# Patient Record
Sex: Male | Born: 1955 | Race: Black or African American | Hispanic: No | Marital: Married | Smoking: Former smoker
Health system: Southern US, Community
[De-identification: ages and names within clinical notes are randomized; demographics above are authoritative.]

## PROBLEM LIST (undated history)

## (undated) DIAGNOSIS — I255 Ischemic cardiomyopathy: Secondary | ICD-10-CM

## (undated) DIAGNOSIS — E669 Obesity, unspecified: Secondary | ICD-10-CM

## (undated) DIAGNOSIS — Z720 Tobacco use: Secondary | ICD-10-CM

## (undated) DIAGNOSIS — M109 Gout, unspecified: Secondary | ICD-10-CM

## (undated) DIAGNOSIS — I119 Hypertensive heart disease without heart failure: Secondary | ICD-10-CM

## (undated) DIAGNOSIS — I779 Disorder of arteries and arterioles, unspecified: Secondary | ICD-10-CM

## (undated) DIAGNOSIS — I739 Peripheral vascular disease, unspecified: Secondary | ICD-10-CM

## (undated) DIAGNOSIS — E785 Hyperlipidemia, unspecified: Secondary | ICD-10-CM

## (undated) DIAGNOSIS — N189 Chronic kidney disease, unspecified: Secondary | ICD-10-CM

## (undated) DIAGNOSIS — I5042 Chronic combined systolic (congestive) and diastolic (congestive) heart failure: Secondary | ICD-10-CM

## (undated) DIAGNOSIS — J449 Chronic obstructive pulmonary disease, unspecified: Secondary | ICD-10-CM

## (undated) DIAGNOSIS — I4819 Other persistent atrial fibrillation: Secondary | ICD-10-CM

## (undated) DIAGNOSIS — E119 Type 2 diabetes mellitus without complications: Secondary | ICD-10-CM

## (undated) DIAGNOSIS — I10 Essential (primary) hypertension: Secondary | ICD-10-CM

## (undated) DIAGNOSIS — I639 Cerebral infarction, unspecified: Secondary | ICD-10-CM

## (undated) DIAGNOSIS — I2510 Atherosclerotic heart disease of native coronary artery without angina pectoris: Secondary | ICD-10-CM

## (undated) HISTORY — DX: Gout, unspecified: M10.9

## (undated) HISTORY — DX: Chronic kidney disease, unspecified: N18.9

## (undated) HISTORY — DX: Tobacco use: Z72.0

## (undated) HISTORY — DX: Obesity, unspecified: E66.9

## (undated) HISTORY — DX: Essential (primary) hypertension: I10

## (undated) HISTORY — DX: Chronic obstructive pulmonary disease, unspecified: J44.9

## (undated) HISTORY — DX: Hyperlipidemia, unspecified: E78.5

---

## 2007-07-01 HISTORY — PX: CARDIAC CATHETERIZATION: SHX172

## 2007-07-01 HISTORY — PX: CORONARY ANGIOPLASTY: SHX604

## 2008-06-30 HISTORY — PX: CARDIAC CATHETERIZATION: SHX172

## 2008-09-12 ENCOUNTER — Inpatient Hospital Stay: Payer: Self-pay | Admitting: Internal Medicine

## 2010-10-16 ENCOUNTER — Inpatient Hospital Stay: Payer: Self-pay | Admitting: Surgery

## 2010-10-18 LAB — PATHOLOGY REPORT

## 2012-01-14 ENCOUNTER — Inpatient Hospital Stay: Payer: Self-pay | Admitting: Internal Medicine

## 2012-01-14 DIAGNOSIS — R079 Chest pain, unspecified: Secondary | ICD-10-CM

## 2012-01-14 DIAGNOSIS — I369 Nonrheumatic tricuspid valve disorder, unspecified: Secondary | ICD-10-CM

## 2012-01-14 LAB — COMPREHENSIVE METABOLIC PANEL
Albumin: 3.7 g/dL (ref 3.4–5.0)
Anion Gap: 10 (ref 7–16)
BUN: 12 mg/dL (ref 7–18)
Calcium, Total: 9.2 mg/dL (ref 8.5–10.1)
Co2: 29 mmol/L (ref 21–32)
EGFR (African American): >60
EGFR (Non-African Amer.): >60
Glucose: 89 mg/dL (ref 65–99)
Osmolality: 282 (ref 275–301)
Potassium: 3.7 mmol/L (ref 3.5–5.1)
Sodium: 142 mmol/L (ref 136–145)

## 2012-01-14 LAB — CBC
HCT: 41.8 % (ref 40.0–52.0)
HGB: 13.6 g/dL (ref 13.0–18.0)
Platelet: 155 10*3/uL (ref 150–440)
RBC: 4.74 10*6/uL (ref 4.40–5.90)
RDW: 14.8 % — ABNORMAL HIGH (ref 11.5–14.5)
WBC: 6.4 10*3/uL (ref 3.8–10.6)

## 2012-01-14 LAB — PROTIME-INR: Prothrombin Time: 13.3 secs (ref 11.5–14.7)

## 2012-01-14 LAB — CK TOTAL AND CKMB (NOT AT ARMC)
CK, Total: 179 U/L (ref 35–232)
CK, Total: 158 U/L (ref 35–232)
CK-MB: 1.9 ng/mL (ref 0.5–3.6)
CK-MB: 2.2 ng/mL (ref 0.5–3.6)

## 2012-01-14 LAB — APTT: Activated PTT: 40.7 secs — ABNORMAL HIGH (ref 23.6–35.9)

## 2012-01-14 LAB — DRUG SCREEN, URINE
Benzodiazepine, Ur Scrn: NEGATIVE (ref ?–200)
Cannabinoid 50 Ng, Ur ~~LOC~~: NEGATIVE (ref ?–50)
Cocaine Metabolite,Ur ~~LOC~~: NEGATIVE (ref ?–300)
MDMA (Ecstasy)Ur Screen: NEGATIVE (ref ?–500)
Methadone, Ur Screen: NEGATIVE (ref ?–300)
Opiate, Ur Screen: NEGATIVE (ref ?–300)
Phencyclidine (PCP) Ur S: NEGATIVE (ref ?–25)
Tricyclic, Ur Screen: NEGATIVE (ref ?–1000)

## 2012-01-14 LAB — TROPONIN I
Troponin-I: 0.04 ng/mL
Troponin-I: 0.03 ng/mL

## 2012-01-14 LAB — URIC ACID: Uric Acid: 7.4 mg/dL — ABNORMAL HIGH (ref 3.5–7.2)

## 2012-01-15 LAB — LIPID PANEL
HDL Cholesterol: 54 mg/dL (ref 40–60)
Ldl Cholesterol, Calc: 152 mg/dL — ABNORMAL HIGH (ref 0–100)
VLDL Cholesterol, Calc: 21 mg/dL (ref 5–40)

## 2012-01-16 LAB — COMPREHENSIVE METABOLIC PANEL
Albumin: 3.4 g/dL (ref 3.4–5.0)
Anion Gap: 10 (ref 7–16)
BUN: 25 mg/dL — ABNORMAL HIGH (ref 7–18)
Bilirubin,Total: 0.3 mg/dL (ref 0.2–1.0)
Calcium, Total: 9.5 mg/dL (ref 8.5–10.1)
Chloride: 104 mmol/L (ref 98–107)
Creatinine: 1.26 mg/dL (ref 0.60–1.30)
EGFR (African American): >60
Potassium: 4.5 mmol/L (ref 3.5–5.1)
SGPT (ALT): 32 U/L
Total Protein: 7.7 g/dL (ref 6.4–8.2)

## 2012-01-16 LAB — CBC WITH DIFFERENTIAL/PLATELET
Basophil #: 0 10*3/uL (ref 0.0–0.1)
Basophil %: 0.1 %
Eosinophil %: 0 %
HCT: 42.5 % (ref 40.0–52.0)
HGB: 13.9 g/dL (ref 13.0–18.0)
Lymphocyte #: 1.1 10*3/uL (ref 1.0–3.6)
Lymphocyte %: 5.8 %
MCH: 28.9 pg (ref 26.0–34.0)
MCV: 88 fL (ref 80–100)
Monocyte %: 2.1 %
Neutrophil #: 17.9 10*3/uL — ABNORMAL HIGH (ref 1.4–6.5)
Neutrophil %: 92 %
RBC: 4.81 10*6/uL (ref 4.40–5.90)

## 2012-12-08 ENCOUNTER — Inpatient Hospital Stay: Payer: Self-pay | Admitting: Internal Medicine

## 2012-12-08 DIAGNOSIS — I10 Essential (primary) hypertension: Secondary | ICD-10-CM

## 2012-12-08 DIAGNOSIS — R079 Chest pain, unspecified: Secondary | ICD-10-CM

## 2012-12-08 LAB — TROPONIN I: Troponin-I: 0.09 ng/mL — ABNORMAL HIGH

## 2012-12-08 LAB — URINALYSIS, COMPLETE
Bilirubin,UR: NEGATIVE
Blood: NEGATIVE
Glucose,UR: NEGATIVE mg/dL (ref 0–75)
Ketone: NEGATIVE
Leukocyte Esterase: NEGATIVE
Nitrite: NEGATIVE
Ph: 5 (ref 4.5–8.0)
Protein: 100
RBC,UR: 2 /HPF (ref 0–5)
Specific Gravity: 1.028 (ref 1.003–1.030)
Squamous Epithelial: 2
WBC UR: 3 /HPF (ref 0–5)

## 2012-12-08 LAB — COMPREHENSIVE METABOLIC PANEL
Albumin: 3.6 g/dL (ref 3.4–5.0)
Alkaline Phosphatase: 143 U/L — ABNORMAL HIGH (ref 50–136)
BUN: 15 mg/dL (ref 7–18)
Calcium, Total: 9.2 mg/dL (ref 8.5–10.1)
Chloride: 104 mmol/L (ref 98–107)
Co2: 25 mmol/L (ref 21–32)
EGFR (African American): >60
EGFR (Non-African Amer.): >60
SGPT (ALT): 60 U/L (ref 12–78)
Total Protein: 7.5 g/dL (ref 6.4–8.2)

## 2012-12-08 LAB — CBC
HGB: 13.7 g/dL (ref 13.0–18.0)
MCV: 86 fL (ref 80–100)
RBC: 4.84 10*6/uL (ref 4.40–5.90)
RDW: 16.6 % — ABNORMAL HIGH (ref 11.5–14.5)
WBC: 10.1 10*3/uL (ref 3.8–10.6)

## 2012-12-08 LAB — CK TOTAL AND CKMB (NOT AT ARMC): CK-MB: 4.2 ng/mL — ABNORMAL HIGH (ref 0.5–3.6)

## 2012-12-08 LAB — DRUG SCREEN, URINE
Amphetamines, Ur Screen: NEGATIVE (ref ?–1000)
Barbiturates, Ur Screen: NEGATIVE (ref ?–200)
Benzodiazepine, Ur Scrn: NEGATIVE (ref ?–200)
Opiate, Ur Screen: NEGATIVE (ref ?–300)

## 2012-12-09 ENCOUNTER — Telehealth: Payer: Self-pay

## 2012-12-09 LAB — LIPID PANEL
Cholesterol: 191 mg/dL (ref 0–200)
HDL Cholesterol: 54 mg/dL (ref 40–60)
Ldl Cholesterol, Calc: 121 mg/dL — ABNORMAL HIGH (ref 0–100)
Triglycerides: 78 mg/dL (ref 0–200)
VLDL Cholesterol, Calc: 16 mg/dL (ref 5–40)

## 2012-12-09 NOTE — Telephone Encounter (Signed)
Message copied by [facility], Quintavius Niebuhr E on Thu Dec 09, 2012  2:24 PM ------      Message from: Christell Faith      Created: Thu Dec 09, 2012  1:47 PM      Regarding: TCM/ph       July 1 10:30 Dr. Mariah Milling ------

## 2012-12-09 NOTE — Telephone Encounter (Signed)
tcm

## 2012-12-09 NOTE — Telephone Encounter (Signed)
TCM #1 6/13 

## 2012-12-10 NOTE — Telephone Encounter (Signed)
Patient contacted regarding discharge from [facility] on 12/09/12.  Patient understands to follow up with provider Dr. Mariah Milling on 12/28/12 at 1030 at [facility]. Patient understands discharge instructions? yes Patient understands medications and regiment? yes Patient understands to bring all medications to this visit? yes  Pt non compliant with meds Says he has not gotten BP meds filled per d/c instructions Says he will get these filled at [facility] when he goes next week I explained he needs to go ahead and start these ASAP and offered to send in RX to local pharm He says these meds cost $3-4 each but are free at [place] and he has "no money right now" He understands risk and is willing to accept risk He will go to ER should he develop CP, sob, etc

## 2012-12-11 ENCOUNTER — Emergency Department: Payer: Self-pay | Admitting: Emergency Medicine

## 2012-12-11 LAB — CBC
HCT: 38.4 % — ABNORMAL LOW
HGB: 12.5 g/dL — ABNORMAL LOW
MCH: 27.8 pg
MCHC: 32.5 g/dL
MCV: 85 fL
Platelet: 167 x10 3/mm 3
RBC: 4.49 x10 6/mm 3
RDW: 16.5 % — ABNORMAL HIGH
WBC: 7.8 x10 3/mm 3

## 2012-12-11 LAB — COMPREHENSIVE METABOLIC PANEL WITH GFR
Albumin: 3.3 g/dL — ABNORMAL LOW
Alkaline Phosphatase: 134 U/L
Anion Gap: 5 — ABNORMAL LOW
BUN: 18 mg/dL
Bilirubin,Total: 0.6 mg/dL
Calcium, Total: 8.8 mg/dL
Chloride: 108 mmol/L — ABNORMAL HIGH
Co2: 28 mmol/L
Creatinine: 1.26 mg/dL
EGFR (African American): >60
EGFR (Non-African Amer.): >60
Glucose: 112 mg/dL — ABNORMAL HIGH
Osmolality: 284
Potassium: 4.2 mmol/L
SGOT(AST): 62 U/L — ABNORMAL HIGH
SGPT (ALT): 77 U/L
Sodium: 141 mmol/L
Total Protein: 6.7 g/dL

## 2012-12-11 LAB — CK TOTAL AND CKMB (NOT AT ARMC)
CK, Total: 126 U/L
CK-MB: 3.7 ng/mL — ABNORMAL HIGH

## 2012-12-11 LAB — TROPONIN I: Troponin-I: 0.09 ng/mL — ABNORMAL HIGH

## 2012-12-11 LAB — PRO B NATRIURETIC PEPTIDE: B-Type Natriuretic Peptide: 2826 pg/mL — ABNORMAL HIGH

## 2012-12-13 ENCOUNTER — Telehealth: Payer: Self-pay

## 2012-12-13 NOTE — Telephone Encounter (Signed)
See below

## 2012-12-13 NOTE — Telephone Encounter (Signed)
Per family member pt not in He has our phone # [phone]

## 2012-12-13 NOTE — Telephone Encounter (Signed)
Message copied by Marcelle Overlie on Mon Dec 13, 2012  8:35 AM ------      Message from: Lorine Bears A      Created: Sat Dec 11, 2012  4:20 PM       He went to the ED on Saturday with increased dyspnea and elevated BNP. He was discharged with addition of Lasix 40 mg daily and K-dur 20 meq daily. He needs to have BMP in 5 days and Follow up within 1 week (maybe with Alinda Money).  ------

## 2012-12-13 NOTE — Telephone Encounter (Signed)
"  pt not home" Will try again later

## 2012-12-16 ENCOUNTER — Other Ambulatory Visit: Payer: Self-pay

## 2012-12-16 DIAGNOSIS — I5021 Acute systolic (congestive) heart failure: Secondary | ICD-10-CM

## 2012-12-16 NOTE — Telephone Encounter (Signed)
Pt's family member informed of need for labs tomm She says she will give pt message and if he cannot make it she will call me back

## 2012-12-17 ENCOUNTER — Other Ambulatory Visit: Payer: Medicare Other

## 2012-12-20 ENCOUNTER — Telehealth: Payer: Self-pay

## 2012-12-20 NOTE — Telephone Encounter (Signed)
Remind pt he is due for bmp

## 2012-12-20 NOTE — Telephone Encounter (Signed)
Reminded pt he is due for BMP He will come today or tomm for this He confirms he is taking lasix and KCL as prescribed I attempted to schedule appt with [place] this week, but there are no openings, PA not here this week Pt has appt with [place] 7/1 for hospital f/u He will keep this appt. He will come for labs today/tomm and we will go from there Understanding verb

## 2012-12-20 NOTE — Telephone Encounter (Signed)
FYI

## 2012-12-21 ENCOUNTER — Ambulatory Visit (INDEPENDENT_AMBULATORY_CARE_PROVIDER_SITE_OTHER): Payer: Medicare Other

## 2012-12-21 DIAGNOSIS — I509 Heart failure, unspecified: Secondary | ICD-10-CM

## 2012-12-21 DIAGNOSIS — I5021 Acute systolic (congestive) heart failure: Secondary | ICD-10-CM

## 2012-12-22 LAB — BASIC METABOLIC PANEL
BUN: 16 mg/dL (ref 6–24)
Chloride: 105 mmol/L (ref 97–108)
Creatinine, Ser: 1.14 mg/dL (ref 0.76–1.27)
GFR calc Af Amer: 83 mL/min/{1.73_m2} (ref >59)
GFR calc non Af Amer: 72 mL/min/{1.73_m2} (ref >59)
Glucose: 124 mg/dL — ABNORMAL HIGH (ref 65–99)

## 2012-12-27 NOTE — Addendum Note (Signed)
Addended by: Festus Aloe on: 12/27/2012 09:17 AM   Modules accepted: Orders

## 2012-12-28 ENCOUNTER — Encounter: Payer: Medicare Other | Admitting: Cardiovascular Disease

## 2013-01-11 ENCOUNTER — Encounter: Payer: Medicare Other | Admitting: Cardiovascular Disease

## 2014-10-17 NOTE — Consult Note (Signed)
General Aspect 59 year old male with history of coronary artery disease with stent placement at [facility] in August 2009, hypertension uncontrolled, hyperlipidemia, and a smoker, increasing SOB x 5 to 6 weeks, chest discomfort. Cardiology was consulted for chest pain.  he hass had previous epsiodes of chest pain in 2010 with cardiac cath at [facility] at that time. He continues to smoking 1 ppd. He reports increasing SOB over one month with wheezing. Chest feels tight at times. Inhalers are not wokrin anymore. He stopped his advair, only taking combivent. No diaphoresis, no nausea/vomiting, no PND, orthopena    Present Illness . SOCIAL HISTORY: He smokes about 1 ppd.  Occasional alcohol use. He denies any illicit drug use.   FAMILY HISTORY: Positive for coronary artery disease. His mom died of a heart attack before the age of 58.   Physical Exam:   GEN well developed, well nourished, no acute distress, obese    HEENT red conjunctivae    NECK supple  No masses    RESP normal resp effort  wheezing  decreased BS b/l    ABD denies tenderness  soft    LYMPH negative neck    EXTR negative edema    SKIN normal to palpation    NEURO motor/sensory function intact    PSYCH alert, A+O to time, place, person, good insight   Review of Systems:   Subjective/Chief Complaint SOB, wheezing, chest is tight sometimes    General: No Complaints    Skin: No Complaints    ENT: No Complaints    Eyes: No Complaints    Neck: No Complaints    Respiratory: Short of breath  Wheezing    Cardiovascular: Dyspnea    Gastrointestinal: No Complaints    Genitourinary: No Complaints    Vascular: No Complaints    Musculoskeletal: No Complaints    Neurologic: No Complaints    Hematologic: No Complaints    Endocrine: No Complaints    Psychiatric: No Complaints    Review of Systems: All other systems were reviewed and found to be negative    Medications/Allergies Reviewed Medications/Allergies  reviewed     asthma:    Back Pain, Chronic:    Back Pain, Chronic:    Cardiac Cath: NO STENTS PLACED, 13-Sep-2008   Hypercholesterolemia:    HTN:    Carotid Stent Placement:   Home Medications: Medication Instructions Status  hydrochlorothiazide 25 mg oral tablet 0.5 tab(s) orally once a day for blood pressure Active  amlodipine 10 mg oral tablet 1 tab(s) orally once a day for blood pressure Active  Combivent 18 mcg-103 mcg/inh inhalation aerosol with adapter 2 puff(s) inhaled every 4 hours, As Needed for breathing Active  isosorbide mononitrate 60 mg oral tablet, extended release 1 tab(s) orally once a day (don't take cialis, viagra, or levitra while you are on this medication) Active  metoprolol tartrate 100 mg oral tablet 1 tab(s) orally 2 times a day for blood pressure Active  atorvastatin 40 mg oral tablet 0.5 tab(s) orally once a day (at bedtime) for cholesterol (replaces rosuvastatin) Active   EKG:   Interpretation EKG shows NSR with T wave ABN in V5, V6, I and AVL (seen before)    No Known Allergies:   Vital Signs/Nurse's Notes: **Vital Signs.:   17-Jul-13 08:12   Temperature Temperature (F) 98.5   Celsius 36.9   Pulse Pulse 92   Respirations Respirations 14   Systolic BP Systolic BP 197   Diastolic BP (mmHg) Diastolic BP (mmHg) 94  Mean BP 128   Pulse Ox % Pulse Ox % 100   Pulse Ox Activity Level  At rest   Oxygen Delivery Room Air/ 21 %   Pulse Ox Heart Rate 92     Impression 59 y/o [place] American male with hx of COPD, continued smoking 1 ppd, CAD and severe hypertension. Presented with 6 weeks hx of SOB, wheezing and recurrent chest pain.  BNP is slightly elevated. Troponin is elevated at 0.08. EKG is NSR with ST T abnormalities but no change compared to EKG of 4/ 2012.   1- SOB,  Suspect secondary to COPD exacerbation: No clinical signs of fluid overload. echo pending to confirm RVSP Very wheezy on exam. --May need continued nebs, steroids  2-   chest pain Cardiac enz negative so far EKG unchanged Could be Angina could be secondary to severe HTN and SOB from COPD If enz stay negative, consider outpt stress test (unable to currently treadmill or lexiscan) Hold heparin if next enz are negative  3- Severe HTN uncontrolled on meds, titrating  4-  CAD s/p stents asa, Cautious with b-blockers given copd exacerbation (bronchospasm)  5- copd/ Tobacco abuse ONLY ON COMBIVENT "ADVAIR DID NOT WORK"  6- Non compliance   Electronic Signatures: Julien Nordmann (MD)  (Signed 17-Jul-13 09:04)  Authored: General Aspect/Present Illness, History and Physical Exam, Review of System, Past Medical History, Home Medications, EKG , Allergies, Vital Signs/Nurse's Notes, Impression/Plan   Last Updated: 17-Jul-13 09:04 by Julien Nordmann (MD)

## 2014-10-17 NOTE — Discharge Summary (Signed)
PATIENT NAME:  [name], [name] MR#:  [id number] DATE OF BIRTH:  Nov 01, 1955  DATE OF ADMISSION:  01/14/2012 DATE OF DISCHARGE:  01/16/2012  ADMITTING DIAGNOSIS: Chronic obstructive pulmonary disease exacerbation.   DISCHARGE DIAGNOSES:  1. Acute respiratory failure.  2. Chronic obstructive pulmonary disease exacerbation. 3. Acute bronchitis.  4. Chest pain, likely chest wall pain versus gastroesophageal reflux disease. Cardiac evaluation is pending.  5. Normal echocardiogram.  6. Elevated troponin, but no cardiac injury per cardiology.  7. Malignant hypertension.  8. Tobacco abuse.  9. Gout. 10. Hyperlipidemia.  11. Obesity.  12. Constipation.  13. Intermittent nausea and vomiting, resolved.   DISCHARGE CONDITION: Stable.   DISCHARGE MEDICATIONS:  1. Hydrochlorothiazide 12.5 mg p.o. daily.  2. Amlodipine 10 mg p.o. daily.  3. Combivent 2 puffs every four hours as needed.  4. Atorvastatin 40 mg, half tablet, which is 20 mg p.o. at bedtime.  5. Aspirin 81 mg p.o. daily.  6. Plavix 75 mg p.o. daily.  7. Nitroglycerin 0.4 mg sublingually every five minutes as needed.  8. Indocin 60 mg p.o. daily.  9. Nicotine patch 7 mg topically daily.  10. Nicotine oral inhaler one cartridge every one hour as needed.  11. Allopurinol 300 mg p.o. daily.  12. Metoprolol 150 mg p.o. twice daily. This is a new dose.  13. Lisinopril 40 mg p.o. daily.  14. Zithromax 250 mg daily for four more days.  15. Maalox 30 mL every four hours as needed.  16. Clonidine 0.1 mg p.o. twice daily.  17. Zofran 4 mg p.o. every four hours as needed.  18. Percocet 5/325 mg 1-2 tablets every six hours as needed.  19. Advair Diskus 250/50, 1 puff twice daily.  20. Prednisone 60 mg p.o. on 01/17/2012, then taper x 10 mg until stopped.  DIET: Two-gram salt, low fat, low cholesterol.   PHYSICAL ACTIVITY LIMITATIONS: As tolerated.   REFERRAL: Home Health.  FOLLOWUP: [facility] week  after discharge.    CONSULTANTS:  1. Dr. Mariah MillingGollan, cardiology.  2. Dr. Kirke CorinArida, cardiology. 3. Child psychotherapistocial worker.   RADIOLOGIC STUDIES:  1. Chest x-ray PA and lateral 01/13/2012 showed no acute disease of the chest.  2. Abdomen, flat and erect x-ray on 01/15/2012 showed a small amount of free extraluminal gas in the right mid and upper abdomen. Bowel gas pattern suggests constipation. No evidence of obstruction.  If patient's clinical examination is worse and for possible perforated viscus, CT scanning would be useful according to the radiologist.  3. CT scan of abdomen and pelvis without contrast 01/15/2012 revealed no CT evidence of obstructive or inflammatory abnormalities. There is no evidence of intraperitoneal free air. Incidentally noted fat-containing umbilical hernia.   HISTORY OF PRESENT ILLNESS: The patient is a 59 year old African American male with past medical history significant for history of smoking and chronic obstructive pulmonary disease who presented to the hospital with complaints of 1 to 2 months of shortness of breath, also wheezing, worsening over the past few days, associated with some chest pains in the midsternal area. The pain is described as sharp pain. Also shortness of breath upon walking. On arrival to the Emergency Room, the patient's blood pressure was 197/94, respiratory rate 19, pulse 86, temperature 98.8. Oxygen saturation 96% on oxygen therapy. Physical exam revealed bilateral wheezing. EKG showed sinus rhythm at 100 beats per minute, incomplete right bundle branch block, poor progression of R waves in anterior chest leads, and ST-T wave abnormalities with T-wave inversions in  lateral leads. However, no significant change since prior EKG done in April 2012.   LABORATORY DATA:  Labs on 01/14/2012 showed elevated beta-type natriuretic peptide of 829. Otherwise BMP was unremarkable. The patient's liver enzymes were normal. Cardiac enzymes, first set showed troponin elevation  to 0.08. However, second set and third set were normal. Urine drug screen was negative. CBC was within normal limits. Coagulation panel was unremarkable. The patient's EKG was already mentioned.  Chest x-ray was also unremarkable.   HOSPITAL COURSE: The patient was admitted to the hospital for further evaluation. He was admitted because of his shortness of breath, wheezing, and elevated troponin. He was admitted to the unit with telemetry. He was started on heparin IV because of his chest pains and elevated troponin with concerns of possible acute coronary syndrome. However, when patient's second and third sets of troponin came back normal, it was felt that the patient's elevation of troponin was nonspecific and not related to acute coronary syndrome. The patient was evaluated by Dr. Mariah Milling and Dr. Kirke Corin and had an echocardiogram. Echocardiogram was performed on 01/14/2012. It showed left ventricular systolic function normal. Ejection fraction equal or more than 55%. Transmitral spectral Doppler flow pattern was suggestive of impaired left ventricular relaxation with moderate concentric left ventricular hypertrophy. Right ventricular systolic function was normal. Left atrium was mildly dilated. Right ventricular systolic pressure was also found to be normal.   The patient was started on steroids and inhalers and his condition improved. He was wheezing less, however still complaining of some discomfort in his chest. It was felt that the patient would benefit from stress test initially as inpatient. Stress test was ordered for him on 01/16/2012. However, the patient had some chest pains at night while he was lying in bed and the stress test was canceled. I discussed the patient's condition with Dr. Mariah Milling, cardiologist, who felt that the patient should undergo a stress test as an outpatient after chronic obstructive pulmonary disease exacerbation has resolved. Dr. Mariah Milling felt that the patient could not do a  treadmill because of his joint pains. However, LexiScan could cause bronchospasm and worsen his chronic obstructive pulmonary disease. Dobutamine would cause significant tachycardia and exacerbate shortness of breath. So he felt that postponing the stress test would be prudent. This was discussed with the patient. The patient is to continue beta blocker, aspirin, nitroglycerin intermittently as needed as well as his blood pressure medications to control his blood pressure according to Dr. Mariah Milling.   In regards to acute respiratory failure, it was felt to be due to chronic obstructive pulmonary disease exacerbation and acute bronchitis. The patient is to continue antibiotic therapy to finish the course and he is to continue steroids as well as inhalation therapy. D-dimer was checked and was found to be normal at 0.24.   In regards to chest pains, it was unclear if the patient had chest wall pain due to chronic obstructive pulmonary disease exacerbation or gastroesophageal reflux disease.  However, the patient's is to undergo cardiac evaluation as an outpatient. He had an echocardiogram which was normal and that decreases his likelihood of having significant abnormalities in his coronaries. His elevated troponin was not felt to be cardiac injury or acute coronary syndrome.  In regards to malignant hypertension, the patient's blood pressure medications were advanced to current levels. The patient's blood pressure was much better controlled on the day of discharge.  On the day of discharge the patient's temperature is 98.2, pulse 67, respiration rate 18, blood pressure  134/67, saturation 96% to 99% on room air. The patient is to follow up with the [facility] and discuss advancement of blood pressure medications if needed.   For hyperlipidemia the patient is to continue atorvastatin. The patient's lipid panel was checked and was found to be still significantly abnormal. LDL was found to be elevated at 152,  total cholesterol 227, triglycerides 103, and HDL 54. The patient was advised to continue very strict, low fat, low cholesterol diet. It is recommended to advance the patient's Lipitor dose if needed.  The patient was complaining of some abdominal discomfort. His x-ray revealed constipation. The patient was advised to continue medications to improve his constipation. He was having some intermittent nausea and vomiting. However, those resolved upon discharge. The patient is to continue if needed Zofran.  In regards to smoking, the patient was counseled numerous times and actually daily and nicotine replacement therapy was prescribed for him upon discharge.   TIME SPENT: 40 minutes. ____________________________ Katharina Caper, MD rv:bjt D:  01/16/2012 18:21:02 ET         T: 01/19/2012 09:27:28 ET         JOB#: [id number]  cc: [facility] Darleene Cumpian MD ELECTRONICALLY SIGNED 01/21/2012 16:23

## 2014-10-17 NOTE — H&P (Signed)
PATIENT NAME:  [name], [name] MR#:  [id number] DATE OF BIRTH:  [birth year]  DATE OF ADMISSION:  01/14/2012  PRIMARY CARE PHYSICIAN: [facility]   CHIEF COMPLAINT: Shortness of breath and chest pain.  HISTORY OF PRESENT ILLNESS: Mr. Born is a 59 year old African-American male with a history of chronic obstructive pulmonary disease and history of coronary artery disease. He states that for the last 1-1/2 months he is having shortness of breath and wheezing, getting worse especially the last few days, associated with chest pain located on either side of the mid sternum. The pain is described as sharp pain. The intensity is moderate. The pain and shortness of breath get worse upon walking. The patient is noncompliant with his medications. I understood he is supposed to be on aspirin, but he has not been taking it, neither the nitroglycerin. I am not sure about his compliance with the other medications as he does not volunteer information unless he is pressured to do so. On the other hand, he continues to smoke despite his medical history.  Additionally, I would like to mention that in 2010 he was admitted here and he had a cardiac catheterization which showed two-vessel disease, and he is supposed to have further intervention upon follow up with Dr. Kirke Corin.  However, the patient tells me that he did not follow up.    REVIEW OF SYSTEMS: CONSTITUTIONAL: He denies any fever. No chills. No night sweats, but reports fatigue. EYES: No blurring of vision, no double vision.  ENT: No hearing impairment, no sore throat, no dysphagia. CARDIOVASCULAR: He reports chest pain and shortness of breath and mild ankle edema in the right more than the left. No syncope. RESPIRATORY: He reports shortness of breath, wheezing and chest pain. No cough. GASTROINTESTINAL: No abdominal pain, no vomiting or diarrhea.  GENITOURINARY: No dysuria, no frequency of urination.  MUSCULOSKELETAL: No joint pain or swelling.  No muscular  pain or swelling.  INTEGUMENTARY: No skin rash, no ulcers.  NEUROLOGICAL:  No focal weakness, no seizure activity, no headache.  PSYCHIATRIC: No anxiety, no depression.  ENDOCRINE:  No polyuria or polydipsia. No heat or cold intolerance.   PAST MEDICAL HISTORY:  1. Chronic obstructive pulmonary disease. 2. Ongoing tobacco abuse.  3. Coronary artery disease. He had three Bare Metal Stents placed at [facility] in August of 2009.  4. In March of 2010, he was admitted to this hospital with chest pain, and he had cardiac catheterization by Dr. Mady Gemma and was found to have significant two-vessel coronary artery disease, moderate proximal right coronary artery lesion. The stents in the RCA are patent without significant stenosis.  LAD disease is distal. No revascularization was advised.  It was determined at that time that likely his chest pain was supply-demand ischemia.  5. His other medical issues also include severe systemic hypertension and hyperlipidemia.    PAST SURGICAL HISTORY:  1. Coronary stents.  2. Appendectomy last year done in this hospital.   FAMILY HISTORY: His mother died at the age of 91 from a heart attack.  He does not have information about his father.  He said he does not know him.   SOCIAL HABITS: Chronic smoker, he cut down smoking to about 2 cigarettes a day.  He has been smoking since the age of 82. He still drinks alcohol, primarily liquor occasionally.  The last drink, he states, was three months ago.   SOCIAL HISTORY: The patient is unemployed.  He lives on Disability.  ADMISSION MEDICATIONS:  1. Metoprolol 100 mg b.i.d.   2. Isosorbide mononitrate 60 mg once a day. The patient is not taking it.  3. Hydrochlorothiazide 25 mg, taking 1/2 tab a day.  4. Combivent inhaler p.r.n.   5. Atorvastatin 40 mg once a day. He is taking 1/2 tablet a day.  6. Amlodipine 10 mg a day.  7. He is supposed to be on aspirin, and he is not taking it either.    ALLERGIES: No known drug allergies.   PHYSICAL EXAMINATION:  VITAL SIGNS: Blood pressure 197/94, respiratory rate 19, pulse 86, temperature 98.8. Oxygen saturation was 96%.   GENERAL APPEARANCE: A middle-aged male lying in bed in no acute distress.   HEENT: Head: No pallor, no icterus, no cyanosis.  Ears, nose and throat: Hearing was normal. Nasal mucosa, lips and tongue were normal. Eyes: Examination revealed normal eyelids and conjunctivae. Pupils about 4 to 5 mm, equal and reactive to light.   NECK: Supple. Trachea at midline. No thyromegaly. No cervical lymphadenopathy. No masses.   HEART: Normal S1, S2.  No S3 or S4. No murmur. No gallop. No carotid bruits.   RESPIRATORY: Normal breathing pattern without use of accessory muscles. No rales. No wheezing.   ABDOMEN: Soft without tenderness.  No hepatosplenomegaly. No masses. No hernias.   SKIN: Examination revealed no ulcers, no subcutaneous nodules. There is no peripheral edema at the time of my examination.   MUSCULOSKELETAL: No joint swelling, no clubbing.   NEUROLOGICAL:  Cranial nerves II through XII were intact. No focal motor deficit.   PSYCHIATRIC: The patient is alert and oriented x3. Mood and affect were normal.   LABORATORY, DIAGNOSTIC AND RADIOLOGICAL DATA:  Chest x-ray showed mild cardiomegaly.  No consolidation. No effusion. There is mild prominence of pulmonary vasculature.  EKG showed normal sinus rhythm with rate of 100 per minute, incomplete right bundle branch block. Poor progression of R waves in the anterior chest leads. ST-T wave abnormalities with T wave inversion in the lateral leads. There is no significant change in comparison of this EKG with the EKG done 10/16/2010.  Serum glucose was 89. B-type natriuretic peptide was elevated at 829. BUN 12, sodium 142, potassium 3.7.  Liver function tests were normal.  Total CPK 179 with normal CK-MB fraction.  However, his troponin is elevated at 0.08.  CBC showed  a white count of 6,000, hemoglobin 13, hematocrit 41, platelet count 155.   ASSESSMENT:  1. Shortness of breath. This is either secondary to chronic obstructive pulmonary disease exacerbation or a combination of this in addition to congestive heart failure, especially with mild elevation of his B-type natriuretic peptide.  2. Chest pain, to be further evaluated. There is also mild elevation of troponin.  3. Mild elevation of troponin. This is either secondary to demand ischemia secondary to increased pulmonary efforts due to chronic obstructive pulmonary disease exacerbation and also uncontrolled severe hypertension, or simply secondary to non ST-elevation acute myocardial infarction.  4. Severe systemic hypertension.  5. Hyperlipidemia. 6. Documented coronary artery disease as above with history of stent implants.  7. Tobacco abuse.  8. Noncompliance.   PLAN: We will admit the patient to the telemetry unit. Follow up on cardiac enzymes. We will place him on heparin protocol, blood pressure control and utmost to resume his beta blocker, metoprolol, along with the amlodipine. He is also supposed to be on an ACE inhibitor, Lisinopril 20 mg in the past, and I will resume that. Obtain echocardiogram to assess  his left ventricular function to determine whether he has underlying congestive heart failure or not and also to evaluate any wall motion abnormalities.  Cardiology consultation.  I will give the patient one dose of intravenous Lasix due to his volume load, continue bronchodilator therapy. A small dose of IV Solu-Medrol. Resume aspirin and beta blocker. I advised the patient to quit smoking, and I offered a nicotine patch. We will give him a small dose, 7 mg a day.   TIME SPENT:   Time spent on evaluating this patient and reviewing his medical records took more than one hour.   ____________________________ Carney Corners. Rudene Re, MD amd:cbb D: 01/14/2012 02:11:43 ET T: 01/14/2012 08:37:19  ET JOB#: [id number]  cc: Carney Corners. Rudene Re, MD, <Dictator> [facility] Carney Corners Aser Nylund MD ELECTRONICALLY SIGNED 01/16/2012 4:55

## 2014-10-20 NOTE — H&P (Signed)
PATIENT NAME:  NTHONY, [name] MR#:  [id number] DATE OF BIRTH:  05-02-1956  DATE OF ADMISSION:  12/08/2012  PRIMARY CARE PHYSICIAN: [facility], which [facility] 18[facility], at [facility].   CHIEF COMPLAINT: Weakness and dizziness.   HISTORY OF PRESENT ILLNESS: [name] is a 59 year old African-American gentleman with history of CAD status post stent x4 at [facility], history of COPD and hypertension, who comes to the Emergency Room with complaints of weakness and dizziness. He takes Suboxone through the pain clinic in [place] which is called [facility] (their contact number is [phone]) on a daily basis. He ran out of his blood pressure medication for the last 2 months and comes to the Emergency Room with blood pressure of 197/109. Repeat blood pressure was 207/129. The patient also complains of constant chest pain on a regular basis. He has had stress test done last July with Dr. Mariah Milling as outpatient. I do not have the results of it. The patient was also noted to have borderline elevated troponin of 0.09. He is being admitted for accelerated hypertension, chronic chest pain and elevated troponins.   PAST MEDICAL HISTORY:  1. COPD with ongoing tobacco abuse.  2. CAD. The patient states he has had bare metal stents placed at [facility] in 2008. Last heart catheterization was done by [name] in March 2010. He was found to have significant 2-vessel CAD, moderate proximal right coronary artery lesion. The stents in RCA were patent without stenosis. LAD disease is distal. No revascularization was advised. Medical management was recommended.  3. History of hypertension.  4. Hyperlipidemia.  5. Ongoing alcohol abuse.   PAST SURGICAL HISTORY:  1. Appendectomy.  2. Coronary stents placed at [facility] in 2008, 2009.   FAMILY HISTORY: Mother died of heart attack at age 62.   SOCIAL HISTORY: Continues to smoke on a daily basis. He says he is down to  2 to 3 cigarettes per day. He also drinks alcohol, primarily liquor and beer. His last drink was 2 beers yesterday. The patient says he drinks hard liquor once a week, and he drinks heavily. He denies any history of DTs.   SOCIAL HISTORY: He is unemployed. Lives on disability.   MEDICATIONS:  1. Advair 250/50 one puff b.i.d. 2. He ran out of his blood pressure medications. He was supposed to be on Imdur, metoprolol, hydrochlorothiazide, amlodipine, lisinopril, clonidine.   ALLERGIES: No known drug allergies.   REVIEW OF SYSTEMS:  CONSTITUTIONAL: No fever. Positive for fatigue, weakness.  EYES: No blurred or double vision or glaucoma.  ENT: No tinnitus, ear pain, hearing loss.  RESPIRATORY: No cough, wheeze, hemoptysis or dyspnea.  CARDIOVASCULAR: Positive for chronic chest pain. Positive for hypertension. No palpitations or arrhythmia.  GASTROINTESTINAL: No nausea, vomiting, diarrhea, abdominal pain. Positive for GERD.  GENITOURINARY: No dysuria, hematuria or frequency.  ENDOCRINE: No polyuria, nocturia or thyroid problems.  HEMATOLOGY: No anemia or easy bruising.  SKIN: No acne or rash.  MUSCULOSKELETAL: Positive for chronic back pain and joint pain.  NEUROLOGIC: No CVA or TIA. No dysarthria or dementia.  PSYCHIATRIC: No anxiety or depression.  All other systems reviewed and negative.   PHYSICAL EXAMINATION:  GENERAL: The patient is awake, alert, oriented x3, not in acute distress.  VITAL SIGNS: He is afebrile, pulse is 88, blood pressure is 207/129, repeat is 181/96, sats are 98% on room air.  HEENT: Atraumatic, normocephalic. PERRLA. EOM intact. Oral mucosa is moist.  NECK:  Supple. No JVD. No carotid bruit.  RESPIRATORY: Clear to auscultation bilaterally. No rales, rhonchi, respiratory distress or labored breathing.  CARDIOVASCULAR: Both the heart sounds are normal. Rate and rhythm regular. PMI not lateralized. Chest is nontender. Good pedal pulses, good femoral pulses. No lower  extremity edema.  ABDOMEN: Soft but obese. Nontender. No organomegaly. Positive bowel sounds.  NEUROLOGIC: Grossly intact cranial nerves II through XII. No motor or sensory deficit.  PSYCHIATRIC: The patient is awake, alert, oriented x3.  SKIN: Warm and dry.   DIAGNOSTIC DATA: EKG shows sinus rhythm with T waves in inferolateral leads and Q waves in anterior leads. Left atrial enlargement. Chest x-ray consistent with mild to moderate COPD and mild cardiomegaly. Radiology reading says bilateral diffuse interstitial thickening likely representing interstitial edema versus interstitial pneumonitis. Cardiac enzymes: Troponin is 0.09, CK total is 331, MB is 4.8. CBC within normal limits. SGOT is 57. The rest of the electrolytes within normal limits.   ASSESSMENT: The 59 year old [name] with history of coronary artery disease, hypertension and history of chronic obstructive pulmonary disease, comes in with increasing weakness and dizziness. He is being admitted with:   1. Accelerated/malignant hypertension. The patient has been noncompliant to his blood pressure medications and has not followed up with his primary care physician. He continues to drink alcohol and continues with his smoking. His blood pressure on arrival in the Emergency Room was 207/129. He received nitro paste and IV labetalol. Will admit the patient to 2A, 2 gram sodium diet. Will start him on p.o. Toprol-XL along with clonidine, lisinopril and nitro paste, as he was supposed to be on these medications at home. The patient has been noncompliant with it. Will cycle cardiac enzymes x3. The case was discussed with [name], who will see the patient in consultation. He has had stress test with [name] as outpatient. I will hold off on ordering any stress test at this time until evaluated by [name].  2. Chronic chest pain. The patient has mild elevation of troponin. Could be in the setting of accelerated hypertension as well. He has  had stress test last year with [name]. His last heart catheterization was in March 2010, which showed patent stents in RCA. Distal LAD lesion and proximal RCA lesions were not amenable for any revascularization; hence, optimizing medical treatment was recommended. The patient has history of coronary artery disease x3 bare[facility]. Will continue to cycle his cardiac enzymes, give him aspirin, nitroglycerin and beta blockers along with lisinopril. Await cardiology consultation.  3. Hyperlipidemia. Will need to place the patient on statins.  4. Coronary artery disease status post stent in the past.  5. Chronic obstructive pulmonary disease with ongoing tobacco abuse. The patient advised smoking cessation, about 3 minutes spent for smoking cessation. The patient's saturations are stable at this time. He will continue his inhalers.  6. Deep vein thrombosis prophylaxis: Heparin subcutaneous t.i.d.  7. Further workup according to the patient's clinical course. Hospital admission plan was discussed with the patient.   TIME SPENT: 50 minutes. The case was discussed with [name].  ____________________________ Wylie HailSona A. Allena KatzPatel, MD sap:OSi D: 12/08/2012 09:50:12 ET T: 12/08/2012 11:13:13 ET JOB#: [id number]  cc: Laquiesha Piacente A. Allena KatzPatel, MD, <Dictator> Willow OraSONA A Edy Mcbane MD ELECTRONICALLY SIGNED 12/11/2012 14:34

## 2014-10-20 NOTE — Discharge Summary (Signed)
PATIENT NAME:  [name], [name] MR#:  [id number] DATE OF BIRTH:  07/25/1955  DATE OF ADMISSION:  12/08/2012 DATE OF DISCHARGE:  12/09/2012  PRESENTING COMPLAINT: Weakness and dizziness.   DISCHARGE DIAGNOSES:  1. Accelerated hypertension.  2. Coronary artery disease, status post stent in the past.  3. Medical noncompliance.  4. Chronic obstructive pulmonary disease.  5. Ongoing tobacco and alcohol abuse.   CONDITION ON DISCHARGE: Fair. Vitals stable.   MEDICATIONS:  1. Advair 250/50 one puff b.i.d.  2. Suboxone 8 mg/2 mg sublingual b.i.d.  3. Lisinopril 20 mg daily.  4. Clonidine 0.1 mg b.i.d.  5. Aspirin 81 mg daily.  6. Metoprolol succinate 50 mg extended-release b.i.d.  7. Albuterol inhaler 1 puff 4 times a day.  8. Nitroglycerin 0.4 mg sublingual as needed.  9. Lipitor 10 mg at bedtime.   DIET: Low sodium, low fat, low cholesterol diet.   DISCHARGE INSTRUCTIONS AND FOLLOWUP:  [facility]. [facility].  2. Follow up with Dr. Mariah MillingGollan in 2 to 4 weeks.  3. Stop smoking and drinking alcohol.   CONSULTATIONS: Cardiology consultation with Dr. Mariah MillingGollan and Dr. Kirke CorinArida.   LABORATORY DATA: LDL is 121, HDL is 54, cholesterol is 045191. Troponin is 0.10, 0.09. UA negative for UTI. Urine drug screen is negative. Chest x-ray: Bilateral interstitial diffuse thickening representing interstitial edema. CBC within normal limits. Comprehensive metabolic panel within normal limits except SGOT of 57.   BRIEF SUMMARY OF HOSPITAL COURSE: James CluckSamuel Blodgett is a 59 year old African-American gentleman with history of CAD, hypertension and tobacco abuse, who comes in with:   1. Accelerated/malignant hypertension. The patient has been noncompliant with his blood pressure medications, does not follow up with his primary care physician. He continues to drink alcohol and smoke. His blood pressure in the Emergency Room was 207/129. He was admitted on the telemetry floor, given nitro paste,  started on lisinopril, clonidine and metoprolol. Blood pressure remains stabilized, and the patient is recommended to continue these medications as outpatient and follow up with [facility] next [facility].  2. Chronic chest pain. Has stress test as outpatient. No further workup per Dr. Kirke CorinArida. Get better blood pressure control and followup as outpatient was recommended. Cardiac enzymes remained negative other than borderline leak of troponin, which was suspected due to accelerated hypertension.  3. Hyperlipidemia. Continued on Lipitor.  4. Coronary artery disease status post stent in the past. Nitroglycerin p.r.n. was continued along with aspirin.  5. Chronic obstructive pulmonary disease with ongoing tobacco abuse. The patient was advised on smoking cessation. He was given 1 dose of prednisone, and he will continue his inhalers.  6. Deep vein thrombosis prophylaxis. Heparin was used. Hospital stay otherwise remained stable.   CODE STATUS: The patient remained a full code.   TIME SPENT: 40 minutes.   ____________________________ Wylie HailSona A. Allena KatzPatel, MD sap:OSi D: 12/10/2012 07:12:18 ET T: 12/10/2012 07:54:40 ET JOB#: [id number]  cc: Charnetta Wulff A. Allena KatzPatel, MD, <Dictator> [facility] Willow OraSONA A Piper Albro MD ELECTRONICALLY SIGNED 12/11/2012 14:36

## 2014-10-20 NOTE — Consult Note (Signed)
General Aspect 59 year old male with history of coronary artery disease with stent placement at [facility] in August 2009, hypertension uncontrolled, COPD, hyperlipidemia, continued smoker, presenting with increasing weakness, dizziness, SOB, chest discomfort. 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 He ran out of his blood pressure medication for the last 2 months.   In the Emergency Room blood pressure was 197/109. Repeat blood pressure was 207/129. The patient also complains of constant chest pain on a regular basis.  It was recommended he have an outpt stress test after his previous admission in 2013. there is no record of this. He has been following up in the [place] clinic.   previous epsiodes of chest pain in 2010 with cardiac cath at [facility] at that time.   He continues to smoking 1 ppd. He reports increasing SOB over the past few weeks. Chest feels tight at times. Medication noncompliant. No diaphoresis, no nausea/vomiting, no PND, orthopena   Present Illness . SOCIAL HISTORY: He smokes about 1 ppd.  Occasional alcohol use. He denies any illicit drug use.   FAMILY HISTORY: Positive for coronary artery disease. His mom died of a heart attack before the age of 69.   Physical Exam:  GEN well developed, well nourished, no acute distress, obese   HEENT red conjunctivae   NECK supple  No masses   RESP normal resp effort   CARD Regular rate and rhythm  Normal, S1, S2  No murmur    ABD denies tenderness  soft   LYMPH negative neck   EXTR negative edema   SKIN normal to palpation   NEURO motor/sensory function intact   PSYCH alert, A+O to time, place, person, good insight   Review of Systems:  Subjective/Chief Complaint SOB, chest tightness, chest is tight sometimes   General: No Complaints   Skin: No Complaints   ENT: No Complaints   Eyes: No Complaints    Neck: No Complaints   Respiratory: Short of breath  Wheezing   Cardiovascular: Dyspnea   Gastrointestinal: No Complaints   Genitourinary: No Complaints   Vascular: No Complaints   Musculoskeletal: No Complaints   Neurologic: No Complaints   Hematologic: No Complaints   Endocrine: No Complaints   Psychiatric: No Complaints   Review of Systems: All other systems were reviewed and found to be negative   Medications/Allergies Reviewed Medications/Allergies reviewed   Lab Results:  Hepatic:  11-Jun-14 06:42   Bilirubin, Total  < 0.1  Alkaline Phosphatase  143  SGPT (ALT) 60  SGOT (AST)  57  Total Protein, Serum 7.5  Albumin, Serum 3.6  Routine Chem:  11-Jun-14 06:42   Result Comment TROPONIN - RESULTS VERIFIED BY REPEAT TESTING.  - CALLED RESULT TO [name] AT 6659  - 12/08/12-DAC  - READ-BACK PROCESS PERFORMED.  Result(s) reported on 08 Dec 2012 at 07:53AM.  Glucose, Serum  132  BUN 15  Creatinine (comp) 1.00  Sodium, Serum 136  Potassium, Serum 3.9  Chloride, Serum 104  CO2, Serum 25  Calcium (Total), Serum 9.2  Osmolality (calc) 275  eGFR (African American) >60  eGFR (Non-African American) >60 (eGFR values <59m/min/1.73 m2 may be an indication of chronic kidney disease (CKD). Calculated eGFR is useful in patients with stable renal function. The eGFR calculation will not be reliable in acutely ill patients when serum creatinine is changing rapidly. It is not useful in  patients on dialysis. The eGFR calculation may not be applicable to patients at the low and high extremes of body sizes, pregnant women, and vegetarians.)  Anion Gap 7  Cardiac:  11-Jun-14 06:42   CK, Total  331  CPK-MB, Serum  4.8 (Result(s) reported on 08 Dec 2012 at 07:19AM.)  Troponin I  0.09 (0.00-0.05 0.05 ng/mL or less: NEGATIVE  Repeat testing in 3-6 hrs  if clinically indicated. >0.05 ng/mL: POTENTIAL  MYOCARDIAL INJURY. Repeat  testing in 3-6 hrs if  clinically  indicated. NOTE: An increase or decrease  of 30% or more on serial  testing suggests a  clinically important change)    15:16   CK, Total  251  CPK-MB, Serum  4.2 (Result(s) reported on 08 Dec 2012 at 04:00PM.)  Troponin I  0.10 (0.00-0.05 0.05 ng/mL or less: NEGATIVE  Repeat testing in 3-6 hrs  if clinically indicated. >0.05 ng/mL: POTENTIAL  MYOCARDIAL INJURY. Repeat  testing in 3-6 hrs if  clinically indicated. NOTE: An increase or decrease  of 30% or more on serial  testing suggests a  clinically important change)  Routine Hem:  11-Jun-14 06:42   WBC (CBC) 10.1  RBC (CBC) 4.84  Hemoglobin (CBC) 13.7  Hematocrit (CBC) 41.7  Platelet Count (CBC) 197 (Result(s) reported on 08 Dec 2012 at 06:58AM.)  MCV 86  MCH 28.3  MCHC 32.8  RDW  16.6   EKG:  Interpretation EKG shows NSR with T wave ABN in V5, V6, I and AVL (seen before)   Radiology Results: XRay:    11-Jun-14 08:00, Chest PA and Lateral  Chest PA and Lateral   REASON FOR EXAM:    sob  COMMENTS:       PROCEDURE: DXR - DXR CHEST PA (OR AP) AND LATERAL  - Dec 08 2012  8:00AM     RESULT: Comparison: None    Findings:     PA and lateral chest radiographs are provided. There is bilateral diffuse   interstitial thickening likely representing interstitial edema versus   interstitial pneumonitis secondary to an infectious or inflammatory   etiology. There is no focal parenchymal opacity, pleural effusion, or   pneumothorax. The heart and mediastinum are unremarkable.  The osseous   structures are unremarkable.  IMPRESSION:     There is bilateral diffuse interstitial thickening likely representing   interstitial edema versus interstitial pneumonitis secondary to an   infectious or inflammatory etiology.    Dictation Site: 1        Verified By: Jennette Banker, M.D., MD    No Known Allergies:   Vital Signs/Nurse's Notes: **Vital Signs.:   11-Jun-14 11:13  Vital Signs Type Admission  Temperature  Temperature (F) 98.2  Celsius 36.7  Temperature Source oral  Pulse Pulse 69  Respirations Respirations 21  Systolic BP Systolic BP 786  Diastolic BP (mmHg) Diastolic BP (mmHg) 767  Mean BP 132  Pulse Ox % Pulse Ox % 97  Pulse Ox Activity Level  At rest  Oxygen Delivery Room Air/ 21 %    Impression 59 y/o [place] American male with hx of COPD, continued smoking 1 ppd, CAD and severe hypertension. Presented with  of SOB, wheezing and recurrent chest pain.    1- malignant HTN Started on lisinopril, clonidine, metoprolol Would monitor for now. Unclear why he has been out of his medications for two months Suspect noncompliance issues  2-  chest pain Atypical in nature, he has chronic pain, asking for pain meds.  Cardiac  enz negative so far EKG unchanged Would monitor cardiac enz. Outpt stress as sx are very vague, he is concerned more about blood pressure, breathing, chronic pain   3-  CAD s/p stents asa, Cautious with b-blockers given copd exacerbation (bronchospasm) Ace, statin Smoking cessation  5- copd/ Tobacco abuse Will defer to PMD. Does not appear to have active COPD flare  6- Non compliance   Electronic Signatures: Ida Rogue (MD)  (Signed 11-Jun-14 18:33)  Authored: General Aspect/Present Illness, History and Physical Exam, Review of System, Home Medications, Labs, EKG , Radiology, Allergies, Vital Signs/Nurse's Notes, Impression/Plan   Last Updated: 11-Jun-14 18:33 by Ida Rogue (MD)

## 2016-07-23 ENCOUNTER — Encounter: Payer: Self-pay | Admitting: Emergency Medicine

## 2016-07-23 ENCOUNTER — Emergency Department: Payer: Medicare Other

## 2016-07-23 ENCOUNTER — Inpatient Hospital Stay
Admission: EM | Admit: 2016-07-23 | Discharge: 2016-07-31 | DRG: 287 | Disposition: A | Discharge status: Home / Self Care | Payer: Medicare Other | Attending: Internal Medicine | Admitting: Internal Medicine

## 2016-07-23 DIAGNOSIS — Z7984 Long term (current) use of oral hypoglycemic drugs: Secondary | ICD-10-CM

## 2016-07-23 DIAGNOSIS — Z955 Presence of coronary angioplasty implant and graft: Secondary | ICD-10-CM

## 2016-07-23 DIAGNOSIS — E119 Type 2 diabetes mellitus without complications: Secondary | ICD-10-CM | POA: Diagnosis present

## 2016-07-23 DIAGNOSIS — Z6837 Body mass index (BMI) 37.0-37.9, adult: Secondary | ICD-10-CM

## 2016-07-23 DIAGNOSIS — I2510 Atherosclerotic heart disease of native coronary artery without angina pectoris: Secondary | ICD-10-CM | POA: Diagnosis present

## 2016-07-23 DIAGNOSIS — I214 Non-ST elevation (NSTEMI) myocardial infarction: Secondary | ICD-10-CM

## 2016-07-23 DIAGNOSIS — Z87891 Personal history of nicotine dependence: Secondary | ICD-10-CM

## 2016-07-23 DIAGNOSIS — R0603 Acute respiratory distress: Secondary | ICD-10-CM | POA: Diagnosis present

## 2016-07-23 DIAGNOSIS — E785 Hyperlipidemia, unspecified: Secondary | ICD-10-CM | POA: Diagnosis present

## 2016-07-23 DIAGNOSIS — R079 Chest pain, unspecified: Secondary | ICD-10-CM | POA: Diagnosis not present

## 2016-07-23 DIAGNOSIS — Z720 Tobacco use: Secondary | ICD-10-CM | POA: Diagnosis present

## 2016-07-23 DIAGNOSIS — Z9114 Patient's other noncompliance with medication regimen: Secondary | ICD-10-CM

## 2016-07-23 DIAGNOSIS — E669 Obesity, unspecified: Secondary | ICD-10-CM | POA: Diagnosis present

## 2016-07-23 DIAGNOSIS — I272 Pulmonary hypertension, unspecified: Secondary | ICD-10-CM | POA: Diagnosis present

## 2016-07-23 DIAGNOSIS — I493 Ventricular premature depolarization: Secondary | ICD-10-CM | POA: Diagnosis not present

## 2016-07-23 DIAGNOSIS — I4729 Other ventricular tachycardia: Secondary | ICD-10-CM

## 2016-07-23 DIAGNOSIS — I472 Ventricular tachycardia: Secondary | ICD-10-CM | POA: Diagnosis not present

## 2016-07-23 DIAGNOSIS — I110 Hypertensive heart disease with heart failure: Secondary | ICD-10-CM | POA: Diagnosis not present

## 2016-07-23 DIAGNOSIS — J449 Chronic obstructive pulmonary disease, unspecified: Secondary | ICD-10-CM | POA: Diagnosis present

## 2016-07-23 DIAGNOSIS — Z7982 Long term (current) use of aspirin: Secondary | ICD-10-CM

## 2016-07-23 DIAGNOSIS — I255 Ischemic cardiomyopathy: Secondary | ICD-10-CM | POA: Diagnosis present

## 2016-07-23 DIAGNOSIS — I5043 Acute on chronic combined systolic (congestive) and diastolic (congestive) heart failure: Secondary | ICD-10-CM | POA: Diagnosis present

## 2016-07-23 DIAGNOSIS — Z7902 Long term (current) use of antithrombotics/antiplatelets: Secondary | ICD-10-CM

## 2016-07-23 DIAGNOSIS — M109 Gout, unspecified: Secondary | ICD-10-CM | POA: Diagnosis present

## 2016-07-23 DIAGNOSIS — I509 Heart failure, unspecified: Secondary | ICD-10-CM

## 2016-07-23 DIAGNOSIS — I119 Hypertensive heart disease without heart failure: Secondary | ICD-10-CM | POA: Diagnosis present

## 2016-07-23 DIAGNOSIS — Z79899 Other long term (current) drug therapy: Secondary | ICD-10-CM

## 2016-07-23 DIAGNOSIS — R718 Other abnormality of red blood cells: Secondary | ICD-10-CM | POA: Diagnosis present

## 2016-07-23 DIAGNOSIS — I10 Essential (primary) hypertension: Secondary | ICD-10-CM | POA: Diagnosis present

## 2016-07-23 HISTORY — DX: Hypertensive heart disease without heart failure: I11.9

## 2016-07-23 HISTORY — DX: Atherosclerotic heart disease of native coronary artery without angina pectoris: I25.10

## 2016-07-23 LAB — BASIC METABOLIC PANEL
Anion gap: 7 (ref 5–15)
BUN: 13 mg/dL (ref 6–20)
CHLORIDE: 104 mmol/L (ref 101–111)
CO2: 28 mmol/L (ref 22–32)
CREATININE: 1.2 mg/dL (ref 0.61–1.24)
Calcium: 9.1 mg/dL (ref 8.9–10.3)
GFR calc Af Amer: >60 mL/min (ref >60)
GFR calc non Af Amer: >60 mL/min (ref >60)
GLUCOSE: 124 mg/dL — AB (ref 65–99)
POTASSIUM: 4 mmol/L (ref 3.5–5.1)
Sodium: 139 mmol/L (ref 135–145)

## 2016-07-23 LAB — CBC
HEMATOCRIT: 37.1 % — AB (ref 40.0–52.0)
Hemoglobin: 11.4 g/dL — ABNORMAL LOW (ref 13.0–18.0)
MCH: 24.3 pg — AB (ref 26.0–34.0)
MCHC: 30.8 g/dL — ABNORMAL LOW (ref 32.0–36.0)
MCV: 79 fL — AB (ref 80.0–100.0)
PLATELETS: 152 10*3/uL (ref 150–440)
RBC: 4.7 MIL/uL (ref 4.40–5.90)
RDW: 19 % — AB (ref 11.5–14.5)
WBC: 5.6 10*3/uL (ref 3.8–10.6)

## 2016-07-23 LAB — TROPONIN I: Troponin I: 0.05 ng/mL (ref <0.03)

## 2016-07-23 LAB — BRAIN NATRIURETIC PEPTIDE: B Natriuretic Peptide: 1533 pg/mL — ABNORMAL HIGH (ref 0.0–100.0)

## 2016-07-23 MED ORDER — ASPIRIN 81 MG PO CHEW
324.0000 mg | CHEWABLE_TABLET | Freq: Once | ORAL | Status: AC
  Administered 2016-07-23: 324 mg via ORAL
  Filled 2016-07-23: qty 4

## 2016-07-23 MED ORDER — FUROSEMIDE 10 MG/ML IJ SOLN
60.0000 mg | Freq: Once | INTRAMUSCULAR | Status: AC
  Administered 2016-07-23: 60 mg via INTRAVENOUS
  Filled 2016-07-23: qty 8

## 2016-07-23 NOTE — ED Triage Notes (Signed)
Patient brought in by ems from home. Patient with complaint of chest pain and shortness of breath times 1 month. Patient with complaint of cough times 2 weeks.

## 2016-07-23 NOTE — ED Provider Notes (Addendum)
[facility] Emergency Department Provider Note  ____________________________________________  Time seen: Approximately 10:22 PM  I have reviewed the triage vital signs and the nursing notes.   HISTORY  Chief Complaint Chest Pain    HPI Chrsitopher Wik is a 61 y.o. male with a history of HTN, HL, CAD, COPD presenting with 1 month of orthopnea, shortness of breath, and chest pain. The patient has also had significant lower extremity swelling and a 15 pound weight gain.No cough or cold symptoms, fever or chills.   Past Medical History:  Diagnosis Date  . Acute bronchitis   . Acute respiratory failure (HCC)   . COPD (chronic obstructive pulmonary disease) (HCC)   . Coronary artery disease   . Gout   . Hyperlipidemia   . Hypertension   . Obesity   . Tobacco abuse     There are no active problems to display for this patient.   Past Surgical History:  Procedure Laterality Date  . CARDIAC CATHETERIZATION  2009   [facility];   . CARDIAC CATHETERIZATION  2010  . CORONARY ANGIOPLASTY  2009   s/p stent placement at [facility].    Current Outpatient Rx  . Order #: [id number] Class: Historical Med  . Order #: [id number] Class: Historical Med  . Order #: [id number] Class: Historical Med  . Order #: [id number] Class: Historical Med  . Order #: [id number] Class: Historical Med  . Order #: [id number] Class: Historical Med  . Order #: [id number] Class: Historical Med  . Order #: [id number] Class: Historical Med  . Order #: [id number] Class: Historical Med  . Order #: [id number] Class: Historical Med  . Order #: [id number] Class: Historical Med  . Order #: [id number] Class: Historical Med  . Order #: [id number] Class: Historical Med  . Order #: [id number] Class: Historical Med  . Order #: [id number] Class: Historical Med  . Order #: [id number] Class: Historical Med  . Order #: [id number] Class: Historical Med    Allergies Patient has no known allergies.  Family History  Problem Relation Age of Onset  . Heart  attack Mother 26    Social History Social History  Substance Use Topics  . Smoking status: Former Smoker    Packs/day: 1.00    Types: Cigarettes  . Smokeless tobacco: Never Used  . Alcohol use Not on file    Review of Systems Constitutional: No fever/chills.No lightheadedness or syncope. Positive weight gain of 15 pounds over the last month. Eyes: No visual changes. ENT: No sore throat. No congestion or rhinorrhea. Cardiovascular: Positive chest pain. Denies palpitations. Respiratory: Positive orthopnea and exertional shortness of breath.  No cough. Gastrointestinal: No abdominal pain.  No nausea, no vomiting.  No diarrhea.  No constipation. Genitourinary: Negative for dysuria. Musculoskeletal: Negative for back pain. Positive bilateral large semi-swelling. Skin: Negative for rash. Neurological: Negative for headaches. No focal numbness, tingling or weakness.   10-point ROS otherwise negative.  ____________________________________________   PHYSICAL EXAM:  VITAL SIGNS: ED Triage Vitals  Enc Vitals Group     BP 07/23/16 2106 (!) 142/75     Pulse Rate 07/23/16 2106 82     Resp 07/23/16 2106 20     Temp 07/23/16 2106 98 F (36.7 C)     Temp Source 07/23/16 2106 Oral     SpO2 07/23/16 2106 97 %     Weight 07/23/16 2100 245 lb (111.1 kg)     Height 07/23/16 2100 5\' 11"  (1.803 m)     Head Circumference --      Peak Flow --      Pain Score  07/23/16 2101 10     Pain Loc --      Pain Edu? --      Excl. in GC? --     Constitutional: Alert and oriented. Well appearing and in no acute distress. Answers questions appropriately. Eyes: Conjunctivae are normal.  EOMI. No scleral icterus. Head: Atraumatic. Nose: No congestion/rhinnorhea. Mouth/Throat: Mucous membranes are moist.  Neck: No stridor.  Supple.  Positive JVD. Cardiovascular: Normal rate, regular rhythm. No murmurs, rubs or gallops.  Respiratory: Normal respiratory effort.  No accessory muscle use or retractions.  Lungs CTAB.  No wheezes, rales or ronchi. Decreased breath sounds at the bases. Gastrointestinal: Obese. Soft, nontender and nondistended.  No guarding or rebound.  No peritoneal signs. Musculoskeletal: Positive pitting lower extremity edema to the mid thigh. No ttp in the calves or palpable cords.  Negative Homan's sign. Neurologic:  A&Ox3.  Speech is clear.  Face and smile are symmetric.  EOMI.  Moves all extremities well. Skin:  Skin is warm, dry and intact. No rash noted. Psychiatric: Mood and affect are normal. Speech and behavior are normal.  Normal judgement.  ____________________________________________   LABS (all labs ordered are listed, but only abnormal results are displayed)  Labs Reviewed  BASIC METABOLIC PANEL - Abnormal; Notable for the following:       Result Value   Glucose, Bld 124 (*)    All other components within normal limits  CBC - Abnormal; Notable for the following:    Hemoglobin 11.4 (*)    HCT 37.1 (*)    MCV 79.0 (*)    MCH 24.3 (*)    MCHC 30.8 (*)    RDW 19.0 (*)    All other components within normal limits  TROPONIN I - Abnormal; Notable for the following:    Troponin I 0.05 (*)    All other components within normal limits  BRAIN NATRIURETIC PEPTIDE   ____________________________________________  EKG  ED ECG REPORT I, Rockne Menghini, the attending physician, personally viewed and interpreted this ECG.   Date: 07/23/2016  EKG Time: 2112  Rate: 85  Rhythm: normal sinus rhythm  Axis: normal  Intervals: First degree block  ST&T Change: Positive PVC. No ST elevation.  ____________________________________________  RADIOLOGY  Dg Chest 2 View  Result Date: 07/23/2016 CLINICAL DATA:  Chest pain and shortness of breath for 1 month. History of bronchitis, COPD. EXAM: CHEST  2 VIEW COMPARISON:  Chest radiograph December 11, 2012 FINDINGS: Cardiac silhouette appears mildly enlarged. Mediastinal silhouette is nonsuspicious, calcified aortic  knob. Pulmonary vascular congestion mild interstitial prominence without pleural effusion or focal consolidation. Mild hyperinflation. RIGHT lung base scarring. No pneumothorax. Soft tissue planes included osseous structures are nonsuspicious. IMPRESSION: Cardiomegaly, interstitial prominence concerning for pulmonary edema. Electronically Signed   By: Awilda Metro M.D.   On: 07/23/2016 21:51    ____________________________________________   PROCEDURES  Procedure(s) performed: None  Procedures  Critical Care performed: No ____________________________________________   INITIAL IMPRESSION / ASSESSMENT AND PLAN / ED COURSE  Pertinent labs & imaging results that were available during my care of the patient were reviewed by me and considered in my medical decision making (see chart for details).  61 y.o. male with a significant cardiac history presenting with 1 month of progressively worsening left semi-swelling, 15 pound weight gain, exertional dyspnea, and orthopnea. Overall, the patient's clinical picture trismus consistent with CHF exacerbation with fluid overload. He does have pulmonary edema, and will require inpatient hospital physician for diuresis. He does have a  positive troponin will receive aspirin, but is not having chest pain at this time so heparinization is not indicated. His troponin will need to be trended. [facility], so a fax has been sent for admission. If they're unable to admit him, when he will be admitted to the hospital here.  ----------------------------------------- 11:57 PM on 07/23/2016 -----------------------------------------  [facility] and [facility]..  ____________________________________________  FINAL CLINICAL IMPRESSION(S) / ED DIAGNOSES  Final diagnoses:  Acute on chronic congestive heart failure, unspecified congestive heart failure type (HCC)  NSTEMI  (non-ST elevated myocardial infarction) (HCC)         NEW MEDICATIONS STARTED DURING THIS VISIT:  New Prescriptions   No medications on file      Rockne MenghiniAnne-Caroline Ornella Coderre, MD 07/23/16 [id number]    Rockne MenghiniAnne-Caroline Eliberto Sole, MD 07/23/16 2357

## 2016-07-23 NOTE — ED Notes (Signed)
Pt states central CP x 1 month, no radiation. States he has to sleep sitting up, states has gained 15 lb in last month, states SOB with exertion. Pt in no distress at this time, alert and oriented. States [facility] patient.

## 2016-07-23 NOTE — ED Triage Notes (Signed)
Pt to triage via w/c with no distress noted, brought in by EMS; st x month having lower CP and SHOB; unable to see PCP until March and [facility]

## 2016-07-24 ENCOUNTER — Encounter: Payer: Self-pay | Admitting: Internal Medicine

## 2016-07-24 ENCOUNTER — Inpatient Hospital Stay (HOSPITAL_COMMUNITY)
Admit: 2016-07-24 | Discharge: 2016-07-24 | Disposition: A | Discharge status: Home / Self Care | Payer: Medicare Other | Attending: Internal Medicine | Admitting: Internal Medicine

## 2016-07-24 DIAGNOSIS — Z955 Presence of coronary angioplasty implant and graft: Secondary | ICD-10-CM | POA: Diagnosis not present

## 2016-07-24 DIAGNOSIS — I110 Hypertensive heart disease with heart failure: Secondary | ICD-10-CM | POA: Diagnosis present

## 2016-07-24 DIAGNOSIS — I5021 Acute systolic (congestive) heart failure: Secondary | ICD-10-CM

## 2016-07-24 DIAGNOSIS — I272 Pulmonary hypertension, unspecified: Secondary | ICD-10-CM | POA: Diagnosis present

## 2016-07-24 DIAGNOSIS — E785 Hyperlipidemia, unspecified: Secondary | ICD-10-CM | POA: Diagnosis present

## 2016-07-24 DIAGNOSIS — I10 Essential (primary) hypertension: Secondary | ICD-10-CM

## 2016-07-24 DIAGNOSIS — I472 Ventricular tachycardia: Secondary | ICD-10-CM | POA: Diagnosis not present

## 2016-07-24 DIAGNOSIS — R718 Other abnormality of red blood cells: Secondary | ICD-10-CM | POA: Diagnosis present

## 2016-07-24 DIAGNOSIS — R079 Chest pain, unspecified: Secondary | ICD-10-CM | POA: Diagnosis present

## 2016-07-24 DIAGNOSIS — I5033 Acute on chronic diastolic (congestive) heart failure: Secondary | ICD-10-CM

## 2016-07-24 DIAGNOSIS — Z79899 Other long term (current) drug therapy: Secondary | ICD-10-CM | POA: Diagnosis not present

## 2016-07-24 DIAGNOSIS — J449 Chronic obstructive pulmonary disease, unspecified: Secondary | ICD-10-CM | POA: Diagnosis present

## 2016-07-24 DIAGNOSIS — I2510 Atherosclerotic heart disease of native coronary artery without angina pectoris: Secondary | ICD-10-CM | POA: Diagnosis present

## 2016-07-24 DIAGNOSIS — I5043 Acute on chronic combined systolic (congestive) and diastolic (congestive) heart failure: Secondary | ICD-10-CM | POA: Diagnosis present

## 2016-07-24 DIAGNOSIS — Z9114 Patient's other noncompliance with medication regimen: Secondary | ICD-10-CM | POA: Diagnosis not present

## 2016-07-24 DIAGNOSIS — Z7902 Long term (current) use of antithrombotics/antiplatelets: Secondary | ICD-10-CM | POA: Diagnosis not present

## 2016-07-24 DIAGNOSIS — Z720 Tobacco use: Secondary | ICD-10-CM | POA: Diagnosis present

## 2016-07-24 DIAGNOSIS — I255 Ischemic cardiomyopathy: Secondary | ICD-10-CM | POA: Diagnosis present

## 2016-07-24 DIAGNOSIS — Z87891 Personal history of nicotine dependence: Secondary | ICD-10-CM | POA: Diagnosis not present

## 2016-07-24 DIAGNOSIS — E669 Obesity, unspecified: Secondary | ICD-10-CM | POA: Diagnosis present

## 2016-07-24 DIAGNOSIS — Z7984 Long term (current) use of oral hypoglycemic drugs: Secondary | ICD-10-CM | POA: Diagnosis not present

## 2016-07-24 DIAGNOSIS — M109 Gout, unspecified: Secondary | ICD-10-CM | POA: Diagnosis present

## 2016-07-24 DIAGNOSIS — I493 Ventricular premature depolarization: Secondary | ICD-10-CM | POA: Diagnosis not present

## 2016-07-24 DIAGNOSIS — I119 Hypertensive heart disease without heart failure: Secondary | ICD-10-CM | POA: Diagnosis present

## 2016-07-24 DIAGNOSIS — E119 Type 2 diabetes mellitus without complications: Secondary | ICD-10-CM | POA: Diagnosis present

## 2016-07-24 DIAGNOSIS — Z7982 Long term (current) use of aspirin: Secondary | ICD-10-CM | POA: Diagnosis not present

## 2016-07-24 DIAGNOSIS — R0603 Acute respiratory distress: Secondary | ICD-10-CM | POA: Diagnosis present

## 2016-07-24 DIAGNOSIS — Z6837 Body mass index (BMI) 37.0-37.9, adult: Secondary | ICD-10-CM | POA: Diagnosis not present

## 2016-07-24 LAB — CBC
HEMATOCRIT: 35.7 % — AB (ref 40.0–52.0)
Hemoglobin: 11.4 g/dL — ABNORMAL LOW (ref 13.0–18.0)
MCH: 25 pg — ABNORMAL LOW (ref 26.0–34.0)
MCHC: 32 g/dL (ref 32.0–36.0)
MCV: 78 fL — AB (ref 80.0–100.0)
PLATELETS: 150 10*3/uL (ref 150–440)
RBC: 4.58 MIL/uL (ref 4.40–5.90)
RDW: 18.7 % — ABNORMAL HIGH (ref 11.5–14.5)
WBC: 5.8 10*3/uL (ref 3.8–10.6)

## 2016-07-24 LAB — BASIC METABOLIC PANEL
Anion gap: 7 (ref 5–15)
BUN: 13 mg/dL (ref 6–20)
CHLORIDE: 104 mmol/L (ref 101–111)
CO2: 29 mmol/L (ref 22–32)
Calcium: 8.9 mg/dL (ref 8.9–10.3)
Creatinine, Ser: 1.15 mg/dL (ref 0.61–1.24)
GFR calc non Af Amer: >60 mL/min (ref >60)
Glucose, Bld: 137 mg/dL — ABNORMAL HIGH (ref 65–99)
POTASSIUM: 3.8 mmol/L (ref 3.5–5.1)
SODIUM: 140 mmol/L (ref 135–145)

## 2016-07-24 LAB — TROPONIN I
Troponin I: 0.06 ng/mL (ref <0.03)
Troponin I: 0.07 ng/mL (ref <0.03)
Troponin I: 0.06 ng/mL (ref <0.03)

## 2016-07-24 LAB — LIPID PANEL
Cholesterol: 93 mg/dL (ref 0–200)
HDL: 32 mg/dL — AB (ref >40)
LDL Cholesterol: 48 mg/dL (ref 0–99)
TRIGLYCERIDES: 67 mg/dL (ref <150)
Total CHOL/HDL Ratio: 2.9 RATIO
VLDL: 13 mg/dL (ref 0–40)

## 2016-07-24 LAB — MAGNESIUM: Magnesium: 2 mg/dL (ref 1.7–2.4)

## 2016-07-24 LAB — GLUCOSE, CAPILLARY
GLUCOSE-CAPILLARY: 109 mg/dL — AB (ref 65–99)
GLUCOSE-CAPILLARY: 129 mg/dL — AB (ref 65–99)
GLUCOSE-CAPILLARY: 164 mg/dL — AB (ref 65–99)
GLUCOSE-CAPILLARY: 150 mg/dL — AB (ref 65–99)
GLUCOSE-CAPILLARY: 156 mg/dL — AB (ref 65–99)

## 2016-07-24 LAB — TSH: TSH: 2.184 u[IU]/mL (ref 0.350–4.500)

## 2016-07-24 MED ORDER — LISINOPRIL 20 MG PO TABS
40.0000 mg | ORAL_TABLET | Freq: Every day | ORAL | Status: DC
  Administered 2016-07-24 – 2016-07-31 (×8): 40 mg via ORAL
  Filled 2016-07-24 (×8): qty 2

## 2016-07-24 MED ORDER — CLONIDINE HCL 0.1 MG PO TABS
0.1000 mg | ORAL_TABLET | Freq: Two times a day (BID) | ORAL | Status: DC
  Administered 2016-07-24 – 2016-07-25 (×4): 0.1 mg via ORAL
  Filled 2016-07-24 (×4): qty 1

## 2016-07-24 MED ORDER — ATORVASTATIN CALCIUM 20 MG PO TABS
20.0000 mg | ORAL_TABLET | Freq: Every day | ORAL | Status: DC
  Administered 2016-07-24 – 2016-07-25 (×2): 20 mg via ORAL
  Filled 2016-07-24 (×3): qty 1

## 2016-07-24 MED ORDER — HYDRALAZINE HCL 20 MG/ML IJ SOLN
10.0000 mg | INTRAMUSCULAR | Status: DC | PRN
  Administered 2016-07-24: 10 mg via INTRAVENOUS
  Filled 2016-07-24: qty 1

## 2016-07-24 MED ORDER — ENOXAPARIN SODIUM 40 MG/0.4ML ~~LOC~~ SOLN
40.0000 mg | SUBCUTANEOUS | Status: DC
  Administered 2016-07-24 – 2016-07-30 (×7): 40 mg via SUBCUTANEOUS
  Filled 2016-07-24 (×7): qty 0.4

## 2016-07-24 MED ORDER — IPRATROPIUM-ALBUTEROL 0.5-2.5 (3) MG/3ML IN SOLN: 3.0000 mL | RESPIRATORY_TRACT | Status: DC | PRN

## 2016-07-24 MED ORDER — ACETAMINOPHEN 325 MG PO TABS: 650.0000 mg | ORAL_TABLET | Freq: Four times a day (QID) | ORAL | Status: DC | PRN

## 2016-07-24 MED ORDER — TRAZODONE HCL 50 MG PO TABS
50.0000 mg | ORAL_TABLET | Freq: Every day | ORAL | Status: DC
  Administered 2016-07-24 – 2016-07-30 (×8): 50 mg via ORAL
  Filled 2016-07-24 (×8): qty 1

## 2016-07-24 MED ORDER — ONDANSETRON HCL 4 MG PO TABS: 4.0000 mg | ORAL_TABLET | Freq: Four times a day (QID) | ORAL | Status: DC | PRN

## 2016-07-24 MED ORDER — CLOPIDOGREL BISULFATE 75 MG PO TABS
75.0000 mg | ORAL_TABLET | Freq: Every day | ORAL | Status: DC
  Administered 2016-07-24 – 2016-07-31 (×8): 75 mg via ORAL
  Filled 2016-07-24 (×8): qty 1

## 2016-07-24 MED ORDER — MOMETASONE FURO-FORMOTEROL FUM 200-5 MCG/ACT IN AERO
2.0000 | INHALATION_SPRAY | Freq: Two times a day (BID) | RESPIRATORY_TRACT | Status: DC
  Administered 2016-07-24 – 2016-07-31 (×14): 2 via RESPIRATORY_TRACT
  Filled 2016-07-24: qty 8.8

## 2016-07-24 MED ORDER — POTASSIUM CHLORIDE CRYS ER 20 MEQ PO TBCR
30.0000 meq | EXTENDED_RELEASE_TABLET | Freq: Two times a day (BID) | ORAL | Status: DC
  Administered 2016-07-24 – 2016-07-31 (×15): 30 meq via ORAL
  Filled 2016-07-24 (×16): qty 1

## 2016-07-24 MED ORDER — OXYCODONE-ACETAMINOPHEN 5-325 MG PO TABS
1.0000 | ORAL_TABLET | Freq: Four times a day (QID) | ORAL | Status: DC | PRN
Start: 2016-07-24 — End: 2016-07-31
  Administered 2016-07-24 – 2016-07-31 (×11): 1 via ORAL
  Filled 2016-07-24 (×11): qty 1

## 2016-07-24 MED ORDER — LABETALOL HCL 5 MG/ML IV SOLN
10.0000 mg | INTRAVENOUS | Status: DC | PRN
  Administered 2016-07-24 (×2): 10 mg via INTRAVENOUS
  Filled 2016-07-24 (×2): qty 4

## 2016-07-24 MED ORDER — ACETAMINOPHEN 650 MG RE SUPP: 650.0000 mg | Freq: Four times a day (QID) | RECTAL | Status: DC | PRN

## 2016-07-24 MED ORDER — ONDANSETRON HCL 4 MG/2ML IJ SOLN: 4.0000 mg | Freq: Four times a day (QID) | INTRAMUSCULAR | Status: DC | PRN

## 2016-07-24 MED ORDER — INSULIN ASPART 100 UNIT/ML ~~LOC~~ SOLN
0.0000 [IU] | Freq: Three times a day (TID) | SUBCUTANEOUS | Status: DC
Start: 2016-07-24 — End: 2016-07-31
  Administered 2016-07-24 (×2): 1 [IU] via SUBCUTANEOUS
  Administered 2016-07-24 – 2016-07-25 (×2): 2 [IU] via SUBCUTANEOUS
  Administered 2016-07-25: 1 [IU] via SUBCUTANEOUS
  Administered 2016-07-26: 2 [IU] via SUBCUTANEOUS
  Administered 2016-07-26: 1 [IU] via SUBCUTANEOUS
  Administered 2016-07-26 – 2016-07-27 (×3): 2 [IU] via SUBCUTANEOUS
  Administered 2016-07-28: 3 [IU] via SUBCUTANEOUS
  Administered 2016-07-28: 2 [IU] via SUBCUTANEOUS
  Administered 2016-07-29: 1 [IU] via SUBCUTANEOUS
  Administered 2016-07-29 (×2): 2 [IU] via SUBCUTANEOUS
  Administered 2016-07-30: 1 [IU] via SUBCUTANEOUS
  Administered 2016-07-30 (×2): 2 [IU] via SUBCUTANEOUS
  Administered 2016-07-31: 1 [IU] via SUBCUTANEOUS
  Filled 2016-07-24: qty 1
  Filled 2016-07-24 (×2): qty 2
  Filled 2016-07-24 (×4): qty 1
  Filled 2016-07-24 (×3): qty 2
  Filled 2016-07-24: qty 3
  Filled 2016-07-24 (×2): qty 1
  Filled 2016-07-24 (×5): qty 2

## 2016-07-24 MED ORDER — SODIUM CHLORIDE 0.9% FLUSH
3.0000 mL | Freq: Two times a day (BID) | INTRAVENOUS | Status: DC
  Administered 2016-07-24 – 2016-07-31 (×15): 3 mL via INTRAVENOUS

## 2016-07-24 MED ORDER — FUROSEMIDE 10 MG/ML IJ SOLN
60.0000 mg | Freq: Once | INTRAMUSCULAR | Status: AC
  Administered 2016-07-24: 60 mg via INTRAVENOUS
  Filled 2016-07-24: qty 8

## 2016-07-24 MED ORDER — ASPIRIN 81 MG PO CHEW
81.0000 mg | CHEWABLE_TABLET | Freq: Every day | ORAL | Status: DC
  Administered 2016-07-24 – 2016-07-31 (×7): 81 mg via ORAL
  Filled 2016-07-24 (×7): qty 1

## 2016-07-24 MED ORDER — METOPROLOL SUCCINATE ER 50 MG PO TB24
50.0000 mg | ORAL_TABLET | Freq: Every day | ORAL | Status: DC
  Administered 2016-07-24: 50 mg via ORAL
  Filled 2016-07-24: qty 1

## 2016-07-24 MED ORDER — AMLODIPINE BESYLATE 10 MG PO TABS
10.0000 mg | ORAL_TABLET | Freq: Every day | ORAL | Status: DC
Start: 2016-07-24 — End: 2016-07-31
  Administered 2016-07-24 – 2016-07-31 (×8): 10 mg via ORAL
  Filled 2016-07-24 (×8): qty 1

## 2016-07-24 MED ORDER — GUAIFENESIN ER 600 MG PO TB12
600.0000 mg | ORAL_TABLET | Freq: Two times a day (BID) | ORAL | Status: DC
  Administered 2016-07-24 – 2016-07-31 (×16): 600 mg via ORAL
  Filled 2016-07-24 (×16): qty 1

## 2016-07-24 MED ORDER — DEXTROMETHORPHAN POLISTIREX ER 30 MG/5ML PO SUER
30.0000 mg | Freq: Two times a day (BID) | ORAL | Status: DC
  Administered 2016-07-24 – 2016-07-31 (×13): 30 mg via ORAL
  Filled 2016-07-24 (×15): qty 5

## 2016-07-24 MED ORDER — FUROSEMIDE 10 MG/ML IJ SOLN
60.0000 mg | Freq: Two times a day (BID) | INTRAMUSCULAR | Status: DC
  Administered 2016-07-24 – 2016-07-31 (×14): 60 mg via INTRAVENOUS
  Filled 2016-07-24 (×15): qty 6

## 2016-07-24 MED ORDER — INSULIN ASPART 100 UNIT/ML ~~LOC~~ SOLN
0.0000 [IU] | Freq: Every day | SUBCUTANEOUS | Status: DC
Start: 2016-07-24 — End: 2016-07-31

## 2016-07-24 MED ORDER — DM-GUAIFENESIN ER 30-600 MG PO TB12: 1.0000 | ORAL_TABLET | Freq: Two times a day (BID) | ORAL | Status: DC

## 2016-07-24 MED ORDER — IPRATROPIUM-ALBUTEROL 18-103 MCG/ACT IN AERO: 2.0000 | INHALATION_SPRAY | RESPIRATORY_TRACT | Status: DC | PRN

## 2016-07-24 NOTE — ED Notes (Addendum)
Pt's BP taken prior to transport, elevated again at 164/105, Dr. Anne HahnWillis informed, per Dr. Anne HahnWillis, send pt to floor with current BP.  No new orders given at this time

## 2016-07-24 NOTE — H&P (Signed)
[facility] [place] [facility]   PATIENT NAME: James CluckSamuel Harrington    MR#:  [id number]  DATE OF BIRTH:  July 02, 1955  DATE OF ADMISSION:  07/23/2016  PRIMARY CARE PHYSICIAN: [facility]   REQUESTING/REFERRING PHYSICIAN: Sharma CovertNorman, MD  CHIEF COMPLAINT:   Chief Complaint  Patient presents with  . Chest Pain    HISTORY OF PRESENT ILLNESS:  [name]  is a 61 y.o. male who presents with Aggressive shortness of breath, lower extremity swelling, increasing orthopnea for the past month. Patient has a known history of congestive heart failure. Here in the ED he was found to have an elevated BNP, and a chest x-ray consistent with pulmonary edema. Hospitalists were called for admission  PAST MEDICAL HISTORY:   Past Medical History:  Diagnosis Date  . Acute bronchitis   . Acute respiratory failure (HCC)   . COPD (chronic obstructive pulmonary disease) (HCC)   . Coronary artery disease   . Gout   . Hyperlipidemia   . Hypertension   . Obesity   . Tobacco abuse     PAST SURGICAL HISTORY:   Past Surgical History:  Procedure Laterality Date  . CARDIAC CATHETERIZATION  2009   [facility];   . CARDIAC CATHETERIZATION  2010  . CORONARY ANGIOPLASTY  2009   [facility]    SOCIAL HISTORY:   Social History  Substance Use Topics  . Smoking status: Former Smoker    Packs/day: 1.00    Types: Cigarettes  . Smokeless tobacco: Never Used  . Alcohol use No    FAMILY HISTORY:   Family History  Problem Relation Age of Onset  . Heart attack Mother 8855    DRUG ALLERGIES:  No Known Allergies  MEDICATIONS AT HOME:   Prior to Admission medications   Medication Sig Start Date End Date Taking? Authorizing Provider  albuterol-ipratropium (COMBIVENT) 18-103 MCG/ACT inhaler Inhale 2 puffs into the lungs every 4 (four) hours as needed for wheezing.   Yes Historical Provider, MD  allopurinol (ZYLOPRIM) 300 MG tablet Take 300 mg by mouth  daily.   Yes Historical Provider, MD  amLODipine (NORVASC) 10 MG tablet Take 10 mg by mouth daily.   Yes Historical Provider, MD  aspirin 81 MG tablet Take 81 mg by mouth daily.   Yes Historical Provider, MD  atorvastatin (LIPITOR) 40 MG tablet Take 20 mg by mouth daily.   Yes Historical Provider, MD  budesonide-formoterol (SYMBICORT) 160-4.5 MCG/ACT inhaler Inhale 2 puffs into the lungs 2 (two) times daily.   Yes Historical Provider, MD  docusate sodium (COLACE) 100 MG capsule Take 100 mg by mouth 2 (two) times daily.   Yes Historical Provider, MD  Fluticasone-Salmeterol (ADVAIR) 250-50 MCG/DOSE AEPB Inhale 1 puff into the lungs every 12 (twelve) hours.   Yes Historical Provider, MD  Indomethacin (INDOCIN PO) Take 60 mg by mouth daily.   Yes Historical Provider, MD  lisinopril (PRINIVIL,ZESTRIL) 40 MG tablet Take 40 mg by mouth daily.   Yes Historical Provider, MD  metFORMIN (GLUCOPHAGE) 500 MG tablet Take 500 mg by mouth daily with breakfast.   Yes Historical Provider, MD  METOPROLOL SUCCINATE ER PO Take 50 mg by mouth 2 (two) times daily. Take with or immediately following a meal.   Yes Historical Provider, MD  nitroGLYCERIN (NITROSTAT) 0.4 MG SL tablet Place 0.4 mg under the tongue every 5 (five) minutes as needed for chest pain.   Yes Historical Provider, MD  traZODone (DESYREL) 50  MG tablet Take 50 mg by mouth at bedtime.   Yes Historical Provider, MD  cloNIDine (CATAPRES) 0.1 MG tablet Take 0.1 mg by mouth 2 (two) times daily.    Historical Provider, MD  clopidogrel (PLAVIX) 75 MG tablet Take 75 mg by mouth daily.    Historical Provider, MD  hydrochlorothiazide (HYDRODIURIL) 12.5 MG tablet Take 12.5 mg by mouth daily.    Historical Provider, MD  nicotine (NICODERM CQ - DOSED IN MG/24 HR) 7 mg/24hr patch Place 1 patch onto the skin daily.    Historical Provider, MD  ondansetron (ZOFRAN) 4 MG tablet Take 4 mg by mouth every 4 (four) hours as needed for nausea.    Historical Provider, MD   oxyCODONE-acetaminophen (PERCOCET/ROXICET) 5-325 MG per tablet Take 1 tablet by mouth every 6 (six) hours as needed for pain.    Historical Provider, MD  PREDNISONE, PAK, PO Take 60 mg by mouth. Taper x 10 mg until stopped    Historical Provider, MD    REVIEW OF SYSTEMS:  Review of Systems  Constitutional: Negative for chills, fever, malaise/fatigue and weight loss.  HENT: Negative for ear pain, hearing loss and tinnitus.   Eyes: Negative for blurred vision, double vision, pain and redness.  Respiratory: Positive for shortness of breath. Negative for cough and hemoptysis.   Cardiovascular: Positive for orthopnea and leg swelling. Negative for chest pain and palpitations.  Gastrointestinal: Negative for abdominal pain, constipation, diarrhea, nausea and vomiting.  Genitourinary: Negative for dysuria, frequency and hematuria.  Musculoskeletal: Negative for back pain, joint pain and neck pain.  Skin:       No acne, rash, or lesions  Neurological: Negative for dizziness, tremors, focal weakness and weakness.  Endo/Heme/Allergies: Negative for polydipsia. Does not bruise/bleed easily.  Psychiatric/Behavioral: Negative for depression. The patient is not nervous/anxious and does not have insomnia.      VITAL SIGNS:   Vitals:   07/23/16 2230 07/23/16 2337 07/24/16 0000 07/24/16 0030  BP: (!) 158/122 (!) 177/118 (!) 156/116 (!) 168/112  Pulse: 84 91 86 85  Resp: 17 (!) 23 (!) 21 (!) 21  Temp:      TempSrc:      SpO2: 99% 99% 98% 96%  Weight:      Height:       Wt Readings from Last 3 Encounters:  07/23/16 111.1 kg (245 lb)    PHYSICAL EXAMINATION:  Physical Exam  Vitals reviewed. Constitutional: He is oriented to person, place, and time. He appears well-developed and well-nourished. No distress.  HENT:  Head: Normocephalic and atraumatic.  Mouth/Throat: Oropharynx is clear and moist.  Eyes: Conjunctivae and EOM are normal. Pupils are equal, round, and reactive to light. No  scleral icterus.  Neck: Normal range of motion. Neck supple. No JVD present. No thyromegaly present.  Cardiovascular: Normal rate, regular rhythm and intact distal pulses.  Exam reveals no gallop and no friction rub.   No murmur heard. Respiratory: Effort normal. No respiratory distress. He has no wheezes. He has rales.  GI: Soft. Bowel sounds are normal. He exhibits no distension. There is no tenderness.  Musculoskeletal: Normal range of motion. He exhibits edema.  No arthritis, no gout  Lymphadenopathy:    He has no cervical adenopathy.  Neurological: He is alert and oriented to person, place, and time. No cranial nerve deficit.  No dysarthria, no aphasia  Skin: Skin is warm and dry. No rash noted. No erythema.  Psychiatric: He has a normal mood and affect. His behavior is  normal. Judgment and thought content normal.    LABORATORY PANEL:   CBC  Recent Labs Lab 07/23/16 2109  WBC 5.6  HGB 11.4*  HCT 37.1*  PLT 152   ------------------------------------------------------------------------------------------------------------------  Chemistries   Recent Labs Lab 07/23/16 2109  NA 139  K 4.0  CL 104  CO2 28  GLUCOSE 124*  BUN 13  CREATININE 1.20  CALCIUM 9.1   ------------------------------------------------------------------------------------------------------------------  Cardiac Enzymes  Recent Labs Lab 07/23/16 2109  TROPONINI 0.05*   ------------------------------------------------------------------------------------------------------------------  RADIOLOGY:  Dg Chest 2 View  Result Date: 07/23/2016 CLINICAL DATA:  Chest pain and shortness of breath for 1 month. History of bronchitis, COPD. EXAM: CHEST  2 VIEW COMPARISON:  Chest radiograph December 11, 2012 FINDINGS: Cardiac silhouette appears mildly enlarged. Mediastinal silhouette is nonsuspicious, calcified aortic knob. Pulmonary vascular congestion mild interstitial prominence without pleural effusion or  focal consolidation. Mild hyperinflation. RIGHT lung base scarring. No pneumothorax. Soft tissue planes included osseous structures are nonsuspicious. IMPRESSION: Cardiomegaly, interstitial prominence concerning for pulmonary edema. Electronically Signed   By: Awilda Metro M.D.   On: 07/23/2016 21:51    EKG:   Orders placed or performed during the hospital encounter of 07/23/16  . ED EKG within 10 minutes  . ED EKG within 10 minutes    IMPRESSION AND PLAN:  Principal Problem:   Acute on chronic systolic CHF (congestive heart failure) (HCC) - IV Lasix given in the ED, we will repeat this dose tonight, get an echocardiogram and a cardiology consult in the morning. His cardiac enzymes were mildly elevated, we will trend them, though I suspect this is more likely due to troponin leak Active Problems:   COPD (chronic obstructive pulmonary disease) (HCC) - continue home inhalers   CAD (coronary artery disease) - continue home meds, trend troponins as above   HTN (hypertension) - continue home meds, additional when necessary antihypertensives for blood pressure goal less than 160/100   HLD (hyperlipidemia) - home dose anti-lipids  All the records are reviewed and case discussed with ED provider. Management plans discussed with the patient and/or family.  DVT PROPHYLAXIS: SubQ lovenox  GI PROPHYLAXIS: None  ADMISSION STATUS: Inpatient  CODE STATUS: Full Code Status History    This patient does not have a recorded code status. Please follow your organizational policy for patients in this situation.    Advance Directive Documentation   Flowsheet Row Most Recent Value  Type of Advance Directive  Healthcare Power of Attorney, Living will  Pre-existing out of facility DNR order (yellow form or pink MOST form)  No data  "MOST" Form in Place?  No data      TOTAL TIME TAKING CARE OF THIS PATIENT: 45 minutes.    Yosiah Jasmin FIELDING 07/24/2016, 12:55 AM  [facility]   Office  [phone]  CC: Primary care physician; [facility]

## 2016-07-24 NOTE — Progress Notes (Signed)
*  PRELIMINARY RESULTS* Echocardiogram 2D Echocardiogram has been performed.  Cristela BlueHege, Jericka Kadar 07/24/2016, 6:10 PM

## 2016-07-24 NOTE — Consult Note (Signed)
Cardiology Consultation Note  Patient ID: [name], MRN: [id number], DOB/AGE: 1955/07/27 61 y.o. Admit date: 07/23/2016   Date of Consult: 07/24/2016 Primary Physician: [facility] Primary Cardiologist: [facility] Requesting Physician: Dr. Anne Hahn, MD  Chief Complaint: SOB/weight gain Reason for Consult: Acute on chronic diastolic CHF  HPI: 61 y.o. male with h/o CAD s/p PCI/BMS to proximal and distal RCA as well as PCI/BMS to PDA on 01/28/2008 in the setting of unstable angina and abnormal stress echo with an EF > 55% at that time. Also with history of chronic diastolic CHF 2/2 poorly controlled blood pressure, COPD 2/2 tobacco abuse, hypertensive heart disease, DM, HLD, and obesity who presented to [facility] on 1/24 with a > 1 month history of worsening SOB and weight gain of > 20 pounds in 1 month.  He is followed by the [facility], last reported being seen there 4 months ago for routine follow up of his CAD. He does report having had a LHC in early 2017 for unstable angina with 2 stents being placed at that time. This was done at the [place] in [place], [place] and records are not on file in Epic or Care Everywhere. Plavix is listed no his medication list, though he reports not having taken Plavix in 3 years. He denies taking Brilinta or Effient. He does not know his baseline EF. Last documented of of > 55% in 2009 prior to his initial PCI.   Over the past 4-5 weeks the patient has noticed increased SOB, weight gain of > 20 pounds (patient reported dry weight of 240 pounds), increased LE swelling, abdominal swelling, and early satiety. At home, he is not on a diuretic. He does not eat a heart healthy diet and drinks large amounts of fluids. BP at home typically runs in the 180's systolic. Some chest tightness. No palpitations, nausea, vomiting, dizziness, presyncope, or syncope.    Upon the patient's arrival to [facility] they were found to be hypertensive with SBP > 170 mmHg, weight of 266  pounds. BNP 1533, WBC 5.6, HGB 11.4, PLT 152, troponin 0.05-->0.06, SCr 1.20, K+ 4.0. CXR showed cardiomegaly with pulmonary edema ECG as below. He was started on IV Lasix with a UOP since arriving of 3.4 L to date. He continues to feel volume overloaded. No chest pain. Echo is pending.   Past Medical History:  Diagnosis Date  . CAD in native artery    a. stress echo 12/2007 abnl, EF > 55%, b. LHC 01/28/08: mLAD 30, D1 40, dLCx 70, pRCA 30, mRCA 70, mRCA lesion 2 80, PDA 90, s/p PCI/BMS to prox and distal RCA, s/p PCI/BMS to PDA; c. patient reports PCI/stenting x 2 in early 2017 at the [facility] (no records on file)  . Chronic diastolic CHF (congestive heart failure) (HCC)    a. echo 2009: > 55%  . COPD (chronic obstructive pulmonary disease) (HCC)   . Gout   . Hyperlipidemia   . Hypertensive heart disease   . Obesity   . Tobacco abuse       Most Recent Cardiac Studies: LHC 01/28/2008:  mid LAD 30%, D1 40%, distal LCx 70%, proximal RCA 30%, mid RCA 70%, mid RCA lesion 2 80%, PDA 90%. he underwent successful PCI/BMS to the proximal and distal RCA as well as PCI/BMS to the PDA.    Surgical History:  Past Surgical History:  Procedure Laterality Date  . CARDIAC CATHETERIZATION  2009   [facility];   . CARDIAC CATHETERIZATION  2010  . CORONARY ANGIOPLASTY  2009   s/p stent placement at [facility].     Home Meds: Prior to Admission medications   Medication Sig Start Date End Date Taking? Authorizing Provider  albuterol-ipratropium (COMBIVENT) 18-103 MCG/ACT inhaler Inhale 2 puffs into the lungs every 4 (four) hours as needed for wheezing.   Yes Historical Provider, MD  allopurinol (ZYLOPRIM) 300 MG tablet Take 300 mg by mouth daily.   Yes Historical Provider, MD  amLODipine (NORVASC) 10 MG tablet Take 10 mg by mouth daily.   Yes Historical Provider, MD  aspirin 81 MG tablet Take 81 mg by mouth daily.   Yes Historical Provider, MD  atorvastatin (LIPITOR) 40 MG tablet Take 20 mg by mouth daily.   Yes  Historical Provider, MD  budesonide-formoterol (SYMBICORT) 160-4.5 MCG/ACT inhaler Inhale 2 puffs into the lungs 2 (two) times daily.   Yes Historical Provider, MD  docusate sodium (COLACE) 100 MG capsule Take 100 mg by mouth 2 (two) times daily.   Yes Historical Provider, MD  Fluticasone-Salmeterol (ADVAIR) 250-50 MCG/DOSE AEPB Inhale 1 puff into the lungs every 12 (twelve) hours.   Yes Historical Provider, MD  Indomethacin (INDOCIN PO) Take 60 mg by mouth daily.   Yes Historical Provider, MD  lisinopril (PRINIVIL,ZESTRIL) 40 MG tablet Take 40 mg by mouth daily.   Yes Historical Provider, MD  metFORMIN (GLUCOPHAGE) 500 MG tablet Take 500 mg by mouth daily with breakfast.   Yes Historical Provider, MD  METOPROLOL SUCCINATE ER PO Take 50 mg by mouth 2 (two) times daily. Take with or immediately following a meal.   Yes Historical Provider, MD  nitroGLYCERIN (NITROSTAT) 0.4 MG SL tablet Place 0.4 mg under the tongue every 5 (five) minutes as needed for chest pain.   Yes Historical Provider, MD  traZODone (DESYREL) 50 MG tablet Take 50 mg by mouth at bedtime.   Yes Historical Provider, MD  cloNIDine (CATAPRES) 0.1 MG tablet Take 0.1 mg by mouth 2 (two) times daily.    Historical Provider, MD  clopidogrel (PLAVIX) 75 MG tablet Take 75 mg by mouth daily.    Historical Provider, MD  hydrochlorothiazide (HYDRODIURIL) 12.5 MG tablet Take 12.5 mg by mouth daily.    Historical Provider, MD  nicotine (NICODERM CQ - DOSED IN MG/24 HR) 7 mg/24hr patch Place 1 patch onto the skin daily.    Historical Provider, MD  ondansetron (ZOFRAN) 4 MG tablet Take 4 mg by mouth every 4 (four) hours as needed for nausea.    Historical Provider, MD  oxyCODONE-acetaminophen (PERCOCET/ROXICET) 5-325 MG per tablet Take 1 tablet by mouth every 6 (six) hours as needed for pain.    Historical Provider, MD  PREDNISONE, PAK, PO Take 60 mg by mouth. Taper x 10 mg until stopped    Historical Provider, MD    Inpatient Medications:  .  amLODipine  10 mg Oral Daily  . aspirin  81 mg Oral Daily  . atorvastatin  20 mg Oral Daily  . cloNIDine  0.1 mg Oral BID  . clopidogrel  75 mg Oral Daily  . guaiFENesin  600 mg Oral BID   And  . dextromethorphan  30 mg Oral BID  . enoxaparin (LOVENOX) injection  40 mg Subcutaneous Q24H  . furosemide  60 mg Intravenous BID  . insulin aspart  0-5 Units Subcutaneous QHS  . insulin aspart  0-9 Units Subcutaneous TID WC  . lisinopril  40 mg Oral Daily  . metoprolol succinate  50 mg Oral Daily  .  mometasone-formoterol  2 puff Inhalation BID  . sodium chloride flush  3 mL Intravenous Q12H  . traZODone  50 mg Oral QHS     Allergies: No Known Allergies  Social History   Social History  . Marital status: Single    Spouse name: N/A  . Number of children: N/A  . Years of education: N/A   Occupational History  . Not on file.   Social History Main Topics  . Smoking status: Former Smoker    Packs/day: 1.00    Types: Cigarettes  . Smokeless tobacco: Never Used  . Alcohol use No  . Drug use: No  . Sexual activity: Not on file   Other Topics Concern  . Not on file   Social History Narrative  . No narrative on file     Family History  Problem Relation Age of Onset  . Heart attack Mother 42     Review of Systems: Review of Systems  Constitutional: Positive for malaise/fatigue. Negative for chills, diaphoresis, fever and weight loss.  HENT: Negative for congestion.   Eyes: Negative for discharge and redness.  Respiratory: Positive for cough, shortness of breath and wheezing. Negative for hemoptysis and sputum production.   Cardiovascular: Positive for orthopnea and leg swelling. Negative for chest pain, palpitations, claudication and PND.  Gastrointestinal: Negative for abdominal pain, blood in stool, heartburn, melena, nausea and vomiting.  Genitourinary: Negative for hematuria.  Musculoskeletal: Negative for falls and myalgias.  Skin: Negative for rash.  Neurological:  Positive for weakness. Negative for dizziness, tingling, tremors, sensory change, speech change, focal weakness and loss of consciousness.  Endo/Heme/Allergies: Does not bruise/bleed easily.  Psychiatric/Behavioral: Negative for substance abuse. The patient is not nervous/anxious.   All other systems reviewed and are negative.   Labs:  Recent Labs  07/23/16 2109 07/24/16 0251  TROPONINI 0.05* 0.06*   Lab Results  Component Value Date   WBC 5.8 07/24/2016   HGB 11.4 (L) 07/24/2016   HCT 35.7 (L) 07/24/2016   MCV 78.0 (L) 07/24/2016   PLT 150 07/24/2016     Recent Labs Lab 07/23/16 2109  NA 139  K 4.0  CL 104  CO2 28  BUN 13  CREATININE 1.20  CALCIUM 9.1  GLUCOSE 124*   Lab Results  Component Value Date   CHOL 191 12/09/2012   HDL 54 12/09/2012   LDLCALC 121 (H) 12/09/2012   TRIG 78 12/09/2012   No results found for: DDIMER  Radiology/Studies:  Dg Chest 2 View  Result Date: 07/23/2016 IMPRESSION: Cardiomegaly, interstitial prominence concerning for pulmonary edema. Electronically Signed   By: Awilda Metro M.D.   On: 07/23/2016 21:51    EKG: Interpreted by me showed: NSR, 85 bpm, 1st degree AV block, rare PVC, poor R wave progression, lateral TWI, nonspecific st/t changes (old) Telemetry: Interpreted by me showed: sinus rhythm  Weights: [facility] Weights   07/23/16 2100 07/24/16 0233  Weight: 245 lb (111.1 kg) 266 lb 3.2 oz (120.7 kg)     Physical Exam: Blood pressure (!) 166/98, pulse 77, temperature 97.3 F (36.3 C), temperature source Oral, resp. rate 18, height 5\' 11"  (1.803 m), weight 266 lb 3.2 oz (120.7 kg), SpO2 100 %. Body mass index is 37.13 kg/m. General: Well developed, well nourished, in no acute distress. Head: Normocephalic, atraumatic, sclera non-icteric, no xanthomas, nares are without discharge.  Neck: Negative for carotid bruits. JVD elevated ~ 12 cm. Lungs: Clear bilaterally to auscultation without wheezes, rales, or rhonchi.  Breathing is unlabored.  Heart: RRR with S1 S2. No murmurs, rubs, or gallops appreciated. Abdomen: Soft, non-tender, distended with normoactive bowel sounds. No hepatomegaly. No rebound/guarding. No obvious abdominal masses. Msk:  Strength and tone appear normal for age. Extremities: No clubbing or cyanosis. 2+ pitting edema to the bilateral thighs. Scrotal edema. Distal pedal pulses are 2+ and equal bilaterally. Neuro: Alert and oriented X 3. No facial asymmetry. No focal deficit. Moves all extremities spontaneously. Psych:  Responds to questions appropriately with a normal affect.    Assessment and Plan:  Principal Problem:   Acute respiratory distress Active Problems:   Acute on chronic diastolic CHF (congestive heart failure) (HCC)   Hypertensive heart disease   Tobacco abuse   Accelerated hypertension   COPD (chronic obstructive pulmonary disease) (HCC)   CAD (coronary artery disease)   HLD (hyperlipidemia)    1. Acute respiratory distress with hypoxia: -Likely in the setting of acute on chronic diastolic CHF with possible systolic CHF (most recent known EF from 2009 of > 55%  -Weaned off oxygen  2. Acute on chronic diastolic CHF: -Has diuresed well as above -Not on a diuretic at home, will likely need diuretic for home use -Echo pending to evaluate EF -Continue IV Lasix with KCl repletion  -Continue Toprol and lisinopril  -CHF education -If EF is found to be reduced may need ischemic evaluation prior to discharge -Optimize HF medications is indicated per echo -Check magnesium and tsh  3. Elevated troponin/CAD as above: -Troponin elevation minimal and flat trending likely in the setting of supply demand ischemia 2/2 volume overload and accelerated hypertension with hypertensive heart disease -Echo pending as above -May need either inpatient or outpatient ischemic evaluation as above -Continue ASA and Plavix (patient reports he has not taken Plavix, Brilinta, or Effient  in 3+ years) though he does self report PCI/stenting in early 2017 (no records) -Toprol, Lipitor -Check lipid and A1C for further risk stratification   4. Hypertensive heart disease/accelerated hypertension: -Improving -Continue to titrate medications as needed  5. COPD: -Does not appear to be an active exacerbation -Per IM  6. DM: -Per IM  7. Obesity: -Would benefit from pulmonary rehab   Signed, Carola FrostRyan Dunn, PA-C [facility] Pager: [phone] 07/24/2016, 9:17 AM

## 2016-07-24 NOTE — ED Notes (Signed)
Floor states they cannot take report until BP improves, pt to be given labetalol by this nurse.

## 2016-07-25 DIAGNOSIS — I5043 Acute on chronic combined systolic (congestive) and diastolic (congestive) heart failure: Secondary | ICD-10-CM

## 2016-07-25 LAB — GLUCOSE, CAPILLARY
GLUCOSE-CAPILLARY: 116 mg/dL — AB (ref 65–99)
Glucose-Capillary: 164 mg/dL — ABNORMAL HIGH (ref 65–99)
Glucose-Capillary: 127 mg/dL — ABNORMAL HIGH (ref 65–99)
Glucose-Capillary: 164 mg/dL — ABNORMAL HIGH (ref 65–99)

## 2016-07-25 LAB — HEMOGLOBIN A1C
Hgb A1c MFr Bld: 8.5 % — ABNORMAL HIGH (ref 4.8–5.6)
Mean Plasma Glucose: 197 mg/dL

## 2016-07-25 LAB — ECHOCARDIOGRAM COMPLETE
Height: 71 in
Weight: 4259.2 oz

## 2016-07-25 LAB — BASIC METABOLIC PANEL
Anion gap: 7 (ref 5–15)
BUN: 14 mg/dL (ref 6–20)
CO2: 29 mmol/L (ref 22–32)
Calcium: 8.6 mg/dL — ABNORMAL LOW (ref 8.9–10.3)
Chloride: 102 mmol/L (ref 101–111)
Creatinine, Ser: 1.07 mg/dL (ref 0.61–1.24)
GFR calc Af Amer: >60 mL/min (ref >60)
GLUCOSE: 173 mg/dL — AB (ref 65–99)
Potassium: 3.4 mmol/L — ABNORMAL LOW (ref 3.5–5.1)
Sodium: 138 mmol/L (ref 135–145)

## 2016-07-25 MED ORDER — CARVEDILOL 12.5 MG PO TABS
12.5000 mg | ORAL_TABLET | Freq: Two times a day (BID) | ORAL | Status: DC
Start: 2016-07-25 — End: 2016-07-30
  Administered 2016-07-25 – 2016-07-29 (×10): 12.5 mg via ORAL
  Filled 2016-07-25 (×10): qty 1

## 2016-07-25 MED ORDER — MAGNESIUM OXIDE 400 (241.3 MG) MG PO TABS
400.0000 mg | ORAL_TABLET | Freq: Every day | ORAL | Status: DC
  Administered 2016-07-25 – 2016-07-31 (×7): 400 mg via ORAL
  Filled 2016-07-25 (×7): qty 1

## 2016-07-25 MED ORDER — SPIRONOLACTONE 25 MG PO TABS
25.0000 mg | ORAL_TABLET | Freq: Every day | ORAL | Status: DC
  Administered 2016-07-25 – 2016-07-31 (×7): 25 mg via ORAL
  Filled 2016-07-25 (×7): qty 1

## 2016-07-25 MED ORDER — OXYCODONE-ACETAMINOPHEN 5-325 MG PO TABS
1.0000 | ORAL_TABLET | Freq: Once | ORAL | Status: AC
  Administered 2016-07-25: 1 via ORAL
  Filled 2016-07-25: qty 1

## 2016-07-25 MED ORDER — NITROGLYCERIN 0.4 MG SL SUBL
0.4000 mg | SUBLINGUAL_TABLET | SUBLINGUAL | Status: DC | PRN
  Administered 2016-07-25 (×3): 0.4 mg via SUBLINGUAL
  Filled 2016-07-25 (×3): qty 1

## 2016-07-25 NOTE — Progress Notes (Signed)
[employer] - [place] [facility]   PATIENT NAME: [name]    MR#:  [id number]  DATE OF BIRTH:  1956-06-25  SUBJECTIVE:  CHIEF COMPLAINT:   Chief Complaint  Patient presents with  . Chest Pain   Still has lower extremity edema. -5.6 L this admission. Lost 13 pounds. Continues to have pleuritic chest pain.  REVIEW OF SYSTEMS:    Review of Systems  Constitutional: Positive for malaise/fatigue. Negative for chills and fever.  HENT: Negative for sore throat.   Eyes: Negative for blurred vision, double vision and pain.  Respiratory: Positive for cough and shortness of breath. Negative for hemoptysis and wheezing.   Cardiovascular: Positive for chest pain and leg swelling. Negative for palpitations and orthopnea.  Gastrointestinal: Negative for abdominal pain, constipation, diarrhea, heartburn, nausea and vomiting.  Genitourinary: Negative for dysuria and hematuria.  Musculoskeletal: Negative for back pain and joint pain.  Skin: Negative for rash.  Neurological: Positive for weakness. Negative for sensory change, speech change, focal weakness and headaches.  Endo/Heme/Allergies: Does not bruise/bleed easily.  Psychiatric/Behavioral: Negative for depression. The patient is nervous/anxious.     DRUG ALLERGIES:  No Known Allergies  VITALS:  Blood pressure 136/80, pulse 77, temperature 98.1 F (36.7 C), temperature source Oral, resp. rate 18, height 5\' 11"  (1.803 m), weight 114.8 kg (253 lb), SpO2 97 %.  PHYSICAL EXAMINATION:   Physical Exam  GENERAL:  61 y.o.-year-old patient lying in the bed with no acute distress.  EYES: Pupils equal, round, reactive to light and accommodation. No scleral icterus. Extraocular muscles intact.  HEENT: Head atraumatic, normocephalic. Oropharynx and nasopharynx clear.  NECK:  Supple, no jugular venous distention. No thyroid enlargement, no tenderness.  LUNGS: Normal breath sounds bilaterally, no wheezing, rales, rhonchi.  No use of accessory muscles of respiration.  CARDIOVASCULAR: S1, S2 normal. No murmurs, rubs, or gallops.  ABDOMEN: Soft, nontender, nondistended. Bowel sounds present. No organomegaly or mass.  EXTREMITIES: Bilateral lower extremity edema  NEUROLOGIC: Cranial nerves II through XII are intact. No focal Motor or sensory deficits b/l.   PSYCHIATRIC: The patient is alert and oriented x 3.  SKIN: No obvious rash, lesion, or ulcer.   LABORATORY PANEL:   CBC  Recent Labs Lab 07/24/16 0816  WBC 5.8  HGB 11.4*  HCT 35.7*  PLT 150   ------------------------------------------------------------------------------------------------------------------ Chemistries   Recent Labs Lab 07/24/16 0816 07/25/16 0939  NA 140 138  K 3.8 3.4*  CL 104 102  CO2 29 29  GLUCOSE 137* 173*  BUN 13 14  CREATININE 1.15 1.07  CALCIUM 8.9 8.6*  MG 2.0  --    ------------------------------------------------------------------------------------------------------------------  Cardiac Enzymes  Recent Labs Lab 07/24/16 1505  TROPONINI 0.06*   ------------------------------------------------------------------------------------------------------------------  RADIOLOGY:  Dg Chest 2 View  Result Date: 07/23/2016 CLINICAL DATA:  Chest pain and shortness of breath for 1 month. History of bronchitis, COPD. EXAM: CHEST  2 VIEW COMPARISON:  Chest radiograph December 11, 2012 FINDINGS: Cardiac silhouette appears mildly enlarged. Mediastinal silhouette is nonsuspicious, calcified aortic knob. Pulmonary vascular congestion mild interstitial prominence without pleural effusion or focal consolidation. Mild hyperinflation. RIGHT lung base scarring. No pneumothorax. Soft tissue planes included osseous structures are nonsuspicious. IMPRESSION: Cardiomegaly, interstitial prominence concerning for pulmonary edema. Electronically Signed   By: Awilda Metroourtnay  Bloomer M.D.   On: 07/23/2016 21:51   ASSESSMENT AND PLAN:   * Acute on  chronic systolic CHF with ejection fraction 25-30% - IV Lasix, Beta blockers - Input and Output - Counseled to  limit fluids and Salt - Monitor Bun/Cr and Potassium - Echo - ejection fraction 25-30% -Cardiology following  * Chest pain, pleuritic Pain medications as needed. Nitroglycerin when necessary Will need ischemic workup inpatient versus outpatient as per cardiology recommendations  * Accelerated hypertension. Improving with blood pressure medications being restarted.  * Diabetes mellitus Sliding scale insulin   All the records are reviewed and case discussed with Care Management/Social Workerr. Management plans discussed with the patient, family and they are in agreement.  CODE STATUS: FULL CODE  DVT Prophylaxis: SCDs  TOTAL TIME TAKING CARE OF THIS PATIENT: 35 minutes.   POSSIBLE D/C IN 1-2 DAYS, DEPENDING ON CLINICAL CONDITION.  Milagros Loll R M.D on 07/25/2016 at 12:30 PM  Between 7am to 6pm - Pager - [phone]  After 6pm go to [email or web address] - password [email or web address] [facility]  [facility] [place] [facility]  Office  [phone]  CC: Primary care physician; [facility]  Note: This dictation was prepared with Dragon dictation along with smaller phrase technology. Any transcriptional errors that result from this process are unintentional.

## 2016-07-25 NOTE — Progress Notes (Signed)
Progress Note  Patient Name: [name] Date of Encounter: 07/25/2016  Primary Cardiologist: [facility].  Subjective   Ongoing dyspnea and cough.  Pleuritic c/p with cough.  Still with significant edema.  Inpatient Medications    Scheduled Meds: . amLODipine  10 mg Oral Daily  . aspirin  81 mg Oral Daily  . atorvastatin  20 mg Oral Daily  . cloNIDine  0.1 mg Oral BID  . clopidogrel  75 mg Oral Daily  . guaiFENesin  600 mg Oral BID   And  . dextromethorphan  30 mg Oral BID  . enoxaparin (LOVENOX) injection  40 mg Subcutaneous Q24H  . furosemide  60 mg Intravenous BID  . insulin aspart  0-5 Units Subcutaneous QHS  . insulin aspart  0-9 Units Subcutaneous TID WC  . lisinopril  40 mg Oral Daily  . metoprolol succinate  50 mg Oral Daily  . mometasone-formoterol  2 puff Inhalation BID  . potassium chloride  30 mEq Oral BID  . sodium chloride flush  3 mL Intravenous Q12H  . traZODone  50 mg Oral QHS   PRN Meds: acetaminophen **OR** acetaminophen, hydrALAZINE, ipratropium-albuterol, labetalol, nitroGLYCERIN, ondansetron **OR** ondansetron (ZOFRAN) IV, oxyCODONE-acetaminophen   Vital Signs    Vitals:   07/24/16 1239 07/24/16 1955 07/25/16 0435 07/25/16 0807  BP: (!) 142/97 135/65 (!) 145/91 (!) 154/91  Pulse: 84 80 79 80  Resp: 20 18 16 20   Temp: 98.4 F (36.9 C) 97.8 F (36.6 C)  97.9 F (36.6 C)  TempSrc: Oral Oral  Oral  SpO2: 93% 100% 95% 96%  Weight:   253 lb (114.8 kg)   Height:        Intake/Output Summary (Last 24 hours) at 07/25/16 0851 Last data filed at 07/25/16 0500  Gross per 24 hour  Intake              480 ml  Output             2475 ml  Net            -1995 ml   Filed Weights   07/23/16 2100 07/24/16 0233 07/25/16 0435  Weight: 245 lb (111.1 kg) 266 lb 3.2 oz (120.7 kg) 253 lb (114.8 kg)    Physical Exam   GEN: Well nourished, well developed, in no acute distress.  HEENT: Grossly normal.  Neck: Supple, no carotid bruits, or masses.   JVP to jaw. Cardiac: Irreg - freq ectopy, no murmurs, rubs, or gallops. No clubbing, cyanosis, 3+ bilat LE edema to mid thighs.  Radials/DP/PT 2+ and equal bilaterally.  Respiratory:  Respirations regular and unlabored, diminished breath sounds bilat. GI: obese, protuberant, firm, nontender, BS + x 4. MS: no deformity or atrophy. Skin: warm and dry, no rash. Neuro:  AAOx3.  Strength and sensation are intact. Psych: Normal affect.  Labs    Chemistry Recent Labs Lab 07/23/16 2109 07/24/16 0816  NA 139 140  K 4.0 3.8  CL 104 104  CO2 28 29  GLUCOSE 124* 137*  BUN 13 13  CREATININE 1.20 1.15  CALCIUM 9.1 8.9  GFRNONAA >60 >60  GFRAA >60 >60  ANIONGAP 7 7     Hematology Recent Labs Lab 07/23/16 2109 07/24/16 0816  WBC 5.6 5.8  RBC 4.70 4.58  HGB 11.4* 11.4*  HCT 37.1* 35.7*  MCV 79.0* 78.0*  MCH 24.3* 25.0*  MCHC 30.8* 32.0  RDW 19.0* 18.7*  PLT 152 150    Cardiac Enzymes Recent  Labs Lab 07/23/16 2109 07/24/16 0251 07/24/16 0816 07/24/16 1505  TROPONINI 0.05* 0.06* 0.07* 0.06*     BNP Recent Labs Lab 07/23/16 2109  BNP 1,533.0*     Radiology    Dg Chest 2 View  Result Date: 07/23/2016 CLINICAL DATA:  Chest pain and shortness of breath for 1 month. History of bronchitis, COPD. EXAM: CHEST  2 VIEW COMPARISON:  Chest radiograph December 11, 2012 FINDINGS: Cardiac silhouette appears mildly enlarged. Mediastinal silhouette is nonsuspicious, calcified aortic knob. Pulmonary vascular congestion mild interstitial prominence without pleural effusion or focal consolidation. Mild hyperinflation. RIGHT lung base scarring. No pneumothorax. Soft tissue planes included osseous structures are nonsuspicious. IMPRESSION: Cardiomegaly, interstitial prominence concerning for pulmonary edema. Electronically Signed   By: Awilda Metro M.D.   On: 07/23/2016 21:51    Telemetry    RSR, pvc's, up to 3 beats nsvt - Personally Reviewed  Cardiac Studies   Echo  pending  Patient Profile     61 y.o. male with h/o CAD s/p PCI/BMS to proximal and distal RCA as well as PCI/BMS to PDA on 01/28/2008 in the setting of unstable angina and abnormal stress echo with an EF > 55% at that time. Also with history of chronic diastolic CHF 2/2 poorly controlled blood pressure, COPD 2/2 tobacco abuse, hypertensive heart disease, DM, HLD, and obesity who presented to [facility] on 1/24 with a > 1 month history of worsening SOB and weight gain of > 20 pounds in 1 month.  Assessment & Plan    1.  Acute on chronic presumably diastolic chf:  Pt with prior h/o nl LV fxn presented with massive volume overload, dyspnea, cough, and pleuritic c/p. Echo performed yesterday - read pending. Diuresing well.  Minus 2.6L overnight and 5.39L since admission.  Wt is listed as being down 13 lbs since admission.  He still has significant volume overload.  Cont aggressive IV diuresis.  Labs pending this am.  Provide that renal fxn stable, would plan to add spironolactone and begin weaning off of clonidine.  Can also switch from toprol to coreg for better bp control.  2.  Hypertensive heart dzs w/chf:  BP's trending mostly 140's to 150's.  As above, switch to coreg. Plan to add spiro and wean clonidine.  Cont IV diuresis.  Cont amlodipine.  3.  CAD/elevated troponin:  He reports c/p however, this is pleuritic and and worse with cough. Trop is mildly elevated w/ flat trend.  Suspect demand ischemia in setting of CHF.  Echo pending.  If EF down, will need ischemic eval/cath.  Cont asa, statin, plavix,  blocker, acei.  4.  DM II:  A1c 8.5.  Insulin per IM.    5.  HL:  LDL 48.  Cont statin.  Signed, Nicolasa Ducking, NP  07/25/2016, 8:51 AM

## 2016-07-25 NOTE — Progress Notes (Signed)
Pt had 14 beat run of VTAC, upon assessment pt. Was asleep, easily awakened by voice. VSS, pt. Denies pain or SOB no acute distress noted. Dr. Anne HahnWillis notified, no new orders at this time, continue to monitor pt.

## 2016-07-25 NOTE — Care Management (Signed)
Found that [facility] is [facility] by [facility]. Transfer forms have been faxed the there is a confirmation fax sheet in the shadow chart.  There are no acute beds available at the [facility]. Faxed progress notes from today.

## 2016-07-25 NOTE — Plan of Care (Signed)
Problem: Pain Managment: Goal: General experience of comfort will improve Outcome: Progressing Difficulty controlling chest pain.  Pain maybe related to severe coughing or cardiac in nature.  Nitglycerin SL was ineffective.  He refused a third dose.  Percocet ii was beneficial

## 2016-07-26 DIAGNOSIS — I255 Ischemic cardiomyopathy: Secondary | ICD-10-CM

## 2016-07-26 DIAGNOSIS — I110 Hypertensive heart disease with heart failure: Principal | ICD-10-CM

## 2016-07-26 DIAGNOSIS — E784 Other hyperlipidemia: Secondary | ICD-10-CM

## 2016-07-26 DIAGNOSIS — I472 Ventricular tachycardia: Secondary | ICD-10-CM

## 2016-07-26 DIAGNOSIS — I4729 Other ventricular tachycardia: Secondary | ICD-10-CM

## 2016-07-26 LAB — BASIC METABOLIC PANEL
Anion gap: 8 (ref 5–15)
BUN: 16 mg/dL (ref 6–20)
CHLORIDE: 102 mmol/L (ref 101–111)
CO2: 26 mmol/L (ref 22–32)
Calcium: 8.8 mg/dL — ABNORMAL LOW (ref 8.9–10.3)
Creatinine, Ser: 1.2 mg/dL (ref 0.61–1.24)
GFR calc non Af Amer: >60 mL/min (ref >60)
GLUCOSE: 145 mg/dL — AB (ref 65–99)
Potassium: 3.8 mmol/L (ref 3.5–5.1)
Sodium: 136 mmol/L (ref 135–145)

## 2016-07-26 LAB — GLUCOSE, CAPILLARY
GLUCOSE-CAPILLARY: 188 mg/dL — AB (ref 65–99)
GLUCOSE-CAPILLARY: 146 mg/dL — AB (ref 65–99)
Glucose-Capillary: 129 mg/dL — ABNORMAL HIGH (ref 65–99)
Glucose-Capillary: 161 mg/dL — ABNORMAL HIGH (ref 65–99)

## 2016-07-26 LAB — FERRITIN: Ferritin: 65 ng/mL (ref 24–336)

## 2016-07-26 LAB — MAGNESIUM: Magnesium: 1.9 mg/dL (ref 1.7–2.4)

## 2016-07-26 MED ORDER — ATORVASTATIN CALCIUM 20 MG PO TABS
80.0000 mg | ORAL_TABLET | Freq: Every day | ORAL | Status: DC
  Administered 2016-07-27 – 2016-07-31 (×5): 80 mg via ORAL
  Filled 2016-07-26 (×5): qty 4

## 2016-07-26 MED ORDER — ISOSORB DINITRATE-HYDRALAZINE 20-37.5 MG PO TABS
1.0000 | ORAL_TABLET | Freq: Three times a day (TID) | ORAL | Status: DC
  Administered 2016-07-26 – 2016-07-31 (×14): 1 via ORAL
  Filled 2016-07-26 (×17): qty 1

## 2016-07-26 NOTE — Progress Notes (Signed)
Patient Name: James CluckSamuel Harrington      SUBJECTIVE: 61 year old gentleman admitted with progressive shortness of breath in the context of hypertension known coronary artery disease with prior PCI obesity diabetes and poor compliance. He has had significant interval worsening of LV systolic function 2009--55%>>>>> 25-30%. Catheterization is anticipated for Monday. No chest pain or shortness of breath   Voices understanding of the plan and situation   Past Medical History:  Diagnosis Date  . CAD in native artery    a. stress echo 12/2007 abnl, EF > 55%, b. LHC 01/28/08: mLAD 30, D1 40, dLCx 70, pRCA 30, mRCA 70, mRCA lesion 2 80, PDA 90, s/p PCI/BMS to prox and distal RCA, s/p PCI/BMS to PDA; c. patient [facility]no [facility] on file)  . Chronic diastolic CHF (congestive heart failure) (HCC)    a. echo 2009: > 55%  . COPD (chronic obstructive pulmonary disease) (HCC)   . Gout   . Hyperlipidemia   . Hypertensive heart disease   . Obesity   . Tobacco abuse     Scheduled Meds:  Scheduled Meds: . amLODipine  10 mg Oral Daily  . aspirin  81 mg Oral Daily  . atorvastatin  20 mg Oral Daily  . carvedilol  12.5 mg Oral BID WC  . clopidogrel  75 mg Oral Daily  . guaiFENesin  600 mg Oral BID   And  . dextromethorphan  30 mg Oral BID  . enoxaparin (LOVENOX) injection  40 mg Subcutaneous Q24H  . furosemide  60 mg Intravenous BID  . insulin aspart  0-5 Units Subcutaneous QHS  . insulin aspart  0-9 Units Subcutaneous TID WC  . lisinopril  40 mg Oral Daily  . magnesium oxide  400 mg Oral Daily  . mometasone-formoterol  2 puff Inhalation BID  . potassium chloride  30 mEq Oral BID  . sodium chloride flush  3 mL Intravenous Q12H  . spironolactone  25 mg Oral Daily  . traZODone  50 mg Oral QHS   Continuous Infusions: acetaminophen **OR** acetaminophen, hydrALAZINE, ipratropium-albuterol, labetalol, nitroGLYCERIN, ondansetron **OR** ondansetron  (ZOFRAN) IV, oxyCODONE-acetaminophen    PHYSICAL EXAM Vitals:   07/25/16 1958 07/25/16 2331 07/26/16 0347 07/26/16 0800  BP: 128/76 (!) 150/86 140/81 (!) 149/103  Pulse: 68 78 81 76  Resp: 16 20 18 16   Temp: 98 F (36.7 C)  98.2 F (36.8 C) 97.8 F (36.6 C)  TempSrc: Oral  Oral Oral  SpO2: 99% 99% 99% 98%  Weight:   253 lb (114.8 kg)   Height:      Well developed and nourished in no acute distress HENT normal Neck supple with JVP-flat Clear Regular rate and rhythm, no murmurs or gallops Abd-soft with active BS No Clubbing cyanosis edema Skin-warm and dry A & Oriented  Grossly normal sensory and motor function  TELEMETRY: Reviewed personnally pt in  Sinus with VTNS ECG personally reviewed sinus with lowvoltage and freq PVCs  Intake/Output Summary (Last 24 hours) at 07/26/16 1003 Last data filed at 07/26/16 0924  Gross per 24 hour  Intake              840 ml  Output             1400 ml  Net             -560 ml    LABS: Basic Metabolic Panel:  Recent Labs Lab 07/23/16 2109 07/24/16  1610 07/25/16 0939 07/26/16 0547  NA 139 140 138 136  K 4.0 3.8 3.4* 3.8  CL 104 104 102 102  CO2 28 29 29 26   GLUCOSE 124* 137* 173* 145*  BUN 13 13 14 16   CREATININE 1.20 1.15 1.07 1.20  CALCIUM 9.1 8.9 8.6* 8.8*  MG  --  2.0  --  1.9   Cardiac Enzymes:  Recent Labs  07/24/16 0251 07/24/16 0816 07/24/16 1505  TROPONINI 0.06* 0.07* 0.06*   CBC:  Recent Labs Lab 07/23/16 2109 07/24/16 0816  WBC 5.6 5.8  HGB 11.4* 11.4*  HCT 37.1* 35.7*  MCV 79.0* 78.0*  PLT 152 150   PROTIME: No results for input(s): LABPROT, INR in the last 72 hours. Liver Function Tests: No results for input(s): AST, ALT, ALKPHOS, BILITOT, PROT, ALBUMIN in the last 72 hours. No results for input(s): LIPASE, AMYLASE in the last 72 hours. BNP: BNP (last 3 results)  Recent Labs  07/23/16 2109  BNP 1,533.0*    ProBNP (last 3 results) No results for input(s): PROBNP in the last 8760  hours.  D-Dimer: No results for input(s): DDIMER in the last 72 hours. Hemoglobin A1C:  Recent Labs  07/24/16 0816  HGBA1C 8.5*   Fasting Lipid Panel:  Recent Labs  07/24/16 0816  CHOL 93  HDL 32*  LDLCALC 48  TRIG 67  CHOLHDL 2.9   Thyroid Function Tests:  Recent Labs  07/24/16 0816  TSH 2.184    ASSESSMENT AND PLAN:  Principal Problem:   Acute respiratory distress Active Problems:   COPD (chronic obstructive pulmonary disease) (HCC)   CAD (coronary artery disease)   HLD (hyperlipidemia)   Acute on chronic systolic CHF (congestive heart failure), NYHA class 3 (HCC)   Tobacco abuse   Accelerated hypertension   Hypertensive heart disease   Ischemic cardiomyopathy   Non-sustained ventricular tachycardia (HCC)  Catheterization is indicated to investigate the cause of his cardiomyopathy. The PVCs on his ECG are concerning given her density. We will need to exclude a PVC cardiomyopathy. Aggressive medical therapy is indicated in this young man. The event that his LV function does not improve ICD therapy for primary prevention is appropriate to consider  Will add Bidil given elevated blood pressure  Increase atorva to high intensity therapy  His microcytosis is new from 2009  Will order ferritin although interpretation    Signed, Sherryl Manges MD  07/26/2016'

## 2016-07-26 NOTE — Progress Notes (Signed)
Patient was noted to have 5 beats of VT. Patient is asymptomatic.  Dr. Anne HahnWillis notified without any new order. Will continue to monitor.

## 2016-07-26 NOTE — Progress Notes (Addendum)
[employer] - [place] at [facility]   PATIENT NAME: [name]    MR#:  [id number]  DATE OF BIRTH:  1956/04/22  SUBJECTIVE:  CHIEF COMPLAINT:   Chief Complaint  Patient presents with  . Chest Pain   -Feels about the same. Had few beats of nonsustained V. tach last night. -For cardiac catheterization Monday  REVIEW OF SYSTEMS:  Review of Systems  Constitutional: Positive for malaise/fatigue. Negative for chills and fever.  HENT: Negative for congestion, ear discharge, hearing loss and nosebleeds.   Eyes: Negative for blurred vision and double vision.  Respiratory: Positive for shortness of breath. Negative for cough and wheezing.   Cardiovascular: Positive for leg swelling. Negative for chest pain and palpitations.  Gastrointestinal: Negative for abdominal pain, constipation, diarrhea, nausea and vomiting.  Genitourinary: Positive for frequency. Negative for dysuria.  Neurological: Negative for dizziness, speech change, focal weakness, seizures and headaches.  Psychiatric/Behavioral: Negative for depression.    DRUG ALLERGIES:  No Known Allergies  VITALS:  Blood pressure (!) 147/86, pulse 84, temperature 98.2 F (36.8 C), temperature source Oral, resp. rate 14, height 5\' 11"  (1.803 m), weight 114.8 kg (253 lb), SpO2 97 %.  PHYSICAL EXAMINATION:  Physical Exam  GENERAL:  61 y.o.-year-old patient lying in the bed with no acute distress.  EYES: Pupils equal, round, reactive to light and accommodation. No scleral icterus. Extraocular muscles intact.  HEENT: Head atraumatic, normocephalic. Oropharynx and nasopharynx clear.  NECK:  Supple, no jugular venous distention. No thyroid enlargement, no tenderness.  LUNGS: Normal breath sounds bilaterally, no wheezing, rales,rhonchi or crepitation. No use of accessory muscles of respiration. Fine bibasilar crackles. CARDIOVASCULAR: S1, S2 normal. No murmurs, rubs, or gallops.  ABDOMEN: Soft, nontender,  nondistended. Bowel sounds present. No organomegaly or mass.  EXTREMITIES: No  cyanosis, or clubbing.  3+ tense lower extremity edema, nonpitting NEUROLOGIC: Cranial nerves II through XII are intact. Muscle strength 5/5 in all extremities. Sensation intact. Gait not checked.  PSYCHIATRIC: The patient is alert and oriented x 3.  SKIN: No obvious rash, lesion, or ulcer.    LABORATORY PANEL:   CBC  Recent Labs Lab 07/24/16 0816  WBC 5.8  HGB 11.4*  HCT 35.7*  PLT 150   ------------------------------------------------------------------------------------------------------------------  Chemistries   Recent Labs Lab 07/26/16 0547  NA 136  K 3.8  CL 102  CO2 26  GLUCOSE 145*  BUN 16  CREATININE 1.20  CALCIUM 8.8*  MG 1.9   ------------------------------------------------------------------------------------------------------------------  Cardiac Enzymes  Recent Labs Lab 07/24/16 1505  TROPONINI 0.06*   ------------------------------------------------------------------------------------------------------------------  RADIOLOGY:  No results found.  EKG:   Orders placed or performed during the hospital encounter of 07/23/16  . ED EKG within 10 minutes  . ED EKG within 10 minutes    ASSESSMENT AND PLAN:   61 year old male with past medical history significant for CAD status post stents, CHF, COPD, diabetes and hypertension presents to hospital secondary to anasarca  #1 acute on chronic systolic CHF exacerbation-echocardiogram showing significantly reduced EF, 25% -Appreciate cardiology consult. Continue IV Lasix. Diuresed almost 7 L since admission. -Right and left Heart cardiac catheterization on Monday -Continue cardiac medications including aspirin, statin, Coreg, Plavix, this in April and Aldactone has been added - PVCs could be related to his cardiomyopathy - no acute indication for ICD- outpt eval recommended  #2 CAD-status post prior stents. Continue  cardiac medications. -Cardiac catheterization on Monday  #3 hypertension-continue medications as mentioned above  #4 diabetes mellitus-on sliding scale insulin. Check  A1c  #5 DVT prophylaxis-on Lovenox     All the records are reviewed and case discussed with Care Management/Social Workerr. Management plans discussed with the patient, family and they are in agreement.  CODE STATUS: Full code  TOTAL TIME TAKING CARE OF THIS PATIENT: 38 minutes.   POSSIBLE D/C IN 2-3 DAYS, DEPENDING ON CLINICAL CONDITION.   Enid BaasKALISETTI,Rey Dansby M.D on 07/26/2016 at 1:21 PM  Between 7am to 6pm - Pager - [phone]  After 6pm go to [email or web address] - Social research officer, [email or web address] [email or web address]  [facility] [place] [facility]  Office  [phone]  CC: Primary care physician; [facility]

## 2016-07-27 LAB — BASIC METABOLIC PANEL
Anion gap: 7 (ref 5–15)
BUN: 16 mg/dL (ref 6–20)
CALCIUM: 8.7 mg/dL — AB (ref 8.9–10.3)
CHLORIDE: 103 mmol/L (ref 101–111)
CO2: 27 mmol/L (ref 22–32)
CREATININE: 0.99 mg/dL (ref 0.61–1.24)
GFR calc non Af Amer: >60 mL/min (ref >60)
Glucose, Bld: 120 mg/dL — ABNORMAL HIGH (ref 65–99)
Potassium: 3.8 mmol/L (ref 3.5–5.1)
SODIUM: 137 mmol/L (ref 135–145)

## 2016-07-27 LAB — GLUCOSE, CAPILLARY
GLUCOSE-CAPILLARY: 114 mg/dL — AB (ref 65–99)
GLUCOSE-CAPILLARY: 186 mg/dL — AB (ref 65–99)
Glucose-Capillary: 174 mg/dL — ABNORMAL HIGH (ref 65–99)
Glucose-Capillary: 186 mg/dL — ABNORMAL HIGH (ref 65–99)

## 2016-07-27 NOTE — Progress Notes (Signed)
Patient Name: [name]      SUBJECTIVE: 61 year old gentleman admitted with progressive shortness of breath in the context of hypertension known coronary artery disease with prior PCI obesity diabetes and poor compliance. He has had significant interval worsening of LV systolic function 2009--55%>>>>> 25-30%. Catheterization is anticipated for Monday. No chest pain or shortness of breath   Voices understanding of the plan and situation   Past Medical History:  Diagnosis Date  . CAD in native artery    a. stress echo 12/2007 abnl, EF > 55%, b. LHC 01/28/08: mLAD 30, D1 40, dLCx 70, pRCA 30, mRCA 70, mRCA lesion 2 80, PDA 90, s/p PCI/BMS to prox and distal RCA, s/p PCI/BMS to PDA; c. patient reports PCI/stenting x 2 in early 2017 at the [facility] (no records on file)  . Chronic diastolic CHF (congestive heart failure) (HCC)    a. echo 2009: > 55%  . COPD (chronic obstructive pulmonary disease) (HCC)   . Gout   . Hyperlipidemia   . Hypertensive heart disease   . Obesity   . Tobacco abuse     Scheduled Meds:  Scheduled Meds: . amLODipine  10 mg Oral Daily  . aspirin  81 mg Oral Daily  . atorvastatin  80 mg Oral Daily  . carvedilol  12.5 mg Oral BID WC  . clopidogrel  75 mg Oral Daily  . guaiFENesin  600 mg Oral BID   And  . dextromethorphan  30 mg Oral BID  . enoxaparin (LOVENOX) injection  40 mg Subcutaneous Q24H  . furosemide  60 mg Intravenous BID  . insulin aspart  0-5 Units Subcutaneous QHS  . insulin aspart  0-9 Units Subcutaneous TID WC  . isosorbide-hydrALAZINE  1 tablet Oral TID  . lisinopril  40 mg Oral Daily  . magnesium oxide  400 mg Oral Daily  . mometasone-formoterol  2 puff Inhalation BID  . potassium chloride  30 mEq Oral BID  . sodium chloride flush  3 mL Intravenous Q12H  . spironolactone  25 mg Oral Daily  . traZODone  50 mg Oral QHS   Continuous Infusions: acetaminophen **OR** acetaminophen, hydrALAZINE, ipratropium-albuterol, labetalol,  nitroGLYCERIN, ondansetron **OR** ondansetron (ZOFRAN) IV, oxyCODONE-acetaminophen    PHYSICAL EXAM Vitals:   07/26/16 1927 07/27/16 0349 07/27/16 0433 07/27/16 0802  BP: (!) 134/91 128/76  (!) 148/102  Pulse: 82 85  80  Resp: 19 16  18   Temp: 97.8 F (36.6 C) 98 F (36.7 C)  98.5 F (36.9 C)  TempSrc: Oral   Oral  SpO2: 100% 99%  97%  Weight:   245 lb 9.6 oz (111.4 kg)   Height:      Well developed and nourished in no acute distress HENT normal Neck supple with JVP-flat Clear Regular rate and rhythm, no murmurs or gallops Abd-soft with active BS No Clubbing cyanosis edema Skin-warm and dry A & Oriented  Grossly normal sensory and motor function  TELEMETRY: Reviewed personnally pt in  Sinus with VTNS ECG personally reviewed sinus with lowvoltage and freq PVCs  Intake/Output Summary (Last 24 hours) at 07/27/16 1221 Last data filed at 07/27/16 0900  Gross per 24 hour  Intake              480 ml  Output              700 ml  Net             -220 ml  LABS: Basic Metabolic Panel:  Recent Labs Lab 07/23/16 2109 07/24/16 0816 07/25/16 0939 07/26/16 0547 07/27/16 0613  NA 139 140 138 136 137  K 4.0 3.8 3.4* 3.8 3.8  CL 104 104 102 102 103  CO2 28 29 29 26 27   GLUCOSE 124* 137* 173* 145* 120*  BUN 13 13 14 16 16   CREATININE 1.20 1.15 1.07 1.20 0.99  CALCIUM 9.1 8.9 8.6* 8.8* 8.7*  MG  --  2.0  --  1.9  --    Cardiac Enzymes:  Recent Labs  07/24/16 1505  TROPONINI 0.06*   CBC:  Recent Labs Lab 07/23/16 2109 07/24/16 0816  WBC 5.6 5.8  HGB 11.4* 11.4*  HCT 37.1* 35.7*  MCV 79.0* 78.0*  PLT 152 150   PROTIME: No results for input(s): LABPROT, INR in the last 72 hours. Liver Function Tests: No results for input(s): AST, ALT, ALKPHOS, BILITOT, PROT, ALBUMIN in the last 72 hours. No results for input(s): LIPASE, AMYLASE in the last 72 hours. BNP: BNP (last 3 results)  Recent Labs  07/23/16 2109  BNP 1,533.0*    ProBNP (last 3 results) No  results for input(s): PROBNP in the last 8760 hours.  D-Dimer: No results for input(s): DDIMER in the last 72 hours. Hemoglobin A1C: No results for input(s): HGBA1C in the last 72 hours. Fasting Lipid Panel: No results for input(s): CHOL, HDL, LDLCALC, TRIG, CHOLHDL, LDLDIRECT in the last 72 hours. Thyroid Function Tests: No results for input(s): TSH, T4TOTAL, T3FREE, THYROIDAB in the last 72 hours.  Invalid input(s): FREET3  ASSESSMENT AND PLAN:  Principal Problem:  Ischemic cardiomyopathy   Active Problems:     CAD (coronary artery disease)   HLD (hyperlipidemia)   Acute on chronic systolic CHF (congestive heart failure), NYHA class 3 (HCC)   Tobacco abuse   PVCs   Hypertensive heart disease   Non-sustained ventricular tachycardia (HCC)  Catheterization is indicated to investigate the cause of his cardiomyopathy.    Aggressive medical therapy is indicated in this young man. In the event that his LV function does not improve ICD therapy for primary prevention is appropriate to consider  Have added  Bidil given elevated blood pressure  Somewhat better today  atorva Increased to high intensity therapy  PVC burden is about 8/min which translates to about 11K /day and about 10% of his beats Hence we need to undertake therapy to try to reduce his PVCs burden to answer the question as to whether it is contributing to cardiomyopathy.  First however I would exclude an ischemic trigger for ectopy  If nothing found at cath would begin mexilitene 200 bid  With outpt holter in 3-4 weeks with followup with me     Signed, Sherryl MangesSteven Marshayla Mitschke MD  07/27/2016'

## 2016-07-27 NOTE — Progress Notes (Signed)
9 beat of VT this AM, patient is asymptomatic and walking around the nurses station. Dr. Sheryle Hailiamond notified with no new order. Will continue to monitor.

## 2016-07-27 NOTE — Progress Notes (Signed)
[employer] - [place] [facility]   PATIENT NAME: [name]    MR#:  [id number]  DATE OF BIRTH:  [birth year]  SUBJECTIVE:  CHIEF COMPLAINT:   Chief Complaint  Patient presents with  . Chest Pain   -Feels about the same.continues to have asymptomatic beats of nonsustained V. tach last night. -For cardiac catheterization Monday  REVIEW OF SYSTEMS:  Review of Systems  Constitutional: Positive for malaise/fatigue. Negative for chills and fever.  HENT: Negative for congestion, ear discharge, hearing loss and nosebleeds.   Eyes: Negative for blurred vision and double vision.  Respiratory: Positive for shortness of breath. Negative for cough and wheezing.   Cardiovascular: Positive for leg swelling. Negative for chest pain and palpitations.  Gastrointestinal: Negative for abdominal pain, constipation, diarrhea, nausea and vomiting.  Genitourinary: Positive for frequency. Negative for dysuria.  Neurological: Negative for dizziness, speech change, focal weakness, seizures and headaches.  Psychiatric/Behavioral: Negative for depression.    DRUG ALLERGIES:  No Known Allergies  VITALS:  Blood pressure (!) 148/102, pulse 80, temperature 98.5 F (36.9 C), temperature source Oral, resp. rate 18, height 5\' 11"  (1.803 m), weight 111.4 kg (245 lb 9.6 oz), SpO2 97 %.  PHYSICAL EXAMINATION:  Physical Exam  GENERAL:  61 y.o.-year-old patient lying in the bed with no acute distress.  EYES: Pupils equal, round, reactive to light and accommodation. No scleral icterus. Extraocular muscles intact.  HEENT: Head atraumatic, normocephalic. Oropharynx and nasopharynx clear.  NECK:  Supple, no jugular venous distention. No thyroid enlargement, no tenderness.  LUNGS: Normal breath sounds bilaterally, no wheezing, rales,rhonchi or crepitation. No use of accessory muscles of respiration. Fine bibasilar crackles. CARDIOVASCULAR: S1, S2 normal. No murmurs, rubs, or gallops.    ABDOMEN: Soft, nontender, nondistended. Bowel sounds present. No organomegaly or mass.  EXTREMITIES: No  cyanosis, or clubbing.  3+ tense lower extremity edema, nonpitting NEUROLOGIC: Cranial nerves II through XII are intact. Muscle strength 5/5 in all extremities. Sensation intact. Gait not checked.  PSYCHIATRIC: The patient is alert and oriented x 3.  SKIN: No obvious rash, lesion, or ulcer.    LABORATORY PANEL:   CBC  Recent Labs Lab 07/24/16 0816  WBC 5.8  HGB 11.4*  HCT 35.7*  PLT 150   ------------------------------------------------------------------------------------------------------------------  Chemistries   Recent Labs Lab 07/26/16 0547 07/27/16 0613  NA 136 137  K 3.8 3.8  CL 102 103  CO2 26 27  GLUCOSE 145* 120*  BUN 16 16  CREATININE 1.20 0.99  CALCIUM 8.8* 8.7*  MG 1.9  --    ------------------------------------------------------------------------------------------------------------------  Cardiac Enzymes  Recent Labs Lab 07/24/16 1505  TROPONINI 0.06*   ------------------------------------------------------------------------------------------------------------------  RADIOLOGY:  No results found.  EKG:   Orders placed or performed during the hospital encounter of 07/23/16  . ED EKG within 10 minutes  . ED EKG within 10 minutes    ASSESSMENT AND PLAN:   61 year old male with past medical history significant for CAD status post stents, CHF, COPD, diabetes and hypertension presents to hospital secondary to anasarca  #1 acute on chronic systolic CHF exacerbation-echocardiogram showing significantly reduced EF, 25% -Appreciate cardiology consult. Continue IV Lasix. Diuresed almost 7 L since admission. -Right and left Heart cardiac catheterization on Monday -Continue cardiac medications including aspirin, statin, Coreg, Plavix, this in April and Aldactone has been added - PVCs could be related to his cardiomyopathy - might need ICD-  outpt eval recommended  #2 CAD-status post prior stents. Continue cardiac medications. -Cardiac catheterization on Monday  #  3 hypertension-continue medications as mentioned above  #4 diabetes mellitus-on sliding scale insulin. Check A1c  #5 DVT prophylaxis-on Lovenox     All the records are reviewed and case discussed with Care Management/Social Workerr. Management plans discussed with the patient, family and they are in agreement.  CODE STATUS: Full code  TOTAL TIME TAKING CARE OF THIS PATIENT: 32 minutes.   POSSIBLE D/C IN 1-2 DAYS, DEPENDING ON CLINICAL CONDITION.   Enid BaasKALISETTI,Fields Oros M.D on 07/27/2016 at 12:19 PM  Between 7am to 6pm - Pager - [phone]  After 6pm go to [email or web address] - Social research officer, governmentpassword EPAS [facility]  [facility] [place] [facility]  Office  [phone]  CC: Primary care physician; [facility]

## 2016-07-28 ENCOUNTER — Encounter
Admission: EM | Disposition: A | Discharge status: Home / Self Care | Payer: Self-pay | Source: Home / Self Care | Attending: Internal Medicine

## 2016-07-28 ENCOUNTER — Encounter: Payer: Self-pay | Admitting: Cardiovascular Disease

## 2016-07-28 DIAGNOSIS — I2510 Atherosclerotic heart disease of native coronary artery without angina pectoris: Secondary | ICD-10-CM

## 2016-07-28 DIAGNOSIS — I5023 Acute on chronic systolic (congestive) heart failure: Secondary | ICD-10-CM

## 2016-07-28 DIAGNOSIS — I493 Ventricular premature depolarization: Secondary | ICD-10-CM

## 2016-07-28 HISTORY — PX: CARDIAC CATHETERIZATION: SHX172

## 2016-07-28 LAB — BASIC METABOLIC PANEL
ANION GAP: 8 (ref 5–15)
BUN: 17 mg/dL (ref 6–20)
CALCIUM: 8.9 mg/dL (ref 8.9–10.3)
CO2: 25 mmol/L (ref 22–32)
Chloride: 105 mmol/L (ref 101–111)
Creatinine, Ser: 0.9 mg/dL (ref 0.61–1.24)
Glucose, Bld: 115 mg/dL — ABNORMAL HIGH (ref 65–99)
POTASSIUM: 4.1 mmol/L (ref 3.5–5.1)
SODIUM: 138 mmol/L (ref 135–145)

## 2016-07-28 LAB — GLUCOSE, CAPILLARY
GLUCOSE-CAPILLARY: 115 mg/dL — AB (ref 65–99)
GLUCOSE-CAPILLARY: 213 mg/dL — AB (ref 65–99)
GLUCOSE-CAPILLARY: 162 mg/dL — AB (ref 65–99)
GLUCOSE-CAPILLARY: 176 mg/dL — AB (ref 65–99)

## 2016-07-28 SURGERY — RIGHT AND LEFT HEART CATH
Anesthesia: Moderate Sedation

## 2016-07-28 MED ORDER — HEPARIN SODIUM (PORCINE) 1000 UNIT/ML IJ SOLN
INTRAMUSCULAR | Status: DC | PRN
  Administered 2016-07-28: 6000 [IU] via INTRAVENOUS

## 2016-07-28 MED ORDER — MIDAZOLAM HCL 2 MG/2ML IJ SOLN
INTRAMUSCULAR | Status: DC | PRN
  Administered 2016-07-28: 1 mg via INTRAVENOUS

## 2016-07-28 MED ORDER — SODIUM CHLORIDE 0.9% FLUSH
3.0000 mL | Freq: Two times a day (BID) | INTRAVENOUS | Status: DC
  Administered 2016-07-28 – 2016-07-31 (×7): 3 mL via INTRAVENOUS

## 2016-07-28 MED ORDER — FENTANYL CITRATE (PF) 100 MCG/2ML IJ SOLN
INTRAMUSCULAR | Status: AC
  Filled 2016-07-28: qty 2

## 2016-07-28 MED ORDER — FENTANYL CITRATE (PF) 100 MCG/2ML IJ SOLN
INTRAMUSCULAR | Status: DC | PRN
  Administered 2016-07-28: 25 ug via INTRAVENOUS

## 2016-07-28 MED ORDER — HEPARIN SODIUM (PORCINE) 1000 UNIT/ML IJ SOLN
INTRAMUSCULAR | Status: AC
  Filled 2016-07-28: qty 1

## 2016-07-28 MED ORDER — SODIUM CHLORIDE 0.9% FLUSH: 3.0000 mL | INTRAVENOUS | Status: DC | PRN

## 2016-07-28 MED ORDER — SODIUM CHLORIDE 0.9 % IV SOLN: 250.0000 mL | INTRAVENOUS | Status: DC | PRN

## 2016-07-28 MED ORDER — IOPAMIDOL (ISOVUE-300) INJECTION 61%
INTRAVENOUS | Status: DC | PRN
  Administered 2016-07-28: 80 mL via INTRA_ARTERIAL

## 2016-07-28 MED ORDER — VERAPAMIL HCL 2.5 MG/ML IV SOLN
INTRAVENOUS | Status: AC
  Filled 2016-07-28: qty 2

## 2016-07-28 MED ORDER — HEPARIN (PORCINE) IN NACL 2-0.9 UNIT/ML-% IJ SOLN
INTRAMUSCULAR | Status: AC
  Filled 2016-07-28: qty 1000

## 2016-07-28 MED ORDER — MIDAZOLAM HCL 2 MG/2ML IJ SOLN
INTRAMUSCULAR | Status: AC
  Filled 2016-07-28: qty 2

## 2016-07-28 SURGICAL SUPPLY — 12 items
CATH BALLN WEDGE 5F 110CM (CATHETERS) ×3 IMPLANT
CATH INFINITI 5 FR JL3.5 (CATHETERS) ×3 IMPLANT
CATH OPTITORQUE JACKY 4.0 5F (CATHETERS) ×3 IMPLANT
DEVICE RAD COMP TR BAND LRG (VASCULAR PRODUCTS) ×3 IMPLANT
GLIDESHEATH SLEND SS 6F .021 (SHEATH) ×6 IMPLANT
GUIDEWIRE EMER 3M J .025X150CM (WIRE) ×3 IMPLANT
KIT MANI 3VAL PERCEP (MISCELLANEOUS) ×3 IMPLANT
KIT RIGHT HEART (MISCELLANEOUS) ×3 IMPLANT
PACK CARDIAC CATH (CUSTOM PROCEDURE TRAY) ×3 IMPLANT
SHEATH PINNACLE 5F 10CM (SHEATH) IMPLANT
WIRE ROSEN-J .035X260CM (WIRE) ×3 IMPLANT
WIRE RUNTHROUGH .014X180CM (WIRE) ×3 IMPLANT

## 2016-07-28 NOTE — Plan of Care (Signed)
Problem: Cardiac: Goal: Ability to achieve and maintain adequate cardiopulmonary perfusion will improve Outcome: Progressing Cardiac cath completed.  No severe block found.  Patient aware of finding.  Need to determine source of his chest pain.

## 2016-07-28 NOTE — Progress Notes (Signed)
Returned from cath. R wrist site is clean, surrounds skin is soft.  Pulse is strong.  Not post cath hydration ordered. Confirmed this with Dr. Kirke CorinArida

## 2016-07-28 NOTE — Progress Notes (Signed)
States his pain, post medication is a 7 of 10.  I confirmed with him that his pain was no better.  "I'm not in pain any more, I'm just wozzy."

## 2016-07-28 NOTE — H&P (View-Only) (Signed)
Patient Name: [name]      SUBJECTIVE: 61 year old gentleman admitted with progressive shortness of breath in the context of hypertension known coronary artery disease with prior PCI obesity diabetes and poor compliance. He has had significant interval worsening of LV systolic function 2009--55%>>>>> 25-30%. Catheterization is anticipated for Monday. No chest pain or shortness of breath   Voices understanding of the plan and situation   Past Medical History:  Diagnosis Date  . CAD in native artery    a. stress echo 12/2007 abnl, EF > 55%, b. LHC 01/28/08: mLAD 30, D1 40, dLCx 70, pRCA 30, mRCA 70, mRCA lesion 2 80, PDA 90, s/p PCI/BMS to prox and distal RCA, s/p PCI/BMS to PDA; c. patient reports PCI/stenting x 2 in early 2017 at the [facility] (no records on file)  . Chronic diastolic CHF (congestive heart failure) (HCC)    a. echo 2009: > 55%  . COPD (chronic obstructive pulmonary disease) (HCC)   . Gout   . Hyperlipidemia   . Hypertensive heart disease   . Obesity   . Tobacco abuse     Scheduled Meds:  Scheduled Meds: . amLODipine  10 mg Oral Daily  . aspirin  81 mg Oral Daily  . atorvastatin  80 mg Oral Daily  . carvedilol  12.5 mg Oral BID WC  . clopidogrel  75 mg Oral Daily  . guaiFENesin  600 mg Oral BID   And  . dextromethorphan  30 mg Oral BID  . enoxaparin (LOVENOX) injection  40 mg Subcutaneous Q24H  . furosemide  60 mg Intravenous BID  . insulin aspart  0-5 Units Subcutaneous QHS  . insulin aspart  0-9 Units Subcutaneous TID WC  . isosorbide-hydrALAZINE  1 tablet Oral TID  . lisinopril  40 mg Oral Daily  . magnesium oxide  400 mg Oral Daily  . mometasone-formoterol  2 puff Inhalation BID  . potassium chloride  30 mEq Oral BID  . sodium chloride flush  3 mL Intravenous Q12H  . spironolactone  25 mg Oral Daily  . traZODone  50 mg Oral QHS   Continuous Infusions: acetaminophen **OR** acetaminophen, hydrALAZINE, ipratropium-albuterol, labetalol,  nitroGLYCERIN, ondansetron **OR** ondansetron (ZOFRAN) IV, oxyCODONE-acetaminophen    PHYSICAL EXAM Vitals:   07/26/16 1927 07/27/16 0349 07/27/16 0433 07/27/16 0802  BP: (!) 134/91 128/76  (!) 148/102  Pulse: 82 85  80  Resp: 19 16  18   Temp: 97.8 F (36.6 C) 98 F (36.7 C)  98.5 F (36.9 C)  TempSrc: Oral   Oral  SpO2: 100% 99%  97%  Weight:   245 lb 9.6 oz (111.4 kg)   Height:      Well developed and nourished in no acute distress HENT normal Neck supple with JVP-flat Clear Regular rate and rhythm, no murmurs or gallops Abd-soft with active BS No Clubbing cyanosis edema Skin-warm and dry A & Oriented  Grossly normal sensory and motor function  TELEMETRY: Reviewed personnally pt in  Sinus with VTNS ECG personally reviewed sinus with lowvoltage and freq PVCs  Intake/Output Summary (Last 24 hours) at 07/27/16 1221 Last data filed at 07/27/16 0900  Gross per 24 hour  Intake              480 ml  Output              700 ml  Net             -220 ml  LABS: Basic Metabolic Panel:  Recent Labs Lab 07/23/16 2109 07/24/16 0816 07/25/16 0939 07/26/16 0547 07/27/16 0613  NA 139 140 138 136 137  K 4.0 3.8 3.4* 3.8 3.8  CL 104 104 102 102 103  CO2 28 29 29 26 27   GLUCOSE 124* 137* 173* 145* 120*  BUN 13 13 14 16 16   CREATININE 1.20 1.15 1.07 1.20 0.99  CALCIUM 9.1 8.9 8.6* 8.8* 8.7*  MG  --  2.0  --  1.9  --    Cardiac Enzymes:  Recent Labs  07/24/16 1505  TROPONINI 0.06*   CBC:  Recent Labs Lab 07/23/16 2109 07/24/16 0816  WBC 5.6 5.8  HGB 11.4* 11.4*  HCT 37.1* 35.7*  MCV 79.0* 78.0*  PLT 152 150   PROTIME: No results for input(s): LABPROT, INR in the last 72 hours. Liver Function Tests: No results for input(s): AST, ALT, ALKPHOS, BILITOT, PROT, ALBUMIN in the last 72 hours. No results for input(s): LIPASE, AMYLASE in the last 72 hours. BNP: BNP (last 3 results)  Recent Labs  07/23/16 2109  BNP 1,533.0*    ProBNP (last 3 results) No  results for input(s): PROBNP in the last 8760 hours.  D-Dimer: No results for input(s): DDIMER in the last 72 hours. Hemoglobin A1C: No results for input(s): HGBA1C in the last 72 hours. Fasting Lipid Panel: No results for input(s): CHOL, HDL, LDLCALC, TRIG, CHOLHDL, LDLDIRECT in the last 72 hours. Thyroid Function Tests: No results for input(s): TSH, T4TOTAL, T3FREE, THYROIDAB in the last 72 hours.  Invalid input(s): FREET3  ASSESSMENT AND PLAN:  Principal Problem:  Ischemic cardiomyopathy   Active Problems:     CAD (coronary artery disease)   HLD (hyperlipidemia)   Acute on chronic systolic CHF (congestive heart failure), NYHA class 3 (HCC)   Tobacco abuse   PVCs   Hypertensive heart disease   Non-sustained ventricular tachycardia (HCC)  Catheterization is indicated to investigate the cause of his cardiomyopathy.    Aggressive medical therapy is indicated in this young man. In the event that his LV function does not improve ICD therapy for primary prevention is appropriate to consider  Have added  Bidil given elevated blood pressure  Somewhat better today  atorva Increased to high intensity therapy  PVC burden is about 8/min which translates to about 11K /day and about 10% of his beats Hence we need to undertake therapy to try to reduce his PVCs burden to answer the question as to whether it is contributing to cardiomyopathy.  First however I would exclude an ischemic trigger for ectopy  If nothing found at cath would begin mexilitene 200 bid  With outpt holter in 3-4 weeks with followup with me     Signed, Sherryl MangesSteven Brinley Rosete MD  07/27/2016'

## 2016-07-28 NOTE — Progress Notes (Signed)
[facility] to [facility] tele[facility].  Keep right wrist elevated on pillow above the heart for today.  Watch right wrist for evidence of bleeding or hematoma.. If bleeding or hematoma noted, hold pressure over the site for at least 15 minutes and notify the physician.  No blending or flexing of the wrist--no lifting for the remainder of the day or for 2 weeks after your procedure. Notify the physician for evidence of infection or if you get a temperature.

## 2016-07-28 NOTE — Interval H&P Note (Signed)
Cath Lab Visit (complete for each Cath Lab visit)  Clinical Evaluation Leading to the Procedure:   ACS: Yes.    Non-ACS:    n/a    History and Physical Interval Note:  07/28/2016 8:23 AM  [name]  has presented today for surgery, with the diagnosis of acute on chronic systolic heart failure  The various methods of treatment have been discussed with the patient and family. After consideration of risks, benefits and other options for treatment, the patient has consented to  Procedure(s): Right and Left Heart Cath and possible PCI (N/A) as a surgical intervention .  The patient's history has been reviewed, patient examined, no change in status, stable for surgery.  I have reviewed the patient's chart and labs.  Questions were answered to the patient's satisfaction.     Lorine BearsMuhammad Danijela Vessey

## 2016-07-28 NOTE — Care Management (Signed)
[facility]

## 2016-07-28 NOTE — Progress Notes (Signed)
Progress Note  Patient Name: [name] Date of Encounter: 07/28/2016  Primary Cardiologist: [facility].  Subjective   He had cardiac catheterization today which showed patent stents with no obstructive disease. Right heart catheterization showed severely elevated filling pressures and severe pulmonary hypertension in spite of multiple days of diuresis. The patient overall feels better.  Inpatient Medications    Scheduled Meds: . amLODipine  10 mg Oral Daily  . aspirin  81 mg Oral Daily  . atorvastatin  80 mg Oral Daily  . carvedilol  12.5 mg Oral BID WC  . clopidogrel  75 mg Oral Daily  . guaiFENesin  600 mg Oral BID   And  . dextromethorphan  30 mg Oral BID  . enoxaparin (LOVENOX) injection  40 mg Subcutaneous Q24H  . furosemide  60 mg Intravenous BID  . insulin aspart  0-5 Units Subcutaneous QHS  . insulin aspart  0-9 Units Subcutaneous TID WC  . isosorbide-hydrALAZINE  1 tablet Oral TID  . lisinopril  40 mg Oral Daily  . magnesium oxide  400 mg Oral Daily  . mometasone-formoterol  2 puff Inhalation BID  . potassium chloride  30 mEq Oral BID  . sodium chloride flush  3 mL Intravenous Q12H  . sodium chloride flush  3 mL Intravenous Q12H  . spironolactone  25 mg Oral Daily  . traZODone  50 mg Oral QHS   PRN Meds: sodium chloride, acetaminophen **OR** acetaminophen, hydrALAZINE, ipratropium-albuterol, labetalol, nitroGLYCERIN, ondansetron **OR** ondansetron (ZOFRAN) IV, oxyCODONE-acetaminophen, sodium chloride flush   Vital Signs    Vitals:   07/28/16 1045 07/28/16 1100 07/28/16 1115 07/28/16 1146  BP: (!) 142/89 (!) 151/92 (!) 159/80 138/84  Pulse:  86 75 72  Resp:  20 (!) 0 (!) 22  Temp:    97.4 F (36.3 C)  TempSrc:    Oral  SpO2:  100% 100% 95%  Weight:      Height:        Intake/Output Summary (Last 24 hours) at 07/28/16 1647 Last data filed at 07/28/16 1300  Gross per 24 hour  Intake              240 ml  Output             1400 ml  Net             -1160 ml   Filed Weights   07/26/16 0347 07/27/16 0433 07/28/16 0418  Weight: 253 lb (114.8 kg) 245 lb 9.6 oz (111.4 kg) 242 lb 12.8 oz (110.1 kg)    Physical Exam   GEN: Well nourished, well developed, in no acute distress.  HEENT: Grossly normal.  Neck: Supple, no carotid bruits, or masses. mild JVD Cardiac: Irreg - freq ectopy, no murmurs, rubs, or gallops. No clubbing, cyanosis, 3+ bilat LE edema to mid thighs.  Radials/DP/PT 2+ and equal bilaterally.  Respiratory:  Respirations regular and unlabored, diminished breath sounds bilat. GI: obese, protuberant, firm, nontender, BS + x 4. MS: no deformity or atrophy. Skin: warm and dry, no rash. Neuro:  AAOx3.  Strength and sensation are intact. Psych: Normal affect.  Labs    Chemistry  Recent Labs Lab 07/26/16 0547 07/27/16 0613 07/28/16 0448  NA 136 137 138  K 3.8 3.8 4.1  CL 102 103 105  CO2 26 27 25   GLUCOSE 145* 120* 115*  BUN 16 16 17   CREATININE 1.20 0.99 0.90  CALCIUM 8.8* 8.7* 8.9  GFRNONAA >60 >60 >60  GFRAA >  60 >60 >60  ANIONGAP 8 7 8      Hematology  Recent Labs Lab 07/23/16 2109 07/24/16 0816  WBC 5.6 5.8  RBC 4.70 4.58  HGB 11.4* 11.4*  HCT 37.1* 35.7*  MCV 79.0* 78.0*  MCH 24.3* 25.0*  MCHC 30.8* 32.0  RDW 19.0* 18.7*  PLT 152 150    Cardiac Enzymes  Recent Labs Lab 07/23/16 2109 07/24/16 0251 07/24/16 0816 07/24/16 1505  TROPONINI 0.05* 0.06* 0.07* 0.06*     BNP  Recent Labs Lab 07/23/16 2109  BNP 1,533.0*     Radiology    No results found.  Telemetry    RSR, pvc's, up to 3 beats nsvt - Personally Reviewed  Cardiac Studies   Echo pending  Patient Profile     61 y.o. male with h/o CAD s/p PCI/BMS to proximal and distal RCA as well as PCI/BMS to PDA on 01/28/2008 in the setting of unstable angina and abnormal stress echo with an EF > 55% at that time. Also with history of chronic diastolic CHF 2/2 poorly controlled blood pressure, COPD 2/2 tobacco abuse,  hypertensive heart disease, DM, HLD, and obesity who [facility] with a > 1 month history of worsening SOB and weight gain of > 20 pounds in 1 month.  Assessment & Plan    1.  Acute on chronic Systolic heart failure: Ejection fraction was 25%. Cardiac catheterization today showed patent stents with no obstructive coronary artery disease. He is down 20 pounds since presentation with diuresis. In spite of that, his LVEDP was 34 mmHg and mean pulmonary pressure was 57 mmHg. I recommend continuing IV diuresis for at least 2 days. The patient was switched to carvedilol and was started on spironolactone. He was also started on BiDil over the weekend. Continue up titration as tolerated.  2.  Hypertensive heart dzs w/chf:  Blood pressure improved with adjustment of medications  3.  CAD/elevated troponin:   Cardiac cath showed patent stents with no obstructive disease. Continue dual antiplatelet therapy.  4. Frequent PVCs: Could be responsible for worsening LV systolic function. Dr. Graciela HusbandsKlein recommended mexiletine. However, I am concerned about compliance and follow-up. It might be best to start this medication in the outpatient setting upon follow-up.  5.  HL:  LDL 48.  Cont statin.  Signed, Lorine BearsMuhammad Arida, MD  07/28/2016, 4:47 PM

## 2016-07-28 NOTE — Progress Notes (Signed)
[employer] - [place] at [facility]   PATIENT NAME: James CluckSamuel Harrington    MR#:  [id number]  DATE OF BIRTH:  12-12-1955  SUBJECTIVE:  CHIEF COMPLAINT:   Chief Complaint  Patient presents with  . Chest Pain   -no complaints - had cardiac catheterization today- on IV lasix  REVIEW OF SYSTEMS:  Review of Systems  Constitutional: Positive for malaise/fatigue. Negative for chills and fever.  HENT: Negative for congestion, ear discharge, hearing loss and nosebleeds.   Eyes: Negative for blurred vision and double vision.  Respiratory: Positive for shortness of breath. Negative for cough and wheezing.   Cardiovascular: Positive for leg swelling. Negative for chest pain and palpitations.  Gastrointestinal: Negative for abdominal pain, constipation, diarrhea, nausea and vomiting.  Genitourinary: Positive for frequency. Negative for dysuria.  Neurological: Negative for dizziness, speech change, focal weakness, seizures and headaches.  Psychiatric/Behavioral: Negative for depression.    DRUG ALLERGIES:  No Known Allergies  VITALS:  Blood pressure 138/84, pulse 72, temperature 97.4 F (36.3 C), temperature source Oral, resp. rate (!) 22, height 5\' 11"  (1.803 m), weight 110.1 kg (242 lb 12.8 oz), SpO2 95 %.  PHYSICAL EXAMINATION:  Physical Exam  GENERAL:  61 y.o.-year-old patient lying in the bed with no acute distress.  EYES: Pupils equal, round, reactive to light and accommodation. No scleral icterus. Extraocular muscles intact.  HEENT: Head atraumatic, normocephalic. Oropharynx and nasopharynx clear.  NECK:  Supple, no jugular venous distention. No thyroid enlargement, no tenderness.  LUNGS: Normal breath sounds bilaterally, no wheezing, rales,rhonchi or crepitation. No use of accessory muscles of respiration. Fine bibasilar crackles. CARDIOVASCULAR: S1, S2 normal. No murmurs, rubs, or gallops.  ABDOMEN: Soft, nontender, nondistended. Bowel sounds present. No  organomegaly or mass.  EXTREMITIES: No  cyanosis, or clubbing.  2+ tense lower extremity edema, NEUROLOGIC: Cranial nerves II through XII are intact. Muscle strength 5/5 in all extremities. Sensation intact. Gait not checked.  PSYCHIATRIC: The patient is alert and oriented x 3.  SKIN: No obvious rash, lesion, or ulcer.    LABORATORY PANEL:   CBC  Recent Labs Lab 07/24/16 0816  WBC 5.8  HGB 11.4*  HCT 35.7*  PLT 150   ------------------------------------------------------------------------------------------------------------------  Chemistries   Recent Labs Lab 07/26/16 0547  07/28/16 0448  NA 136  < > 138  K 3.8  < > 4.1  CL 102  < > 105  CO2 26  < > 25  GLUCOSE 145*  < > 115*  BUN 16  < > 17  CREATININE 1.20  < > 0.90  CALCIUM 8.8*  < > 8.9  MG 1.9  --   --   < > = values in this interval not displayed. ------------------------------------------------------------------------------------------------------------------  Cardiac Enzymes  Recent Labs Lab 07/24/16 1505  TROPONINI 0.06*   ------------------------------------------------------------------------------------------------------------------  RADIOLOGY:  No results found.  EKG:   Orders placed or performed during the hospital encounter of 07/23/16  . ED EKG within 10 minutes  . ED EKG within 10 minutes    ASSESSMENT AND PLAN:   61 year old male with past medical history significant for CAD status post stents, CHF, COPD, diabetes and hypertension presents to hospital secondary to anasarca  #1 acute on chronic systolic CHF exacerbation-echocardiogram showing significantly reduced EF, 25% -Appreciate cardiology consult. Continue IV Lasix. Diuresed almost 9 L since admission. -Right and left Heart cardiac catheterization showing non obstructive CAD, patent stents but severely elevated right sided pressures - continue diuresis for may be another 1-2 days  per cardiology -Continue cardiac medications  including aspirin, statin, Coreg, Plavix , lisinopril and Aldactone has been added - PVCs could be related to his cardiomyopathy - also Bidil added - might need ICD- outpt eval recommended  #2 CAD-status post prior stents. Continue cardiac medications. -Cardiac catheterization showing prior patent RCA and LCX stents, non obstructive CAD  #3 hypertension-continue medications as mentioned above  #4 diabetes mellitus-on sliding scale insulin. A1c 8.5 Add metformin for now  #5 DVT prophylaxis-on Lovenox     All the records are reviewed and case discussed with Care Management/Social Workerr. Management plans discussed with the patient, family and they are in agreement.  CODE STATUS: Full code  TOTAL TIME TAKING CARE OF THIS PATIENT: 32 minutes.   POSSIBLE D/C IN 2 DAYS, DEPENDING ON CLINICAL CONDITION.   Enid Baas M.D on 07/28/2016 at 1:31 PM  Between 7am to 6pm - Pager - [phone]  After 6pm go to [email or web address] - Social research officer, government  [facility]  Office  [phone]  CC: Primary care physician; [facility]

## 2016-07-29 DIAGNOSIS — Z720 Tobacco use: Secondary | ICD-10-CM

## 2016-07-29 DIAGNOSIS — R0603 Acute respiratory distress: Secondary | ICD-10-CM

## 2016-07-29 DIAGNOSIS — I25118 Atherosclerotic heart disease of native coronary artery with other forms of angina pectoris: Secondary | ICD-10-CM

## 2016-07-29 LAB — GLUCOSE, CAPILLARY
GLUCOSE-CAPILLARY: 123 mg/dL — AB (ref 65–99)
GLUCOSE-CAPILLARY: 183 mg/dL — AB (ref 65–99)
Glucose-Capillary: 161 mg/dL — ABNORMAL HIGH (ref 65–99)
Glucose-Capillary: 173 mg/dL — ABNORMAL HIGH (ref 65–99)

## 2016-07-29 LAB — BASIC METABOLIC PANEL
Anion gap: 6 (ref 5–15)
BUN: 17 mg/dL (ref 6–20)
CHLORIDE: 105 mmol/L (ref 101–111)
CO2: 26 mmol/L (ref 22–32)
Calcium: 9.2 mg/dL (ref 8.9–10.3)
Creatinine, Ser: 0.94 mg/dL (ref 0.61–1.24)
GFR calc Af Amer: >60 mL/min (ref >60)
GFR calc non Af Amer: >60 mL/min (ref >60)
GLUCOSE: 109 mg/dL — AB (ref 65–99)
POTASSIUM: 4.1 mmol/L (ref 3.5–5.1)
Sodium: 137 mmol/L (ref 135–145)

## 2016-07-29 MED ORDER — METFORMIN HCL 500 MG PO TABS
500.0000 mg | ORAL_TABLET | Freq: Two times a day (BID) | ORAL | Status: DC
  Administered 2016-07-29 – 2016-07-31 (×4): 500 mg via ORAL
  Filled 2016-07-29 (×4): qty 1

## 2016-07-29 NOTE — Progress Notes (Signed)
Cardiac cath site is blood free, surrounding tissue is soft and normal in color.  Dressing replaced with a bandaid.

## 2016-07-29 NOTE — Progress Notes (Signed)
Patient Name: [name] Date of Encounter: 07/29/2016  Primary Cardiologist: [facility] Problem List     Principal Problem:   Acute respiratory distress Active Problems:   Acute on chronic systolic CHF (congestive heart failure), NYHA class 3 (HCC)   Hypertensive heart disease   Tobacco abuse   Accelerated hypertension   COPD (chronic obstructive pulmonary disease) (HCC)   CAD (coronary artery disease)   HLD (hyperlipidemia)   Ischemic cardiomyopathy   Non-sustained ventricular tachycardia (HCC)     Subjective   Breathing much better. Down 28 pounds this admission. Status post R/LHC on 1/20 as below showing persistently elevated right heart pressures. Labs continue to improve with diuresis. LE swelling much improved. Appetite much improved.   Inpatient Medications    Scheduled Meds: . amLODipine  10 mg Oral Daily  . aspirin  81 mg Oral Daily  . atorvastatin  80 mg Oral Daily  . carvedilol  12.5 mg Oral BID WC  . clopidogrel  75 mg Oral Daily  . guaiFENesin  600 mg Oral BID   And  . dextromethorphan  30 mg Oral BID  . enoxaparin (LOVENOX) injection  40 mg Subcutaneous Q24H  . furosemide  60 mg Intravenous BID  . insulin aspart  0-5 Units Subcutaneous QHS  . insulin aspart  0-9 Units Subcutaneous TID WC  . isosorbide-hydrALAZINE  1 tablet Oral TID  . lisinopril  40 mg Oral Daily  . magnesium oxide  400 mg Oral Daily  . mometasone-formoterol  2 puff Inhalation BID  . potassium chloride  30 mEq Oral BID  . sodium chloride flush  3 mL Intravenous Q12H  . sodium chloride flush  3 mL Intravenous Q12H  . spironolactone  25 mg Oral Daily  . traZODone  50 mg Oral QHS   Continuous Infusions:  PRN Meds: sodium chloride, acetaminophen **OR** acetaminophen, hydrALAZINE, ipratropium-albuterol, labetalol, nitroGLYCERIN, ondansetron **OR** ondansetron (ZOFRAN) IV, oxyCODONE-acetaminophen, sodium chloride flush   Vital Signs    Vitals:   07/28/16 1115  07/28/16 1146 07/28/16 2013 07/29/16 0438  BP: (!) 159/80 138/84 139/79 135/84  Pulse: 75 72 72 78  Resp: (!) 0 (!) 22 18 18   Temp:  97.4 F (36.3 C)  97.9 F (36.6 C)  TempSrc:  Oral  Oral  SpO2: 100% 95% 95% 99%  Weight:    238 lb 4.8 oz (108.1 kg)  Height:        Intake/Output Summary (Last 24 hours) at 07/29/16 0831 Last data filed at 07/29/16 0438  Gross per 24 hour  Intake              480 ml  Output              700 ml  Net             -220 ml   Filed Weights   07/27/16 0433 07/28/16 0418 07/29/16 0438  Weight: 245 lb 9.6 oz (111.4 kg) 242 lb 12.8 oz (110.1 kg) 238 lb 4.8 oz (108.1 kg)    Physical Exam    GEN: Well nourished, well developed, in no acute distress.  HEENT: Grossly normal.  Neck: Supple, no JVD, carotid bruits, or masses. Cardiac: RRR, no murmurs, rubs, or gallops. No clubbing or cyanosis. Trace pre-tibial edema bilaterally.  Radials/DP/PT 2+ and equal bilaterally.  Respiratory:  Respirations regular and unlabored, clear to auscultation bilaterally. GI: Soft, nontender, nondistended, BS + x 4. MS: no deformity or atrophy. Skin: warm and  dry, no rash. Neuro:  Strength and sensation are intact. Psych: AAOx3.  Normal affect.  Labs    CBC No results for input(s): WBC, NEUTROABS, HGB, HCT, MCV, PLT in the last 72 hours. Basic Metabolic Panel  Recent Labs  07/28/16 0448 07/29/16 0625  NA 138 137  K 4.1 4.1  CL 105 105  CO2 25 26  GLUCOSE 115* 109*  BUN 17 17  CREATININE 0.90 0.94  CALCIUM 8.9 9.2   Liver Function Tests No results for input(s): AST, ALT, ALKPHOS, BILITOT, PROT, ALBUMIN in the last 72 hours. No results for input(s): LIPASE, AMYLASE in the last 72 hours. Cardiac Enzymes No results for input(s): CKTOTAL, CKMB, CKMBINDEX, TROPONINI in the last 72 hours. BNP Invalid input(s): POCBNP D-Dimer No results for input(s): DDIMER in the last 72 hours. Hemoglobin A1C No results for input(s): HGBA1C in the last 72 hours. Fasting  Lipid Panel No results for input(s): CHOL, HDL, LDLCALC, TRIG, CHOLHDL, LDLDIRECT in the last 72 hours. Thyroid Function Tests No results for input(s): TSH, T4TOTAL, T3FREE, THYROIDAB in the last 72 hours.  Invalid input(s): FREET3  Telemetry    NSR, 70's bpm, occasional PVC - Personally Reviewed  ECG    n/a - Personally Reviewed  Radiology    No results found.  Cardiac Studies   [facility]     Prox RCA lesion, 0 %stenosed.  Mid RCA lesion, 10 %stenosed.  Mid Cx lesion, 0 %stenosed.  Mid Cx to Dist Cx lesion, 0 %stenosed.  Prox Cx lesion, 40 %stenosed.  Prox LAD lesion, 40 %stenosed.  Mid LAD lesion, 40 %stenosed.  Dist LAD lesion, 30 %stenosed.  LV end diastolic pressure is severely elevated.   1. Patent stents in the right coronary artery and left circumflex with no significant restenosis. Moderate LAD disease.  2. Right heart catheterization showed severely elevated filling pressures and severe pulmonary hypertension. RV pressure was 82/18, PDA 81/43 with a mean of 57. RA pressure was 11. Left ventricular end-diastolic pressure was 34 mmHg. Pulmonary capillary wedge pressure could not be obtained due to inability to advance the Swan-Ganz catheter. Cardiac output is 4.44 with a cardiac index of 1.94.  Recommendations: Continue medical therapy for coronary artery disease and systolic heart failure. The patient continues to be severely volume overloaded and will need another few days of IV diuresis.    Echo 07/24/2016: Study Conclusions  - Left ventricle: The cavity size was mildly dilated. There was   moderate concentric hypertrophy. Systolic function was severely   reduced. The estimated ejection fraction was in the range of 25%   to 30%. Diffuse hypokinesis. Features are consistent with a   pseudonormal left ventricular filling pattern, with concomitant   abnormal relaxation and increased filling pressure (grade 2   diastolic  dysfunction). - Mitral valve: There was mild regurgitation. - Left atrium: The atrium was moderately dilated. - Right atrium: The atrium was moderately dilated. - Pulmonary arteries: Systolic pressure could not be accurately   estimated.  Patient Profile     61 y.o. male with history of CAD s/p PCI/BMS to proximal and distal RCA as well as PCI/BMS to PDA on 01/28/2008 in the setting of unstable angina and abnormal stress echo with an EF > 55% at that time. Also with history of chronic diastolic CHF 2/2 poorly controlled blood pressure, COPD 2/2 tobacco abuse, hypertensive heart disease, DM, HLD, and obesity who [facility] with a > 1 month history of worsening SOB and  weight gain of > 20 pounds in 1 month.  Assessment & Plan    1. 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 Hypertensive heart disease: -BP improved -Continue current medications  3. CAD/elevated troponin: -LHC as above -Continue DAPT -May benefit from cardiac rehab  4. Frequent PVCs: -Possibly responsible for worsening EF -EP has recommended mexiletine; however given his compliance issues this may be best suited for outpatient discussion  5. HLD: -Statin   Signed, Eula Listen, PA-C [facility] Pager: [phone] 07/29/2016, 8:31 AM   Attending Note Patient seen and examined, agree with detailed note above,  Patient presentation and plan discussed on rounds.   1 L negative yesterday Not calculated for last 24 hours but does not appear to be significantly net negative He reports  improvement in abdominal distention, leg swelling Dramatic improvement in shortness of breath, still not at baseline Still on Lasix 60 mg IV twice a day  On clinical exam, JVD 15 cm to mandible Lungs clear to auscultation bilaterally, heart sounds regular with no murmurs appreciated, abdomen mildly distended, nontender, trace to 1+ pitting edema from mid shins to the ankles  Lab work reviewed, stable BMP, normal renal function and potassium  --- Acute on chronic combined CHF Would continue with current diuretic regiment Will likely need additional day or 2 high blood pressure this morning, may need to advance his medications We'll monitor blood pressure throughout the day. May improve with additional diuresis He is on BiDil, ACE inhibitor, blocker, Aldactone  --- PVCs Telemetry reviewed Having some PVCs, difficult to determine PVC burden Could consider outpatient Holter if this continues to be an issue,  greater than 20,000 PVCs could treat with antiarrhythmics  ---CAD  management as above Recent cardiac catheterization   Greater than 50% was spent in counseling and coordination of care with patient Total encounter time 25 minutes or more   Signed: Dossie Arbour  M.D., Ph.D. [facility]

## 2016-07-29 NOTE — Care Management (Signed)
Physicians anticipate discharge within next 24 - 48 hours.  Discussed with patient.  he says he has scales and he weighs himself every day.  Independent in all adls, denies issues accessing medical care, obtaining medications or with transportation.  [facility]

## 2016-07-29 NOTE — Progress Notes (Signed)
I have evaluated all of James HoitSydney Harrington' assessments and agree with the findings.

## 2016-07-29 NOTE — Progress Notes (Signed)
Initial HF Clinic appointment scheduled for August 05, 2016 at 9:00am. Thank you.

## 2016-07-29 NOTE — Discharge Instructions (Signed)
Heart Failure Clinic appointment on August 05, 2016 at 9:00am with James Kindredina Teliah Buffalo, FNP. Please call [phone] to reschedule.

## 2016-07-29 NOTE — Progress Notes (Signed)
[employer] - [place] at [facility]   PATIENT NAME: [name]    MR#:  [id number]  DATE OF BIRTH:  07-16-1955  SUBJECTIVE:  CHIEF COMPLAINT:   Chief Complaint  Patient presents with  . Chest Pain   - feeling better, diuresing well - will need another day of IV lasix  REVIEW OF SYSTEMS:  Review of Systems  Constitutional: Negative for chills and fever.  HENT: Negative for congestion, ear discharge, hearing loss and nosebleeds.   Eyes: Negative for blurred vision and double vision.  Respiratory: Positive for shortness of breath. Negative for cough and wheezing.   Cardiovascular: Positive for leg swelling. Negative for chest pain and palpitations.  Gastrointestinal: Negative for abdominal pain, constipation, diarrhea, nausea and vomiting.  Genitourinary: Positive for frequency. Negative for dysuria.  Neurological: Negative for dizziness, speech change, focal weakness, seizures and headaches.  Psychiatric/Behavioral: Negative for depression.    DRUG ALLERGIES:  No Known Allergies  VITALS:  Blood pressure 123/71, pulse 76, temperature 97.5 F (36.4 C), temperature source Oral, resp. rate 20, height 5\' 11"  (1.803 m), weight 108.1 kg (238 lb 4.8 oz), SpO2 100 %.  PHYSICAL EXAMINATION:  Physical Exam  GENERAL:  61 y.o.-year-old patient lying in the bed with no acute distress.  EYES: Pupils equal, round, reactive to light and accommodation. No scleral icterus. Extraocular muscles intact.  HEENT: Head atraumatic, normocephalic. Oropharynx and nasopharynx clear.  NECK:  Supple, no jugular venous distention. No thyroid enlargement, no tenderness.  LUNGS: Normal breath sounds bilaterally, no wheezing, rales,rhonchi or crepitation. No use of accessory muscles of respiration. Decreased bibasilar breath sounds CARDIOVASCULAR: S1, S2 normal. No murmurs, rubs, or gallops.  ABDOMEN: Soft, nontender, nondistended. Bowel sounds present. No organomegaly or mass.    EXTREMITIES: No  cyanosis, or clubbing.  2+ tense lower extremity edema, NEUROLOGIC: Cranial nerves II through XII are intact. Muscle strength 5/5 in all extremities. Sensation intact. Gait not checked.  PSYCHIATRIC: The patient is alert and oriented x 3.  SKIN: No obvious rash, lesion, or ulcer.    LABORATORY PANEL:   CBC  Recent Labs Lab 07/24/16 0816  WBC 5.8  HGB 11.4*  HCT 35.7*  PLT 150   ------------------------------------------------------------------------------------------------------------------  Chemistries   Recent Labs Lab 07/26/16 0547  07/29/16 0625  NA 136  < > 137  K 3.8  < > 4.1  CL 102  < > 105  CO2 26  < > 26  GLUCOSE 145*  < > 109*  BUN 16  < > 17  CREATININE 1.20  < > 0.94  CALCIUM 8.8*  < > 9.2  MG 1.9  --   --   < > = values in this interval not displayed. ------------------------------------------------------------------------------------------------------------------  Cardiac Enzymes  Recent Labs Lab 07/24/16 1505  TROPONINI 0.06*   ------------------------------------------------------------------------------------------------------------------  RADIOLOGY:  No results found.  EKG:   Orders placed or performed during the hospital encounter of 07/23/16  . ED EKG within 10 minutes  . ED EKG within 10 minutes    ASSESSMENT AND PLAN:   61 year old male with past medical history significant for CAD status post stents, CHF, COPD, diabetes and hypertension presents to hospital secondary to anasarca  #1 acute on chronic systolic CHF exacerbation-echocardiogram showing significantly reduced EF, 25% -Appreciate cardiology consult. Continue IV Lasix. Diuresed almost 9 L since admission. -Right and left Heart cardiac catheterization showing non obstructive CAD, patent stents but severely elevated right sided pressures - continue diuresis for may be another 1-2  days per cardiology -Continue cardiac medications including aspirin,  statin, Coreg, Plavix , lisinopril and Aldactone has been added - PVCs could be related to his cardiomyopathy - also Bidil added - might need ICD- outpt eval recommended  #2 CAD-status post prior stents. Continue cardiac medications. -Cardiac catheterization showing prior patent RCA and LCX stents, non obstructive CAD  #3 hypertension-continue medications as mentioned above  #4 diabetes mellitus-on sliding scale insulin. A1c 8.5 Added metformin for now  #5 DVT prophylaxis-on Lovenox   Possible discharge on Thursday   All the records are reviewed and case discussed with Care Management/Social Workerr. Management plans discussed with the patient, family and they are in agreement.  CODE STATUS: Full code  TOTAL TIME TAKING CARE OF THIS PATIENT: 32 minutes.   POSSIBLE D/C IN 2 DAYS, DEPENDING ON CLINICAL CONDITION.   Elynore Dolinski M.D on 07/29/2016 at 2:11 PM  Between 7am to 6pm - Pager - [phone]  After 6pm go to [email or web address] - Social research officer, governmentpassword EPAS [facility]  [facility] [place] [facility]  Office  [phone]  CC: Primary care physician; [facility]

## 2016-07-29 NOTE — Care Management Important Message (Signed)
Important Message  Patient Details  Name: James CluckSamuel Harrington MRN: [id number] Date of Birth: Oct 24, 1955   Medicare Important Message Given:  Yes  Initial signed IM printed from Epic and given to patient.    Eber HongGreene, Zia Kanner R, RN 07/29/2016, 10:22 AM

## 2016-07-30 LAB — GLUCOSE, CAPILLARY
GLUCOSE-CAPILLARY: 135 mg/dL — AB (ref 65–99)
Glucose-Capillary: 166 mg/dL — ABNORMAL HIGH (ref 65–99)
Glucose-Capillary: 183 mg/dL — ABNORMAL HIGH (ref 65–99)

## 2016-07-30 LAB — BASIC METABOLIC PANEL
Anion gap: 9 (ref 5–15)
BUN: 20 mg/dL (ref 6–20)
CALCIUM: 9.3 mg/dL (ref 8.9–10.3)
CHLORIDE: 105 mmol/L (ref 101–111)
CO2: 24 mmol/L (ref 22–32)
CREATININE: 1.03 mg/dL (ref 0.61–1.24)
GFR calc non Af Amer: >60 mL/min (ref >60)
Glucose, Bld: 111 mg/dL — ABNORMAL HIGH (ref 65–99)
Potassium: 4.2 mmol/L (ref 3.5–5.1)
SODIUM: 138 mmol/L (ref 135–145)

## 2016-07-30 LAB — MAGNESIUM: MAGNESIUM: 2.3 mg/dL (ref 1.7–2.4)

## 2016-07-30 MED ORDER — CARVEDILOL 25 MG PO TABS
25.0000 mg | ORAL_TABLET | Freq: Two times a day (BID) | ORAL | Status: DC
  Administered 2016-07-30 – 2016-07-31 (×3): 25 mg via ORAL
  Filled 2016-07-30 (×3): qty 1

## 2016-07-30 NOTE — Progress Notes (Addendum)
Patient Name: [name] Date of Encounter: 07/30/2016  Primary Cardiologist: [facility] Problem List     Principal Problem:   Acute respiratory distress Active Problems:   Acute on chronic systolic CHF (congestive heart failure), NYHA class 3 (HCC)   Hypertensive heart disease   Tobacco abuse   Accelerated hypertension   COPD (chronic obstructive pulmonary disease) (HCC)   CAD (coronary artery disease)   HLD (hyperlipidemia)   Ischemic cardiomyopathy   Non-sustained ventricular tachycardia (HCC)     Subjective   Breathing continues to improve. Labs pending this morning. Documented UOP of 500 mL past 24 hours. Patient reports good UOP. Weight remains 28 pounds down from admission. Ambulated without SOB on 1/30.   Inpatient Medications    Scheduled Meds: . amLODipine  10 mg Oral Daily  . aspirin  81 mg Oral Daily  . atorvastatin  80 mg Oral Daily  . carvedilol  25 mg Oral BID WC  . clopidogrel  75 mg Oral Daily  . guaiFENesin  600 mg Oral BID   And  . dextromethorphan  30 mg Oral BID  . enoxaparin (LOVENOX) injection  40 mg Subcutaneous Q24H  . furosemide  60 mg Intravenous BID  . insulin aspart  0-5 Units Subcutaneous QHS  . insulin aspart  0-9 Units Subcutaneous TID WC  . isosorbide-hydrALAZINE  1 tablet Oral TID  . lisinopril  40 mg Oral Daily  . magnesium oxide  400 mg Oral Daily  . metFORMIN  500 mg Oral BID WC  . mometasone-formoterol  2 puff Inhalation BID  . potassium chloride  30 mEq Oral BID  . sodium chloride flush  3 mL Intravenous Q12H  . sodium chloride flush  3 mL Intravenous Q12H  . spironolactone  25 mg Oral Daily  . traZODone  50 mg Oral QHS   Continuous Infusions:  PRN Meds: sodium chloride, acetaminophen **OR** acetaminophen, hydrALAZINE, ipratropium-albuterol, labetalol, nitroGLYCERIN, ondansetron **OR** ondansetron (ZOFRAN) IV, oxyCODONE-acetaminophen, sodium chloride flush   Vital Signs    Vitals:   07/29/16 1202  07/29/16 2111 07/30/16 0455 07/30/16 0500  BP: 123/71 137/78 (!) 153/94   Pulse: 76 80 79   Resp:  18 18   Temp:  98 F (36.7 C) 97.8 F (36.6 C)   TempSrc:  Oral Oral   SpO2: 100% 95% 96%   Weight:    238 lb (108 kg)  Height:        Intake/Output Summary (Last 24 hours) at 07/30/16 0736 Last data filed at 07/30/16 0027  Gross per 24 hour  Intake              720 ml  Output              500 ml  Net              220 ml   Filed Weights   07/28/16 0418 07/29/16 0438 07/30/16 0500  Weight: 242 lb 12.8 oz (110.1 kg) 238 lb 4.8 oz (108.1 kg) 238 lb (108 kg)    Physical Exam    GEN: Well nourished, well developed, in no acute distress.  HEENT: Grossly normal.  Neck: Supple, JVD elevated ~ 8 cm, no carotid bruits, or masses. Cardiac: RRR, no murmurs, rubs, or gallops. No clubbing or cyanosis. Trace pre-tibial edema.  Radials/DP/PT 2+ and equal bilaterally.  Respiratory:  Respirations regular and unlabored, clear to auscultation bilaterally. GI: Soft, nontender, nondistended, BS + x 4. MS: no deformity  or atrophy. Skin: warm and dry, no rash. Neuro:  Strength and sensation are intact. Psych: AAOx3.  Normal affect.  Labs    CBC No results for input(s): WBC, NEUTROABS, HGB, HCT, MCV, PLT in the last 72 hours. Basic Metabolic Panel  Recent Labs  07/28/16 0448 07/29/16 0625  NA 138 137  K 4.1 4.1  CL 105 105  CO2 25 26  GLUCOSE 115* 109*  BUN 17 17  CREATININE 0.90 0.94  CALCIUM 8.9 9.2   Liver Function Tests No results for input(s): AST, ALT, ALKPHOS, BILITOT, PROT, ALBUMIN in the last 72 hours. No results for input(s): LIPASE, AMYLASE in the last 72 hours. Cardiac Enzymes No results for input(s): CKTOTAL, CKMB, CKMBINDEX, TROPONINI in the last 72 hours. BNP Invalid input(s): POCBNP D-Dimer No results for input(s): DDIMER in the last 72 hours. Hemoglobin A1C No results for input(s): HGBA1C in the last 72 hours. Fasting Lipid Panel No results for input(s):  CHOL, HDL, LDLCALC, TRIG, CHOLHDL, LDLDIRECT in the last 72 hours. Thyroid Function Tests No results for input(s): TSH, T4TOTAL, T3FREE, THYROIDAB in the last 72 hours.  Invalid input(s): FREET3  Telemetry    NSR, 70's to 80's frequent PVCs occasionally in a pattern of ventricular bigeminy - Personally Reviewed  ECG    n/a - Personally Reviewed  Radiology    No results found.  Cardiac Studies   [facility] 07/28/2016: Conclusion     Prox RCA lesion, 0 %stenosed.  Mid RCA lesion, 10 %stenosed.  Mid Cx lesion, 0 %stenosed.  Mid Cx to Dist Cx lesion, 0 %stenosed.  Prox Cx lesion, 40 %stenosed.  Prox LAD lesion, 40 %stenosed.  Mid LAD lesion, 40 %stenosed.  Dist LAD lesion, 30 %stenosed.  LV end diastolic pressure is severely elevated.  1. Patent stents in the right coronary artery and left circumflex with no significant restenosis. Moderate LAD disease.  2. Right heart catheterization showed severely elevated filling pressures and severe pulmonary hypertension. RV pressure was 82/18, PDA 81/43 with a mean of 57. RA pressure was 11. Left ventricular end-diastolic pressure was 34 mmHg. Pulmonary capillary wedge pressure could not be obtained due to inability to advance the Swan-Ganz catheter. Cardiac output is 4.44 with a cardiac index of 1.94.  Recommendations: Continue medical therapy for coronary artery disease and systolic heart failure. The patient continues to be severely volume overloaded and will need another few days of IV diuresis.    Echo 07/24/2016: Study Conclusions  - Left ventricle: The cavity size was mildly dilated. There was moderate concentric hypertrophy. Systolic function was severely reduced. The estimated ejection fraction was in the range of 25% to 30%. Diffuse hypokinesis. Features are consistent with a pseudonormal left ventricular filling pattern, with concomitant abnormal relaxation and increased filling pressure (grade  2 diastolic dysfunction). - Mitral valve: There was mild regurgitation. - Left atrium: The atrium was moderately dilated. - Right atrium: The atrium was moderately dilated. - Pulmonary arteries: Systolic pressure could not be accurately estimated.   Patient Profile     61 y.o. male with history of CAD s/p PCI/BMS to proximal and distal RCA as well as PCI/BMS to PDA on 01/28/2008 in the setting of unstable angina and abnormal stress echo with an EF > 55% at that time. Also with history of chronic diastolic CHF 2/2 poorly controlled blood pressure, COPD 2/2 tobacco abuse, hypertensive heart disease, DM, HLD, and obesity who presented to [facility] on 1/24 with a > 1 month history of worsening SOB and weight  gain of > 20 pounds in 1 month.  Assessment & Plan    1. 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 Hypertensive heart disease: -BP improved, though still slightly elevated this morning -Increase Coreg to 25 mg bid -Continue current medications  3. CAD/elevated troponin: -LHC as above -Continue DAPT -May benefit from cardiac rehab  4. Frequent PVCs: -Possibly responsible for worsening EF -EP has recommended mexiletine; however given his compliance issues this may be best suited for outpatient discussion -Increase Coreg as above -Consider 24 hour Holter monitor as an outpatient to quantify   5. HLD: -Statin   Signed, Eula ListenRyan Dunn, PA-C [facility] Pager: [phone] 07/30/2016, 7:36 AM   .Attending  Note Patient seen and examined, agree with detailed note above,  Patient presentation and plan discussed on rounds.   Patient reports continued significant lower extremity edema though improvement of abdominal swelling, shortness of breath symptoms  1 L negative in the past 24 hours   Eating well Telemetry reviewed showing frequent PVCs  On physical exam still with JVD difficult to assess as he is sitting in a chair, lungs clear to auscultation bilaterally, heart sounds regular with no murmurs appreciated, 1-2+ pitting edema of his lower extremities to below the knees  Lab work reviewed showing stable renal function slight climbing BUN 16 up to 20 over the past several days Stable potassium  --acute on chronic systolic CHF Dramatic improvement over his hospital course though still with significant pitting lower extremity edema Slight changes in renal function though would continue aggressive diuresis Education provided concerning diet, fluid intake, salt intake, monitoring weight, titrating diuretics We'll reevaluate tomorrow for possible discharge  Greater than 50% was spent in counseling and coordination of care with patient Total encounter time 25 minutes or more   Signed: Dossie Arbourim Carrianne Hyun  M.D., Ph.D. [facility]

## 2016-07-30 NOTE — Progress Notes (Signed)
Patient continues to have numerous multi form PVCs.  Carvedilol dose increased this morning.

## 2016-07-30 NOTE — Plan of Care (Signed)
Problem: Pain Managment: Goal: General experience of comfort will improve Outcome: Progressing Has not requested pain medication for the last 36 hours.

## 2016-07-30 NOTE — Progress Notes (Signed)
CBG at noon was 166. It doesn't appear in the results.

## 2016-07-30 NOTE — Progress Notes (Signed)
Infrequent PVCs at this time.

## 2016-07-30 NOTE — Progress Notes (Signed)
[employer] [facility] [place] [facility]   PATIENT NAME: [name]    MR#:  [id number]  DATE OF BIRTH:  [birth year]  SUBJECTIVE:  CHIEF COMPLAINT:   Chief Complaint  Patient presents with  . Chest Pain   - feeling better, diuresing well, breathing improved. - ambulating in hallways well.  REVIEW OF SYSTEMS:  Review of Systems  Constitutional: Negative for chills and fever.  HENT: Negative for congestion, ear discharge, hearing loss and nosebleeds.   Eyes: Negative for blurred vision and double vision.  Respiratory: Negative for cough, shortness of breath and wheezing.   Cardiovascular: Positive for leg swelling. Negative for chest pain and palpitations.  Gastrointestinal: Negative for abdominal pain, constipation, diarrhea, nausea and vomiting.  Genitourinary: Positive for frequency. Negative for dysuria.  Neurological: Negative for dizziness, speech change, focal weakness, seizures and headaches.  Psychiatric/Behavioral: Negative for depression.    DRUG ALLERGIES:  No Known Allergies  VITALS:  Blood pressure (!) 139/93, pulse 86, temperature 97.9 F (36.6 C), temperature source Oral, resp. rate 18, height 5\' 11"  (1.803 m), weight 108 kg (238 lb), SpO2 94 %.  PHYSICAL EXAMINATION:  Physical Exam  GENERAL:  61 y.o.-year-old patient lying in the bed with no acute distress.  EYES: Pupils equal, round, reactive to light and accommodation. No scleral icterus. Extraocular muscles intact.  HEENT: Head atraumatic, normocephalic. Oropharynx and nasopharynx clear.  NECK:  Supple, no jugular venous distention. No thyroid enlargement, no tenderness.  LUNGS: Normal breath sounds bilaterally, no wheezing, rales,rhonchi or crepitation. No use of accessory muscles of respiration. Decreased bibasilar breath sounds CARDIOVASCULAR: S1, S2 normal. No murmurs, rubs, or gallops.  ABDOMEN: Soft, nontender, nondistended. Bowel sounds present. No organomegaly or mass.    EXTREMITIES: No  cyanosis, or clubbing.  2+ lower extremity edema- much better NEUROLOGIC: Cranial nerves II through XII are intact. Muscle strength 5/5 in all extremities. Sensation intact. Gait not checked.  PSYCHIATRIC: The patient is alert and oriented x 3.  SKIN: No obvious rash, lesion, or ulcer.    LABORATORY PANEL:   CBC  Recent Labs Lab 07/24/16 0816  WBC 5.8  HGB 11.4*  HCT 35.7*  PLT 150   ------------------------------------------------------------------------------------------------------------------  Chemistries   Recent Labs Lab 07/30/16 0735  NA 138  K 4.2  CL 105  CO2 24  GLUCOSE 111*  BUN 20  CREATININE 1.03  CALCIUM 9.3  MG 2.3   ------------------------------------------------------------------------------------------------------------------  Cardiac Enzymes  Recent Labs Lab 07/24/16 1505  TROPONINI 0.06*   ------------------------------------------------------------------------------------------------------------------  RADIOLOGY:  No results found.  EKG:   Orders placed or performed during the hospital encounter of 07/23/16  . ED EKG within 10 minutes  . ED EKG within 10 minutes    ASSESSMENT AND PLAN:   61 year old male with past medical history significant for CAD status post stents, CHF, COPD, diabetes and hypertension presents to hospital secondary to anasarca  #1 acute on chronic systolic CHF exacerbation-echocardiogram showing significantly reduced EF, 25% -Appreciate cardiology consult. Continue IV Lasix. Diuresed almost 9 L since admission. -Right and left Heart cardiac catheterization showing non obstructive CAD, patent stents but severely elevated right sided pressures - continue IV diuresis for 1 more day per cardiology and change to oral lasix -Continue cardiac medications including aspirin, statin, Coreg, Plavix , lisinopril and Aldactone has been added - PVCs could be related to his cardiomyopathy - also Bidil  added - might need ICD- outpt eval recommended  #2 CAD-status post prior stents. Continue cardiac medications. -Cardiac  catheterization showing prior patent RCA and LCX stents, non obstructive CAD  #3 hypertension-continue medications as mentioned above  #4 diabetes mellitus-on sliding scale insulin. A1c 8.5 Added metformin for now  #5 DVT prophylaxis-on Lovenox   Possible discharge tomorrow   All the records are reviewed and case discussed with Care Management/Social Workerr. Management plans discussed with the patient, family and they are in agreement.  CODE STATUS: Full code  TOTAL TIME TAKING CARE OF THIS PATIENT: 34 minutes.   POSSIBLE D/C TOMORROW, DEPENDING ON CLINICAL CONDITION.   Enid BaasKALISETTI,Klint Lezcano M.D on 07/30/2016 at 3:29 PM  Between 7am to 6pm - Pager - [phone]  After 6pm go to [email or web address] - Social research officer, governmentpassword EPAS [facility]  [facility]  Office  [phone]  CC: Primary care physician; [facility]

## 2016-07-30 NOTE — Plan of Care (Signed)
Problem: Cardiac: Goal: Ability to achieve and maintain adequate cardiopulmonary perfusion will improve Outcome: Progressing Edema of lower extremities has resolved.  BP controlled with medication.  Numerous PVC, treating with medication.  Patient has no symptoms

## 2016-07-31 ENCOUNTER — Telehealth: Payer: Self-pay | Admitting: *Deleted

## 2016-07-31 LAB — BASIC METABOLIC PANEL WITH GFR
Anion gap: 6 (ref 5–15)
BUN: 26 mg/dL — ABNORMAL HIGH (ref 6–20)
CO2: 24 mmol/L (ref 22–32)
Calcium: 9.2 mg/dL (ref 8.9–10.3)
Chloride: 106 mmol/L (ref 101–111)
Creatinine, Ser: 1.18 mg/dL (ref 0.61–1.24)
GFR calc Af Amer: >60 mL/min
GFR calc non Af Amer: >60 mL/min
Glucose, Bld: 162 mg/dL — ABNORMAL HIGH (ref 65–99)
Potassium: 4.3 mmol/L (ref 3.5–5.1)
Sodium: 136 mmol/L (ref 135–145)

## 2016-07-31 LAB — GLUCOSE, CAPILLARY
Glucose-Capillary: 166 mg/dL — ABNORMAL HIGH (ref 65–99)
Glucose-Capillary: 130 mg/dL — ABNORMAL HIGH (ref 65–99)

## 2016-07-31 LAB — MAGNESIUM: Magnesium: 2.3 mg/dL (ref 1.7–2.4)

## 2016-07-31 MED ORDER — SPIRONOLACTONE 25 MG PO TABS: 25.0000 mg | ORAL_TABLET | Freq: Every day | ORAL | 0 refills | Status: DC

## 2016-07-31 MED ORDER — ISOSORB DINITRATE-HYDRALAZINE 20-37.5 MG PO TABS: 1.0000 | ORAL_TABLET | Freq: Three times a day (TID) | ORAL | 0 refills | Status: DC

## 2016-07-31 MED ORDER — FUROSEMIDE 40 MG PO TABS: 40.0000 mg | ORAL_TABLET | Freq: Two times a day (BID) | ORAL | 1 refills | Status: DC

## 2016-07-31 MED ORDER — CARVEDILOL 25 MG PO TABS: 25.0000 mg | ORAL_TABLET | Freq: Two times a day (BID) | ORAL | 0 refills | Status: DC

## 2016-07-31 MED ORDER — FUROSEMIDE 40 MG PO TABS
40.0000 mg | ORAL_TABLET | Freq: Two times a day (BID) | ORAL | Status: DC
Start: 2016-07-31 — End: 2016-07-31

## 2016-07-31 NOTE — Care Management (Signed)
[facility]

## 2016-07-31 NOTE — Progress Notes (Signed)
Discharge instructions explained to pt/ verbalized an understanding/ iv and tele removed/ RX given to pt/ home meds returned to pt/ ambulated off unit with RN

## 2016-07-31 NOTE — Telephone Encounter (Signed)
-----   Message from Coralee RudSabrina F Gilley sent at 07/31/2016 10:57 AM EST ----- Regarding: tcm/ph 2/8 2:00  Eula Listenyan Dunn, [place]

## 2016-07-31 NOTE — Progress Notes (Addendum)
Progress Note  Patient Name: Jonell CluckSamuel Thornberry Date of Encounter: 07/31/2016   [facility]  Subjective   Reports that he feels well, minimal leg edema, dramatic improvement in his breathing Negative close to 10 L for his hospital course Feels he is ready to go home   Vital Signs    Vitals:   07/30/16 1953 07/31/16 0434 07/31/16 0441 07/31/16 0745  BP: (!) 102/58 132/70  (!) 131/95  Pulse: 76 (!) 137 71 71  Resp: 16 18  20   Temp: 97.8 F (36.6 C) 98.6 F (37 C)    TempSrc: Oral Oral    SpO2: 98% 100%  100%  Weight:  234 lb 6.4 oz (106.3 kg)    Height:        Intake/Output Summary (Last 24 hours) at 07/31/16 2113 Last data filed at 07/31/16 1019  Gross per 24 hour  Intake              240 ml  Output                0 ml  Net              240 ml   Filed Weights   07/29/16 0438 07/30/16 0500 07/31/16 0434  Weight: 238 lb 4.8 oz (108.1 kg) 238 lb (108 kg) 234 lb 6.4 oz (106.3 kg)    Telemetry    Maintaining normal sinus rhythm   Physical Exam   GEN: No acute distress.  Neck: No JVD  Cardiac: RRR, no murmurs, rubs, or gallops. , Trace pitting lower extremity edema to the mid shins Radials/DP/PT 2+ and equal bilaterally.  Respiratory:  Clear to auscultation bilaterally. GI: Soft, nontender, non-distended  MS: no deformity;  Neuro:  Alert and oriented x 3  Labs    Chemistry Recent Labs Lab 07/29/16 0625 07/30/16 0735 07/31/16 0523  NA 137 138 136  K 4.1 4.2 4.3  CL 105 105 106  CO2 26 24 24   GLUCOSE 109* 111* 162*  BUN 17 20 26*  CREATININE 0.94 1.03 1.18  CALCIUM 9.2 9.3 9.2  GFRNONAA >60 >60 >60  GFRAA >60 >60 >60  ANIONGAP 6 9 6      HematologyNo results for input(s): WBC, RBC, HGB, HCT, MCV, MCH, MCHC, RDW, PLT in the last 168 hours.  Cardiac EnzymesNo results for input(s): TROPONINI in the last 168 hours. No results for input(s): TROPIPOC in the last 168 hours.   BNPNo results for input(s): BNP, PROBNP in  the last 168 hours.   DDimer No results for input(s): DDIMER in the last 168 hours.   Radiology    No results found.    Patient Profile     61 y.o. male   Assessment & Plan      Case discussed with Dr. Allena KatzPatel Patient has poor diet, CHF fluid/salt noncompliance   1. Acute on chronic combined CHF: Would recommend we obtain a dry weight for our records Would discharge on Lasix 40 mgrams twice a day Has not required potassium I spent significant time with him yesterday and today doing CHF education, discussed titration of diuretics based on his weight, techniques to limit salt and fluid intake Follow up in clinic with Dr. Kirke CorinArida  2. Hypertensive heart disease: Would discharge on current regimen, blood pressure stable  3. CAD/elevated troponin: -Continue DAPT -May benefit from cardiac rehab  4. Frequent PVCs: We'll need to closely monitor as an outpatient, consider 48-hour Holter monitor for monitoring  of PVC burden  5. HLD: -Statin   Total encounter time more than 35 minutes  Greater than 50% was spent in counseling and coordination of care with the patient   Signed, Dossie Arbour, MD, Ph.D [facility]   Signed, Julien Nordmann, MD  07/31/2016, 9:13 PM

## 2016-07-31 NOTE — Telephone Encounter (Signed)
[facility]  Patient understands to follow up with provider Eula Listenyan Dunn PA on 08/07/16 at 2:00PM [facility]. Patient understands discharge instructions? Yes Patient understands medications and regiment? Yes Patient understands to bring all medications to this visit? Yes  Patient verbalized understanding and had no further questions at this time. Let him know to call back if needed.

## 2016-07-31 NOTE — Discharge Summary (Signed)
[facility] - [place] at [facility]   PATIENT NAME: [name]    MR#:  [id number]  DATE OF BIRTH:  02/28/1956  DATE OF ADMISSION:  07/23/2016 ADMITTING PHYSICIAN: Oralia Manis, MD  DATE OF DISCHARGE: 07/31/2016  PRIMARY CARE PHYSICIAN: [facility]    ADMISSION DIAGNOSIS:  NSTEMI (non-ST elevated myocardial infarction) (HCC) [I21.4] Acute on chronic congestive heart failure, unspecified congestive heart failure type (HCC) [I50.9]  DISCHARGE DIAGNOSIS:   Acute on chronic CHF, systolic Severe cardiomyopathy EF 25% HTN SECONDARY DIAGNOSIS:   Past Medical History:  Diagnosis Date  . CAD in native artery    a. stress echo 12/2007 abnl, EF > 55%, b. LHC 01/28/08: mLAD 30, D1 40, dLCx 70, pRCA 30, mRCA 70, mRCA lesion 2 80, PDA 90, s/p PCI/BMS to prox and distal RCA, s/p PCI/BMS to PDA; c. patient reports PCI/stenting x 2 in early 2017 at the [facility] (no records on file)  . Chronic diastolic CHF (congestive heart failure) (HCC)    a. echo 2009: > 55%  . COPD (chronic obstructive pulmonary disease) (HCC)   . Gout   . Hyperlipidemia   . Hypertensive heart disease   . Obesity   . Tobacco abuse     HOSPITAL COURSE:   61 year old male with past medical history significant for CAD status post stents, CHF, COPD, diabetes and hypertension presents to hospital secondary to anasarca  #1 acute on chronic systolic CHF exacerbation-echocardiogram showing significantly reduced EF, 25% -Appreciate cardiology consult. Pt was on  IV Lasix. Diuresed almost 9 L since admission. Weight down 30 lbs -Right and left Heart cardiac catheterization showing non obstructive CAD, patent stents but severely elevated right sided pressures - change to oral lasix 40 mg bid -Continue cardiac medications including aspirin, statin, Coreg, Plavix , lisinopril and Aldactone has been added - PVCs could be related to his cardiomyopathy - also Bidil added - might need ICD- outpt  eval recommended  #2 CAD-status post prior stents. Continue cardiac medications. -Cardiac catheterization showing prior patent RCA and LCX stents, non obstructive CAD  #3 hypertension-continue medications as mentioned above  #4 diabetes mellitus-on sliding scale insulin. A1c 8.5 Added metformin for now  #5 DVT prophylaxis-on Lovenox  Overall stable for d/c sats >92% on RA D/c home D/w dr Mariah Milling and agrees with plan  CONSULTS OBTAINED:  Treatment Team:  Antonieta Iba, MD  DRUG ALLERGIES:  No Known Allergies  DISCHARGE MEDICATIONS:   Current Discharge Medication List    START taking these medications   Details  carvedilol (COREG) 25 MG tablet Take 1 tablet (25 mg total) by mouth 2 (two) times daily with a meal. Qty: 60 tablet, Refills: 0    furosemide (LASIX) 40 MG tablet Take 1 tablet (40 mg total) by mouth 2 (two) times daily. Qty: 60 tablet, Refills: 1    isosorbide-hydrALAZINE (BIDIL) 20-37.5 MG tablet Take 1 tablet by mouth 3 (three) times daily. Qty: 30 tablet, Refills: 0    spironolactone (ALDACTONE) 25 MG tablet Take 1 tablet (25 mg total) by mouth daily. Qty: 30 tablet, Refills: 0      CONTINUE these medications which have NOT CHANGED   Details  albuterol-ipratropium (COMBIVENT) 18-103 MCG/ACT inhaler Inhale 2 puffs into the lungs every 4 (four) hours as needed for wheezing.    allopurinol (ZYLOPRIM) 300 MG tablet Take 300 mg by mouth daily.    amLODipine (NORVASC) 10 MG tablet Take 10 mg by mouth daily.    aspirin  81 MG tablet Take 81 mg by mouth daily.    atorvastatin (LIPITOR) 40 MG tablet Take 20 mg by mouth daily.    budesonide-formoterol (SYMBICORT) 160-4.5 MCG/ACT inhaler Inhale 2 puffs into the lungs 2 (two) times daily.    docusate sodium (COLACE) 100 MG capsule Take 100 mg by mouth 2 (two) times daily.    Fluticasone-Salmeterol (ADVAIR) 250-50 MCG/DOSE AEPB Inhale 1 puff into the lungs every 12 (twelve) hours.    Indomethacin  (INDOCIN PO) Take 60 mg by mouth daily.    lisinopril (PRINIVIL,ZESTRIL) 40 MG tablet Take 40 mg by mouth daily.    metFORMIN (GLUCOPHAGE) 500 MG tablet Take 500 mg by mouth daily with breakfast.    nitroGLYCERIN (NITROSTAT) 0.4 MG SL tablet Place 0.4 mg under the tongue every 5 (five) minutes as needed for chest pain.    traZODone (DESYREL) 50 MG tablet Take 50 mg by mouth at bedtime.    clopidogrel (PLAVIX) 75 MG tablet Take 75 mg by mouth daily.    nicotine (NICODERM CQ - DOSED IN MG/24 HR) 7 mg/24hr patch Place 1 patch onto the skin daily.    ondansetron (ZOFRAN) 4 MG tablet Take 4 mg by mouth every 4 (four) hours as needed for nausea.    oxyCODONE-acetaminophen (PERCOCET/ROXICET) 5-325 MG per tablet Take 1 tablet by mouth every 6 (six) hours as needed for pain.    PREDNISONE, PAK, PO Take 60 mg by mouth. Taper x 10 mg until stopped      STOP taking these medications     METOPROLOL SUCCINATE ER PO      cloNIDine (CATAPRES) 0.1 MG tablet      hydrochlorothiazide (HYDRODIURIL) 12.5 MG tablet         If you experience worsening of your admission symptoms, develop shortness of breath, life threatening emergency, suicidal or homicidal thoughts you must seek medical attention immediately by calling 911 or calling your MD immediately  if symptoms less severe.  You Must read complete instructions/literature along with all the possible adverse reactions/side effects for all the Medicines you take and that have been prescribed to you. Take any new Medicines after you have completely understood and accept all the possible adverse reactions/side effects.   Please note  You were cared for by a hospitalist during your hospital stay. If you have any questions about your discharge medications or the care you received while you were in the hospital after you are discharged, you can call the unit and asked to speak with the hospitalist on call if the hospitalist that took care of you is not  available. Once you are discharged, your primary care physician will handle any further medical issues. Please note that NO REFILLS for any discharge medications will be authorized once you are discharged, as it is imperative that you return to your primary care physician (or establish a relationship with a primary care physician if you do not have one) for your aftercare needs so that they can reassess your need for medications and monitor your lab values. Today   SUBJECTIVE    Doing well VITAL SIGNS:  Blood pressure (!) 131/95, pulse 71, temperature 98.6 F (37 C), temperature source Oral, resp. rate 20, height 5\' 11"  (1.803 m), weight 106.3 kg (234 lb 6.4 oz), SpO2 100 %.  I/O:   Intake/Output Summary (Last 24 hours) at 07/31/16 1024 Last data filed at 07/31/16 1019  Gross per 24 hour  Intake  360 ml  Output             1750 ml  Net            -1390 ml    PHYSICAL EXAMINATION:  GENERAL:  61 y.o.-year-old patient lying in the bed with no acute distress.  EYES: Pupils equal, round, reactive to light and accommodation. No scleral icterus. Extraocular muscles intact.  HEENT: Head atraumatic, normocephalic. Oropharynx and nasopharynx clear.  NECK:  Supple, no jugular venous distention. No thyroid enlargement, no tenderness.  LUNGS: Normal breath sounds bilaterally, no wheezing, rales,rhonchi or crepitation. No use of accessory muscles of respiration.  CARDIOVASCULAR: S1, S2 normal. No murmurs, rubs, or gallops.  ABDOMEN: Soft, non-tender, non-distended. Bowel sounds present. No organomegaly or mass.  EXTREMITIES: No pedal edema, cyanosis, or clubbing.  NEUROLOGIC: Cranial nerves II through XII are intact. Muscle strength 5/5 in all extremities. Sensation intact. Gait not checked.  PSYCHIATRIC: The patient is alert and oriented x 3.  SKIN: No obvious rash, lesion, or ulcer.   DATA REVIEW:   CBC  No results for input(s): WBC, HGB, HCT, PLT in the last 168  hours.  Chemistries   Recent Labs Lab 07/31/16 0523  NA 136  K 4.3  CL 106  CO2 24  GLUCOSE 162*  BUN 26*  CREATININE 1.18  CALCIUM 9.2  MG 2.3    Microbiology Results   No results found for this or any previous visit (from the past 240 hour(s)).  RADIOLOGY:  No results found.   Management plans discussed with the patient, family and they are in agreement.  CODE STATUS:     Code Status Orders        Start     Ordered   07/24/16 0235  Full code  Continuous     07/24/16 0235    Code Status History    Date Active Date Inactive Code Status Order ID Comments User Context   This patient has a current code status but no historical code status.    Advance Directive Documentation   Flowsheet Row Most Recent Value  Type of Advance Directive  Healthcare Power of Attorney, Living will  Pre-existing out of facility DNR order (yellow form or pink MOST form)  No data  "MOST" Form in Place?  No data      TOTAL TIME TAKING CARE OF THIS PATIENT: 40 minutes.    Saoirse Legere M.D on 07/31/2016 at 10:24 AM  Between 7am to 6pm - Pager - [phone] After 6pm go to [email or web address] - Social research officer, [email or web address] [email or web address]  [facility]  Office  [phone]  CC: Primary care physician; [facility]

## 2016-08-05 ENCOUNTER — Ambulatory Visit: Payer: Medicare Other | Admitting: Family

## 2016-08-05 ENCOUNTER — Telehealth: Payer: Self-pay | Admitting: Family

## 2016-08-05 NOTE — Telephone Encounter (Signed)
Patient missed his initial appointment at the [facility] on 08/05/16. Will attempt to reschedule.

## 2016-08-07 ENCOUNTER — Encounter: Payer: Medicare Other | Admitting: Physician Assistant

## 2017-01-05 ENCOUNTER — Emergency Department: Payer: Medicare Other

## 2017-01-05 ENCOUNTER — Inpatient Hospital Stay
Admission: EM | Admit: 2017-01-05 | Discharge: 2017-01-07 | DRG: 293 | Disposition: A | Discharge status: Home Health | Payer: Medicare Other | Attending: Internal Medicine | Admitting: Internal Medicine

## 2017-01-05 ENCOUNTER — Encounter: Payer: Self-pay | Admitting: Emergency Medicine

## 2017-01-05 DIAGNOSIS — I214 Non-ST elevation (NSTEMI) myocardial infarction: Secondary | ICD-10-CM | POA: Diagnosis not present

## 2017-01-05 DIAGNOSIS — Z8249 Family history of ischemic heart disease and other diseases of the circulatory system: Secondary | ICD-10-CM

## 2017-01-05 DIAGNOSIS — I2510 Atherosclerotic heart disease of native coronary artery without angina pectoris: Secondary | ICD-10-CM | POA: Diagnosis present

## 2017-01-05 DIAGNOSIS — I5043 Acute on chronic combined systolic (congestive) and diastolic (congestive) heart failure: Secondary | ICD-10-CM | POA: Diagnosis present

## 2017-01-05 DIAGNOSIS — I509 Heart failure, unspecified: Secondary | ICD-10-CM

## 2017-01-05 DIAGNOSIS — R079 Chest pain, unspecified: Secondary | ICD-10-CM

## 2017-01-05 DIAGNOSIS — F17210 Nicotine dependence, cigarettes, uncomplicated: Secondary | ICD-10-CM | POA: Diagnosis present

## 2017-01-05 DIAGNOSIS — Z9119 Patient's noncompliance with other medical treatment and regimen: Secondary | ICD-10-CM

## 2017-01-05 DIAGNOSIS — I5033 Acute on chronic diastolic (congestive) heart failure: Secondary | ICD-10-CM | POA: Diagnosis present

## 2017-01-05 DIAGNOSIS — E119 Type 2 diabetes mellitus without complications: Secondary | ICD-10-CM | POA: Diagnosis present

## 2017-01-05 DIAGNOSIS — J449 Chronic obstructive pulmonary disease, unspecified: Secondary | ICD-10-CM | POA: Diagnosis present

## 2017-01-05 DIAGNOSIS — Z7984 Long term (current) use of oral hypoglycemic drugs: Secondary | ICD-10-CM

## 2017-01-05 DIAGNOSIS — I255 Ischemic cardiomyopathy: Secondary | ICD-10-CM | POA: Diagnosis present

## 2017-01-05 DIAGNOSIS — Z7982 Long term (current) use of aspirin: Secondary | ICD-10-CM

## 2017-01-05 DIAGNOSIS — I110 Hypertensive heart disease with heart failure: Secondary | ICD-10-CM | POA: Diagnosis not present

## 2017-01-05 DIAGNOSIS — R55 Syncope and collapse: Secondary | ICD-10-CM

## 2017-01-05 DIAGNOSIS — R0603 Acute respiratory distress: Secondary | ICD-10-CM | POA: Diagnosis present

## 2017-01-05 DIAGNOSIS — I160 Hypertensive urgency: Secondary | ICD-10-CM

## 2017-01-05 DIAGNOSIS — Z955 Presence of coronary angioplasty implant and graft: Secondary | ICD-10-CM

## 2017-01-05 DIAGNOSIS — Z79899 Other long term (current) drug therapy: Secondary | ICD-10-CM

## 2017-01-05 DIAGNOSIS — E669 Obesity, unspecified: Secondary | ICD-10-CM | POA: Diagnosis present

## 2017-01-05 DIAGNOSIS — I700 Atherosclerosis of aorta: Secondary | ICD-10-CM | POA: Diagnosis present

## 2017-01-05 DIAGNOSIS — E785 Hyperlipidemia, unspecified: Secondary | ICD-10-CM | POA: Diagnosis present

## 2017-01-05 DIAGNOSIS — Z7951 Long term (current) use of inhaled steroids: Secondary | ICD-10-CM

## 2017-01-05 DIAGNOSIS — I272 Pulmonary hypertension, unspecified: Secondary | ICD-10-CM | POA: Diagnosis present

## 2017-01-05 DIAGNOSIS — Z9114 Patient's other noncompliance with medication regimen: Secondary | ICD-10-CM

## 2017-01-05 DIAGNOSIS — I25119 Atherosclerotic heart disease of native coronary artery with unspecified angina pectoris: Secondary | ICD-10-CM | POA: Diagnosis present

## 2017-01-05 DIAGNOSIS — E8770 Fluid overload, unspecified: Secondary | ICD-10-CM | POA: Diagnosis present

## 2017-01-05 DIAGNOSIS — Z6830 Body mass index (BMI) 30.0-30.9, adult: Secondary | ICD-10-CM

## 2017-01-05 HISTORY — DX: Chronic combined systolic (congestive) and diastolic (congestive) heart failure: I50.42

## 2017-01-05 LAB — BASIC METABOLIC PANEL
ANION GAP: 9 (ref 5–15)
BUN: 19 mg/dL (ref 6–20)
CALCIUM: 9.3 mg/dL (ref 8.9–10.3)
CO2: 24 mmol/L (ref 22–32)
CREATININE: 0.97 mg/dL (ref 0.61–1.24)
Chloride: 107 mmol/L (ref 101–111)
GFR calc Af Amer: >60 mL/min (ref >60)
GLUCOSE: 127 mg/dL — AB (ref 65–99)
Potassium: 3.5 mmol/L (ref 3.5–5.1)
Sodium: 140 mmol/L (ref 135–145)

## 2017-01-05 LAB — CBC
HEMATOCRIT: 40.6 % (ref 40.0–52.0)
HEMOGLOBIN: 12.7 g/dL — AB (ref 13.0–18.0)
MCH: 25.5 pg — AB (ref 26.0–34.0)
MCHC: 31.4 g/dL — ABNORMAL LOW (ref 32.0–36.0)
MCV: 81.2 fL (ref 80.0–100.0)
Platelets: 179 10*3/uL (ref 150–440)
RBC: 5 MIL/uL (ref 4.40–5.90)
RDW: 20.2 % — ABNORMAL HIGH (ref 11.5–14.5)
WBC: 5.2 10*3/uL (ref 3.8–10.6)

## 2017-01-05 LAB — GLUCOSE, CAPILLARY: Glucose-Capillary: 177 mg/dL — ABNORMAL HIGH (ref 65–99)

## 2017-01-05 LAB — TROPONIN I: Troponin I: 0.05 ng/mL (ref <0.03)

## 2017-01-05 LAB — BRAIN NATRIURETIC PEPTIDE: B Natriuretic Peptide: 1742 pg/mL — ABNORMAL HIGH (ref 0.0–100.0)

## 2017-01-05 MED ORDER — TRAZODONE HCL 50 MG PO TABS
50.0000 mg | ORAL_TABLET | Freq: Every day | ORAL | Status: DC
  Administered 2017-01-05 – 2017-01-06 (×2): 50 mg via ORAL
  Filled 2017-01-05 (×2): qty 1

## 2017-01-05 MED ORDER — CARVEDILOL 25 MG PO TABS
25.0000 mg | ORAL_TABLET | Freq: Two times a day (BID) | ORAL | Status: DC
  Administered 2017-01-05 – 2017-01-07 (×4): 25 mg via ORAL
  Filled 2017-01-05 (×4): qty 1

## 2017-01-05 MED ORDER — LABETALOL HCL 5 MG/ML IV SOLN
10.0000 mg | Freq: Once | INTRAVENOUS | Status: AC
  Administered 2017-01-05: 10 mg via INTRAVENOUS
  Filled 2017-01-05: qty 4

## 2017-01-05 MED ORDER — ISOSORBIDE MONONITRATE ER 60 MG PO TB24
60.0000 mg | ORAL_TABLET | Freq: Every day | ORAL | Status: DC
  Administered 2017-01-06 – 2017-01-07 (×2): 60 mg via ORAL
  Filled 2017-01-05 (×2): qty 1

## 2017-01-05 MED ORDER — MOMETASONE FURO-FORMOTEROL FUM 200-5 MCG/ACT IN AERO
2.0000 | INHALATION_SPRAY | Freq: Two times a day (BID) | RESPIRATORY_TRACT | Status: DC
  Administered 2017-01-06 – 2017-01-07 (×3): 2 via RESPIRATORY_TRACT
  Filled 2017-01-05: qty 8.8

## 2017-01-05 MED ORDER — HEPARIN SODIUM (PORCINE) 5000 UNIT/ML IJ SOLN
5000.0000 [IU] | Freq: Three times a day (TID) | INTRAMUSCULAR | Status: DC
  Administered 2017-01-05 – 2017-01-06 (×4): 5000 [IU] via SUBCUTANEOUS
  Filled 2017-01-05 (×4): qty 1

## 2017-01-05 MED ORDER — INSULIN ASPART 100 UNIT/ML ~~LOC~~ SOLN
0.0000 [IU] | Freq: Three times a day (TID) | SUBCUTANEOUS | Status: DC
  Administered 2017-01-06: 3 [IU] via SUBCUTANEOUS
  Administered 2017-01-06 (×2): 1 [IU] via SUBCUTANEOUS
  Filled 2017-01-05 (×3): qty 1

## 2017-01-05 MED ORDER — FUROSEMIDE 40 MG PO TABS: 40.0000 mg | ORAL_TABLET | Freq: Two times a day (BID) | ORAL | Status: DC

## 2017-01-05 MED ORDER — ATORVASTATIN CALCIUM 20 MG PO TABS
20.0000 mg | ORAL_TABLET | Freq: Every day | ORAL | Status: DC
  Administered 2017-01-06 – 2017-01-07 (×2): 20 mg via ORAL
  Filled 2017-01-05 (×2): qty 1

## 2017-01-05 MED ORDER — MORPHINE SULFATE (PF) 2 MG/ML IV SOLN
1.0000 mg | INTRAVENOUS | Status: DC | PRN
  Administered 2017-01-05: 1 mg via INTRAVENOUS
  Filled 2017-01-05: qty 1

## 2017-01-05 MED ORDER — ASPIRIN 81 MG PO CHEW
324.0000 mg | CHEWABLE_TABLET | Freq: Once | ORAL | Status: AC
  Administered 2017-01-05: 324 mg via ORAL
  Filled 2017-01-05: qty 4

## 2017-01-05 MED ORDER — NICOTINE 7 MG/24HR TD PT24
7.0000 mg | MEDICATED_PATCH | TRANSDERMAL | Status: DC
  Filled 2017-01-05 (×3): qty 1

## 2017-01-05 MED ORDER — DOCUSATE SODIUM 100 MG PO CAPS
100.0000 mg | ORAL_CAPSULE | Freq: Two times a day (BID) | ORAL | Status: DC
  Administered 2017-01-05 – 2017-01-07 (×4): 100 mg via ORAL
  Filled 2017-01-05 (×4): qty 1

## 2017-01-05 MED ORDER — CARVEDILOL 25 MG PO TABS: 25.0000 mg | ORAL_TABLET | Freq: Two times a day (BID) | ORAL | Status: DC

## 2017-01-05 MED ORDER — SPIRONOLACTONE 25 MG PO TABS
25.0000 mg | ORAL_TABLET | Freq: Every day | ORAL | Status: DC
  Administered 2017-01-06 – 2017-01-07 (×2): 25 mg via ORAL
  Filled 2017-01-05 (×2): qty 1

## 2017-01-05 MED ORDER — AMLODIPINE BESYLATE 5 MG PO TABS
ORAL_TABLET | ORAL | Status: AC
  Administered 2017-01-05: 10 mg via ORAL
  Filled 2017-01-05: qty 2

## 2017-01-05 MED ORDER — LISINOPRIL 10 MG PO TABS
40.0000 mg | ORAL_TABLET | Freq: Once | ORAL | Status: AC
  Administered 2017-01-05: 40 mg via ORAL
  Filled 2017-01-05: qty 4

## 2017-01-05 MED ORDER — NITROGLYCERIN 0.4 MG SL SUBL
0.4000 mg | SUBLINGUAL_TABLET | SUBLINGUAL | Status: DC | PRN
  Administered 2017-01-07 (×3): 0.4 mg via SUBLINGUAL
  Filled 2017-01-05: qty 1

## 2017-01-05 MED ORDER — ASPIRIN 81 MG PO CHEW
81.0000 mg | CHEWABLE_TABLET | Freq: Every day | ORAL | Status: DC
  Administered 2017-01-06 – 2017-01-07 (×2): 81 mg via ORAL
  Filled 2017-01-05 (×2): qty 1

## 2017-01-05 MED ORDER — AMLODIPINE BESYLATE 5 MG PO TABS
10.0000 mg | ORAL_TABLET | Freq: Once | ORAL | Status: AC
  Administered 2017-01-05: 10 mg via ORAL
  Filled 2017-01-05: qty 2

## 2017-01-05 MED ORDER — FUROSEMIDE 10 MG/ML IJ SOLN
20.0000 mg | Freq: Three times a day (TID) | INTRAMUSCULAR | Status: DC
  Administered 2017-01-05 – 2017-01-06 (×2): 20 mg via INTRAVENOUS
  Filled 2017-01-05 (×2): qty 2

## 2017-01-05 MED ORDER — METFORMIN HCL 500 MG PO TABS
1000.0000 mg | ORAL_TABLET | Freq: Every day | ORAL | Status: DC
  Administered 2017-01-06 – 2017-01-07 (×2): 1000 mg via ORAL
  Filled 2017-01-05 (×2): qty 2

## 2017-01-05 MED ORDER — DOCUSATE SODIUM 100 MG PO CAPS: 100.0000 mg | ORAL_CAPSULE | Freq: Two times a day (BID) | ORAL | Status: DC | PRN

## 2017-01-05 NOTE — ED Notes (Signed)
Pt given meal tray.

## 2017-01-05 NOTE — ED Notes (Signed)
ED Provider at bedside. MD Sharma CovertNorman

## 2017-01-05 NOTE — ED Provider Notes (Addendum)
[facility] Emergency Department Provider Note  ____________________________________________  Time seen: Approximately 4:13 PM  I have reviewed the triage vital signs and the nursing notes.   HISTORY  Chief Complaint Chest Pain    HPI [name] is a 61 y.o. male with a history of CAD status post stents, CHF, COPD, HTN, presenting for syncope. The patient reports that he saw his primary care physician this morning at the [facility] after 3 months of generalized fatigue and weakness with uncontrolled hypertension at home.He was directed to a cardiologist, outpatient, and was discharged from that appointment with a follow-up in several weeks. When he arrived home, the patient sat down and the edge of the bed to lie down when he became lightheaded and had a cold sweat and shortness of breath, with a syncopal event. He does not remember what happened but denies any associated chest pain, palpitations, or injury from his syncope. The syncopal event was brief, he did not have any tonic-clonic activity, or any urinary incontinence. The patient also describes that yesterday evening when he was laying down to sleep, he had a sharp pain just under the right clavicle that lasted for several minutes and resolved spontaneously. He does get regular angina, which she treats with nitroglycerin, and has had no change in the frequency or character of his angina. The chest pain last night also resolved with nitroglycerin.  At this time, the patient is a symptomatic.   Past Medical History:  Diagnosis Date  . CAD in native artery    a. stress echo 12/2007 abnl, EF > 55%, b. LHC 01/28/08: mLAD 30, D1 40, dLCx 70, pRCA 30, mRCA 70, mRCA lesion 2 80, PDA 90, s/p PCI/BMS to prox and distal RCA, s/p PCI/BMS to PDA; c. patient reports PCI/stenting x 2 in early 2017 at the [facility] (no records on file)  . Chronic diastolic CHF (congestive heart failure) (HCC)    a. echo 2009: > 55%  . COPD (chronic  obstructive pulmonary disease) (HCC)   . Gout   . Hyperlipidemia   . Hypertensive heart disease   . Obesity   . Tobacco abuse     Patient Active Problem List   Diagnosis Date Noted  . Ischemic cardiomyopathy 07/26/2016  . Non-sustained ventricular tachycardia (HCC) 07/26/2016  . COPD (chronic obstructive pulmonary disease) (HCC) 07/24/2016  . CAD (coronary artery disease) 07/24/2016  . HLD (hyperlipidemia) 07/24/2016  . Acute on chronic systolic CHF (congestive heart failure), NYHA class 3 (HCC) 07/24/2016  . Acute respiratory distress 07/24/2016  . Tobacco abuse 07/24/2016  . Accelerated hypertension 07/24/2016  . Hypertensive heart disease 07/24/2016    Past Surgical History:  Procedure Laterality Date  . CARDIAC CATHETERIZATION  2009   [facility];   . CARDIAC CATHETERIZATION  2010  . CARDIAC CATHETERIZATION N/A 07/28/2016   Procedure: Right and Left Heart Cath and possible PCI;  Surgeon: Iran Ouch, MD;  Location: [facility] INVASIVE CV LAB;  Service: Cardiovascular;  Laterality: N/A;  . CORONARY ANGIOPLASTY  2009   s/p stent placement at [facility].    Current Outpatient Rx  . Order #: [id number] Class: Historical Med  . Order #: [id number] Class: Historical Med  . Order #: [id number] Class: Historical Med  . Order #: [id number] Class: Historical Med  . Order #: [id number] Class: Historical Med  . Order #: [id number] Class: Historical Med  . Order #: [id number] Class: Normal  . Order #: [id number] Class: Historical Med  . Order #: [id number] Class: Historical Med  . Order #: [id number] Class: Historical Med  .  Order #: [id number] Class: Normal  . Order #: [id number] Class: Historical Med  . Order #: [id number] Class: Normal  . Order #: [id number] Class: Historical Med  . Order #: [id number] Class: Historical Med  . Order #: [id number] Class: Historical Med  . Order #: [id number] Class: Historical Med  . Order #: [id number] Class: Historical Med  . Order #: [id number] Class: Historical Med  . Order #: [id number] Class:  Historical Med  . Order #: [id number] Class: Normal  . Order #: [id number] Class: Historical Med    Allergies Patient has no known allergies.  Family History  Problem Relation Age of Onset  . Heart attack Mother 8    Social History Social History  Substance Use Topics  . Smoking status: Former Smoker    Packs/day: 1.00    Types: Cigarettes  . Smokeless tobacco: Never Used  . Alcohol use No    Review of Systems Constitutional: No fever/chills.Positive lightheadedness or syncope. Positive generalized malaise for 3 months. Positive uncontrolled hypertension for several weeks. Eyes: No visual changes. No blurred or double vision. ENT: No sore throat. No congestion or rhinorrhea. Cardiovascular: Positive chest pain. Denies palpitations. Respiratory: Positive shortness of breath.  No cough. Gastrointestinal: No abdominal pain.  No nausea, no vomiting.  No diarrhea.  No constipation. Genitourinary: Negative for dysuria. Musculoskeletal: Negative for back pain. Skin: Negative for rash. Neurological: Negative for headaches. No focal numbness, tingling or weakness.     ____________________________________________   PHYSICAL EXAM:  VITAL SIGNS: ED Triage Vitals  Enc Vitals Group     BP 01/05/17 1430 (!) 160/97     Pulse Rate 01/05/17 1429 78     Resp 01/05/17 1429 18     Temp 01/05/17 1429 97.9 F (36.6 C)     Temp Source 01/05/17 1429 Oral     SpO2 01/05/17 1429 98 %     Weight 01/05/17 1429 224 lb (101.6 kg)     Height 01/05/17 1429 5\' 11"  (1.803 m)     Head Circumference --      Peak Flow --      Pain Score 01/05/17 1429 9     Pain Loc --      Pain Edu? --      Excl. in GC? --     Constitutional: Alert and oriented. Chronically ill appearing and in no acute distress. Answers questions appropriately. Eyes: Conjunctivae are normal.  EOMI. No scleral icterus. Head: Atraumatic. Nose: No congestion/rhinnorhea. Mouth/Throat: Mucous membranes are moist.  Neck: No  stridor.  Supple.  No JVD. No meningismus. Cardiovascular: Normal rate, regular rhythm. No murmurs, rubs or gallops.  Respiratory: Normal respiratory effort.  No accessory muscle use or retractions. Lungs CTAB.  No wheezes, rales or ronchi. Gastrointestinal: Soft, nontender and nondistended.  No guarding or rebound.  No peritoneal signs. Musculoskeletal: Bilateral symmetric pitting LE edema to the mid tibial shaft. No ttp in the calves or palpable cords.  Negative Homan's sign. Neurologic:  A&Ox3.  Speech is clear.  Face and smile are symmetric.  EOMI.  Moves all extremities well. Skin:  Skin is warm, dry and intact. No rash noted. Psychiatric: Mood and affect are normal. Speech and behavior are normal.  Normal judgement.  ____________________________________________   LABS (all labs ordered are listed, but only abnormal results are displayed)  Labs Reviewed  BASIC METABOLIC PANEL - Abnormal; Notable for the following:       Result Value   Glucose, Bld 127 (*)    All other components within normal limits  CBC - Abnormal; Notable for  the following:    Hemoglobin 12.7 (*)    MCH 25.5 (*)    MCHC 31.4 (*)    RDW 20.2 (*)    All other components within normal limits  TROPONIN I - Abnormal; Notable for the following:    Troponin I 0.05 (*)    All other components within normal limits  BRAIN NATRIURETIC PEPTIDE - Abnormal; Notable for the following:    B Natriuretic Peptide 1,742.0 (*)    All other components within normal limits   ____________________________________________  EKG  ED ECG REPORT I, Rockne Menghini, the attending physician, personally viewed and interpreted this ECG.   Date: 01/05/2017  EKG Time: 1435  Rate: 76  Rhythm: normal sinus rhythm  Axis: leftward  Intervals prolonged QTc  ST&T Change: No STEMI  ____________________________________________  RADIOLOGY  Dg Chest 2 View  Result Date: 01/05/2017 CLINICAL DATA:  Chest pain. EXAM: CHEST  2 VIEW  COMPARISON:  Radiographs of July 23, 2016. FINDINGS: Stable cardiomegaly with central pulmonary vascular congestion. Atherosclerosis of thoracic aorta is noted. No pneumothorax or pleural effusion is noted. No consolidative process is noted. Bony thorax is unremarkable. IMPRESSION: Aortic atherosclerosis. Stable cardiomegaly with central pulmonary vascular congestion. Electronically Signed   By: Lupita Raider, M.D.   On: 01/05/2017 15:09    ____________________________________________   PROCEDURES  Procedure(s) performed: None  Procedures  Critical Care performed: Yes, see critical care note(s) ____________________________________________   INITIAL IMPRESSION / ASSESSMENT AND PLAN / ED COURSE  Pertinent labs & imaging results that were available during my care of the patient were reviewed by me and considered in my medical decision making (see chart for details).  61 y.o. male with a history of CAD, CHF, hypertension, presenting with syncope. Overall, the patient is very hypertensive with a blood pressure of 182/153 on my examination. In addition, the patient has a positive troponin and I am concerned that with the episode of chest pain that he had last night in the syncope today that he has had an NSTEMI with hypertensive urgency. The patient will be treated with aspirin, and immediately be given his multiple blood pressure medications which he has not taken today yet. I will monitor his blood pressure and use intravenous medications if he does not have a response within 30 minutes. At this time, the patient is chest pain-free, so heparinization is not warranted but if he does develop any further symptoms, I will consider this additional therapy. [facility]  ----------------------------------------- 6:14 PM on 01/05/2017 -----------------------------------------  Patient continues to be hypertensive despite his home medications. This time, we'll initiate  IV labetalol and reevaluate the patient. I'm still waiting to hear back from the [place]; the papers were faxed sometime ago.  ----------------------------------------- 6:34 PM on 01/05/2017 -----------------------------------------  [facility], and they will get back to [place] on whether the patient will be able to be transferred to patient continues to be hypertensive but did have some improvement with Labetalol, no chest pain.  CRITICAL CARE Performed by: Rockne Menghini   Total critical care time: 45 minutes  Critical care time was exclusive of separately billable procedures and treating other patients.  Critical care was necessary to treat or prevent imminent or life-threatening deterioration.  Critical care was time spent personally by me on the following activities: development of treatment plan with patient and/or surrogate as well as nursing, discussions with consultants, evaluation of patient's response to treatment, examination of patient, obtaining history from  patient or surrogate, ordering and performing treatments and interventions, ordering and review of laboratory studies, ordering and review of radiographic studies, pulse oximetry and re-evaluation of patient's condition.   ____________________________________________  FINAL CLINICAL IMPRESSION(S) / ED DIAGNOSES  Final diagnoses:  Hypertensive urgency  NSTEMI (non-ST elevated myocardial infarction) (HCC)  Syncope, unspecified syncope type  Acute on chronic congestive heart failure, unspecified heart failure type [facility](HCC)         NEW MEDICATIONS STARTED DURING THIS VISIT:  New Prescriptions   No medications on file      Rockne MenghiniNorman, Anne-Caroline, MD 01/05/17 1623    Rockne MenghiniNorman, Anne-Caroline, MD 01/05/17 1833    Rockne MenghiniNorman, Anne-Caroline, MD 01/05/17 [phone]

## 2017-01-05 NOTE — H&P (Signed)
[employer] [facility] [place] [facility]   PATIENT NAME: [name]    MR#:  [id number]  DATE OF BIRTH:  1956-05-26  DATE OF ADMISSION:  01/05/2017  PRIMARY CARE PHYSICIAN: [facility]   REQUESTING/REFERRING PHYSICIAN: Sharma CovertNorman  CHIEF COMPLAINT:   Chief Complaint  Patient presents with  . Chest Pain    HISTORY OF PRESENT ILLNESS: James CluckSamuel Sistare  is a 61 y.o. male with a known history of Coronary artery disease, chronic diastolic CHF, COPD, gout, hyperlipidemia, hypertensive heart disease, smoking, obesity- [facility]. For last few weeks he has on and off episodes of central chest pain with activities and it resolves with using nitroglycerin tablets. He went for his regular checkup to his primary care clinic today, while listening to these symptoms they referred him to cardiology clinic and he went to the cardiology clinic today. From cardiology clinic and they told him to come after 2 weeks and continue the same medications. After coming home he again had chest pain and he could not arrange for transportation to [facility] so came here. His BNP is slightly elevated and troponin is slightly high and symptoms are very typical of coronary artery disease. So ER physician suggested [facility] but they are on arrival lesion and advised to admit to hospital for further workup.  PAST MEDICAL HISTORY:   Past Medical History:  Diagnosis Date  . CAD in native artery    a. stress echo 12/2007 abnl, EF > 55%, b. LHC 01/28/08: mLAD 30, D1 40, dLCx 70, pRCA 30, mRCA 70, mRCA lesion 2 80, PDA 90, s/p PCI/BMS to prox and distal RCA, s/p PCI/BMS to PDA; c. patient [facility] (no records on file)  . Chronic diastolic CHF (congestive heart failure) (HCC)    a. echo 2009: > 55%  . COPD (chronic obstructive pulmonary disease) (HCC)   . Gout   . Hyperlipidemia   . Hypertensive heart disease   . Obesity    . Tobacco abuse     PAST SURGICAL HISTORY: Past Surgical History:  Procedure Laterality Date  . CARDIAC CATHETERIZATION  2009   [facility];   . CARDIAC CATHETERIZATION  2010  . CARDIAC CATHETERIZATION N/A 07/28/2016   Procedure: Right and Left Heart Cath and possible PCI;  Surgeon: Iran OuchMuhammad A Arida, MD;  Location: [facility] INVASIVE CV LAB;  Service: Cardiovascular;  Laterality: N/A;  . CORONARY ANGIOPLASTY  2009   [facility].    SOCIAL HISTORY:  Social History  Substance Use Topics  . Smoking status: Former Smoker    Packs/day: 1.00    Types: Cigarettes  . Smokeless tobacco: Never Used  . Alcohol use No    FAMILY HISTORY:  Family History  Problem Relation Age of Onset  . Heart attack Mother 3555    DRUG ALLERGIES: No Known Allergies  REVIEW OF SYSTEMS:   CONSTITUTIONAL: No fever, fatigue or weakness.  EYES: No blurred or double vision.  EARS, NOSE, AND THROAT: No tinnitus or ear pain.  RESPIRATORY: No cough, shortness of breath, wheezing or hemoptysis.  CARDIOVASCULAR: Positive for chest pain, no orthopnea, edema.  GASTROINTESTINAL: No nausea, vomiting, diarrhea or abdominal pain.  GENITOURINARY: No dysuria, hematuria.  ENDOCRINE: No polyuria, nocturia,  HEMATOLOGY: No anemia, easy bruising or bleeding SKIN: No rash or lesion. MUSCULOSKELETAL: No joint pain or arthritis.   NEUROLOGIC: No tingling, numbness, weakness.  PSYCHIATRY: No anxiety or depression.   MEDICATIONS AT HOME:  Prior to Admission medications   Medication Sig Start Date End Date Taking? Authorizing Provider  albuterol-ipratropium (COMBIVENT) 18-103 MCG/ACT inhaler Inhale 2 puffs into the lungs every 4 (four) hours as needed for wheezing.   Yes [provider]  aspirin 81 MG tablet Take 81 mg by mouth daily.   Yes [provider]  atorvastatin (LIPITOR) 40 MG tablet Take 20 mg by mouth daily.   Yes [provider]  budesonide-formoterol (SYMBICORT) 160-4.5 MCG/ACT  inhaler Inhale 2 puffs into the lungs 2 (two) times daily.   Yes [provider]  carvedilol (COREG) 25 MG tablet Take 1 tablet (25 mg total) by mouth 2 (two) times daily with a meal. 07/31/16  Yes Enedina Finner, MD  furosemide (LASIX) 40 MG tablet Take 1 tablet (40 mg total) by mouth 2 (two) times daily. 07/31/16  Yes Enedina Finner, MD  isosorbide mononitrate (IMDUR) 60 MG 24 hr tablet Take 60 mg by mouth daily.   Yes [provider]  metFORMIN (GLUCOPHAGE) 1000 MG tablet Take 1,000 mg by mouth daily with breakfast.    Yes [provider]  nicotine (NICODERM CQ - DOSED IN MG/24 HR) 7 mg/24hr patch Place 1 patch onto the skin daily.   Yes [provider]  spironolactone (ALDACTONE) 25 MG tablet Take 1 tablet (25 mg total) by mouth daily. 08/01/16  Yes Enedina Finner, MD  traZODone (DESYREL) 50 MG tablet Take 50 mg by mouth at bedtime.   Yes [provider]  docusate sodium (COLACE) 100 MG capsule Take 100 mg by mouth 2 (two) times daily.    [provider]  isosorbide-hydrALAZINE (BIDIL) 20-37.5 MG tablet Take 1 tablet by mouth 3 (three) times daily. Patient not taking: Reported on 01/05/2017 07/31/16   Enedina Finner, MD  nitroGLYCERIN (NITROSTAT) 0.4 MG SL tablet Place 0.4 mg under the tongue every 5 (five) minutes as needed for chest pain.    [provider]  ondansetron (ZOFRAN) 4 MG tablet Take 4 mg by mouth every 4 (four) hours as needed for nausea.    [provider]      PHYSICAL EXAMINATION:   VITAL SIGNS: Blood pressure (!) 153/101, pulse 77, temperature 97.9 F (36.6 C), temperature source Oral, resp. rate 16, height 5\' 11"  (1.803 m), weight 101.6 kg (224 lb), SpO2 95 %.  GENERAL:  60 y.o.-year-old patient lying in the bed with no acute distress.  EYES: Pupils equal, round, reactive to light and accommodation. No scleral icterus. Extraocular muscles intact.  HEENT: Head atraumatic, normocephalic. Oropharynx and nasopharynx clear.   NECK:  Supple, no jugular venous distention. No thyroid enlargement, no tenderness.  LUNGS: Normal breath sounds bilaterally, no wheezing, rales,rhonchi or crepitation. No use of accessory muscles of respiration.  CARDIOVASCULAR: S1, S2 normal. No murmurs, rubs, or gallops.  ABDOMEN: Soft, nontender, nondistended. Bowel sounds present. No organomegaly or mass.  EXTREMITIES: No pedal edema, cyanosis, or clubbing.  NEUROLOGIC: Cranial nerves II through XII are intact. Muscle strength 5/5 in all extremities. Sensation intact. Gait not checked.  PSYCHIATRIC: The patient is alert and oriented x 3.  SKIN: No obvious rash, lesion, or ulcer.   LABORATORY PANEL:   CBC  Recent Labs Lab 01/05/17 1434  WBC 5.2  HGB 12.7*  HCT 40.6  PLT 179  MCV 81.2  MCH 25.5*  MCHC 31.4*  RDW 20.2*   ------------------------------------------------------------------------------------------------------------------  Chemistries   Recent Labs Lab 01/05/17 1434  NA 140  K 3.5  CL 107  CO2 24  GLUCOSE 127*  BUN 19  CREATININE 0.97  CALCIUM 9.3   ------------------------------------------------------------------------------------------------------------------ estimated creatinine clearance is 98.3 mL/min (by C-G formula based on SCr of 0.97 mg/dL). ------------------------------------------------------------------------------------------------------------------ No results for input(s): TSH, T4TOTAL, T3FREE, THYROIDAB in the last 72 hours.  Invalid input(s): FREET3   Coagulation profile No results for input(s): INR, PROTIME in the last 168 hours. ------------------------------------------------------------------------------------------------------------------- No results for input(s): DDIMER in the last 72 hours. -------------------------------------------------------------------------------------------------------------------  Cardiac Enzymes  Recent Labs Lab 01/05/17 1434  TROPONINI  0.05*   ------------------------------------------------------------------------------------------------------------------ Invalid input(s): POCBNP  ---------------------------------------------------------------------------------------------------------------  Urinalysis    Component Value Date/Time   COLORURINE Yellow 12/08/2012 1005   APPEARANCEUR Hazy 12/08/2012 1005   LABSPEC 1.028 12/08/2012 1005   PHURINE 5.0 12/08/2012 1005   GLUCOSEU Negative 12/08/2012 1005   HGBUR Negative 12/08/2012 1005   BILIRUBINUR Negative 12/08/2012 1005   KETONESUR Negative 12/08/2012 1005   PROTEINUR 100 mg/dL 16/03/9603 5409   NITRITE Negative 12/08/2012 1005   LEUKOCYTESUR Negative 12/08/2012 1005     RADIOLOGY: Dg Chest 2 View  Result Date: 01/05/2017 CLINICAL DATA:  Chest pain. EXAM: CHEST  2 VIEW COMPARISON:  Radiographs of July 23, 2016. FINDINGS: Stable cardiomegaly with central pulmonary vascular congestion. Atherosclerosis of thoracic aorta is noted. No pneumothorax or pleural effusion is noted. No consolidative process is noted. Bony thorax is unremarkable. IMPRESSION: Aortic atherosclerosis. Stable cardiomegaly with central pulmonary vascular congestion. Electronically Signed   By: Lupita Raider, M.D.   On: 01/05/2017 15:09    EKG: Orders placed or performed during the hospital encounter of 01/05/17  . ED EKG within 10 minutes  . ED EKG within 10 minutes    IMPRESSION AND PLAN:  * Chest pain likely anginal, history of coronary artery disease.   We will monitor on telemetry follow serial troponin.   Patient had cardiac catheterization done in January 2018 showing less than 50% blockages but severely elevated left atrial pressure.   I will call cardiology consult and let them decide on further workup.   As he has history of coronary artery disease, continue all current cardiac medications.  * Hypertension   Continue home medications.  * Diabetes   Continue home  medication and keep on sliding scale coverage.  * Acute diastolic CHF   Give IV Lasix and monitor on telemetry.   Intake and output measurement and fluid restrictions.  * COPD    no acute exacerbation, continue his inhalers.  All the records are reviewed and case discussed with ED provider. Management plans discussed with the patient, family and they are in agreement.  CODE STATUS: full code.  Code Status History    Date Active Date Inactive Code Status Order ID Comments User Context   07/24/2016  2:35 AM 07/31/2016  2:01 PM Full Code [id number]  Oralia Manis, MD Inpatient       TOTAL TIME TAKING CARE OF THIS PATIENT: 50 minutes.    Altamese Dilling M.D on 01/05/2017   Between 7am to 6pm - Pager - [phone]  After 6pm go to [email or web address] - password [email or web address]  [facility] [place] [facility]  Office  [phone]  CC: Primary care physician; [facility]   Note: This dictation was prepared with Dragon dictation along with smaller phrase technology. Any transcriptional errors that result from this process are unintentional.

## 2017-01-05 NOTE — ED Notes (Signed)
Per EMS patient presents to the ED with cough and congestion with chest soreness on palpation and with cough.  [facility] and patient told EMS he is tired of feeling poorly and wants a "second opinion."

## 2017-01-05 NOTE — ED Triage Notes (Signed)
Pt c/o central chest pain that has been intermittent today.  Took NTG X 2 at home and went away. Pt also c/o bad cough.  Reports has had pain like this in chest for 2 months though.  [facility]  Feels SHOB and has had cough. NAD. VSS. Respirations unlabored.

## 2017-01-05 NOTE — ED Notes (Signed)
Pt given cup of ice. 

## 2017-01-06 ENCOUNTER — Encounter: Payer: Self-pay | Admitting: Student

## 2017-01-06 DIAGNOSIS — Z7951 Long term (current) use of inhaled steroids: Secondary | ICD-10-CM | POA: Diagnosis not present

## 2017-01-06 DIAGNOSIS — E785 Hyperlipidemia, unspecified: Secondary | ICD-10-CM | POA: Diagnosis present

## 2017-01-06 DIAGNOSIS — Z79899 Other long term (current) drug therapy: Secondary | ICD-10-CM | POA: Diagnosis not present

## 2017-01-06 DIAGNOSIS — Z9119 Patient's noncompliance with other medical treatment and regimen: Secondary | ICD-10-CM | POA: Diagnosis not present

## 2017-01-06 DIAGNOSIS — J449 Chronic obstructive pulmonary disease, unspecified: Secondary | ICD-10-CM | POA: Diagnosis present

## 2017-01-06 DIAGNOSIS — Z955 Presence of coronary angioplasty implant and graft: Secondary | ICD-10-CM | POA: Diagnosis not present

## 2017-01-06 DIAGNOSIS — I160 Hypertensive urgency: Secondary | ICD-10-CM | POA: Diagnosis present

## 2017-01-06 DIAGNOSIS — I255 Ischemic cardiomyopathy: Secondary | ICD-10-CM | POA: Diagnosis present

## 2017-01-06 DIAGNOSIS — E669 Obesity, unspecified: Secondary | ICD-10-CM | POA: Diagnosis present

## 2017-01-06 DIAGNOSIS — Z9114 Patient's other noncompliance with medication regimen: Secondary | ICD-10-CM | POA: Diagnosis not present

## 2017-01-06 DIAGNOSIS — R0603 Acute respiratory distress: Secondary | ICD-10-CM | POA: Diagnosis present

## 2017-01-06 DIAGNOSIS — Z6830 Body mass index (BMI) 30.0-30.9, adult: Secondary | ICD-10-CM | POA: Diagnosis not present

## 2017-01-06 DIAGNOSIS — E8770 Fluid overload, unspecified: Secondary | ICD-10-CM | POA: Diagnosis present

## 2017-01-06 DIAGNOSIS — Z7982 Long term (current) use of aspirin: Secondary | ICD-10-CM | POA: Diagnosis not present

## 2017-01-06 DIAGNOSIS — I272 Pulmonary hypertension, unspecified: Secondary | ICD-10-CM | POA: Diagnosis present

## 2017-01-06 DIAGNOSIS — R55 Syncope and collapse: Secondary | ICD-10-CM | POA: Diagnosis present

## 2017-01-06 DIAGNOSIS — F17210 Nicotine dependence, cigarettes, uncomplicated: Secondary | ICD-10-CM | POA: Diagnosis present

## 2017-01-06 DIAGNOSIS — I214 Non-ST elevation (NSTEMI) myocardial infarction: Secondary | ICD-10-CM | POA: Diagnosis present

## 2017-01-06 DIAGNOSIS — Z7984 Long term (current) use of oral hypoglycemic drugs: Secondary | ICD-10-CM | POA: Diagnosis not present

## 2017-01-06 DIAGNOSIS — I5043 Acute on chronic combined systolic (congestive) and diastolic (congestive) heart failure: Secondary | ICD-10-CM | POA: Diagnosis present

## 2017-01-06 DIAGNOSIS — I700 Atherosclerosis of aorta: Secondary | ICD-10-CM | POA: Diagnosis present

## 2017-01-06 DIAGNOSIS — I110 Hypertensive heart disease with heart failure: Secondary | ICD-10-CM | POA: Diagnosis present

## 2017-01-06 DIAGNOSIS — E119 Type 2 diabetes mellitus without complications: Secondary | ICD-10-CM | POA: Diagnosis present

## 2017-01-06 DIAGNOSIS — I25119 Atherosclerotic heart disease of native coronary artery with unspecified angina pectoris: Secondary | ICD-10-CM | POA: Diagnosis present

## 2017-01-06 DIAGNOSIS — I208 Other forms of angina pectoris: Secondary | ICD-10-CM

## 2017-01-06 DIAGNOSIS — I2510 Atherosclerotic heart disease of native coronary artery without angina pectoris: Secondary | ICD-10-CM

## 2017-01-06 DIAGNOSIS — Z8249 Family history of ischemic heart disease and other diseases of the circulatory system: Secondary | ICD-10-CM | POA: Diagnosis not present

## 2017-01-06 DIAGNOSIS — I5033 Acute on chronic diastolic (congestive) heart failure: Secondary | ICD-10-CM | POA: Diagnosis present

## 2017-01-06 LAB — LIPID PANEL
CHOLESTEROL: 119 mg/dL (ref 0–200)
HDL: 36 mg/dL — AB (ref >40)
LDL CALC: 64 mg/dL (ref 0–99)
TRIGLYCERIDES: 93 mg/dL (ref <150)
Total CHOL/HDL Ratio: 3.3 RATIO
VLDL: 19 mg/dL (ref 0–40)

## 2017-01-06 LAB — GLUCOSE, CAPILLARY
Glucose-Capillary: 204 mg/dL — ABNORMAL HIGH (ref 65–99)
Glucose-Capillary: 135 mg/dL — ABNORMAL HIGH (ref 65–99)
Glucose-Capillary: 132 mg/dL — ABNORMAL HIGH (ref 65–99)
Glucose-Capillary: 118 mg/dL — ABNORMAL HIGH (ref 65–99)

## 2017-01-06 LAB — CBC
HEMATOCRIT: 39.1 % — AB (ref 40.0–52.0)
HEMOGLOBIN: 12.5 g/dL — AB (ref 13.0–18.0)
MCH: 26.1 pg (ref 26.0–34.0)
MCHC: 32 g/dL (ref 32.0–36.0)
MCV: 81.8 fL (ref 80.0–100.0)
Platelets: 167 10*3/uL (ref 150–440)
RBC: 4.78 MIL/uL (ref 4.40–5.90)
RDW: 20.3 % — AB (ref 11.5–14.5)
WBC: 5.6 10*3/uL (ref 3.8–10.6)

## 2017-01-06 LAB — TROPONIN I
TROPONIN I: 0.05 ng/mL — AB (ref <0.03)
Troponin I: 0.06 ng/mL (ref <0.03)

## 2017-01-06 LAB — BASIC METABOLIC PANEL
ANION GAP: 6 (ref 5–15)
BUN: 17 mg/dL (ref 6–20)
CHLORIDE: 108 mmol/L (ref 101–111)
CO2: 28 mmol/L (ref 22–32)
Calcium: 8.9 mg/dL (ref 8.9–10.3)
Creatinine, Ser: 0.87 mg/dL (ref 0.61–1.24)
GFR calc Af Amer: >60 mL/min (ref >60)
GFR calc non Af Amer: >60 mL/min (ref >60)
GLUCOSE: 115 mg/dL — AB (ref 65–99)
POTASSIUM: 3.3 mmol/L — AB (ref 3.5–5.1)
Sodium: 142 mmol/L (ref 135–145)

## 2017-01-06 MED ORDER — PREMIER PROTEIN SHAKE
11.0000 [oz_av] | Freq: Two times a day (BID) | ORAL | Status: DC
  Administered 2017-01-06 – 2017-01-07 (×2): 11 [oz_av] via ORAL

## 2017-01-06 MED ORDER — ALBUTEROL SULFATE (2.5 MG/3ML) 0.083% IN NEBU
2.5000 mg | INHALATION_SOLUTION | RESPIRATORY_TRACT | Status: DC | PRN
  Administered 2017-01-06: 2.5 mg via RESPIRATORY_TRACT
  Filled 2017-01-06: qty 3

## 2017-01-06 MED ORDER — POTASSIUM CHLORIDE CRYS ER 20 MEQ PO TBCR
40.0000 meq | EXTENDED_RELEASE_TABLET | Freq: Once | ORAL | Status: AC
  Administered 2017-01-06: 40 meq via ORAL
  Filled 2017-01-06: qty 2

## 2017-01-06 MED ORDER — ACETAMINOPHEN 325 MG PO TABS: 650.0000 mg | ORAL_TABLET | Freq: Four times a day (QID) | ORAL | Status: DC | PRN

## 2017-01-06 MED ORDER — FUROSEMIDE 10 MG/ML IJ SOLN
40.0000 mg | Freq: Two times a day (BID) | INTRAMUSCULAR | Status: DC
  Administered 2017-01-06 – 2017-01-07 (×3): 40 mg via INTRAVENOUS
  Filled 2017-01-06 (×2): qty 4

## 2017-01-06 NOTE — Progress Notes (Signed)
Nutrition Education Note  RD provided "Low Sodium Nutrition Therapy" handout from the Academy of Nutrition and [employer]etics. Reviewed patient's dietary recall. Provided examples on ways to decrease sodium intake in diet. Discouraged intake of processed foods and use of salt shaker. Encouraged fresh fruits and vegetables as well as whole grain sources of carbohydrates to maximize fiber intake.   RD discussed why it is important for patient to adhere to diet recommendations, and emphasized the role of fluids, foods to avoid, and importance of weighing self daily.   RD also provided "Nutrition with Type II Diabetes" handout from the Academy of Nutrition and Dietetics. Discussed different food groups and their effects on blood sugar, emphasizing carbohydrate-containing foods. Provided list of carbohydrates and recommended serving sizes of common foods.  Discussed importance of controlled and consistent carbohydrate intake throughout the day. Provided examples of ways to balance meals/snacks and encouraged intake of high-fiber, whole grain complex carbohydrates.   Teach back method used.  Expect fair compliance.  Body mass index is 34.18 kg/m. Pt meets criteria for obese based on current BMI.  Current diet order is HH/CHO modified, patient is consuming approximately 100% of meals at this time.   RD following this pt  James Holidayasey [name] MS, RD, LDN Pager #[phone] After Hours Pager: [phone]

## 2017-01-06 NOTE — Progress Notes (Addendum)
Initial Nutrition Assessment  DOCUMENTATION CODES:   Not applicable  INTERVENTION:   Premier Protein BID, each supplement provides 160kcal and 30g protein.   Double protein portions with all meals  NUTRITION DIAGNOSIS:   Increased nutrient needs related to catabolic illness (heart disease ), COPD as evidenced by increased estimated needs from protein.  GOAL:   Patient will meet greater than or equal to 90% of their needs  MONITOR:   PO intake, Supplement acceptance, Labs, Weight trends, I & O's  REASON FOR ASSESSMENT:   Malnutrition Screening Tool    ASSESSMENT:   61 y.o. male with a known history of Coronary artery disease, chronic diastolic CHF, COPD, gout, hyperlipidemia, hypertensive heart disease, smoking, obesity- follows at [facility] clinic admitted for syncope    Met with pt in room today. Pt reports poor appetite and oral intake for the past two months. Pt reports that his appetite is back today; he is currently eating 100% of meals. Per chart, pt has lost 14lbs(6%) over the past 6 months; pt reports that majority of this wt loss has been over the past two months. This is not significant wt loss. RD will order Premier Protein to help pt meet estimated protein needs. Pt also requesting double protein with meals; RD will order. Pt with hypokalemia today; monitor and supplement as needed per MD discretion. Pt educated regarding heart healthy diet today; paperwork provided to support education.   Medications reviewed and include: aspirin, colace, lasix, heparin, insulin, metformin, nicotine, spironolactone  Labs reviewed: K 3.3(L) cbgs- 127, 115 x 24hrs AIC 8.5(H)- 1/25  Nutrition-Focused physical exam completed. Findings are no fat depletion, no muscle depletion, and mioderate edema in BLE.   Diet Order:  Diet heart healthy/carb modified Room service appropriate? Yes; Fluid consistency: Thin  Skin:  Reviewed, no issues  Last BM:  7/8  Height:   Ht Readings from  Last 1 Encounters:  01/05/17 5' 11"  (1.803 m)    Weight:   Wt Readings from Last 1 Encounters:  01/06/17 220 lb 11.2 oz (100.1 kg)    Ideal Body Weight:  78.1 kg  BMI:  Body mass index is 30.78 kg/m.  Estimated Nutritional Needs:   Kcal:  2200-2500kcal/day   Protein:  100-120g/day  Fluid:  >2L/day   EDUCATION NEEDS:   Education needs addressed  Koleen Distance MS, RD, LDN Pager #[phone] After Hours Pager: [phone]

## 2017-01-06 NOTE — Care Management Obs Status (Signed)
MEDICARE OBSERVATION STATUS NOTIFICATION   Patient Details  Name: [name] MRN: [id number] Date of Birth: 1956-06-02   Medicare Observation Status Notification Given:  Yes    Eber HongGreene, Kc Summerson R, RN 01/06/2017, 8:09 AM

## 2017-01-06 NOTE — Consult Note (Signed)
Cardiology Consult    Patient ID: [name] MRN: [id number], DOB/AGE: [id number]   Admit date: 01/05/2017 Date of Consult: 01/06/2017  Primary Physician: [facility] Reason for Consult: Chest Pain Primary Cardiologist: [facility] Requesting Provider: Dr. Elisabeth Pigeon  History of Present Illness    [name] is a 61 y.o. male with past medical history of CAD (s/p BMS to dRCA and BMS to PDA in 2009, reported stenting in 2017 at the [place] - no records on file), chronic combined systolic and diastolic CHF (EF 40-98% by echo in 06/2016), hypertensive heart disease, COPD, HTN, and HLD who is being seen today for the evaluation of chest pain at the request of Dr. Elisabeth Pigeon.   He was admitted here in 06/2016 for acute on chronic diastolic CHF exacerbation. An echo at that time showed a reduced EF of 25-30%, previously normal, therefore a cardiac catheterization was recommended for further evaluation of his cardiomyopathy. This showed patent stents along the RCA and LCx with moderate 40% stenosis along the proximal and mid-LAD. Filling pressure were significantly elevated and he was noted to have severe pulmonary HTN, therefore he underwent further IV diuresis and was placed on Lasix 40mg  BID at the time of discharge. He has not followed-up with [facility] since as he no-showed for his hospital follow-up appointment and CHF Clinic appointment.  He presented back to [facility] on 01/05/2017 for evaluation of chest pain and a syncopal event. He reports having episodes of dyspnea at rest along with exertion for the past 2 months. Has experienced orthopnea and edema. Also notes chest pain which usually lasts throughout the day and is not worsened by exertion. He has been weighing himself but reports weights have trended down, although this is in the setting of him having a decreased appetite and consuming less food. Reports only taking his Lasix once a day and missing doses regularly.    Yesterday, he went to the [facility] and upon returning home, he developed acute dizziness and had a syncopal event while sitting on the edge of the bed. Lost consciousness for a few seconds. No seizure-like activity or post-ictal symptoms reported. Reports his wife "caught him" and he did not sustain any injuries. No associated palpitations or chest pain at that time.   Initial labs show WBC of 5.2, Hgb 12.7, platelets 179. K+ 3.5, creatinine 0.97. BNP 1742. Initial troponin 0.05 with repeat value of 0.06 (similar to prior values in 06/2016). CXR shows aortic atherosclerosis with stable cardiomegaly and central pulmonary vascular congestion. EKG shows NSR, HR 76, PAC's, and lateral TWI (similar to prior tracings).    Past Medical History   Past Medical History:  Diagnosis Date  . CAD in native artery    a. stress echo 12/2007 abnl, EF > 55%, b. LHC 01/28/08: mLAD 30, D1 40, dLCx 70, pRCA 30, mRCA 70, mRCA lesion 2 80, PDA 90, s/p PCI/BMS to prox and distal RCA, s/p PCI/BMS to PDA; c. patient reports PCI/stenting x 2 in early 2017 at the [place] (no records on file) d. 06/2016: cath showing patent stents along RCA and LCx with moderate 40% stenosis along the LAD.   [name] Chronic combined systolic (congestive) and diastolic (congestive) heart failure (HCC)    a. echo 2009: > 55% b. 06/2016: EF 25-30% by echo in 06/2016, cath showing patent stents with moderate disease along LAD  . COPD (chronic obstructive pulmonary disease) (HCC)   . Gout   . Hyperlipidemia   .  Hypertensive heart disease   . Obesity   . Tobacco abuse     Past Surgical History:  Procedure Laterality Date  . CARDIAC CATHETERIZATION  2009   [facility];   . CARDIAC CATHETERIZATION  2010  . CARDIAC CATHETERIZATION N/A 07/28/2016   Procedure: Right and Left Heart Cath and possible PCI;  Surgeon: Iran Ouch, MD;  Location: [facility] INVASIVE CV LAB;  Service: Cardiovascular;  Laterality: N/A;  . CORONARY ANGIOPLASTY  2009   s/p stent  placement at [facility].     Allergies  No Known Allergies  Inpatient Medications    . aspirin  81 mg Oral Daily  . atorvastatin  20 mg Oral Daily  . carvedilol  25 mg Oral BID WC  . docusate sodium  100 mg Oral BID  . furosemide  40 mg Intravenous BID  . heparin  5,000 Units Subcutaneous Q8H  . insulin aspart  0-9 Units Subcutaneous TID WC  . isosorbide mononitrate  60 mg Oral Daily  . metFORMIN  1,000 mg Oral Q breakfast  . mometasone-formoterol  2 puff Inhalation BID  . nicotine  7 mg Transdermal Q24H  . spironolactone  25 mg Oral Daily  . traZODone  50 mg Oral QHS    Family History    Family History  Problem Relation Age of Onset  . Heart attack Mother 30    Social History    Social History   Social History  . Marital status: Married    Spouse name: N/A  . Number of children: N/A  . Years of education: N/A   Occupational History  . Not on file.   Social History Main Topics  . Smoking status: Former Smoker    Packs/day: 1.00    Types: Cigarettes  . Smokeless tobacco: Never Used  . Alcohol use No  . Drug use: No  . Sexual activity: Not on file   Other Topics Concern  . Not on file   Social History Narrative  . No narrative on file     Review of Systems    General:  No chills, fever, night sweats or weight changes.  Cardiovascular:  No palpitations, paroxysmal nocturnal dyspnea. Positive for chest pain, edema, orthopnea, and dyspnea on exertion.  Dermatological: No rash, lesions/masses Respiratory: No cough, Positive for dyspnea Urologic: No hematuria, dysuria Abdominal:   No nausea, vomiting, diarrhea, bright red blood per rectum, melena, or hematemesis Neurologic:  No visual changes, wkns, changes in mental status. Positive for syncope.  All other systems reviewed and are otherwise negative except as noted above.  Physical Exam    Blood pressure (!) 156/98, pulse 78, temperature 97.7 F (36.5 C), temperature source Oral, resp. rate 18, height 5'  11" (1.803 m), weight 220 lb 11.2 oz (100.1 kg), SpO2 100 %.  General: Pleasant African American male appearing in NAD Psych: Normal affect. Neuro: Alert and oriented X 3. Moves all extremities spontaneously. HEENT: Normal  Neck: Supple without bruits. JVD at 9cm. Lungs:  Resp regular and unlabored, mild rales at bases bilaterally. Heart: RRR no s3, s4, or murmurs. Tender to palpation along left pectoral region.  Abdomen: Soft, non-tender, BS + x 4. Mildly distended.  Extremities: No clubbing or cyanosis. 1+ pitting edema up to mid-shins bilaterally. DP/PT/Radials 2+ and equal bilaterally.  Labs    Troponin (Point of Care Test) No results for input(s): TROPIPOC in the last 72 hours.  Recent Labs  01/05/17 1434 01/05/17 2308 01/06/17 0447  TROPONINI 0.05* 0.05* 0.06*  Lab Results  Component Value Date   WBC 5.6 01/06/2017   HGB 12.5 (L) 01/06/2017   HCT 39.1 (L) 01/06/2017   MCV 81.8 01/06/2017   PLT 167 01/06/2017     Recent Labs Lab 01/06/17 0447  NA 142  K 3.3*  CL 108  CO2 28  BUN 17  CREATININE 0.87  CALCIUM 8.9  GLUCOSE 115*   Lab Results  Component Value Date   CHOL 119 01/05/2017   HDL 36 (L) 01/05/2017   LDLCALC 64 01/05/2017   TRIG 93 01/05/2017   No results found for: [facility]   Radiology Studies    Dg Chest 2 View  Result Date: 01/05/2017 CLINICAL DATA:  Chest pain. EXAM: CHEST  2 VIEW COMPARISON:  Radiographs of July 23, 2016. FINDINGS: Stable cardiomegaly with central pulmonary vascular congestion. Atherosclerosis of thoracic aorta is noted. No pneumothorax or pleural effusion is noted. No consolidative process is noted. Bony thorax is unremarkable. IMPRESSION: Aortic atherosclerosis. Stable cardiomegaly with central pulmonary vascular congestion. Electronically Signed   By: Lupita RaiderJames  Green Jr, M.D.   On: 01/05/2017 15:09    EKG & Cardiac Imaging    EKG:  NSR, HR 76, PAC's, and lateral TWI (similar to prior tracings) - Personally  Reviewed  Echocardiogram: 07/24/2016 Study Conclusions  - Left ventricle: The cavity size was mildly dilated. There was   moderate concentric hypertrophy. Systolic function was severely   reduced. The estimated ejection fraction was in the range of 25%   to 30%. Diffuse hypokinesis. Features are consistent with a   pseudonormal left ventricular filling pattern, with concomitant   abnormal relaxation and increased filling pressure (grade 2   diastolic dysfunction). - Mitral valve: There was mild regurgitation. - Left atrium: The atrium was moderately dilated. - Right atrium: The atrium was moderately dilated. - Pulmonary arteries: Systolic pressure could not be accurately   estimated.  Cardiac Catheterization: 07/28/2016  Prox RCA lesion, 0 %stenosed.  Mid RCA lesion, 10 %stenosed.  Mid Cx lesion, 0 %stenosed.  Mid Cx to Dist Cx lesion, 0 %stenosed.  Prox Cx lesion, 40 %stenosed.  Prox LAD lesion, 40 %stenosed.  Mid LAD lesion, 40 %stenosed.  Dist LAD lesion, 30 %stenosed.  LV end diastolic pressure is severely elevated.   1. Patent stents in the right coronary artery and left circumflex with no significant restenosis. Moderate LAD disease.  2. Right heart catheterization showed severely elevated filling pressures and severe pulmonary hypertension. RV pressure was 82/18, PDA 81/43 with a mean of 57. RA pressure was 11. Left ventricular end-diastolic pressure was 34 mmHg. Pulmonary capillary wedge pressure could not be obtained due to inability to advance the Swan-Ganz catheter. Cardiac output is 4.44 with a cardiac index of 1.94.  Recommendations: Continue medical therapy for coronary artery disease and systolic heart failure. The patient continues to be severely volume overloaded and will need another few days of IV diuresis.  Assessment & Plan    1. Acute on Chronic Combined Systolic and Diastolic CHF/Ischemic Cardiomyopathy - EF previously > 55%, decreased to  25-30% by echo in 06/2016. Cath at that time showed patent stents with moderate LAD disease. Was noted to have severely elevated filling pressures and severe pulmonary hypertension.  - he reports having episodes of dyspnea at rest along with exertion for the past 2 months. Has experienced orthopnea and edema. Says weights have trended down but he has been consuming less food. Only takes Lasix once daily instead of BID. Skips doses at times.  -  BNP 1742. CXR shows aortic atherosclerosis with stable cardiomegaly and central pulmonary vascular congestion. He does have rales and pitting edema on examination.  - will increase IV Lasix from 20mg  Q8H to 40mg  BID. Repeat BMET in AM. Continue Coreg and Spironolactone. Consider addition of Losartan in the setting of his reduced EF and switch to [facility] as an outpatient.   2. Atypical Chest Pain - reports having chest pain for the past several months which can last for days at a time. Symptoms not always worsened with exertion.  - his pain is reproducible on examination.  - Initial troponin 0.05 with repeat value of 0.06 (similar to prior values in 06/2016). EKG shows NSR, HR 76, PAC's, and lateral TWI (similar to prior tracings).  - would not pursue further ischemic evaluation at this time. Consider increasing Imdur dosing if BP allows.   3. CAD  - s/p BMS to dRCA and BMS to PDA in 2009, reported stenting in 2017 at the [place] - no records on file for this. Cath in 06/2016 showed patent stents along the RCA and LCx with moderate 40% stenosis along the proximal and mid-LAD.  - continue ASA, BB, and statin therapy.   4. Syncopal Event - unknown etiology. Reports having acute dizziness and a syncopal event while sitting on the edge of the bed yesterday afternoon. Lost consciousness for a few seconds. No seizure-like activity or post-ictal symptoms reported.  - continue to monitor on telemetry while admitted. Check orthostatics later today.  - consider carotid  doppler studies this admission. If no abnormal events noted on telemetry this admission, would plan for event monitor at the time of discharge.   5. HTN - BP elevated to 156/98 this AM.  - continue Coreg 25mg  BID, Imdur 60mg  daily, and Spironolactone 25mg  daily. Consider addition of Losartan.   6. COPD - per admitting team.    Signed, Ellsworth Lennox, PA-C 01/06/2017, 8:47 AM Pager: [phone]

## 2017-01-06 NOTE — Care Management (Signed)
Placed in observation for chest pain.  [facility]  [facility] request was sent and [facility] diversion.   [facility]. Left message with Gaylyn RongLaurie Veasey.  Patient is concern abut his bill from this stay and previous stay 2 months ago.  Notified billing of concern.  Independent in all adls, denies issues accessing medical care, obtaining medications or with transportation.  Current with  PCP.  No discharge needs identified at present by care manager or members of care team

## 2017-01-06 NOTE — Progress Notes (Addendum)
[facility] [place] [facility]   PATIENT NAME: James CluckSamuel Harrington    MR#:  [id number]  DATE OF BIRTH:  [birth year]  SUBJECTIVE:  Came in with chest pain on and off with sob    REVIEW OF SYSTEMS:   Review of Systems  Constitutional: Negative for chills, fever and weight loss.  HENT: Negative for ear discharge, ear pain and nosebleeds.   Eyes: Negative for blurred vision, pain and discharge.  Respiratory: Positive for shortness of breath. Negative for sputum production, wheezing and stridor.   Cardiovascular: Positive for chest pain. Negative for palpitations, orthopnea and PND.  Gastrointestinal: Negative for abdominal pain, diarrhea, nausea and vomiting.  Genitourinary: Negative for frequency and urgency.  Musculoskeletal: Negative for back pain and joint pain.  Neurological: Negative for sensory change, speech change, focal weakness and weakness.  Psychiatric/Behavioral: Negative for depression and hallucinations. The patient is not nervous/anxious.    Tolerating Diet:yes Tolerating PT: not needed  DRUG ALLERGIES:  No Known Allergies  VITALS:  Blood pressure (!) 156/98, pulse 78, temperature 97.7 F (36.5 C), temperature source Oral, resp. rate 18, height 5\' 11"  (1.803 m), weight 100.1 kg (220 lb 11.2 oz), SpO2 100 %.  PHYSICAL EXAMINATION:   Physical Exam  GENERAL:  61 y.o.-year-old patient lying in the bed with no acute distress.  EYES: Pupils equal, round, reactive to light and accommodation. No scleral icterus. Extraocular muscles intact.  HEENT: Head atraumatic, normocephalic. Oropharynx and nasopharynx clear.  NECK:  Supple, no jugular venous distention. No thyroid enlargement, no tenderness.  LUNGS: Normal breath sounds bilaterally, no wheezing, rales, rhonchi. No use of accessory muscles of respiration.  CARDIOVASCULAR: S1, S2 normal. No murmurs, rubs, or gallops.  ABDOMEN: Soft, nontender, nondistended. Bowel sounds present. No  organomegaly or mass.  EXTREMITIES: No cyanosis, clubbing or edema b/l.    NEUROLOGIC: Cranial nerves II through XII are intact. No focal Motor or sensory deficits b/l.   PSYCHIATRIC:  patient is alert and oriented x 3.  SKIN: No obvious rash, lesion, or ulcer.   LABORATORY PANEL:  CBC  Recent Labs Lab 01/06/17 0447  WBC 5.6  HGB 12.5*  HCT 39.1*  PLT 167    Chemistries   Recent Labs Lab 01/06/17 0447  NA 142  K 3.3*  CL 108  CO2 28  GLUCOSE 115*  BUN 17  CREATININE 0.87  CALCIUM 8.9   Cardiac Enzymes  Recent Labs Lab 01/06/17 0447  TROPONINI 0.06*   RADIOLOGY:  Dg Chest 2 View  Result Date: 01/05/2017 CLINICAL DATA:  Chest pain. EXAM: CHEST  2 VIEW COMPARISON:  Radiographs of July 23, 2016. FINDINGS: Stable cardiomegaly with central pulmonary vascular congestion. Atherosclerosis of thoracic aorta is noted. No pneumothorax or pleural effusion is noted. No consolidative process is noted. Bony thorax is unremarkable. IMPRESSION: Aortic atherosclerosis. Stable cardiomegaly with central pulmonary vascular congestion. Electronically Signed   By: Lupita RaiderJames  Green Harrington, M.D.   On: 01/05/2017 15:09   ASSESSMENT AND PLAN:  James CluckSamuel Musquiz  is a 61 y.o. male with a known history of Coronary artery disease, chronic diastolic CHF, COPD, gout, hyperlipidemia, hypertensive heart disease, smoking, obesity- [facility]. For last few weeks he has on and off episodes of central chest pain with activities and it resolves with using nitroglycerin tablets  * Acute on chronic combined systolic and diastolic CHF   Give IV Lasix 40 mg twice a day---change to torsemide on discharge UOP ~2.75 liters. sats 99% on RA -.  Patient has had EF of 35-30% by echo in 2018. He reports having increasing shortness of breath. -now added losartan  * Atypical Chest pain likely anginal, history of coronary artery disease. - Patient had cardiac catheterization done in January 2018 showing less than  50% blockages but severely elevated left atrial pressure. -Seen by cardiology recommends continue current cardiac meds. No further ischemic evaluation at this time -Patient was started on IV heparin drip last night due to chest pain. Troponin was 0.11. No EKG changes. Dr. Mariah Milling recommends to stop Heparin drip.  * Hypertension   Continue home medications.  * Diabetes   Continue home medication and keep on sliding scale coverage.  * COPD    no acute exacerbation, continue his inhalers. -Patient states he has stopped smoking however wife tells patient still continues to smoke on a daily basis..  Case discussed with Care Management/Social Worker. Management plans discussed with the patient, family and they are in agreement.  CODE STATUS: Full  DVT Prophylaxis: Lovenox  TOTAL TIME TAKING CARE OF THIS PATIENT: 30  minutes.  >50% time spent on counselling and coordination of care  POSSIBLE D/C IN 1-2S, DEPENDING ON CLINICAL CONDITION.  Note: This dictation was prepared with Dragon dictation along with smaller phrase technology. Any transcriptional errors that result from this process are unintentional.  Almira Phetteplace M.D on 01/06/2017 at 3:24 PM  Between 7am to 6pm - Pager - [phone]  After 6pm go to [email or web address] - Social research officer, government  [facility]  Office  [phone]  CC: Primary care physician; [facility]

## 2017-01-06 NOTE — Progress Notes (Signed)
MD WAS MADE AWARE OF PT HAVING 25 BEAT OF V -TACH , NO NEW ORDER CONTINUE TO MONITOR

## 2017-01-06 NOTE — Progress Notes (Signed)
MD made aware of AM potassium of 3.3 and troponin of 0.06. Patient asymptomatic. No new orders at this time. Will continue to monitor.   Mayra NeerNesbitt, Nikelle Malatesta M

## 2017-01-07 ENCOUNTER — Other Ambulatory Visit: Payer: Self-pay

## 2017-01-07 LAB — CBC
HEMATOCRIT: 40 % (ref 40.0–52.0)
HEMOGLOBIN: 12.5 g/dL — AB (ref 13.0–18.0)
MCH: 25 pg — AB (ref 26.0–34.0)
MCHC: 31.2 g/dL — AB (ref 32.0–36.0)
MCV: 80.2 fL (ref 80.0–100.0)
Platelets: 160 10*3/uL (ref 150–440)
RBC: 4.99 MIL/uL (ref 4.40–5.90)
RDW: 20.6 % — ABNORMAL HIGH (ref 11.5–14.5)
WBC: 5.4 10*3/uL (ref 3.8–10.6)

## 2017-01-07 LAB — BASIC METABOLIC PANEL
ANION GAP: 8 (ref 5–15)
BUN: 23 mg/dL — AB (ref 6–20)
CHLORIDE: 102 mmol/L (ref 101–111)
CO2: 29 mmol/L (ref 22–32)
Calcium: 9.4 mg/dL (ref 8.9–10.3)
Creatinine, Ser: 0.99 mg/dL (ref 0.61–1.24)
GFR calc Af Amer: >60 mL/min (ref >60)
GFR calc non Af Amer: >60 mL/min (ref >60)
GLUCOSE: 134 mg/dL — AB (ref 65–99)
POTASSIUM: 3.8 mmol/L (ref 3.5–5.1)
Sodium: 139 mmol/L (ref 135–145)

## 2017-01-07 LAB — GLUCOSE, CAPILLARY: Glucose-Capillary: 112 mg/dL — ABNORMAL HIGH (ref 65–99)

## 2017-01-07 LAB — PROTIME-INR
INR: 1.19
Prothrombin Time: 15.2 seconds (ref 11.4–15.2)

## 2017-01-07 LAB — TROPONIN I
TROPONIN I: 0.12 ng/mL — AB (ref <0.03)
TROPONIN I: 0.11 ng/mL — AB (ref <0.03)

## 2017-01-07 LAB — HEPARIN LEVEL (UNFRACTIONATED): Heparin Unfractionated: 0.51 IU/mL (ref 0.30–0.70)

## 2017-01-07 LAB — HEMOGLOBIN A1C
HEMOGLOBIN A1C: 7.3 % — AB (ref 4.8–5.6)
Mean Plasma Glucose: 163 mg/dL

## 2017-01-07 LAB — MAGNESIUM: MAGNESIUM: 1.9 mg/dL (ref 1.7–2.4)

## 2017-01-07 LAB — HIV ANTIBODY (ROUTINE TESTING W REFLEX): HIV SCREEN 4TH GENERATION: NONREACTIVE

## 2017-01-07 LAB — APTT: aPTT: 35 seconds (ref 24–36)

## 2017-01-07 MED ORDER — LOSARTAN POTASSIUM 25 MG PO TABS: 25.0000 mg | ORAL_TABLET | Freq: Every day | ORAL | 1 refills | Status: DC

## 2017-01-07 MED ORDER — TORSEMIDE 20 MG PO TABS: 40.0000 mg | ORAL_TABLET | Freq: Every day | ORAL | 0 refills | Status: DC

## 2017-01-07 MED ORDER — NITROGLYCERIN 2 % TD OINT
0.5000 [in_us] | TOPICAL_OINTMENT | Freq: Four times a day (QID) | TRANSDERMAL | Status: DC
  Administered 2017-01-07: 0.5 [in_us] via TOPICAL
  Filled 2017-01-07: qty 1

## 2017-01-07 MED ORDER — POTASSIUM CHLORIDE CRYS ER 20 MEQ PO TBCR
40.0000 meq | EXTENDED_RELEASE_TABLET | Freq: Once | ORAL | Status: AC
  Administered 2017-01-07: 40 meq via ORAL
  Filled 2017-01-07: qty 2

## 2017-01-07 MED ORDER — HEPARIN (PORCINE) IN NACL 100-0.45 UNIT/ML-% IJ SOLN
1300.0000 [IU]/h | INTRAMUSCULAR | Status: DC
  Administered 2017-01-07: 1300 [IU]/h via INTRAVENOUS
  Filled 2017-01-07: qty 250

## 2017-01-07 MED ORDER — LOSARTAN POTASSIUM 25 MG PO TABS
25.0000 mg | ORAL_TABLET | Freq: Every day | ORAL | Status: DC
  Administered 2017-01-07: 25 mg via ORAL
  Filled 2017-01-07: qty 1

## 2017-01-07 MED ORDER — MORPHINE SULFATE (PF) 2 MG/ML IV SOLN
2.0000 mg | INTRAVENOUS | Status: DC | PRN
  Administered 2017-01-07: 2 mg via INTRAVENOUS
  Filled 2017-01-07: qty 1

## 2017-01-07 MED ORDER — MORPHINE SULFATE (PF) 4 MG/ML IV SOLN
4.0000 mg | Freq: Once | INTRAVENOUS | Status: DC
  Filled 2017-01-07: qty 1

## 2017-01-07 MED ORDER — HEPARIN BOLUS VIA INFUSION
2000.0000 [IU] | Freq: Once | INTRAVENOUS | Status: AC
  Administered 2017-01-07: 2000 [IU] via INTRAVENOUS
  Filled 2017-01-07: qty 2000

## 2017-01-07 NOTE — Progress Notes (Signed)
Patient ID: [name], male   DOB: [birth year], 61 y.o.   MRN: [id number]  Called by nursing, patient experiencing persistent chest pain and now with elevated troponin from 0.06-->0.12 EKG unchanged.  Already on aspirin, statin, beta blocker.  Will add additional dose of morphine and trial of nitropaste.  Monitor closely.

## 2017-01-07 NOTE — Care Management Important Message (Signed)
Important Message  Patient Details  Name: Jonell CluckSamuel Blanchet MRN: [id number] Date of Birth: Nov 09, 1955   Medicare Important Message Given:  Yes  CM click wrong choice on initial document.  Patient provided with Signed IM notice. Explained this notice and his rights in detail as he verbalized some concerns regarding discharge due to some symptoms he had over night.  Declines multiple time to appeal his discharge     Eber HongGreene, Cleopatra Sardo R, RN 01/07/2017, 11:39 AM

## 2017-01-07 NOTE — Care Management Important Message (Addendum)
Important Message  Patient Details  Name: Jonell CluckSamuel Steffler MRN: [id number] Date of Birth: September 09, 1955   Medicare Important Message Given:  N/A - LOS <3 / Initial given by admissions    Eber HongGreene, Victorious Cosio R, RN 01/07/2017, 9:34 AM

## 2017-01-07 NOTE — Care Management (Signed)
Patient initially declining home health nurse, but then agrees.   Asked unit secretary to actually make the appointment for patient to follow up with Dr Verta EllenKroner in one week.

## 2017-01-07 NOTE — Progress Notes (Signed)
Patient is discharge home in a stable condition, summary and f/u care given ,verbalized understanding , left with wife  

## 2017-01-07 NOTE — Discharge Summary (Signed)
[facility] - [place] [facility]   PATIENT NAME: [name]    MR#:  [id number]  DATE OF BIRTH:  01-16-1956  DATE OF ADMISSION:  01/05/2017 ADMITTING PHYSICIAN: Altamese DillingVaibhavkumar Vachhani, MD  DATE OF DISCHARGE: 01/07/17  PRIMARY CARE PHYSICIAN: Charolett BumpersKroner, George K, PA-C    ADMISSION DIAGNOSIS:  NSTEMI (non-ST elevated myocardial infarction) (HCC) [I21.4] Hypertensive urgency [I16.0] Syncope, unspecified syncope type [R55] Acute on chronic congestive heart failure, unspecified heart failure type (HCC) [I50.9]  DISCHARGE DIAGNOSIS:  Acute on chornic systolic/diastolic CHF Chronic angina with h/o CAD (last cath in jan 2018--medical mnx) Ongoing toabcco abuse COPD SECONDARY DIAGNOSIS:   Past Medical History:  Diagnosis Date  . CAD in native artery    a. stress echo 12/2007 abnl, EF > 55%, b. LHC 01/28/08: mLAD 30, D1 40, dLCx 70, pRCA 30, mRCA 70, mRCA lesion 2 80, PDA 90, s/p PCI/BMS to prox and distal RCA, s/p PCI/BMS to PDA; c. patient reports [facility] x 2 [facility] early [facility] (no records on file) d. 06/2016: cath showing patent stents along RCA and LCx with moderate 40% stenosis along the LAD.   [name]. Chronic combined systolic (congestive) and diastolic (congestive) heart failure (HCC)    a. echo 2009: > 55% b. 06/2016: EF 25-30% by echo in 06/2016, cath showing patent stents with moderate disease along LAD  . COPD (chronic obstructive pulmonary disease) (HCC)   . Gout   . Hyperlipidemia   . Hypertensive heart disease   . Obesity   . Tobacco abuse     HOSPITAL COURSE:  SamuelNelsonis a 61 y.o.malewith a known history of Coronary artery disease, chronic diastolic CHF, COPD, gout, hyperlipidemia, hypertensive heart disease, smoking, obesity- [facility]. For last few weeks he has on and off episodes of central chest pain with activities and it resolves with using nitroglycerin tablets  * Acute on chronic combined systolic and  diastolic CHF Give IV Lasix 40 mg twice a day---change to torsemide on discharge 40 mg daily UOP ~2.75 liters. sats 99% on RA -. Patient has had EF of 35-30% by echo in 2018. He reports having increasing shortness of breath. -now added losartan  * Atypical Chest pain likely anginal, history of coronary artery disease. -Patient had cardiac catheterization done in January 2018 showing less than 50% blockages but severely elevated left atrial pressure. -Seen by cardiology recommends continue current cardiac meds. No further ischemic evaluation at this time -Patient was started on IV heparin drip last night due to chest pain. Troponin was 0.11. No EKG changes. Dr. Mariah MillingGollan recommends to stop Heparin drip.  * Hypertension Continue home medications.  * Diabetes Continue home medication and keep on sliding scale coverage.  * COPD  no acute exacerbation, continue his inhalers. -Patient states he has stopped smoking however wife tells patient still continues to smoke on a daily basis..  [facility]  Spoke with wife and updated CONSULTS OBTAINED:  Treatment Team:  Antonieta IbaGollan, Timothy J, MD  DRUG ALLERGIES:  No Known Allergies  DISCHARGE MEDICATIONS:   Current Discharge Medication List    START taking these medications   Details  losartan (COZAAR) 25 MG tablet Take 1 tablet (25 mg total) by mouth daily. Qty: 30 tablet, Refills: 1    torsemide (DEMADEX) 20 MG tablet Take 2 tablets (40 mg total) by mouth daily. Qty: 60 tablet, Refills: 0      CONTINUE these medications which have NOT CHANGED  Details  albuterol-ipratropium (COMBIVENT) 18-103 MCG/ACT inhaler Inhale 2 puffs into the lungs every 4 (four) hours as needed for wheezing.    aspirin 81 MG tablet Take 81 mg by mouth daily.    atorvastatin (LIPITOR) 40 MG tablet Take 20 mg by mouth daily.    budesonide-formoterol (SYMBICORT) 160-4.5 MCG/ACT inhaler Inhale 2 puffs into the lungs 2 (two)  times daily.    carvedilol (COREG) 25 MG tablet Take 1 tablet (25 mg total) by mouth 2 (two) times daily with a meal. Qty: 60 tablet, Refills: 0    isosorbide mononitrate (IMDUR) 60 MG 24 hr tablet Take 60 mg by mouth daily.    metFORMIN (GLUCOPHAGE) 1000 MG tablet Take 1,000 mg by mouth daily with breakfast.     nicotine (NICODERM CQ - DOSED IN MG/24 HR) 7 mg/24hr patch Place 1 patch onto the skin daily.    spironolactone (ALDACTONE) 25 MG tablet Take 1 tablet (25 mg total) by mouth daily. Qty: 30 tablet, Refills: 0    traZODone (DESYREL) 50 MG tablet Take 50 mg by mouth at bedtime.    docusate sodium (COLACE) 100 MG capsule Take 100 mg by mouth 2 (two) times daily.    isosorbide-hydrALAZINE (BIDIL) 20-37.5 MG tablet Take 1 tablet by mouth 3 (three) times daily. Qty: 30 tablet, Refills: 0    nitroGLYCERIN (NITROSTAT) 0.4 MG SL tablet Place 0.4 mg under the tongue every 5 (five) minutes as needed for chest pain.    ondansetron (ZOFRAN) 4 MG tablet Take 4 mg by mouth every 4 (four) hours as needed for nausea.      STOP taking these medications     furosemide (LASIX) 40 MG tablet         If you experience worsening of your admission symptoms, develop shortness of breath, life threatening emergency, suicidal or homicidal thoughts you must seek medical attention immediately by calling 911 or calling your MD immediately  if symptoms less severe.  You Must read complete instructions/literature along with all the possible adverse reactions/side effects for all the Medicines you take and that have been prescribed to you. Take any new Medicines after you have completely understood and accept all the possible adverse reactions/side effects.   Please note  You were cared for by a hospitalist during your hospital stay. If you have any questions about your discharge medications or the care you received while you were in the hospital after you are discharged, you can call the unit and asked  to speak with the hospitalist on call if the hospitalist that took care of you is not available. Once you are discharged, your primary care physician will handle any further medical issues. Please note that NO REFILLS for any discharge medications will be authorized once you are discharged, as it is imperative that you return to your primary care physician (or establish a relationship with a primary care physician if you do not have one) for your aftercare needs so that they can reassess your need for medications and monitor your lab values. Today   SUBJECTIVE   Had some cp last nite. Non this am  VITAL SIGNS:  Blood pressure (!) 153/83, pulse 66, temperature 97.8 F (36.6 C), temperature source Oral, resp. rate 18, height 5\' 11"  (1.803 m), weight 99 kg (218 lb 3.2 oz), SpO2 98 %.  I/O:   Intake/Output Summary (Last 24 hours) at 01/07/17 1045 Last data filed at 01/07/17 0946  Gross per 24 hour  Intake  1164.07 ml  Output             2100 ml  Net          -935.93 ml    PHYSICAL EXAMINATION:  GENERAL:  61 y.o.-year-old patient lying in the bed with no acute distress.  EYES: Pupils equal, round, reactive to light and accommodation. No scleral icterus. Extraocular muscles intact.  HEENT: Head atraumatic, normocephalic. Oropharynx and nasopharynx clear.  NECK:  Supple, no jugular venous distention. No thyroid enlargement, no tenderness.  LUNGS: Normal breath sounds bilaterally, no wheezing, rales,rhonchi or crepitation. No use of accessory muscles of respiration.  CARDIOVASCULAR: S1, S2 normal. No murmurs, rubs, or gallops.  ABDOMEN: Soft, non-tender, non-distended. Bowel sounds present. No organomegaly or mass.  EXTREMITIES: No pedal edema, cyanosis, or clubbing.  NEUROLOGIC: Cranial nerves II through XII are intact. Muscle strength 5/5 in all extremities. Sensation intact. Gait not checked.  PSYCHIATRIC: The patient is alert and oriented x 3.  SKIN: No obvious rash, lesion, or  ulcer.   DATA REVIEW:   CBC   Recent Labs Lab 01/07/17 0856  WBC 5.4  HGB 12.5*  HCT 40.0  PLT 160    Chemistries   Recent Labs Lab 01/07/17 0243 01/07/17 0856  NA  --  139  K  --  3.8  CL  --  102  CO2  --  29  GLUCOSE  --  134*  BUN  --  23*  CREATININE  --  0.99  CALCIUM  --  9.4  MG 1.9  --     Microbiology Results   No results found for this or any previous visit (from the past 240 hour(s)).  RADIOLOGY:  Dg Chest 2 View  Result Date: 01/05/2017 CLINICAL DATA:  Chest pain. EXAM: CHEST  2 VIEW COMPARISON:  Radiographs of July 23, 2016. FINDINGS: Stable cardiomegaly with central pulmonary vascular congestion. Atherosclerosis of thoracic aorta is noted. No pneumothorax or pleural effusion is noted. No consolidative process is noted. Bony thorax is unremarkable. IMPRESSION: Aortic atherosclerosis. Stable cardiomegaly with central pulmonary vascular congestion. Electronically Signed   By: Lupita Raider, M.D.   On: 01/05/2017 15:09     Management plans discussed with the patient, family and they are in agreement.  CODE STATUS:     Code Status Orders        Start     Ordered   01/05/17 2247  Full code  Continuous     01/05/17 2246    Code Status History    Date Active Date Inactive Code Status Order ID Comments User Context   07/24/2016  2:35 AM 07/31/2016  2:01 PM Full Code [id number]  Oralia Manis, MD Inpatient    Advance Directive Documentation     Most Recent Value  Type of Advance Directive  Healthcare Power of Attorney  Pre-existing out of facility DNR order (yellow form or pink MOST form)  -  "MOST" Form in Place?  -      TOTAL TIME TAKING CARE OF THIS PATIENT: *40* minutes.    Zed Wanninger M.D on 01/07/2017 at 10:45 AM  Between 7am to 6pm - Pager - [phone] After 6pm go to [email or web address] - Social research officer, government  [facility]  Office  [phone]  CC: Primary care physician; Charolett Bumpers, PA-C

## 2017-01-07 NOTE — Progress Notes (Addendum)
CCMD reports 5 beat run v tach. Pt continues to report 10/10 chest pain mid sternal with no relief. MD Hugelmeyer made aware. Nitropaste 0.5 inch applied, 4 mg morphine IV ordered. Will continue to monitor.

## 2017-01-07 NOTE — Progress Notes (Signed)
Pt reports midsternal chest pain, non radiating, that he describes as stabbing 10/10. 3 x SL nitro given, 2mg  iv morphine given with no relief. MD Pyreddy made aware. 12 lead EKG and troponin ordered. Will continue to monitor.

## 2017-01-07 NOTE — Progress Notes (Signed)
Progress Note  Patient Name: James CluckSamuel Lobue Date of Encounter: 01/07/2017  Primary Cardiologist: [facility]  Subjective   Reports episodes of nausea and chest pain overnight. No palpitations.   Inpatient Medications    Scheduled Meds: . aspirin  81 mg Oral Daily  . atorvastatin  20 mg Oral Daily  . carvedilol  25 mg Oral BID WC  . docusate sodium  100 mg Oral BID  . furosemide  40 mg Intravenous BID  . insulin aspart  0-9 Units Subcutaneous TID WC  . isosorbide mononitrate  60 mg Oral Daily  . metFORMIN  1,000 mg Oral Q breakfast  . mometasone-formoterol  2 puff Inhalation BID  .  morphine injection  4 mg Intravenous Once  . nicotine  7 mg Transdermal Q24H  . nitroGLYCERIN  0.5 inch Topical Q6H  . protein supplement shake  11 oz Oral BID BM  . spironolactone  25 mg Oral Daily  . traZODone  50 mg Oral QHS   Continuous Infusions: . heparin 1,300 Units/hr (01/07/17 0318)   PRN Meds: acetaminophen, albuterol, docusate sodium, morphine injection, nitroGLYCERIN   Vital Signs    Vitals:   01/07/17 0059 01/07/17 0107 01/07/17 0209 01/07/17 0520  BP: (!) 148/94 (!) 152/99 (!) 143/95 (!) 164/80  Pulse: 90 83 70 (!) 52  Resp:      Temp:    97.6 F (36.4 C)  TempSrc:    Oral  SpO2:    99%  Weight:    218 lb 3.2 oz (99 kg)  Height:        Intake/Output Summary (Last 24 hours) at 01/07/17 0754 Last data filed at 01/06/17 1939  Gross per 24 hour  Intake              960 ml  Output             2100 ml  Net            -1140 ml   Filed Weights   01/05/17 1429 01/06/17 0548 01/07/17 0520  Weight: 224 lb (101.6 kg) 220 lb 11.2 oz (100.1 kg) 218 lb 3.2 oz (99 kg)    Telemetry    NSR, HR in 50's - 70's. Episodes of NSVT (up to 25 beats at one point, recurrent episodes up to 5 beats) - Personally Reviewed  ECG    NSR, HR 78 with PAC's. Lateral TWI noted (similar to prior tracings) - Personally Reviewed  Physical Exam   General: Well developed, well nourished,  African American male appearing in no acute distress. Head: Normocephalic, atraumatic.  Neck: Supple without bruits, JVD at 8cm. Lungs:  Resp regular and unlabored, CTA without wheezing or rales. Heart: RRR, S1, S2, no S3, S4, or murmur; no rub. Abdomen: Soft, non-tender, non-distended with normoactive bowel sounds. No hepatomegaly. No rebound/guarding. No obvious abdominal masses. Extremities: No clubbing or cyanosis, trace lower extremity edema. Distal pedal pulses are 2+ bilaterally. Neuro: Alert and oriented X 3. Moves all extremities spontaneously. Psych: Normal affect.  Labs    Chemistry Recent Labs Lab 01/05/17 1434 01/06/17 0447  NA 140 142  K 3.5 3.3*  CL 107 108  CO2 24 28  GLUCOSE 127* 115*  BUN 19 17  CREATININE 0.97 0.87  CALCIUM 9.3 8.9  GFRNONAA >60 >60  GFRAA >60 >60  ANIONGAP 9 6     Hematology Recent Labs Lab 01/05/17 1434 01/06/17 0447  WBC 5.2 5.6  RBC 5.00 4.78  HGB 12.7* 12.5*  HCT 40.6 39.1*  MCV 81.2 81.8  MCH 25.5* 26.1  MCHC 31.4* 32.0  RDW 20.2* 20.3*  PLT 179 167    Cardiac Enzymes Recent Labs Lab 01/05/17 1434 01/05/17 2308 01/06/17 0447 01/07/17 0123  TROPONINI 0.05* 0.05* 0.06* 0.12*   No results for input(s): TROPIPOC in the last 168 hours.   BNP Recent Labs Lab 01/05/17 1434  BNP 1,742.0*     DDimer No results for input(s): DDIMER in the last 168 hours.   Radiology    Dg Chest 2 View  Result Date: 01/05/2017 CLINICAL DATA:  Chest pain. EXAM: CHEST  2 VIEW COMPARISON:  Radiographs of July 23, 2016. FINDINGS: Stable cardiomegaly with central pulmonary vascular congestion. Atherosclerosis of thoracic aorta is noted. No pneumothorax or pleural effusion is noted. No consolidative process is noted. Bony thorax is unremarkable. IMPRESSION: Aortic atherosclerosis. Stable cardiomegaly with central pulmonary vascular congestion. Electronically Signed   By: Lupita Raider, M.D.   On: 01/05/2017 15:09    Cardiac Studies     Echocardiogram: 06/2016 Study Conclusions  - Left ventricle: The cavity size was mildly dilated. There was   moderate concentric hypertrophy. Systolic function was severely   reduced. The estimated ejection fraction was in the range of 25%   to 30%. Diffuse hypokinesis. Features are consistent with a   pseudonormal left ventricular filling pattern, with concomitant   abnormal relaxation and increased filling pressure (grade 2   diastolic dysfunction). - Mitral valve: There was mild regurgitation. - Left atrium: The atrium was moderately dilated. - Right atrium: The atrium was moderately dilated. - Pulmonary arteries: Systolic pressure could not be accurately   estimated.  Cardiac Catheterization: 07/24/2016  Prox RCA lesion, 0 %stenosed.  Mid RCA lesion, 10 %stenosed.  Mid Cx lesion, 0 %stenosed.  Mid Cx to Dist Cx lesion, 0 %stenosed.  Prox Cx lesion, 40 %stenosed.  Prox LAD lesion, 40 %stenosed.  Mid LAD lesion, 40 %stenosed.  Dist LAD lesion, 30 %stenosed.  LV end diastolic pressure is severely elevated.   1. Patent stents in the right coronary artery and left circumflex with no significant restenosis. Moderate LAD disease.  2. Right heart catheterization showed severely elevated filling pressures and severe pulmonary hypertension. RV pressure was 82/18, PDA 81/43 with a mean of 57. RA pressure was 11. Left ventricular end-diastolic pressure was 34 mmHg. Pulmonary capillary wedge pressure could not be obtained due to inability to advance the Swan-Ganz catheter. Cardiac output is 4.44 with a cardiac index of 1.94.  Recommendations: Continue medical therapy for coronary artery disease and systolic heart failure. The patient continues to be severely volume overloaded and will need another few days of IV diuresis.  Patient Profile     61 y.o. male with PMH of CAD (s/p BMS to dRCA and BMS to PDA in 2009, reported stenting in 2017 at the [place] - no records on file, cath in  06/2016 showing patent stents), chronic combined systolic and diastolic CHF (EF 16-10% by echo in 06/2016), hypertensive heart disease, COPD, HTN, and HLD who presented to [facility] on 01/05/2017 for chest pain and dyspnea. Found to have elevated BNP of 1742.  Assessment & Plan    1. Acute on Chronic Combined Systolic and Diastolic CHF/Ischemic Cardiomyopathy - EF previously > 55%, decreased to 25-30% by echo in 06/2016. Cath at that time showed patent stents with moderate LAD disease. Was noted to have severely elevated filling pressures and severe pulmonary hypertension.  - he reports having episodes of dyspnea  at rest along with exertion for the past 2 months. Has experienced orthopnea and edema. Says weights have trended down but he has been consuming less food. Only takes Lasix once daily instead of BID. Skips doses at times.  - BNP 1742. CXR shows aortic atherosclerosis with stable cardiomegaly and central pulmonary vascular congestion. He does have rales and pitting edema on examination.  - currently on IV Lasix 40mg  BID. Output of -2.3L thus far. At time of discharge, can consider switching to Torsemide 40mg  daily.  - continue Coreg and Spironolactone. Will add Losartan to his regimen in the setting of reduced EF and elevated BP.   2. Atypical Chest Pain - reports having chest pain for the past several months which can last for days at a time. Symptoms not always worsened with exertion.  - his pain is reproducible on examination.  - Initial troponin 0.05 with repeat values of 0.06, 0.12, and 0.11. EKG shows NSR, HR 76, PAC's, and lateral TWI (similar to prior tracings).  - would not pursue further ischemic evaluation at this time. Consider increasing Imdur dosing if BP allows.   3. CAD  - s/p BMS to dRCA and BMS to PDA in 2009, reported stenting in 2017 at the [place] - no records on file for this. Cath in 06/2016 showed patent stents along the RCA and LCx with moderate 40% stenosis along the  proximal and mid-LAD.  - continue ASA, BB, and statin therapy.   4. Syncopal Event/ Episodes of NSVT - unknown etiology. Reports having acute dizziness and a syncopal event while sitting on the edge of the bed the day of admission. Lost consciousness for a few seconds. No seizure-like activity or post-ictal symptoms reported.  - noted to have episodes of what appears most consistent of NSVT on telemetry (up to 25 beats) but asymptomatic with this. Mg WNL. Will check K+ as this was low at 3.3 on 7/10 and he received multiple doses of IV Lasix.   5. HTN - BP elevated to 153/83 this AM.  - continue Coreg 25mg  BID, Imdur 60mg  daily, and Spironolactone 25mg  daily. Will add Losartan 25mg  daily to his regimen.   6. COPD - per admitting team.    Signed, Ellsworth Lennox , PA-C 7:54 AM 01/07/2017 Pager: [phone]   Attending Note Patient seen and examined, agree with detailed note above,  Patient presentation and plan discussed on rounds.   Significant urine output the past 24 hours Reports having some nausea, atypical chest pain Shortness of breath improving  On physical exam lungs are clear to auscultation, JVD down 10+, Heart sounds regular with no rales appreciated, abdomen soft nontender, no significant lower extremity edema He is on room air  Lab work reviewed showing normal BMP, CBC Troponin 0.11  --- Respiratory distress Possible component of Acute on chronic systolic CHF Poor medication compliance, poor diet, high fluid intake Long discussion with him concerning the need to take his diuretics daily Does not want follow-up with cardiology in [place], reports he has cardiologist through the [place] system No way to confirm this Would continue losartan, carvedilol, Aldactone, consider changing Lasix to torsemide 40 daily  ----CAD Cholesterol at goal. No symptoms concerning for ischemia,  No change in EKG, cardiac enzymes negative Previous cardiac catheterization  showing nonobstructive disease  ----Syncope Etiology unclear, no arrhythmia on telemetry Negative orthostatics  ----Atypical chest pain No further workup at this time See above  Case discussed with hospitalist Patient was seen twice on rounds Wife called  and reported that he needed more testing It was explained that he had recent cardiac catheterization and echocardiogram and did not need further testing, We recommended to her that he be compliant with his medications, salt and fluid intake, follow up with cardiology at the [facility] Greater than 50% was spent in counseling and coordination of care with patient Total encounter time 35 minutes or more   Signed: Dossie Arbour  M.D., Ph.D. [facility]

## 2017-01-07 NOTE — Care Management (Addendum)
Patient for discharge home today.  He is agreeable to home health nurse and no agency preference. Referral to [facility] since this agency follows patients that are [facility]. Agency will see patient 7/12. Relayed patient's compliance concerns and provided name of [facility] MD- Hessie KnowsFred Kroner.  Asked unit secretary to contact Dr Windell HummingbirdGollan's office to make Heart Failure Clinic referral in the absence of the clinic staff this week. Patient did not comply with appointment in January 2018 .  Discussed compliant concerns with patient. Confirmed all contact information

## 2017-01-07 NOTE — Discharge Instructions (Signed)
[facility] to [facility] and f/u as out pt in 1 week

## 2017-01-07 NOTE — Progress Notes (Signed)
ANTICOAGULATION CONSULT NOTE - Initial Consult  Pharmacy Consult for heparin drip Indication: chest pain/ACS  No Known Allergies  Patient Measurements: Height: 5\' 11"  (180.3 cm) Weight: 218 lb 3.2 oz (99 kg) IBW/kg (Calculated) : 75.3 Heparin Dosing Weight: 96 kg  Vital Signs: Temp: 97.8 F (36.6 C) (07/11 0810) Temp Source: Oral (07/11 0810) BP: 153/83 (07/11 0810) Pulse Rate: 66 (07/11 0810)  Labs:  Recent Labs  01/05/17 1434  01/06/17 0447 01/07/17 0123 01/07/17 0243 01/07/17 0719 01/07/17 0856  HGB 12.7*  --  12.5*  --   --   --  12.5*  HCT 40.6  --  39.1*  --   --   --  40.0  PLT 179  --  167  --   --   --  160  APTT  --   --   --   --  35  --   --   LABPROT  --   --   --   --  15.2  --   --   INR  --   --   --   --  1.19  --   --   HEPARINUNFRC  --   --   --   --   --   --  0.51  CREATININE 0.97  --  0.87  --   --   --  0.99  TROPONINI 0.05*  < > 0.06* 0.12*  --  0.11*  --   < > = values in this interval not displayed.  Estimated Creatinine Clearance: 95.2 mL/min (by C-G formula based on SCr of 0.99 mg/dL).   Medical History: Past Medical History:  Diagnosis Date  . CAD in native artery    a. stress echo 12/2007 abnl, EF > 55%, b. LHC 01/28/08: mLAD 30, D1 40, dLCx 70, pRCA 30, mRCA 70, mRCA lesion 2 80, PDA 90, s/p PCI/BMS to prox and distal RCA, s/p PCI/BMS to PDA; c. patient [place] [facility] [place] [facility] ([place] [facility]) d. 06/2016: cath showing patent stents along RCA and LCx with moderate 40% stenosis along the LAD.   [name]. Chronic combined systolic (congestive) and diastolic (congestive) heart failure (HCC)    a. echo 2009: > 55% b. 06/2016: EF 25-30% by echo in 06/2016, cath showing patent stents with moderate disease along LAD  . COPD (chronic obstructive pulmonary disease) (HCC)   . Gout   . Hyperlipidemia   . Hypertensive heart disease   . Obesity   . Tobacco abuse     Medications:  No anticoagulation in PTA  meds.  Assessment:  Goal of Therapy:  Heparin level 0.3-0.7 Monitor platelets by anticoagulation protocol: Yes   Plan:  Half bolus d/t patient receiving SQ heparin at 22:00. Initial rate of 1300 units/hr. First heparin level and CBC 6 hours after start of infusion.  7/11 0856 HL therapeutic x 1. Continue current rate. Will recheck HL in 6 hours.  Carola FrostNathan A Shekinah Pitones, Pharm.D., BCPS Clinical Pharmacist 01/07/2017,10:23 AM

## 2017-01-07 NOTE — Progress Notes (Signed)
ANTICOAGULATION CONSULT NOTE - Initial Consult  Pharmacy Consult for heparin drip Indication: chest pain/ACS  No Known Allergies  Patient Measurements: Height: 5\' 11"  (180.3 cm) Weight: 220 lb 11.2 oz (100.1 kg) IBW/kg (Calculated) : 75.3 Heparin Dosing Weight: 96 kg  Vital Signs: Temp: 97.5 F (36.4 C) (07/11 0046) Temp Source: Oral (07/11 0046) BP: 143/95 (07/11 0209) Pulse Rate: 70 (07/11 0209)  Labs:  Recent Labs  01/05/17 1434 01/05/17 2308 01/06/17 0447 01/07/17 0123 01/07/17 0243  HGB 12.7*  --  12.5*  --   --   HCT 40.6  --  39.1*  --   --   PLT 179  --  167  --   --   APTT  --   --   --   --  35  LABPROT  --   --   --   --  15.2  INR  --   --   --   --  1.19  CREATININE 0.97  --  0.87  --   --   TROPONINI 0.05* 0.05* 0.06* 0.12*  --     Estimated Creatinine Clearance: 108.8 mL/min (by C-G formula based on SCr of 0.87 mg/dL).   Medical History: Past Medical History:  Diagnosis Date  . CAD in native artery    a. stress echo 12/2007 abnl, EF > 55%, b. LHC 01/28/08: mLAD 30, D1 40, dLCx 70, pRCA 30, mRCA 70, mRCA lesion 2 80, PDA 90, s/p PCI/BMS to prox and distal RCA, s/p PCI/BMS to PDA; c. patient reports PCI/stenting x 2 in early 2017 at the [facility] (no records on file) d. 06/2016: cath showing patent stents along RCA and LCx with moderate 40% stenosis along the LAD.   [name]. Chronic combined systolic (congestive) and diastolic (congestive) heart failure (HCC)    a. echo 2009: > 55% b. 06/2016: EF 25-30% by echo in 06/2016, cath showing patent stents with moderate disease along LAD  . COPD (chronic obstructive pulmonary disease) (HCC)   . Gout   . Hyperlipidemia   . Hypertensive heart disease   . Obesity   . Tobacco abuse     Medications:  No anticoagulation in PTA meds.  Assessment:  Goal of Therapy:  Heparin level 0.3-0.7 Monitor platelets by anticoagulation protocol: Yes   Plan:  Half bolus d/t patient receiving SQ heparin at 22:00. Initial rate of  1300 units/hr. First heparin level and CBC 6 hours after start of infusion.  [name] S 01/07/2017,3:27 AM

## 2017-01-19 ENCOUNTER — Ambulatory Visit: Payer: Medicare Other | Admitting: Family

## 2017-01-28 DIAGNOSIS — I4819 Other persistent atrial fibrillation: Secondary | ICD-10-CM

## 2017-01-28 HISTORY — DX: Other persistent atrial fibrillation: I48.19

## 2017-02-18 ENCOUNTER — Inpatient Hospital Stay
Admission: EM | Admit: 2017-02-18 | Discharge: 2017-02-19 | DRG: 308 | Disposition: A | Discharge status: Home Health | Payer: Medicare Other | Attending: Internal Medicine | Admitting: Internal Medicine

## 2017-02-18 ENCOUNTER — Encounter: Payer: Self-pay | Admitting: Intensive Care

## 2017-02-18 ENCOUNTER — Emergency Department: Payer: Medicare Other

## 2017-02-18 DIAGNOSIS — IMO0002 Reserved for concepts with insufficient information to code with codable children: Secondary | ICD-10-CM

## 2017-02-18 DIAGNOSIS — I248 Other forms of acute ischemic heart disease: Secondary | ICD-10-CM | POA: Diagnosis present

## 2017-02-18 DIAGNOSIS — I4892 Unspecified atrial flutter: Secondary | ICD-10-CM | POA: Diagnosis present

## 2017-02-18 DIAGNOSIS — R000 Tachycardia, unspecified: Secondary | ICD-10-CM | POA: Diagnosis present

## 2017-02-18 DIAGNOSIS — Z7951 Long term (current) use of inhaled steroids: Secondary | ICD-10-CM

## 2017-02-18 DIAGNOSIS — Z7984 Long term (current) use of oral hypoglycemic drugs: Secondary | ICD-10-CM | POA: Diagnosis not present

## 2017-02-18 DIAGNOSIS — I255 Ischemic cardiomyopathy: Secondary | ICD-10-CM | POA: Diagnosis present

## 2017-02-18 DIAGNOSIS — I110 Hypertensive heart disease with heart failure: Secondary | ICD-10-CM | POA: Diagnosis present

## 2017-02-18 DIAGNOSIS — I4891 Unspecified atrial fibrillation: Secondary | ICD-10-CM | POA: Diagnosis present

## 2017-02-18 DIAGNOSIS — E119 Type 2 diabetes mellitus without complications: Secondary | ICD-10-CM | POA: Diagnosis present

## 2017-02-18 DIAGNOSIS — I5043 Acute on chronic combined systolic (congestive) and diastolic (congestive) heart failure: Secondary | ICD-10-CM | POA: Diagnosis present

## 2017-02-18 DIAGNOSIS — Z87891 Personal history of nicotine dependence: Secondary | ICD-10-CM

## 2017-02-18 DIAGNOSIS — Z8249 Family history of ischemic heart disease and other diseases of the circulatory system: Secondary | ICD-10-CM

## 2017-02-18 DIAGNOSIS — Z7982 Long term (current) use of aspirin: Secondary | ICD-10-CM | POA: Diagnosis not present

## 2017-02-18 DIAGNOSIS — I2510 Atherosclerotic heart disease of native coronary artery without angina pectoris: Secondary | ICD-10-CM | POA: Diagnosis present

## 2017-02-18 DIAGNOSIS — Z955 Presence of coronary angioplasty implant and graft: Secondary | ICD-10-CM | POA: Diagnosis not present

## 2017-02-18 DIAGNOSIS — E876 Hypokalemia: Secondary | ICD-10-CM | POA: Diagnosis present

## 2017-02-18 DIAGNOSIS — Z79899 Other long term (current) drug therapy: Secondary | ICD-10-CM | POA: Diagnosis not present

## 2017-02-18 DIAGNOSIS — I5033 Acute on chronic diastolic (congestive) heart failure: Secondary | ICD-10-CM

## 2017-02-18 DIAGNOSIS — J449 Chronic obstructive pulmonary disease, unspecified: Secondary | ICD-10-CM | POA: Diagnosis present

## 2017-02-18 DIAGNOSIS — E785 Hyperlipidemia, unspecified: Secondary | ICD-10-CM | POA: Diagnosis present

## 2017-02-18 LAB — TROPONIN I
Troponin I: 0.06 ng/mL (ref <0.03)
Troponin I: 0.05 ng/mL (ref <0.03)

## 2017-02-18 LAB — CBC
HEMATOCRIT: 40.1 % (ref 40.0–52.0)
HEMOGLOBIN: 13.2 g/dL (ref 13.0–18.0)
MCH: 26.3 pg (ref 26.0–34.0)
MCHC: 32.9 g/dL (ref 32.0–36.0)
MCV: 79.9 fL — ABNORMAL LOW (ref 80.0–100.0)
Platelets: 136 10*3/uL — ABNORMAL LOW (ref 150–440)
RBC: 5.01 MIL/uL (ref 4.40–5.90)
RDW: 20 % — ABNORMAL HIGH (ref 11.5–14.5)
WBC: 6.1 10*3/uL (ref 3.8–10.6)

## 2017-02-18 LAB — BASIC METABOLIC PANEL
ANION GAP: 9 (ref 5–15)
BUN: 19 mg/dL (ref 6–20)
CO2: 29 mmol/L (ref 22–32)
Calcium: 9.6 mg/dL (ref 8.9–10.3)
Chloride: 102 mmol/L (ref 101–111)
Creatinine, Ser: 1.2 mg/dL (ref 0.61–1.24)
GFR calc Af Amer: >60 mL/min (ref >60)
GFR calc non Af Amer: >60 mL/min (ref >60)
GLUCOSE: 115 mg/dL — AB (ref 65–99)
POTASSIUM: 3.8 mmol/L (ref 3.5–5.1)
Sodium: 140 mmol/L (ref 135–145)

## 2017-02-18 LAB — BRAIN NATRIURETIC PEPTIDE: B NATRIURETIC PEPTIDE 5: 329 pg/mL — AB (ref 0.0–100.0)

## 2017-02-18 LAB — PROTIME-INR
INR: 1.14
PROTHROMBIN TIME: 14.7 s (ref 11.4–15.2)

## 2017-02-18 LAB — GLUCOSE, CAPILLARY: Glucose-Capillary: 105 mg/dL — ABNORMAL HIGH (ref 65–99)

## 2017-02-18 LAB — TSH: TSH: 1.707 u[IU]/mL (ref 0.350–4.500)

## 2017-02-18 LAB — APTT: aPTT: 31 seconds (ref 24–36)

## 2017-02-18 MED ORDER — MOMETASONE FURO-FORMOTEROL FUM 200-5 MCG/ACT IN AERO
2.0000 | INHALATION_SPRAY | Freq: Two times a day (BID) | RESPIRATORY_TRACT | Status: DC
  Administered 2017-02-18 – 2017-02-19 (×2): 2 via RESPIRATORY_TRACT
  Filled 2017-02-18: qty 8.8

## 2017-02-18 MED ORDER — HEPARIN BOLUS VIA INFUSION
4500.0000 [IU] | Freq: Once | INTRAVENOUS | Status: AC
  Administered 2017-02-18: 4500 [IU] via INTRAVENOUS
  Filled 2017-02-18: qty 4500

## 2017-02-18 MED ORDER — ONDANSETRON HCL 4 MG/2ML IJ SOLN: 4.0000 mg | Freq: Four times a day (QID) | INTRAMUSCULAR | Status: DC | PRN

## 2017-02-18 MED ORDER — DILTIAZEM LOAD VIA INFUSION
15.0000 mg | Freq: Once | INTRAVENOUS | Status: AC
  Administered 2017-02-18: 15 mg via INTRAVENOUS
  Filled 2017-02-18: qty 15

## 2017-02-18 MED ORDER — NITROGLYCERIN 0.4 MG SL SUBL
0.4000 mg | SUBLINGUAL_TABLET | SUBLINGUAL | Status: DC | PRN
Start: 2017-02-18 — End: 2017-02-19
  Filled 2017-02-18: qty 1

## 2017-02-18 MED ORDER — ADENOSINE 6 MG/2ML IV SOLN
6.0000 mg | Freq: Once | INTRAVENOUS | Status: AC
  Administered 2017-02-18: 6 mg via INTRAVENOUS

## 2017-02-18 MED ORDER — HEPARIN (PORCINE) IN NACL 100-0.45 UNIT/ML-% IJ SOLN
1400.0000 [IU]/h | INTRAMUSCULAR | Status: DC
  Administered 2017-02-18: 1400 [IU]/h via INTRAVENOUS
  Filled 2017-02-18 (×2): qty 250

## 2017-02-18 MED ORDER — INSULIN ASPART 100 UNIT/ML ~~LOC~~ SOLN
0.0000 [IU] | Freq: Three times a day (TID) | SUBCUTANEOUS | Status: DC
  Administered 2017-02-19: 2 [IU] via SUBCUTANEOUS
  Filled 2017-02-18: qty 1

## 2017-02-18 MED ORDER — DILTIAZEM HCL 25 MG/5ML IV SOLN
10.0000 mg | Freq: Once | INTRAVENOUS | Status: AC
  Administered 2017-02-18: 10 mg via INTRAVENOUS
  Filled 2017-02-18 (×2): qty 5

## 2017-02-18 MED ORDER — ASPIRIN EC 81 MG PO TBEC: 81.0000 mg | DELAYED_RELEASE_TABLET | Freq: Every day | ORAL | Status: DC

## 2017-02-18 MED ORDER — ACETAMINOPHEN 325 MG PO TABS: 650.0000 mg | ORAL_TABLET | Freq: Four times a day (QID) | ORAL | Status: DC | PRN

## 2017-02-18 MED ORDER — ATORVASTATIN CALCIUM 20 MG PO TABS
20.0000 mg | ORAL_TABLET | Freq: Every day | ORAL | Status: DC
  Administered 2017-02-18 – 2017-02-19 (×2): 20 mg via ORAL
  Filled 2017-02-18 (×2): qty 1

## 2017-02-18 MED ORDER — CARVEDILOL 25 MG PO TABS
25.0000 mg | ORAL_TABLET | Freq: Two times a day (BID) | ORAL | Status: DC
  Administered 2017-02-19: 25 mg via ORAL
  Filled 2017-02-18: qty 1

## 2017-02-18 MED ORDER — DILTIAZEM HCL 100 MG IV SOLR
5.0000 mg/h | INTRAVENOUS | Status: DC
  Administered 2017-02-18: 5 mg/h via INTRAVENOUS
  Administered 2017-02-18 – 2017-02-19 (×3): 15 mg/h via INTRAVENOUS
  Filled 2017-02-18 (×4): qty 100

## 2017-02-18 MED ORDER — ONDANSETRON HCL 4 MG PO TABS: 4.0000 mg | ORAL_TABLET | Freq: Four times a day (QID) | ORAL | Status: DC | PRN

## 2017-02-18 MED ORDER — INSULIN ASPART 100 UNIT/ML ~~LOC~~ SOLN: 0.0000 [IU] | Freq: Every day | SUBCUTANEOUS | Status: DC

## 2017-02-18 MED ORDER — ACETAMINOPHEN 650 MG RE SUPP: 650.0000 mg | Freq: Four times a day (QID) | RECTAL | Status: DC | PRN

## 2017-02-18 MED ORDER — LOSARTAN POTASSIUM 25 MG PO TABS
25.0000 mg | ORAL_TABLET | Freq: Every day | ORAL | Status: DC
  Administered 2017-02-19: 25 mg via ORAL
  Filled 2017-02-18: qty 1

## 2017-02-18 MED ORDER — TRAZODONE HCL 50 MG PO TABS
50.0000 mg | ORAL_TABLET | Freq: Every day | ORAL | Status: DC
  Administered 2017-02-18: 50 mg via ORAL
  Filled 2017-02-18: qty 1

## 2017-02-18 MED ORDER — SPIRONOLACTONE 25 MG PO TABS
25.0000 mg | ORAL_TABLET | Freq: Every day | ORAL | Status: DC
  Filled 2017-02-18: qty 1

## 2017-02-18 MED ORDER — ADENOSINE 6 MG/2ML IV SOLN
INTRAVENOUS | Status: AC
  Administered 2017-02-18: 6 mg via INTRAVENOUS
  Filled 2017-02-18: qty 2

## 2017-02-18 MED ORDER — DOCUSATE SODIUM 100 MG PO CAPS
100.0000 mg | ORAL_CAPSULE | Freq: Two times a day (BID) | ORAL | Status: DC
  Administered 2017-02-18: 100 mg via ORAL
  Filled 2017-02-18: qty 1

## 2017-02-18 MED ORDER — ADENOSINE 12 MG/4ML IV SOLN
INTRAVENOUS | Status: AC
  Filled 2017-02-18: qty 4

## 2017-02-18 MED ORDER — TORSEMIDE 20 MG PO TABS
40.0000 mg | ORAL_TABLET | Freq: Every day | ORAL | Status: DC
Start: 2017-02-18 — End: 2017-02-19
  Administered 2017-02-18 – 2017-02-19 (×2): 40 mg via ORAL
  Filled 2017-02-18 (×2): qty 2

## 2017-02-18 NOTE — ED Triage Notes (Signed)
Patient reports his home health nurse got a HR of 146 and suspected fluid overload so she sent him to ER. Patient c/o chronic chest pressure and shortness of breath. HX A-fib. Not on any blood thinners are this time. Unlabored breathing in triage. No distress answering questions in triage

## 2017-02-18 NOTE — ED Provider Notes (Addendum)
[facility] Emergency Department Provider Note   ____________________________________________   First MD Initiated Contact with Patient 02/18/17 1830     (approximate)  I have reviewed the triage vital signs and the nursing notes.   HISTORY  Chief Complaint Shortness of Breath    HPI [name] is a 61 y.o. male reports several days of rapid heartbeat and shortness of breath. Was seen at and they told him he had a make an appointment to get on anticoagulation and the knee was start working on him. It was constant several days. Has home health nurse told him to come here. While sitti but is still tachycardic anywhere from 07/20/1947. Patient was given adenosine slowed his heart rate down enough where with see the flutter waves easily. He is in a flutter. When he exerts himself he says he has some shortness of breath. He has not had any chest pain   Past Medical History:  Diagnosis Date  . CAD in native artery    a. stress echo 12/2007 abnl, EF > 55%, b. LHC 01/28/08: mLAD 30, D1 40, dLCx 70, pRCA 30, mRCA 70, mRCA lesion 2 80, PDA 90, s/p PCI/BMS to prox and distal RCA, s/p PCI/BMS to PDA; c. patient reports PCI/stenting x 2 in early 2017 at the [place] (no records on file) d. 06/2016: cath showing patent stents along RCA and LCx with moderate 40% stenosis along the LAD.   [name] Chronic combined systolic (congestive) and diastolic (congestive) heart failure (HCC)    a. echo 2009: > 55% b. 06/2016: EF 25-30% by echo in 06/2016, cath showing patent stents with moderate disease along LAD  . COPD (chronic obstructive pulmonary disease) (HCC)   . Gout   . Hyperlipidemia   . Hypertensive heart disease   . Obesity   . Tobacco abuse     Patient Active Problem List   Diagnosis Date Noted  . Diastolic CHF, acute on chronic (HCC) 01/06/2017  . CAD in native artery   . Chest pain 01/05/2017  . Ischemic cardiomyopathy 07/26/2016  . Non-sustained ventricular tachycardia  (HCC) 07/26/2016  . COPD (chronic obstructive pulmonary disease) (HCC) 07/24/2016  . CAD (coronary artery disease) 07/24/2016  . HLD (hyperlipidemia) 07/24/2016  . Acute on chronic combined systolic and diastolic CHF (congestive heart failure) (HCC) 07/24/2016  . Acute respiratory distress 07/24/2016  . Tobacco abuse 07/24/2016  . Accelerated hypertension 07/24/2016  . Hypertensive heart disease 07/24/2016    Past Surgical History:  Procedure Laterality Date  . CARDIAC CATHETERIZATION  2009   [facility];   . CARDIAC CATHETERIZATION  2010  . CARDIAC CATHETERIZATION N/A 07/28/2016   Procedure: Right and Left Heart Cath and possible PCI;  Surgeon: Iran Ouch, MD;  Location: [facility] INVASIVE CV LAB;  Service: Cardiovascular;  Laterality: N/A;  . CORONARY ANGIOPLASTY  2009   s/p stent placement at [facility].    Prior to Admission medications   Medication Sig Start Date End Date Taking? Authorizing Provider  albuterol-ipratropium (COMBIVENT) 18-103 MCG/ACT inhaler Inhale 2 puffs into the lungs every 4 (four) hours as needed for wheezing.    [provider]  aspirin 81 MG tablet Take 81 mg by mouth daily.    [provider]  atorvastatin (LIPITOR) 40 MG tablet Take 20 mg by mouth daily.    [provider]  budesonide-formoterol (SYMBICORT) 160-4.5 MCG/ACT inhaler Inhale 2 puffs into the lungs 2 (two) times daily.    [provider]  carvedilol (COREG) 25 MG  tablet Take 1 tablet (25 mg total) by mouth 2 (two) times daily with a meal. 07/31/16   Enedina Finner, MD  docusate sodium (COLACE) 100 MG capsule Take 100 mg by mouth 2 (two) times daily.    [provider]  isosorbide mononitrate (IMDUR) 60 MG 24 hr tablet Take 60 mg by mouth daily.    [provider]  isosorbide-hydrALAZINE (BIDIL) 20-37.5 MG tablet Take 1 tablet by mouth 3 (three) times daily. Patient not taking: Reported on 01/05/2017 07/31/16   Enedina Finner, MD  losartan (COZAAR) 25 MG tablet  Take 1 tablet (25 mg total) by mouth daily. 01/08/17   Enedina Finner, MD  metFORMIN (GLUCOPHAGE) 1000 MG tablet Take 1,000 mg by mouth daily with breakfast.     [provider]  nicotine (NICODERM CQ - DOSED IN MG/24 HR) 7 mg/24hr patch Place 1 patch onto the skin daily.    [provider]  nitroGLYCERIN (NITROSTAT) 0.4 MG SL tablet Place 0.4 mg under the tongue every 5 (five) minutes as needed for chest pain.    [provider]  ondansetron (ZOFRAN) 4 MG tablet Take 4 mg by mouth every 4 (four) hours as needed for nausea.    [provider]  spironolactone (ALDACTONE) 25 MG tablet Take 1 tablet (25 mg total) by mouth daily. 08/01/16   Enedina Finner, MD  torsemide (DEMADEX) 20 MG tablet Take 2 tablets (40 mg total) by mouth daily. 01/07/17   Enedina Finner, MD  traZODone (DESYREL) 50 MG tablet Take 50 mg by mouth at bedtime.    [provider]    Allergies Patient has no known allergies.  Family History  Problem Relation Age of Onset  . Heart attack Mother 54    Social History Social History  Substance Use Topics  . Smoking status: Former Smoker    Packs/day: 1.00    Types: Cigarettes  . Smokeless tobacco: Never Used  . Alcohol use No    Review of Systems  Constitutional: No fever/chills Eyes: No visual changes. ENT: No sore throat. Cardiovascular: Denies chest pain. Respiratory: himshortness of breath. Gastrointestinal: No abdominal pain.  No nausea, no vomiting.  No diarrhea.  No constipation. Genitourinary: Negative for dysuria. Musculoskeletal: Negative for back pain. Skin: Negative for rash. Neurological: Negative for headaches, focal weakness  ____________________________________________   PHYSICAL EXAM:  VITAL SIGNS: ED Triage Vitals  Enc Vitals Group     BP --      Pulse --      Resp --      Temp 02/18/17 1816 98.5 F (36.9 C)     Temp Source 02/18/17 1816 Oral     SpO2 --      Weight 02/18/17 1816 211 lb (95.7 kg)      Height 02/18/17 1816 6' (1.829 m)     Head Circumference --      Peak Flow --      Pain Score 02/18/17 1815 7     Pain Loc --      Pain Edu? --      Excl. in GC? --     Constitutional: Alert and oriented. Well appearing and in no acute distress. Eyes: Conjunctivae are normal.  Head: Atraumatic. Nose: No congestion/rhinnorhea. Mouth/Throat: Mucous membranes are moist.  Oropharynx non-erythematous. Neck: No stridor.  Cardiovascular: appendectomy.l rate, regular rhythm. Grossly normal heart sounds.  Good peripheral circulation. Respiratory: Normal respiratory effort.  No retractions. Lungs CTAB. Gastrointestinal: Soft and nontender. No distention. No abdominal bruits. No  CVA tenderness. Musculoskeletal: No lower extremity tenderness nor edema.  No joint effusions. Neurologic:  Normal speech and language. No gross focal neurologic deficits are appreciated. No gait instability. Skin:  Skin is warm, dry and intact. No rash noted. Psychiatric: Mood and affect are normal. Speech and behavior are normal.  ____________________________________________   LABS (all labs ordered are listed, but only abnormal results are displayed)  Labs Reviewed  BASIC METABOLIC PANEL - Abnormal; Notable for the following:       Result Value   Glucose, Bld 115 (*)    All other components within normal limits  CBC - Abnormal; Notable for the following:    MCV 79.9 (*)    RDW 20.0 (*)    Platelets 136 (*)    All other components within normal limits  TROPONIN I - Abnormal; Notable for the following:    Troponin I 0.06 (*)    All other components within normal limits  BRAIN NATRIURETIC PEPTIDE  TSH   ____________________________________________  EKG  EKG read and interpreted by me shows probable atrial flutter at a rate of 146 left axis rate related changes no STEM ____________________________________________  RADIOLOGY  IMPRESSION: Cardiomegaly without acute  disease.  Atherosclerosis.   Electronically Signed   By: Drusilla Kanner M.D.   On: 02/18/2017 18:54 ____________________________________________   PROCEDURES  Procedure(s) performed:   Procedures  Critical Care performed:  ____________________________________________   INITIAL IMPRESSION / ASSESSMENT AND PLAN / ED COURSE  Pertinent labs & imaging results that were available during my care of the patient were reviewed by me and considered in my medical decision making (see chart for details).  Patient started on diltiazem drip was diltiazem bolus did not touch him.    atient reports he does not want to go to the [facility] asked him twice.  ____________________________________________   FINAL CLINICAL IMPRESSION(S) / ED DIAGNOSES  Final diagnoses:  Flutter-fibrillation  Atrial fibrillation with RVR (HCC)      NEW MEDICATIONS STARTED DURING THIS VISIT:  New Prescriptions   No medications on file     Note:  This document was prepared using Dragon voice recognition software and may include unintentional dictation errors.    Arnaldo Natal, MD 02/18/17 Mila Merry    Arnaldo Natal, MD 02/18/17 Corky Crafts

## 2017-02-18 NOTE — ED Notes (Signed)
First nurse note  Family states he was sent in d/t rapid heart rate  Sob and possible fluid overload

## 2017-02-18 NOTE — Progress Notes (Signed)
ANTICOAGULATION CONSULT NOTE - Initial Consult  Pharmacy Consult for heparin drip Indication: atrial fibrillation  No Known Allergies  Patient Measurements: Height: 6' (182.9 cm) Weight: 211 lb (95.7 kg) IBW/kg (Calculated) : 77.6 Heparin Dosing Weight: 95 kg  Vital Signs: Temp: 98.5 F (36.9 C) (08/22 1816) Temp Source: Oral (08/22 1816) BP: 156/83 (08/22 1900) Pulse Rate: 75 (08/22 1900)  Labs:  Recent Labs  02/18/17 1816  HGB 13.2  HCT 40.1  PLT 136*  CREATININE 1.20  TROPONINI 0.06*    Estimated Creatinine Clearance: 78.5 mL/min (by C-G formula based on SCr of 1.2 mg/dL).   Medical History: Past Medical History:  Diagnosis Date  . CAD in native artery    a. stress echo 12/2007 abnl, EF > 55%, b. LHC 01/28/08: mLAD 30, D1 40, dLCx 70, pRCA 30, mRCA 70, mRCA lesion 2 80, PDA 90, s/p PCI/BMS to prox and distal RCA, s/p PCI/BMS to PDA; c. patient reports PCI/stenting x 2 in early 2017 at the [place] (no records on file) d. 06/2016: cath showing patent stents along RCA and LCx with moderate 40% stenosis along the LAD.   [name] Chronic combined systolic (congestive) and diastolic (congestive) heart failure (HCC)    a. echo 2009: > 55% b. 06/2016: EF 25-30% by echo in 06/2016, cath showing patent stents with moderate disease along LAD  . COPD (chronic obstructive pulmonary disease) (HCC)   . Gout   . Hyperlipidemia   . Hypertensive heart disease   . Obesity   . Tobacco abuse     Assessment: Pharmacy consulted to dose heparin in this 61 year old male for atrial fibrillation. No anticoagulants PTA. Baseline labs ordered.  Goal of Therapy:  Heparin level 0.3-0.7 units/ml Monitor platelets by anticoagulation protocol: Yes   Plan:  Give 4500 units bolus x 1 Start heparin infusion at 1400 units/hr Check anti-Xa level in 6 hours and daily while on heparin Continue to monitor H&H and platelets  Cindi Carbon, PharmD Clinical Pharmacist 02/18/2017,8:45 PM

## 2017-02-18 NOTE — H&P (Signed)
[facility] - [place] [facility]   PATIENT NAME: [name]    MR#:  [id number]  DATE OF BIRTH:  21-Feb-1956  DATE OF ADMISSION:  02/18/2017  PRIMARY CARE PHYSICIAN: [facility]   REQUESTING/REFERRING PHYSICIAN: Dr. Dorothea Glassman.    CHIEF COMPLAINT:   Chief Complaint  Patient presents with  . Shortness of Breath    HISTORY OF PRESENT ILLNESS:  [name]  is a 62 y.o. male with a known history of ischemic cardiomyopathy with ejection fraction of 25-30%, previous history of coronary artery disease, COPD, gout, hypertension, hyperlipidemia,who presents to the hospital due to shortness of breath and palpitations. Patient says he's been having these symptoms now ongoing for the past 2 weeks. He was seen at the [place] in [place] this past Monday by his cardiologist and sent to the ER due to some hypotension and chest pain. As per the patient he was treated in the ER and then sent home but continued to feel palpitations. He returns. In the emergency room and is noted to be in atrial flutter with rapid ventricular response with heart rates in the 150s. Patient received some adenosine and also IV Cardizem bolus and then placed on a Cardizem drip. Hospitalist services were contacted further treatment and evaluation. Patient does complain of some intermittent chest pain which is associated with some shortness of breath but no nausea vomiting diaphoresis or syncope or any other associated symptoms.  PAST MEDICAL HISTORY:   Past Medical History:  Diagnosis Date  . CAD in native artery    a. stress echo 12/2007 abnl, EF > 55%, b. LHC 01/28/08: mLAD 30, D1 40, dLCx 70, pRCA 30, mRCA 70, mRCA lesion 2 80, PDA 90, s/p PCI/BMS to prox and distal RCA, s/p PCI/BMS to PDA; c. patient reports PCI/stenting x 2 in early 2017 at the [place] (no records on file) d. 06/2016: cath showing patent stents along RCA and LCx with moderate 40% stenosis along the LAD.   [name] Chronic combined systolic (congestive)  and diastolic (congestive) heart failure (HCC)    a. echo 2009: > 55% b. 06/2016: EF 25-30% by echo in 06/2016, cath showing patent stents with moderate disease along LAD  . COPD (chronic obstructive pulmonary disease) (HCC)   . Gout   . Hyperlipidemia   . Hypertensive heart disease   . Obesity   . Tobacco abuse     PAST SURGICAL HISTORY:   Past Surgical History:  Procedure Laterality Date  . CARDIAC CATHETERIZATION  2009   [facility];   . CARDIAC CATHETERIZATION  2010  . CARDIAC CATHETERIZATION N/A 07/28/2016   Procedure: Right and Left Heart Cath and possible PCI;  Surgeon: Iran Ouch, MD;  Location: [facility] INVASIVE CV LAB;  Service: Cardiovascular;  Laterality: N/A;  . CORONARY ANGIOPLASTY  2009   s/p stent placement at [facility].    SOCIAL HISTORY:   Social History  Substance Use Topics  . Smoking status: Former Smoker    Packs/day: 1.00    Years: 35.00    Types: Cigarettes  . Smokeless tobacco: Never Used  . Alcohol use No     Comment: Quit 8 yrs.  Used to drink heavily    FAMILY HISTORY:   Family History  Problem Relation Age of Onset  . Heart attack Mother 63    DRUG ALLERGIES:  No Known Allergies  REVIEW OF SYSTEMS:   Review of Systems  Constitutional: Negative for fever and weight loss.  HENT: Negative for congestion, nosebleeds  and tinnitus.   Eyes: Negative for blurred vision, double vision and redness.  Respiratory: Positive for shortness of breath. Negative for cough and hemoptysis.   Cardiovascular: Positive for palpitations. Negative for chest pain, orthopnea, leg swelling and PND.  Gastrointestinal: Negative for abdominal pain, diarrhea, melena, nausea and vomiting.  Genitourinary: Negative for dysuria, hematuria and urgency.  Musculoskeletal: Negative for falls and joint pain.  Neurological: Negative for dizziness, tingling, sensory change, focal weakness, seizures, weakness and headaches.  Endo/Heme/Allergies: Negative for polydipsia. Does not  bruise/bleed easily.  Psychiatric/Behavioral: Negative for depression and memory loss. The patient is not nervous/anxious.     MEDICATIONS AT HOME:   Prior to Admission medications   Medication Sig Start Date End Date Taking? Authorizing Provider  albuterol-ipratropium (COMBIVENT) 18-103 MCG/ACT inhaler Inhale 2 puffs into the lungs every 4 (four) hours as needed for wheezing.    [provider]  aspirin 81 MG tablet Take 81 mg by mouth daily.    [provider]  atorvastatin (LIPITOR) 40 MG tablet Take 20 mg by mouth daily.    [provider]  budesonide-formoterol (SYMBICORT) 160-4.5 MCG/ACT inhaler Inhale 2 puffs into the lungs 2 (two) times daily.    [provider]  carvedilol (COREG) 25 MG tablet Take 1 tablet (25 mg total) by mouth 2 (two) times daily with a meal. 07/31/16   Enedina Finner, MD  docusate sodium (COLACE) 100 MG capsule Take 100 mg by mouth 2 (two) times daily.    [provider]  isosorbide mononitrate (IMDUR) 60 MG 24 hr tablet Take 60 mg by mouth daily.    [provider]  isosorbide-hydrALAZINE (BIDIL) 20-37.5 MG tablet Take 1 tablet by mouth 3 (three) times daily. Patient not taking: Reported on 01/05/2017 07/31/16   Enedina Finner, MD  losartan (COZAAR) 25 MG tablet Take 1 tablet (25 mg total) by mouth daily. 01/08/17   Enedina Finner, MD  metFORMIN (GLUCOPHAGE) 1000 MG tablet Take 1,000 mg by mouth daily with breakfast.     [provider]  nicotine (NICODERM CQ - DOSED IN MG/24 HR) 7 mg/24hr patch Place 1 patch onto the skin daily.    [provider]  nitroGLYCERIN (NITROSTAT) 0.4 MG SL tablet Place 0.4 mg under the tongue every 5 (five) minutes as needed for chest pain.    [provider]  ondansetron (ZOFRAN) 4 MG tablet Take 4 mg by mouth every 4 (four) hours as needed for nausea.    [provider]  spironolactone (ALDACTONE) 25 MG tablet Take 1 tablet (25 mg total) by mouth daily. 08/01/16    Enedina Finner, MD  torsemide (DEMADEX) 20 MG tablet Take 2 tablets (40 mg total) by mouth daily. 01/07/17   Enedina Finner, MD  traZODone (DESYREL) 50 MG tablet Take 50 mg by mouth at bedtime.    [provider]      VITAL SIGNS:  Blood pressure (!) 134/92, temperature 98.5 F (36.9 C), temperature source Oral, resp. rate 16, height 6' (1.829 m), weight 95.7 kg (211 lb).  PHYSICAL EXAMINATION:  Physical Exam  GENERAL:  61 y.o.-year-old patient lying in the bed in no acute distress.  EYES: Pupils equal, round, reactive to light and accommodation. No scleral icterus. Extraocular muscles intact.  HEENT: Head atraumatic, normocephalic. Oropharynx and nasopharynx clear. No oropharyngeal erythema, moist oral mucosa  NECK:  Supple, no jugular venous distention. No thyroid enlargement, no tenderness.  LUNGS: Normal breath sounds bilaterally, no wheezing, rales, rhonchi. No use of accessory  muscles of respiration.  CARDIOVASCULAR: S1, S2 Irregular, Tachycardic. No murmurs, rubs, gallops, clicks.  ABDOMEN: Soft, nontender, nondistended. Bowel sounds present. No organomegaly or mass.  EXTREMITIES: No pedal edema, cyanosis, or clubbing. + 2 pedal & radial pulses b/l.   NEUROLOGIC: Cranial nerves II through XII are intact. No focal Motor or sensory deficits appreciated b/l PSYCHIATRIC: The patient is alert and oriented x 3.   SKIN: No obvious rash, lesion, or ulcer.   LABORATORY PANEL:   CBC  Recent Labs Lab 02/18/17 1816  WBC 6.1  HGB 13.2  HCT 40.1  PLT 136*   ------------------------------------------------------------------------------------------------------------------  Chemistries   Recent Labs Lab 02/18/17 1816  NA 140  K 3.8  CL 102  CO2 29  GLUCOSE 115*  BUN 19  CREATININE 1.20  CALCIUM 9.6   ------------------------------------------------------------------------------------------------------------------  Cardiac Enzymes  Recent Labs Lab 02/18/17 1816   TROPONINI 0.06*   ------------------------------------------------------------------------------------------------------------------  RADIOLOGY:  Dg Chest Portable 1 View  Result Date: 02/18/2017 CLINICAL DATA:  Chest pressure and shortness of breath today. EXAM: PORTABLE CHEST 1 VIEW COMPARISON:  PA and lateral chest 07/23/2016 and 01/05/2017. FINDINGS: There is cardiomegaly without edema. Lungs clear. No pneumothorax or pleural fluid. Aortic atherosclerosis noted. No acute bony abnormality. IMPRESSION: Cardiomegaly without acute disease. Atherosclerosis. Electronically Signed   By: Drusilla Kanner M.D.   On: 02/18/2017 18:54     IMPRESSION AND PLAN:   61 year old male with past medical history of coronary artery disease status post stents, ischemic cardiomyopathy ejection fraction of 25-30%, chronic systolic CHF, diabetes,COPD, presents to the hospital due to shortness of breath and palpitations.  1. Atrial flutter with rapid ventricular response-patient presented to the hospital with heart rates in the 150s. EKG is consistent with atrial flutter. - Patient given adenosine which did not break the patient's tachycardia, he was given IV Cardizem bolus and now has been started on a Cardizem drip. I will continue oral carvedilol. - Consult cardiology, keep on telemetry. Discussed the case with Dr. Ladona Ridgel over the phone. - pt. CHADSvasc score is 2 and will start on Heparin gtt for now.  Pt. Will need long term anticoagulation.   2.  Hx of Chronic systolic CHF - clinically pt. Is not in CHF.  - continue Aldactone, torsemide, carvedilol, losartan.  3. Diabetes type 2 without complication-hold metformin. Continue sliding scale insulin.  4. COPD-no acute exacerbation-continue Dulera  5. Hyperlipidemia-continue atorvastatin.  6. Elevated troponin-secondary to supply demand ischemia from her is atrial flutter with RVR, we'll cycle cardiac markers, observe on telemetry. Repeat a two-dimensional  echocardiogram. Get a cardiology consult.    All the records are reviewed and case discussed with ED provider. Management plans discussed with the patient, family and they are in agreement.  CODE STATUS: Full code  TOTAL TIME TAKING CARE OF THIS PATIENT: 45 minutes.    Houston Siren M.D on 02/18/2017 at 8:19 PM  Between 7am to 6pm - Pager - [phone]  After 6pm go to [email or web address] - password [email or web address]  [facility]  Office  [phone]  CC: Primary care physician; System, Pcp Not In

## 2017-02-19 ENCOUNTER — Telehealth: Payer: Self-pay | Admitting: Cardiovascular Disease

## 2017-02-19 ENCOUNTER — Inpatient Hospital Stay (HOSPITAL_COMMUNITY)
Admit: 2017-02-19 | Discharge: 2017-02-19 | Disposition: A | Discharge status: Home / Self Care | Payer: Medicare Other | Attending: Specialist | Admitting: Specialist

## 2017-02-19 DIAGNOSIS — I340 Nonrheumatic mitral (valve) insufficiency: Secondary | ICD-10-CM

## 2017-02-19 DIAGNOSIS — I25118 Atherosclerotic heart disease of native coronary artery with other forms of angina pectoris: Secondary | ICD-10-CM

## 2017-02-19 DIAGNOSIS — R0602 Shortness of breath: Secondary | ICD-10-CM

## 2017-02-19 DIAGNOSIS — I483 Typical atrial flutter: Secondary | ICD-10-CM

## 2017-02-19 DIAGNOSIS — I420 Dilated cardiomyopathy: Secondary | ICD-10-CM

## 2017-02-19 LAB — BASIC METABOLIC PANEL
Anion gap: 9 (ref 5–15)
BUN: 20 mg/dL (ref 6–20)
CO2: 26 mmol/L (ref 22–32)
CREATININE: 1.1 mg/dL (ref 0.61–1.24)
Calcium: 8.5 mg/dL — ABNORMAL LOW (ref 8.9–10.3)
Chloride: 104 mmol/L (ref 101–111)
GFR calc non Af Amer: >60 mL/min (ref >60)
Glucose, Bld: 240 mg/dL — ABNORMAL HIGH (ref 65–99)
POTASSIUM: 3.3 mmol/L — AB (ref 3.5–5.1)
SODIUM: 139 mmol/L (ref 135–145)

## 2017-02-19 LAB — CBC
HCT: 38.4 % — ABNORMAL LOW (ref 40.0–52.0)
HEMOGLOBIN: 12.3 g/dL — AB (ref 13.0–18.0)
MCH: 26.2 pg (ref 26.0–34.0)
MCHC: 32 g/dL (ref 32.0–36.0)
MCV: 81.7 fL (ref 80.0–100.0)
PLATELETS: 103 10*3/uL — AB (ref 150–440)
RBC: 4.7 MIL/uL (ref 4.40–5.90)
RDW: 19.8 % — ABNORMAL HIGH (ref 11.5–14.5)
WBC: 6 10*3/uL (ref 3.8–10.6)

## 2017-02-19 LAB — GLUCOSE, CAPILLARY: Glucose-Capillary: 159 mg/dL — ABNORMAL HIGH (ref 65–99)

## 2017-02-19 LAB — HEPARIN LEVEL (UNFRACTIONATED): Heparin Unfractionated: 0.67 IU/mL (ref 0.30–0.70)

## 2017-02-19 LAB — ECHOCARDIOGRAM COMPLETE
HEIGHTINCHES: 72 in
WEIGHTICAEL: 3376 [oz_av]

## 2017-02-19 LAB — MAGNESIUM: MAGNESIUM: 1.4 mg/dL — AB (ref 1.7–2.4)

## 2017-02-19 LAB — TROPONIN I
TROPONIN I: 0.06 ng/mL — AB (ref <0.03)
TROPONIN I: 0.05 ng/mL — AB (ref <0.03)

## 2017-02-19 MED ORDER — POTASSIUM CHLORIDE CRYS ER 20 MEQ PO TBCR: 40.0000 meq | EXTENDED_RELEASE_TABLET | Freq: Two times a day (BID) | ORAL | Status: DC

## 2017-02-19 MED ORDER — MORPHINE SULFATE (PF) 2 MG/ML IV SOLN: 2.0000 mg | INTRAVENOUS | Status: DC | PRN

## 2017-02-19 MED ORDER — MORPHINE SULFATE (PF) 2 MG/ML IV SOLN
1.0000 mg | INTRAVENOUS | Status: DC | PRN
  Administered 2017-02-19: 2 mg via INTRAVENOUS
  Filled 2017-02-19: qty 1

## 2017-02-19 MED ORDER — RIVAROXABAN 20 MG PO TABS: 20.0000 mg | ORAL_TABLET | Freq: Every day | ORAL | 0 refills | Status: DC

## 2017-02-19 MED ORDER — RIVAROXABAN 20 MG PO TABS: 20.0000 mg | ORAL_TABLET | Freq: Every day | ORAL | Status: DC

## 2017-02-19 MED ORDER — APIXABAN 5 MG PO TABS: 5.0000 mg | ORAL_TABLET | Freq: Two times a day (BID) | ORAL | Status: DC

## 2017-02-19 NOTE — Progress Notes (Signed)
Nutrition Education Note  RD consulted for nutrition education regarding a heart healthy diet  RD provided "Low Sodium Nutrition Therapy" handout from the Academy of [employer] and [employer]. Reviewed patient's dietary recall. Provided examples on ways to decrease sodium intake in diet. Discouraged intake of processed foods and use of salt shaker. Encouraged fresh fruits and vegetables as well as whole grain sources of carbohydrates to maximize fiber intake.   RD discussed why it is important for patient to adhere to diet recommendations, and emphasized the role of fluids, foods to avoid, and importance of weighing self daily. Teach back method used.  Expect fair compliance.  Body mass index is 28.62 kg/m. Pt meets criteria for overweight based on current BMI.  Current diet order is HH, patient is consuming approximately 100% of meals at this time. Labs and medications reviewed. No further nutrition interventions warranted at this time. RD contact information provided. If additional nutrition issues arise, please re-consult RD.   Betsey Holiday MS, RD, LDN Pager #- [phone] After Hours Pager: [phone]

## 2017-02-19 NOTE — Consult Note (Signed)
Cardiology Consultation Note  Patient ID: [name], MRN: [id number], DOB/AGE: [birth year] 61 y.o. Admit date: 02/18/2017   Date of Consult: 02/19/2017 Primary Physician: System, Pcp Not In Primary Cardiologist: [facility] Requesting Physician: Dr. Cherlynn Kaiser, MD  Chief Complaint: SOB Reason for Consult: New onset atrial flutter with RVR  HPI: [name] is a 61 y.o. male who is being seen today for the evaluation of new onset atrial flutter with RVR at the request of Dr. Cherlynn Kaiser, MD. Patient has a h/o CAD (s/p BMS to [facility] and BMS to PDA in 2009, reported stenting in 2017 at the [place] - no records on file), chronic combined systolic and diastolic CHF (EF 40-98% by echo in 06/2016), hypertensive heart disease, COPD, HTN, and HLD who is being seen today for the evaluation of new onset atrial flutter with RVR.   He was admitted in 06/2016 for acute on chronic diastolic CHF exacerbation. An echo at that time showed a reduced EF of 25-30%, previously normal, therefore a cardiac catheterization was recommended for further evaluation of his cardiomyopathy. This showed patent stents along the RCA and LCx with moderate 40% stenosis along the proximal and mid-LAD. Filling pressure were significantly elevated and he was noted to have severe pulmonary HTN, therefore he underwent further IV diuresis and was placed on Lasix 40mg  BID at the time of discharge. He has not followed-up with [facility] since as he no-showed for his hospital follow-up appointment and CHF Clinic appointment. He was most recently admitted in 12/2016 for volume overload with acute on chronic combined CHF. He was diuresed with improvement in symptoms.   He was recently seen by his [place] cardiologist on 8/20 with complaints of increased SOB and chest pain. He was sent to the ED. Records are not on file for review, though the patient indicates he was told he had an irregular heart beat at that time and was rate controlled with planned  outpatient follow up. Patient could not feel any palpitations, though did note increased SOB. His SOB persisted into 8/22 and HHRN noted he was tachycardic on 8/22 prompting him to come to the ED for evaluation.   Upon his arrival to the [facility] ED he was noted to be in new onset atrial flutter with RVR with heart rates ~ 150 bpm. He did not feel any palpitations, though did note some increased SOB. He was given adenosine without conversion. He was started on IV Cardizem and heparin with improvement in heart rates to the 70s bpm, remains in atrial flutter. Labs showed a mildly elevated, flate trending troponin with a peak of 0.06, K+ 3.3 (not repleted), WBC 6.0, HGB 12.3, PLT 103. VBP stable. Currently notes some SOB, no chest pain or palpitations.     Past Medical History:  Diagnosis Date  . CAD in native artery    a. stress echo 12/2007 abnl, EF > 55%, b. LHC 01/28/08: mLAD 30, D1 40, dLCx 70, pRCA 30, mRCA 70, mRCA lesion 2 80, PDA 90, s/p PCI/BMS to prox and distal RCA, s/p PCI/BMS to PDA; c. patient reports PCI/stenting x 2 in early 2017 at the [place] (no records on file) d. 06/2016: cath showing patent stents along RCA and LCx with moderate 40% stenosis along the LAD.   [name] Chronic combined systolic (congestive) and diastolic (congestive) heart failure (HCC)    a. echo 2009: > 55% b. 06/2016: EF 25-30% by echo in 06/2016, cath showing patent stents with moderate disease along LAD  . COPD (  chronic obstructive pulmonary disease) (HCC)   . Gout   . Hyperlipidemia   . Hypertensive heart disease   . Obesity   . Tobacco abuse       Most Recent Cardiac Studies: As above   Surgical History:  Past Surgical History:  Procedure Laterality Date  . CARDIAC CATHETERIZATION  2009   [facility];   . CARDIAC CATHETERIZATION  2010  . CARDIAC CATHETERIZATION N/A 07/28/2016   Procedure: Right and Left Heart Cath and possible PCI;  Surgeon: Iran Ouch, MD;  Location: [facility] INVASIVE CV LAB;  Service: Cardiovascular;   Laterality: N/A;  . CORONARY ANGIOPLASTY  2009   s/p stent placement at [facility].     Home Meds: Prior to Admission medications   Medication Sig Start Date End Date Taking? Authorizing Provider  albuterol-ipratropium (COMBIVENT) 18-103 MCG/ACT inhaler Inhale 2 puffs into the lungs every 4 (four) hours as needed for wheezing.   Yes [provider]  aspirin 81 MG tablet Take 81 mg by mouth daily.   Yes [provider]  atorvastatin (LIPITOR) 40 MG tablet Take 20 mg by mouth daily.   Yes [provider]  budesonide-formoterol (SYMBICORT) 160-4.5 MCG/ACT inhaler Inhale 2 puffs into the lungs 2 (two) times daily.   Yes [provider]  carvedilol (COREG) 25 MG tablet Take 1 tablet (25 mg total) by mouth 2 (two) times daily with a meal. 07/31/16  Yes Enedina Finner, MD  docusate sodium (COLACE) 100 MG capsule Take 100 mg by mouth 2 (two) times daily.   Yes [provider]  losartan (COZAAR) 25 MG tablet Take 1 tablet (25 mg total) by mouth daily. 01/08/17  Yes Enedina Finner, MD  metFORMIN (GLUCOPHAGE) 1000 MG tablet Take 1,000 mg by mouth daily with breakfast.    Yes [provider]  nitroGLYCERIN (NITROSTAT) 0.4 MG SL tablet Place 0.4 mg under the tongue every 5 (five) minutes as needed for chest pain.   Yes [provider]  ondansetron (ZOFRAN) 4 MG tablet Take 4 mg by mouth every 4 (four) hours as needed for nausea.   Yes [provider]  spironolactone (ALDACTONE) 25 MG tablet Take 1 tablet (25 mg total) by mouth daily. 08/01/16  Yes Enedina Finner, MD  torsemide (DEMADEX) 20 MG tablet Take 2 tablets (40 mg total) by mouth daily. 01/07/17  Yes Enedina Finner, MD  traZODone (DESYREL) 50 MG tablet Take 50 mg by mouth at bedtime.   Yes [provider]  isosorbide mononitrate (IMDUR) 60 MG 24 hr tablet Take 60 mg by mouth daily.    [provider]  isosorbide-hydrALAZINE (BIDIL) 20-37.5 MG tablet Take 1 tablet by mouth 3 (three)  times daily. Patient not taking: Reported on 01/05/2017 07/31/16   Enedina Finner, MD  nicotine (NICODERM CQ - DOSED IN MG/24 HR) 7 mg/24hr patch Place 1 patch onto the skin daily.    [provider]    Inpatient Medications:  . aspirin EC  81 mg Oral Daily  . atorvastatin  20 mg Oral Daily  . carvedilol  25 mg Oral BID WC  . docusate sodium  100 mg Oral BID  . insulin aspart  0-5 Units Subcutaneous QHS  . insulin aspart  0-9 Units Subcutaneous TID WC  . losartan  25 mg Oral Daily  . mometasone-formoterol  2 puff Inhalation BID  . spironolactone  25 mg Oral Daily  . torsemide  40 mg Oral Daily  . traZODone  50 mg Oral QHS   .  diltiazem (CARDIZEM) infusion 15 mg/hr (02/19/17 0649)  . heparin 1,400 Units/hr (02/18/17 2151)    Allergies: No Known Allergies  Social History   Social History  . Marital status: Married    Spouse name: N/A  . Number of children: N/A  . Years of education: N/A   Occupational History  . Not on file.   Social History Main Topics  . Smoking status: Former Smoker    Packs/day: 1.00    Years: 35.00    Types: Cigarettes  . Smokeless tobacco: Never Used  . Alcohol use No     Comment: Quit 8 yrs.  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 Drug use: No  . Sexual activity: Not on file   Other Topics Concern  . Not on file   Social History Narrative  . No narrative on file     Family History  Problem Relation Age of Onset  . Heart attack Mother 35     Review of Systems: Review of Systems  Constitutional: Positive for malaise/fatigue. Negative for chills, diaphoresis, fever and weight loss.  HENT: Negative for congestion.   Eyes: Negative for discharge and redness.  Respiratory: Positive for shortness of breath. Negative for cough, hemoptysis, sputum production and wheezing.   Cardiovascular: Positive for orthopnea. Negative for chest pain, palpitations, claudication, leg swelling and PND.  Gastrointestinal: Negative for abdominal pain, blood in stool,  constipation, diarrhea, heartburn, melena, nausea and vomiting.  Genitourinary: Negative for hematuria.  Musculoskeletal: Negative for falls and myalgias.  Skin: Negative for rash.  Neurological: Positive for weakness. Negative for dizziness, tingling, tremors, sensory change, speech change, focal weakness and loss of consciousness.  Endo/Heme/Allergies: Does not bruise/bleed easily.  Psychiatric/Behavioral: Negative for substance abuse. The patient is not nervous/anxious.   All other systems reviewed and are negative.   Labs:  Recent Labs  02/18/17 1816 02/18/17 2215 02/19/17 0210 02/19/17 0633  TROPONINI 0.06* 0.05* 0.06* 0.05*   Lab Results  Component Value Date   WBC 6.0 02/19/2017   HGB 12.3 (L) 02/19/2017   HCT 38.4 (L) 02/19/2017   MCV 81.7 02/19/2017   PLT 103 (L) 02/19/2017    Recent Labs Lab 02/19/17 0356  NA 139  K 3.3*  CL 104  CO2 26  BUN 20  CREATININE 1.10  CALCIUM 8.5*  GLUCOSE 240*   Lab Results  Component Value Date   CHOL 119 01/05/2017   HDL 36 (L) 01/05/2017   LDLCALC 64 01/05/2017   TRIG 93 01/05/2017   No results found for: DDIMER  Radiology/Studies:  Dg Chest Portable 1 View  Result Date: 02/18/2017 IMPRESSION: Cardiomegaly without acute disease. Atherosclerosis. Electronically Signed   By: Drusilla Kanner M.D.   On: 02/18/2017 18:54    EKG: Interpreted by me showed: Atrial flutter with RVR with variable AV block, left axis deviation, prior inferior infarct, inferolateral TWI, nonspecific anterior st elevation  Telemetry: Interpreted by me showed: Atrial flutter with variable AV block with RVR initially, currently in atrial flutter with variable AV block, rate controlled  Weights: [employer]   02/18/17 1816  Weight: 211 lb (95.7 kg)     Physical Exam: Blood pressure 104/64, pulse 71, temperature 97.8 F (36.6 C), temperature source Oral, resp. rate 17, height 6' (1.829 m), weight 211 lb (95.7 kg), SpO2 99 %. Body mass  index is 28.62 kg/m. General: Well developed, well nourished, in no acute distress. Head: Normocephalic, atraumatic, sclera non-icteric, no xanthomas, nares are without discharge.  Neck: Negative for carotid bruits.  JVD not elevated. Lungs: Diminished breath sounds bilaterally with faint bibasilar crackles. Breathing is unlabored. Heart: Irregular with S1 S2. No murmurs, rubs, or gallops appreciated. Abdomen: Soft, non-tender, non-distended with normoactive bowel sounds. No hepatomegaly. No rebound/guarding. No obvious abdominal masses. Msk:  Strength and tone appear normal for age. Extremities: No clubbing or cyanosis. No edema. Distal pedal pulses are 2+ and equal bilaterally. Neuro: Alert and oriented X 3. No facial asymmetry. No focal deficit. Moves all extremities spontaneously. Psych:  Responds to questions appropriately with a normal affect.    Assessment and Plan:  Active Problems:   Atrial flutter (HCC)    1. New onset atrial flutter with RVR: -Remains in atrial flutter, rate controlled with variable AV block -Will stop IV Cardizem given his cardiomyopathy  -Continue Coreg for rate control -Ambulate to assess for adequate rate control, if heart rate becomes tachycardic, he may need digoxin in addition to beta blocker above -Would like to avoid CCB given his CM as above -Transition heparin gtt to DOAC given CHADS2VASc of at least 4 (CHF, HTN, DM, vascular disease) -If his heart rate is poorly controlled with ambulation, he may require TEE/DCCV prior to discharge -He is unlikely to tolerate tachycardic rates very well given his cardiomyopathy -Replete potassium to > 4.0 -TSH normal -Check magnesium with recommendation to replete to > 2.0  2. Mild acute on chronic systolic CHF: -Mildly elevated BNP (though improved from prior) -Likely in the setting of #1 -Torsemide -Continue Coreg, losartan and spironolactone as BP allows  -Could consider changing losartan to Entresto  given his CM, though will defer to [facility] -TTE pending per IM -Strict Is and Os, daily weights  3. Elevated troponin: -Minimally elevated and flat trending with a peak of 0.06 -Likely supply demand ischemic in the setting of #1 and #2 -TTE pending as above -No current plans for inpatient ischemic evaluation given no chest pain   4. CAD: -As above -Will stop ASA and place patient on DOAC as above -Continue Liptior and Coreg  5. Hypokalemia: -Replete to goal 4.0 as above  6. DM2: -Per IM   Signed, Eula Listen, PA-C [facility] Pager: [phone] 02/19/2017, 7:50 AM

## 2017-02-19 NOTE — Care Management (Addendum)
Patient known to the [facility]  At present, there is no documentation that supports patient was asked about transfer.  Notified [facility] and will address whether patient wishes to be considered for transfer / refusal for transfer.  He is also followed by [facility] nursing and therapy.  Agency notified of admission.  Patient be to be started on Xarelto which he says he has never taken before.  Provided patient with 30 day trial coupon.

## 2017-02-19 NOTE — Consult Note (Signed)
Rounded on patient.  Patient admitted with atrial flutter;  mild acute on chronic CHF with mildly elevated BNP; CM;  elevated troponin; hx HTN, DM, CHF, and vascular disease.  Nonsmoker.  Echo done today and results are pending.  Previous echo in January 2018 the EF was 25 - 30%.  [facility].  Reviewed the definition of HF and what the EF measurement means with patient. Discussed the importance of patient weighing himself daily and assessing his symptoms.  Reviewed the heart failure zones and reiterated the importance of calling the physician when weight gain and symptoms fall in the Yellow Zone and calling 911 when weight gain and symptoms are in the Red Zone. Patient stated he has instructions from his cardiologist at the [facility] of what to do if his weight increases.  Reviewed medications used to treat heart failure with patient.  Low sodium, low fat, low cholesterol diet reviewed.  Patient immediately speaks up and states, "I do not add salt to my food.  I use no salt."  I then asked him if he eats processed foods or chips.  His reply was, "I ate a bag of chips last night."  I again reviewed low sodium foods with patient. Dietitian consult entered for further diet education.  Patient informed me he has in-home aides that come in three times a week, an Charity fundraiser, and PT.  PT will only be coming for three more weeks.  Cardiac Rehab discussed with patient. Overview and benefits of the program discussed.  Patient stated he is interested in participating.  Patient informed me he has a cardiologist at the [place].  I instructed the patient to request a referral for Cardiac Rehab at [facility]. Brochure given.  Informed the patient we would be able to schedule him for Cardiac Rehab orientation as soon as the patient has completed in-home PT.  Patient has been seen in [facility] in the past.  Follow-up appointment scheduled for 02/23/2017 at 8:20 a.m.  Informed patient the MD is wanting patient to schedule an  appointment with his doctor at the [place] in one to two weeks post discharge.  Encouraged patient to keep HF Clinic appointment as we like for our HF patients to be seen within 7 days of discharge to ensure no changes are needed in the treatment plan.    Army Melia, RN, BSN, [facility] Cardiovascular and Pulmonary Nurse Navigator 11:00 - 12:10

## 2017-02-19 NOTE — Progress Notes (Addendum)
Patient c/o of right sided pressure like chest pain, rating pain 6/10. BP 96/63. MD Tommye Standard, MD to place orders. Will continue to monitor.   Mayra Neer M

## 2017-02-19 NOTE — Progress Notes (Signed)
Patient ambulated around nursing station and HR remained in the 70's.  No complaints of shortness of breath or chest pain.  Planning to discharge today.

## 2017-02-19 NOTE — Progress Notes (Signed)
*  PRELIMINARY RESULTS* Echocardiogram 2D Echocardiogram has been performed.  Cristela Blue 02/19/2017, 9:55 AM

## 2017-02-19 NOTE — Discharge Summary (Signed)
[facility] - [place] at [facility]   PATIENT NAME: [name]    MR#:  [id number]  DATE OF BIRTH:  05-07-1956  DATE OF ADMISSION:  02/18/2017 ADMITTING PHYSICIAN: Houston Siren, MD  DATE OF DISCHARGE: 02/18/2017  [facility]    ADMISSION DIAGNOSIS:  Flutter-fibrillation [I49.8] Atrial fibrillation with RVR (HCC) [I48.91]  DISCHARGE DIAGNOSIS:  Active Problems:   Atrial flutter (HCC)   SECONDARY DIAGNOSIS:   Past Medical History:  Diagnosis Date  . CAD in native artery    a. stress echo 12/2007 abnl, EF > 55%, b. LHC 01/28/08: mLAD 30, D1 40, dLCx 70, pRCA 30, mRCA 70, mRCA lesion 2 80, PDA 90, s/p PCI/BMS to prox and distal RCA, s/p PCI/BMS to PDA; c. patient reports PCI/stenting x 2 in early 2017 at the [place] (no records on file) d. 06/2016: cath showing patent stents along RCA and LCx with moderate 40% stenosis along the LAD.   [name] Chronic combined systolic (congestive) and diastolic (congestive) heart failure (HCC)    a. echo 2009: > 55% b. 06/2016: EF 25-30% by echo in 06/2016, cath showing patent stents with moderate disease along LAD  . COPD (chronic obstructive pulmonary disease) (HCC)   . Gout   . Hyperlipidemia   . Hypertensive heart disease   . Obesity   . Tobacco abuse     HOSPITAL COURSE:  61 year old male with history of CAD, chronic combined systolic and diastolic heart failure can fraction 25-30%, COPD who presents with new onset atrial flutter with RVR.  1. New onset atrial flutter with RVR:  Patient remains in atrial flutter with variable AV block Due to low EF patient is not a candidate for cardizem Continue Coreg for heart rate control Continue anticoagulation for CHADS2VASc of at least 4 (CHF, HTN, DM, vascular disease) Risks, alternatives, benefits were discussed with patient He would also benefit from anticoagulation if in the future he needs cardioversion.   2. Acute on chronic combined systolic and  diastolic heart failure with improvement in symptoms Continue Coreg, losartan and Aldactone Could consider changing losartan to [facility] given his CM, though will defer to [facility]   3. Elevated troponin this is due to demand ischemia and not ACS. Patient is ruled out for ACS  4. CAD: Patient will continue on DOAC in place of ASA Continue Coreg and statin   5. Diabetes: Continue ADA diet with current outpatient regimen    DISCHARGE CONDITIONS AND DIET:  Stable Cardiac and diabetic diet  CONSULTS OBTAINED:  Treatment Team:  Antonieta Iba, MD  DRUG ALLERGIES:  No Known Allergies  DISCHARGE MEDICATIONS:   Current Discharge Medication List    START taking these medications   Details  rivaroxaban (XARELTO) 20 MG TABS tablet Take 1 tablet (20 mg total) by mouth daily with supper. Qty: 30 tablet, Refills: 0      CONTINUE these medications which have NOT CHANGED   Details  albuterol-ipratropium (COMBIVENT) 18-103 MCG/ACT inhaler Inhale 2 puffs into the lungs every 4 (four) hours as needed for wheezing.    atorvastatin (LIPITOR) 40 MG tablet Take 20 mg by mouth daily.    budesonide-formoterol (SYMBICORT) 160-4.5 MCG/ACT inhaler Inhale 2 puffs into the lungs 2 (two) times daily.    carvedilol (COREG) 25 MG tablet Take 1 tablet (25 mg total) by mouth 2 (two) times daily with a meal. Qty: 60 tablet, Refills: 0    docusate sodium (COLACE) 100 MG capsule Take  100 mg by mouth 2 (two) times daily.    losartan (COZAAR) 25 MG tablet Take 1 tablet (25 mg total) by mouth daily. Qty: 30 tablet, Refills: 1    metFORMIN (GLUCOPHAGE) 1000 MG tablet Take 1,000 mg by mouth daily with breakfast.     nitroGLYCERIN (NITROSTAT) 0.4 MG SL tablet Place 0.4 mg under the tongue every 5 (five) minutes as needed for chest pain.    spironolactone (ALDACTONE) 25 MG tablet Take 1 tablet (25 mg total) by mouth daily. Qty: 30 tablet, Refills: 0    torsemide (DEMADEX) 20 MG tablet Take 2  tablets (40 mg total) by mouth daily. Qty: 60 tablet, Refills: 0    traZODone (DESYREL) 50 MG tablet Take 50 mg by mouth at bedtime.    nicotine (NICODERM CQ - DOSED IN MG/24 HR) 7 mg/24hr patch Place 1 patch onto the skin daily.      STOP taking these medications     aspirin 81 MG tablet      ondansetron (ZOFRAN) 4 MG tablet      isosorbide mononitrate (IMDUR) 60 MG 24 hr tablet      isosorbide-hydrALAZINE (BIDIL) 20-37.5 MG tablet           Today   CHIEF COMPLAINT:   No acute events overnight. Patient remains in atrial flutter however heart rate is better controlled   VITAL SIGNS:  Blood pressure (!) 99/55, pulse 62, temperature 97.8 F (36.6 C), temperature source Oral, resp. rate 17, height 6' (1.829 m), weight 95.7 kg (211 lb), SpO2 99 %.   REVIEW OF SYSTEMS:  Review of Systems  Constitutional: Negative.  Negative for chills, fever and malaise/fatigue.  HENT: Negative.  Negative for ear discharge, ear pain, hearing loss, nosebleeds and sore throat.   Eyes: Negative.  Negative for blurred vision and pain.  Respiratory: Negative.  Negative for cough, hemoptysis, shortness of breath and wheezing.   Cardiovascular: Negative.  Negative for chest pain, palpitations and leg swelling.  Gastrointestinal: Negative.  Negative for abdominal pain, blood in stool, diarrhea, nausea and vomiting.  Genitourinary: Negative.  Negative for dysuria.  Musculoskeletal: Negative.  Negative for back pain.  Skin: Negative.   Neurological: Negative for dizziness, tremors, speech change, focal weakness, seizures and headaches.  Endo/Heme/Allergies: Negative.  Does not bruise/bleed easily.  Psychiatric/Behavioral: Negative.  Negative for depression, hallucinations and suicidal ideas.     PHYSICAL EXAMINATION:  GENERAL:  61 y.o.-year-old patient lying in the bed with no acute distress.  NECK:  Supple, no jugular venous distention. No thyroid enlargement, no tenderness.  LUNGS: Normal  breath sounds bilaterally, no wheezing, rales,rhonchi  No use of accessory muscles of respiration.  CARDIOVASCULAR: Irregular, regular . No murmurs, rubs, or gallops.  ABDOMEN: Soft, non-tender, non-distended. Bowel sounds present. No organomegaly or mass.  EXTREMITIES: No pedal edema, cyanosis, or clubbing.  PSYCHIATRIC: The patient is alert and oriented x 3.  SKIN: No obvious rash, lesion, or ulcer.   DATA REVIEW:   CBC  Recent Labs Lab 02/19/17 0356  WBC 6.0  HGB 12.3*  HCT 38.4*  PLT 103*    Chemistries   Recent Labs Lab 02/19/17 0356  NA 139  K 3.3*  CL 104  CO2 26  GLUCOSE 240*  BUN 20  CREATININE 1.10  CALCIUM 8.5*    Cardiac Enzymes  Recent Labs Lab 02/18/17 2215 02/19/17 0210 02/19/17 0633  TROPONINI 0.05* 0.06* 0.05*    Microbiology Results  @MICRORSLT48 @  RADIOLOGY:  Dg Chest Portable 1 View  Result Date: 02/18/2017 CLINICAL DATA:  Chest pressure and shortness of breath today. EXAM: PORTABLE CHEST 1 VIEW COMPARISON:  PA and lateral chest 07/23/2016 and 01/05/2017. FINDINGS: There is cardiomegaly without edema. Lungs clear. No pneumothorax or pleural fluid. Aortic atherosclerosis noted. No acute bony abnormality. IMPRESSION: Cardiomegaly without acute disease. Atherosclerosis. Electronically Signed   By: Drusilla Kanner M.D.   On: 02/18/2017 18:54      Current Discharge Medication List    START taking these medications   Details  rivaroxaban (XARELTO) 20 MG TABS tablet Take 1 tablet (20 mg total) by mouth daily with supper. Qty: 30 tablet, Refills: 0      CONTINUE these medications which have NOT CHANGED   Details  albuterol-ipratropium (COMBIVENT) 18-103 MCG/ACT inhaler Inhale 2 puffs into the lungs every 4 (four) hours as needed for wheezing.    atorvastatin (LIPITOR) 40 MG tablet Take 20 mg by mouth daily.    budesonide-formoterol (SYMBICORT) 160-4.5 MCG/ACT inhaler Inhale 2 puffs into the lungs 2 (two) times daily.    carvedilol  (COREG) 25 MG tablet Take 1 tablet (25 mg total) by mouth 2 (two) times daily with a meal. Qty: 60 tablet, Refills: 0    docusate sodium (COLACE) 100 MG capsule Take 100 mg by mouth 2 (two) times daily.    losartan (COZAAR) 25 MG tablet Take 1 tablet (25 mg total) by mouth daily. Qty: 30 tablet, Refills: 1    metFORMIN (GLUCOPHAGE) 1000 MG tablet Take 1,000 mg by mouth daily with breakfast.     nitroGLYCERIN (NITROSTAT) 0.4 MG SL tablet Place 0.4 mg under the tongue every 5 (five) minutes as needed for chest pain.    spironolactone (ALDACTONE) 25 MG tablet Take 1 tablet (25 mg total) by mouth daily. Qty: 30 tablet, Refills: 0    torsemide (DEMADEX) 20 MG tablet Take 2 tablets (40 mg total) by mouth daily. Qty: 60 tablet, Refills: 0    traZODone (DESYREL) 50 MG tablet Take 50 mg by mouth at bedtime.    nicotine (NICODERM CQ - DOSED IN MG/24 HR) 7 mg/24hr patch Place 1 patch onto the skin daily.      STOP taking these medications     aspirin 81 MG tablet      ondansetron (ZOFRAN) 4 MG tablet      isosorbide mononitrate (IMDUR) 60 MG 24 hr tablet      isosorbide-hydrALAZINE (BIDIL) 20-37.5 MG tablet            Management plans discussed with the patient and he is in agreement. Stable for discharge   [facility]  CODE STATUS:     Code Status Orders        Start     Ordered   02/18/17 2113  Full code  Continuous     02/18/17 2112    Code Status History    Date Active Date Inactive Code Status Order ID Comments User Context   01/05/2017 10:46 PM 01/07/2017  4:14 PM Full Code [id number]  Altamese Dilling, MD Inpatient   07/24/2016  2:35 AM 07/31/2016  2:01 PM Full Code [id number]  Oralia Manis, MD Inpatient    Advance Directive Documentation     Most Recent Value  Type of Advance Directive  Healthcare Power of Attorney, Living will  Pre-existing out of facility DNR order (yellow form or pink MOST form)  -  "MOST" Form in Place?  -       TOTAL TIME TAKING CARE OF  THIS PATIENT: 37 minutes.    Note: This dictation was prepared with Dragon dictation along with smaller phrase technology. Any transcriptional errors that result from this process are unintentional.  Ladeidra Borys M.D on 02/19/2017 at 9:38 AM  Between 7am to 6pm - Pager - [phone] After 6pm go to [email or web address] - Social research officer, government  [facility] [place] [facility]  Office  [phone]  CC: Primary care physician; [facility]

## 2017-02-19 NOTE — Telephone Encounter (Signed)
[facility] ph [facility] for Afib rvr  Patient needs 2 weeks fu  Scheduled with [facility]iment 9/12 at 830 added to wait list    Per note from today  From Dr. Mariah Milling patient is est at [facility] and is to fu outpatient there.   Appt Cancelled .  Julisa aware.

## 2017-02-19 NOTE — Progress Notes (Signed)
ANTICOAGULATION CONSULT NOTE - Initial Consult  Pharmacy Consult for heparin drip Indication: atrial fibrillation  No Known Allergies  Patient Measurements: Height: 6' (182.9 cm) Weight: 211 lb (95.7 kg) IBW/kg (Calculated) : 77.6 Heparin Dosing Weight: 95 kg  Vital Signs: Temp: 97.8 F (36.6 C) (08/23 0333) Temp Source: Oral (08/23 0333) BP: 98/57 (08/23 0335) Pulse Rate: 84 (08/23 0335)  Labs:  Recent Labs  02/18/17 1816 02/18/17 2215 02/19/17 0210 02/19/17 0356  HGB 13.2  --   --  12.3*  HCT 40.1  --   --  38.4*  PLT 136*  --   --  103*  APTT 31  --   --   --   LABPROT 14.7  --   --   --   INR 1.14  --   --   --   HEPARINUNFRC  --   --   --  0.67  CREATININE 1.20  --   --  1.10  TROPONINI 0.06* 0.05* 0.06*  --     Estimated Creatinine Clearance: 85.7 mL/min (by C-G formula based on SCr of 1.1 mg/dL).   Medical History: Past Medical History:  Diagnosis Date  . CAD in native artery    a. stress echo 12/2007 abnl, EF > 55%, b. LHC 01/28/08: mLAD 30, D1 40, dLCx 70, pRCA 30, mRCA 70, mRCA lesion 2 80, PDA 90, s/p PCI/BMS to prox and distal RCA, s/p PCI/BMS to PDA; c. patient reports PCI/stenting x 2 in early 2017 at the [place] (no records on file) d. 06/2016: cath showing patent stents along RCA and LCx with moderate 40% stenosis along the LAD.   [name] Chronic combined systolic (congestive) and diastolic (congestive) heart failure (HCC)    a. echo 2009: > 55% b. 06/2016: EF 25-30% by echo in 06/2016, cath showing patent stents with moderate disease along LAD  . COPD (chronic obstructive pulmonary disease) (HCC)   . Gout   . Hyperlipidemia   . Hypertensive heart disease   . Obesity   . Tobacco abuse     Assessment: Pharmacy consulted to dose heparin in this 61 year old male for atrial fibrillation. No anticoagulants PTA. Baseline labs ordered.  Goal of Therapy:  Heparin level 0.3-0.7 units/ml Monitor platelets by anticoagulation protocol: Yes   Plan:  Give 4500  units bolus x 1 Start heparin infusion at 1400 units/hr Check anti-Xa level in 6 hours and daily while on heparin Continue to monitor H&H and platelets   8/23 0400 heparin level 0.67. Continue current regimen. Recheck in 6 hours to confirm.  Erich Montane, PharmD Clinical Pharmacist 02/19/2017,4:56 AM

## 2017-02-19 NOTE — Progress Notes (Addendum)
[facility] - [place] at [facility]   PATIENT NAME: [name]    MR#:  [id number]  DATE OF BIRTH:  02/05/1956  SUBJECTIVE:   Patient here with atrial flutter rapid heart rate. Appears to be more comfortable this morning. Although does state that when he gets up he feels winded.  REVIEW OF SYSTEMS:    Review of Systems  Constitutional: Negative for fever, chills weight loss HENT: Negative for ear pain, nosebleeds, congestion, facial swelling, rhinorrhea, neck pain, neck stiffness and ear discharge.   Respiratory: Negative for cough, shortness of breath, wheezing  Cardiovascular: Negative for chest pain, palpitations and leg swelling.  Gastrointestinal: Negative for heartburn, abdominal pain, vomiting, diarrhea or consitpation Genitourinary: Negative for dysuria, urgency, frequency, hematuria Musculoskeletal: Negative for back pain or joint pain Neurological: Negative for dizziness, seizures, syncope, focal weakness,  numbness and headaches.  Hematological: Does not bruise/bleed easily.  Psychiatric/Behavioral: Negative for hallucinations, confusion, dysphoric mood    Tolerating Diet: yes      DRUG ALLERGIES:  No Known Allergies  VITALS:  Blood pressure (!) 99/55, pulse 62, temperature 97.8 F (36.6 C), temperature source Oral, resp. rate 17, height 6' (1.829 m), weight 95.7 kg (211 lb), SpO2 99 %.  PHYSICAL EXAMINATION:  Constitutional: Appears well-developed and well-nourished. No distress. HENT: Normocephalic. [name] Oropharynx is clear and moist.  Eyes: Conjunctivae and EOM are normal. PERRLA, no scleral icterus.  Neck: Normal ROM. Neck supple. No JVD. No tracheal deviation. CVS: Irregular regular, S1/S2 +, no murmurs, no gallops, no carotid bruit.  Pulmonary: Effort and breath sounds normal, no stridor, rhonchi, wheezes, rales.  Abdominal: Soft. BS +,  no distension, tenderness, rebound or guarding.  Musculoskeletal: Normal range of motion. No edema  and no tenderness.  Neuro: Alert. CN 2-12 grossly intact. No focal deficits. Skin: Skin is warm and dry. No rash noted. Psychiatric: Normal mood and affect.      LABORATORY PANEL:   CBC  Recent Labs Lab 02/19/17 0356  WBC 6.0  HGB 12.3*  HCT 38.4*  PLT 103*   ------------------------------------------------------------------------------------------------------------------  Chemistries   Recent Labs Lab 02/19/17 0356  NA 139  K 3.3*  CL 104  CO2 26  GLUCOSE 240*  BUN 20  CREATININE 1.10  CALCIUM 8.5*   ------------------------------------------------------------------------------------------------------------------  Cardiac Enzymes  Recent Labs Lab 02/18/17 2215 02/19/17 0210 02/19/17 0633  TROPONINI 0.05* 0.06* 0.05*   ------------------------------------------------------------------------------------------------------------------  RADIOLOGY:  Dg Chest Portable 1 View  Result Date: 02/18/2017 CLINICAL DATA:  Chest pressure and shortness of breath today. EXAM: PORTABLE CHEST 1 VIEW COMPARISON:  PA and lateral chest 07/23/2016 and 01/05/2017. FINDINGS: There is cardiomegaly without edema. Lungs clear. No pneumothorax or pleural fluid. Aortic atherosclerosis noted. No acute bony abnormality. IMPRESSION: Cardiomegaly without acute disease. Atherosclerosis. Electronically Signed   By: Drusilla Kanner M.D.   On: 02/18/2017 18:54     ASSESSMENT AND PLAN:   61 year old male with history of CAD, chronic combined systolic and diastolic heart failure can fraction 25-30%, COPD who presents with new onset atrial flutter with RVR.  1. New onset atrial flutter with RVR:  Patient remains in atrial flutter with variable AV block Due to low EF patient is not a candidate for cardizem Continue Coreg for heart rate control Continue anticoagulation for CHADS2VASc of at least 4 (CHF, HTN, DM, vascular disease) Risks, alternatives, benefits were discussed with patient HE  would benefit from Anticoagulation for at least one month. He may need cardioversion in the future.   2.  Acute on chronic combined systolic and diastolic heart failure with improvement in symptoms Continue Coreg, losartan and Aldactone Could consider changing losartan to [facility] given his CM, though will defer to [facility]   3. Elevated troponin this is due to demand ischemia and not ACS. Patient is ruled out for ACS  4. CAD: Patient will continue on DOAC in place of ASA Continue Coreg and statin   5. Diabetes: Continue ADA diet with current outpatient regimen  Management plans discussed with the patient and he is in agreement.  CODE STATUS: full  TOTAL TIME TAKING CARE OF THIS PATIENT: 30 minutes.     POSSIBLE D/C today?, DEPENDING ON CLINICAL CONDITION.   Palma Buster M.D on 02/19/2017 at 9:33 AM  Between 7am to 6pm - Pager - [phone] After 6pm go to [email or web address] - password [email or web address]  [facility] [place] [facility]  Office  [phone]  CC: Primary care physician; System, Pcp Not In  Note: This dictation was prepared with Dragon dictation along with smaller phrase technology. Any transcriptional errors that result from this process are unintentional.

## 2017-02-19 NOTE — Discharge Instructions (Signed)
Heart Failure Clinic appointment on February 23 2017 at 8:20am with Clarisa Kindred, FNP. Please call [phone] to reschedule.

## 2017-02-23 ENCOUNTER — Telehealth: Payer: Self-pay | Admitting: Family

## 2017-02-23 ENCOUNTER — Ambulatory Visit: Payer: Medicare Other | Admitting: Family

## 2017-02-23 NOTE — Telephone Encounter (Signed)
Patient missed his initial appointment at the Heart Failure Clinic on 02/23/17. Will attempt to reschedule.

## 2017-03-11 ENCOUNTER — Ambulatory Visit: Payer: Medicare Other | Admitting: Nurse Practitioner

## 2017-03-26 ENCOUNTER — Emergency Department: Payer: Medicare Other

## 2017-03-26 ENCOUNTER — Inpatient Hospital Stay
Admission: EM | Admit: 2017-03-26 | Discharge: 2017-03-27 | DRG: 309 | Disposition: A | Discharge status: Home / Self Care | Payer: Medicare Other | Attending: Internal Medicine | Admitting: Internal Medicine

## 2017-03-26 ENCOUNTER — Encounter: Payer: Self-pay | Admitting: Emergency Medicine

## 2017-03-26 DIAGNOSIS — Z7951 Long term (current) use of inhaled steroids: Secondary | ICD-10-CM

## 2017-03-26 DIAGNOSIS — Z7901 Long term (current) use of anticoagulants: Secondary | ICD-10-CM

## 2017-03-26 DIAGNOSIS — I248 Other forms of acute ischemic heart disease: Secondary | ICD-10-CM | POA: Diagnosis present

## 2017-03-26 DIAGNOSIS — Z886 Allergy status to analgesic agent status: Secondary | ICD-10-CM

## 2017-03-26 DIAGNOSIS — I5042 Chronic combined systolic (congestive) and diastolic (congestive) heart failure: Secondary | ICD-10-CM | POA: Diagnosis present

## 2017-03-26 DIAGNOSIS — I429 Cardiomyopathy, unspecified: Secondary | ICD-10-CM | POA: Diagnosis present

## 2017-03-26 DIAGNOSIS — I4892 Unspecified atrial flutter: Secondary | ICD-10-CM | POA: Diagnosis present

## 2017-03-26 DIAGNOSIS — Z955 Presence of coronary angioplasty implant and graft: Secondary | ICD-10-CM

## 2017-03-26 DIAGNOSIS — Z79899 Other long term (current) drug therapy: Secondary | ICD-10-CM | POA: Diagnosis not present

## 2017-03-26 DIAGNOSIS — I4581 Long QT syndrome: Secondary | ICD-10-CM | POA: Diagnosis present

## 2017-03-26 DIAGNOSIS — E785 Hyperlipidemia, unspecified: Secondary | ICD-10-CM | POA: Diagnosis present

## 2017-03-26 DIAGNOSIS — Z87891 Personal history of nicotine dependence: Secondary | ICD-10-CM

## 2017-03-26 DIAGNOSIS — Z7984 Long term (current) use of oral hypoglycemic drugs: Secondary | ICD-10-CM

## 2017-03-26 DIAGNOSIS — J441 Chronic obstructive pulmonary disease with (acute) exacerbation: Secondary | ICD-10-CM | POA: Diagnosis present

## 2017-03-26 DIAGNOSIS — E119 Type 2 diabetes mellitus without complications: Secondary | ICD-10-CM | POA: Diagnosis present

## 2017-03-26 DIAGNOSIS — I110 Hypertensive heart disease with heart failure: Secondary | ICD-10-CM | POA: Diagnosis present

## 2017-03-26 DIAGNOSIS — I4891 Unspecified atrial fibrillation: Secondary | ICD-10-CM | POA: Diagnosis present

## 2017-03-26 DIAGNOSIS — I2510 Atherosclerotic heart disease of native coronary artery without angina pectoris: Secondary | ICD-10-CM | POA: Diagnosis present

## 2017-03-26 LAB — GLUCOSE, CAPILLARY
Glucose-Capillary: 240 mg/dL — ABNORMAL HIGH (ref 65–99)
Glucose-Capillary: 379 mg/dL — ABNORMAL HIGH (ref 65–99)
Glucose-Capillary: 410 mg/dL — ABNORMAL HIGH (ref 65–99)
Glucose-Capillary: 362 mg/dL — ABNORMAL HIGH (ref 65–99)
Glucose-Capillary: 343 mg/dL — ABNORMAL HIGH (ref 65–99)
Glucose-Capillary: 306 mg/dL — ABNORMAL HIGH (ref 65–99)

## 2017-03-26 LAB — CBC
HCT: 41.7 % (ref 40.0–52.0)
HEMOGLOBIN: 13.6 g/dL (ref 13.0–18.0)
MCH: 27.3 pg (ref 26.0–34.0)
MCHC: 32.7 g/dL (ref 32.0–36.0)
MCV: 83.4 fL (ref 80.0–100.0)
Platelets: 206 10*3/uL (ref 150–440)
RBC: 5 MIL/uL (ref 4.40–5.90)
RDW: 21.5 % — ABNORMAL HIGH (ref 11.5–14.5)
WBC: 9.3 10*3/uL (ref 3.8–10.6)

## 2017-03-26 LAB — BASIC METABOLIC PANEL
Anion gap: 8 (ref 5–15)
BUN: 18 mg/dL (ref 6–20)
CALCIUM: 9.9 mg/dL (ref 8.9–10.3)
CHLORIDE: 103 mmol/L (ref 101–111)
CO2: 29 mmol/L (ref 22–32)
Creatinine, Ser: 1.01 mg/dL (ref 0.61–1.24)
GFR calc Af Amer: >60 mL/min (ref >60)
Glucose, Bld: 122 mg/dL — ABNORMAL HIGH (ref 65–99)
POTASSIUM: 3.7 mmol/L (ref 3.5–5.1)
Sodium: 140 mmol/L (ref 135–145)

## 2017-03-26 LAB — TROPONIN I
TROPONIN I: 0.07 ng/mL — AB (ref <0.03)
TROPONIN I: 0.06 ng/mL — AB (ref <0.03)

## 2017-03-26 LAB — TSH: TSH: 1.129 u[IU]/mL (ref 0.350–4.500)

## 2017-03-26 LAB — BRAIN NATRIURETIC PEPTIDE: B NATRIURETIC PEPTIDE 5: 202 pg/mL — AB (ref 0.0–100.0)

## 2017-03-26 MED ORDER — TRAZODONE HCL 50 MG PO TABS
50.0000 mg | ORAL_TABLET | Freq: Every day | ORAL | Status: DC
  Administered 2017-03-26: 50 mg via ORAL
  Filled 2017-03-26: qty 1

## 2017-03-26 MED ORDER — DOCUSATE SODIUM 100 MG PO CAPS
100.0000 mg | ORAL_CAPSULE | Freq: Two times a day (BID) | ORAL | Status: DC
  Administered 2017-03-26 – 2017-03-27 (×2): 100 mg via ORAL
  Filled 2017-03-26 (×2): qty 1

## 2017-03-26 MED ORDER — SODIUM CHLORIDE 0.9% FLUSH: 3.0000 mL | INTRAVENOUS | Status: DC | PRN

## 2017-03-26 MED ORDER — DILTIAZEM HCL 100 MG IV SOLR: 20.0000 mg | Freq: Once | INTRAVENOUS | Status: DC

## 2017-03-26 MED ORDER — GUAIFENESIN-DM 100-10 MG/5ML PO SYRP: 5.0000 mL | ORAL_SOLUTION | ORAL | Status: DC | PRN

## 2017-03-26 MED ORDER — INFLUENZA VAC SPLIT QUAD 0.5 ML IM SUSY: 0.5000 mL | PREFILLED_SYRINGE | INTRAMUSCULAR | Status: DC

## 2017-03-26 MED ORDER — DOXYCYCLINE HYCLATE 100 MG PO TABS
100.0000 mg | ORAL_TABLET | Freq: Once | ORAL | Status: AC
  Administered 2017-03-26: 100 mg via ORAL
  Filled 2017-03-26: qty 1

## 2017-03-26 MED ORDER — DEXTROSE 5 % IV SOLN
5.0000 mg/h | INTRAVENOUS | Status: DC
  Administered 2017-03-26: 7.5 mg/h via INTRAVENOUS
  Administered 2017-03-26: 5 mg/h via INTRAVENOUS
  Administered 2017-03-26: 10 mg/h via INTRAVENOUS
  Administered 2017-03-26: 7.5 mg/h via INTRAVENOUS
  Administered 2017-03-27: 5 mg/h via INTRAVENOUS
  Filled 2017-03-26 (×2): qty 100

## 2017-03-26 MED ORDER — IPRATROPIUM-ALBUTEROL 0.5-2.5 (3) MG/3ML IN SOLN
RESPIRATORY_TRACT | Status: AC
  Filled 2017-03-26: qty 6

## 2017-03-26 MED ORDER — CARVEDILOL 25 MG PO TABS
25.0000 mg | ORAL_TABLET | Freq: Two times a day (BID) | ORAL | Status: DC
Start: 2017-03-26 — End: 2017-03-27
  Administered 2017-03-26 (×2): 25 mg via ORAL
  Filled 2017-03-26 (×3): qty 1

## 2017-03-26 MED ORDER — ATORVASTATIN CALCIUM 20 MG PO TABS
40.0000 mg | ORAL_TABLET | Freq: Every day | ORAL | Status: DC
  Administered 2017-03-26: 40 mg via ORAL
  Filled 2017-03-26: qty 2

## 2017-03-26 MED ORDER — INSULIN ASPART 100 UNIT/ML ~~LOC~~ SOLN
0.0000 [IU] | Freq: Every day | SUBCUTANEOUS | Status: DC
  Administered 2017-03-26: 4 [IU] via SUBCUTANEOUS
  Filled 2017-03-26: qty 1

## 2017-03-26 MED ORDER — AZITHROMYCIN 250 MG PO TABS: 500.0000 mg | ORAL_TABLET | Freq: Every day | ORAL | Status: DC

## 2017-03-26 MED ORDER — PREDNISONE 10 MG PO TABS: 5.0000 mg | ORAL_TABLET | Freq: Every day | ORAL | Status: DC

## 2017-03-26 MED ORDER — PREDNISONE 10 MG PO TABS: 10.0000 mg | ORAL_TABLET | Freq: Every day | ORAL | Status: DC

## 2017-03-26 MED ORDER — MOMETASONE FURO-FORMOTEROL FUM 200-5 MCG/ACT IN AERO
2.0000 | INHALATION_SPRAY | Freq: Two times a day (BID) | RESPIRATORY_TRACT | Status: DC
  Administered 2017-03-26 – 2017-03-27 (×3): 2 via RESPIRATORY_TRACT
  Filled 2017-03-26: qty 8.8

## 2017-03-26 MED ORDER — SODIUM CHLORIDE 0.9 % IV SOLN
250.0000 mL | INTRAVENOUS | Status: DC
Start: 2017-03-26 — End: 2017-03-27
  Administered 2017-03-27: 10:00:00 via INTRAVENOUS

## 2017-03-26 MED ORDER — INSULIN ASPART 100 UNIT/ML ~~LOC~~ SOLN
0.0000 [IU] | Freq: Three times a day (TID) | SUBCUTANEOUS | Status: DC
  Administered 2017-03-27: 7 [IU] via SUBCUTANEOUS
  Administered 2017-03-27: 4 [IU] via SUBCUTANEOUS
  Filled 2017-03-26: qty 1

## 2017-03-26 MED ORDER — RIVAROXABAN 20 MG PO TABS
20.0000 mg | ORAL_TABLET | Freq: Every day | ORAL | Status: DC
  Administered 2017-03-26: 20 mg via ORAL
  Filled 2017-03-26: qty 1

## 2017-03-26 MED ORDER — INSULIN ASPART 100 UNIT/ML ~~LOC~~ SOLN: 0.0000 [IU] | Freq: Three times a day (TID) | SUBCUTANEOUS | Status: DC

## 2017-03-26 MED ORDER — IPRATROPIUM-ALBUTEROL 0.5-2.5 (3) MG/3ML IN SOLN
3.0000 mL | Freq: Once | RESPIRATORY_TRACT | Status: AC
  Administered 2017-03-26: 3 mL via RESPIRATORY_TRACT
  Filled 2017-03-26: qty 3

## 2017-03-26 MED ORDER — METOPROLOL TARTRATE 5 MG/5ML IV SOLN
5.0000 mg | Freq: Once | INTRAVENOUS | Status: AC
  Administered 2017-03-26: 5 mg via INTRAVENOUS
  Filled 2017-03-26: qty 5

## 2017-03-26 MED ORDER — PREDNISONE 50 MG PO TABS
50.0000 mg | ORAL_TABLET | Freq: Every day | ORAL | Status: AC
Start: 2017-03-26 — End: 2017-03-26
  Administered 2017-03-26: 50 mg via ORAL
  Filled 2017-03-26: qty 1

## 2017-03-26 MED ORDER — ONDANSETRON HCL 4 MG PO TABS: 4.0000 mg | ORAL_TABLET | Freq: Four times a day (QID) | ORAL | Status: DC | PRN

## 2017-03-26 MED ORDER — METHYLPREDNISOLONE SODIUM SUCC 125 MG IJ SOLR
125.0000 mg | Freq: Once | INTRAMUSCULAR | Status: AC
  Administered 2017-03-26: 125 mg via INTRAVENOUS
  Filled 2017-03-26: qty 2

## 2017-03-26 MED ORDER — LOSARTAN POTASSIUM 25 MG PO TABS
25.0000 mg | ORAL_TABLET | Freq: Every day | ORAL | Status: DC
  Administered 2017-03-26 – 2017-03-27 (×2): 25 mg via ORAL
  Filled 2017-03-26 (×2): qty 1

## 2017-03-26 MED ORDER — AZITHROMYCIN 250 MG PO TABS: 250.0000 mg | ORAL_TABLET | Freq: Every day | ORAL | Status: DC

## 2017-03-26 MED ORDER — SODIUM CHLORIDE 0.9% FLUSH
3.0000 mL | Freq: Two times a day (BID) | INTRAVENOUS | Status: DC
  Administered 2017-03-26: 3 mL via INTRAVENOUS

## 2017-03-26 MED ORDER — IPRATROPIUM-ALBUTEROL 0.5-2.5 (3) MG/3ML IN SOLN
3.0000 mL | Freq: Once | RESPIRATORY_TRACT | Status: AC
  Administered 2017-03-26: 3 mL via RESPIRATORY_TRACT

## 2017-03-26 MED ORDER — ACETAMINOPHEN 650 MG RE SUPP: 650.0000 mg | Freq: Four times a day (QID) | RECTAL | Status: DC | PRN

## 2017-03-26 MED ORDER — DOXYCYCLINE HYCLATE 100 MG PO TABS
100.0000 mg | ORAL_TABLET | Freq: Two times a day (BID) | ORAL | Status: DC
  Administered 2017-03-26 – 2017-03-27 (×3): 100 mg via ORAL
  Filled 2017-03-26 (×3): qty 1

## 2017-03-26 MED ORDER — TORSEMIDE 20 MG PO TABS
40.0000 mg | ORAL_TABLET | Freq: Every day | ORAL | Status: DC
  Administered 2017-03-26 – 2017-03-27 (×2): 40 mg via ORAL
  Filled 2017-03-26 (×2): qty 2

## 2017-03-26 MED ORDER — NITROGLYCERIN 0.4 MG SL SUBL: 0.4000 mg | SUBLINGUAL_TABLET | SUBLINGUAL | Status: DC | PRN

## 2017-03-26 MED ORDER — PREDNISONE 10 MG PO TABS: 30.0000 mg | ORAL_TABLET | Freq: Every day | ORAL | Status: DC

## 2017-03-26 MED ORDER — DILTIAZEM HCL 25 MG/5ML IV SOLN
20.0000 mg | Freq: Once | INTRAVENOUS | Status: AC
  Administered 2017-03-26: 20 mg via INTRAVENOUS
  Filled 2017-03-26: qty 5

## 2017-03-26 MED ORDER — INSULIN ASPART 100 UNIT/ML ~~LOC~~ SOLN
0.0000 [IU] | Freq: Three times a day (TID) | SUBCUTANEOUS | Status: DC
  Administered 2017-03-26: 20 [IU] via SUBCUTANEOUS
  Filled 2017-03-26: qty 1

## 2017-03-26 MED ORDER — IPRATROPIUM-ALBUTEROL 0.5-2.5 (3) MG/3ML IN SOLN
3.0000 mL | Freq: Four times a day (QID) | RESPIRATORY_TRACT | Status: DC
  Administered 2017-03-26 – 2017-03-27 (×4): 3 mL via RESPIRATORY_TRACT
  Filled 2017-03-26 (×5): qty 3

## 2017-03-26 MED ORDER — IPRATROPIUM-ALBUTEROL 0.5-2.5 (3) MG/3ML IN SOLN
3.0000 mL | Freq: Once | RESPIRATORY_TRACT | Status: AC
Start: 2017-03-26 — End: 2017-03-26
  Administered 2017-03-26: 3 mL via RESPIRATORY_TRACT

## 2017-03-26 MED ORDER — SPIRONOLACTONE 25 MG PO TABS
25.0000 mg | ORAL_TABLET | Freq: Every day | ORAL | Status: DC
Start: 2017-03-26 — End: 2017-03-27
  Administered 2017-03-26 – 2017-03-27 (×2): 25 mg via ORAL
  Filled 2017-03-26 (×2): qty 1

## 2017-03-26 MED ORDER — PREDNISONE 20 MG PO TABS
40.0000 mg | ORAL_TABLET | Freq: Every day | ORAL | Status: AC
  Administered 2017-03-27: 40 mg via ORAL
  Filled 2017-03-26: qty 2

## 2017-03-26 MED ORDER — INSULIN ASPART 100 UNIT/ML ~~LOC~~ SOLN
0.0000 [IU] | Freq: Every day | SUBCUTANEOUS | Status: DC
Start: 2017-03-26 — End: 2017-03-26

## 2017-03-26 MED ORDER — OFF THE BEAT BOOK: Freq: Once | Status: DC

## 2017-03-26 MED ORDER — CARVEDILOL 25 MG PO TABS
25.0000 mg | ORAL_TABLET | Freq: Once | ORAL | Status: AC
  Administered 2017-03-26: 25 mg via ORAL
  Filled 2017-03-26: qty 1

## 2017-03-26 MED ORDER — INSULIN ASPART 100 UNIT/ML ~~LOC~~ SOLN
4.0000 [IU] | Freq: Three times a day (TID) | SUBCUTANEOUS | Status: DC
  Administered 2017-03-26 – 2017-03-27 (×3): 4 [IU] via SUBCUTANEOUS
  Filled 2017-03-26 (×3): qty 1

## 2017-03-26 MED ORDER — INSULIN ASPART 100 UNIT/ML ~~LOC~~ SOLN
0.0000 [IU] | Freq: Three times a day (TID) | SUBCUTANEOUS | Status: DC
  Administered 2017-03-26: 9 [IU] via SUBCUTANEOUS
  Administered 2017-03-26: 3 [IU] via SUBCUTANEOUS
  Filled 2017-03-26 (×2): qty 1

## 2017-03-26 MED ORDER — ONDANSETRON HCL 4 MG/2ML IJ SOLN: 4.0000 mg | Freq: Four times a day (QID) | INTRAMUSCULAR | Status: DC | PRN

## 2017-03-26 MED ORDER — ACETAMINOPHEN 325 MG PO TABS: 650.0000 mg | ORAL_TABLET | Freq: Four times a day (QID) | ORAL | Status: DC | PRN

## 2017-03-26 MED ORDER — PREDNISONE 20 MG PO TABS: 20.0000 mg | ORAL_TABLET | Freq: Every day | ORAL | Status: DC

## 2017-03-26 MED ORDER — IPRATROPIUM-ALBUTEROL 0.5-2.5 (3) MG/3ML IN SOLN
3.0000 mL | Freq: Once | RESPIRATORY_TRACT | Status: AC
Start: 2017-03-26 — End: 2017-03-26
  Administered 2017-03-26: 3 mL via RESPIRATORY_TRACT
  Filled 2017-03-26: qty 3

## 2017-03-26 NOTE — Progress Notes (Addendum)
He patient is a sleepy, he has no complaints. Vital signs are stable. On Cardizem drip and xarelto. Heart rate is controlled this am but RVR later. Physical examinations is unremarkable except irregular heart rate. Continue current treatment, follow-up cardiologist for further recommendation.  I discussed with cardiology PA, Ms. Rosalita Levan.

## 2017-03-26 NOTE — Progress Notes (Signed)
Patient admitted w/ aflutter w/ QTc of 627 msec. Azithromycin was ordered, but considering risk of further QTc prolongation spoke to MD to switch to doxycycline.  MD notified and agrees with plan.  Will switch to doxycycline 100 mg PO bid.  Thomasene Ripple, PharmD, BCPS Clinical Pharmacist 03/26/2017

## 2017-03-26 NOTE — H&P (Addendum)
[name] is an 61 y.o. male.   Chief Complaint: shortness of breath HPI: the patient with past medical history of COPD, CAD and hypertension presents to the emergency department complaining of shortness of breath. The patient states that he has had a cough with white sputum production for the last 2 weeks. He's been unable to catch his breath tonight. He admits to chest pain with coughing but continued to have some chest pain particularly as his heart rate increased with breathing treatments. His respiratory status improved but his heart rate remained high. The patient has episodes of atrial fibrillation and was found to be in atrial fib/flutter with rapid ventricular rate which prompted the emergency department staff to call the hospitalist service for admission.  Past Medical History:  Diagnosis Date  . CAD in native artery    a. stress echo 12/2007 abnl, EF > 55%, b. LHC 01/28/08: mLAD 30, D1 40, dLCx 92, pRCA 30, mRCA 70, mRCA lesion 2 80, PDA 90, s/p PCI/BMS to prox and distal RCA, s/p PCI/BMS to PDA; c. patient reports PCI/stenting x 2 in early 2017 at the [place] (no records on file) d. 06/2016: cath showing patent stents along RCA and LCx with moderate 40% stenosis along the LAD.   [name] Chronic combined systolic (congestive) and diastolic (congestive) heart failure ([place])    a. echo 2009: > 55% b. 06/2016: EF 25-30% by echo in 06/2016, cath showing patent stents with moderate disease along LAD  . COPD (chronic obstructive pulmonary disease) ([place])   . Gout   . Hyperlipidemia   . Hypertensive heart disease   . Obesity   . Tobacco abuse     Past Surgical History:  Procedure Laterality Date  . CARDIAC CATHETERIZATION  2009   [facility];   . CARDIAC CATHETERIZATION  2010  . CARDIAC CATHETERIZATION N/A 07/28/2016   Procedure: Right and Left Heart Cath and possible PCI;  Surgeon: Wellington Hampshire, MD;  Location: [place] [facility];  Service: Cardiovascular;  Laterality: N/A;  . CORONARY ANGIOPLASTY   2009   s/p stent placement at [facility].    Family History  Problem Relation Age of Onset  . Heart attack Mother 38   Social History:  reports that he has quit smoking. His smoking use included Cigarettes. He has a 35.00 pack-year smoking history. He has never used smokeless tobacco. He reports that he does not drink alcohol or use drugs.  Allergies:  Allergies  Allergen Reactions  . Aspirin Other (See Comments)    Told not to take because of blood thinner    Medications Prior to Admission  Medication Sig Dispense Refill  . albuterol-ipratropium (COMBIVENT) 18-103 MCG/ACT inhaler Inhale 2 puffs into the lungs every 4 (four) hours as needed for wheezing.    [name] atorvastatin (LIPITOR) 40 MG tablet Take 40 mg by mouth daily.     . budesonide-formoterol (SYMBICORT) 160-4.5 MCG/ACT inhaler Inhale 2 puffs into the lungs 2 (two) times daily.    . carvedilol (COREG) 25 MG tablet Take 1 tablet (25 mg total) by mouth 2 (two) times daily with a meal. 60 tablet 0  . docusate sodium (COLACE) 100 MG capsule Take 100 mg by mouth 2 (two) times daily.    [name] losartan (COZAAR) 25 MG tablet Take 1 tablet (25 mg total) by mouth daily. 30 tablet 1  . metFORMIN (GLUCOPHAGE) 1000 MG tablet Take 1,000 mg by mouth 2 (two) times daily with a meal.     . nitroGLYCERIN (NITROSTAT) 0.4  MG SL tablet Place 0.4 mg under the tongue every 5 (five) minutes as needed for chest pain.    . rivaroxaban (XARELTO) 20 MG TABS tablet Take 1 tablet (20 mg total) by mouth daily with supper. 30 tablet 0  . spironolactone (ALDACTONE) 25 MG tablet Take 1 tablet (25 mg total) by mouth daily. 30 tablet 0  . torsemide (DEMADEX) 20 MG tablet Take 2 tablets (40 mg total) by mouth daily. 60 tablet 0  . traZODone (DESYREL) 50 MG tablet Take 50 mg by mouth at bedtime.      Results for orders placed or performed during the hospital encounter of 03/26/17 (from the past 48 hour(s))  Basic metabolic panel     Status: Abnormal   Collection Time:  03/26/17  1:46 AM  Result Value Ref Range   Sodium 140 135 - 145 mmol/L   Potassium 3.7 3.5 - 5.1 mmol/L   Chloride 103 101 - 111 mmol/L   CO2 29 22 - 32 mmol/L   Glucose, Bld 122 (H) 65 - 99 mg/dL   BUN 18 6 - 20 mg/dL   Creatinine, Ser 1.01 0.61 - 1.24 mg/dL   Calcium 9.9 8.9 - 10.3 mg/dL   GFR calc non Af Amer >60 >60 mL/min   GFR calc Af Amer >60 >60 mL/min    Comment: (NOTE) The eGFR has been calculated using the CKD EPI equation. This calculation has not been validated in all clinical situations. eGFR's persistently <60 mL/min signify possible Chronic Kidney Disease.    Anion gap 8 5 - 15  CBC     Status: Abnormal   Collection Time: 03/26/17  1:46 AM  Result Value Ref Range   WBC 9.3 3.8 - 10.6 K/uL   RBC 5.00 4.40 - 5.90 MIL/uL   Hemoglobin 13.6 13.0 - 18.0 g/dL   HCT 41.7 40.0 - 52.0 %   MCV 83.4 80.0 - 100.0 fL   MCH 27.3 26.0 - 34.0 pg   MCHC 32.7 32.0 - 36.0 g/dL   RDW 21.5 (H) 11.5 - 14.5 %   Platelets 206 150 - 440 K/uL  Troponin I     Status: Abnormal   Collection Time: 03/26/17  1:46 AM  Result Value Ref Range   Troponin I 0.07 (HH) <0.03 ng/mL    Comment: CRITICAL RESULT CALLED TO, READ BACK BY AND VERIFIED WITH KAILEY WALKER @ [id number] ON 03/26/2017 BY [facility]F   Brain natriuretic peptide     Status: Abnormal   Collection Time: 03/26/17  1:46 AM  Result Value Ref Range   B Natriuretic Peptide 202.0 (H) 0.0 - 100.0 pg/mL  Troponin I     Status: Abnormal   Collection Time: 03/26/17  4:22 AM  Result Value Ref Range   Troponin I 0.06 (HH) <0.03 ng/mL    Comment: CRITICAL RESULT CALLED TO, READ BACK BY AND VERIFIED WITH: KAILEY WALKER @ [id number] ON 03/26/2017 BY CAF    Dg Chest Portable 1 View  Result Date: 03/26/2017 CLINICAL DATA:  61 y/o  M; chest pain. EXAM: PORTABLE CHEST 1 VIEW COMPARISON:  02/18/2017 chest radiograph FINDINGS: Stable moderate cardiomegaly. Aortic atherosclerosis with calcification. Clear lungs. No pleural effusion or pneumothorax. No acute  osseous abnormality is evident. IMPRESSION: Stable moderate cardiomegaly.  Aortic atherosclerosis.  Clear lungs. Electronically Signed   By: Kristine Garbe M.D.   On: 03/26/2017 02:03    Review of Systems  Constitutional: Negative for chills and fever.  HENT: Negative for sore throat and tinnitus.  Eyes: Negative for blurred vision and redness.  Respiratory: Positive for cough, sputum production, shortness of breath and wheezing.   Cardiovascular: Negative for chest pain, palpitations, orthopnea and PND.  Gastrointestinal: Negative for abdominal pain, diarrhea, nausea and vomiting.  Genitourinary: Negative for dysuria, frequency and urgency.  Musculoskeletal: Negative for joint pain and myalgias.  Skin: Negative for rash.       No lesions  Neurological: Negative for speech change, focal weakness and weakness.  Endo/Heme/Allergies: Does not bruise/bleed easily.       No temperature intolerance  Psychiatric/Behavioral: Negative for depression and suicidal ideas.    Blood pressure (!) 142/70, pulse 72, temperature 97.7 F (36.5 C), temperature source Oral, resp. rate (!) 24, height _0  (1.803 m), weight 92.5 kg (204 lb), SpO2 98 %. Physical Exam  Constitutional: He is oriented to person, place, and time. He appears well-developed and well-nourished. No distress.  HENT:  Head: Normocephalic and atraumatic.  Mouth/Throat: Oropharynx is clear and moist.  Eyes: Pupils are equal, round, and reactive to light. Conjunctivae and EOM are normal. No scleral icterus.  Neck: Normal range of motion. Neck supple. No JVD present. No tracheal deviation present. No thyromegaly present.  Cardiovascular: An irregularly irregular rhythm present. Tachycardia present.  Exam reveals no gallop and no friction rub.   No murmur heard. GI: Soft. Bowel sounds are normal. He exhibits no distension. There is no tenderness.  Genitourinary:  Genitourinary Comments: Deferred  Musculoskeletal: Normal  range of motion. He exhibits no edema.  Lymphadenopathy:    He has no cervical adenopathy.  Neurological: He is alert and oriented to person, place, and time. No cranial nerve deficit.  Skin: Skin is warm and dry. No rash noted. No erythema.  Psychiatric: He has a normal mood and affect. His behavior is normal. Judgment and thought content normal.     Assessment/Plan This is a 61 year old male admitted for atrial fibrillation/flutter with rapid ventricular rate 1. A. Fib/flutter: With rapid ventricular rate and associated chest pain.pain is atypical however the patient may have some demand ischemia from persistent tachycardia. Notably his heart rate will rapidly decrease at times. Range is 70-140 bpm. The patient has not consistently responded to metoprolol and Coreg given in the emergency department. Thus I place him on a diltiazem drip. Continue to monitor telemetry. Follow troponin. 2. COPD: Exacerbation; wheezing and air movement improved. Schedule DuoNeb treatments and taper steroids. Due to multiple QT prolonging agents we will use doxycycline for anti-inflammatory effect instead of azithromycin. Continue inhaled corticosteroid 3. Hypertension: intermittent control. Continue losartan and carvedilol  4. Elevated troponin: Likely secondary to demand. No EKG ischemic changes. Continue to follow cardiac biomarkers. Monitor telemetry. 5. CHF: systolic; chronic (appears to have resolved based on most recent echo which shows improved EF of >55%). Continue Lasix and spironolactone per home regimen particularly if cardiac output declines with persistent tachycardia 6. Diabetes mellitus type 2: Hold metformin; sliding scale insulin while hospitalized  7. DVT prophylaxis: Xarelto 8. GI prophylaxis: None The patient is a full code. Time spent on admission orders and patient care approximately 45 minutes  Harrie Foreman, MD 03/26/2017, 7:13 AM

## 2017-03-26 NOTE — ED Provider Notes (Signed)
[facility] Emergency Department Provider Note  ____________________________________________  Time seen: Approximately 2:36 AM  I have reviewed the triage vital signs and the nursing notes.   HISTORY  Chief Complaint Chest Pain   HPI [name] is a 61 y.o. male with a history of COPD, CAD, aflutter, CHF with an EF of 20-25%, former tobacco use, hypertension, hyperlipidemia who presents for evaluation of chest pain and shortness of breath. Patient reports that he was getting ready to go to bed an hour prior to arrival to the emergency room when he developed tightness in the center of his chest and shortness of breath.patient reports a cough productive of yellow phlegm, congestion, nausea, and vomiting for the last few days. He no longer smokes. He describes the chest pain as a tightness, located in the center of his chest, moderate, nonradiating. He denies diaphoresis or lightheadedness. He received nitroglycerin per EMS with no changes in his pain. Patient currently endorsing moderate chest tightness. He denies weight gain or swelling of his lower extremities. Endorses compliance with his medications.  Past Medical History:  Diagnosis Date  . CAD in native artery    a. stress echo 12/2007 abnl, EF > 55%, b. LHC 01/28/08: mLAD 30, D1 40, dLCx 70, pRCA 30, mRCA 70, mRCA lesion 2 80, PDA 90, s/p PCI/BMS to prox and distal RCA, s/p PCI/BMS to PDA; c. patient reports PCI/stenting x 2 in early 2017 at the [place] (no records on file) d. 06/2016: cath showing patent stents along RCA and LCx with moderate 40% stenosis along the LAD.   [name] Chronic combined systolic (congestive) and diastolic (congestive) heart failure ([facility])    a. echo 2009: > 55% b. 06/2016: EF 25-30% by echo in 06/2016, cath showing patent stents with moderate disease along LAD  . COPD (chronic obstructive pulmonary disease) (H[facility])   . Gout   . Hyperlipidemia   . Hypertensive heart disease   . Obesity   .  Tobacco abuse     Patient Active Problem List   Diagnosis Date Noted  . Atrial flutter (HCC) 02/18/2017  . Diastolic CHF, acute on chronic (HCC) 01/06/2017  . CAD in native artery   . Chest pain 01/05/2017  . Ischemic cardiomyopathy 07/26/2016  . Non-sustained ventricular tachycardia (HCC) 07/26/2016  . COPD (chronic obstructive pulmonary disease) (HCC) 07/24/2016  . CAD (coronary artery disease) 07/24/2016  . HLD (hyperlipidemia) 07/24/2016  . Acute on chronic combined systolic and diastolic CHF (congestive heart failure) (HCC) 07/24/2016  . Acute respiratory distress 07/24/2016  . Tobacco abuse 07/24/2016  . Accelerated hypertension 07/24/2016  . Hypertensive heart disease 07/24/2016    Past Surgical History:  Procedure Laterality Date  . CARDIAC CATHETERIZATION  2009   [facility];   . CARDIAC CATHETERIZATION  2010  . CARDIAC CATHETERIZATION N/A 07/28/2016   Procedure: Right and Left Heart Cath and possible PCI;  Surgeon: Iran Ouch, MD;  Location: [facility] INVASIVE [facility];  Service: Cardiovascular;  Laterality: N/A;  . CORONARY ANGIOPLASTY  2009   s/p stent placement at [facility].    Prior to Admission medications   Medication Sig Start Date End Date Taking? Authorizing Provider  albuterol-ipratropium (COMBIVENT) 18-103 MCG/ACT inhaler Inhale 2 puffs into the lungs every 4 (four) hours as needed for wheezing.    [provider]  atorvastatin (LIPITOR) 40 MG tablet Take 20 mg by mouth daily.    [provider]  budesonide-formoterol (SYMBICORT) 160-4.5 MCG/ACT inhaler Inhale 2 puffs into the lungs 2 (  two) times daily.    [provider]  carvedilol (COREG) 25 MG tablet Take 1 tablet (25 mg total) by mouth 2 (two) times daily with a meal. 07/31/16   Enedina Finner, MD  docusate sodium (COLACE) 100 MG capsule Take 100 mg by mouth 2 (two) times daily.    [provider]  losartan (COZAAR) 25 MG tablet Take 1 tablet (25 mg total) by mouth daily. 01/08/17    Enedina Finner, MD  metFORMIN (GLUCOPHAGE) 1000 MG tablet Take 1,000 mg by mouth daily with breakfast.     [provider]  nicotine (NICODERM CQ - DOSED IN MG/24 HR) 7 mg/24hr patch Place 1 patch onto the skin daily.    [provider]  nitroGLYCERIN (NITROSTAT) 0.4 MG SL tablet Place 0.4 mg under the tongue every 5 (five) minutes as needed for chest pain.    [provider]  rivaroxaban (XARELTO) 20 MG TABS tablet Take 1 tablet (20 mg total) by mouth daily with supper. 02/19/17   Adrian Saran, MD  spironolactone (ALDACTONE) 25 MG tablet Take 1 tablet (25 mg total) by mouth daily. 08/01/16   Enedina Finner, MD  torsemide (DEMADEX) 20 MG tablet Take 2 tablets (40 mg total) by mouth daily. 01/07/17   Enedina Finner, MD  traZODone (DESYREL) 50 MG tablet Take 50 mg by mouth at bedtime.    [provider]    Allergies Aspirin  Family History  Problem Relation Age of Onset  . Heart attack Mother 21    Social History Social History  Substance Use Topics  . Smoking status: Former Smoker    Packs/day: 1.00    Years: 35.00    Types: Cigarettes  . Smokeless tobacco: Never Used  . Alcohol use No     Comment: Quit 8 yrs.  Used to drink heavily    Review of Systems  Constitutional: Negative for fever. Eyes: Negative for visual changes. ENT: Negative for sore throat. Neck: No neck pain  Cardiovascular: + chest pain. Respiratory: + shortness of breath. Gastrointestinal: Negative for abdominal pain, vomiting or diarrhea. Genitourinary: Negative for dysuria. Musculoskeletal: Negative for back pain. Skin: Negative for rash. Neurological: Negative for headaches, weakness or numbness. Psych: No SI or HI  ____________________________________________   PHYSICAL EXAM:  VITAL SIGNS: ED Triage Vitals  Enc Vitals Group     BP 03/26/17 0145 127/72     Pulse Rate 03/26/17 0145 82     Resp 03/26/17 0145 (!) 21     Temp 03/26/17 0145 98.5 F (36.9 C)     Temp Source  03/26/17 0145 Oral     SpO2 03/26/17 0145 99 %     Weight 03/26/17 0142 204 lb (92.5 kg)     Height 03/26/17 0142  (1.803 m)     Head Circumference --      Peak Flow --      Pain Score 03/26/17 0142 8     Pain Loc --      Pain Edu? --      Excl. in GC? --     Constitutional: Alert and oriented. Well appearing and in no apparent distress. HEENT:      Head: Normocephalic and atraumatic.         Eyes: Conjunctivae are normal. Sclera is non-icteric.       Mouth/Throat: Mucous membranes are moist.       Neck: Supple with no signs of meningismus. Cardiovascular: Regular rate and rhythm. No murmurs, gallops, or rubs.  2+ symmetrical distal pulses are present in all extremities. No JVD. Respiratory: Normal respiratory effort. Severely diminished air movement bilaterally with no wheezes, crackles, or rhonchi.  Gastrointestinal: Soft, non tender, and non distended with positive bowel sounds. No rebound or guarding. Genitourinary: No CVA tenderness. Musculoskeletal: Nontender with normal range of motion in all extremities. No edema, cyanosis, or erythema of extremities. Neurologic: Normal speech and language. Face is symmetric. Moving all extremities. No gross focal neurologic deficits are appreciated. Skin: Skin is warm, dry and intact. No rash noted. Psychiatric: Mood and affect are normal. Speech and behavior are normal.  ____________________________________________   LABS (all labs ordered are listed, but only abnormal results are displayed)  Labs Reviewed  BASIC METABOLIC PANEL - Abnormal; Notable for the following:       Result Value   Glucose, Bld 122 (*)    All other components within normal limits  CBC - Abnormal; Notable for the following:    RDW 21.5 (*)    All other components within normal limits  TROPONIN I - Abnormal; Notable for the following:    Troponin I 0.07 (*)    All other components within normal limits  BRAIN NATRIURETIC PEPTIDE - Abnormal; Notable for the  following:    B Natriuretic Peptide 202.0 (*)    All other components within normal limits  TROPONIN I   ____________________________________________  EKG  ED ECG REPORT I, Nita Sickle, the attending physician, personally viewed and interpreted this ECG.  Atrial flutter with varied AV block, prolonged QTC, normal axis, T-wave inversions in lateral leads, no ST elevations or depressions.unchanged from prior from August 2018 ____________________________________________  RADIOLOGY  CXR:  Stable moderate cardiomegaly. Aortic atherosclerosis. Clear lungs ____________________________________________   PROCEDURES  Procedure(s) performed: None Procedures Critical Care performed: yes  CRITICAL CARE Performed by: Nita Sickle  ?  Total critical care time: 40 min  Critical care time was exclusive of separately billable procedures and treating other patients.  Critical care was necessary to treat or prevent imminent or life-threatening deterioration.  Critical care was time spent personally by me on the following activities: development of treatment plan with patient and/or surrogate as well as nursing, discussions with consultants, evaluation of patient's response to treatment, examination of patient, obtaining history from patient or surrogate, ordering and performing treatments and interventions, ordering and review of laboratory studies, ordering and review of radiographic studies, pulse oximetry and re-evaluation of patient's condition.  ____________________________________________   INITIAL IMPRESSION / ASSESSMENT AND PLAN / ED COURSE  61 y.o. male with a history of COPD, CAD, CHF with an EF of 20-25%, aflutter on Xarelto, former tobacco use, hypertension, hyperlipidemia who presents for evaluation of chest tightness and shortness of breath in the setting of one week of cold symptoms. patient is well-appearing, in no distress, vital signs show slightly tachypnea  was satting well on room air, patient has severely diminished air movement bilaterally with no crackles or wheezes. Patient looks euvolemic on exam. EKG showing A. flutter with no evidence of ischemia. CXR with no evidence of PNA or pulmonary edema. Labs pending. Will treat for COPD exacerbation with duoneb, steroids, and abx. Will cycle cardiac enzymes. No ASA given due to allergy.  ----------------------------------------- 2:15 AM on 03/26/2017 ----------------------------------------- OBSERVATION CARE: This patient is being placed under observation care for the following reasons: Chest pain with repeat testing to rule out ischemia  _________________________ 3:49 AM on 03/26/2017 -----------------------------------------  slightly improvement on lung aeration however still tight with significant wheezing. We'll give  2 more DuoNeb abscess and reassess. Patient's a flutter is now with increase rate probably due to the albuterol. We'll give a dose of IV metoprolol.   ----------------------------------------- 4:31 AM on 03/26/2017 -----------------------------------------  END OF OBSERVATION STATUS: After an appropriate period of observation, this patient is being admitted due to the following reason(s):  patient continues to have significant wheezing after 4 breathing treatments. Flutter with RVR unchanged after IV metoprolol. We'll give the morning dose of Coreg and give patient a dose of diltiazem. Patient will need to be admitted to the hospital for COPD exacerbation and flutter with RVR.    Pertinent labs & imaging results that were available during my care of the patient were reviewed by me and considered in my medical decision making (see chart for details).    ____________________________________________   FINAL CLINICAL IMPRESSION(S) / ED DIAGNOSES  Final diagnoses:  Atrial flutter with rapid ventricular response (HCC)  COPD with acute exacerbation (HCC)      NEW  MEDICATIONS STARTED DURING THIS VISIT:  New Prescriptions   No medications on file     Note:  This document was prepared using Dragon voice recognition software and may include unintentional dictation errors.    Don Perking, [place], MD 03/26/17 [phone]

## 2017-03-26 NOTE — Plan of Care (Signed)
Problem: Safety: Goal: Ability to remain free from injury will improve Outcome: Progressing Bed in lowest position, call bell within reach, bed alarm, yellow socks, educated pt on the importance to call nursing staff due to pt being on cardizem drip.pt verbalized understanding

## 2017-03-26 NOTE — Progress Notes (Signed)
Inpatient Diabetes Program Recommendations  AACE/ADA: New Consensus Statement on Inpatient Glycemic Control (2015)  Target Ranges:  Prepandial:   less than 140 mg/dL      Peak postprandial:   less than 180 mg/dL (1-2 hours)      Critically ill patients:  140 - 180 mg/dL   Lab Results  Component Value Date   GLUCAP 379 (H) 03/26/2017   HGBA1C 7.3 (H) 01/05/2017    Review of Glycemic Control Results for TREVONTAE, LINDAHL (MRN [id number]) as of 03/26/2017 12:03  Ref. Range 03/26/2017 07:17 03/26/2017 11:04 03/26/2017 11:05  Glucose-Capillary Latest Ref Range: 65 - 99 mg/dL 409 (H) 811 (H) 914 (H)     Diabetes history: Type 2 Outpatient Diabetes medications: Metformin  bid Current orders for Inpatient glycemic control: Novolog 0-9 units tid, Novolog 0-5 units qhs  Inpatient Diabetes Program Recommendations:  Consider adding mealtime insulin while patient is on steroids, consider Novolog 4 units tid with meals  and increase Novolog correction to resistant 0-20 units tid.   Text paged Dr. Anastasio Champion, RN, BA, MHA, CDE Diabetes Coordinator Inpatient Diabetes Program  [phone] (Team Pager) [phone] [facility] Office) 03/26/2017 12:07 PM

## 2017-03-26 NOTE — Progress Notes (Signed)
Chaplain responded to an OR for Advanced Directive. [place] met pt, but pt was too sleepy to have an interaction with [place] at the time for this visit. Pt equested [facility] to return later. [place] will follow up with pt to educate on AD.    03/26/17 1000  Clinical Encounter Type  Visited With Patient;Health care provider  Visit Type Initial  Referral From Nurse  Consult/Referral To Chaplain  Spiritual Encounters  Spiritual Needs Literature

## 2017-03-26 NOTE — ED Triage Notes (Signed)
Pt arrived via ems from home with complaints of a throbbing chest pain that began roughly an hour ago. Pt was given 1 spray of NTG in route with no decrease in pain. Pt alert and oriented x 4. Pt reports a productive cough for "a few weeks."

## 2017-03-26 NOTE — Consult Note (Signed)
Cardiology Consult    Patient ID: [name]; [id number]; [birth year]   Admit date: 03/26/2017 Date of Consult: 03/26/2017  Primary Care Provider: System, Pcp Not In Primary Cardiologist: [facility]  Patient Profile    [name] is a 61 y.o. male with past medical history of of CAD (s/p BMS to dRCA and BMS to PDA in 2009, reported stenting in 2017 at the [place] - no records on file, cath in 06/2016 showing patent stents along RCA and LCx), chronic combined systolic and diastolic CHF (EF 40-98% by echo in 01/2017), PAF (on Xarelto), hypertensive heart disease, COPD, HTN, and HLD who is being seen today for the evaluation of atrial fibrillation with RVR at the request of Dr. Imogene Burn.   History of Present Illness    [name] was recently admitted to [facility] in 01/2017 for worsening dyspnea and found to be in atrial fibrillation with RVR. He was started on Xarelto  daily for anticoagulation along with being continued on Coreg  BID for rate-control.   He presented to [facility] on 03/26/2017 for evaluation of chest pain and a productive cough. He reports being in his usual state of health until yesterday when he developed palpitations and a discomfort in his chest. Reports associated dyspnea with a productive cough and yellow sputum. Weights have been stable between 205 - 208 lbs on his home scales and he denies any recent orthopnea, PND, or lower extremity edema. Has noticed occasional palpitations over the past month which occur when he over exerts himself. Reports good compliance with his Xarelto and denies any evidence of active bleeding.    Upon arrival to the ED, he was found to be in atrial flutter with RVR, HR variable in the 80's to low-100's. Labs showed a WBC of 9.3, Hgb 13.6, platelets 206, Na+ 140, K+ 3.7, creatinine 1.01. BNP 202. Initial troponin 0.07 with repeat value of 0.06. CXR showed no acute cardiopulmonary disease. He was therefore admitted for further treatment of atrial  flutter along with his COPD exacerbation. Has been started on Doxycycline along with nebulizer treatments and a steroid taper. Placed on IV Cardizem (at 7.5 mg/hr) to assist with HR control, with rates currently in the 70's to 120's.   Past Medical History   Past Medical History:  Diagnosis Date  . CAD in native artery    a. stress echo 12/2007 abnl, EF > 55%, b. LHC 01/28/08: mLAD 30, D1 40, dLCx 70, pRCA 30, mRCA 70, mRCA lesion 2 80, PDA 90, s/p PCI/BMS to prox and distal RCA, s/p PCI/BMS to PDA; c. patient reports PCI/stenting x 2 in early 2017 at the [place] (no records on file) d. 06/2016: cath showing patent stents along RCA and LCx with moderate 40% stenosis along the LAD.   [name] Chronic combined systolic (congestive) and diastolic (congestive) heart failure (HCC)    a. echo 2009: > 55% b. 06/2016: EF 25-30% by echo in 06/2016, cath showing patent stents with moderate disease along LAD  . COPD (chronic obstructive pulmonary disease) (HCC)   . Gout   . Hyperlipidemia   . Hypertensive heart disease   . Obesity   . Tobacco abuse      Allergies:   Allergies  Allergen Reactions  . Aspirin Other (See Comments)    Told not to take because of blood thinner    Home Medications:   Home Medications:  Prior to Admission medications   Medication Sig Start Date End Date Taking? Authorizing Provider  albuterol-ipratropium (COMBIVENT) 18-103 MCG/ACT inhaler Inhale 2 puffs into the lungs every 4 (four) hours as needed for wheezing.   Yes [provider]  atorvastatin (LIPITOR) 40 MG tablet Take 40 mg by mouth daily.    Yes [provider]  budesonide-formoterol (SYMBICORT) 160-4.5 MCG/ACT inhaler Inhale 2 puffs into the lungs 2 (two) times daily.   Yes [provider]  carvedilol (COREG) 25 MG tablet Take 1 tablet (25 mg total) by mouth 2 (two) times daily with a meal. 07/31/16  Yes Enedina Finner, MD  docusate sodium (COLACE) 100 MG capsule Take 100 mg by mouth 2 (two) times  daily.   Yes [provider]  losartan (COZAAR) 25 MG tablet Take 1 tablet (25 mg total) by mouth daily. 01/08/17  Yes Enedina Finner, MD  metFORMIN (GLUCOPHAGE) 1000 MG tablet Take 1,000 mg by mouth 2 (two) times daily with a meal.    Yes [provider]  nitroGLYCERIN (NITROSTAT) 0.4 MG SL tablet Place 0.4 mg under the tongue every 5 (five) minutes as needed for chest pain.   Yes [provider]  rivaroxaban (XARELTO) 20 MG TABS tablet Take 1 tablet (20 mg total) by mouth daily with supper. 02/19/17  Yes Adrian Saran, MD  spironolactone (ALDACTONE) 25 MG tablet Take 1 tablet (25 mg total) by mouth daily. 08/01/16  Yes Enedina Finner, MD  torsemide (DEMADEX) 20 MG tablet Take 2 tablets (40 mg total) by mouth daily. 01/07/17  Yes Enedina Finner, MD  traZODone (DESYREL) 50 MG tablet Take 50 mg by mouth at bedtime.   Yes [provider]    Inpatient Medications    Scheduled Meds: . atorvastatin  40 mg Oral Daily  . carvedilol  25 mg Oral BID WC  . docusate sodium  100 mg Oral BID  . doxycycline  100 mg Oral Q12H  . [START ON 03/27/2017] Influenza vac split quadrivalent PF  0.5 mL Intramuscular Tomorrow-1000  . insulin aspart  0-5 Units Subcutaneous QHS  . insulin aspart  0-9 Units Subcutaneous TID WC  . ipratropium-albuterol  3 mL Nebulization Q6H  . ipratropium-albuterol      . losartan  25 mg Oral Daily  . mometasone-formoterol  2 puff Inhalation BID  . [START ON 03/27/2017] predniSONE  40 mg Oral Q breakfast   Followed by  . [START ON 03/28/2017] predniSONE  30 mg Oral Q breakfast   Followed by  . [START ON 03/29/2017] predniSONE  20 mg Oral Q breakfast   Followed by  . [START ON 03/30/2017] predniSONE  10 mg Oral Q breakfast   Followed by  . [START ON 03/31/2017] predniSONE  5 mg Oral Q breakfast  . rivaroxaban  20 mg Oral Q supper  . spironolactone  25 mg Oral Daily  . torsemide  40 mg Oral Daily  . traZODone  50 mg Oral QHS   Continuous Infusions: . diltiazem  (CARDIZEM) infusion 5 mg/hr (03/26/17 0657)   PRN Meds: acetaminophen **OR** acetaminophen, nitroGLYCERIN, ondansetron **OR** ondansetron (ZOFRAN) IV  Family History    Family History  Problem Relation Age of Onset  . Heart attack Mother 42    Social History    Social History   Social History  . Marital status: Married    Spouse name: N/A  . Number of children: N/A  . Years of education: N/A   Occupational History  . Not on file.   Social History Main Topics  . Smoking status: Former Smoker    Packs/day:  1.00    Years: 35.00    Types: Cigarettes  . Smokeless tobacco: Never Used  . Alcohol use No     Comment: Quit 8 yrs.  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 Drug use: No  . Sexual activity: Not on file   Other Topics Concern  . Not on file   Social History Narrative  . No narrative on file     Review of Systems    General:  No chills, fever, night sweats or weight changes.  Cardiovascular:  No chest pain, edema, orthopnea, paroxysmal nocturnal dyspnea. Positive for dyspnea on exertion and palpitations.  Dermatological: No rash, lesions/masses Respiratory: No cough, Positive for dyspnea. Urologic: No hematuria, dysuria Abdominal:   No nausea, vomiting, diarrhea, bright red blood per rectum, melena, or hematemesis Neurologic:  No visual changes, wkns, changes in mental status. All other systems reviewed and are otherwise negative except as noted above.  Physical Exam/Data    Blood pressure (!) 150/69, pulse 84, temperature 97.7 F (36.5 C), temperature source Oral, resp. rate (!) 24, height  (1.803 m), weight 204 lb (92.5 kg), SpO2 100 %.  General: Pleasant, African American male appearing in NAD Psych: Normal affect. Neuro: Alert and oriented X 3. Moves all extremities spontaneously. HEENT: Normal  Neck: Supple without bruits or JVD. Lungs:  Resp regular and unlabored, rhonchi appreciated bilaterally. Heart: Irregularly irregular no s3, s4, or  murmurs. Abdomen: Soft, non-tender, non-distended, BS + x 4.  Extremities: No clubbing, cyanosis or edema. DP/PT/Radials 2+ and equal bilaterally.   EKG:  The EKG was personally reviewed and demonstrates:  Atrial flutter, HR 75, with prolonged QT.  Telemetry:  Telemetry was personally reviewed and demonstrates: Atrial flutter, HR variable in 70's to 120's.    Labs/Studies     Relevant CV Studies:  Echocardiogram: 02/19/2017 Study Conclusions  - Left ventricle: The cavity size was normal. There was moderate   concentric hypertrophy. Systolic function was severely reduced.   The estimated ejection fraction was in the range of 25% to 30%.   Diffuse hypokinesis. Regional wall motion abnormalities cannot be   excluded. The study is not technically sufficient to allow   evaluation of LV diastolic function. - Mitral valve: There was mild to moderate regurgitation. - Left atrium: The atrium was mildly to moderately dilated. - Right ventricle: Systolic function was moderately reduced. - Pulmonary arteries: PA peak pressure: 36 mm Hg (S).  Impressions:  - Rhythm is atrial flutter.  Laboratory Data:  Chemistry Recent Labs Lab 03/26/17 0146  NA 140  K 3.7  CL 103  CO2 29  GLUCOSE 122*  BUN 18  CREATININE 1.01  CALCIUM 9.9  GFRNONAA >60  GFRAA >60  ANIONGAP 8    No results for input(s): PROT, ALBUMIN, AST, ALT, ALKPHOS, BILITOT in the last 168 hours. Hematology Recent Labs Lab 03/26/17 0146  WBC 9.3  RBC 5.00  HGB 13.6  HCT 41.7  MCV 83.4  MCH 27.3  MCHC 32.7  RDW 21.5*  PLT 206   Cardiac Enzymes Recent Labs Lab 03/26/17 0146 03/26/17 0422  TROPONINI 0.07* 0.06*   No results for input(s): TROPIPOC in the last 168 hours.  BNP Recent Labs Lab 03/26/17 0146  BNP 202.0*    DDimer No results for input(s): DDIMER in the last 168 hours.  Radiology/Studies:  Dg Chest Portable 1 View  Result Date: 03/26/2017 CLINICAL DATA:  61 y/o  M; chest pain. EXAM:  PORTABLE CHEST 1 VIEW COMPARISON:  02/18/2017 chest  radiograph FINDINGS: Stable moderate cardiomegaly. Aortic atherosclerosis with calcification. Clear lungs. No pleural effusion or pneumothorax. No acute osseous abnormality is evident. IMPRESSION: Stable moderate cardiomegaly.  Aortic atherosclerosis.  Clear lungs. Electronically Signed   By: Mitzi Hansen M.D.   On: 03/26/2017 02:03    Assessment & Plan    1. Persistent Atrial Flutter with RVR - initially diagnosed with atrial flutter in 01/2017 and started on Xarelto at that time. He reports intermittent palpitations for the past month but says he developed acute worsening of his symptoms last night.  -  Na+ 140, K+ 3.7, creatinine 1.01. BNP 202. Initial troponin 0.07 with repeat value of 0.06. CXR showed no acute cardiopulmonary disease.  - he has been started on IV Cardizem (at 7.5 mg/hr) to assist with HR control, with rates currently in the 70's to 120's. Remains on PTA Coreg  BID. Would want to avoid Cardizem long-term with his known cardiomyopathy. Will need to consider initiation of Digoxin to assist with rate-control or further titration of Coreg to 37.5mg  BID but unsure if this will provide adequate rate-control. Consider DCCV if rates remain difficult to control as he reports not missing any doses of Xarelto but the LA was moderately dilated by his last echo therefore long-term success of his DCCV is lower.   2. CAD - s/p BMS to dRCA and BMS to PDA in 2009, reported stenting in 2017 at the [place] - no records on file. Most recent cath in 06/2016 showed patent stents along RCA and LCx. - he denies any recurrent exertional symptoms. Has noted chest discomfort in the setting of his elevated HR.  - cyclic troponin values have been flat at 0.07 and 0.06 with EKG showing no acute ischemic changes. Prolonged QT is noted but it difficult to calculate with his flutter waves.  - continue BB and statin therapy. No ASA secondary to need for  Xarelto.   3. Chronic combined systolic and diastolic CHF - EF 25-30% by echo in 01/2017.  - he does not appear volume overloaded by physical examination. Weight has been stable at 205 -208 lbs on his home scales, at 204 lbs today.  - continue PTA Torsemide  daily, Coreg, Losartan, and Spironolactone.   4. HTN - BP is well-controlled at 127/67 - 153/82 within the past 24 hours.  - continue Coreg  BID, Losartan  daily, and Spironolactone  daily.   5. HLD - Lipid Panel in 12/2016 showed total cholesterol of 119, triglycerides 93, and LDL 64. At goal with LDL < 70. - continue Atorvastatin  daily.   6. COPD Exacerbation  - per admitting team.    Signed, Ellsworth Lennox, PA-C 03/26/2017, 11:30 AM Pager: [phone]

## 2017-03-27 ENCOUNTER — Inpatient Hospital Stay: Payer: Medicare Other | Admitting: Anesthesiology

## 2017-03-27 ENCOUNTER — Encounter: Payer: Self-pay | Admitting: Anesthesiology

## 2017-03-27 ENCOUNTER — Encounter
Admission: EM | Disposition: A | Discharge status: Home / Self Care | Payer: Self-pay | Source: Home / Self Care | Attending: Internal Medicine

## 2017-03-27 DIAGNOSIS — I2510 Atherosclerotic heart disease of native coronary artery without angina pectoris: Secondary | ICD-10-CM

## 2017-03-27 DIAGNOSIS — I5043 Acute on chronic combined systolic (congestive) and diastolic (congestive) heart failure: Secondary | ICD-10-CM

## 2017-03-27 HISTORY — PX: CARDIOVERSION: EP1203

## 2017-03-27 LAB — BASIC METABOLIC PANEL
ANION GAP: 10 (ref 5–15)
BUN: 27 mg/dL — ABNORMAL HIGH (ref 6–20)
CHLORIDE: 103 mmol/L (ref 101–111)
CO2: 24 mmol/L (ref 22–32)
CREATININE: 0.9 mg/dL (ref 0.61–1.24)
Calcium: 9.2 mg/dL (ref 8.9–10.3)
GFR calc non Af Amer: >60 mL/min (ref >60)
Glucose, Bld: 246 mg/dL — ABNORMAL HIGH (ref 65–99)
Potassium: 3.8 mmol/L (ref 3.5–5.1)
SODIUM: 137 mmol/L (ref 135–145)

## 2017-03-27 LAB — GLUCOSE, CAPILLARY
Glucose-Capillary: 210 mg/dL — ABNORMAL HIGH (ref 65–99)
Glucose-Capillary: 192 mg/dL — ABNORMAL HIGH (ref 65–99)

## 2017-03-27 LAB — MAGNESIUM: MAGNESIUM: 1.5 mg/dL — AB (ref 1.7–2.4)

## 2017-03-27 SURGERY — CARDIOVERSION (CATH LAB)
Anesthesia: General

## 2017-03-27 MED ORDER — PROPOFOL 10 MG/ML IV BOLUS
INTRAVENOUS | Status: AC
  Filled 2017-03-27: qty 20

## 2017-03-27 MED ORDER — GUAIFENESIN-DM 100-10 MG/5ML PO SYRP: 5.0000 mL | ORAL_SOLUTION | ORAL | 0 refills | Status: DC | PRN

## 2017-03-27 MED ORDER — PROPOFOL 10 MG/ML IV BOLUS
INTRAVENOUS | Status: DC | PRN
  Administered 2017-03-27: 40 mg via INTRAVENOUS

## 2017-03-27 MED ORDER — PREDNISONE 10 MG PO TABS: ORAL_TABLET | ORAL | 0 refills | Status: DC

## 2017-03-27 NOTE — Transfer of Care (Signed)
Immediate Anesthesia Transfer of Care Note  Patient: Champion Corales  Procedure(s) Performed: Procedure(s): CARDIOVERSION (N/A)  Patient Location: PACU  Anesthesia Type:General  Level of Consciousness: awake and sedated  Airway & Oxygen Therapy: Patient Spontanous Breathing and Patient connected to nasal cannula oxygen  Post-op Assessment: Report given to RN and Post -op Vital signs reviewed and stable  Post vital signs: Reviewed and stable  Last Vitals:  Vitals:   03/27/17 0801 03/27/17 0814  BP: 118/63   Pulse: (!) 49 (!) 160  Resp: 16 16  Temp: (!) 36.3 C 36.4 C  SpO2: 99% 96%    Last Pain:  Vitals:   03/27/17 0814  TempSrc: Oral  PainSc:          Complications: No apparent anesthesia complications

## 2017-03-27 NOTE — Discharge Instructions (Signed)
Heart Failure Clinic appointment on April 02 2017 at 12:20pm with Clarisa Kindred, FNP. Please call [phone] to reschedule.  Heart healthy diet.

## 2017-03-27 NOTE — Discharge Summary (Signed)
[facility]   PATIENT NAME: [name]    MR#:  [id number]  DATE OF BIRTH:  [birth year]  DATE OF ADMISSION:  03/26/2017   ADMITTING PHYSICIAN: Arnaldo Natal, MD  DATE OF DISCHARGE: 03/27/2017  2:24 PM  PRIMARY CARE PHYSICIAN: System, Pcp Not In   ADMISSION DIAGNOSIS:  Atrial flutter with rapid ventricular response (HCC) [I48.92] COPD with acute exacerbation (HCC) [J44.1] DISCHARGE DIAGNOSIS:  Active Problems:   Atrial fibrillation with RVR (HCC)  SECONDARY DIAGNOSIS:   Past Medical History:  Diagnosis Date  . CAD in native artery    a. stress echo 12/2007 abnl, EF > 55%, b. LHC 01/28/08: mLAD 30, D1 40, dLCx 70, pRCA 30, mRCA 70, mRCA lesion 2 80, PDA 90, s/p PCI/BMS to prox and distal RCA, s/p PCI/BMS to PDA; c. patient reports PCI/stenting x 2 in early 2017 at the [place] (no records on file) d. 06/2016: cath showing patent stents along RCA and LCx with moderate 40% stenosis along the LAD.   [name] Chronic combined systolic (congestive) and diastolic (congestive) heart failure (HCC)    a. echo 2009: > 55% b. 06/2016: EF 25-30% by echo in 06/2016, cath showing patent stents with moderate disease along LAD  . COPD (chronic obstructive pulmonary disease) (HCC)   . Gout   . Hyperlipidemia   . Hypertensive heart disease   . Obesity   . Tobacco abuse    HOSPITAL COURSE:   This is a 61 year old male admitted for atrial fibrillation/flutter with rapid ventricular rate 1. Persistent Atrial Flutter with RVR  He was on a diltiazem drip.  Status post cardioversion today. Heart rate is controlled and stable. Continue coreg and xarelto.  2. COPD: Exacerbation;  Improved with steroids and nebulizer.  3. Hypertension: Continue losartan and carvedilol  4. Elevated troponin: Likely secondary to demand. No EKG ischemic changes 5. CHF: systolic; chronic (appears to have resolved based on most recent echo which shows improved EF of >55%). Continue  Lasix and spironolactone per home regimen. 6. Diabetes mellitus type 2: resume metformin; sliding scale insulin while hospitalized  I discussed with Dr. Kirke Corin, who suggested that the patient can be discharged home today. DISCHARGE CONDITIONS:  Stable, discharged to home today. CONSULTS OBTAINED:  Treatment Team:  Antonieta Iba, MD DRUG ALLERGIES:   Allergies  Allergen Reactions  . Aspirin Other (See Comments)    Told not to take because of blood thinner   DISCHARGE MEDICATIONS:   Allergies as of 03/27/2017      Reactions   Aspirin Other (See Comments)   Told not to take because of blood thinner      Medication List    TAKE these medications   atorvastatin 40 MG tablet Commonly known as:  LIPITOR Take 40 mg by mouth daily.   budesonide-formoterol 160-4.5 MCG/ACT inhaler Commonly known as:  SYMBICORT Inhale 2 puffs into the lungs 2 (two) times daily.   carvedilol 25 MG tablet Commonly known as:  COREG Take 1 tablet (25 mg total) by mouth 2 (two) times daily with a meal.   COMBIVENT 18-103 MCG/ACT inhaler Generic drug:  albuterol-ipratropium Inhale 2 puffs into the lungs every 4 (four) hours as needed for wheezing.   docusate sodium 100 MG capsule Commonly known as:  COLACE Take 100 mg by mouth 2 (two) times daily.   guaiFENesin-dextromethorphan 100-10 MG/5ML syrup Commonly known as:  ROBITUSSIN DM Take 5 mLs by mouth every 4 (four) hours as needed  for cough.   losartan 25 MG tablet Commonly known as:  COZAAR Take 1 tablet (25 mg total) by mouth daily.   metFORMIN 1000 MG tablet Commonly known as:  GLUCOPHAGE Take 1,000 mg by mouth 2 (two) times daily with a meal.   nitroGLYCERIN 0.4 MG SL tablet Commonly known as:  NITROSTAT Place 0.4 mg under the tongue every 5 (five) minutes as needed for chest pain.   predniSONE 10 MG tablet Commonly known as:  DELTASONE 30 mg po daily for 1 day, 20 mg po daily for 1 day, 10 mg po daily for 1 day.   rivaroxaban 20  MG Tabs tablet Commonly known as:  XARELTO Take 1 tablet (20 mg total) by mouth daily with supper.   spironolactone 25 MG tablet Commonly known as:  ALDACTONE Take 1 tablet (25 mg total) by mouth daily.   torsemide 20 MG tablet Commonly known as:  DEMADEX Take 2 tablets (40 mg total) by mouth daily.   traZODone 50 MG tablet Commonly known as:  DESYREL Take 50 mg by mouth at bedtime.            Discharge Care Instructions        Start     Ordered   03/27/17 0000  guaiFENesin-dextromethorphan (ROBITUSSIN DM) 100-10 MG/5ML syrup  Every 4 hours PRN     03/27/17 1340   03/27/17 0000  predniSONE (DELTASONE) 10 MG tablet     03/27/17 1340   03/27/17 0000  Increase activity slowly     03/27/17 1343   03/27/17 0000  Diet - low sodium heart healthy     03/27/17 1343       DISCHARGE INSTRUCTIONS:  See AVS.  If you experience worsening of your admission symptoms, develop shortness of breath, life threatening emergency, suicidal or homicidal thoughts you must seek medical attention immediately by calling 911 or calling your MD immediately  if symptoms less severe.  You Must read complete instructions/literature along with all the possible adverse reactions/side effects for all the Medicines you take and that have been prescribed to you. Take any new Medicines after you have completely understood and accpet all the possible adverse reactions/side effects.   Please note  You were cared for by a hospitalist during your hospital stay. If you have any questions about your discharge medications or the care you received while you were in the hospital after you are discharged, you can call the unit and asked to speak with the hospitalist on call if the hospitalist that took care of you is not available. Once you are discharged, your primary care physician will handle any further medical issues. Please note that NO REFILLS for any discharge medications will be authorized once you are  discharged, as it is imperative that you return to your primary care physician (or establish a relationship with a primary care physician if you do not have one) for your aftercare needs so that they can reassess your need for medications and monitor your lab values.    On the day of Discharge:  VITAL SIGNS:  Blood pressure (!) 158/95, pulse 80, temperature (!) 97.5 F (36.4 C), temperature source Oral, resp. rate 16, height  (1.803 m), weight 208 lb (94.3 kg), SpO2 98 %. PHYSICAL EXAMINATION:  GENERAL:  61 y.o.-year-old patient lying in the bed with no acute distress.  EYES: Pupils equal, round, reactive to light and accommodation. No scleral icterus. Extraocular muscles intact.  HEENT: Head atraumatic, normocephalic. Oropharynx and nasopharynx  clear.  NECK:  Supple, no jugular venous distention. No thyroid enlargement, no tenderness.  LUNGS: Normal breath sounds bilaterally, no wheezing, rales,rhonchi or crepitation. No use of accessory muscles of respiration.  CARDIOVASCULAR: S1, S2 normal. No murmurs, rubs, or gallops.  ABDOMEN: Soft, non-tender, non-distended. Bowel sounds present. No organomegaly or mass.  EXTREMITIES: No pedal edema, cyanosis, or clubbing.  NEUROLOGIC: Cranial nerves II through XII are intact. Muscle strength 5/5 in all extremities. Sensation intact. Gait not checked.  PSYCHIATRIC: The patient is alert and oriented x 3.  SKIN: No obvious rash, lesion, or ulcer.  DATA REVIEW:   CBC  Recent Labs Lab 03/26/17 0146  WBC 9.3  HGB 13.6  HCT 41.7  PLT 206    Chemistries   Recent Labs Lab 03/27/17 0338  NA 137  K 3.8  CL 103  CO2 24  GLUCOSE 246*  BUN 27*  CREATININE 0.90  CALCIUM 9.2  MG 1.5*     Microbiology Results  No results found for this or any previous visit.  RADIOLOGY:  No results found.   Management plans discussed with the patient, family and they are in agreement.  CODE STATUS: Full Code   TOTAL TIME TAKING CARE OF THIS  PATIENT: 36 minutes.    Shaune Pollack M.D on 03/27/2017 at 4:22 PM  Between 7am to 6pm - Pager - [phone]  After 6pm go to [email or web address] - Social research officer, government  [facility] [place] [facility]  Office  [phone]  CC: Primary care physician; System, Pcp Not In   Note: This dictation was prepared with Dragon dictation along with smaller phrase technology. Any transcriptional errors that result from this process are unintentional.

## 2017-03-27 NOTE — Anesthesia Preprocedure Evaluation (Signed)
Anesthesia Evaluation  Patient identified by MRN, date of birth, ID band Patient awake    Reviewed: Allergy & Precautions, NPO status , Patient's Chart, lab work & pertinent test results, reviewed documented beta blocker date and time   Airway Mallampati: II  TM Distance: >3 FB     Dental  (+) Teeth Intact   Pulmonary COPD,  COPD inhaler, former smoker,    Pulmonary exam normal        Cardiovascular hypertension, Pt. on medications and Pt. on home beta blockers + CAD and +CHF  Normal cardiovascular exam     Neuro/Psych    GI/Hepatic negative GI ROS, Neg liver ROS,   Endo/Other  diabetes, Well Controlled, Type 2, Oral Hypoglycemic Agents  Renal/GU negative Renal ROS     Musculoskeletal negative musculoskeletal ROS (+)   Abdominal Normal abdominal exam  (+)   Peds  Hematology negative hematology ROS (+)   Anesthesia Other Findings   Reproductive/Obstetrics                             Anesthesia Physical Anesthesia Plan  ASA: III  Anesthesia Plan: General   Post-op Pain Management:    Induction: Intravenous  PONV Risk Score and Plan:   Airway Management Planned: Nasal Cannula  Additional Equipment:   Intra-op Plan:   Post-operative Plan:   Informed Consent: I have reviewed the patients History and Physical, chart, labs and discussed the procedure including the risks, benefits and alternatives for the proposed anesthesia with the patient or authorized representative who has indicated his/her understanding and acceptance.   Dental advisory given  Plan Discussed with: CRNA and Surgeon  Anesthesia Plan Comments:         Anesthesia Quick Evaluation

## 2017-03-27 NOTE — CV Procedure (Signed)
Cardioversion note: A standard informed consent was obtained. Timeout was performed. The pads were placed in the anterior posterior fashion. The patient was given propofol by the anesthesia team.  Successful cardioversion was performed with a 100 J. The patient converted to sinus rhythm. Pre-and post EKGs were reviewed. The patient tolerated the procedure with no immediate complications.  Recommendations: Continue same medications. Stable for discharge from a cardiac standpoint.

## 2017-03-27 NOTE — Anesthesia Post-op Follow-up Note (Signed)
Anesthesia QCDR form completed.        

## 2017-03-27 NOTE — Plan of Care (Signed)
Problem: Cardiac: Goal: Ability to achieve and maintain adequate cardiopulmonary perfusion will improve Outcome: Not Progressing Atrial flutter with rates 60-70s this shift.  Cardizem drip discontinued with SVR @ 37 (per CCMD).  VSS

## 2017-03-27 NOTE — Progress Notes (Signed)
Rounded on patient.  Patient s/p cardioversion for atrial flutter today.  Patient is now in Sinus Rhythm.  Patient sitting up on side of bed eating lunch when this nurse entered the room.  Reviewed HF self-management with patient.  Patient informed this nurse that he is weighing himself and assessing his symptoms every morning.  He also verbally confirmed he is following a low sodium diet and taking his medications as prescribed.  Upon asking patient about missing his [facility] appointment the end of August, patient stated, "Make the appointment and I will go."  Patient did not offer reason as to why he did not keep this appointment.  Patient informed this nurse that he has an appointment at the [facility] on April 03, 2017. I mentioned Cardiac Rehab.  The patient stated he would like to participate in Cardiac Rehab.  I explained to patient if Cardiac Rehab is ordered by the [place], then it would be covered by [place].  Cardiac Rehab brochure provided to patient along with our phone number and fax number for patient to show his [facility].  I explained to patient once we get the order faxed to our department we will contact him and schedule an appointment for his orientation.  Patient in agreement with plan.    Emailed [facility] requesting an appointment for patient.    Army Melia, RN, BSN, [facility] Cardiovascular and Pulmonary Nurse Navigator

## 2017-03-27 NOTE — Care Management (Signed)
Patient cardioverted this day and for discharge.  No discharge needs identified by members of the care team.  Notified [facility] of discharge

## 2017-03-27 NOTE — Anesthesia Postprocedure Evaluation (Signed)
Anesthesia Post Note  Patient: [name]  Procedure(s) Performed: Procedure(s) (LRB): CARDIOVERSION (N/A)  Patient location during evaluation: PACU Anesthesia Type: General Level of consciousness: awake and alert and oriented Pain management: pain level controlled Vital Signs Assessment: post-procedure vital signs reviewed and stable Respiratory status: spontaneous breathing Cardiovascular status: blood pressure returned to baseline Anesthetic complications: no     Last Vitals:  Vitals:   03/27/17 1015 03/27/17 1040  BP: (!) 140/92 (!) 158/95  Pulse: 73 80  Resp: 13 16  Temp:  (!) 36.4 C  SpO2: 100% 98%    Last Pain:  Vitals:   03/27/17 1040  TempSrc: Oral  PainSc:                  Wrigley Plasencia

## 2017-03-27 NOTE — Progress Notes (Signed)
Progress Note  Patient Name: Schawn Byas Date of Encounter: 03/27/2017  Primary Cardiologist: [facility]  Subjective   Noted palpitations yesterday, now resolved. Undergoing breathing treatment. For DCCV later this morning.   Inpatient Medications    Scheduled Meds: . atorvastatin  40 mg Oral Daily  . carvedilol  25 mg Oral BID WC  . docusate sodium  100 mg Oral BID  . doxycycline  100 mg Oral Q12H  . Influenza vac split quadrivalent PF  0.5 mL Intramuscular Tomorrow-1000  . insulin aspart  0-20 Units Subcutaneous TID WC  . insulin aspart  0-5 Units Subcutaneous QHS  . insulin aspart  4 Units Subcutaneous TID WC  . ipratropium-albuterol  3 mL Nebulization Q6H  . losartan  25 mg Oral Daily  . mometasone-formoterol  2 puff Inhalation BID  . off the beat book   Does not apply Once  . predniSONE  40 mg Oral Q breakfast   Followed by  . [START ON 03/28/2017] predniSONE  30 mg Oral Q breakfast   Followed by  . [START ON 03/29/2017] predniSONE  20 mg Oral Q breakfast   Followed by  . [START ON 03/30/2017] predniSONE  10 mg Oral Q breakfast   Followed by  . [START ON 03/31/2017] predniSONE  5 mg Oral Q breakfast  . rivaroxaban  20 mg Oral Q supper  . sodium chloride flush  3 mL Intravenous Q12H  . spironolactone  25 mg Oral Daily  . torsemide  40 mg Oral Daily  . traZODone  50 mg Oral QHS   Continuous Infusions: . sodium chloride    . diltiazem (CARDIZEM) infusion Stopped (03/27/17 0226)   PRN Meds: acetaminophen **OR** acetaminophen, guaiFENesin-dextromethorphan, nitroGLYCERIN, ondansetron **OR** ondansetron (ZOFRAN) IV, sodium chloride flush   Vital Signs    Vitals:   03/26/17 2137 03/27/17 0231 03/27/17 0338 03/27/17 0538  BP: (!) 152/78 117/70 124/69   Pulse: 73 (!) 47 76   Resp:  18 16   Temp:   97.6 F (36.4 C)   TempSrc:   Oral   SpO2: 97% 100% 100%   Weight:    208 lb 8 oz (94.6 kg)  Height:        Intake/Output Summary (Last 24 hours) at 03/27/17  0743 Last data filed at 03/27/17 0500  Gross per 24 hour  Intake           637.15 ml  Output             2750 ml  Net         -2112.85 ml   Filed Weights   03/26/17 0142 03/27/17 0538  Weight: 204 lb (92.5 kg) 208 lb 8 oz (94.6 kg)    Telemetry    Atrial flutter, HR variable in the 60's to 120's.  - Personally Reviewed  ECG    No new tracings.   Physical Exam   General: Well developed, well nourished African American male appearing in no acute distress. Head: Normocephalic, atraumatic.  Neck: Supple without bruits, JVD not elevated. Lungs:  Resp regular and unlabored, CTA without wheezing or rales. Heart: Irregularly irregularly, S1, S2, no S3, S4, or murmur; no rub. Abdomen: Soft, non-tender, non-distended with normoactive bowel sounds. No hepatomegaly. No rebound/guarding. No obvious abdominal masses. Extremities: No clubbing, cyanosis, or lower extremity edema. Distal pedal pulses are 2+ bilaterally. Neuro: Alert and oriented X 3. Moves all extremities spontaneously. Psych: Normal affect.  Labs    Chemistry Recent Labs Lab  03/26/17 0146 03/27/17 0338  NA 140 137  K 3.7 3.8  CL 103 103  CO2 29 24  GLUCOSE 122* 246*  BUN 18 27*  CREATININE 1.01 0.90  CALCIUM 9.9 9.2  GFRNONAA >60 >60  GFRAA >60 >60  ANIONGAP 8 10     Hematology Recent Labs Lab 03/26/17 0146  WBC 9.3  RBC 5.00  HGB 13.6  HCT 41.7  MCV 83.4  MCH 27.3  MCHC 32.7  RDW 21.5*  PLT 206    Cardiac Enzymes Recent Labs Lab 03/26/17 0146 03/26/17 0422  TROPONINI 0.07* 0.06*   No results for input(s): TROPIPOC in the last 168 hours.   BNP Recent Labs Lab 03/26/17 0146  BNP 202.0*     DDimer No results for input(s): DDIMER in the last 168 hours.   Radiology    Dg Chest Portable 1 View  Result Date: 03/26/2017 CLINICAL DATA:  61 y/o  M; chest pain. EXAM: PORTABLE CHEST 1 VIEW COMPARISON:  02/18/2017 chest radiograph FINDINGS: Stable moderate cardiomegaly. Aortic  atherosclerosis with calcification. Clear lungs. No pleural effusion or pneumothorax. No acute osseous abnormality is evident. IMPRESSION: Stable moderate cardiomegaly.  Aortic atherosclerosis.  Clear lungs. Electronically Signed   By: Mitzi Hansen M.D.   On: 03/26/2017 02:03    Cardiac Studies   Echocardiogram: 01/2017 Study Conclusions  - Left ventricle: The cavity size was normal. There was moderate   concentric hypertrophy. Systolic function was severely reduced.   The estimated ejection fraction was in the range of 25% to 30%.   Diffuse hypokinesis. Regional wall motion abnormalities cannot be   excluded. The study is not technically sufficient to allow   evaluation of LV diastolic function. - Mitral valve: There was mild to moderate regurgitation. - Left atrium: The atrium was mildly to moderately dilated. - Right ventricle: Systolic function was moderately reduced. - Pulmonary arteries: PA peak pressure: 36 mm Hg (S).  Impressions:  - Rhythm is atrial flutter.  Patient Profile     61 y.o. male w/ PMH of CAD (s/p BMS to dRCA and BMS to PDA in 2009, reported stenting in 2017 at the [place] - no records on file, cath in 06/2016 showing patent stents along RCA and LCx), chronic combined systolic and diastolic CHF (EF 57-84% by echo in 01/2017), PAF (on Xarelto), hypertensive heart disease, COPD, HTN, and HLD admitted to [facility] on 03/26/2017 for atrial flutter with RVR.   Assessment & Plan    1. Persistent Atrial Flutter with RVR - initially diagnosed with atrial flutter in 01/2017 and started on Xarelto at that time. He reports intermittent palpitations for the past month but says he developed acute worsening of his symptoms last night.  -  Na+ 140, K+ 3.7, creatinine 1.01. BNP 202. Initial troponin 0.07 with repeat value of 0.06. CXR showed no acute cardiopulmonary disease.  - he was initially placed on IV Cardizem as HR was in the 120's on admission. Has been variable  overnight with 4:1 atrial flutter conduction and HR into the 50's at times. IV Cardizem has been discontinued and he remains on Coreg  BID. He reports good compliance with Xarelto and denies missing any doses over the past 4 weeks. Scheduled for DCCV at 0845. Has been NPO since midnight.    2. CAD - s/p BMS to dRCA and BMS to PDA in 2009, reported stenting in 2017 at the [place] - no records on file. Most recent cath in 06/2016 showed patent stents along RCA and  LCx. - he denies any recurrent exertional symptoms. Has noted chest discomfort in the setting of his elevated HR.  - cyclic troponin values have been flat at 0.07 and 0.06 with EKG showing no acute ischemic changes. Prolonged QT is noted but it difficult to calculate with his flutter waves.  - continue BB and statin therapy. No ASA secondary to need for Xarelto.   3. Chronic combined systolic and diastolic CHF - EF 25-30% by echo in 01/2017.  - he does not appear volume overloaded by physical examination. Weight has been stable at 205 -208 lbs on his home scales, at 208 lbs today. - continue PTA Torsemide  daily, Coreg, Losartan, and Spironolactone.   4. HTN - BP has been well-controlled at 117/56 - 153/80 within the past 24 hours.  - continue Coreg  BID, Losartan  daily, and Spironolactone  daily.   5. HLD - Lipid Panel in 12/2016 showed total cholesterol of 119, triglycerides 93, and LDL 64. At goal with LDL < 70. - continue Atorvastatin  daily.   6. COPD Exacerbation  - on Doxycyline, steroid taper, and nebulizer treatment.  - per admitting team.   Signed, Ellsworth Lennox , PA-C 7:43 AM 03/27/2017 Pager: [phone]

## 2017-03-27 NOTE — Anesthesia Procedure Notes (Signed)
Date/Time: 03/27/2017 9:48 AM Performed by: Junious Silk Pre-anesthesia Checklist: Patient identified, Emergency Drugs available, Suction available, Patient being monitored and Timeout performed Oxygen Delivery Method: Nasal cannula

## 2017-03-28 ENCOUNTER — Encounter: Payer: Self-pay | Admitting: Cardiovascular Disease

## 2017-03-30 ENCOUNTER — Telehealth: Payer: Self-pay | Admitting: Cardiovascular Disease

## 2017-03-30 NOTE — Telephone Encounter (Signed)
Patient is hospitalized for atrial flutter. Possible discharge home later today or tomorrow.  Schedule follow-up appointment with APP in 2 weeks.   Called pt for TCM. He is scheduled 10/18 appt w/Dr. Mariah Milling. Pt reports that he is followed at the [facility] and has an appt there on Oct 5 and would like to cancel appt in our office.

## 2017-04-02 ENCOUNTER — Ambulatory Visit: Payer: Medicare Other | Admitting: Family

## 2017-04-02 ENCOUNTER — Telehealth: Payer: Self-pay | Admitting: Family

## 2017-04-02 NOTE — Telephone Encounter (Signed)
Patient did not show for his Heart Failure Clinic appointment on 04/02/17. Will attempt to reschedule.   Of note, this is the 3rd appointment that he has missed.

## 2017-04-16 ENCOUNTER — Ambulatory Visit: Payer: Medicare Other | Admitting: Cardiovascular Disease

## 2017-06-18 ENCOUNTER — Emergency Department
Admission: EM | Admit: 2017-06-18 | Discharge: 2017-06-18 | Disposition: A | Discharge status: Home / Self Care | Payer: Medicare Other | Attending: Emergency Medicine | Admitting: Emergency Medicine

## 2017-06-18 ENCOUNTER — Other Ambulatory Visit: Payer: Self-pay

## 2017-06-18 ENCOUNTER — Emergency Department: Payer: Medicare Other

## 2017-06-18 ENCOUNTER — Encounter: Payer: Self-pay | Admitting: Emergency Medicine

## 2017-06-18 DIAGNOSIS — I110 Hypertensive heart disease with heart failure: Secondary | ICD-10-CM | POA: Insufficient documentation

## 2017-06-18 DIAGNOSIS — I5042 Chronic combined systolic (congestive) and diastolic (congestive) heart failure: Secondary | ICD-10-CM | POA: Insufficient documentation

## 2017-06-18 DIAGNOSIS — Z87891 Personal history of nicotine dependence: Secondary | ICD-10-CM | POA: Insufficient documentation

## 2017-06-18 DIAGNOSIS — M25462 Effusion, left knee: Secondary | ICD-10-CM | POA: Insufficient documentation

## 2017-06-18 DIAGNOSIS — I2510 Atherosclerotic heart disease of native coronary artery without angina pectoris: Secondary | ICD-10-CM | POA: Insufficient documentation

## 2017-06-18 DIAGNOSIS — Z79899 Other long term (current) drug therapy: Secondary | ICD-10-CM | POA: Insufficient documentation

## 2017-06-18 DIAGNOSIS — J449 Chronic obstructive pulmonary disease, unspecified: Secondary | ICD-10-CM | POA: Insufficient documentation

## 2017-06-18 DIAGNOSIS — Z95818 Presence of other cardiac implants and grafts: Secondary | ICD-10-CM | POA: Insufficient documentation

## 2017-06-18 LAB — CBC WITH DIFFERENTIAL/PLATELET
Basophils Absolute: 0.1 10*3/uL (ref 0–0.1)
Basophils Relative: 1 %
Eosinophils Absolute: 0.4 10*3/uL (ref 0–0.7)
Eosinophils Relative: 5 %
HEMATOCRIT: 39.3 % — AB (ref 40.0–52.0)
Hemoglobin: 12.9 g/dL — ABNORMAL LOW (ref 13.0–18.0)
LYMPHS PCT: 15 %
Lymphs Abs: 1.3 10*3/uL (ref 1.0–3.6)
MCH: 30.3 pg (ref 26.0–34.0)
MCHC: 32.9 g/dL (ref 32.0–36.0)
MCV: 92.1 fL (ref 80.0–100.0)
MONO ABS: 0.7 10*3/uL (ref 0.2–1.0)
MONOS PCT: 9 %
NEUTROS ABS: 6.1 10*3/uL (ref 1.4–6.5)
Neutrophils Relative %: 70 %
Platelets: 207 10*3/uL (ref 150–440)
RBC: 4.27 MIL/uL — ABNORMAL LOW (ref 4.40–5.90)
RDW: 17.2 % — AB (ref 11.5–14.5)
WBC: 8.7 10*3/uL (ref 3.8–10.6)

## 2017-06-18 LAB — SYNOVIAL CELL COUNT + DIFF, W/ CRYSTALS
Crystals, Fluid: NONE SEEN
Eosinophils-Synovial: 0 %
LYMPHOCYTES-SYNOVIAL FLD: 1 %
MONOCYTE-MACROPHAGE-SYNOVIAL FLUID: 9 %
Neutrophil, Synovial: 90 %
Other Cells-SYN: 0
WBC, Synovial: 12523 /mm3 — ABNORMAL HIGH (ref 0–200)

## 2017-06-18 LAB — BASIC METABOLIC PANEL
ANION GAP: 10 (ref 5–15)
BUN: 12 mg/dL (ref 6–20)
CALCIUM: 9.3 mg/dL (ref 8.9–10.3)
CO2: 25 mmol/L (ref 22–32)
Chloride: 102 mmol/L (ref 101–111)
Creatinine, Ser: 0.88 mg/dL (ref 0.61–1.24)
GFR calc Af Amer: >60 mL/min (ref >60)
GFR calc non Af Amer: >60 mL/min (ref >60)
GLUCOSE: 155 mg/dL — AB (ref 65–99)
POTASSIUM: 3.7 mmol/L (ref 3.5–5.1)
Sodium: 137 mmol/L (ref 135–145)

## 2017-06-18 MED ORDER — PREDNISONE 50 MG PO TABS: ORAL_TABLET | ORAL | 0 refills | Status: DC

## 2017-06-18 MED ORDER — LIDOCAINE HCL (PF) 1 % IJ SOLN
INTRAMUSCULAR | Status: AC
  Filled 2017-06-18: qty 5

## 2017-06-18 MED ORDER — KETOROLAC TROMETHAMINE 60 MG/2ML IM SOLN
15.0000 mg | Freq: Once | INTRAMUSCULAR | Status: AC
  Administered 2017-06-18: 15 mg via INTRAMUSCULAR

## 2017-06-18 MED ORDER — LIDOCAINE HCL (PF) 1 % IJ SOLN
5.0000 mL | Freq: Once | INTRAMUSCULAR | Status: AC
  Administered 2017-06-18: 5 mL via INTRADERMAL

## 2017-06-18 MED ORDER — KETOROLAC TROMETHAMINE 30 MG/ML IJ SOLN
INTRAMUSCULAR | Status: AC
  Filled 2017-06-18: qty 1

## 2017-06-18 MED ORDER — LIDOCAINE HCL (PF) 1 % IJ SOLN
5.0000 mL | Freq: Once | INTRAMUSCULAR | Status: AC
  Administered 2017-06-18: 5 mL via INTRADERMAL
  Filled 2017-06-18 (×2): qty 5

## 2017-06-18 MED ORDER — OXYCODONE-ACETAMINOPHEN 5-325 MG PO TABS: 1.0000 | ORAL_TABLET | Freq: Three times a day (TID) | ORAL | 0 refills | Status: DC | PRN

## 2017-06-18 NOTE — ED Notes (Addendum)
Pt provided diet ginger ale and [place] sandwich tray, per request. Watching television and eating. No further requests at this time.

## 2017-06-18 NOTE — ED Provider Notes (Signed)
Synovial fluid analysis reveals likely sterile inflammatory fluid.  We will place an Ace wrap and provide crutches.  I will give pain medicine and prednisone.  He is stable for orthopedics follow-up.   Emily FilbertWilliams, Veanna Dower E, MD 06/18/17 2126

## 2017-06-18 NOTE — ED Triage Notes (Signed)
Pt c/o left knee pain and swelling X 1 week.  Knee notably swollen compared to right.  Hot to touch on exam compared to right knee.  No known fevers.  Has not been able to bear weight well r/t pain.

## 2017-06-18 NOTE — ED Notes (Signed)
Pt ambulatory with cane to wheel chair upon discharge. Verbalized understanding of discharge instructions, follow-up care and prescriptions. VSS. A&O x4. Skin warm and dry.

## 2017-06-18 NOTE — ED Provider Notes (Signed)
[facility] Emergency Department Provider Note  ____________________________________________  Time seen: Approximately 7:43 PM  I have reviewed the triage vital signs and the nursing notes.   HISTORY  Chief Complaint Knee Pain    HPI [name] is a 61 y.o. male who complains of left knee pain and swelling for the past week.Constant, worse with movement, no alleviating factors. Denies fevers chills or sweats. No trauma. Moderate intensity and aching.     Past Medical History:  Diagnosis Date  . CAD in native artery    a. stress echo 12/2007 abnl, EF > 55%, b. LHC 01/28/08: mLAD 30, D1 40, dLCx 70, pRCA 30, mRCA 70, mRCA lesion 2 80, PDA 90, s/p PCI/BMS to prox and distal RCA, s/p PCI/BMS to PDA; c. patient [place] 2 [place] ([place]) d. 06/2016: cath showing patent stents along RCA and LCx with moderate 40% stenosis along the LAD.   [name]. Chronic combined systolic (congestive) and diastolic (congestive) heart failure (HCC)    a. echo 2009: > 55% b. 06/2016: EF 25-30% by echo in 06/2016, cath showing patent stents with moderate disease along LAD  . COPD (chronic obstructive pulmonary disease) (HCC)   . Gout   . Hyperlipidemia   . Hypertensive heart disease   . Obesity   . Tobacco abuse      Patient Active Problem List   Diagnosis Date Noted  . Atrial fibrillation with RVR (HCC) 03/26/2017  . Atrial flutter with rapid ventricular response (HCC) 02/18/2017  . Diastolic CHF, acute on chronic (HCC) 01/06/2017  . CAD in native artery   . Chest pain 01/05/2017  . Ischemic cardiomyopathy 07/26/2016  . Non-sustained ventricular tachycardia (HCC) 07/26/2016  . COPD (chronic obstructive pulmonary disease) (HCC) 07/24/2016  . CAD (coronary artery disease) 07/24/2016  . HLD (hyperlipidemia) 07/24/2016  . Acute on chronic combined systolic and diastolic CHF (congestive heart failure) (HCC) 07/24/2016  . Acute  respiratory distress 07/24/2016  . Tobacco abuse 07/24/2016  . Accelerated hypertension 07/24/2016  . Hypertensive heart disease 07/24/2016     Past Surgical History:  Procedure Laterality Date  . CARDIAC CATHETERIZATION  2009   [facility];   . CARDIAC CATHETERIZATION  2010  . CARDIAC CATHETERIZATION N/A 07/28/2016   Procedure: Right and Left Heart Cath and possible PCI;  Surgeon: Iran OuchMuhammad A Arida, MD;  Location: [facility] INVASIVE CV LAB;  Service: Cardiovascular;  Laterality: N/A;  . CARDIOVERSION N/A 03/27/2017   Procedure: CARDIOVERSION;  Surgeon: Iran OuchArida, Muhammad A, MD;  Location: [facility] ORS;  Service: Cardiovascular;  Laterality: N/A;  . CORONARY ANGIOPLASTY  2009   [facility].     Prior to Admission medications   Medication Sig Start Date End Date Taking? Authorizing Provider  albuterol-ipratropium (COMBIVENT) 18-103 MCG/ACT inhaler Inhale 2 puffs into the lungs every 4 (four) hours as needed for wheezing.    [provider]  atorvastatin (LIPITOR) 40 MG tablet Take 40 mg by mouth daily.     [provider]  budesonide-formoterol (SYMBICORT) 160-4.5 MCG/ACT inhaler Inhale 2 puffs into the lungs 2 (two) times daily.    [provider]  carvedilol (COREG) 25 MG tablet Take 1 tablet (25 mg total) by mouth 2 (two) times daily with a meal. 07/31/16   Enedina FinnerPatel, Sona, MD  docusate sodium (COLACE) 100 MG capsule Take 100 mg by mouth 2 (two) times daily.    [provider]  guaiFENesin-dextromethorphan (ROBITUSSIN DM) 100-10 MG/5ML  syrup Take 5 mLs by mouth every 4 (four) hours as needed for cough. 03/27/17   Shaune Pollack, MD  losartan (COZAAR) 25 MG tablet Take 1 tablet (25 mg total) by mouth daily. 01/08/17   Enedina Finner, MD  metFORMIN (GLUCOPHAGE) 1000 MG tablet Take 1,000 mg by mouth 2 (two) times daily with a meal.     [provider]  nitroGLYCERIN (NITROSTAT) 0.4 MG SL tablet Place 0.4 mg under the tongue every 5 (five) minutes as needed for  chest pain.    [provider]  predniSONE (DELTASONE) 10 MG tablet 30 mg po daily for 1 day, 20 mg po daily for 1 day, 10 mg po daily for 1 day. 03/27/17   Shaune Pollack, MD  rivaroxaban (XARELTO) 20 MG TABS tablet Take 1 tablet (20 mg total) by mouth daily with supper. 02/19/17   Adrian Saran, MD  spironolactone (ALDACTONE) 25 MG tablet Take 1 tablet (25 mg total) by mouth daily. 08/01/16   Enedina Finner, MD  torsemide (DEMADEX) 20 MG tablet Take 2 tablets (40 mg total) by mouth daily. 01/07/17   Enedina Finner, MD  traZODone (DESYREL) 50 MG tablet Take 50 mg by mouth at bedtime.    [provider]     Allergies Aspirin   Family History  Problem Relation Age of Onset  . Heart attack Mother 23    Social History Social History   Tobacco Use  . Smoking status: Former Smoker    Packs/day: 1.00    Years: 35.00    Pack years: 35.00    Types: Cigarettes  . Smokeless tobacco: Never Used  Substance Use Topics  . Alcohol use: No    Comment: Quit 8 yrs.  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 Drug use: No    Review of Systems  Constitutional:   No fever or chills.  ENT:   No sore throat. No rhinorrhea. Cardiovascular:   No chest pain or syncope. Respiratory:   No dyspnea or cough.  Musculoskeletal:  Left knee pain as above All other systems reviewed and are negative except as documented above in ROS and HPI.  ____________________________________________   PHYSICAL EXAM:  VITAL SIGNS: ED Triage Vitals  Enc Vitals Group     BP 06/18/17 1443 (!) 179/79     Pulse Rate 06/18/17 1441 94     Resp 06/18/17 1441 18     Temp 06/18/17 1441 98.7 F (37.1 C)     Temp Source 06/18/17 1441 Oral     SpO2 06/18/17 1441 98 %     Weight 06/18/17 1441 220 lb (99.8 kg)     Height 06/18/17 1441 5\' 11"  (1.803 m)     Head Circumference --      Peak Flow --      Pain Score 06/18/17 1441 10     Pain Loc --      Pain Edu? --      Excl. in GC? --     Vital signs reviewed, nursing assessments  reviewed.   Constitutional:   Alert and oriented. Well appearing and in no distress. Eyes:   No scleral icterus.  EOMI. No nystagmus. No conjunctival pallor. PERRL. ENT   Head:   Normocephalic and atraumatic.   Nose:   No congestion/rhinnorhea.    Mouth/Throat:   MMM, no pharyngeal erythema. No peritonsillar mass.    Neck:   No meningismus. Full ROM. Hematological/Lymphatic/Immunilogical:   No cervical lymphadenopathy. Cardiovascular:   RRR. Symmetric bilateral radial and  DP pulses.  No murmurs.  Respiratory:   Normal respiratory effort without tachypnea/retractions. Breath sounds are clear and equal bilaterally. No wheezes/rales/rhonchi. Gastrointestinal:   Soft and nontender. Non distended. There is no CVA tenderness.  No rebound, rigidity, or guarding. Genitourinary:   deferred Musculoskeletal:   Left knee very painful with active or passive range of motion. Left knee grossly swollen with a palpable effusion. Tender to the touch, warm to the touch. No erythema or induration or evidence of overlying cellulitis around the joint. Neurologic:   Normal speech and language.  Motor grossly intact. No gross focal neurologic deficits are appreciated.  Skin:    Skin is warm, dry and intact. No rash noted.  No petechiae, purpura, or bullae.  ____________________________________________    LABS (pertinent positives/negatives) (all labs ordered are listed, but only abnormal results are displayed) Labs Reviewed  BASIC METABOLIC PANEL - Abnormal; Notable for the following components:      Result Value   Glucose, Bld 155 (*)    All other components within normal limits  CBC WITH DIFFERENTIAL/PLATELET - Abnormal; Notable for the following components:   RBC 4.27 (*)    Hemoglobin 12.9 (*)    HCT 39.3 (*)    RDW 17.2 (*)    All other components within normal limits  BODY FLUID CULTURE  GRAM STAIN  GLUCOSE, BODY FLUID OTHER  PROTEIN, BODY FLUID (OTHER)  SYNOVIAL CELL  COUNT + DIFF, W/ CRYSTALS   ____________________________________________   EKG    ____________________________________________    RADIOLOGY  Dg Knee Complete 4 Views Left  Result Date: 06/18/2017 CLINICAL DATA:  Acute left knee pain and swelling for 1 week. No known injury. Initial encounter. EXAM: LEFT KNEE - COMPLETE 4+ VIEW COMPARISON:  None. FINDINGS: No acute fracture, subluxation or dislocation. A moderate knee effusion is present. Mild joint space narrowing in the medial and patellofemoral compartments noted. No suspicious focal bony lesions identified. IMPRESSION: Moderate knee effusion without acute bony abnormality. Mild degenerative changes in the medial and patellofemoral compartments. Electronically Signed   By: Harmon Pier M.D.   On: 06/18/2017 15:20    ____________________________________________   PROCEDURES .Joint Aspiration/Arthrocentesis Date/Time: 06/18/2017 7:46 PM Performed by: Sharman Cheek, MD Authorized by: Sharman Cheek, MD   Consent:    Consent obtained:  Written   Consent given by:  Patient   Risks discussed:  Bleeding, infection, pain and incomplete drainage   Alternatives discussed:  No treatment Location:    Location:  Knee   Knee:  L knee Anesthesia (see MAR for exact dosages):    Anesthesia method:  Local infiltration   Local anesthetic:  Lidocaine 1% w/o epi Procedure details:    Preparation: Patient was prepped and draped in usual sterile fashion     Needle gauge:  18 G   Ultrasound guidance: no     Approach:  Medial   Aspirate characteristics:  Blood-tinged and serous   Steroid injected: no     Specimen collected: yes   Post-procedure details:    Dressing:  Sterile dressing   Patient tolerance of procedure:  Tolerated well, no immediate complications    ____________________________________________   DIFFERENTIAL DIAGNOSIS  Inflammatory arthritis, septic arthritis, traumatic arthritis, gout, pseudogout  CLINICAL  IMPRESSION / ASSESSMENT AND PLAN / ED COURSE  Pertinent labs & imaging results that were available during my care of the patient were reviewed by me and considered in my medical decision making (see chart for details).   Patient is well-appearing no acute  distress, not systemically ill, but presents with a hot swollen painful left knee. This necessitates evaluation for possible septic arthritis. With normal white blood cell count and afebrile, would not empirically start antibiotics, pending lab results. Arthrocentesis was successfully performed, 20 mL of synovial fluid sent to the lab for evaluation. We'll follow up on results of further assess. No evidence of fracture on x-ray and no history of trauma. [facility], he'll need to follow up with orthopedics if he doesn't have septic arthritis and doesn't need immediate intervention.      ____________________________________________   FINAL CLINICAL IMPRESSION(S) / ED DIAGNOSES    Final diagnoses:  Effusion of left knee      This SmartLink is deprecated. Use AVSMEDLIST instead to display the medication list for a patient.   Portions of this note were generated with dragon dictation software. Dictation errors may occur despite best attempts at proofreading.    Sharman CheekStafford, Sundai Probert, MD 06/18/17 [phone]

## 2017-06-18 NOTE — ED Notes (Signed)
This Rn walked down synovial fluid from L knee to lab with labels in hand.

## 2017-06-20 LAB — PROTEIN, BODY FLUID (OTHER): Total Protein, Body Fluid Other: 4.1 g/dL

## 2017-06-20 LAB — GLUCOSE, BODY FLUID OTHER: Glucose, Body Fluid Other: 98 mg/dL

## 2017-06-22 LAB — BODY FLUID CULTURE: CULTURE: NO GROWTH

## 2017-08-07 ENCOUNTER — Emergency Department
Admission: EM | Admit: 2017-08-07 | Discharge: 2017-08-07 | Disposition: A | Discharge status: Home / Self Care | Payer: Medicare Other | Attending: Emergency Medicine | Admitting: Emergency Medicine

## 2017-08-07 ENCOUNTER — Encounter: Payer: Self-pay | Admitting: Emergency Medicine

## 2017-08-07 ENCOUNTER — Emergency Department: Payer: Medicare Other

## 2017-08-07 DIAGNOSIS — R0602 Shortness of breath: Secondary | ICD-10-CM

## 2017-08-07 DIAGNOSIS — I509 Heart failure, unspecified: Secondary | ICD-10-CM

## 2017-08-07 DIAGNOSIS — Z7901 Long term (current) use of anticoagulants: Secondary | ICD-10-CM | POA: Diagnosis not present

## 2017-08-07 DIAGNOSIS — Z87891 Personal history of nicotine dependence: Secondary | ICD-10-CM | POA: Diagnosis not present

## 2017-08-07 DIAGNOSIS — I2510 Atherosclerotic heart disease of native coronary artery without angina pectoris: Secondary | ICD-10-CM | POA: Insufficient documentation

## 2017-08-07 DIAGNOSIS — Z79899 Other long term (current) drug therapy: Secondary | ICD-10-CM | POA: Insufficient documentation

## 2017-08-07 DIAGNOSIS — Z955 Presence of coronary angioplasty implant and graft: Secondary | ICD-10-CM | POA: Diagnosis not present

## 2017-08-07 DIAGNOSIS — Z7984 Long term (current) use of oral hypoglycemic drugs: Secondary | ICD-10-CM | POA: Insufficient documentation

## 2017-08-07 DIAGNOSIS — J449 Chronic obstructive pulmonary disease, unspecified: Secondary | ICD-10-CM | POA: Diagnosis not present

## 2017-08-07 LAB — BASIC METABOLIC PANEL
Anion gap: 8 (ref 5–15)
BUN: 13 mg/dL (ref 6–20)
CHLORIDE: 108 mmol/L (ref 101–111)
CO2: 22 mmol/L (ref 22–32)
Calcium: 9.1 mg/dL (ref 8.9–10.3)
Creatinine, Ser: 0.94 mg/dL (ref 0.61–1.24)
GFR calc Af Amer: >60 mL/min (ref >60)
GFR calc non Af Amer: >60 mL/min (ref >60)
Glucose, Bld: 200 mg/dL — ABNORMAL HIGH (ref 65–99)
POTASSIUM: 3.5 mmol/L (ref 3.5–5.1)
SODIUM: 138 mmol/L (ref 135–145)

## 2017-08-07 LAB — CBC
HEMATOCRIT: 35.9 % — AB (ref 40.0–52.0)
HEMOGLOBIN: 11.8 g/dL — AB (ref 13.0–18.0)
MCH: 29 pg (ref 26.0–34.0)
MCHC: 32.9 g/dL (ref 32.0–36.0)
MCV: 88.1 fL (ref 80.0–100.0)
Platelets: 132 10*3/uL — ABNORMAL LOW (ref 150–440)
RBC: 4.08 MIL/uL — ABNORMAL LOW (ref 4.40–5.90)
RDW: 16.3 % — AB (ref 11.5–14.5)
WBC: 6.4 10*3/uL (ref 3.8–10.6)

## 2017-08-07 LAB — BRAIN NATRIURETIC PEPTIDE: B Natriuretic Peptide: 906 pg/mL — ABNORMAL HIGH (ref 0.0–100.0)

## 2017-08-07 LAB — TROPONIN I
Troponin I: 0.05 ng/mL (ref <0.03)
Troponin I: 0.04 ng/mL (ref <0.03)

## 2017-08-07 MED ORDER — FUROSEMIDE 10 MG/ML IJ SOLN
40.0000 mg | Freq: Once | INTRAMUSCULAR | Status: AC
  Administered 2017-08-07: 40 mg via INTRAVENOUS
  Filled 2017-08-07: qty 4

## 2017-08-07 NOTE — ED Notes (Signed)
Date and time results received: 08/07/17 0903   Test: troponin Critical Value: 0.05  Name of Provider Notified: Darnelle CatalanMalinda

## 2017-08-07 NOTE — ED Provider Notes (Signed)
[facility] Emergency Department Provider Note   ____________________________________________   First MD Initiated Contact with Patient 08/07/17 [id number]     (approximate)  I have reviewed the triage vital signs and the nursing notes.   HISTORY  Chief Complaint Chest Pain and Shortness of Breath    HPI [name] is a 62 y.o. male Who reports shortness of breath when laying down. He told the nurse chest pain but he did not tell that to the EMS people myself. She says he is short of breath when he lays down he has a cough productive of some yellow phlegm. He reports he is not taking his Lasix regularly. He also says he's not using his CPAP because he can't breathe withdrawn. He is tried nasal pillows in the mask at work for him. He denies a fever.   Past Medical History:  Diagnosis Date  . CAD in native artery    a. stress echo 12/2007 abnl, EF > 55%, b. LHC 01/28/08: mLAD 30, D1 40, dLCx 70, pRCA 30, mRCA 70, mRCA lesion 2 80, PDA 90, s/p PCI/BMS to prox and distal RCA, s/p PCI/BMS to PDA; c. patient reports PCI/stenting x 2 in early 2017 at the [facility] (no records on file) d. 06/2016: cath showing patent stents along RCA and LCx with moderate 40% stenosis along the LAD.   [name]. Chronic combined systolic (congestive) and diastolic (congestive) heart failure (HCC)    a. echo 2009: > 55% b. 06/2016: EF 25-30% by echo in 06/2016, cath showing patent stents with moderate disease along LAD  . COPD (chronic obstructive pulmonary disease) (HCC)   . Gout   . Hyperlipidemia   . Hypertensive heart disease   . Obesity   . Tobacco abuse     Patient Active Problem List   Diagnosis Date Noted  . Atrial fibrillation with RVR (HCC) 03/26/2017  . Atrial flutter with rapid ventricular response (HCC) 02/18/2017  . Diastolic CHF, acute on chronic (HCC) 01/06/2017  . CAD in native artery   . Chest pain 01/05/2017  . Ischemic cardiomyopathy 07/26/2016  . Non-sustained ventricular  tachycardia (HCC) 07/26/2016  . COPD (chronic obstructive pulmonary disease) (HCC) 07/24/2016  . CAD (coronary artery disease) 07/24/2016  . HLD (hyperlipidemia) 07/24/2016  . Acute on chronic combined systolic and diastolic CHF (congestive heart failure) (HCC) 07/24/2016  . Acute respiratory distress 07/24/2016  . Tobacco abuse 07/24/2016  . Accelerated hypertension 07/24/2016  . Hypertensive heart disease 07/24/2016    Past Surgical History:  Procedure Laterality Date  . CARDIAC CATHETERIZATION  2009   [facility];   . CARDIAC CATHETERIZATION  2010  . CARDIAC CATHETERIZATION N/A 07/28/2016   Procedure: Right and Left Heart Cath and possible PCI;  Surgeon: Iran OuchMuhammad A Arida, MD;  Location: [facility] INVASIVE CV LAB;  Service: Cardiovascular;  Laterality: N/A;  . CARDIOVERSION N/A 03/27/2017   Procedure: CARDIOVERSION;  Surgeon: Iran OuchArida, Muhammad A, MD;  Location: [facility] ORS;  Service: Cardiovascular;  Laterality: N/A;  . CORONARY ANGIOPLASTY  2009   [facility]    Prior to Admission medications   Medication Sig Start Date End Date Taking? Authorizing Provider  albuterol-ipratropium (COMBIVENT) 18-103 MCG/ACT inhaler Inhale 2 puffs into the lungs every 4 (four) hours as needed for wheezing.   Yes [provider]  atorvastatin (LIPITOR) 40 MG tablet Take 40 mg by mouth daily.    Yes [provider]  budesonide-formoterol (SYMBICORT) 160-4.5 MCG/ACT inhaler Inhale 2 puffs into the lungs 2 (  two) times daily.   Yes [provider]  carvedilol (COREG) 25 MG tablet Take 1 tablet (25 mg total) by mouth 2 (two) times daily with a meal. 07/31/16  Yes Enedina Finner, MD  docusate sodium (COLACE) 100 MG capsule Take 100 mg by mouth 2 (two) times daily.   Yes [provider]  losartan (COZAAR) 25 MG tablet Take 1 tablet (25 mg total) by mouth daily. 01/08/17  Yes Enedina Finner, MD  metFORMIN (GLUCOPHAGE) 1000 MG tablet Take 1,000 mg by mouth 2 (two) times daily with a  meal.    Yes [provider]  rivaroxaban (XARELTO) 20 MG TABS tablet Take 1 tablet (20 mg total) by mouth daily with supper. 02/19/17  Yes Adrian Saran, MD  spironolactone (ALDACTONE) 25 MG tablet Take 1 tablet (25 mg total) by mouth daily. 08/01/16  Yes Enedina Finner, MD  torsemide (DEMADEX) 20 MG tablet Take 2 tablets (40 mg total) by mouth daily. 01/07/17  Yes Enedina Finner, MD  traZODone (DESYREL) 50 MG tablet Take 50 mg by mouth at bedtime.   Yes [provider]  guaiFENesin-dextromethorphan (ROBITUSSIN DM) 100-10 MG/5ML syrup Take 5 mLs by mouth every 4 (four) hours as needed for cough. Patient not taking: Reported on 08/07/2017 03/27/17   Shaune Pollack, MD  nitroGLYCERIN (NITROSTAT) 0.4 MG SL tablet Place 0.4 mg under the tongue every 5 (five) minutes as needed for chest pain.    [provider]  oxyCODONE-acetaminophen (PERCOCET) 5-325 MG tablet Take 1-2 tablets by mouth every 8 (eight) hours as needed. Patient not taking: Reported on 08/07/2017 06/18/17   Emily Filbert, MD  predniSONE (DELTASONE) 10 MG tablet 30 mg po daily for 1 day, 20 mg po daily for 1 day, 10 mg po daily for 1 day. Patient not taking: Reported on 08/07/2017 03/27/17   Shaune Pollack, MD  predniSONE (DELTASONE) 50 MG tablet Take 1 tablet by mouth daily Patient not taking: Reported on 08/07/2017 06/18/17   Emily Filbert, MD    Allergies Aspirin  Family History  Problem Relation Age of Onset  . Heart attack Mother 54    Social History Social History   Tobacco Use  . Smoking status: Former Smoker    Packs/day: 1.00    Years: 35.00    Pack years: 35.00    Types: Cigarettes  . Smokeless tobacco: Never Used  Substance Use Topics  . Alcohol use: No    Comment: Quit 8 yrs.  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 Drug use: No    Review of Systems  Constitutional: No fever/chills Eyes: No visual changes. ENT: No sore throat. Cardiovascular: Denies chest pain. Respiratory: see history of present  illness Gastrointestinal: No abdominal pain.  No nausea, no vomiting.  No diarrhea.  No constipation. Genitourinary: Negative for dysuria. Musculoskeletal: Negative for back pain. Skin: Negative for rash. Neurological: Negative for headaches, focal weakness   ____________________________________________   PHYSICAL EXAM:  VITAL SIGNS: ED Triage Vitals  Enc Vitals Group     BP 08/07/17 0826 (!) 169/72     Pulse Rate 08/07/17 0826 79     Resp 08/07/17 0826 16     Temp 08/07/17 0826 97.6 F (36.4 C)     Temp Source 08/07/17 0826 Oral     SpO2 08/07/17 0826 97 %     Weight 08/07/17 0827 238 lb (108 kg)     Height 08/07/17 0827 5\' 11"  (1.803 m)     Head Circumference --  Peak Flow --      Pain Score 08/07/17 0827 10     Pain Loc --      Pain Edu? --      Excl. in GC? --     Constitutional: Alert and oriented. Well appearing and in no acute distress. Eyes: Conjunctivae are normal. Head: Atraumatic. Nose: No congestion/rhinnorhea. Mouth/Throat: Mucous membranes are moist.  Oropharynx non-erythematous. Neck: No stridor.  Cardiovascular: Normal rate, regular rhythm. Grossly normal heart sounds.  Good peripheral circulation. Respiratory: Normal respiratory effort.  No retractions. Lungssome wheezes on the left. Gastrointestinal: Soft and nontender. No distention. No abdominal bruits. No CVA tenderness. Musculoskeletal: No lower extremity tenderness nor edema.  No joint effusions. Neurologic:  Normal speech and language. No gross focal neurologic deficits are appreciated. No gait instability. Skin:  Skin is warm, dry and intact. No rash noted. Psychiatric: Mood and affect are normal. Speech and behavior are normal.  ____________________________________________   LABS (all labs ordered are listed, but only abnormal results are displayed)  Labs Reviewed  BASIC METABOLIC PANEL - Abnormal; Notable for the following components:      Result Value   Glucose, Bld 200 (*)    All  other components within normal limits  CBC - Abnormal; Notable for the following components:   RBC 4.08 (*)    Hemoglobin 11.8 (*)    HCT 35.9 (*)    RDW 16.3 (*)    Platelets 132 (*)    All other components within normal limits  TROPONIN I - Abnormal; Notable for the following components:   Troponin I 0.05 (*)    All other components within normal limits  BRAIN NATRIURETIC PEPTIDE - Abnormal; Notable for the following components:   B Natriuretic Peptide 906.0 (*)    All other components within normal limits  TROPONIN I - Abnormal; Notable for the following components:   Troponin I 0.04 (*)    All other components within normal limits  DIFFERENTIAL   ____________________________________________  EKG  EKG read and interpreted by me shows normal sinus rhythm rate of 82 slight left axis there is flipped T waves in V4 5 and 6 which are old. ____________________________________________  RADIOLOGY  ED MD interpretation:  chest x-ray looks like a large heart and some interstitial markings consistent with congestive failure  Official radiology report(s): Dg Chest Portable 1 View  Result Date: 08/07/2017 CLINICAL DATA:  Chest pain and shortness of breath when lying down. Productive cough. EXAM: PORTABLE CHEST 1 VIEW COMPARISON:  03/26/2017. FINDINGS: Stable enlarged cardiac silhouette and prominent pulmonary vasculature. Mild increase in diffuse peribronchial thickening and accentuation of the interstitial markings. No pleural fluid or airspace consolidation. Unremarkable bones. IMPRESSION: 1. Mild bronchitic changes superimposed on mild chronic interstitial lung disease. 2. Stable cardiomegaly and mild pulmonary vascular congestion. Electronically Signed   By: Beckie Salts M.D.   On: 08/07/2017 09:03    ____________________________________________   PROCEDURES  Procedure(s) performed:   Procedures  Critical Care performed:    ____________________________________________   INITIAL IMPRESSION / ASSESSMENT AND PLAN / ED COURSE  patient's sats remained 98%. When he walks he does not desaturate. When he lays flat he says he feels short of breath but his saturations stayed the same and his heart rate does not change. He feels somewhat better after the Lasix. I will let him go home. I will have him follow-up with his regular doctor or see a cardiologist here. That is Dr. Tuttle Sink today have given him the cardiologist name  and number.        ____________________________________________   FINAL CLINICAL IMPRESSION(S) / ED DIAGNOSES  Final diagnoses:  Shortness of breath  Congestive heart failure, unspecified HF chronicity, unspecified heart failure type [facility])     ED Discharge Orders    None       Note:  This document was prepared using Dragon voice recognition software and may include unintentional dictation errors.    Arnaldo Natal, MD 08/07/17 1249

## 2017-08-07 NOTE — ED Triage Notes (Addendum)
Pt arrived with complaints of shortness of breath when laying down and chest pain. Pt states this has a been a persistent problem for 2 weeks. Upon arrival to the ED pt alert and oriented x 4 and breathing independently with a oxygen saturation of 97% on room air. Pt reports a productive cough.

## 2017-08-07 NOTE — ED Notes (Signed)
Pt has hx of sleep apnea and COPD. Pt O2 rate dropped to 10 pt was sleeping. Pt awakes just fine and is Alert and oriented when woken up. Will make primary RN aware.

## 2017-08-07 NOTE — Discharge Instructions (Signed)
Use your oxygen at night. Continue to use your other medications as directed. Follow-up with your doctor in a week or so or you can see the cardiologist here I will give you his name and phone number. return if worse at all.

## 2017-08-07 NOTE — ED Notes (Signed)
Pt assisted to ambulate in hallway and maintained a oxygen saturation above 96%.

## 2017-08-08 ENCOUNTER — Emergency Department
Admission: EM | Admit: 2017-08-08 | Discharge: 2017-08-09 | Disposition: A | Discharge status: Home / Self Care | Payer: Medicare Other | Attending: Emergency Medicine | Admitting: Emergency Medicine

## 2017-08-08 ENCOUNTER — Emergency Department: Payer: Medicare Other

## 2017-08-08 DIAGNOSIS — I5042 Chronic combined systolic (congestive) and diastolic (congestive) heart failure: Secondary | ICD-10-CM | POA: Insufficient documentation

## 2017-08-08 DIAGNOSIS — Z9861 Coronary angioplasty status: Secondary | ICD-10-CM | POA: Insufficient documentation

## 2017-08-08 DIAGNOSIS — Z79899 Other long term (current) drug therapy: Secondary | ICD-10-CM | POA: Insufficient documentation

## 2017-08-08 DIAGNOSIS — Z87891 Personal history of nicotine dependence: Secondary | ICD-10-CM | POA: Insufficient documentation

## 2017-08-08 DIAGNOSIS — J449 Chronic obstructive pulmonary disease, unspecified: Secondary | ICD-10-CM

## 2017-08-08 DIAGNOSIS — R55 Syncope and collapse: Secondary | ICD-10-CM

## 2017-08-08 DIAGNOSIS — I110 Hypertensive heart disease with heart failure: Secondary | ICD-10-CM | POA: Insufficient documentation

## 2017-08-08 DIAGNOSIS — I2510 Atherosclerotic heart disease of native coronary artery without angina pectoris: Secondary | ICD-10-CM | POA: Insufficient documentation

## 2017-08-08 LAB — CBC WITH DIFFERENTIAL/PLATELET
Basophils Absolute: 0 10*3/uL (ref 0–0.1)
Basophils Relative: 1 %
EOS ABS: 0.4 10*3/uL (ref 0–0.7)
EOS PCT: 6 %
HCT: 40.7 % (ref 40.0–52.0)
HEMOGLOBIN: 13.3 g/dL (ref 13.0–18.0)
LYMPHS ABS: 1.4 10*3/uL (ref 1.0–3.6)
Lymphocytes Relative: 21 %
MCH: 29.1 pg (ref 26.0–34.0)
MCHC: 32.6 g/dL (ref 32.0–36.0)
MCV: 89.2 fL (ref 80.0–100.0)
Monocytes Absolute: 0.4 10*3/uL (ref 0.2–1.0)
Monocytes Relative: 7 %
Neutro Abs: 4.4 10*3/uL (ref 1.4–6.5)
Neutrophils Relative %: 67 %
Platelets: 162 10*3/uL (ref 150–440)
RBC: 4.56 MIL/uL (ref 4.40–5.90)
RDW: 16.2 % — ABNORMAL HIGH (ref 11.5–14.5)
WBC: 6.5 10*3/uL (ref 3.8–10.6)

## 2017-08-08 LAB — BASIC METABOLIC PANEL
Anion gap: 14 (ref 5–15)
BUN: 14 mg/dL (ref 6–20)
CHLORIDE: 99 mmol/L — AB (ref 101–111)
CO2: 25 mmol/L (ref 22–32)
CREATININE: 1.27 mg/dL — AB (ref 0.61–1.24)
Calcium: 9.5 mg/dL (ref 8.9–10.3)
GFR calc Af Amer: >60 mL/min (ref >60)
GFR calc non Af Amer: 60 mL/min — ABNORMAL LOW (ref >60)
Glucose, Bld: 177 mg/dL — ABNORMAL HIGH (ref 65–99)
Potassium: 3.5 mmol/L (ref 3.5–5.1)
Sodium: 138 mmol/L (ref 135–145)

## 2017-08-08 LAB — TROPONIN I: TROPONIN I: 0.06 ng/mL — AB (ref <0.03)

## 2017-08-08 LAB — BRAIN NATRIURETIC PEPTIDE: B Natriuretic Peptide: 289 pg/mL — ABNORMAL HIGH (ref 0.0–100.0)

## 2017-08-08 NOTE — ED Notes (Signed)
Date and time results received: 08/08/17   Test: troponin  Critical Value:0.06  Name of Provider Notified: siadecki  Orders Received? Or Actions Taken?: md notified

## 2017-08-08 NOTE — ED Triage Notes (Signed)
Pt arrives via ems from home. Ems reports pt complaining of chest pain and dizziness, chest pain is midsternum non radiating. Ems reports pt took 2 0.4 nitro tabs before calling ems. Pt hx of COPD seen here this past week. Pt does not appear to be in distress at this time. Pt does state he feels SOB but does not appear dyspneic and is not using any accessory muscles. Pt 02 97 room air

## 2017-08-08 NOTE — ED Provider Notes (Signed)
[facility] Emergency Department Provider Note ____________________________________________   First MD Initiated Contact with Patient 08/08/17 2138     (approximate)  I have reviewed the triage vital signs and the nursing notes.   HISTORY  Chief Complaint Chest Pain and Dizziness    HPI [name] is a 62 y.o. male with past medical history as noted below including CHF, COPD, CAD and gout who presents primarily with dizziness, acute onset approximately an hour prior to arrival, described as lightheadedness and feeling like he was going to pass out, and occurring while he was preparing food for his family.  The patient also reports persistent chest pain over the last 2 weeks which is substernal and nonradiating.  He reports persistent shortness of breath over the same time.  As well.  Patient was seen in the ED yesterday for chest pain shortness of breath and treated for presumed CHF with Lasix.  He states that his symptoms improved somewhat and he was able to sleep last night without difficulty, but then started to feel worse again this afternoon.  The patient reports pain in his left knee which is also chronic but denies any new injury there, or any swelling or rash.  Past Medical History:  Diagnosis Date  . CAD in native artery    a. stress echo 12/2007 abnl, EF > 55%, b. LHC 01/28/08: mLAD 30, D1 40, dLCx 70, pRCA 30, mRCA 70, mRCA lesion 2 80, PDA 90, s/p PCI/BMS to prox and distal RCA, s/p PCI/BMS to PDA; c. patient reports PCI/stenting x 2 in early 2017 at the [place] (no records on file) d. 06/2016: cath showing patent stents along RCA and LCx with moderate 40% stenosis along the LAD.   [name] Chronic combined systolic (congestive) and diastolic (congestive) heart failure (HCC)    a. echo 2009: > 55% b. 06/2016: EF 25-30% by echo in 06/2016, cath showing patent stents with moderate disease along LAD  . COPD (chronic obstructive pulmonary disease) (HCC)   . Gout     . Hyperlipidemia   . Hypertensive heart disease   . Obesity   . Tobacco abuse     Patient Active Problem List   Diagnosis Date Noted  . Atrial fibrillation with RVR (HCC) 03/26/2017  . Atrial flutter with rapid ventricular response (HCC) 02/18/2017  . Diastolic CHF, acute on chronic (HCC) 01/06/2017  . CAD in native artery   . Chest pain 01/05/2017  . Ischemic cardiomyopathy 07/26/2016  . Non-sustained ventricular tachycardia (HCC) 07/26/2016  . COPD (chronic obstructive pulmonary disease) (HCC) 07/24/2016  . CAD (coronary artery disease) 07/24/2016  . HLD (hyperlipidemia) 07/24/2016  . Acute on chronic combined systolic and diastolic CHF (congestive heart failure) (HCC) 07/24/2016  . Acute respiratory distress 07/24/2016  . Tobacco abuse 07/24/2016  . Accelerated hypertension 07/24/2016  . Hypertensive heart disease 07/24/2016    Past Surgical History:  Procedure Laterality Date  . CARDIAC CATHETERIZATION  2009   [facility];   . CARDIAC CATHETERIZATION  2010  . CARDIAC CATHETERIZATION N/A 07/28/2016   Procedure: Right and Left Heart Cath and possible PCI;  Surgeon: Iran Ouch, MD;  Location: [facility] INVASIVE CV LAB;  Service: Cardiovascular;  Laterality: N/A;  . CARDIOVERSION N/A 03/27/2017   Procedure: CARDIOVERSION;  Surgeon: Iran Ouch, MD;  Location: [facility] ORS;  Service: Cardiovascular;  Laterality: N/A;  . CORONARY ANGIOPLASTY  2009   s/p stent placement at [facility].    Prior to Admission medications   Medication Sig  Start Date End Date Taking? Authorizing Provider  albuterol-ipratropium (COMBIVENT) 18-103 MCG/ACT inhaler Inhale 2 puffs into the lungs every 4 (four) hours as needed for wheezing.    [provider]  atorvastatin (LIPITOR) 40 MG tablet Take 40 mg by mouth daily.     [provider]  budesonide-formoterol (SYMBICORT) 160-4.5 MCG/ACT inhaler Inhale 2 puffs into the lungs 2 (two) times daily.    [provider]  carvedilol (COREG)  25 MG tablet Take 1 tablet (25 mg total) by mouth 2 (two) times daily with a meal. 07/31/16   Enedina FinnerPatel, Sona, MD  docusate sodium (COLACE) 100 MG capsule Take 100 mg by mouth 2 (two) times daily.    [provider]  guaiFENesin-dextromethorphan (ROBITUSSIN DM) 100-10 MG/5ML syrup Take 5 mLs by mouth every 4 (four) hours as needed for cough. Patient not taking: Reported on 08/07/2017 03/27/17   Shaune Pollackhen, Qing, MD  losartan (COZAAR) 25 MG tablet Take 1 tablet (25 mg total) by mouth daily. 01/08/17   Enedina FinnerPatel, Sona, MD  metFORMIN (GLUCOPHAGE) 1000 MG tablet Take 1,000 mg by mouth 2 (two) times daily with a meal.     [provider]  nitroGLYCERIN (NITROSTAT) 0.4 MG SL tablet Place 0.4 mg under the tongue every 5 (five) minutes as needed for chest pain.    [provider]  oxyCODONE-acetaminophen (PERCOCET) 5-325 MG tablet Take 1-2 tablets by mouth every 8 (eight) hours as needed. Patient not taking: Reported on 08/07/2017 06/18/17   Emily FilbertWilliams, Jonathan E, MD  predniSONE (DELTASONE) 10 MG tablet 30 mg po daily for 1 day, 20 mg po daily for 1 day, 10 mg po daily for 1 day. Patient not taking: Reported on 08/07/2017 03/27/17   Shaune Pollackhen, Qing, MD  predniSONE (DELTASONE) 50 MG tablet Take 1 tablet by mouth daily Patient not taking: Reported on 08/07/2017 06/18/17   Emily FilbertWilliams, Jonathan E, MD  rivaroxaban (XARELTO) 20 MG TABS tablet Take 1 tablet (20 mg total) by mouth daily with supper. 02/19/17   Adrian SaranMody, Sital, MD  spironolactone (ALDACTONE) 25 MG tablet Take 1 tablet (25 mg total) by mouth daily. 08/01/16   Enedina FinnerPatel, Sona, MD  torsemide (DEMADEX) 20 MG tablet Take 2 tablets (40 mg total) by mouth daily. 01/07/17   Enedina FinnerPatel, Sona, MD  traZODone (DESYREL) 50 MG tablet Take 50 mg by mouth at bedtime.    [provider]    Allergies Aspirin  Family History  Problem Relation Age of Onset  . Heart attack Mother 155    Social History Social History   Tobacco Use  . Smoking status: Former Smoker     Packs/day: 1.00    Years: 35.00    Pack years: 35.00    Types: Cigarettes  . Smokeless tobacco: Never Used  Substance Use Topics  . Alcohol use: No    Comment: Quit 8 yrs.  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 Drug use: No    Review of Systems  Constitutional: No fever.  Positive for chills. Eyes: No redness. ENT: No sore throat. Cardiovascular: Positive for chest pain. Respiratory: Positive for shortness of breath. Gastrointestinal: No nausea, no vomiting.  Genitourinary: Negative for flank pain.  Musculoskeletal: Negative for back pain. Skin: Negative for rash. Neurological: Negative for headache.   ____________________________________________   PHYSICAL EXAM:  VITAL SIGNS: ED Triage Vitals  Enc Vitals Group     BP 08/08/17 2114 (!) 142/105     Pulse Rate 08/08/17 2114 77     Resp 08/08/17 2114 19  Temp 08/08/17 2114 (!) 97.4 F (36.3 C)     Temp Source 08/08/17 2114 Oral     SpO2 08/08/17 2114 98 %     Weight 08/08/17 2115 226 lb (102.5 kg)     Height 08/08/17 2115 5\' 11"  (1.803 m)     Head Circumference --      Peak Flow --      Pain Score 08/08/17 2114 7     Pain Loc --      Pain Edu? --      Excl. in GC? --     Constitutional: Alert and oriented.  Relatively well appearing and in no acute distress. Eyes: Conjunctivae are normal.  EOMI. Head: Atraumatic. Nose: No congestion/rhinnorhea. Mouth/Throat: Mucous membranes are moist.   Neck: Normal range of motion.  Cardiovascular: Normal rate, regular rhythm. Grossly normal heart sounds.  Good peripheral circulation. Respiratory: Normal respiratory effort.  No retractions.  Decreased breath sounds bilaterally with no audible rales or wheeze Gastrointestinal: Soft and nontender. No distention.  Genitourinary: No CVA tenderness. Musculoskeletal: No lower extremity edema.  Extremities warm and well perfused.  Left knee with full range of motion actively and passively with mild pain, no focal bony tenderness, and  palpable effusion.  No erythema or induration. Neurologic:  Normal speech and language. No gross focal neurologic deficits are appreciated.  Skin:  Skin is warm and dry. No rash noted. Psychiatric: Mood and affect are normal. Speech and behavior are normal.  ____________________________________________   LABS (all labs ordered are listed, but only abnormal results are displayed)  Labs Reviewed  BASIC METABOLIC PANEL - Abnormal; Notable for the following components:      Result Value   Chloride 99 (*)    Glucose, Bld 177 (*)    Creatinine, Ser 1.27 (*)    GFR calc non Af Amer 60 (*)    All other components within normal limits  CBC WITH DIFFERENTIAL/PLATELET - Abnormal; Notable for the following components:   RDW 16.2 (*)    All other components within normal limits  TROPONIN I - Abnormal; Notable for the following components:   Troponin I 0.06 (*)    All other components within normal limits  BRAIN NATRIURETIC PEPTIDE - Abnormal; Notable for the following components:   B Natriuretic Peptide 289.0 (*)    All other components within normal limits  BLOOD GAS, VENOUS - Abnormal; Notable for the following components:   Bicarbonate 29.2 (*)    Acid-Base Excess 4.0 (*)    All other components within normal limits  TROPONIN I   ____________________________________________  EKG  ED ECG REPORT I, Dionne Bucy, the attending physician, personally viewed and interpreted this ECG.  Date: 08/08/2017 EKG Time: 2109 Rate: 78 Rhythm: normal sinus rhythm QRS Axis: normal Intervals: normal ST/T Wave abnormalities: LVH with repolarization abnormality and diffuse T wave inversions Narrative Interpretation: no evidence of acute ischemia; no significant change when compared to EKG performed yesterday  ____________________________________________  RADIOLOGY  CXR: Cardiomegaly with mild vascular  congestion  ____________________________________________   PROCEDURES  Procedure(s) performed: No  Procedures  Critical Care performed: No ____________________________________________   INITIAL IMPRESSION / ASSESSMENT AND PLAN / ED COURSE  Pertinent labs & imaging results that were available during my care of the patient were reviewed by me and considered in my medical decision making (see chart for details).  62 year old male with past medical history of CHF, COPD, and other PMH as noted above presents primarily with lightheadedness and an episode of  near syncope this evening, as well as with persistent chest pain and shortness of breath which have remained relatively consistent over the last 2 weeks per the patient.  He was seen in the ED yesterday for shortness of breath, thought to be most likely due to CHF and felt somewhat better after Lasix; he states he continued to feel a bit better today until later in the day and this evening.  I reviewed the past medical records in Epic and confirmed the details of the visit yesterday.  The patient was also seen in the ED in December for knee pain and swelling diagnosed as gout, and he was admitted last fall for COPD exacerbation and A. Fib.  On exam here, the patient appears relatively comfortable, vital signs are normal except for slight hypertension, and the patient is not significantly hypoxic with O2 sat in the mid to high 90s on room air during my exam.  He is in no respiratory distress.  Lung sounds are decreased bilaterally.  There is no significant peripheral edema.  Neuro exam is nonfocal.  No evidence on exam of new fluid collection or acute injury to the left knee.  Differential for the lightheadedness includes dehydration, hypovolemia, vasovagal episode, hypercapnia or other COPD related cause, or less likely primary cardiac cause.  Differential for the chest pain and shortness of breath includes ACS, acute on chronic CHF, or  infectious cause.  There is no clinical evidence for acute PE or DVT given the gradual subacute onset of symptoms, the lack of exam findings to suggest DVT, and the lack of hypoxia or tachycardia.  No evidence of vascular cause.  Plan: Chest x-ray, CHF and COPD workup, and reassess.    ----------------------------------------- 12:15 AM on 08/09/2017 -----------------------------------------  Patient's workup is reassuring.  BNP significantly decreased since yesterday, chest x-ray with no new findings, and the remainder the lab workup is unremarkable.  The only significant abnormalities patient's troponin which is minimally elevated from yesterday.  The patient was sleeping comfortably when I reassessed him, with an O2 sat at 100%.  I suspect that his current presentation is more likely related to COPD rather than acute CHF.  I also suspect that the near syncope may have been due to mild hypovolemia or fluid shift after getting IV Lasix yesterday.  I will give a dose of Solu-Medrol, and obtain a repeat troponin after 3 hours.  I signed the patient out to the oncoming physician Dr. Dolores Frame who will follow up on the troponin.  Anticipate likely discharge home if troponin is not increasing, and patient continues to have normal vital signs.  ____________________________________________   FINAL CLINICAL IMPRESSION(S) / ED DIAGNOSES  Final diagnoses:  Near syncope      NEW MEDICATIONS STARTED DURING THIS VISIT:  New Prescriptions   No medications on file     Note:  This document was prepared using Dragon voice recognition software and may include unintentional dictation errors.    Dionne Bucy, MD 08/09/17 [phone]

## 2017-08-09 LAB — TROPONIN I: TROPONIN I: 0.05 ng/mL — AB (ref <0.03)

## 2017-08-09 MED ORDER — PREDNISONE 20 MG PO TABS: ORAL_TABLET | ORAL | 0 refills | Status: DC

## 2017-08-09 MED ORDER — METHYLPREDNISOLONE SODIUM SUCC 125 MG IJ SOLR
125.0000 mg | Freq: Once | INTRAMUSCULAR | Status: AC
  Administered 2017-08-09: 125 mg via INTRAVENOUS
  Filled 2017-08-09: qty 2

## 2017-08-09 NOTE — Discharge Instructions (Signed)
1.  Take prednisone 60 mg daily for the next 4 days. °2.  Return to the ER for worsening symptoms, persistent vomiting, difficulty breathing or other concerns. °

## 2017-08-09 NOTE — ED Provider Notes (Signed)
-----------------------------------------   1:44 AM on 08/09/2017 -----------------------------------------  Awaken patient from sleep to update him of repeat and decreasing troponin.  Will discharge home with prednisone burst for COPD exacerbation.  Strict return precautions given.  Patient verbalizes understanding and agrees with plan of care.   Irean HongSung, Jade J, MD 08/09/17 [phone]

## 2017-08-09 NOTE — ED Notes (Signed)
Pt given phone to make calls to notify a friend/family member of transportation needs at d/c. Pt states no one is answering and unable to secure a ride home at this time. Charge RN, Lea, to be made aware of possible need for taxi voucher.

## 2017-08-10 LAB — BLOOD GAS, VENOUS
ACID-BASE EXCESS: 4 mmol/L — AB (ref 0.0–2.0)
Bicarbonate: 29.2 mmol/L — ABNORMAL HIGH (ref 20.0–28.0)
O2 Saturation: 74.8 %
PO2 VEN: 39 mmHg (ref 32.0–45.0)
Patient temperature: 37
pCO2, Ven: 45 mmHg (ref 44.0–60.0)
pH, Ven: 7.42 (ref 7.250–7.430)

## 2017-12-17 ENCOUNTER — Emergency Department: Payer: Non-veteran care

## 2017-12-17 ENCOUNTER — Encounter: Payer: Self-pay | Admitting: Emergency Medicine

## 2017-12-17 ENCOUNTER — Other Ambulatory Visit: Payer: Self-pay

## 2017-12-17 ENCOUNTER — Emergency Department
Admission: EM | Admit: 2017-12-17 | Discharge: 2017-12-17 | Disposition: A | Discharge status: Home / Self Care | Payer: Non-veteran care | Attending: Emergency Medicine | Admitting: Emergency Medicine

## 2017-12-17 DIAGNOSIS — J449 Chronic obstructive pulmonary disease, unspecified: Secondary | ICD-10-CM | POA: Insufficient documentation

## 2017-12-17 DIAGNOSIS — Z7984 Long term (current) use of oral hypoglycemic drugs: Secondary | ICD-10-CM | POA: Diagnosis not present

## 2017-12-17 DIAGNOSIS — I5032 Chronic diastolic (congestive) heart failure: Secondary | ICD-10-CM | POA: Insufficient documentation

## 2017-12-17 DIAGNOSIS — I110 Hypertensive heart disease with heart failure: Secondary | ICD-10-CM | POA: Diagnosis not present

## 2017-12-17 DIAGNOSIS — I2510 Atherosclerotic heart disease of native coronary artery without angina pectoris: Secondary | ICD-10-CM | POA: Diagnosis not present

## 2017-12-17 DIAGNOSIS — M5441 Lumbago with sciatica, right side: Secondary | ICD-10-CM

## 2017-12-17 DIAGNOSIS — Z87891 Personal history of nicotine dependence: Secondary | ICD-10-CM | POA: Insufficient documentation

## 2017-12-17 DIAGNOSIS — E119 Type 2 diabetes mellitus without complications: Secondary | ICD-10-CM | POA: Diagnosis not present

## 2017-12-17 DIAGNOSIS — Z79899 Other long term (current) drug therapy: Secondary | ICD-10-CM | POA: Diagnosis not present

## 2017-12-17 DIAGNOSIS — M545 Low back pain: Secondary | ICD-10-CM | POA: Diagnosis present

## 2017-12-17 HISTORY — DX: Type 2 diabetes mellitus without complications: E11.9

## 2017-12-17 LAB — URINALYSIS, COMPLETE (UACMP) WITH MICROSCOPIC
Bacteria, UA: NONE SEEN
Bilirubin Urine: NEGATIVE
Glucose, UA: >=500 mg/dL — AB
HGB URINE DIPSTICK: NEGATIVE
KETONES UR: NEGATIVE mg/dL
Leukocytes, UA: NEGATIVE
Nitrite: NEGATIVE
PROTEIN: 100 mg/dL — AB
Specific Gravity, Urine: 1.023 (ref 1.005–1.030)
pH: 5 (ref 5.0–8.0)

## 2017-12-17 MED ORDER — OXYCODONE HCL 5 MG PO TABS: 5.0000 mg | ORAL_TABLET | Freq: Four times a day (QID) | ORAL | 0 refills | Status: DC | PRN

## 2017-12-17 MED ORDER — ORPHENADRINE CITRATE 30 MG/ML IJ SOLN
60.0000 mg | Freq: Once | INTRAMUSCULAR | Status: AC
  Administered 2017-12-17: 60 mg via INTRAMUSCULAR
  Filled 2017-12-17: qty 2

## 2017-12-17 MED ORDER — OXYCODONE HCL 5 MG PO TABS
5.0000 mg | ORAL_TABLET | Freq: Four times a day (QID) | ORAL | Status: DC | PRN
  Administered 2017-12-17: 5 mg via ORAL
  Filled 2017-12-17: qty 1

## 2017-12-17 MED ORDER — METHOCARBAMOL 500 MG PO TABS: 500.0000 mg | ORAL_TABLET | Freq: Four times a day (QID) | ORAL | 0 refills | Status: DC

## 2017-12-17 NOTE — ED Notes (Signed)
Pt sent family member out of room to request this tech to lift head of bed at least a foot up more

## 2017-12-17 NOTE — Discharge Instructions (Signed)
Continue taking medication at home.  Robaxin 1 tablet every 6 hours as needed for muscle spasms.  Oxycodone 1 every 6 hours as needed for moderate pain.  You will need to follow-[facility] if any continued problems.  You may also use moist heat or ice to your back as needed for discomfort.

## 2017-12-17 NOTE — ED Provider Notes (Signed)
[facility] Emergency Department Provider Note   ____________________________________________   First MD Initiated Contact with Patient 12/17/17 1032     (approximate)  I have reviewed the triage vital signs and the nursing notes.   HISTORY  Chief Complaint Back Pain   HPI [name] is a 62 y.o. male brought in via EMS with complaint of back pain for the last 5 days.  Patient did not have an injury causing his back pain.  He states he woke up with right low back pain with some radiation into his right leg.  He denies any incontinence of bowel or bladder.  There is been no saddle anesthesias or abdominal pain.  Patient denies any urinary symptoms and no history of kidney stones.  Patient has been seen at the [facility] in the past for his back problems but in looking in his chart has not had any x-rays since 2011.    Past Medical History:  Diagnosis Date  . Atrial fibrillation (HCC)   . CAD in native artery    a. stress echo 12/2007 abnl, EF > 55%, b. LHC 01/28/08: mLAD 30, D1 40, dLCx 70, pRCA 30, mRCA 70, mRCA lesion 2 80, PDA 90, s/p PCI/BMS to prox and distal RCA, s/p PCI/BMS to PDA; c. patient reports PCI/stenting x 2 in early 2017 at the [place] (no records on file) d. 06/2016: cath showing patent stents along RCA and LCx with moderate 40% stenosis along the LAD.   [name] Chronic combined systolic (congestive) and diastolic (congestive) heart failure (HCC)    a. echo 2009: > 55% b. 06/2016: EF 25-30% by echo in 06/2016, cath showing patent stents with moderate disease along LAD  . COPD (chronic obstructive pulmonary disease) (HCC)   . Diabetes mellitus without complication (HCC)   . Gout   . Hyperlipidemia   . Hypertension   . Hypertensive heart disease   . Obesity   . Tobacco abuse     Patient Active Problem List   Diagnosis Date Noted  . Atrial fibrillation with RVR (HCC) 03/26/2017  . Atrial flutter with rapid ventricular response (HCC) 02/18/2017    . Diastolic CHF, acute on chronic (HCC) 01/06/2017  . CAD in native artery   . Chest pain 01/05/2017  . Ischemic cardiomyopathy 07/26/2016  . Non-sustained ventricular tachycardia (HCC) 07/26/2016  . COPD (chronic obstructive pulmonary disease) (HCC) 07/24/2016  . CAD (coronary artery disease) 07/24/2016  . HLD (hyperlipidemia) 07/24/2016  . Acute on chronic combined systolic and diastolic CHF (congestive heart failure) (HCC) 07/24/2016  . Acute respiratory distress 07/24/2016  . Tobacco abuse 07/24/2016  . Accelerated hypertension 07/24/2016  . Hypertensive heart disease 07/24/2016    Past Surgical History:  Procedure Laterality Date  . CARDIAC CATHETERIZATION  2009   [facility];   . CARDIAC CATHETERIZATION  2010  . CARDIAC CATHETERIZATION N/A 07/28/2016   Procedure: Right and Left Heart Cath and possible PCI;  Surgeon: Iran Ouch, MD;  Location: [facility] INVASIVE CV LAB;  Service: Cardiovascular;  Laterality: N/A;  . CARDIOVERSION N/A 03/27/2017   Procedure: CARDIOVERSION;  Surgeon: Iran Ouch, MD;  Location: [facility] ORS;  Service: Cardiovascular;  Laterality: N/A;  . CORONARY ANGIOPLASTY  2009   s/p stent placement at [facility].    Prior to Admission medications   Medication Sig Start Date End Date Taking? Authorizing Provider  albuterol-ipratropium (COMBIVENT) 18-103 MCG/ACT inhaler Inhale 2 puffs into the lungs every 4 (four) hours as needed for wheezing.  [provider]  atorvastatin (LIPITOR) 40 MG tablet Take 40 mg by mouth daily.     [provider]  budesonide-formoterol (SYMBICORT) 160-4.5 MCG/ACT inhaler Inhale 2 puffs into the lungs 2 (two) times daily.    [provider]  carvedilol (COREG) 25 MG tablet Take 1 tablet (25 mg total) by mouth 2 (two) times daily with a meal. 07/31/16   Enedina Finner, MD  docusate sodium (COLACE) 100 MG capsule Take 100 mg by mouth 2 (two) times daily.    [provider]  guaiFENesin-dextromethorphan  (ROBITUSSIN DM) 100-10 MG/5ML syrup Take 5 mLs by mouth every 4 (four) hours as needed for cough. Patient not taking: Reported on 08/07/2017 03/27/17   Shaune Pollack, MD  losartan (COZAAR) 25 MG tablet Take 1 tablet (25 mg total) by mouth daily. 01/08/17   Enedina Finner, MD  metFORMIN (GLUCOPHAGE) 1000 MG tablet Take 1,000 mg by mouth 2 (two) times daily with a meal.     [provider]  methocarbamol (ROBAXIN) 500 MG tablet Take 1 tablet (500 mg total) by mouth 4 (four) times daily. 12/17/17   Tommi Rumps, PA-C  nitroGLYCERIN (NITROSTAT) 0.4 MG SL tablet Place 0.4 mg under the tongue every 5 (five) minutes as needed for chest pain.    [provider]  oxyCODONE (OXY IR/ROXICODONE) 5 MG immediate release tablet Take 1 tablet (5 mg total) by mouth every 6 (six) hours as needed for severe pain. 12/17/17   Tommi Rumps, PA-C  oxyCODONE-acetaminophen (PERCOCET) 5-325 MG tablet Take 1-2 tablets by mouth every 8 (eight) hours as needed. Patient not taking: Reported on 08/07/2017 06/18/17   Emily Filbert, MD  predniSONE (DELTASONE) 20 MG tablet 3 tablets daily x 4 days 08/09/17   Irean Hong, MD  rivaroxaban (XARELTO) 20 MG TABS tablet Take 1 tablet (20 mg total) by mouth daily with supper. 02/19/17   Adrian Saran, MD  spironolactone (ALDACTONE) 25 MG tablet Take 1 tablet (25 mg total) by mouth daily. 08/01/16   Enedina Finner, MD  torsemide (DEMADEX) 20 MG tablet Take 2 tablets (40 mg total) by mouth daily. 01/07/17   Enedina Finner, MD  traZODone (DESYREL) 50 MG tablet Take 50 mg by mouth at bedtime.    [provider]    Allergies Aspirin; Ibuprofen; and Tylenol [acetaminophen]  Family History  Problem Relation Age of Onset  . Heart attack Mother 13    Social History Social History   Tobacco Use  . Smoking status: Former Smoker    Packs/day: 1.00    Years: 35.00    Pack years: 35.00    Types: Cigarettes  . Smokeless tobacco: Never Used  Substance Use Topics  .  Alcohol use: No    Comment: Quit 8 yrs.  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 Drug use: No    Review of Systems Constitutional: No fever/chills Cardiovascular: Denies chest pain. Respiratory: Denies shortness of breath. Gastrointestinal: No abdominal pain.  No nausea, no vomiting.  No diarrhea.  No constipation. Genitourinary: Negative for dysuria. Musculoskeletal: Positive for right low back pain.  Positive for right leg  radiculopathy. Skin: Negative for rash. Neurological: Negative for headaches, focal weakness or numbness.  ____________________________________________   PHYSICAL EXAM:  VITAL SIGNS: ED Triage Vitals  Enc Vitals Group     BP 12/17/17 1027 (!) 154/105     Pulse Rate 12/17/17 1027 96     Resp 12/17/17 1027 16     Temp 12/17/17 1027 (!)  97.5 F (36.4 C)     Temp Source 12/17/17 1027 Oral     SpO2 12/17/17 1027 99 %     Weight 12/17/17 1031 220 lb (99.8 kg)     Height 12/17/17 1031 5\' 11"  (1.803 m)     Head Circumference --      Peak Flow --      Pain Score 12/17/17 1028 10     Pain Loc --      Pain Edu? --      Excl. in GC? --     Constitutional: Alert and oriented. Well appearing and in no acute distress.  Supine on the stretcher. Eyes: Conjunctivae are normal.  Head: Atraumatic. Neck: No stridor.   Cardiovascular: Normal rate, regular rhythm. Grossly normal heart sounds.  Good peripheral circulation. Respiratory: Normal respiratory effort.  No retractions. Lungs CTAB. Gastrointestinal: Soft and nontender. No distention. No abdominal bruits. No CVA tenderness. Musculoskeletal: On examination of the back there is no gross deformity however there is tenderness on the right paravertebral muscles L5-S1 area and right SI joint and surrounding soft tissue.  Straight leg raises are limited to approximately 20 degrees bilaterally.  Range of motion is restricted secondary to pain. Neurologic:  Normal speech and language. No gross focal neurologic deficits are appreciated.   Reflexes 1+ bilaterally. Skin:  Skin is warm, dry and intact. No rash noted. Psychiatric: Mood and affect are normal. Speech and behavior are normal.  ____________________________________________   LABS (all labs ordered are listed, but only abnormal results are displayed)  Labs Reviewed  URINALYSIS, COMPLETE (UACMP) WITH MICROSCOPIC - Abnormal; Notable for the following components:      Result Value   Color, Urine YELLOW (*)    APPearance CLEAR (*)    Glucose, UA >=500 (*)    Protein, ur 100 (*)    All other components within normal limits    RADIOLOGY  ED MD interpretation:  LS-spine is positive for multiple levels of degenerative changes.  Official radiology report(s): Dg Lumbar Spine 2-3 Views  Result Date: 12/17/2017 CLINICAL DATA:  Back pain with radiation down right leg. No known injury. EXAM: LUMBAR SPINE - 2-3 VIEW COMPARISON:  CT 01/15/2012.  KUB and upright abdomen 01/15/2012. FINDINGS: Diffuse multilevel degenerative change. No acute bony abnormality. No evidence of fracture. Aortoiliac and renal vascular atherosclerotic vascular calcification. IMPRESSION: 1. Diffuse multilevel degenerative change. No acute bony abnormality. 2.  Aortoiliac and renal vascular atherosclerotic vascular disease. Electronically Signed   By: Maisie Fushomas  Register   On: 12/17/2017 12:13    ____________________________________________   PROCEDURES  Procedure(s) performed: None  Procedures  Critical Care performed: No  ____________________________________________   INITIAL IMPRESSION / ASSESSMENT AND PLAN / ED COURSE  As part of my medical decision making, I reviewed the following data within the electronic MEDICAL RECORD NUMBER Notes from prior ED visits and [place] Controlled Substance Database   ----------------------------------------- 1:50 PM on 12/17/2017 ----------------------------------------- Prior to discharge patient was up sitting on the stretcher eating chicken.  He states that  although he has been given medication he still continues to be in pain.  Patient also is fighting with family member who is come to pick him up.  Patient was given methocarbamol 1000 mg p.o. and oxycodone 5 mg immediate release.  Patient was given a prescription for the same and encouraged to use moist heat or ice to his back as needed for discomfort.  He is encouraged to [facility].  ____________________________________________   FINAL CLINICAL IMPRESSION(S) / ED DIAGNOSES  Final diagnoses:  Acute right-sided low back pain with right-sided sciatica     ED Discharge Orders        Ordered    methocarbamol (ROBAXIN) 500 MG tablet  4 times daily     12/17/17 1348    oxyCODONE (OXY IR/ROXICODONE) 5 MG immediate release tablet  Every 6 hours PRN     12/17/17 1348       Note:  This document was prepared using Dragon voice recognition software and may include unintentional dictation errors.    Tommi Rumps, PA-C 12/17/17 1617    Emily Filbert, MD 12/18/17 [phone]

## 2017-12-17 NOTE — ED Triage Notes (Signed)
Says he has back pain for about 5 days. Has had before, but it feels like a pinched nerve.

## 2018-01-19 ENCOUNTER — Encounter: Payer: Self-pay | Admitting: Emergency Medicine

## 2018-01-19 ENCOUNTER — Other Ambulatory Visit: Payer: Self-pay

## 2018-01-19 ENCOUNTER — Inpatient Hospital Stay
Admission: EM | Admit: 2018-01-19 | Discharge: 2018-01-21 | DRG: 291 | Disposition: A | Discharge status: Home / Self Care | Payer: No Typology Code available for payment source | Attending: Internal Medicine | Admitting: Internal Medicine

## 2018-01-19 ENCOUNTER — Emergency Department: Payer: No Typology Code available for payment source

## 2018-01-19 DIAGNOSIS — Z7984 Long term (current) use of oral hypoglycemic drugs: Secondary | ICD-10-CM

## 2018-01-19 DIAGNOSIS — J9601 Acute respiratory failure with hypoxia: Secondary | ICD-10-CM | POA: Diagnosis present

## 2018-01-19 DIAGNOSIS — J441 Chronic obstructive pulmonary disease with (acute) exacerbation: Secondary | ICD-10-CM | POA: Diagnosis present

## 2018-01-19 DIAGNOSIS — E876 Hypokalemia: Secondary | ICD-10-CM | POA: Diagnosis not present

## 2018-01-19 DIAGNOSIS — R310 Gross hematuria: Secondary | ICD-10-CM | POA: Diagnosis not present

## 2018-01-19 DIAGNOSIS — I2510 Atherosclerotic heart disease of native coronary artery without angina pectoris: Secondary | ICD-10-CM | POA: Diagnosis present

## 2018-01-19 DIAGNOSIS — Z955 Presence of coronary angioplasty implant and graft: Secondary | ICD-10-CM | POA: Diagnosis not present

## 2018-01-19 DIAGNOSIS — Z7951 Long term (current) use of inhaled steroids: Secondary | ICD-10-CM

## 2018-01-19 DIAGNOSIS — Z886 Allergy status to analgesic agent status: Secondary | ICD-10-CM

## 2018-01-19 DIAGNOSIS — I493 Ventricular premature depolarization: Secondary | ICD-10-CM | POA: Diagnosis present

## 2018-01-19 DIAGNOSIS — I483 Typical atrial flutter: Secondary | ICD-10-CM | POA: Diagnosis present

## 2018-01-19 DIAGNOSIS — N179 Acute kidney failure, unspecified: Secondary | ICD-10-CM | POA: Diagnosis present

## 2018-01-19 DIAGNOSIS — Z7901 Long term (current) use of anticoagulants: Secondary | ICD-10-CM

## 2018-01-19 DIAGNOSIS — R778 Other specified abnormalities of plasma proteins: Secondary | ICD-10-CM

## 2018-01-19 DIAGNOSIS — D696 Thrombocytopenia, unspecified: Secondary | ICD-10-CM | POA: Diagnosis present

## 2018-01-19 DIAGNOSIS — Z87891 Personal history of nicotine dependence: Secondary | ICD-10-CM

## 2018-01-19 DIAGNOSIS — R748 Abnormal levels of other serum enzymes: Secondary | ICD-10-CM | POA: Diagnosis present

## 2018-01-19 DIAGNOSIS — I5023 Acute on chronic systolic (congestive) heart failure: Secondary | ICD-10-CM | POA: Diagnosis not present

## 2018-01-19 DIAGNOSIS — E1122 Type 2 diabetes mellitus with diabetic chronic kidney disease: Secondary | ICD-10-CM | POA: Diagnosis present

## 2018-01-19 DIAGNOSIS — I480 Paroxysmal atrial fibrillation: Secondary | ICD-10-CM | POA: Diagnosis present

## 2018-01-19 DIAGNOSIS — I472 Ventricular tachycardia: Secondary | ICD-10-CM | POA: Diagnosis present

## 2018-01-19 DIAGNOSIS — E663 Overweight: Secondary | ICD-10-CM | POA: Diagnosis present

## 2018-01-19 DIAGNOSIS — N390 Urinary tract infection, site not specified: Secondary | ICD-10-CM

## 2018-01-19 DIAGNOSIS — I272 Pulmonary hypertension, unspecified: Secondary | ICD-10-CM | POA: Diagnosis present

## 2018-01-19 DIAGNOSIS — I4439 Other atrioventricular block: Secondary | ICD-10-CM | POA: Diagnosis present

## 2018-01-19 DIAGNOSIS — I130 Hypertensive heart and chronic kidney disease with heart failure and stage 1 through stage 4 chronic kidney disease, or unspecified chronic kidney disease: Principal | ICD-10-CM | POA: Diagnosis present

## 2018-01-19 DIAGNOSIS — I248 Other forms of acute ischemic heart disease: Secondary | ICD-10-CM | POA: Diagnosis not present

## 2018-01-19 DIAGNOSIS — Z8249 Family history of ischemic heart disease and other diseases of the circulatory system: Secondary | ICD-10-CM

## 2018-01-19 DIAGNOSIS — I4892 Unspecified atrial flutter: Secondary | ICD-10-CM | POA: Diagnosis not present

## 2018-01-19 DIAGNOSIS — N3001 Acute cystitis with hematuria: Secondary | ICD-10-CM | POA: Diagnosis present

## 2018-01-19 DIAGNOSIS — R079 Chest pain, unspecified: Secondary | ICD-10-CM

## 2018-01-19 DIAGNOSIS — I5020 Unspecified systolic (congestive) heart failure: Secondary | ICD-10-CM

## 2018-01-19 DIAGNOSIS — N182 Chronic kidney disease, stage 2 (mild): Secondary | ICD-10-CM | POA: Diagnosis present

## 2018-01-19 DIAGNOSIS — E785 Hyperlipidemia, unspecified: Secondary | ICD-10-CM | POA: Diagnosis present

## 2018-01-19 DIAGNOSIS — I340 Nonrheumatic mitral (valve) insufficiency: Secondary | ICD-10-CM | POA: Diagnosis present

## 2018-01-19 DIAGNOSIS — I5043 Acute on chronic combined systolic (congestive) and diastolic (congestive) heart failure: Secondary | ICD-10-CM | POA: Diagnosis present

## 2018-01-19 DIAGNOSIS — Z79899 Other long term (current) drug therapy: Secondary | ICD-10-CM

## 2018-01-19 DIAGNOSIS — Z6829 Body mass index (BMI) 29.0-29.9, adult: Secondary | ICD-10-CM

## 2018-01-19 DIAGNOSIS — R7989 Other specified abnormal findings of blood chemistry: Secondary | ICD-10-CM

## 2018-01-19 DIAGNOSIS — B9620 Unspecified Escherichia coli [E. coli] as the cause of diseases classified elsewhere: Secondary | ICD-10-CM | POA: Diagnosis present

## 2018-01-19 DIAGNOSIS — R319 Hematuria, unspecified: Secondary | ICD-10-CM

## 2018-01-19 LAB — BRAIN NATRIURETIC PEPTIDE: B NATRIURETIC PEPTIDE 5: 935 pg/mL — AB (ref 0.0–100.0)

## 2018-01-19 LAB — URINALYSIS, COMPLETE (UACMP) WITH MICROSCOPIC
BILIRUBIN URINE: NEGATIVE
Bacteria, UA: NONE SEEN
KETONES UR: NEGATIVE mg/dL
NITRITE: POSITIVE — AB
PH: 6 (ref 5.0–8.0)
Protein, ur: 100 mg/dL — AB
RBC / HPF: >50 RBC/hpf — ABNORMAL HIGH (ref 0–5)
Specific Gravity, Urine: 1.028 (ref 1.005–1.030)
WBC, UA: >50 WBC/hpf — ABNORMAL HIGH (ref 0–5)

## 2018-01-19 LAB — BASIC METABOLIC PANEL
ANION GAP: 6 (ref 5–15)
BUN: 14 mg/dL (ref 8–23)
CHLORIDE: 110 mmol/L (ref 98–111)
CO2: 27 mmol/L (ref 22–32)
Calcium: 9.5 mg/dL (ref 8.9–10.3)
Creatinine, Ser: 1.25 mg/dL — ABNORMAL HIGH (ref 0.61–1.24)
GFR calc Af Amer: >60 mL/min (ref >60)
GFR calc non Af Amer: >60 mL/min (ref >60)
GLUCOSE: 124 mg/dL — AB (ref 70–99)
POTASSIUM: 4 mmol/L (ref 3.5–5.1)
Sodium: 143 mmol/L (ref 135–145)

## 2018-01-19 LAB — CBC
HEMATOCRIT: 45.6 % (ref 40.0–52.0)
Hemoglobin: 15 g/dL (ref 13.0–18.0)
MCH: 29.7 pg (ref 26.0–34.0)
MCHC: 33 g/dL (ref 32.0–36.0)
MCV: 90.1 fL (ref 80.0–100.0)
Platelets: 124 10*3/uL — ABNORMAL LOW (ref 150–440)
RBC: 5.06 MIL/uL (ref 4.40–5.90)
RDW: 18.5 % — AB (ref 11.5–14.5)
WBC: 7.7 10*3/uL (ref 3.8–10.6)

## 2018-01-19 LAB — TROPONIN I
Troponin I: 0.06 ng/mL (ref <0.03)
Troponin I: 0.07 ng/mL (ref <0.03)

## 2018-01-19 MED ORDER — CARVEDILOL 25 MG PO TABS
25.0000 mg | ORAL_TABLET | Freq: Once | ORAL | Status: AC
  Administered 2018-01-19: 25 mg via ORAL
  Filled 2018-01-19: qty 1

## 2018-01-19 MED ORDER — SODIUM CHLORIDE 0.9 % IV SOLN
1.0000 g | INTRAVENOUS | Status: DC
  Administered 2018-01-21: 1 g via INTRAVENOUS
  Filled 2018-01-19: qty 1
  Filled 2018-01-19: qty 10

## 2018-01-19 MED ORDER — NITROGLYCERIN 2 % TD OINT
1.0000 [in_us] | TOPICAL_OINTMENT | Freq: Once | TRANSDERMAL | Status: AC
  Administered 2018-01-19: 1 [in_us] via TOPICAL
  Filled 2018-01-19: qty 1

## 2018-01-19 MED ORDER — ONDANSETRON HCL 4 MG PO TABS: 4.0000 mg | ORAL_TABLET | Freq: Four times a day (QID) | ORAL | Status: DC | PRN

## 2018-01-19 MED ORDER — DOCUSATE SODIUM 100 MG PO CAPS
100.0000 mg | ORAL_CAPSULE | Freq: Two times a day (BID) | ORAL | Status: DC
  Administered 2018-01-20 – 2018-01-21 (×4): 100 mg via ORAL
  Filled 2018-01-19 (×4): qty 1

## 2018-01-19 MED ORDER — TRAZODONE HCL 50 MG PO TABS
50.0000 mg | ORAL_TABLET | Freq: Every day | ORAL | Status: DC
  Administered 2018-01-20 (×2): 50 mg via ORAL
  Filled 2018-01-19 (×2): qty 1

## 2018-01-19 MED ORDER — SODIUM CHLORIDE 0.9 % IV SOLN
1.0000 g | Freq: Once | INTRAVENOUS | Status: AC
  Administered 2018-01-19: 1 g via INTRAVENOUS
  Filled 2018-01-19: qty 10

## 2018-01-19 MED ORDER — FUROSEMIDE 10 MG/ML IJ SOLN
40.0000 mg | Freq: Once | INTRAMUSCULAR | Status: AC
  Administered 2018-01-19: 40 mg via INTRAVENOUS
  Filled 2018-01-19: qty 4

## 2018-01-19 MED ORDER — INSULIN ASPART 100 UNIT/ML ~~LOC~~ SOLN: 0.0000 [IU] | Freq: Every day | SUBCUTANEOUS | Status: DC

## 2018-01-19 MED ORDER — ONDANSETRON HCL 4 MG/2ML IJ SOLN: 4.0000 mg | Freq: Four times a day (QID) | INTRAMUSCULAR | Status: DC | PRN

## 2018-01-19 MED ORDER — BISACODYL 5 MG PO TBEC: 5.0000 mg | DELAYED_RELEASE_TABLET | Freq: Every day | ORAL | Status: DC | PRN

## 2018-01-19 MED ORDER — COMBIVENT 18-103 MCG/ACT IN AERO: 2.0000 | INHALATION_SPRAY | RESPIRATORY_TRACT | Status: DC | PRN

## 2018-01-19 MED ORDER — HYDROCODONE-ACETAMINOPHEN 5-325 MG PO TABS: 1.0000 | ORAL_TABLET | ORAL | Status: DC | PRN

## 2018-01-19 MED ORDER — ASPIRIN 81 MG PO CHEW
324.0000 mg | CHEWABLE_TABLET | Freq: Once | ORAL | Status: AC
  Administered 2018-01-19: 324 mg via ORAL
  Filled 2018-01-19: qty 4

## 2018-01-19 NOTE — ED Triage Notes (Signed)
PT arrived with complaints of chest pain that started this morning at 330. Pt reports the pain as a dull sensation that is midsternum. Pt appears in NAD

## 2018-01-19 NOTE — ED Provider Notes (Addendum)
[facility] Emergency Department Provider Note   ____________________________________________   First MD Initiated Contact with Patient 01/19/18 1559     (approximate)  I have reviewed the triage vital signs and the nursing notes.   HISTORY  Chief Complaint Chest Pain    HPI [name] is a 62 y.o. male reports she was coming up with his wife from [place].  He got up early this morning could not catch his breath he has some chest tightness he was urinating blood.  He takes Eliquis.  Has a history of COPD CHF and A. fib.  Review of the patient's old records show he has a chronically elevated troponin.  His troponin is elevated now.  He still having some chest tightness.  He is having some shortness of breath to but his O2 sats are 97 and is laying flat in the emergency room.  Chest tightness is mild.   Past Medical History:  Diagnosis Date  . Atrial fibrillation (HCC)   . CAD in native artery    a. stress echo 12/2007 abnl, EF > 55%, b. LHC 01/28/08: mLAD 30, D1 40, dLCx 70, pRCA 30, mRCA 70, mRCA lesion 2 80, PDA 90, s/p PCI/BMS to prox and distal RCA, s/p PCI/BMS to PDA; c. patient reports PCI/stenting x 2 in early 2017 at the [place] (no records on file) d. 06/2016: cath showing patent stents along RCA and LCx with moderate 40% stenosis along the LAD.   [name] Chronic combined systolic (congestive) and diastolic (congestive) heart failure (HCC)    a. echo 2009: > 55% b. 06/2016: EF 25-30% by echo in 06/2016, cath showing patent stents with moderate disease along LAD  . COPD (chronic obstructive pulmonary disease) (HCC)   . Diabetes mellitus without complication (HCC)   . Gout   . Hyperlipidemia   . Hypertension   . Hypertensive heart disease   . Obesity   . Tobacco abuse     Patient Active Problem List   Diagnosis Date Noted  . Atrial fibrillation with RVR (HCC) 03/26/2017  . Atrial flutter with rapid ventricular response (HCC) 02/18/2017  . Diastolic  CHF, acute on chronic (HCC) 01/06/2017  . CAD in native artery   . Chest pain 01/05/2017  . Ischemic cardiomyopathy 07/26/2016  . Non-sustained ventricular tachycardia (HCC) 07/26/2016  . COPD (chronic obstructive pulmonary disease) (HCC) 07/24/2016  . CAD (coronary artery disease) 07/24/2016  . HLD (hyperlipidemia) 07/24/2016  . Acute on chronic combined systolic and diastolic CHF (congestive heart failure) (HCC) 07/24/2016  . Acute respiratory distress 07/24/2016  . Tobacco abuse 07/24/2016  . Accelerated hypertension 07/24/2016  . Hypertensive heart disease 07/24/2016    Past Surgical History:  Procedure Laterality Date  . CARDIAC CATHETERIZATION  2009   [facility];   . CARDIAC CATHETERIZATION  2010  . CARDIAC CATHETERIZATION N/A 07/28/2016   Procedure: Right and Left Heart Cath and possible PCI;  Surgeon: Iran Ouch, MD;  Location: [facility] INVASIVE CV LAB;  Service: Cardiovascular;  Laterality: N/A;  . CARDIOVERSION N/A 03/27/2017   Procedure: CARDIOVERSION;  Surgeon: Iran Ouch, MD;  Location: [facility] ORS;  Service: Cardiovascular;  Laterality: N/A;  . CORONARY ANGIOPLASTY  2009   s/p stent placement at [facility].    Prior to Admission medications   Medication Sig Start Date End Date Taking? Authorizing Provider  albuterol-ipratropium (COMBIVENT) 18-103 MCG/ACT inhaler Inhale 2 puffs into the lungs every 4 (four) hours as needed for wheezing.    [provider]  atorvastatin (LIPITOR) 40 MG tablet Take 40 mg by mouth daily.     [provider]  budesonide-formoterol (SYMBICORT) 160-4.5 MCG/ACT inhaler Inhale 2 puffs into the lungs 2 (two) times daily.    [provider]  carvedilol (COREG) 25 MG tablet Take 1 tablet (25 mg total) by mouth 2 (two) times daily with a meal. 07/31/16   Enedina Finner, MD  docusate sodium (COLACE) 100 MG capsule Take 100 mg by mouth 2 (two) times daily.    [provider]  guaiFENesin-dextromethorphan (ROBITUSSIN DM) 100-10  MG/5ML syrup Take 5 mLs by mouth every 4 (four) hours as needed for cough. Patient not taking: Reported on 08/07/2017 03/27/17   Shaune Pollack, MD  losartan (COZAAR) 25 MG tablet Take 1 tablet (25 mg total) by mouth daily. 01/08/17   Enedina Finner, MD  metFORMIN (GLUCOPHAGE) 1000 MG tablet Take 1,000 mg by mouth 2 (two) times daily with a meal.     [provider]  methocarbamol (ROBAXIN) 500 MG tablet Take 1 tablet (500 mg total) by mouth 4 (four) times daily. 12/17/17   Tommi Rumps, PA-C  nitroGLYCERIN (NITROSTAT) 0.4 MG SL tablet Place 0.4 mg under the tongue every 5 (five) minutes as needed for chest pain.    [provider]  oxyCODONE (OXY IR/ROXICODONE) 5 MG immediate release tablet Take 1 tablet (5 mg total) by mouth every 6 (six) hours as needed for severe pain. 12/17/17   Tommi Rumps, PA-C  oxyCODONE-acetaminophen (PERCOCET) 5-325 MG tablet Take 1-2 tablets by mouth every 8 (eight) hours as needed. Patient not taking: Reported on 08/07/2017 06/18/17   Emily Filbert, MD  predniSONE (DELTASONE) 20 MG tablet 3 tablets daily x 4 days 08/09/17   Irean Hong, MD  rivaroxaban (XARELTO) 20 MG TABS tablet Take 1 tablet (20 mg total) by mouth daily with supper. 02/19/17   Adrian Saran, MD  spironolactone (ALDACTONE) 25 MG tablet Take 1 tablet (25 mg total) by mouth daily. 08/01/16   Enedina Finner, MD  torsemide (DEMADEX) 20 MG tablet Take 2 tablets (40 mg total) by mouth daily. 01/07/17   Enedina Finner, MD  traZODone (DESYREL) 50 MG tablet Take 50 mg by mouth at bedtime.    [provider]    Allergies Aspirin; Ibuprofen; and Tylenol [acetaminophen]  Family History  Problem Relation Age of Onset  . Heart attack Mother 62    Social History Social History   Tobacco Use  . Smoking status: Former Smoker    Packs/day: 1.00    Years: 35.00    Pack years: 35.00    Types: Cigarettes  . Smokeless tobacco: Never Used  Substance Use Topics  . Alcohol use: No     Comment: Quit 8 yrs.  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 Drug use: No    Review of Systems  Constitutional: No fever/chills Eyes: No visual changes. ENT: No sore throat. Cardiovascular: See HPI Respiratory: See HPI Gastrointestinal: No abdominal pain.  No nausea, no vomiting.  No diarrhea.  No constipation. Genitourinary: Negative for dysuria.  He does complain of hematuria. Musculoskeletal: Negative for back pain. Skin: Negative for rash. Neurological: Negative for headaches, focal weakness   ____________________________________________   PHYSICAL EXAM:  VITAL SIGNS: ED Triage Vitals  Enc Vitals Group     BP 01/19/18 1438 (!) 184/100     Pulse Rate 01/19/18 1438 74     Resp 01/19/18 1438 20     Temp 01/19/18 1438 98.5 F (36.9  C)     Temp Source 01/19/18 1438 Oral     SpO2 01/19/18 1438 96 %     Weight 01/19/18 1438 220 lb (99.8 kg)     Height 01/19/18 1438 6' (1.829 m)     Head Circumference --      Peak Flow --      Pain Score 01/19/18 1453 7     Pain Loc --      Pain Edu? --      Excl. in GC? --     Constitutional: Alert and oriented. Well appearing and in no acute distress. Eyes: Conjunctivae are normal.  Head: Atraumatic. Nose: No congestion/rhinnorhea. Mouth/Throat: Mucous membranes are moist.  Oropharynx non-erythematous. Neck: No stridor. Cardiovascular: Normal rate, regular rhythm. Grossly normal heart sounds.  Good peripheral circulation. Respiratory: Normal respiratory effort.  No retractions. Lungs CTAB. Gastrointestinal: Soft and nontender. No distention. No abdominal bruits. No CVA tenderness. Musculoskeletal: No lower extremity tenderness nor edema.. Neurologic:  Normal speech and language. No gross focal neurologic deficits are appreciated.  Skin:  Skin is warm, dry and intact. No rash noted. Psychiatric: Mood and affect are normal. Speech and behavior are normal.  ____________________________________________   LABS (all labs ordered are listed, but  only abnormal results are displayed)  Labs Reviewed  BASIC METABOLIC PANEL - Abnormal; Notable for the following components:      Result Value   Glucose, Bld 124 (*)    Creatinine, Ser 1.25 (*)    All other components within normal limits  CBC - Abnormal; Notable for the following components:   RDW 18.5 (*)    Platelets 124 (*)    All other components within normal limits  TROPONIN I - Abnormal; Notable for the following components:   Troponin I 0.06 (*)    All other components within normal limits  TROPONIN I - Abnormal; Notable for the following components:   Troponin I 0.07 (*)    All other components within normal limits  BRAIN NATRIURETIC PEPTIDE - Abnormal; Notable for the following components:   B Natriuretic Peptide 935.0 (*)    All other components within normal limits  URINALYSIS, COMPLETE (UACMP) WITH MICROSCOPIC - Abnormal; Notable for the following components:   Color, Urine AMBER (*)    APPearance CLOUDY (*)    Glucose, UA >=500 (*)    Hgb urine dipstick MODERATE (*)    Protein, ur 100 (*)    Nitrite POSITIVE (*)    Leukocytes, UA TRACE (*)    RBC / HPF >50 (*)    WBC, UA >50 (*)    All other components within normal limits  URINE CULTURE   ____________________________________________  EKG  EKG read interpreted by me shows a flutter at a rate of 86 left axis less ST segment depression T wave inversion and then there was in February 2019 ____________________________________________  RADIOLOGY  ED MD interpretation: X-ray read by radiology reviewed by me does show cardiomegaly with some changes consistent with CHF.  Official radiology report(s): Dg Chest 2 View  Result Date: 01/19/2018 CLINICAL DATA:  Chest pain began today, dull sensation in the mid sternum EXAM: CHEST - 2 VIEW COMPARISON:  Chest x-ray of 08/08/2016 FINDINGS: There is some indistinctness of perihilar vasculature and there may be a small amount of fluid in the fissures on the lateral view  suggesting a mild degree of pulmonary vascular congestion. No frank edema is seen and no pleural effusion is noted. Mild cardiomegaly is stable. No acute bony  abnormality is seen. IMPRESSION: Probable mild pulmonary vascular congestion.  Cardiomegaly Electronically Signed   By: Dwyane Dee M.D.   On: 01/19/2018 15:37    ____________________________________________   PROCEDURES  Procedure(s) performed:   Procedures  Critical Care performed:   ____________________________________________   INITIAL IMPRESSION / ASSESSMENT AND PLAN / ED COURSE Patient with chest pain shortness of breath and hematuria.  He has chronically elevated troponins troponin is elevated to higher part of his normal range.  His BNP is elevated however and he is short of breath chest x-ray also says shows congestive heart failure.  In view of his chest pain together with the shortness of breath which is worse than usual and his elevated BNP and troponin and hematuria with UTI I think we will give him some IV antibiotics and observe him overnight.  We will also give him some IV Lasix.  Discussed patient with Dr. and cardiology.  He feels keeping on the Eliquis at present especially with his blood pressure being as high as it is will be sufficient do not heparinize him.  We will work on getting his blood pressure down.  We had recently finished giving him the Lasix and the Nitropaste his blood pressure is starting to trend down we will keep an eye on that if it does not go down for that we will either give him more Lasix or more nitro or both.  I will sign him out to Dr. Stan Head here shortly [facility]     ____________________________________________   FINAL CLINICAL IMPRESSION(S) / ED DIAGNOSES  Final diagnoses:  Chest pain, unspecified type  Elevated troponin  Systolic congestive heart failure, unspecified HF chronicity (HCC)  Urinary tract infection with hematuria, site unspecified     ED Discharge  Orders    None       Note:  This document was prepared using Dragon voice recognition software and may include unintentional dictation errors.    Arnaldo Natal, MD 01/19/18 1859    Arnaldo Natal, MD 01/19/18 2046 I need to add that [facility] and [facility] said they had an emergency and will get back to [place].   Arnaldo Natal, MD 01/19/18 2046 ----------------------------------------- 9:47 PM on 01/19/2018 -----------------------------------------  Dr. Marjory Lies H calls back from the [facility] and says they do not have any beds at this time we will therefore admit the patient here   Arnaldo Natal, MD 01/19/18 2147

## 2018-01-19 NOTE — ED Notes (Signed)
Floor unable to take pt due to staffing.

## 2018-01-19 NOTE — ED Notes (Signed)
This RN attempted IV access x1. Pt pulled away then said "you are not coming at me with that big of a needle." One attempt with a 20g unsuccessful. Pt getting angry. This RN told him I would find someone else to attempt.   Amy, charge RN asked to attempt.

## 2018-01-19 NOTE — ED Notes (Addendum)
Pt reports waking up early, couldn't catch his breath. Chest tightness reported. Pt has hx COPD, CHF. Lung sounds diminished bilaterally at this time. Hx afib. Takes eliquis.  Pt also reports starting to urinate blood this morning upon waking.

## 2018-01-19 NOTE — H&P (Addendum)
[facility] [place] [facility]   PATIENT NAME: [name]    MR#:  [id number]  DATE OF BIRTH:  [birth year]  DATE OF ADMISSION:  01/19/2018  PRIMARY CARE PHYSICIAN: Administration, Veterans   REQUESTING/REFERRING PHYSICIAN:   CHIEF COMPLAINT:   Chief Complaint  Patient presents with  . Chest Pain    HISTORY OF PRESENT ILLNESS: [name]  is a 62 y.o. male, veteran, with a known history of atrial fibrillation, on chronic anticoagulation with Eliquis, CAD, COPD, diabetes type 2, hypertension, pulmonary hypertension and other comorbidities. Patient presented to emergency room for acute onset of shortness of breath, associated with chest tightness, worsening with exertion, going on for the past 24 hours, gradually getting worse.  Patient also noted blood in urine, earlier today.  Patient denies any fever or chills, no pain with urination. His oxygen saturation was 97% on room air and rest, while in the emergency room. Blood test done emergency room revealed borderline elevated troponin level is 0.06, which, per record review, seems to be the baseline in this patient.  Creatinine level is elevated at 1.25.  BNP is elevated at 935. Chest x-ray, reviewed by myself, is suggestive pulmonary vascular congestion. EKG, reviewed by myself, shows atrial flutter at a rate of 86 bpm, left axis deviation, no acute ischemic changes. Patient is admitted for further evaluation and treatment.    PAST MEDICAL HISTORY:   Past Medical History:  Diagnosis Date  . Atrial fibrillation (HCC)   . CAD in native artery    a. stress echo 12/2007 abnl, EF > 55%, b. LHC 01/28/08: mLAD 30, D1 40, dLCx 70, pRCA 30, mRCA 70, mRCA lesion 2 80, PDA 90, s/p PCI/BMS to prox and distal RCA, s/p PCI/BMS to PDA; c. patient [facility] (no records on file) d. 06/2016: cath showing patent stents along RCA and LCx with moderate 40% stenosis along the LAD.   [name].  Chronic combined systolic (congestive) and diastolic (congestive) heart failure (HCC)    a. echo 2009: > 55% b. 06/2016: EF 25-30% by echo in 06/2016, cath showing patent stents with moderate disease along LAD  . COPD (chronic obstructive pulmonary disease) (HCC)   . Diabetes mellitus without complication (HCC)   . Gout   . Hyperlipidemia   . Hypertension   . Hypertensive heart disease   . Obesity   . Tobacco abuse     PAST SURGICAL HISTORY:  Past Surgical History:  Procedure Laterality Date  . CARDIAC CATHETERIZATION  2009   [facility];   . CARDIAC CATHETERIZATION  2010  . CARDIAC CATHETERIZATION N/A 07/28/2016   Procedure: Right and Left Heart Cath and possible PCI;  Surgeon: Iran OuchMuhammad A Arida, MD;  Location: [facility] INVASIVE CV LAB;  Service: Cardiovascular;  Laterality: N/A;  . CARDIOVERSION N/A 03/27/2017   Procedure: CARDIOVERSION;  Surgeon: Iran OuchArida, Muhammad A, MD;  Location: [facility] ORS;  Service: Cardiovascular;  Laterality: N/A;  . CORONARY ANGIOPLASTY  2009   [facility].    SOCIAL HISTORY:  Social History   Tobacco Use  . Smoking status: Former Smoker    Packs/day: 1.00    Years: 35.00    Pack years: 35.00    Types: Cigarettes  . Smokeless tobacco: Never Used  Substance Use Topics  . Alcohol use: No    Comment: Quit 8 yrs.  Used to drink heavily    FAMILY HISTORY:  Family History  Problem Relation  Age of Onset  . Heart attack Mother 32    DRUG ALLERGIES:  Allergies  Allergen Reactions  . Aspirin Other (See Comments)    Told not to take because of blood thinner  . Ibuprofen Nausea And Vomiting  . Tylenol [Acetaminophen] Nausea And Vomiting    REVIEW OF SYSTEMS:   CONSTITUTIONAL: No fever, but patient complains of fatigue and generalized weakness.  EYES: No blurred or double vision.  EARS, NOSE, AND THROAT: No tinnitus or ear pain.  RESPIRATORY: Positive for shortness of breath.  No cough, wheezing or hemoptysis.  CARDIOVASCULAR: Positive for  chest tightness.  No orthopnea, edema.  GASTROINTESTINAL: No nausea, vomiting, diarrhea or abdominal pain.  GENITOURINARY: Positive for hematuria.  ENDOCRINE: No polyuria, nocturia,  HEMATOLOGY: Positive for blood in urine SKIN: No rash or lesion. MUSCULOSKELETAL: No joint pain at this time.   NEUROLOGIC: No focal weakness.  PSYCHIATRY: No anxiety or depression.   MEDICATIONS AT HOME:  Prior to Admission medications   Medication Sig Start Date End Date Taking? Authorizing Provider  albuterol-ipratropium (COMBIVENT) 18-103 MCG/ACT inhaler Inhale 2 puffs into the lungs every 4 (four) hours as needed for wheezing.   Yes [provider]  atorvastatin (LIPITOR) 40 MG tablet Take 40 mg by mouth daily.    Yes [provider]  budesonide-formoterol (SYMBICORT) 160-4.5 MCG/ACT inhaler Inhale 2 puffs into the lungs 2 (two) times daily.   Yes [provider]  carvedilol (COREG) 25 MG tablet Take 1 tablet (25 mg total) by mouth 2 (two) times daily with a meal. 07/31/16  Yes Enedina Finner, MD  docusate sodium (COLACE) 100 MG capsule Take 100 mg by mouth 2 (two) times daily.   Yes [provider]  losartan (COZAAR) 25 MG tablet Take 1 tablet (25 mg total) by mouth daily. 01/08/17  Yes Enedina Finner, MD  metFORMIN (GLUCOPHAGE) 1000 MG tablet Take 1,000 mg by mouth 2 (two) times daily with a meal.    Yes [provider]  methocarbamol (ROBAXIN) 500 MG tablet Take 1 tablet (500 mg total) by mouth 4 (four) times daily. 12/17/17  Yes Tommi Rumps, PA-C  nitroGLYCERIN (NITROSTAT) 0.4 MG SL tablet Place 0.4 mg under the tongue every 5 (five) minutes as needed for chest pain.   Yes [provider]  oxyCODONE (OXY IR/ROXICODONE) 5 MG immediate release tablet Take 1 tablet (5 mg total) by mouth every 6 (six) hours as needed for severe pain. 12/17/17  Yes Tommi Rumps, PA-C  rivaroxaban (XARELTO) 20 MG TABS tablet Take 1 tablet (20 mg total) by mouth daily with  supper. 02/19/17  Yes Adrian Saran, MD  spironolactone (ALDACTONE) 25 MG tablet Take 1 tablet (25 mg total) by mouth daily. 08/01/16  Yes Enedina Finner, MD  torsemide (DEMADEX) 20 MG tablet Take 2 tablets (40 mg total) by mouth daily. 01/07/17  Yes Enedina Finner, MD  traZODone (DESYREL) 50 MG tablet Take 50 mg by mouth at bedtime.   Yes [provider]  guaiFENesin-dextromethorphan (ROBITUSSIN DM) 100-10 MG/5ML syrup Take 5 mLs by mouth every 4 (four) hours as needed for cough. Patient not taking: Reported on 08/07/2017 03/27/17   Shaune Pollack, MD  oxyCODONE-acetaminophen (PERCOCET) 5-325 MG tablet Take 1-2 tablets by mouth every 8 (eight) hours as needed. Patient not taking: Reported on 08/07/2017 06/18/17   Emily Filbert, MD  predniSONE (DELTASONE) 20 MG tablet 3 tablets daily x 4 days Patient not taking: Reported on 01/19/2018 08/09/17   Irean Hong,  MD      PHYSICAL EXAMINATION:   VITAL SIGNS: Blood pressure (!) 156/94, pulse 74, temperature 98.5 F (36.9 C), temperature source Oral, resp. rate 16, height 6' (1.829 m), weight 99.8 kg (220 lb), SpO2 98 %.  GENERAL:  62 y.o.-year-old patient lying in the bed with mild respite distress, at rest.  Is laying down at 60 degrees. EYES: Pupils equal, round, reactive to light and accommodation. No scleral icterus. Extraocular muscles intact.  HEENT: Head atraumatic, normocephalic. Oropharynx and nasopharynx clear.  NECK:  Supple, no jugular venous distention. No thyroid enlargement, no tenderness.  LUNGS: Reduced breath sounds bilaterally.  Bibasilar crackles are heard on auscultation.  No use of accessory muscles of respiration.  CARDIOVASCULAR: S1, S2 normal. No S3/S4.  ABDOMEN: Soft, nontender, nondistended. Bowel sounds present. No organomegaly or mass.  EXTREMITIES: No pedal edema, cyanosis, or clubbing.  NEUROLOGIC: Cranial nerves II through XII are intact. Muscle strength 5/5 in all extremities. Sensation intact. Gait is stable.   PSYCHIATRIC: The patient is alert and oriented x 3.  SKIN: No obvious rash, lesion, or ulcer.   LABORATORY PANEL:   CBC Recent Labs  Lab 01/19/18 1439  WBC 7.7  HGB 15.0  HCT 45.6  PLT 124*  MCV 90.1  MCH 29.7  MCHC 33.0  RDW 18.5*   ------------------------------------------------------------------------------------------------------------------  Chemistries  Recent Labs  Lab 01/19/18 1439  NA 143  K 4.0  CL 110  CO2 27  GLUCOSE 124*  BUN 14  CREATININE 1.25*  CALCIUM 9.5   ------------------------------------------------------------------------------------------------------------------ estimated creatinine clearance is 75.9 mL/min (A) (by C-G formula based on SCr of 1.25 mg/dL (H)). ------------------------------------------------------------------------------------------------------------------ No results for input(s): TSH, T4TOTAL, T3FREE, THYROIDAB in the last 72 hours.  Invalid input(s): FREET3   Coagulation profile No results for input(s): INR, PROTIME in the last 168 hours. ------------------------------------------------------------------------------------------------------------------- No results for input(s): DDIMER in the last 72 hours. -------------------------------------------------------------------------------------------------------------------  Cardiac Enzymes Recent Labs  Lab 01/19/18 1439 01/19/18 1729  TROPONINI 0.06* 0.07*   ------------------------------------------------------------------------------------------------------------------ Invalid input(s): POCBNP  ---------------------------------------------------------------------------------------------------------------  Urinalysis    Component Value Date/Time   COLORURINE AMBER (A) 01/19/2018 1728   APPEARANCEUR CLOUDY (A) 01/19/2018 1728   APPEARANCEUR Hazy 12/08/2012 1005   LABSPEC 1.028 01/19/2018 1728   LABSPEC 1.028 12/08/2012 1005   PHURINE 6.0 01/19/2018 1728    GLUCOSEU >=500 (A) 01/19/2018 1728   GLUCOSEU Negative 12/08/2012 1005   HGBUR MODERATE (A) 01/19/2018 1728   BILIRUBINUR NEGATIVE 01/19/2018 1728   BILIRUBINUR Negative 12/08/2012 1005   KETONESUR NEGATIVE 01/19/2018 1728   PROTEINUR 100 (A) 01/19/2018 1728   NITRITE POSITIVE (A) 01/19/2018 1728   LEUKOCYTESUR TRACE (A) 01/19/2018 1728   LEUKOCYTESUR Negative 12/08/2012 1005     RADIOLOGY: Dg Chest 2 View  Result Date: 01/19/2018 CLINICAL DATA:  Chest pain began today, dull sensation in the mid sternum EXAM: CHEST - 2 VIEW COMPARISON:  Chest x-ray of 08/08/2016 FINDINGS: There is some indistinctness of perihilar vasculature and there may be a small amount of fluid in the fissures on the lateral view suggesting a mild degree of pulmonary vascular congestion. No frank edema is seen and no pleural effusion is noted. Mild cardiomegaly is stable. No acute bony abnormality is seen. IMPRESSION: Probable mild pulmonary vascular congestion.  Cardiomegaly Electronically Signed   By: Dwyane Dee M.D.   On: 01/19/2018 15:37    EKG: Orders placed or performed during the hospital encounter of 01/19/18  . EKG 12-Lead  . EKG 12-Lead  . ED EKG within  10 minutes  . ED EKG within 10 minutes  . Repeat EKG  . Repeat EKG    IMPRESSION AND PLAN:  1.  Acute respiratory distress secondary to acute CHF exacerbation.  We will start treatment with Lasix IV.  Will check 2D echo.  Will consult cardiology for further evaluation and treatment. 2.  Acute on chronic combined systolic and diastolic CHF.  Most recent 2D echo report was from a year ago and it showed 25% to 30% ejection fraction.  We will repeat 2D echo for further evaluate the heart function. 3.  Chest pain, likely related to acute CHF exacerbation.  Troponin level, although slightly elevated, is at baseline.  Continue to monitor patient on telemetry.  Will have cardiology further evaluate the patient. 4.  Acute UTI, will start treatment with IV  ceftriaxone. 5.  Hematuria, could be related to acute UTI.  Hemoglobin level is stable at 15, will continue to monitor.  Will check kidneys and urinary bladder ultrasound for further evaluation.  Will consult urology for further evaluation and treatment. 6.  Paroxysmal atrial fibrillation, currently rate controlled.  Continue medical treatment. 7.  COPD, with mild acute exacerbation.  We will continue oxygen therapy and nebulizer treatments as needed. 8.  Diabetes type 2.  Will start low-carb diet and monitor blood sugars before meals and at bedtime.  Will use insulin treatment during the hospital stay. 9.  Acute renal failure, creatinine level is slightly elevated at 1.25.  Will monitor kidney function closely during the diuresis.  Avoid nephrotoxic medications.  All the records are reviewed and case discussed with ED provider. Management plans discussed with the patient, and he is in agreement.  CODE STATUS: Full Code Status History    Date Active Date Inactive Code Status Order ID Comments User Context   03/26/2017 0636 03/27/2017 1729 Full Code [id number]  Arnaldo Natal, MD Inpatient   02/18/2017 2112 02/19/2017 1707 Full Code [id number]  Houston Siren, MD Inpatient   01/05/2017 2246 01/07/2017 1614 Full Code [id number]  Altamese Dilling, MD Inpatient   07/24/2016 0235 07/31/2016 1401 Full Code [id number]  Oralia Manis, MD Inpatient       TOTAL TIME TAKING CARE OF THIS PATIENT: 50 minutes.    Cammy Copa M.D on 01/19/2018 at 10:32 PM  Between 7am to 6pm - Pager - [phone]  After 6pm go to [email or web address] - password [email or web address] [facility]  [place] [facility]  Office  [phone]  CC: Primary care physician; Administration, [employer]

## 2018-01-20 ENCOUNTER — Inpatient Hospital Stay: Payer: No Typology Code available for payment source

## 2018-01-20 ENCOUNTER — Telehealth: Payer: Self-pay | Admitting: Urology

## 2018-01-20 ENCOUNTER — Inpatient Hospital Stay (HOSPITAL_COMMUNITY)
Admit: 2018-01-20 | Discharge: 2018-01-20 | Disposition: A | Discharge status: Home / Self Care | Payer: No Typology Code available for payment source | Attending: Internal Medicine | Admitting: Internal Medicine

## 2018-01-20 DIAGNOSIS — I5023 Acute on chronic systolic (congestive) heart failure: Secondary | ICD-10-CM

## 2018-01-20 DIAGNOSIS — I248 Other forms of acute ischemic heart disease: Secondary | ICD-10-CM

## 2018-01-20 DIAGNOSIS — I340 Nonrheumatic mitral (valve) insufficiency: Secondary | ICD-10-CM

## 2018-01-20 DIAGNOSIS — R310 Gross hematuria: Secondary | ICD-10-CM

## 2018-01-20 DIAGNOSIS — I4892 Unspecified atrial flutter: Secondary | ICD-10-CM

## 2018-01-20 LAB — URINE DRUG SCREEN, QUALITATIVE (ARMC ONLY)
AMPHETAMINES, UR SCREEN: NOT DETECTED
Barbiturates, Ur Screen: NOT DETECTED
Benzodiazepine, Ur Scrn: NOT DETECTED
COCAINE METABOLITE, UR ~~LOC~~: NOT DETECTED
Cannabinoid 50 Ng, Ur ~~LOC~~: NOT DETECTED
MDMA (Ecstasy)Ur Screen: NOT DETECTED
Methadone Scn, Ur: NOT DETECTED
Opiate, Ur Screen: NOT DETECTED
PHENCYCLIDINE (PCP) UR S: NOT DETECTED
Tricyclic, Ur Screen: NOT DETECTED

## 2018-01-20 LAB — ECHOCARDIOGRAM COMPLETE
Height: 72 in
Weight: 3523.2 oz

## 2018-01-20 LAB — HEPATIC FUNCTION PANEL
ALK PHOS: 146 U/L — AB (ref 38–126)
ALT: 23 U/L (ref 0–44)
AST: 27 U/L (ref 15–41)
Albumin: 3.4 g/dL — ABNORMAL LOW (ref 3.5–5.0)
BILIRUBIN INDIRECT: 1.1 mg/dL — AB (ref 0.3–0.9)
Bilirubin, Direct: 0.5 mg/dL — ABNORMAL HIGH (ref 0.0–0.2)
TOTAL PROTEIN: 7.5 g/dL (ref 6.5–8.1)
Total Bilirubin: 1.6 mg/dL — ABNORMAL HIGH (ref 0.3–1.2)

## 2018-01-20 LAB — GLUCOSE, CAPILLARY
GLUCOSE-CAPILLARY: 121 mg/dL — AB (ref 70–99)
GLUCOSE-CAPILLARY: 151 mg/dL — AB (ref 70–99)
Glucose-Capillary: 100 mg/dL — ABNORMAL HIGH (ref 70–99)
Glucose-Capillary: 213 mg/dL — ABNORMAL HIGH (ref 70–99)
Glucose-Capillary: 158 mg/dL — ABNORMAL HIGH (ref 70–99)

## 2018-01-20 LAB — CBC
HCT: 42.6 % (ref 40.0–52.0)
Hemoglobin: 14 g/dL (ref 13.0–18.0)
MCH: 29.4 pg (ref 26.0–34.0)
MCHC: 32.8 g/dL (ref 32.0–36.0)
MCV: 89.7 fL (ref 80.0–100.0)
Platelets: 105 10*3/uL — ABNORMAL LOW (ref 150–440)
RBC: 4.75 MIL/uL (ref 4.40–5.90)
RDW: 18.4 % — AB (ref 11.5–14.5)
WBC: 7.8 10*3/uL (ref 3.8–10.6)

## 2018-01-20 LAB — BASIC METABOLIC PANEL
Anion gap: 9 (ref 5–15)
BUN: 13 mg/dL (ref 8–23)
CHLORIDE: 107 mmol/L (ref 98–111)
CO2: 24 mmol/L (ref 22–32)
Calcium: 8.7 mg/dL — ABNORMAL LOW (ref 8.9–10.3)
Creatinine, Ser: 1.21 mg/dL (ref 0.61–1.24)
GFR calc Af Amer: >60 mL/min (ref >60)
GFR calc non Af Amer: >60 mL/min (ref >60)
Glucose, Bld: 117 mg/dL — ABNORMAL HIGH (ref 70–99)
POTASSIUM: 3.1 mmol/L — AB (ref 3.5–5.1)
Sodium: 140 mmol/L (ref 135–145)

## 2018-01-20 LAB — TROPONIN I
TROPONIN I: 0.07 ng/mL — AB (ref <0.03)
Troponin I: 0.08 ng/mL (ref <0.03)

## 2018-01-20 LAB — LIPASE, BLOOD: Lipase: 19 U/L (ref 11–51)

## 2018-01-20 MED ORDER — LOSARTAN POTASSIUM 25 MG PO TABS: 25.0000 mg | ORAL_TABLET | Freq: Every day | ORAL | Status: DC

## 2018-01-20 MED ORDER — FUROSEMIDE 10 MG/ML IJ SOLN
40.0000 mg | Freq: Two times a day (BID) | INTRAMUSCULAR | Status: DC
  Administered 2018-01-20 – 2018-01-21 (×3): 40 mg via INTRAVENOUS
  Filled 2018-01-20 (×3): qty 4

## 2018-01-20 MED ORDER — OXYCODONE HCL 5 MG PO TABS: 5.0000 mg | ORAL_TABLET | Freq: Four times a day (QID) | ORAL | Status: DC | PRN

## 2018-01-20 MED ORDER — NITROGLYCERIN 0.4 MG SL SUBL: 0.4000 mg | SUBLINGUAL_TABLET | SUBLINGUAL | Status: DC | PRN

## 2018-01-20 MED ORDER — INSULIN ASPART 100 UNIT/ML ~~LOC~~ SOLN
0.0000 [IU] | Freq: Three times a day (TID) | SUBCUTANEOUS | Status: DC
  Administered 2018-01-20: 5 [IU] via SUBCUTANEOUS
  Administered 2018-01-20 – 2018-01-21 (×2): 3 [IU] via SUBCUTANEOUS
  Filled 2018-01-20 (×3): qty 1

## 2018-01-20 MED ORDER — LOSARTAN POTASSIUM 50 MG PO TABS
50.0000 mg | ORAL_TABLET | Freq: Every day | ORAL | Status: DC
  Administered 2018-01-20 – 2018-01-21 (×2): 50 mg via ORAL
  Filled 2018-01-20 (×2): qty 1

## 2018-01-20 MED ORDER — SPIRONOLACTONE 25 MG PO TABS
25.0000 mg | ORAL_TABLET | Freq: Every day | ORAL | Status: DC
  Administered 2018-01-20 – 2018-01-21 (×2): 25 mg via ORAL
  Filled 2018-01-20 (×2): qty 1

## 2018-01-20 MED ORDER — POTASSIUM CHLORIDE CRYS ER 20 MEQ PO TBCR
40.0000 meq | EXTENDED_RELEASE_TABLET | Freq: Every day | ORAL | Status: DC
  Administered 2018-01-20 – 2018-01-21 (×2): 40 meq via ORAL
  Filled 2018-01-20 (×2): qty 2

## 2018-01-20 MED ORDER — ATORVASTATIN CALCIUM 20 MG PO TABS
40.0000 mg | ORAL_TABLET | Freq: Every day | ORAL | Status: DC
  Administered 2018-01-20 – 2018-01-21 (×2): 40 mg via ORAL
  Filled 2018-01-20 (×2): qty 2

## 2018-01-20 MED ORDER — RIVAROXABAN 20 MG PO TABS
20.0000 mg | ORAL_TABLET | Freq: Every day | ORAL | Status: DC
  Administered 2018-01-20: 20 mg via ORAL
  Filled 2018-01-20: qty 1

## 2018-01-20 MED ORDER — CARVEDILOL 25 MG PO TABS
25.0000 mg | ORAL_TABLET | Freq: Two times a day (BID) | ORAL | Status: DC
  Administered 2018-01-20 – 2018-01-21 (×3): 25 mg via ORAL
  Filled 2018-01-20 (×3): qty 1

## 2018-01-20 MED ORDER — IPRATROPIUM-ALBUTEROL 0.5-2.5 (3) MG/3ML IN SOLN: 3.0000 mL | RESPIRATORY_TRACT | Status: DC | PRN

## 2018-01-20 MED ORDER — TORSEMIDE 20 MG PO TABS: 40.0000 mg | ORAL_TABLET | Freq: Every day | ORAL | Status: DC

## 2018-01-20 MED ORDER — FLUTICASONE FUROATE-VILANTEROL 200-25 MCG/INH IN AEPB
1.0000 | INHALATION_SPRAY | Freq: Every day | RESPIRATORY_TRACT | Status: DC
  Administered 2018-01-20: 1 via RESPIRATORY_TRACT
  Filled 2018-01-20: qty 28

## 2018-01-20 NOTE — Progress Notes (Signed)
*  PRELIMINARY RESULTS* Echocardiogram 2D Echocardiogram has been performed.  Joanette GulaJoan M Rashi Giuliani 01/20/2018, 1:51 PM

## 2018-01-20 NOTE — Care Management (Signed)
[facility] and there is confirmation of [facility].  [facility] is Dr Ermalinda MemosBradshaw.  He has a cardiologist but can not recall his name.  Says he last saw Dr Ermalinda MemosBradshaw one month ago.  He receives his medications through [facility]. On occasion, he may have to go to a local drug store in [place]  and has to pa for  a medication out of pocket.  In order to receive meds without cost they must.  He has not kept three of his scheduled appointments with the [facility]. Has scales and says he weighs daily and is compliant with med administration. Chronic Xarelto. Room air

## 2018-01-20 NOTE — Progress Notes (Signed)
Patient ID: [name], male   DOB: 04/03/1956, 62 y.o.   MRN: [id number]  [facility] [employer] PROGRESS NOTE  Kendrew Paci NGE:[id number] DOB: 09/01/1955 DOA: 01/19/2018 PCP: Administration, Veterans  HPI/Subjective: Patient still short of breath and coughing up clear phlegm.  Had some blood in the urine.  Positive for urine infection.  States he does not take his Lasix in the afternoon a couple times a week.  Objective: Vitals:   01/20/18 0826 01/20/18 1526  BP: (!) 167/115 129/72  Pulse: 84 76  Resp: 18 18  Temp: (!) 101.8 F (38.8 C) 98.6 F (37 C)  SpO2: 98% 98%    Filed Weights   01/19/18 1438 01/20/18 0509  Weight: 99.8 kg (220 lb) 99.9 kg (220 lb 3.2 oz)    ROS: Review of Systems  Constitutional: Negative for chills and fever.  Eyes: Negative for blurred vision.  Respiratory: Positive for cough and shortness of breath.   Cardiovascular: Negative for chest pain.  Gastrointestinal: Negative for abdominal pain, constipation, diarrhea, nausea and vomiting.  Genitourinary: Negative for dysuria.  Musculoskeletal: Negative for joint pain.  Neurological: Negative for dizziness and headaches.   Exam: Physical Exam  Constitutional: He is oriented to person, place, and time.  HENT:  Nose: No mucosal edema.  Mouth/Throat: No oropharyngeal exudate or posterior oropharyngeal edema.  Eyes: Pupils are equal, round, and reactive to light. Conjunctivae, EOM and lids are normal.  Neck: No JVD present. Carotid bruit is not present. No edema present. No thyroid mass and no thyromegaly present.  Cardiovascular: S1 normal and S2 normal. An irregularly irregular rhythm present. Exam reveals no gallop.  No murmur heard. Pulses:      Dorsalis pedis pulses are 2+ on the right side, and 2+ on the left side.  Respiratory: No respiratory distress. He has decreased breath sounds in the right lower field and the left lower field. He has no wheezes. He has no rhonchi. He has rales in the right  lower field and the left lower field.  GI: Soft. Bowel sounds are normal. There is no tenderness.  Musculoskeletal:       Right ankle: He exhibits no swelling.       Left ankle: He exhibits no swelling.  Lymphadenopathy:    He has no cervical adenopathy.  Neurological: He is alert and oriented to person, place, and time. No cranial nerve deficit.  Skin: Skin is warm. No rash noted. Nails show no clubbing.  Psychiatric: He has a normal mood and affect.      Data Reviewed: Basic Metabolic Panel: Recent Labs  Lab 01/19/18 1439 01/20/18 0541  NA 143 140  K 4.0 3.1*  CL 110 107  CO2 27 24  GLUCOSE 124* 117*  BUN 14 13  CREATININE 1.25* 1.21  CALCIUM 9.5 8.7*   Liver Function Tests: Recent Labs  Lab 01/20/18 0541  AST 27  ALT 23  ALKPHOS 146*  BILITOT 1.6*  PROT 7.5  ALBUMIN 3.4*   Recent Labs  Lab 01/20/18 0541  LIPASE 19   CBC: Recent Labs  Lab 01/19/18 1439 01/20/18 0541  WBC 7.7 7.8  HGB 15.0 14.0  HCT 45.6 42.6  MCV 90.1 89.7  PLT 124* 105*   Cardiac Enzymes: Recent Labs  Lab 01/19/18 1439 01/19/18 1729 01/20/18 1147  TROPONINI 0.06* 0.07* 0.08*   BNP (last 3 results) Recent Labs    08/07/17 0831 08/08/17 2139 01/19/18 1729  BNP 906.0* 289.0* 935.0*     CBG: Recent Labs  Lab 01/20/18 0041 01/20/18 0822 01/20/18 1143  GLUCAP 121* 100* 151*    Recent Results (from the past 240 hour(s))  Urine culture     Status: Abnormal (Preliminary result)   Collection Time: 01/19/18  5:28 PM  Result Value Ref Range Status   Specimen Description   Final    URINE, CLEAN CATCH Performed at [facility], [address] [place], [place] [address]    Special Requests   Final    NONE Performed at [facility], [address], [place], [place] [address]    Culture (A)  Final    >=100,000 COLONIES/mL ESCHERICHIA COLI SUSCEPTIBILITIES TO FOLLOW Performed at [facility], [address], [place], [place] [address]     Report Status PENDING  Incomplete     Studies: Dg Chest 2 View  Result Date: 01/19/2018 CLINICAL DATA:  Chest pain began today, dull sensation in the mid sternum EXAM: CHEST - 2 VIEW COMPARISON:  Chest x-ray of 08/08/2016 FINDINGS: There is some indistinctness of perihilar vasculature and there may be a small amount of fluid in the fissures on the lateral view suggesting a mild degree of pulmonary vascular congestion. No frank edema is seen and no pleural effusion is noted. Mild cardiomegaly is stable. No acute bony abnormality is seen. IMPRESSION: Probable mild pulmonary vascular congestion.  Cardiomegaly Electronically Signed   By: Dwyane Dee M.D.   On: 01/19/2018 15:37   US Renal  Result Date: 01/20/2018 CLINICAL DATA:  Hematuria for 2 days EXAM: RENAL / URINARY TRACT ULTRASOUND COMPLETE COMPARISON:  CT abdomen and pelvis January 15, 2012 FINDINGS: Right Kidney: Length: 12.4 cm. Echogenicity and renal echogenicity are within normal limits. No mass, perinephric fluid, or hydronephrosis visualized. No sonographically demonstrable calculus or ureterectasis. There is a prominent column of Bertin in the mid kidney on the right, an anatomic variant. Left Kidney: Length: 11.3 cm. Echogenicity and renal echogenicity are within normal limits. No mass, perinephric fluid, or hydronephrosis visualized. There is a prominent column of Bertin in the mid kidney on the left, an anatomic variant. No sonographically demonstrable calculus or ureterectasis. Bladder: Mild urinary bladder wall thickening noted. No mass seen. Postvoid residual within the urinary bladder measures 141 mL. IMPRESSION: 1. Postvoid residual in the urinary bladder measures 141 mL. Mild urinary bladder wall thickening raises concern for a degree of cystitis. 2. No renal lesions are evident. There is a column of Bertin in each mid kidney, an anatomic variant. Electronically Signed   By: Bretta Bang III M.D.   On: 01/20/2018 09:56    Scheduled  Meds: . atorvastatin  40 mg Oral Daily  . carvedilol  25 mg Oral BID WC  . docusate sodium  100 mg Oral BID  . fluticasone furoate-vilanterol  1 puff Inhalation Daily  . furosemide  40 mg Intravenous BID  . insulin aspart  0-15 Units Subcutaneous TID WC  . insulin aspart  0-5 Units Subcutaneous QHS  . losartan  50 mg Oral Daily  . potassium chloride  40 mEq Oral Daily  . rivaroxaban  20 mg Oral Q supper  . spironolactone  25 mg Oral Daily  . traZODone  50 mg Oral QHS   Continuous Infusions: . cefTRIAXone (ROCEPHIN)  IV      Assessment/Plan:  1. Acute respiratory distress has resolved.  Breathing on room air. 2. Acute on chronic systolic congestive heart failure.  Last ejection fraction 01/2017 showed an EF of 25 to 30%.  As per  cardiology note he is 12 pounds overweight.  Continue aggressive diuresis with IV Lasix.  Patient also on Coreg, losartan and Spironolactone 3. Atrial flutter on Coreg for rate control and Xarelto for anticoagulation 4. Acute cystitis with E. coli and hematuria.  On IV Rocephin.  Follow-up urine culture results and sensitivities. 5. History of COPD.  No signs of acute exacerbation continue nebulizers and inhalers. 6. Type 2 diabetes mellitus on sliding scale. 7. Hyperlipidemia unspecified on atorvastatin. 8. Chronic kidney disease stage II.  Continue to monitor with diuresis.  Code Status:     Code Status Orders  (From admission, onward)        Start     Ordered   01/19/18 2358  Full code  Continuous     01/19/18 2357    Code Status History    Date Active Date Inactive Code Status Order ID Comments User Context   03/26/2017 0636 03/27/2017 1729 Full Code [id number]  Arnaldo Nataliamond, Michael S, MD Inpatient   02/18/2017 2112 02/19/2017 1707 Full Code [id number]  Houston SirenSainani, Vivek J, MD Inpatient   01/05/2017 2246 01/07/2017 1614 Full Code [id number]  Altamese DillingVachhani, Vaibhavkumar, MD Inpatient   07/24/2016 0235 07/31/2016 1401 Full Code [id number]  Oralia ManisWillis, David, MD Inpatient     Advance Directive Documentation     Most Recent Value  Type of Advance Directive  Healthcare Power of Attorney, Living will  Pre-existing out of facility DNR order (yellow form or pink MOST form)  -  "MOST" Form in Place?  -      Disposition Plan: Cardiology states that the patient is 12 pounds overweight so I would like to get some of his weight down prior to disposition.  Patient also had a fever and would like to be afebrile prior to disposition.  Consultants:  Cardiology  Antibiotics:   Rocephin  Time spent: 28 minutes  Najir Roop [employer]  [employer]

## 2018-01-20 NOTE — Consult Note (Signed)
Cardiology Consultation:   Patient ID: [name]; [id number]; 11-Feb-1956   Admit date: 01/19/2018 Date of Consult: 01/20/2018  Primary Care Provider: [facility] Primary Cardiologist: [place]   Patient Profile:   [name] is a 63 y.o. 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 Duane Boston, MD.  History of Present Illness:   [name] most recently underwent cardiac cath in 06/2016 for acute on chronic combined CHF that showed patent stents in the RCA and LCx with no significant restenosis, proximal LAD 40% stenosis, mid LAD 40% stenosis, distal LAD 30% stenosis, mid RCA 10% stenosis.  Right heart catheterization showed severely elevated filling pressures and severe pulmonary hypertension.  RV pressure was 82/18, PDA 81/43 with a mean of 57.  RA pressure was 11.  LVEDP was 34 mmHg.  Pulmonary capillary wedge pressure could not be obtained due to inability to advance the Gordy Councilman catheter.  Cardiac output was 4.44 with a cardiac index of 1.94.  He was admitted to [facility] in 01/2017 with worsening dyspnea and was noted to be an A. fib with RVR.  He was started on Xarelto 20 mg daily for anticoagulation and rate controlled with carvedilol 25 mg twice daily.  He was readmitted to [facility] in 02/2017 with chest pain and productive cough.  He was noted to be in atrial flutter with RVR.  He was compliant with his Xarelto.  Troponin was mildly elevated with a peak of 0.07.  Echocardiogram on 02/19/2017 showed an EF of 25 to 30%, diffuse hypokinesis, unable to exclude wall motion abnormalities, not technically sufficient to allow for LV diastolic function, moderate mitral regurgitation, mildly to  moderately dilated left atrium, moderately reduced RVSF, PASP 36 mmHg. He underwent successful DCCV at that time and was continued on Coreg and Xarelto.  Discharge weight noted to be 208 pounds.  He has since followed up with his cardiologist through the [place].  These records are not available for review.  Patient was in his usual state of health until 7/22 while he was in [place], [place] with his wife while she had a medical appointment.  At approximately 2 AM on 7/22 the patient began to develop worsening shortness of breath with associated chest tightness.  He noted his weight had been slowly trending up though cannot provide specific details of weight trend.  He reported compliance with his medications though there has been some dietary indiscretion.  He has noted increased lower extremity swelling, abdominal distention, and orthopnea.  Later in the day on 7/22 he developed frank hematuria as well as ecchymosis along the right flank with no known trauma.  Because of his shortness of breath his wife suggested that they drive back to the [place] area for the patient to be evaluated at [facility].  Upon the patient's arrival to [facility] he was noted to have an initial blood pressure of 184/100 with BP trending into the 220s over 120s.  He was noted to be in atrial flutter with RVR with variable AV block with heart rates into the 120s beats per minute.  Oxygen saturations were 96 to 98% on room air.  Initial weight noted to  be 220 pounds.  Initially patient was afebrile.  Labs showed a BNP 935, troponin 0 0.06 trending to 0.07, WBC 7.7, hemoglobin 15.0, Pletal count 124 trending to 105, potassium 4.0 trending to 3.1, serum creatinine 1.25 trending to 1.21.  UA showed greater than 500 mg/dL of glucose, moderate hemoglobin, positive nitrates, trace leukocytes, greater than 50 RBCs per high-power field, greater than 50 WBC per high-power field.  Patient's blood pressure has been slowly improving  into the 024O systolic currently.  With IV diuresis he has a documented urine output of 1.5 L for the admission.  Currently, weight documented to be 220 pounds.  He indicates his shortness of breath is improved.  He remains an atrial flutter with variable AV block with ventricular rates well controlled into the 80s beats per minute.  Echocardiogram is pending.  Of note, late in the morning on 7/24 the patient developed a fever of 101.8.  He is currently without chest pain.  He is eating a breakfast high and sodium.   Past Medical History:  Diagnosis Date  . Atrial fibrillation ([place])   . CAD in native artery    a. stress echo 12/2007 abnl, EF > 55%, b. LHC 01/28/08: mLAD 30, D1 40, dLCx 38, pRCA 30, mRCA 70, mRCA lesion 2 80, PDA 90, s/p PCI/BMS to prox and distal RCA, s/p PCI/BMS to PDA; c. patient reports PCI/stenting x 2 in early 2017 at the [place] (no records on file) d. 06/2016: cath showing patent stents along RCA and LCx with moderate 40% stenosis along the LAD.   [name] Chronic combined systolic (congestive) and diastolic (congestive) heart failure ([place])    a. echo 2009: > 55% b. 06/2016: EF 25-30% by echo in 06/2016, cath showing patent stents with moderate disease along LAD  . COPD (chronic obstructive pulmonary disease) ([place])   . Diabetes mellitus without complication ([place])   . Gout   . Hyperlipidemia   . Hypertension   . Hypertensive heart disease   . Obesity   . Tobacco abuse     Past Surgical History:  Procedure Laterality Date  . CARDIAC CATHETERIZATION  2009   [facility];   . CARDIAC CATHETERIZATION  2010  . CARDIAC CATHETERIZATION N/A 07/28/2016   Procedure: Right and Left Heart Cath and possible PCI;  Surgeon: Wellington Hampshire, MD;  Location: [facility];  Service: Cardiovascular;  Laterality: N/A;  . CARDIOVERSION N/A 03/27/2017   Procedure: CARDIOVERSION;  Surgeon: Wellington Hampshire, MD;  Location: [facility] ORS;  Service: Cardiovascular;  Laterality: N/A;  . CORONARY ANGIOPLASTY   2009   s/p stent placement at [facility].     Home Meds: Prior to Admission medications   Medication Sig Start Date End Date Taking? Authorizing Provider  albuterol-ipratropium (COMBIVENT) 18-103 MCG/ACT inhaler Inhale 2 puffs into the lungs every 4 (four) hours as needed for wheezing.   Yes [provider]  atorvastatin (LIPITOR) 40 MG tablet Take 40 mg by mouth daily.    Yes [provider]  budesonide-formoterol (SYMBICORT) 160-4.5 MCG/ACT inhaler Inhale 2 puffs into the lungs 2 (two) times daily.   Yes [provider]  carvedilol (COREG) 25 MG tablet Take 1 tablet (25 mg total) by mouth 2 (two) times daily with a meal. 07/31/16  Yes Fritzi Mandes, MD  docusate sodium (COLACE) 100 MG capsule Take 100 mg by mouth 2 (two) times daily.   Yes [provider]  losartan (COZAAR) 25 MG tablet Take 1 tablet (25 mg total) by  mouth daily. 01/08/17  Yes Fritzi Mandes, MD  metFORMIN (GLUCOPHAGE) 1000 MG tablet Take 1,000 mg by mouth 2 (two) times daily with a meal.    Yes [provider]  methocarbamol (ROBAXIN) 500 MG tablet Take 1 tablet (500 mg total) by mouth 4 (four) times daily. 12/17/17  Yes Johnn Hai, PA-C  nitroGLYCERIN (NITROSTAT) 0.4 MG SL tablet Place 0.4 mg under the tongue every 5 (five) minutes as needed for chest pain.   Yes [provider]  oxyCODONE (OXY IR/ROXICODONE) 5 MG immediate release tablet Take 1 tablet (5 mg total) by mouth every 6 (six) hours as needed for severe pain. 12/17/17  Yes Johnn Hai, PA-C  rivaroxaban (XARELTO) 20 MG TABS tablet Take 1 tablet (20 mg total) by mouth daily with supper. 02/19/17  Yes Bettey Costa, MD  spironolactone (ALDACTONE) 25 MG tablet Take 1 tablet (25 mg total) by mouth daily. 08/01/16  Yes Fritzi Mandes, MD  torsemide (DEMADEX) 20 MG tablet Take 2 tablets (40 mg total) by mouth daily. 01/07/17  Yes Fritzi Mandes, MD  traZODone (DESYREL) 50 MG tablet Take 50 mg by mouth at bedtime.   Yes [provider]  guaiFENesin-dextromethorphan (ROBITUSSIN DM) 100-10 MG/5ML syrup Take 5 mLs by mouth every 4 (four) hours as needed for cough. Patient not taking: Reported on 08/07/2017 03/27/17   Demetrios Loll, MD  oxyCODONE-acetaminophen (PERCOCET) 5-325 MG tablet Take 1-2 tablets by mouth every 8 (eight) hours as needed. Patient not taking: Reported on 08/07/2017 06/18/17   Earleen Newport, MD  predniSONE (DELTASONE) 20 MG tablet 3 tablets daily x 4 days Patient not taking: Reported on 01/19/2018 08/09/17   Paulette Blanch, MD    Inpatient Medications: Scheduled Meds: . atorvastatin  40 mg Oral Daily  . carvedilol  25 mg Oral BID WC  . docusate sodium  100 mg Oral BID  . fluticasone furoate-vilanterol  1 puff Inhalation Daily  . furosemide  40 mg Intravenous BID  . insulin aspart  0-15 Units Subcutaneous TID WC  . insulin aspart  0-5 Units Subcutaneous QHS  . losartan  50 mg Oral Daily  . rivaroxaban  20 mg Oral Q supper  . spironolactone  25 mg Oral Daily  . traZODone  50 mg Oral QHS   Continuous Infusions: . cefTRIAXone (ROCEPHIN)  IV     PRN Meds: bisacodyl, HYDROcodone-acetaminophen, ipratropium-albuterol, nitroGLYCERIN, ondansetron **OR** ondansetron (ZOFRAN) IV, oxyCODONE  Allergies:   Allergies  Allergen Reactions  . Aspirin Other (See Comments)    Told not to take because of blood thinner  . Ibuprofen Nausea And Vomiting  . Tylenol [Acetaminophen] Nausea And Vomiting    Social History:   Social History   Socioeconomic History  . Marital status: Married    Spouse name: Not on file  . Number of children: Not on file  . Years of education: Not on file  . Highest education level: Not on file  Occupational History  . Not on file  Social Needs  . Financial resource strain: Not on file  . Food insecurity:    Worry: Not on file    Inability: Not on file  . Transportation needs:    Medical: Not on file    Non-medical: Not on file  Tobacco Use  . Smoking status:  Former Smoker    Packs/day: 1.00    Years: 35.00    Pack years: 35.00    Types: Cigarettes  . Smokeless tobacco: Never Used  Substance  and Sexual Activity  . Alcohol use: No    Comment: Quit 8 yrs.  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 Drug use: No  . Sexual activity: Not on file  Lifestyle  . Physical activity:    Days per week: Not on file    Minutes per session: Not on file  . Stress: Not on file  Relationships  . Social connections:    Talks on phone: Not on file    Gets together: Not on file    Attends religious service: Not on file    Active member of club or organization: Not on file    Attends meetings of clubs or organizations: Not on file    Relationship status: Not on file  . Intimate partner violence:    Fear of current or ex partner: Not on file    Emotionally abused: Not on file    Physically abused: Not on file    Forced sexual activity: Not on file  Other Topics Concern  . Not on file  Social History Narrative  . Not on file     Family History:   Family History  Problem Relation Age of Onset  . Heart attack Mother 38    ROS:  Review of Systems  Constitutional: Positive for malaise/fatigue. Negative for chills, diaphoresis, fever and weight loss.       Weight gain  HENT: Negative for congestion.   Eyes: Negative for discharge and redness.  Respiratory: Positive for cough and shortness of breath. Negative for hemoptysis, sputum production and wheezing.   Cardiovascular: Positive for orthopnea and leg swelling. Negative for chest pain, palpitations, claudication and PND.  Gastrointestinal: Negative for abdominal pain, blood in stool, heartburn, melena, nausea and vomiting.  Genitourinary: Positive for hematuria.  Musculoskeletal: Negative for falls and myalgias.  Skin: Negative for rash.  Neurological: Positive for weakness. Negative for dizziness, tingling, tremors, sensory change, speech change, focal weakness and loss of consciousness.  Endo/Heme/Allergies:  Bruises/bleeds easily.  Psychiatric/Behavioral: Negative for substance abuse. The patient is not nervous/anxious.   All other systems reviewed and are negative.     Physical Exam/Data:   Vitals:   01/20/18 0430 01/20/18 0509 01/20/18 0511 01/20/18 0826  BP: 138/67  (!) 159/80 (!) 167/115  Pulse: 85  82 84  Resp: (!) 21   18  Temp:   99.6 F (37.6 C) (!) 101.8 F (38.8 C)  TempSrc:   Oral Oral  SpO2: 96%  97% 98%  Weight:  220 lb 3.2 oz (99.9 kg)    Height:  6' (1.829 m)      Intake/Output Summary (Last 24 hours) at 01/20/2018 1115 Last data filed at 01/20/2018 1041 Gross per 24 hour  Intake 340 ml  Output 1850 ml  Net -1510 ml   Filed Weights   01/19/18 1438 01/20/18 0509  Weight: 220 lb (99.8 kg) 220 lb 3.2 oz (99.9 kg)   Body mass index is 29.86 kg/m.   Physical Exam: General: Well developed, well nourished, in no acute distress. Head: Normocephalic, atraumatic, sclera non-icteric, no xanthomas, nares without discharge.  Neck: Negative for carotid bruits. JVD elevated ~ 8 cm. Lungs: Mildly diminished breath sounds bilaterally. Breathing is unlabored. Heart: Irregular with S1 S2. II/VI systolic murmur along the apex, no rubs, or gallops appreciated. Abdomen: Soft, non-tender, mildly distended with normoactive bowel sounds. No hepatomegaly. No rebound/guarding. No obvious abdominal masses. Msk:  Strength and tone appear normal for age. Extremities: No clubbing or cyanosis. Trace bilateral pretibial edema.  Distal pedal pulses are 2+ and equal bilaterally. Neuro: Alert and oriented X 3. No facial asymmetry. No focal deficit. Moves all extremities spontaneously. Psych:  Responds to questions appropriately with a normal affect.   EKG:  The EKG was personally reviewed and demonstrates: atrial flutter, 86 bpm, left axis deviation, nonspecific inferior and anterior st/t changes, TWI along lateral leads Telemetry:  Telemetry was personally reviewed and demonstrates: atrial  flutter with variable AV block, heart rates improved from the 120s to 80s bpm, occasional PVCs, 4 beats of NSVT  Weights: [facility] Weights   01/19/18 1438 01/20/18 0509  Weight: 220 lb (99.8 kg) 220 lb 3.2 oz (99.9 kg)    Relevant CV Studies: Echo this admission pending   Echo 02/19/2017: Study Conclusions  - Left ventricle: The cavity size was normal. There was moderate   concentric hypertrophy. Systolic function was severely reduced.   The estimated ejection fraction was in the range of 25% to 30%.   Diffuse hypokinesis. Regional wall motion abnormalities cannot be   excluded. The study is not technically sufficient to allow   evaluation of LV diastolic function. - Mitral valve: There was mild to moderate regurgitation. - Left atrium: The atrium was mildly to moderately dilated. - Right ventricle: Systolic function was moderately reduced. - Pulmonary arteries: PA peak pressure: 36 mm Hg (S).  Impressions:  - Rhythm is atrial flutter.   LHC 07/28/2016: Conclusion     Prox RCA lesion, 0 %stenosed.  Mid RCA lesion, 10 %stenosed.  Mid Cx lesion, 0 %stenosed.  Mid Cx to Dist Cx lesion, 0 %stenosed.  Prox Cx lesion, 40 %stenosed.  Prox LAD lesion, 40 %stenosed.  Mid LAD lesion, 40 %stenosed.  Dist LAD lesion, 30 %stenosed.  LV end diastolic pressure is severely elevated.   1. Patent stents in the right coronary artery and left circumflex with no significant restenosis. Moderate LAD disease.  2. Right heart catheterization showed severely elevated filling pressures and severe pulmonary hypertension. RV pressure was 82/18, PDA 81/43 with a mean of 57. RA pressure was 11. Left ventricular end-diastolic pressure was 34 mmHg. Pulmonary capillary wedge pressure could not be obtained due to inability to advance the Swan-Ganz catheter. Cardiac output is 4.44 with a cardiac index of 1.94.  Recommendations: Continue medical therapy for coronary artery disease and systolic  heart failure. The patient continues to be severely volume overloaded and will need another few days of IV diuresis.     Laboratory Data:  Chemistry Recent Labs  Lab 01/19/18 1439 01/20/18 0541  NA 143 140  K 4.0 3.1*  CL 110 107  CO2 27 24  GLUCOSE 124* 117*  BUN 14 13  CREATININE 1.25* 1.21  CALCIUM 9.5 8.7*  GFRNONAA >60 >60  GFRAA >60 >60  ANIONGAP 6 9    Recent Labs  Lab 01/20/18 0541  PROT 7.5  ALBUMIN 3.4*  AST 27  ALT 23  ALKPHOS 146*  BILITOT 1.6*   Hematology Recent Labs  Lab 01/19/18 1439 01/20/18 0541  WBC 7.7 7.8  RBC 5.06 4.75  HGB 15.0 14.0  HCT 45.6 42.6  MCV 90.1 89.7  MCH 29.7 29.4  MCHC 33.0 32.8  RDW 18.5* 18.4*  PLT 124* 105*   Cardiac Enzymes Recent Labs  Lab 01/19/18 1439 01/19/18 1729  TROPONINI 0.06* 0.07*   No results for input(s): TROPIPOC in the last 168 hours.  BNP Recent Labs  Lab 01/19/18 1729  BNP 935.0*    DDimer No results for input(s): DDIMER  in the last 168 hours.  Radiology/Studies:  Dg Chest 2 View  Result Date: 01/19/2018 IMPRESSION: Probable mild pulmonary vascular congestion.  Cardiomegaly Electronically Signed   By: Ivar Drape M.D.   On: 01/19/2018 15:37   US Renal  Result Date: 01/20/2018 IMPRESSION: 1. Postvoid residual in the urinary bladder measures 141 mL. Mild urinary bladder wall thickening raises concern for a degree of cystitis. 2. No renal lesions are evident. There is a column of Bertin in each mid kidney, an anatomic variant. Electronically Signed   By: Lowella Grip III M.D.   On: 01/20/2018 09:56    Assessment and Plan:   1. Typical atrial flutter with RVR: -Remains in atrial flutter with variable AV block with well controlled ventricular response -Continue Coreg for rate control -Continue Xarelto (he denies missing any doses) given his CHADS2VASc of at least 4 (CHF, HTN, DM, vascular disease) as long as his blood work allows -Will likely need DCCV once his acute illness is  improved (now with a fever) and his adequately diuresed and BP is controlled -Consider EP evaluation as an outpatient for atrial flutter ablation   2. Acute on chronic combined systolic and diastolic CHF due to ICM/pulmonary hypertension: -He remains volume overloaded -Likely in the setting of his atrial flutter with RVR and poorly controlled hypertension -Cannot exclude dietary noncompliance as well as his breakfast this morning is high in sodium -Echo pending -Continue IV diuresis with Lasix 40 mg bid with KCl repletion -Dry weight ~ 208 pounds, currently up about 12 pounds -Continue Coreg, losartan, and spironolactone   3. Elevated troponin/CAD: -Mildly elevated and flat trending with a peak of 0.07 currently -Continue to cycle until peak or plateau  -Likely supply demand ischemia in the setting of known CAD as above with volume overload, atrial flutter with RVR, and poorly controlled hypertension -Echo pending as above -No indication for heparin infusion at this time -On Xarelto in place of ASA  4. Hematuria: -Urology has been consulted by IM -HGB stable -Remains on Xarelto   5. Flank bruise: -Patient does not recall traumatic event -Check urine drug screen -Consider GGT -Remains on Xarelto at this time -HGB stable -Lipase normal -AST/ALT norma; -Alk phos 146  6. HTN: -Blood pressure poorly controlled upon admission in the 220s/120s -Improving -Continue carvedilol, losartan, spironolactone and IV Lasix as above  7. HLD: -Goal LDL < 70 -Last LDL of 64 from 12/2017 -Check FLP  -Lipitor 40 mg daily  8. COPD: -Per IM  9. Thrombocytopenia: -May need to hold Xarelto if continues to trend downwards  10. Fever/likely UTI: -Per IM -May need cultures, defer to IM   For questions or updates, please contact [place] Please consult [email or web address] for contact info under Cardiology/STEMI.   Signed, Christell Faith, PA-C [facility] Pager: [phone] 01/20/2018, 11:15 AM

## 2018-01-20 NOTE — Plan of Care (Signed)

## 2018-01-20 NOTE — Progress Notes (Signed)
Nutrition Brief Note  Patient identified on the Malnutrition Screening Tool (MST) Report.  62 year old male who presented to the ED with chest pain and hematuria. PMH significant for COPD, CHF, CAD, type 2 diabetes mellitus, hyperlipidemia, hypertension, and atrial fibrillation on Eliquis.  Spoke with pt at bedside who endorses intentional weight loss but states "I always put it back." Pt reports his UBW as 215 lbs. Pt denies unintentional weight loss. Pt states that he has a "good appetite." Noted approximately 100% completed breakfast meal tray in room at time of visit.  Wt Readings from Last 15 Encounters:  01/20/18 220 lb 3.2 oz (99.9 kg)  12/17/17 220 lb (99.8 kg)  08/08/17 226 lb (102.5 kg)  08/07/17 238 lb (108 kg)  06/18/17 220 lb (99.8 kg)  03/27/17 208 lb (94.3 kg)  02/18/17 211 lb (95.7 kg)  01/07/17 218 lb 3.2 oz (99 kg)  07/31/16 234 lb 6.4 oz (106.3 kg)    Body mass index is 29.86 kg/m. Patient meets criteria for "Overweight" based on current BMI.   Current diet order is Heart Healthy/Carb Modified, patient is consuming approximately 95% of meals at this time. Labs and medications reviewed.   No nutrition interventions warranted at this time. If nutrition issues arise, please consult RD.   Earma ReadingKate Jablonski Dafna Romo, MS, RD, LDN Pager: [phone] Weekend/After Hours: [phone]

## 2018-01-20 NOTE — Telephone Encounter (Signed)
Mr. [name] was admitted with a urinary tract infection on Xarelto and had episode of gross hematuria yesterday.  His hematuria has since resolved.  Case was discussed with Dr. Hilton SinclairWeiting.  We will defer inpatient urologic consultation given his resolution of gross hematuria at this time.  I have recommended nonurgent outpatient follow-up to reassess his urine and assess whether or not he needs a more extensive hematuria evaluation.  Vanna ScotlandAshley Shlome Baldree, MD

## 2018-01-20 NOTE — ED Notes (Signed)
Patient is resting comfortably. 

## 2018-01-20 NOTE — ED Notes (Signed)
Pt given diet soda and sandwich tray per admitting Dr request

## 2018-01-21 ENCOUNTER — Telehealth: Payer: Self-pay | Admitting: Cardiovascular Disease

## 2018-01-21 LAB — BASIC METABOLIC PANEL
ANION GAP: 8 (ref 5–15)
BUN: 16 mg/dL (ref 8–23)
CALCIUM: 8.6 mg/dL — AB (ref 8.9–10.3)
CO2: 27 mmol/L (ref 22–32)
Chloride: 106 mmol/L (ref 98–111)
Creatinine, Ser: 1.18 mg/dL (ref 0.61–1.24)
GFR calc Af Amer: >60 mL/min (ref >60)
Glucose, Bld: 190 mg/dL — ABNORMAL HIGH (ref 70–99)
POTASSIUM: 3.1 mmol/L — AB (ref 3.5–5.1)
Sodium: 141 mmol/L (ref 135–145)

## 2018-01-21 LAB — URINE CULTURE: Culture: >=100000 — AB

## 2018-01-21 LAB — CBC
HEMATOCRIT: 42 % (ref 40.0–52.0)
HEMOGLOBIN: 13.8 g/dL (ref 13.0–18.0)
MCH: 29.6 pg (ref 26.0–34.0)
MCHC: 32.8 g/dL (ref 32.0–36.0)
MCV: 90 fL (ref 80.0–100.0)
Platelets: 104 10*3/uL — ABNORMAL LOW (ref 150–440)
RBC: 4.66 MIL/uL (ref 4.40–5.90)
RDW: 18.3 % — AB (ref 11.5–14.5)
WBC: 7.5 10*3/uL (ref 3.8–10.6)

## 2018-01-21 LAB — LIPID PANEL
CHOLESTEROL: 154 mg/dL (ref 0–200)
HDL: 40 mg/dL — ABNORMAL LOW (ref >40)
LDL CALC: 96 mg/dL (ref 0–99)
Total CHOL/HDL Ratio: 3.9 RATIO
Triglycerides: 92 mg/dL (ref <150)
VLDL: 18 mg/dL (ref 0–40)

## 2018-01-21 LAB — GLUCOSE, CAPILLARY: Glucose-Capillary: 159 mg/dL — ABNORMAL HIGH (ref 70–99)

## 2018-01-21 LAB — HIV ANTIBODY (ROUTINE TESTING W REFLEX): HIV Screen 4th Generation wRfx: NONREACTIVE

## 2018-01-21 LAB — MAGNESIUM: Magnesium: 1.9 mg/dL (ref 1.7–2.4)

## 2018-01-21 LAB — HEMOGLOBIN: HEMOGLOBIN: 13.7 g/dL (ref 13.0–18.0)

## 2018-01-21 LAB — TROPONIN I: TROPONIN I: 0.07 ng/mL — AB (ref <0.03)

## 2018-01-21 MED ORDER — LOSARTAN POTASSIUM 25 MG PO TABS: 50.0000 mg | ORAL_TABLET | Freq: Every day | ORAL | 0 refills | Status: DC

## 2018-01-21 MED ORDER — CEPHALEXIN 500 MG PO CAPS
500.0000 mg | ORAL_CAPSULE | Freq: Two times a day (BID) | ORAL | Status: DC
Start: 2018-01-21 — End: 2018-01-21

## 2018-01-21 MED ORDER — CEPHALEXIN 500 MG PO CAPS: 500.0000 mg | ORAL_CAPSULE | Freq: Two times a day (BID) | ORAL | Status: DC

## 2018-01-21 MED ORDER — POTASSIUM CHLORIDE CRYS ER 20 MEQ PO TBCR: 40.0000 meq | EXTENDED_RELEASE_TABLET | Freq: Once | ORAL | Status: DC

## 2018-01-21 MED ORDER — TORSEMIDE 20 MG PO TABS: 40.0000 mg | ORAL_TABLET | Freq: Two times a day (BID) | ORAL | 0 refills | Status: DC

## 2018-01-21 MED ORDER — POTASSIUM CHLORIDE CRYS ER 20 MEQ PO TBCR: 20.0000 meq | EXTENDED_RELEASE_TABLET | Freq: Every day | ORAL | 0 refills | Status: DC

## 2018-01-21 MED ORDER — CEPHALEXIN 500 MG PO CAPS: 500.0000 mg | ORAL_CAPSULE | Freq: Two times a day (BID) | ORAL | 0 refills | Status: DC

## 2018-01-21 NOTE — Telephone Encounter (Signed)
App made and mailed to patient ° °[name] °

## 2018-01-21 NOTE — Telephone Encounter (Signed)
TCM....  Patient is being discharged later today   They saw [name]   They are scheduled to see Dr Mariah MillingGollan on 01/26/18   They were seen for CHF  They need to be seen within Within a week   Pt was not added to the wait list   Please call

## 2018-01-21 NOTE — Discharge Instructions (Signed)
Heart healthy and ADA diet. °

## 2018-01-21 NOTE — Discharge Summary (Addendum)
[facility] - [place] [facility]   PATIENT NAME: [name]    MR#:  [id number]  DATE OF BIRTH:  [birth year]  DATE OF ADMISSION:  01/19/2018   ADMITTING PHYSICIAN: Cammy CopaAngela Maier, MD  DATE OF DISCHARGE: 01/21/2018 10:30 AM  PRIMARY CARE PHYSICIAN: Administration, Veterans   ADMISSION DIAGNOSIS:  Elevated troponin [R74.8] Urinary tract infection with hematuria, site unspecified [N39.0, R31.9] Chest pain, unspecified type [R07.9] Systolic congestive heart failure, unspecified HF chronicity (HCC) [I50.20] Acute respiratory failure with hypoxia (HCC) [J96.01] DISCHARGE DIAGNOSIS:  Active Problems:   Acute respiratory failure with hypoxia (HCC)  SECONDARY DIAGNOSIS:   Past Medical History:  Diagnosis Date  . Atrial fibrillation (HCC)   . CAD in native artery    a. stress echo 12/2007 abnl, EF > 55%, b. LHC 01/28/08: mLAD 30, D1 40, dLCx 70, pRCA 30, mRCA 70, mRCA lesion 2 80, PDA 90, s/p PCI/BMS to prox and distal RCA, s/p PCI/BMS to PDA; c. patient reports PCI/stenting x 2 in early 2017 at the [facility] (no records on file) d. 06/2016: cath showing patent stents along RCA and LCx with moderate 40% stenosis along the LAD.   [name]. Chronic combined systolic (congestive) and diastolic (congestive) heart failure (HCC)    a. echo 2009: > 55% b. 06/2016: EF 25-30% by echo in 06/2016, cath showing patent stents with moderate disease along LAD  . COPD (chronic obstructive pulmonary disease) (HCC)   . Diabetes mellitus without complication (HCC)   . Gout   . Hyperlipidemia   . Hypertension   . Hypertensive heart disease   . Obesity   . Tobacco abuse    HOSPITAL COURSE:  [name]  is a 62 y.o. male, veteran, with a known history of atrial fibrillation, on chronic anticoagulation with Eliquis, CAD, COPD, diabetes type 2, hypertension, pulmonary hypertension and other comorbidities. Patient presented to emergency room for acute onset of shortness of breath, associated with  chest tightness, worsening with exertion, going on for 24 hours, gradually getting worse.  1. Acute respiratory distress has resolved.  Breathing on room air. 2. Acute on chronic systolic congestive heart failure.  Last ejection fraction 01/2017 showed an EF of 25 to 30%.  As per cardiology note he is 12 pounds overweight.  She was treated with aggressive diuresis with IV Lasix.  Patient also on Coreg, losartan and Spironolactone.  Resume torsemide twice daily after discharge per cardiologist. 3. Atrial flutter on Coreg for rate control and Xarelto for anticoagulation 4. Acute cystitis with E. coli and hematuria.  On IV Rocephin.  Change to p.o. Keflex.   5. History of COPD.  No signs of acute exacerbation continue nebulizers and inhalers. 6. Type 2 diabetes mellitus on sliding scale. 7. Hyperlipidemia unspecified on atorvastatin. 8. Chronic kidney disease stage II.  Continue to monitor with diuresis. I discussed with cardiology PA Mr. Shea EvansDunn. DISCHARGE CONDITIONS:  Stable, discharged to home today. CONSULTS OBTAINED:  Treatment Team:  Yvonne KendallEnd, Christopher, MD DRUG ALLERGIES:   Allergies  Allergen Reactions  . Aspirin Other (See Comments)    Told not to take because of blood thinner  . Ibuprofen Nausea And Vomiting  . Tylenol [Acetaminophen] Nausea And Vomiting   DISCHARGE MEDICATIONS:   Allergies as of 01/21/2018      Reactions   Aspirin Other (See Comments)   Told not to take because of blood thinner   Ibuprofen Nausea And Vomiting   Tylenol [acetaminophen] Nausea And Vomiting      Medication  List    STOP taking these medications   guaiFENesin-dextromethorphan 100-10 MG/5ML syrup Commonly known as:  ROBITUSSIN DM   oxyCODONE-acetaminophen 5-325 MG tablet Commonly known as:  PERCOCET   predniSONE 20 MG tablet Commonly known as:  DELTASONE     TAKE these medications   atorvastatin 40 MG tablet Commonly known as:  LIPITOR Take 40 mg by mouth daily.   budesonide-formoterol  160-4.5 MCG/ACT inhaler Commonly known as:  SYMBICORT Inhale 2 puffs into the lungs 2 (two) times daily.   carvedilol 25 MG tablet Commonly known as:  COREG Take 1 tablet (25 mg total) by mouth 2 (two) times daily with a meal.   cephALEXin 500 MG capsule Commonly known as:  KEFLEX Take 1 capsule (500 mg total) by mouth every 12 (twelve) hours. Start taking on:  01/22/2018   COMBIVENT 18-103 MCG/ACT inhaler Generic drug:  albuterol-ipratropium Inhale 2 puffs into the lungs every 4 (four) hours as needed for wheezing.   docusate sodium 100 MG capsule Commonly known as:  COLACE Take 100 mg by mouth 2 (two) times daily.   losartan 25 MG tablet Commonly known as:  COZAAR Take 2 tablets (50 mg total) by mouth daily. What changed:  how much to take   metFORMIN 1000 MG tablet Commonly known as:  GLUCOPHAGE Take 1,000 mg by mouth 2 (two) times daily with a meal.   methocarbamol 500 MG tablet Commonly known as:  ROBAXIN Take 1 tablet (500 mg total) by mouth 4 (four) times daily.   nitroGLYCERIN 0.4 MG SL tablet Commonly known as:  NITROSTAT Place 0.4 mg under the tongue every 5 (five) minutes as needed for chest pain.   oxyCODONE 5 MG immediate release tablet Commonly known as:  Oxy IR/ROXICODONE Take 1 tablet (5 mg total) by mouth every 6 (six) hours as needed for severe pain.   potassium chloride SA 20 MEQ tablet Commonly known as:  K-DUR,KLOR-CON Take 1 tablet (20 mEq total) by mouth daily. Start taking on:  01/22/2018   rivaroxaban 20 MG Tabs tablet Commonly known as:  XARELTO Take 1 tablet (20 mg total) by mouth daily with supper.   spironolactone 25 MG tablet Commonly known as:  ALDACTONE Take 1 tablet (25 mg total) by mouth daily.   torsemide 20 MG tablet Commonly known as:  DEMADEX Take 2 tablets (40 mg total) by mouth 2 (two) times daily. What changed:  when to take this   traZODone 50 MG tablet Commonly known as:  DESYREL Take 50 mg by mouth at bedtime.          DISCHARGE INSTRUCTIONS:  See AVS.  If you experience worsening of your admission symptoms, develop shortness of breath, life threatening emergency, suicidal or homicidal thoughts you must seek medical attention immediately by calling 911 or calling your MD immediately  if symptoms less severe.  You Must read complete instructions/literature along with all the possible adverse reactions/side effects for all the Medicines you take and that have been prescribed to you. Take any new Medicines after you have completely understood and accpet all the possible adverse reactions/side effects.   Please note  You were cared for by a hospitalist during your hospital stay. If you have any questions about your discharge medications or the care you received while you were in the hospital after you are discharged, you can call the unit and asked to speak with the hospitalist on call if the hospitalist that took care of you is not available. Once you  are discharged, your primary care physician will handle any further medical issues. Please note that NO REFILLS for any discharge medications will be authorized once you are discharged, as it is imperative that you return to your primary care physician (or establish a relationship with a primary care physician if you do not have one) for your aftercare needs so that they can reassess your need for medications and monitor your lab values.    On the day of Discharge:  VITAL SIGNS:  Blood pressure (!) 154/84, pulse 70, temperature 98.4 F (36.9 C), temperature source Oral, resp. rate 16, height 6' (1.829 m), weight 220 lb 3.2 oz (99.9 kg), SpO2 100 %. PHYSICAL EXAMINATION:  GENERAL:  62 y.o.-year-old patient lying in the bed with no acute distress.  EYES: Pupils equal, round, reactive to light and accommodation. No scleral icterus. Extraocular muscles intact.  HEENT: Head atraumatic, normocephalic. Oropharynx and nasopharynx clear.  NECK:  Supple, no jugular  venous distention. No thyroid enlargement, no tenderness.  LUNGS: Normal breath sounds bilaterally, no wheezing, rales,rhonchi or crepitation. No use of accessory muscles of respiration.  CARDIOVASCULAR: S1, S2 normal. No murmurs, rubs, or gallops.  ABDOMEN: Soft, non-tender, non-distended. Bowel sounds present. No organomegaly or mass.  EXTREMITIES: No pedal edema, cyanosis, or clubbing.  NEUROLOGIC: Cranial nerves II through XII are intact. Muscle strength 4/5 in all extremities. Sensation intact. Gait not checked.  PSYCHIATRIC: The patient is alert and oriented x 3.  SKIN: No obvious rash, lesion, or ulcer.  DATA REVIEW:   CBC Recent Labs  Lab 01/21/18 0323  WBC 7.5  HGB 13.8  13.7  HCT 42.0  PLT 104*    Chemistries  Recent Labs  Lab 01/20/18 0541 01/21/18 0323  NA 140 141  K 3.1* 3.1*  CL 107 106  CO2 24 27  GLUCOSE 117* 190*  BUN 13 16  CREATININE 1.21 1.18  CALCIUM 8.7* 8.6*  MG  --  1.9  AST 27  --   ALT 23  --   ALKPHOS 146*  --   BILITOT 1.6*  --      Microbiology Results  Results for orders placed or performed during the hospital encounter of 01/19/18  Urine culture     Status: Abnormal   Collection Time: 01/19/18  5:28 PM  Result Value Ref Range Status   Specimen Description   Final    URINE, CLEAN CATCH Performed at [facility], [address] [place], [place] [address]    Special Requests   Final    NONE Performed at [facility], [address] [place], [place] [address]    Culture >=100,000 COLONIES/mL ESCHERICHIA COLI (A)  Final   Report Status 01/21/2018 FINAL  Final   Organism ID, Bacteria ESCHERICHIA COLI (A)  Final      Susceptibility   Escherichia coli - MIC*    AMPICILLIN <=2 SENSITIVE Sensitive     CEFAZOLIN <=4 SENSITIVE Sensitive     CEFTRIAXONE <=1 SENSITIVE Sensitive     CIPROFLOXACIN <=0.25 SENSITIVE Sensitive     GENTAMICIN <=1 SENSITIVE Sensitive     IMIPENEM <=0.25 SENSITIVE Sensitive      NITROFURANTOIN 32 SENSITIVE Sensitive     TRIMETH/SULFA <=20 SENSITIVE Sensitive     AMPICILLIN/SULBACTAM <=2 SENSITIVE Sensitive     PIP/TAZO <=4 SENSITIVE Sensitive     Extended ESBL NEGATIVE Sensitive     * >=100,000 COLONIES/mL ESCHERICHIA COLI    RADIOLOGY:  No results found.   Management plans discussed  with the patient and family and they are in agreement.  CODE STATUS: Prior   TOTAL TIME TAKING CARE OF THIS PATIENT: 35 minutes.    Shaune Pollack M.D on 01/21/2018 at 6:44 PM  Between 7am to 6pm - Pager - [phone]  After 6pm go to [email or web address] - Scientist, research (life sciences) [facility]  Office  [phone]  CC: Primary care physician; Administration, Veterans   Note: This dictation was prepared with Nurse, children's dictation along with smaller Lobbyist. Any transcriptional errors that result from this process are unintentional.

## 2018-01-21 NOTE — Progress Notes (Signed)
Progress Note  Patient Name: [name] Date of Encounter: 01/21/2018  Primary Cardiologist: [facility] in atrial flutter with variable AV block with well controlled ventricular rates in the 70s bpm. Documented UOP of 610 mL for the past 24 hours and net - 2 L for the admission. No weight this morning. Renal function continues to improve with diuresis 1.25-->1.21-->1.18. Potassium remains low at 3.1. HGB stable at 13.7. Echo on 7/24 showed a low, though slightly improved EF of 30-35% (prior 25-30% by echo in 01/2017), diffuse HK, mild MR, mildly dilated LA, mildly reduced RVSF, mildly dilated RA.   SOB is much improved. Has ambulated in the hallway without issues. Patient reports vigorous UOP (has been flushing). Patient indicates he needs to be discharged by 1030 this morning as his wife has to travel to [place], [place] for a surgery on 7/26 and he needs to be there with her.    Inpatient Medications    Scheduled Meds: . atorvastatin  40 mg Oral Daily  . carvedilol  25 mg Oral BID WC  . docusate sodium  100 mg Oral BID  . fluticasone furoate-vilanterol  1 puff Inhalation Daily  . furosemide  40 mg Intravenous BID  . insulin aspart  0-15 Units Subcutaneous TID WC  . insulin aspart  0-5 Units Subcutaneous QHS  . losartan  50 mg Oral Daily  . potassium chloride  40 mEq Oral Daily  . rivaroxaban  20 mg Oral Q supper  . spironolactone  25 mg Oral Daily  . traZODone  50 mg Oral QHS   Continuous Infusions: . cefTRIAXone (ROCEPHIN)  IV Stopped (01/21/18 0108)   PRN Meds: bisacodyl, HYDROcodone-acetaminophen, ipratropium-albuterol, nitroGLYCERIN, ondansetron **OR** ondansetron (ZOFRAN) IV, oxyCODONE   Vital Signs    Vitals:   01/20/18 1526 01/20/18 1732 01/20/18 2100 01/21/18 0427  BP: 129/72 135/83 132/64 (!) 142/80  Pulse: 76 81 65 72  Resp: _0 Temp: 98.6 F (37 C) 98.5 F (36.9 C) 99.3 F (37.4 C) 98.4 F (36.9 C)  TempSrc: Oral  Oral Oral Oral  SpO2: 98% 100% 100% 100%  Weight:      Height:        Intake/Output Summary (Last 24 hours) at 01/21/2018 0916 Last data filed at 01/21/2018 0600 Gross per 24 hour  Intake 340 ml  Output 900 ml  Net -560 ml   Filed Weights   01/19/18 1438 01/20/18 0509  Weight: 220 lb (99.8 kg) 220 lb 3.2 oz (99.9 kg)    Telemetry    Atrial flutter with variable AV block, occasional PVC, rare ventricular triplet, 70s bpm - Personally Reviewed  ECG    n/a - Personally Reviewed  Physical Exam   GEN: No acute distress.   Neck: No JVD. Cardiac: Irregular, no murmurs, rubs, or gallops.  Respiratory: Clear to auscultation bilaterally.  GI: Soft, nontender, non-distended.   MS: No edema; No deformity. Neuro:  Alert and oriented x 3; Nonfocal.  Psych: Normal affect.  Labs    Chemistry Recent Labs  Lab 01/19/18 1439 01/20/18 0541 01/21/18 0323  NA 143 140 141  K 4.0 3.1* 3.1*  CL 110 107 106  CO2 _1 GLUCOSE 124* 117* 190*  BUN _2 CREATININE 1.25* 1.21 1.18  CALCIUM 9.5 8.7* 8.6*  PROT  --  7.5  --   ALBUMIN  --  3.4*  --   AST  --  27  --  ALT  --  23  --   ALKPHOS  --  146*  --   BILITOT  --  1.6*  --   GFRNONAA >60 >60 >60  GFRAA >60 >60 >60  ANIONGAP _0 Hematology Recent Labs  Lab 01/19/18 1439 01/20/18 0541 01/21/18 0323  WBC 7.7 7.8  --   RBC 5.06 4.75  --   HGB 15.0 14.0 13.7  HCT 45.6 42.6  --   MCV 90.1 89.7  --   MCH 29.7 29.4  --   MCHC 33.0 32.8  --   RDW 18.5* 18.4*  --   PLT 124* 105*  --     Cardiac Enzymes Recent Labs  Lab 01/19/18 1729 01/20/18 1147 01/20/18 1916 01/20/18 2330  TROPONINI 0.07* 0.08* 0.07* 0.07*   No results for input(s): TROPIPOC in the last 168 hours.   BNP Recent Labs  Lab 01/19/18 1729  BNP 935.0*     DDimer No results for input(s): DDIMER in the last 168 hours.   Radiology    Dg Chest 2 View  Result Date: 01/19/2018 IMPRESSION: Probable mild pulmonary vascular  congestion.  Cardiomegaly Electronically Signed   By: Ivar Drape M.D.   On: 01/19/2018 15:37   US Renal  Result Date: 01/20/2018 IMPRESSION: 1. Postvoid residual in the urinary bladder measures 141 mL. Mild urinary bladder wall thickening raises concern for a degree of cystitis. 2. No renal lesions are evident. There is a column of Bertin in each mid kidney, an anatomic variant. Electronically Signed   By: Lowella Grip III M.D.   On: 01/20/2018 09:56    Cardiac Studies   Echo 01/21/2018: Study Conclusions  - Left ventricle: The cavity size was at the upper limits of   normal. Wall thickness was normal. Systolic function was   moderately to severely reduced. The estimated ejection fraction   was in the range of 30% to 35%. Diffuse hypokinesis. - Aortic valve: Mildly thickened, mildly calcified leaflets.   Transvalvular velocity was within the normal range. There was no   stenosis. - Mitral valve: Mildly thickened leaflets . There was mild   regurgitation. - Left atrium: The atrium was mildly dilated. - Right ventricle: The cavity size was normal. Wall thickness was   normal. Systolic function was mildly reduced. - Right atrium: The atrium was mildly dilated.   Echo 02/19/2017: Study Conclusions  - Left ventricle: The cavity size was normal. There was moderate concentric hypertrophy. Systolic function was severely reduced. The estimated ejection fraction was in the range of 25% to 30%. Diffuse hypokinesis. Regional wall motion abnormalities cannot be excluded. The study is not technically sufficient to allow evaluation of LV diastolic function. - Mitral valve: There was mild to moderate regurgitation. - Left atrium: The atrium was mildly to moderately dilated. - Right ventricle: Systolic function was moderately reduced. - Pulmonary arteries: PA peak pressure: 36 mm Hg (S).  Impressions:  - Rhythm is atrial flutter.   LHC 07/28/2016: Conclusion      Prox RCA lesion, 0 %stenosed.  Mid RCA lesion, 10 %stenosed.  Mid Cx lesion, 0 %stenosed.  Mid Cx to Dist Cx lesion, 0 %stenosed.  Prox Cx lesion, 40 %stenosed.  Prox LAD lesion, 40 %stenosed.  Mid LAD lesion, 40 %stenosed.  Dist LAD lesion, 30 %stenosed.  LV end diastolic pressure is severely elevated.  1. Patent stents in the right coronary artery and left circumflex with no significant restenosis. Moderate LAD disease.  2. Right heart catheterization showed severely elevated filling pressures and severe pulmonary hypertension. RV pressure was 82/18, PDA 81/43 with a mean of 57. RA pressure was 11. Left ventricular end-diastolic pressure was 34 mmHg. Pulmonary capillary wedge pressure could not be obtained due to inability to advance the Swan-Ganz catheter. Cardiac output is 4.44 with a cardiac index of 1.94.  Recommendations: Continue medical therapy for coronary artery disease and systolic heart failure. The patient continues to be severely volume overloaded and will need another few days of IV diuresis.    Patient Profile     62 y.o. male with history of CAD s/p BMS to the distal RCA and BMS to the PDA in 2009 with reported stenting in 2017, chronic combined CHF due to ICM, pulmonary hypertension, PAF/flutter on Xarelto, hypertensive heart disease, COPD, and HLD who is being seen today for the evaluation of volume overload and atrial flutter with RVR.  Assessment & Plan    1. Typical atrial flutter with RVR: -Remains in atrial flutter with variable AV block with well controlled ventricular response -Continue Coreg for rate control -Continue Xarelto (he denies missing any doses) given his CHADS2VASc of at least 4 (CHF, HTN, DM, vascular disease) as long as his blood work allows -Will likely need DCCV once his acute illness (UTI) is improved and his adequately diuresed and BP is controlled, this can be set up in out office as an outpatient -Consider EP evaluation as  an outpatient for atrial flutter ablation   2. Acute on chronic combined systolic and diastolic CHF due to ICM/pulmonary hypertension: -His volume status is significantly improved -Likely in the setting of his atrial flutter with RVR and poorly controlled hypertension -Cannot exclude dietary noncompliance  -Echo as above with slightly improved EF -Patient requests to be discharged by 1030 AM today as he needs to travel to [place], [place] with his wife for her planned surgery on 7/26 -Given his volume status is significantly improved, this is not completely unreasonable  -We will give him an IV Lasix 40 mg this morning for the road -Discharge on PO torsemide 40 mg bid with KCl repletion (duiscussed with IM MD) -I will send our office a message to have the patient be seen first part of the following week for an hospital follow up and labs -Dry weight ~ 208 pounds -Order for daily weights and strict Is and Os placed -Continue Coreg, losartan, and spironolactone   3. Elevated troponin/CAD: -Mildly elevated and flat trending with a peak of 0.07  -Likely supply demand ischemia in the setting of known CAD as above with volume overload, atrial flutter with RVR, and poorly controlled hypertension -Echo as above -No indication for heparin infusion at this time -On Xarelto in place of ASA -No plans for inpatient ischemic evaluation at this time  4. Hematuria: -Resolved, will follow up with Urology as an outpatient  -HGB stable -Remains on Xarelto   5. Flank bruise: -Patient does not recall traumatic event -UDS negative -Consider GGT -Remains on Xarelto at this time -HGB stable -Lipase normal -AST/ALT normal -Alk phos 146  6. HTN: -Blood pressure poorly controlled upon admission in the 220s/120s -Improving -Continue carvedilol, losartan, spironolactone and IV Lasix as above -Discharge on torsemide as above  7. HLD: -Goal LDL < 70 -Last LDL of 64 from 12/2016 -Check FLP   -Lipitor 40 mg daily  8. COPD: -Per IM  9. Thrombocytopenia: -Stable -Needs close outpatient follow up  10. Fever/likely UTI: -Afebrile currently -Per IM  11. Hypokalemia: -Losartan -Spironolactone as above -KCl  For questions or updates, please contact [place] Please consult [email or web address] for contact info under Cardiology/STEMI.    Signed, Christell Faith, PA-C [place] Pager: [phone] 01/21/2018, 9:16 AM

## 2018-01-21 NOTE — Progress Notes (Signed)
IV and tele removed from patient. Discharge instructions given to patient along with hard copy prescriptions. Verbalized understanding. Wife at bedside will be transporting patient home.

## 2018-01-22 NOTE — Telephone Encounter (Signed)
Call placed to the patient. There was no answer.

## 2018-01-23 NOTE — Progress Notes (Deleted)
Cardiology Office Note  Date:  01/23/2018   ID:  [name], DOB 1955/08/28, MRN [id number]  PCP:  [facility]   No chief complaint on file.   HPI:  Mr. [name] is a 62 year old man with history of  CAD   PCI to the distal RCA and PDA in 2009, again in 2017,  chronic systolic heart failure,  pulmonary hypertension,  atrial fibrillation/flutter,  hypertensive heart disease,  COPD,  hyperlipidemia follows at the [place].  ardiology History of poor medication compliance Presenting to the hospital 01/20/2018 with shortness of breath, Malignant hypertension   In the hospital with acute respiratory failure and hypoxia was given IV Lasix Nitropaste Transient hematuria  Echocardiogram showed persistently low LVEF of 30 to 35%. On anticoagulation for atrial flutter  Echocardiogram genera 2018 with ejection fraction 25-30%  Cardiac Catheterization: 07/24/2016  Prox RCA lesion, 0 %stenosed.  Mid RCA lesion, 10 %stenosed.  Mid Cx lesion, 0 %stenosed.  Mid Cx to Dist Cx lesion, 0 %stenosed.  Prox Cx lesion, 40 %stenosed.  Prox LAD lesion, 40 %stenosed.  Mid LAD lesion, 40 %stenosed.  Dist LAD lesion, 30 %stenosed.  LV end diastolic pressure is severely elevated.  1. Patent stents in the right coronary artery and left circumflex with no significant restenosis. Moderate LAD disease.  2. Right heart catheterization showed severely elevated filling pressures and severe pulmonary hypertension. RV pressure was 82/18, PDA 81/43 with a mean of 57. RA pressure was 11. Left ventricular end-diastolic pressure was 34 mmHg. Pulmonary capillary wedge pressure could not be obtained due to inability to advance the Swan-Ganz catheter. Cardiac output is 4.44 with a cardiac index of 1.94.      PMH:   has a past medical history of Atrial fibrillation (HCC), CAD in native artery, Chronic combined systolic (congestive) and diastolic (congestive) heart failure (HCC), COPD (chronic  obstructive pulmonary disease) (HCC), Diabetes mellitus without complication (HCC), Gout, Hyperlipidemia, Hypertension, Hypertensive heart disease, Obesity, and Tobacco abuse.  PSH:    Past Surgical History:  Procedure Laterality Date  . CARDIAC CATHETERIZATION  2009   [facility];   . CARDIAC CATHETERIZATION  2010  . CARDIAC CATHETERIZATION N/A 07/28/2016   Procedure: Right and Left Heart Cath and possible PCI;  Surgeon: Iran Ouch, MD;  Location: [facility] INVASIVE CV LAB;  Service: Cardiovascular;  Laterality: N/A;  . CARDIOVERSION N/A 03/27/2017   Procedure: CARDIOVERSION;  Surgeon: Iran Ouch, MD;  Location: [facility] ORS;  Service: Cardiovascular;  Laterality: N/A;  . CORONARY ANGIOPLASTY  2009   s/p stent placement at [facility].    Current Outpatient Medications  Medication Sig Dispense Refill  . albuterol-ipratropium (COMBIVENT) 18-103 MCG/ACT inhaler Inhale 2 puffs into the lungs every 4 (four) hours as needed for wheezing.    [name] atorvastatin (LIPITOR) 40 MG tablet Take 40 mg by mouth daily.     . budesonide-formoterol (SYMBICORT) 160-4.5 MCG/ACT inhaler Inhale 2 puffs into the lungs 2 (two) times daily.    . carvedilol (COREG) 25 MG tablet Take 1 tablet (25 mg total) by mouth 2 (two) times daily with a meal. 60 tablet 0  . cephALEXin (KEFLEX) 500 MG capsule Take 1 capsule (500 mg total) by mouth every 12 (twelve) hours. 8 capsule 0  . docusate sodium (COLACE) 100 MG capsule Take 100 mg by mouth 2 (two) times daily.    [name] losartan (COZAAR) 25 MG tablet Take 2 tablets (50 mg total) by mouth daily. 60 tablet 0  . metFORMIN (GLUCOPHAGE) 1000 MG tablet Take  1,000 mg by mouth 2 (two) times daily with a meal.     . methocarbamol (ROBAXIN) 500 MG tablet Take 1 tablet (500 mg total) by mouth 4 (four) times daily. 20 tablet 0  . nitroGLYCERIN (NITROSTAT) 0.4 MG SL tablet Place 0.4 mg under the tongue every 5 (five) minutes as needed for chest pain.    [name]. oxyCODONE (OXY IR/ROXICODONE) 5 MG immediate  release tablet Take 1 tablet (5 mg total) by mouth every 6 (six) hours as needed for severe pain. 12 tablet 0  . potassium chloride SA (K-DUR,KLOR-CON) 20 MEQ tablet Take 1 tablet (20 mEq total) by mouth daily. 10 tablet 0  . rivaroxaban (XARELTO) 20 MG TABS tablet Take 1 tablet (20 mg total) by mouth daily with supper. 30 tablet 0  . spironolactone (ALDACTONE) 25 MG tablet Take 1 tablet (25 mg total) by mouth daily. 30 tablet 0  . torsemide (DEMADEX) 20 MG tablet Take 2 tablets (40 mg total) by mouth 2 (two) times daily. 60 tablet 0  . traZODone (DESYREL) 50 MG tablet Take 50 mg by mouth at bedtime.     No current facility-administered medications for this visit.      Allergies:   Aspirin; Ibuprofen; and Tylenol [acetaminophen]   Social History:  The patient  reports that he has quit smoking. His smoking use included cigarettes. He has a 35.00 pack-year smoking history. He has never used smokeless tobacco. He reports that he does not drink alcohol or use drugs.   Family History:   family history includes Heart attack (age of onset: 3655) in his mother.    Review of Systems: ROS   PHYSICAL EXAM: VS:  There were no vitals taken for this visit. , BMI There is no height or weight on file to calculate BMI. GEN: Well nourished, well developed, in no acute distress HEENT: normal Neck: no JVD, carotid bruits, or masses Cardiac: RRR; no murmurs, rubs, or gallops,no edema  Respiratory:  clear to auscultation bilaterally, normal work of breathing GI: soft, nontender, nondistended, + BS MS: no deformity or atrophy Skin: warm and dry, no rash Neuro:  Strength and sensation are intact Psych: euthymic mood, full affect    Recent Labs: 03/26/2017: TSH 1.129 01/19/2018: B Natriuretic Peptide 935.0 01/20/2018: ALT 23 01/21/2018: BUN 16; Creatinine, Ser 1.18; Hemoglobin 13.7; Hemoglobin 13.8; Magnesium 1.9; Platelets 104; Potassium 3.1; Sodium 141    Lipid Panel Lab Results  Component Value  Date   CHOL 154 01/21/2018   HDL 40 (L) 01/21/2018   LDLCALC 96 01/21/2018   TRIG 92 01/21/2018      Wt Readings from Last 3 Encounters:  01/20/18 220 lb 3.2 oz (99.9 kg)  12/17/17 220 lb (99.8 kg)  08/08/17 226 lb (102.5 kg)       ASSESSMENT AND PLAN:  No diagnosis found.   Disposition:   F/U  6 months  No orders of the defined types were placed in this encounter.    Signed, Dossie Arbourim Tywone Bembenek, M.D., Ph.D. 01/23/2018  [facility], [place] [phone]

## 2018-01-25 ENCOUNTER — Telehealth: Payer: Self-pay

## 2018-01-25 NOTE — Telephone Encounter (Signed)
Flagged on EMMI report for having questions about discharge papers or other issues, as well as not having a follow up scheduled.  Called and spoke with patient.  He mentioned he doesn't have any questions or concerns at this time and is aware of his follow up appointments.  I thanked him for his time and informed him that he would receive one more automated call checking on him in the next few days.

## 2018-01-25 NOTE — Telephone Encounter (Signed)
No answer. Left message to call back.   

## 2018-01-26 ENCOUNTER — Ambulatory Visit: Payer: Non-veteran care | Admitting: Cardiovascular Disease

## 2018-01-26 NOTE — Telephone Encounter (Signed)
No answer. Left message to call back.  Third attempt unsuccessful. Closing encounter.

## 2018-01-27 ENCOUNTER — Encounter: Payer: Self-pay | Admitting: Cardiovascular Disease

## 2018-02-02 ENCOUNTER — Ambulatory Visit: Payer: Non-veteran care | Admitting: Family

## 2018-02-02 NOTE — Progress Notes (Deleted)
02/03/2018 9:57 PM   [name] 1956/05/19 [id number]  Referring provider: [facility] [address] [place], [place] [address]  No chief complaint on file.   HPI: Patient is a 62 year old African-American male who presents today as a referral from [facility] ED for gross hematuria.    He was recently hospitalized for respiratory sepsis and an UTI associated with gross hematuria.    He does not have a prior history of recurrent urinary tract infections, nephrolithiasis, trauma to the genitourinary tract, BPH or malignancies of the genitourinary tract. ***  She/He does/does not have a family medical history of nephrolithiasis, malignancies of the genitourinary tract or hematuria. ***  Today, she/he are having/not having symptoms of frequent urination, urgency, dysuria, nocturia, incontinence, hesitancy, intermittency, straining to urinate or a weak urinary stream.  Patient denies any gross hematuria, dysuria or suprapubic/flank pain.  Patient denies any fevers, chills, nausea or vomiting.  Her/His UA today demonstrates ***.  She/He have/have not had any recent imaging studies. ***  He/She are/are not a smoker. She/He is a former smoker, with a*** ppd history.  Quit *** years ago.  They are/are not exposed to secondhand smoke.  They have/have not worked with Personnel officer, trichloroethylene, etc.   ***  He/She has HTN. ***   He/She has a high BMI.     PMH: Past Medical History:  Diagnosis Date  . Atrial fibrillation (HCC)   . CAD in native artery    a. stress echo 12/2007 abnl, EF > 55%, b. LHC 01/28/08: mLAD 30, D1 40, dLCx 70, pRCA 30, mRCA 70, mRCA lesion 2 80, PDA 90, s/p PCI/BMS to prox and distal RCA, s/p PCI/BMS to PDA; c. patient reports PCI/stenting x 2 in early 2017 at the [place] (no records on file) d. 06/2016: cath showing patent stents along RCA and LCx with moderate 40% stenosis along the LAD.   [name] Chronic combined systolic (congestive) and diastolic  (congestive) heart failure (HCC)    a. echo 2009: > 55% b. 06/2016: EF 25-30% by echo in 06/2016, cath showing patent stents with moderate disease along LAD  . COPD (chronic obstructive pulmonary disease) (HCC)   . Diabetes mellitus without complication (HCC)   . Gout   . Hyperlipidemia   . Hypertension   . Hypertensive heart disease   . Obesity   . Tobacco abuse     Surgical History: Past Surgical History:  Procedure Laterality Date  . CARDIAC CATHETERIZATION  2009   [facility];   . CARDIAC CATHETERIZATION  2010  . CARDIAC CATHETERIZATION N/A 07/28/2016   Procedure: Right and Left Heart Cath and possible PCI;  Surgeon: Iran Ouch, MD;  Location: [facility] INVASIVE CV LAB;  Service: Cardiovascular;  Laterality: N/A;  . CARDIOVERSION N/A 03/27/2017   Procedure: CARDIOVERSION;  Surgeon: Iran Ouch, MD;  Location: [facility] ORS;  Service: Cardiovascular;  Laterality: N/A;  . CORONARY ANGIOPLASTY  2009   s/p stent placement at [facility].    Home Medications:  Allergies as of 02/03/2018      Reactions   Aspirin Other (See Comments)   Told not to take because of blood thinner   Ibuprofen Nausea And Vomiting   Tylenol [acetaminophen] Nausea And Vomiting      Medication List        Accurate as of 02/02/18  9:57 PM. Always use your most recent med list.          atorvastatin 40 MG tablet Commonly known as:  LIPITOR Take 40  mg by mouth daily.   budesonide-formoterol 160-4.5 MCG/ACT inhaler Commonly known as:  SYMBICORT Inhale 2 puffs into the lungs 2 (two) times daily.   carvedilol 25 MG tablet Commonly known as:  COREG Take 1 tablet (25 mg total) by mouth 2 (two) times daily with a meal.   cephALEXin 500 MG capsule Commonly known as:  KEFLEX Take 1 capsule (500 mg total) by mouth every 12 (twelve) hours.   COMBIVENT 18-103 MCG/ACT inhaler Generic drug:  albuterol-ipratropium Inhale 2 puffs into the lungs every 4 (four) hours as needed for wheezing.   docusate sodium 100 MG  capsule Commonly known as:  COLACE Take 100 mg by mouth 2 (two) times daily.   losartan 25 MG tablet Commonly known as:  COZAAR Take 2 tablets (50 mg total) by mouth daily.   metFORMIN 1000 MG tablet Commonly known as:  GLUCOPHAGE Take 1,000 mg by mouth 2 (two) times daily with a meal.   methocarbamol 500 MG tablet Commonly known as:  ROBAXIN Take 1 tablet (500 mg total) by mouth 4 (four) times daily.   nitroGLYCERIN 0.4 MG SL tablet Commonly known as:  NITROSTAT Place 0.4 mg under the tongue every 5 (five) minutes as needed for chest pain.   oxyCODONE 5 MG immediate release tablet Commonly known as:  Oxy IR/ROXICODONE Take 1 tablet (5 mg total) by mouth every 6 (six) hours as needed for severe pain.   potassium chloride SA 20 MEQ tablet Commonly known as:  K-DUR,KLOR-CON Take 1 tablet (20 mEq total) by mouth daily.   rivaroxaban 20 MG Tabs tablet Commonly known as:  XARELTO Take 1 tablet (20 mg total) by mouth daily with supper.   spironolactone 25 MG tablet Commonly known as:  ALDACTONE Take 1 tablet (25 mg total) by mouth daily.   torsemide 20 MG tablet Commonly known as:  DEMADEX Take 2 tablets (40 mg total) by mouth 2 (two) times daily.   traZODone 50 MG tablet Commonly known as:  DESYREL Take 50 mg by mouth at bedtime.       Allergies:  Allergies  Allergen Reactions  . Aspirin Other (See Comments)    Told not to take because of blood thinner  . Ibuprofen Nausea And Vomiting  . Tylenol [Acetaminophen] Nausea And Vomiting    Family History: Family History  Problem Relation Age of Onset  . Heart attack Mother 44    Social History:  reports that he has quit smoking. His smoking use included cigarettes. He has a 35.00 pack-year smoking history. He has never used smokeless tobacco. He reports that he does not drink alcohol or use drugs.  ROS:                                        Physical Exam: There were no vitals taken for  this visit.  Constitutional:  Well nourished. Alert and oriented, No acute distress. HEENT: [place] AT, moist mucus membranes.  Trachea midline, no masses. Cardiovascular: No clubbing, cyanosis, or edema. Respiratory: Normal respiratory effort, no increased work of breathing. GI: Abdomen is soft, non tender, non distended, no abdominal masses. Liver and spleen not palpable.  No hernias appreciated.  Stool sample for occult testing is not indicated.   GU: No CVA tenderness.  No bladder fullness or masses.  Patient with circumcised/uncircumcised phallus. ***Foreskin easily retracted***  Urethral meatus is patent.  No penile discharge. No  penile lesions or rashes. Scrotum without lesions, cysts, rashes and/or edema.  Testicles are located scrotally bilaterally. No masses are appreciated in the testicles. Left and right epididymis are normal. Normal external genitalia, normal pubic hair distribution, no lesions.  Normal urethral meatus, no lesions, no prolapse, no discharge.   No urethral masses, tenderness and/or tenderness. No bladder fullness, tenderness or masses. Normal vagina mucosa, good estrogen effect, no discharge, no lesions, good pelvic support, no cystocele or rectocele noted.  No cervical motion tenderness.  Uterus is freely mobile and non-fixed.  No adnexal/parametria masses or tenderness noted.  Anus and perineum are without rashes or lesions.   ***  Rectal: Patient with  normal sphincter tone. Anus and perineum without scarring or rashes. No rectal masses are appreciated. Prostate is approximately *** grams, *** nodules are appreciated. Seminal vesicles are normal. Skin: No rashes, bruises or suspicious lesions. Lymph: No cervical or inguinal adenopathy. Neurologic: Grossly intact, no focal deficits, moving all 4 extremities. Psychiatric: Normal mood and affect.  Laboratory Data: Lab Results  Component Value Date   WBC 7.5 01/21/2018   HGB 13.7 01/21/2018   HGB 13.8 01/21/2018   HCT 42.0  01/21/2018   MCV 90.0 01/21/2018   PLT 104 (L) 01/21/2018    Lab Results  Component Value Date   CREATININE 1.18 01/21/2018    No results found for: PSA  No results found for: TESTOSTERONE  Lab Results  Component Value Date   HGBA1C 7.3 (H) 01/05/2017    Lab Results  Component Value Date   TSH 1.129 03/26/2017       Component Value Date/Time   CHOL 154 01/21/2018 0323   CHOL 191 12/09/2012 0439   HDL 40 (L) 01/21/2018 0323   HDL 54 12/09/2012 0439   CHOLHDL 3.9 01/21/2018 0323   VLDL 18 01/21/2018 0323   VLDL 16 12/09/2012 0439   LDLCALC 96 01/21/2018 0323   LDLCALC 121 (H) 12/09/2012 0439    Lab Results  Component Value Date   AST 27 01/20/2018   Lab Results  Component Value Date   ALT 23 01/20/2018   No components found for: ALKALINEPHOPHATASE No components found for: BILIRUBINTOTAL  No results found for: ESTRADIOL   Urinalysis ***  I have reviewed the labs.  Pertinent Imaging: CLINICAL DATA:  Hematuria for 2 days  EXAM: RENAL / URINARY TRACT ULTRASOUND COMPLETE  COMPARISON:  CT abdomen and pelvis January 15, 2012  FINDINGS: Right Kidney:  Length: 12.4 cm. Echogenicity and renal echogenicity are within normal limits. No mass, perinephric fluid, or hydronephrosis visualized. No sonographically demonstrable calculus or ureterectasis. There is a prominent column of Bertin in the mid kidney on the right, an anatomic variant.  Left Kidney:  Length: 11.3 cm. Echogenicity and renal echogenicity are within normal limits. No mass, perinephric fluid, or hydronephrosis visualized. There is a prominent column of Bertin in the mid kidney on the left, an anatomic variant. No sonographically demonstrable calculus or ureterectasis.  Bladder:  Mild urinary bladder wall thickening noted. No mass seen. Postvoid residual within the urinary bladder measures 141 mL.  IMPRESSION: 1. Postvoid residual in the urinary bladder measures 141 mL.  Mild urinary bladder wall thickening raises concern for a degree of cystitis.  2. No renal lesions are evident. There is a column of Bertin in each mid kidney, an anatomic variant.   Electronically Signed   By: Bretta Bang III M.D.   On: 01/20/2018 09:56  I have independently reviewed the films  Assessment &  Plan:  ***  1. *** hematuria Explained to the patient that there are a number of causes that can be associated with blood in the urine, such as stones, *** BPH, UTI's, damage to the urinary tract and/or cancer. At this time, I felt that the patient warranted further urologic evaluation.   The AUA guidelines state that a CT urogram is the preferred imaging study to evaluate hematuria. I explained to the patient that a contrast material will be injected into a vein and that in rare instances, an allergic reaction can result and may even life threatening   The patient denies any allergies to contrast***, iodine and/or seafood*** and is not taking metformin.*** Following the imaging study,  I've recommended a cystoscopy. I described how this is performed, typically in an office setting with a flexible cystoscope. We described the risks, benefits, and possible side effects, the most common of which is a minor amount of blood in the urine and/or burning which usually resolves in 24 to 48 hours.  *** The patient had the opportunity to ask questions which were answered. Based upon this discussion, the patient is willing to proceed. Therefore, I've ordered: a CT Urogram and cystoscopy. ***  - The patient will return following all of the above for discussion of the results.   - UA ***  - Urine culture ***  - BUN + creatinine  ***    No follow-ups on file.  These notes generated with voice recognition software. I apologize for typographical errors.  Michiel CowboySHANNON Atreus Hasz, PA-C  [facility] [address]  [place], [place] [address] [phone]  [phone]

## 2018-02-03 ENCOUNTER — Ambulatory Visit: Payer: Self-pay | Admitting: Urology

## 2018-02-03 ENCOUNTER — Encounter: Payer: Self-pay | Admitting: Urology

## 2018-02-10 NOTE — Progress Notes (Deleted)
   Patient ID: Jonell CluckSamuel Gilder, male    DOB: 1956/05/31, 62 y.o.   MRN: [id number]  HPI  [name] is a 62 y/o male with a history of  Echo report from 01/20/18 reviewed showed an EF of 30-35% along with mild MR.   Admitted 01/19/18 due to acute on chronic HF. Initially needed IV lasix and then transitioned to oral diuretics. Cardiology consult obtained.   Review of Systems    Physical Exam  Assessment & Plan:  1: Chronic heart failure with reduced ejection fraction- - NYHA class  2:

## 2018-02-11 ENCOUNTER — Ambulatory Visit: Payer: Non-veteran care | Admitting: Family

## 2018-02-11 ENCOUNTER — Telehealth: Payer: Self-pay | Admitting: Family

## 2018-02-11 NOTE — Telephone Encounter (Signed)
Patient did not show for his Heart Failure Clinic appointment on 02/11/18. Will attempt to reschedule.

## 2018-03-03 ENCOUNTER — Telehealth: Payer: Self-pay | Admitting: Family

## 2018-03-03 ENCOUNTER — Ambulatory Visit: Payer: Non-veteran care | Admitting: Family

## 2018-03-03 NOTE — Telephone Encounter (Signed)
Patient did not show for his Heart Failure Clinic appointment on 03/03/18. Will attempt to reschedule.  

## 2018-03-03 NOTE — Progress Notes (Deleted)
   Patient ID: Torri Hirt, male    DOB: June 15, 1956, 62 y.o.   MRN: [id number]  HPI  Mr Milic is a 62 y/o male with a history of  Echo report from 01/20/18 reviewed showed an EF of 30-35% along with mild MR.   Admitted 01/19/18 due to acute on chronic HF. Initially needed IV lasix and then transitioned to oral diuretics. Cardiology consult obtained. Medications adjusted and he was discharged after 2 days. Was in the ED 12/17/17 for back pain where he was treated and released.   He presents today for his initial visit with a chief complaint of   Review of Systems    Physical Exam  Assessment & Plan:  1: Chronic heart failure with reduced ejection fraction- - NYHA class - BNP 01/19/18 was 935.0  2: HTN- - BMP 01/21/18 reviewed and showed sodium 141, potassium 3.1, creatinine 1.18 and GFR >60; repeat potassium 01/28/18 was 3.7  3: DM- - A1c 01/05/17 was 7.3%

## 2018-03-04 ENCOUNTER — Other Ambulatory Visit: Payer: Self-pay

## 2018-03-04 ENCOUNTER — Encounter: Payer: Self-pay | Admitting: Emergency Medicine

## 2018-03-04 ENCOUNTER — Emergency Department
Admission: EM | Admit: 2018-03-04 | Discharge: 2018-03-04 | Disposition: A | Discharge status: Home / Self Care | Payer: Non-veteran care | Attending: Student in an Organized Health Care Education/Training Program | Admitting: Student in an Organized Health Care Education/Training Program

## 2018-03-04 ENCOUNTER — Emergency Department: Payer: Non-veteran care

## 2018-03-04 DIAGNOSIS — Z7901 Long term (current) use of anticoagulants: Secondary | ICD-10-CM | POA: Diagnosis not present

## 2018-03-04 DIAGNOSIS — J449 Chronic obstructive pulmonary disease, unspecified: Secondary | ICD-10-CM | POA: Diagnosis not present

## 2018-03-04 DIAGNOSIS — Z87891 Personal history of nicotine dependence: Secondary | ICD-10-CM | POA: Diagnosis not present

## 2018-03-04 DIAGNOSIS — N3000 Acute cystitis without hematuria: Secondary | ICD-10-CM

## 2018-03-04 DIAGNOSIS — E119 Type 2 diabetes mellitus without complications: Secondary | ICD-10-CM | POA: Insufficient documentation

## 2018-03-04 DIAGNOSIS — I5042 Chronic combined systolic (congestive) and diastolic (congestive) heart failure: Secondary | ICD-10-CM | POA: Diagnosis not present

## 2018-03-04 DIAGNOSIS — Z7984 Long term (current) use of oral hypoglycemic drugs: Secondary | ICD-10-CM | POA: Insufficient documentation

## 2018-03-04 DIAGNOSIS — Z79899 Other long term (current) drug therapy: Secondary | ICD-10-CM | POA: Diagnosis not present

## 2018-03-04 DIAGNOSIS — I110 Hypertensive heart disease with heart failure: Secondary | ICD-10-CM | POA: Insufficient documentation

## 2018-03-04 DIAGNOSIS — I2510 Atherosclerotic heart disease of native coronary artery without angina pectoris: Secondary | ICD-10-CM | POA: Insufficient documentation

## 2018-03-04 DIAGNOSIS — I509 Heart failure, unspecified: Secondary | ICD-10-CM | POA: Diagnosis not present

## 2018-03-04 DIAGNOSIS — R079 Chest pain, unspecified: Secondary | ICD-10-CM

## 2018-03-04 DIAGNOSIS — I10 Essential (primary) hypertension: Secondary | ICD-10-CM | POA: Insufficient documentation

## 2018-03-04 LAB — BASIC METABOLIC PANEL
Anion gap: 12 (ref 5–15)
BUN: 17 mg/dL (ref 8–23)
CO2: 29 mmol/L (ref 22–32)
Calcium: 9.6 mg/dL (ref 8.9–10.3)
Chloride: 98 mmol/L (ref 98–111)
Creatinine, Ser: 1.21 mg/dL (ref 0.61–1.24)
GFR calc Af Amer: >60 mL/min (ref >60)
GFR calc non Af Amer: >60 mL/min (ref >60)
Glucose, Bld: 137 mg/dL — ABNORMAL HIGH (ref 70–99)
Potassium: 3.5 mmol/L (ref 3.5–5.1)
Sodium: 139 mmol/L (ref 135–145)

## 2018-03-04 LAB — URINALYSIS, COMPLETE (UACMP) WITH MICROSCOPIC
BACTERIA UA: NONE SEEN
Bilirubin Urine: NEGATIVE
Glucose, UA: >=500 mg/dL — AB
Ketones, ur: NEGATIVE mg/dL
Nitrite: NEGATIVE
PROTEIN: 30 mg/dL — AB
SQUAMOUS EPITHELIAL / LPF: NONE SEEN (ref 0–5)
Specific Gravity, Urine: 1.006 (ref 1.005–1.030)
pH: 5 (ref 5.0–8.0)

## 2018-03-04 LAB — CBC
HCT: 46.2 % (ref 40.0–52.0)
Hemoglobin: 15.5 g/dL (ref 13.0–18.0)
MCH: 31 pg (ref 26.0–34.0)
MCHC: 33.5 g/dL (ref 32.0–36.0)
MCV: 92.6 fL (ref 80.0–100.0)
Platelets: 212 10*3/uL (ref 150–440)
RBC: 4.99 MIL/uL (ref 4.40–5.90)
RDW: 21.4 % — ABNORMAL HIGH (ref 11.5–14.5)
WBC: 11.9 10*3/uL — ABNORMAL HIGH (ref 3.8–10.6)

## 2018-03-04 LAB — TROPONIN I
TROPONIN I: 0.06 ng/mL — AB (ref <0.03)
Troponin I: 0.07 ng/mL (ref <0.03)

## 2018-03-04 LAB — BRAIN NATRIURETIC PEPTIDE: B Natriuretic Peptide: 524 pg/mL — ABNORMAL HIGH (ref 0.0–100.0)

## 2018-03-04 MED ORDER — CEPHALEXIN 500 MG PO CAPS: 500.0000 mg | ORAL_CAPSULE | Freq: Three times a day (TID) | ORAL | 0 refills | Status: AC

## 2018-03-04 MED ORDER — CEPHALEXIN 500 MG PO CAPS
500.0000 mg | ORAL_CAPSULE | Freq: Once | ORAL | Status: AC
  Administered 2018-03-04: 500 mg via ORAL
  Filled 2018-03-04: qty 1

## 2018-03-04 MED ORDER — CARVEDILOL 25 MG PO TABS: 25.0000 mg | ORAL_TABLET | Freq: Two times a day (BID) | ORAL | Status: DC

## 2018-03-04 NOTE — ED Notes (Signed)
Pt given meal tray and water 

## 2018-03-04 NOTE — ED Triage Notes (Signed)
Pt in via [facility] from home with complaints of chest pain x one week w/ associated dizziness.  Vitals WDL, NAD noted at this time.

## 2018-03-04 NOTE — ED Triage Notes (Signed)
FIRST NURSE NOTE-pulled for EKG.  Arrived EMS for chest pain.

## 2018-03-04 NOTE — ED Provider Notes (Signed)
[facility] Emergency Department Provider Note    First MD Initiated Contact with Patient 03/04/18 1702     (approximate)  I have reviewed the triage vital signs and the nursing notes.   HISTORY  Chief Complaint Chest Pain    HPI Jaysion Ramseyer is a 62 y.o. male extensive past medical history presents the ER for 1 week of chest tightness and discomfort as well as burning with urination.  States he has had chronic chest pain for several years and has known history of congestive heart failure.  Denies any shortness of breath.  No active chest pain at this time.  His primary concern is dysuria.  Has noted intermittent hematuria.  No flank pain.  Denies any abdominal pain.  Has not been sexually active in over 3 months.  Denies any recent history of STI.    Past Medical History:  Diagnosis Date  . Atrial fibrillation (HCC)   . CAD in native artery    a. stress echo 12/2007 abnl, EF > 55%, b. LHC 01/28/08: mLAD 30, D1 40, dLCx 70, pRCA 30, mRCA 70, mRCA lesion 2 80, PDA 90, s/p PCI/BMS to prox and distal RCA, s/p PCI/BMS to PDA; c. patient reports PCI/stenting x 2 in early 2017 at the [place] (no records on file) d. 06/2016: cath showing patent stents along RCA and LCx with moderate 40% stenosis along the LAD.   [name] Chronic combined systolic (congestive) and diastolic (congestive) heart failure (HCC)    a. echo 2009: > 55% b. 06/2016: EF 25-30% by echo in 06/2016, cath showing patent stents with moderate disease along LAD  . COPD (chronic obstructive pulmonary disease) (HCC)   . Diabetes mellitus without complication (HCC)   . Gout   . Hyperlipidemia   . Hypertension   . Hypertensive heart disease   . Obesity   . Tobacco abuse    Family History  Problem Relation Age of Onset  . Heart attack Mother 31   Past Surgical History:  Procedure Laterality Date  . CARDIAC CATHETERIZATION  2009   [facility];   . CARDIAC CATHETERIZATION  2010  . CARDIAC CATHETERIZATION N/A  07/28/2016   Procedure: Right and Left Heart Cath and possible PCI;  Surgeon: Iran Ouch, MD;  Location: [facility] INVASIVE CV LAB;  Service: Cardiovascular;  Laterality: N/A;  . CARDIOVERSION N/A 03/27/2017   Procedure: CARDIOVERSION;  Surgeon: Iran Ouch, MD;  Location: [facility] ORS;  Service: Cardiovascular;  Laterality: N/A;  . CORONARY ANGIOPLASTY  2009   s/p stent placement at [facility].   Patient Active Problem List   Diagnosis Date Noted  . Acute respiratory failure with hypoxia (HCC) 01/19/2018  . Atrial fibrillation with RVR (HCC) 03/26/2017  . Atrial flutter with rapid ventricular response (HCC) 02/18/2017  . Diastolic CHF, acute on chronic (HCC) 01/06/2017  . CAD in native artery   . Chest pain 01/05/2017  . Ischemic cardiomyopathy 07/26/2016  . Non-sustained ventricular tachycardia (HCC) 07/26/2016  . COPD (chronic obstructive pulmonary disease) (HCC) 07/24/2016  . CAD (coronary artery disease) 07/24/2016  . HLD (hyperlipidemia) 07/24/2016  . Acute on chronic combined systolic and diastolic CHF (congestive heart failure) (HCC) 07/24/2016  . Acute respiratory distress 07/24/2016  . Tobacco abuse 07/24/2016  . Accelerated hypertension 07/24/2016  . Hypertensive heart disease 07/24/2016      Prior to Admission medications   Medication Sig Start Date End Date Taking? Authorizing Provider  albuterol-ipratropium (COMBIVENT) 18-103 MCG/ACT inhaler Inhale 2 puffs into the lungs  every 4 (four) hours as needed for wheezing.    [provider]  atorvastatin (LIPITOR) 40 MG tablet Take 40 mg by mouth daily.     [provider]  budesonide-formoterol (SYMBICORT) 160-4.5 MCG/ACT inhaler Inhale 2 puffs into the lungs 2 (two) times daily.    [provider]  carvedilol (COREG) 25 MG tablet Take 1 tablet (25 mg total) by mouth 2 (two) times daily with a meal. 07/31/16   Enedina Finner, MD  cephALEXin (KEFLEX) 500 MG capsule Take 1 capsule (500 mg total) by mouth  every 12 (twelve) hours. 01/22/18   Shaune Pollack, MD  docusate sodium (COLACE) 100 MG capsule Take 100 mg by mouth 2 (two) times daily.    [provider]  losartan (COZAAR) 25 MG tablet Take 2 tablets (50 mg total) by mouth daily. 01/21/18   Shaune Pollack, MD  metFORMIN (GLUCOPHAGE) 1000 MG tablet Take 1,000 mg by mouth 2 (two) times daily with a meal.     [provider]  methocarbamol (ROBAXIN) 500 MG tablet Take 1 tablet (500 mg total) by mouth 4 (four) times daily. 12/17/17   Tommi Rumps, PA-C  nitroGLYCERIN (NITROSTAT) 0.4 MG SL tablet Place 0.4 mg under the tongue every 5 (five) minutes as needed for chest pain.    [provider]  oxyCODONE (OXY IR/ROXICODONE) 5 MG immediate release tablet Take 1 tablet (5 mg total) by mouth every 6 (six) hours as needed for severe pain. 12/17/17   Tommi Rumps, PA-C  potassium chloride SA (K-DUR,KLOR-CON) 20 MEQ tablet Take 1 tablet (20 mEq total) by mouth daily. 01/22/18   Shaune Pollack, MD  rivaroxaban (XARELTO) 20 MG TABS tablet Take 1 tablet (20 mg total) by mouth daily with supper. 02/19/17   Adrian Saran, MD  spironolactone (ALDACTONE) 25 MG tablet Take 1 tablet (25 mg total) by mouth daily. 08/01/16   Enedina Finner, MD  torsemide (DEMADEX) 20 MG tablet Take 2 tablets (40 mg total) by mouth 2 (two) times daily. 01/21/18   Shaune Pollack, MD  traZODone (DESYREL) 50 MG tablet Take 50 mg by mouth at bedtime.    [provider]    Allergies Aspirin; Ibuprofen; and Tylenol [acetaminophen]    Social History Social History   Tobacco Use  . Smoking status: Former Smoker    Packs/day: 1.00    Years: 35.00    Pack years: 35.00    Types: Cigarettes  . Smokeless tobacco: Never Used  Substance Use Topics  . Alcohol use: Not Currently    Comment: Quit 8 yrs.  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 Drug use: No    Review of Systems Patient denies headaches, rhinorrhea, blurry vision, numbness, shortness of breath, chest pain, edema,  cough, abdominal pain, nausea, vomiting, diarrhea, dysuria, fevers, rashes or hallucinations unless otherwise stated above in HPI. ____________________________________________   PHYSICAL EXAM:  VITAL SIGNS: Vitals:   03/04/18 1557  BP: 121/87  Pulse: 96  Resp: 16  Temp: 98.2 F (36.8 C)  SpO2: 100%    Constitutional: Alert and oriented.  Eyes: Conjunctivae are normal.  Head: Atraumatic. Nose: No congestion/rhinnorhea. Mouth/Throat: Mucous membranes are moist.   Neck: No stridor. Painless ROM.  Cardiovascular: Normal rate, regular rhythm. Grossly normal heart sounds.  Good peripheral circulation. Respiratory: Normal respiratory effort.  No retractions. Lungs with coarse bibasilar breath sounds Gastrointestinal: Soft and nontender. No distention. No abdominal bruits. No CVA tenderness. Genitourinary:  Musculoskeletal: No lower extremity tenderness nor edema.  No  joint effusions. Neurologic:  Normal speech and language. No gross focal neurologic deficits are appreciated. No facial droop Skin:  Skin is warm, dry and intact. No rash noted. Psychiatric: Mood and affect are normal. Speech and behavior are normal.  ____________________________________________   LABS (all labs ordered are listed, but only abnormal results are displayed)  Results for orders placed or performed during the hospital encounter of 03/04/18 (from the past 24 hour(s))  Basic metabolic panel     Status: Abnormal   Collection Time: 03/04/18  3:59 PM  Result Value Ref Range   Sodium 139 135 - 145 mmol/L   Potassium 3.5 3.5 - 5.1 mmol/L   Chloride 98 98 - 111 mmol/L   CO2 29 22 - 32 mmol/L   Glucose, Bld 137 (H) 70 - 99 mg/dL   BUN 17 8 - 23 mg/dL   Creatinine, Ser 1.61 0.61 - 1.24 mg/dL   Calcium 9.6 8.9 - 09.6 mg/dL   GFR calc non Af Amer >60 >60 mL/min   GFR calc Af Amer >60 >60 mL/min   Anion gap 12 5 - 15  CBC     Status: Abnormal   Collection Time: 03/04/18  3:59 PM  Result Value Ref Range    WBC 11.9 (H) 3.8 - 10.6 K/uL   RBC 4.99 4.40 - 5.90 MIL/uL   Hemoglobin 15.5 13.0 - 18.0 g/dL   HCT 04.5 40.9 - 81.1 %   MCV 92.6 80.0 - 100.0 fL   MCH 31.0 26.0 - 34.0 pg   MCHC 33.5 32.0 - 36.0 g/dL   RDW 91.4 (H) 78.2 - 95.6 %   Platelets 212 150 - 440 K/uL  Troponin I     Status: Abnormal   Collection Time: 03/04/18  3:59 PM  Result Value Ref Range   Troponin I 0.07 (HH) <0.03 ng/mL   ____________________________________________  EKG My review and personal interpretation at Time: 15:20   Indication: chest pain  Rate: 100  Rhythm: aflutter Axis: left Other: nonspecific st and t wave abn that is unchanged from previous EKG 01/19/18 ____________________________________________  RADIOLOGY  I personally reviewed all radiographic images ordered to evaluate for the above acute complaints and reviewed radiology reports and findings.  These findings were personally discussed with the patient.  Please see medical record for radiology report.  ____________________________________________   PROCEDURES  Procedure(s) performed:  Procedures    Critical Care performed: no ____________________________________________   INITIAL IMPRESSION / ASSESSMENT AND PLAN / ED COURSE  Pertinent labs & imaging results that were available during my care of the patient were reviewed by me and considered in my medical decision making (see chart for details).   DDX: UTI, urethritis, cystitis, CHF, COPD, ACS  Braddock Servellon is a 62 y.o. who presents to the ED with symptoms as described above.  Initial EKG appears unchanged from previous.  Troponin stable at his baseline.  Given his risk factors will order serial enzymes to ensure that it is stable or at least downtrending.  Patient has missed previous episodes at her visits to heart failure clinic.  Based on his presentation we will check urine.  Chest x-ray shows chronic bony vascular congestion.  No hypoxia or evidence of acute CHF  exacerbation.  Clinical Course as of Mar 04 2017  Thu Mar 04, 2018  1812 Urinalysis does show evidence of recurrent UTI.  Culture from previous urinalysis showed pansensitive E. coli.  Will give Keflex.     [PR]  2013 Repeat troponin downtrending.  No hypoxia.  Discussed need to double up on his Lasix dose for the next 2 to 3 days and follow-up with heart failure clinic.  Will be discharged on Keflex.   [PR]    Clinical Course User Index [PR] Willy Eddy, MD     As part of my medical decision making, I reviewed the following data within the electronic MEDICAL RECORD NUMBER Nursing notes reviewed and incorporated, Labs reviewed, notes from prior ED visits.   ____________________________________________   FINAL CLINICAL IMPRESSION(S) / ED DIAGNOSES  Final diagnoses:  Acute cystitis without hematuria  Chest pain, unspecified type  Chronic congestive heart failure, unspecified heart failure type (HCC)      NEW MEDICATIONS STARTED DURING THIS VISIT:  New Prescriptions   No medications on file     Note:  This document was prepared using Dragon voice recognition software and may include unintentional dictation errors.    Willy Eddy, MD 03/04/18 2019

## 2018-03-04 NOTE — ED Triage Notes (Deleted)
Pt in via [facility] w/ complaints of sudden onset right side flank pain, denies N/V, reports recent antibiotic therapy for UTI.  Vitals WDL, pt A/Ox4, NAD noted at this time.  Correction to previous note; pt denies chest pain now and prior to arrival, denies reporting chest pain with EMS.  Pt points to right abdomen, right flank when asked about pain.

## 2018-03-04 NOTE — ED Triage Notes (Signed)
Dr Sharma Covert notified of troponin 0.07. Consulting civil engineer also notified.

## 2018-03-22 ENCOUNTER — Telehealth: Payer: Self-pay | Admitting: Family

## 2018-03-22 ENCOUNTER — Ambulatory Visit: Payer: Non-veteran care | Admitting: Family

## 2018-03-22 NOTE — Telephone Encounter (Signed)
Patient did not show for his [facility] Failure [facility] appointment on 03/22/18. Will attempt to reschedule.   This is the 6th appointment that he has missed.

## 2018-06-13 ENCOUNTER — Emergency Department: Payer: Medicare Other

## 2018-06-13 ENCOUNTER — Inpatient Hospital Stay
Admission: EM | Admit: 2018-06-13 | Discharge: 2018-06-15 | DRG: 065 | Disposition: A | Discharge status: Home Health | Payer: Medicare Other | Attending: Internal Medicine | Admitting: Internal Medicine

## 2018-06-13 ENCOUNTER — Other Ambulatory Visit: Payer: Self-pay

## 2018-06-13 DIAGNOSIS — I6381 Other cerebral infarction due to occlusion or stenosis of small artery: Secondary | ICD-10-CM | POA: Diagnosis not present

## 2018-06-13 DIAGNOSIS — E119 Type 2 diabetes mellitus without complications: Secondary | ICD-10-CM

## 2018-06-13 DIAGNOSIS — E1151 Type 2 diabetes mellitus with diabetic peripheral angiopathy without gangrene: Secondary | ICD-10-CM | POA: Diagnosis present

## 2018-06-13 DIAGNOSIS — Z886 Allergy status to analgesic agent status: Secondary | ICD-10-CM

## 2018-06-13 DIAGNOSIS — Z23 Encounter for immunization: Secondary | ICD-10-CM

## 2018-06-13 DIAGNOSIS — I5042 Chronic combined systolic (congestive) and diastolic (congestive) heart failure: Secondary | ICD-10-CM | POA: Diagnosis present

## 2018-06-13 DIAGNOSIS — Z8249 Family history of ischemic heart disease and other diseases of the circulatory system: Secondary | ICD-10-CM

## 2018-06-13 DIAGNOSIS — R778 Other specified abnormalities of plasma proteins: Secondary | ICD-10-CM | POA: Diagnosis present

## 2018-06-13 DIAGNOSIS — I10 Essential (primary) hypertension: Secondary | ICD-10-CM | POA: Diagnosis present

## 2018-06-13 DIAGNOSIS — Z79899 Other long term (current) drug therapy: Secondary | ICD-10-CM

## 2018-06-13 DIAGNOSIS — R29810 Facial weakness: Secondary | ICD-10-CM | POA: Diagnosis present

## 2018-06-13 DIAGNOSIS — Z7984 Long term (current) use of oral hypoglycemic drugs: Secondary | ICD-10-CM

## 2018-06-13 DIAGNOSIS — I2510 Atherosclerotic heart disease of native coronary artery without angina pectoris: Secondary | ICD-10-CM | POA: Diagnosis present

## 2018-06-13 DIAGNOSIS — Z6830 Body mass index (BMI) 30.0-30.9, adult: Secondary | ICD-10-CM

## 2018-06-13 DIAGNOSIS — I110 Hypertensive heart disease with heart failure: Secondary | ICD-10-CM | POA: Diagnosis present

## 2018-06-13 DIAGNOSIS — R4781 Slurred speech: Secondary | ICD-10-CM | POA: Diagnosis present

## 2018-06-13 DIAGNOSIS — I639 Cerebral infarction, unspecified: Secondary | ICD-10-CM | POA: Diagnosis present

## 2018-06-13 DIAGNOSIS — M109 Gout, unspecified: Secondary | ICD-10-CM | POA: Diagnosis present

## 2018-06-13 DIAGNOSIS — Z7901 Long term (current) use of anticoagulants: Secondary | ICD-10-CM

## 2018-06-13 DIAGNOSIS — I160 Hypertensive urgency: Secondary | ICD-10-CM | POA: Diagnosis present

## 2018-06-13 DIAGNOSIS — I6522 Occlusion and stenosis of left carotid artery: Secondary | ICD-10-CM | POA: Diagnosis present

## 2018-06-13 DIAGNOSIS — R29702 NIHSS score 2: Secondary | ICD-10-CM | POA: Diagnosis present

## 2018-06-13 DIAGNOSIS — Z720 Tobacco use: Secondary | ICD-10-CM | POA: Diagnosis present

## 2018-06-13 DIAGNOSIS — Z9981 Dependence on supplemental oxygen: Secondary | ICD-10-CM

## 2018-06-13 DIAGNOSIS — J449 Chronic obstructive pulmonary disease, unspecified: Secondary | ICD-10-CM | POA: Diagnosis present

## 2018-06-13 DIAGNOSIS — R471 Dysarthria and anarthria: Secondary | ICD-10-CM | POA: Diagnosis present

## 2018-06-13 DIAGNOSIS — I482 Chronic atrial fibrillation, unspecified: Secondary | ICD-10-CM

## 2018-06-13 DIAGNOSIS — J9610 Chronic respiratory failure, unspecified whether with hypoxia or hypercapnia: Secondary | ICD-10-CM | POA: Diagnosis present

## 2018-06-13 DIAGNOSIS — Z955 Presence of coronary angioplasty implant and graft: Secondary | ICD-10-CM

## 2018-06-13 DIAGNOSIS — E785 Hyperlipidemia, unspecified: Secondary | ICD-10-CM | POA: Diagnosis present

## 2018-06-13 DIAGNOSIS — G459 Transient cerebral ischemic attack, unspecified: Secondary | ICD-10-CM | POA: Diagnosis present

## 2018-06-13 DIAGNOSIS — E669 Obesity, unspecified: Secondary | ICD-10-CM | POA: Diagnosis present

## 2018-06-13 DIAGNOSIS — G451 Carotid artery syndrome (hemispheric): Secondary | ICD-10-CM | POA: Diagnosis present

## 2018-06-13 DIAGNOSIS — I255 Ischemic cardiomyopathy: Secondary | ICD-10-CM | POA: Diagnosis present

## 2018-06-13 DIAGNOSIS — R7989 Other specified abnormal findings of blood chemistry: Secondary | ICD-10-CM | POA: Diagnosis present

## 2018-06-13 DIAGNOSIS — Z87891 Personal history of nicotine dependence: Secondary | ICD-10-CM

## 2018-06-13 DIAGNOSIS — I248 Other forms of acute ischemic heart disease: Secondary | ICD-10-CM | POA: Diagnosis present

## 2018-06-13 DIAGNOSIS — R4701 Aphasia: Secondary | ICD-10-CM | POA: Diagnosis present

## 2018-06-13 DIAGNOSIS — Z7951 Long term (current) use of inhaled steroids: Secondary | ICD-10-CM

## 2018-06-13 DIAGNOSIS — I6529 Occlusion and stenosis of unspecified carotid artery: Secondary | ICD-10-CM | POA: Diagnosis present

## 2018-06-13 LAB — TROPONIN I
Troponin I: 0.15 ng/mL (ref <0.03)
Troponin I: 0.16 ng/mL (ref <0.03)

## 2018-06-13 LAB — COMPREHENSIVE METABOLIC PANEL
ALK PHOS: 199 U/L — AB (ref 38–126)
ALT: 43 U/L (ref 0–44)
AST: 43 U/L — AB (ref 15–41)
Albumin: 3.8 g/dL (ref 3.5–5.0)
Anion gap: 13 (ref 5–15)
BILIRUBIN TOTAL: 1.4 mg/dL — AB (ref 0.3–1.2)
BUN: 20 mg/dL (ref 8–23)
CALCIUM: 9.2 mg/dL (ref 8.9–10.3)
CO2: 25 mmol/L (ref 22–32)
CREATININE: 1.08 mg/dL (ref 0.61–1.24)
Chloride: 102 mmol/L (ref 98–111)
GFR calc Af Amer: >60 mL/min (ref >60)
GFR calc non Af Amer: >60 mL/min (ref >60)
Glucose, Bld: 135 mg/dL — ABNORMAL HIGH (ref 70–99)
Potassium: 3.9 mmol/L (ref 3.5–5.1)
Sodium: 140 mmol/L (ref 135–145)
TOTAL PROTEIN: 7.5 g/dL (ref 6.5–8.1)

## 2018-06-13 LAB — CBC
HEMATOCRIT: 46.7 % (ref 39.0–52.0)
Hemoglobin: 14.6 g/dL (ref 13.0–17.0)
MCH: 30.2 pg (ref 26.0–34.0)
MCHC: 31.3 g/dL (ref 30.0–36.0)
MCV: 96.7 fL (ref 80.0–100.0)
Platelets: 228 10*3/uL (ref 150–400)
RBC: 4.83 MIL/uL (ref 4.22–5.81)
RDW: 15.1 % (ref 11.5–15.5)
WBC: 6.8 10*3/uL (ref 4.0–10.5)
nRBC: 0 % (ref 0.0–0.2)

## 2018-06-13 LAB — URINALYSIS, COMPLETE (UACMP) WITH MICROSCOPIC
Bacteria, UA: NONE SEEN
Bilirubin Urine: NEGATIVE
Glucose, UA: >=500 mg/dL — AB
Hgb urine dipstick: NEGATIVE
Ketones, ur: NEGATIVE mg/dL
Leukocytes, UA: NEGATIVE
Nitrite: NEGATIVE
PH: 5 (ref 5.0–8.0)
Protein, ur: NEGATIVE mg/dL
SPECIFIC GRAVITY, URINE: 1.033 — AB (ref 1.005–1.030)

## 2018-06-13 LAB — DIFFERENTIAL
ABS IMMATURE GRANULOCYTES: 0.02 10*3/uL (ref 0.00–0.07)
Basophils Absolute: 0.1 10*3/uL (ref 0.0–0.1)
Basophils Relative: 1 %
EOS PCT: 2 %
Eosinophils Absolute: 0.1 10*3/uL (ref 0.0–0.5)
Immature Granulocytes: 0 %
LYMPHS ABS: 1.8 10*3/uL (ref 0.7–4.0)
LYMPHS PCT: 26 %
Monocytes Absolute: 0.6 10*3/uL (ref 0.1–1.0)
Monocytes Relative: 9 %
Neutro Abs: 4.2 10*3/uL (ref 1.7–7.7)
Neutrophils Relative %: 62 %

## 2018-06-13 LAB — URINE DRUG SCREEN, QUALITATIVE (ARMC ONLY)
Amphetamines, Ur Screen: NOT DETECTED
Barbiturates, Ur Screen: NOT DETECTED
Benzodiazepine, Ur Scrn: NOT DETECTED
Cannabinoid 50 Ng, Ur ~~LOC~~: NOT DETECTED
Cocaine Metabolite,Ur ~~LOC~~: NOT DETECTED
MDMA (Ecstasy)Ur Screen: NOT DETECTED
Methadone Scn, Ur: NOT DETECTED
Opiate, Ur Screen: NOT DETECTED
Phencyclidine (PCP) Ur S: NOT DETECTED
Tricyclic, Ur Screen: NOT DETECTED

## 2018-06-13 LAB — PROTIME-INR
INR: 2.47
PROTHROMBIN TIME: 26.4 s — AB (ref 11.4–15.2)

## 2018-06-13 LAB — APTT: aPTT: 38 seconds — ABNORMAL HIGH (ref 24–36)

## 2018-06-13 MED ORDER — MOMETASONE FURO-FORMOTEROL FUM 200-5 MCG/ACT IN AERO
2.0000 | INHALATION_SPRAY | Freq: Two times a day (BID) | RESPIRATORY_TRACT | Status: DC
  Administered 2018-06-13 – 2018-06-15 (×4): 2 via RESPIRATORY_TRACT
  Filled 2018-06-13: qty 8.8

## 2018-06-13 MED ORDER — INSULIN ASPART 100 UNIT/ML ~~LOC~~ SOLN
0.0000 [IU] | Freq: Three times a day (TID) | SUBCUTANEOUS | Status: DC
  Administered 2018-06-14 (×2): 3 [IU] via SUBCUTANEOUS
  Administered 2018-06-15: 13:00:00 5 [IU] via SUBCUTANEOUS
  Administered 2018-06-15: 2 [IU] via SUBCUTANEOUS
  Filled 2018-06-13 (×4): qty 1

## 2018-06-13 MED ORDER — SPIRONOLACTONE 25 MG PO TABS
25.0000 mg | ORAL_TABLET | Freq: Every day | ORAL | Status: DC
  Administered 2018-06-14 – 2018-06-15 (×2): 25 mg via ORAL
  Filled 2018-06-13 (×2): qty 1

## 2018-06-13 MED ORDER — SENNOSIDES-DOCUSATE SODIUM 8.6-50 MG PO TABS: 1.0000 | ORAL_TABLET | Freq: Every evening | ORAL | Status: DC | PRN

## 2018-06-13 MED ORDER — IOPAMIDOL (ISOVUE-370) INJECTION 76%
75.0000 mL | Freq: Once | INTRAVENOUS | Status: AC | PRN
  Administered 2018-06-13: 75 mL via INTRAVENOUS

## 2018-06-13 MED ORDER — ATORVASTATIN CALCIUM 20 MG PO TABS
40.0000 mg | ORAL_TABLET | Freq: Every day | ORAL | Status: DC
  Administered 2018-06-13 – 2018-06-15 (×3): 40 mg via ORAL
  Filled 2018-06-13 (×3): qty 2

## 2018-06-13 MED ORDER — TORSEMIDE 20 MG PO TABS
40.0000 mg | ORAL_TABLET | Freq: Two times a day (BID) | ORAL | Status: DC
  Administered 2018-06-14: 40 mg via ORAL
  Filled 2018-06-13: qty 2

## 2018-06-13 MED ORDER — TRAZODONE HCL 50 MG PO TABS
50.0000 mg | ORAL_TABLET | Freq: Every day | ORAL | Status: DC
  Administered 2018-06-13 – 2018-06-14 (×2): 50 mg via ORAL
  Filled 2018-06-13 (×2): qty 1

## 2018-06-13 MED ORDER — CARVEDILOL 25 MG PO TABS
25.0000 mg | ORAL_TABLET | Freq: Two times a day (BID) | ORAL | Status: DC
  Administered 2018-06-14 – 2018-06-15 (×3): 25 mg via ORAL
  Filled 2018-06-13 (×3): qty 1

## 2018-06-13 MED ORDER — PNEUMOCOCCAL VAC POLYVALENT 25 MCG/0.5ML IJ INJ: 0.5000 mL | INJECTION | INTRAMUSCULAR | Status: DC

## 2018-06-13 MED ORDER — RIVAROXABAN 20 MG PO TABS
20.0000 mg | ORAL_TABLET | Freq: Every day | ORAL | Status: DC
  Administered 2018-06-14: 20 mg via ORAL
  Filled 2018-06-13 (×2): qty 1

## 2018-06-13 MED ORDER — POTASSIUM CHLORIDE CRYS ER 20 MEQ PO TBCR
20.0000 meq | EXTENDED_RELEASE_TABLET | Freq: Every day | ORAL | Status: DC
  Administered 2018-06-14 – 2018-06-15 (×2): 20 meq via ORAL
  Filled 2018-06-13 (×2): qty 1

## 2018-06-13 MED ORDER — INSULIN ASPART 100 UNIT/ML ~~LOC~~ SOLN
0.0000 [IU] | Freq: Every day | SUBCUTANEOUS | Status: DC
  Administered 2018-06-13 – 2018-06-14 (×2): 2 [IU] via SUBCUTANEOUS
  Filled 2018-06-13 (×2): qty 1

## 2018-06-13 MED ORDER — LOSARTAN POTASSIUM 50 MG PO TABS
50.0000 mg | ORAL_TABLET | Freq: Every day | ORAL | Status: DC
  Administered 2018-06-14: 50 mg via ORAL
  Filled 2018-06-13: qty 1

## 2018-06-13 MED ORDER — IPRATROPIUM-ALBUTEROL 0.5-2.5 (3) MG/3ML IN SOLN: 3.0000 mL | RESPIRATORY_TRACT | Status: DC | PRN

## 2018-06-13 MED ORDER — STROKE: EARLY STAGES OF RECOVERY BOOK
Freq: Once | Status: AC
  Administered 2018-06-13: 22:00:00

## 2018-06-13 MED ORDER — NITROGLYCERIN 0.4 MG SL SUBL
0.4000 mg | SUBLINGUAL_TABLET | SUBLINGUAL | Status: DC | PRN
  Administered 2018-06-15 (×2): 0.4 mg via SUBLINGUAL
  Filled 2018-06-13 (×2): qty 1

## 2018-06-13 NOTE — ED Notes (Addendum)
Pt able to use urinal on edge of bed w/ no assist  Pt wife given update w/ permission of pt

## 2018-06-13 NOTE — ED Notes (Signed)
Dr. Don PerkingVeronese made aware of troponin of 0.15. No new orders at this time

## 2018-06-13 NOTE — ED Triage Notes (Signed)
Pt from home via EMS. Wife called out d/t slurred speech starting approx 1217 this afternoon. NAD noted

## 2018-06-13 NOTE — ED Notes (Signed)
ED Provider at bedside. 

## 2018-06-13 NOTE — ED Notes (Signed)
ED TO INPATIENT HANDOFF REPORT  Name/Age/Gender [name] 62 y.o. male  Code Status Code Status History    Date Active Date Inactive Code Status Order ID Comments User Context   01/19/2018 2358 01/21/2018 1343 Full Code [id number]  Cammy Copa, MD ED   03/26/2017 0636 03/27/2017 1729 Full Code [id number]  Arnaldo Natal, MD Inpatient   02/18/2017 2112 02/19/2017 1707 Full Code [id number]  Houston Siren, MD Inpatient   01/05/2017 2246 01/07/2017 1614 Full Code [id number]  Altamese Dilling, MD Inpatient   07/24/2016 0235 07/31/2016 1401 Full Code [id number]  Oralia Manis, MD Inpatient    Advance Directive Documentation     Most Recent Value  Type of Advance Directive  Healthcare Power of Attorney, Living will  Pre-existing out of facility DNR order (yellow form or pink MOST form)  -  "MOST" Form in Place?  -      Home/SNF/Other Home  Chief Complaint stroke  Level of Care/Admitting Diagnosis ED Disposition    ED Disposition Condition Comment   Admit  Hospital Area: [facility] [[id number]]  Level of Care: Med-Surg [16]  Diagnosis: TIA (transient ischemic attack) [244010]  Admitting Physician: Gery Pray [[id number]]  Attending Physician: Gery Pray [[id number]]  PT Class (Do Not Modify): Observation [104]  PT Acc Code (Do Not Modify): Observation [10022]       Medical History Past Medical History:  Diagnosis Date  . Atrial fibrillation (HCC)   . CAD in native artery    a. stress echo 12/2007 abnl, EF > 55%, b. LHC 01/28/08: mLAD 30, D1 40, dLCx 70, pRCA 30, mRCA 70, mRCA lesion 2 80, PDA 90, s/p PCI/BMS to prox and distal RCA, s/p PCI/BMS to PDA; c. patient reports PCI/stenting x 2 in early 2017 at the [place] (no records on file) d. 06/2016: cath showing patent stents along RCA and LCx with moderate 40% stenosis along the LAD.   [name] Chronic combined systolic (congestive) and diastolic (congestive) heart failure (HCC)    a. echo 2009: > 55% b. 06/2016: EF 25-30%  by echo in 06/2016, cath showing patent stents with moderate disease along LAD  . COPD (chronic obstructive pulmonary disease) (HCC)   . Diabetes mellitus without complication (HCC)   . Gout   . Hyperlipidemia   . Hypertension   . Hypertensive heart disease   . Obesity   . Tobacco abuse     Allergies Allergies  Allergen Reactions  . Aspirin Other (See Comments)    Told not to take because of blood thinner  . Ibuprofen Nausea And Vomiting  . Tylenol [Acetaminophen] Nausea And Vomiting    IV Location/Drains/Wounds Patient Lines/Drains/Airways Status   Active Line/Drains/Airways    Name:   Placement date:   Placement time:   Site:   Days:   Peripheral IV 06/13/18 Right Forearm   06/13/18    1248    Forearm   less than 1          Labs/Imaging Results for orders placed or performed during the hospital encounter of 06/13/18 (from the past 48 hour(s))  Protime-INR     Status: Abnormal   Collection Time: 06/13/18  1:08 PM  Result Value Ref Range   Prothrombin Time 26.4 (H) 11.4 - 15.2 seconds   INR 2.47     Comment: Performed at [facility], [address] [place], [place] [address]  APTT     Status: Abnormal   Collection Time: 06/13/18  1:08 PM  Result  Value Ref Range   aPTT 38 (H) 24 - 36 seconds    Comment:        IF BASELINE aPTT IS ELEVATED, SUGGEST PATIENT RISK ASSESSMENT BE USED TO DETERMINE APPROPRIATE ANTICOAGULANT THERAPY. Performed at [facility], [address] [place], [place] [address]   CBC     Status: None   Collection Time: 06/13/18  1:08 PM  Result Value Ref Range   WBC 6.8 4.0 - 10.5 K/uL   RBC 4.83 4.22 - 5.81 MIL/uL   Hemoglobin 14.6 13.0 - 17.0 g/dL   HCT 19.1 47.8 - 29.5 %   MCV 96.7 80.0 - 100.0 fL   MCH 30.2 26.0 - 34.0 pg   MCHC 31.3 30.0 - 36.0 g/dL   RDW 62.1 30.8 - 65.7 %   Platelets 228 150 - 400 K/uL   nRBC 0.0 0.0 - 0.2 %    Comment: Performed at [facility], [address] [place], [place]  [address]  Differential     Status: None   Collection Time: 06/13/18  1:08 PM  Result Value Ref Range   Neutrophils Relative % 62 %   Neutro Abs 4.2 1.7 - 7.7 K/uL   Lymphocytes Relative 26 %   Lymphs Abs 1.8 0.7 - 4.0 K/uL   Monocytes Relative 9 %   Monocytes Absolute 0.6 0.1 - 1.0 K/uL   Eosinophils Relative 2 %   Eosinophils Absolute 0.1 0.0 - 0.5 K/uL   Basophils Relative 1 %   Basophils Absolute 0.1 0.0 - 0.1 K/uL   Immature Granulocytes 0 %   Abs Immature Granulocytes 0.02 0.00 - 0.07 K/uL    Comment: Performed at [facility], [address] [place], [place] [address]  Comprehensive metabolic panel     Status: Abnormal   Collection Time: 06/13/18  1:08 PM  Result Value Ref Range   Sodium 140 135 - 145 mmol/L   Potassium 3.9 3.5 - 5.1 mmol/L   Chloride 102 98 - 111 mmol/L   CO2 25 22 - 32 mmol/L   Glucose, Bld 135 (H) 70 - 99 mg/dL   BUN 20 8 - 23 mg/dL   Creatinine, Ser 4.13 0.61 - 1.24 mg/dL   Calcium 9.2 8.9 - 24.4 mg/dL   Total Protein 7.5 6.5 - 8.1 g/dL   Albumin 3.8 3.5 - 5.0 g/dL   AST 43 (H) 15 - 41 U/L   ALT 43 0 - 44 U/L   Alkaline Phosphatase 199 (H) 38 - 126 U/L   Total Bilirubin 1.4 (H) 0.3 - 1.2 mg/dL   GFR calc non Af Amer >60 >60 mL/min   GFR calc Af Amer >60 >60 mL/min   Anion gap 13 5 - 15    Comment: Performed at [facility], [address] [place], [place] [address]  Troponin I - Once     Status: Abnormal   Collection Time: 06/13/18  1:08 PM  Result Value Ref Range   Troponin I 0.15 (HH) <0.03 ng/mL    Comment: CRITICAL RESULT CALLED TO, READ BACK BY AND VERIFIED WITH PAIGE JOHNSON  AT 1344 ON 06/13/18 BY SNJ. Performed at [facility], [address] [place], [place] [address]   Urinalysis, Complete w Microscopic     Status: Abnormal   Collection Time: 06/13/18  4:23 PM  Result Value Ref Range   Color, Urine YELLOW (A) YELLOW   APPearance CLEAR (A) CLEAR   Specific Gravity, Urine 1.033 (H) 1.005 -  1.030   pH  5.0 5.0 - 8.0   Glucose, UA >=500 (A) NEGATIVE mg/dL   Hgb urine dipstick NEGATIVE NEGATIVE   Bilirubin Urine NEGATIVE NEGATIVE   Ketones, ur NEGATIVE NEGATIVE mg/dL   Protein, ur NEGATIVE NEGATIVE mg/dL   Nitrite NEGATIVE NEGATIVE   Leukocytes, UA NEGATIVE NEGATIVE   RBC / HPF 0-5 0 - 5 RBC/hpf   WBC, UA 0-5 0 - 5 WBC/hpf   Bacteria, UA NONE SEEN NONE SEEN   Squamous Epithelial / LPF 0-5 0 - 5    Comment: Performed at [facility], [address] [place], [place] [address]  Urine Drug Screen, Qualitative ([facility] only)     Status: None   Collection Time: 06/13/18  4:23 PM  Result Value Ref Range   Tricyclic, Ur Screen NONE DETECTED NONE DETECTED   Amphetamines, Ur Screen NONE DETECTED NONE DETECTED   MDMA (Ecstasy)Ur Screen NONE DETECTED NONE DETECTED   Cocaine Metabolite,Ur [place] NONE DETECTED NONE DETECTED   Opiate, Ur Screen NONE DETECTED NONE DETECTED   Phencyclidine (PCP) Ur S NONE DETECTED NONE DETECTED   Cannabinoid 50 Ng, Ur [place] NONE DETECTED NONE DETECTED   Barbiturates, Ur Screen NONE DETECTED NONE DETECTED   Benzodiazepine, Ur Scrn NONE DETECTED NONE DETECTED   Methadone Scn, Ur NONE DETECTED NONE DETECTED    Comment: (NOTE) Tricyclics + metabolites, urine    Cutoff 1000 ng/mL Amphetamines + metabolites, urine  Cutoff 1000 ng/mL MDMA (Ecstasy), urine              Cutoff 500 ng/mL Cocaine Metabolite, urine          Cutoff 300 ng/mL Opiate + metabolites, urine        Cutoff 300 ng/mL Phencyclidine (PCP), urine         Cutoff 25 ng/mL Cannabinoid, urine                 Cutoff 50 ng/mL Barbiturates + metabolites, urine  Cutoff 200 ng/mL Benzodiazepine, urine              Cutoff 200 ng/mL Methadone, urine                   Cutoff 300 ng/mL The urine drug screen provides only a preliminary, unconfirmed analytical test result and should not be used for non-medical purposes. Clinical consideration and professional judgment should be applied to any positive drug screen  result due to possible interfering substances. A more specific alternate chemical method must be used in order to obtain a confirmed analytical result. Gas chromatography / mass spectrometry (GC/MS) is the preferred confirmat ory method. Performed at [facility], [address] [place], [place] [address]    Ct Angio Head W Or Wo Contrast  Result Date: 06/13/2018 CLINICAL DATA:  Acute onset dysarthria earlier today. LEFT facial droop. Atrial fibrillation, on Xarelto. EXAM: CT ANGIOGRAPHY HEAD AND NECK TECHNIQUE: Multidetector CT imaging of the head and neck was performed using the standard protocol during bolus administration of intravenous contrast. Multiplanar CT image reconstructions and MIPs were obtained to evaluate the vascular anatomy. Carotid stenosis measurements (when applicable) are obtained utilizing NASCET criteria, using the distal internal carotid diameter as the denominator. CONTRAST:  75mL ISOVUE-370 IOPAMIDOL (ISOVUE-370) INJECTION 76% COMPARISON:  Code stroke CT earlier today was negative. FINDINGS: CTA NECK FINDINGS Aortic arch: Standard branching. Imaged portion shows no evidence of aneurysm or dissection. No significant stenosis of the major arch vessel origins. Aortic atherosclerosis. Right carotid system: Moderate  soft plaque distal CCA, nonstenotic. Moderate to severe calcified and noncalcified plaque at the bifurcation extending into the proximal RIGHT ICA. Estimated 60% ICA stenosis based on luminal measurements of 1.6/4.1 proximal/distal. Cervical ICA patent. No dissection. Left carotid system: Minor soft plaque in the distal common carotid artery, nonstenotic. Severe calcified and noncalcified plaque at the bifurcation, extending into the proximal ICA. Estimated flow-limiting 80-90% stenosis based on luminal measurements of 0.9/4.5 proximal/distal. Cervical ICA patent. No dissection. Vertebral arteries: Nonstenotic ostial plaque at both vertebral origins.  Nonstenotic calcification of both proximal vertebral arteries which are patent, RIGHT slightly larger. Skeleton: Advanced cervical spondylosis, disc space narrowing most pronounced C4-5, C5-6, and C6-7. Poor dentition, numerous missing teeth, dental caries and periapical lucencies. Other neck: Unremarkable. Upper chest: No mass, pneumothorax, or effusion. Review of the MIP images confirms the above findings CTA HEAD FINDINGS Anterior circulation: Heavily calcified carotid siphons bilaterally, estimated 50-75% stenoses in the BILATERAL distal cavernous segments. ICA termini patent. No flow-limiting stenosis of the anterior or middle cerebral arteries. No MCA branch occlusion, although mild irregularity of the distal MCA branches is observed bilaterally. Posterior circulation: Dominant RIGHT vertebral supplies the basilar. Calcified stenosis of the LEFT vertebral estimated 50-75% in its V4 segment. Basilar artery is mildly irregular, and mildly hypoplastic due to fetal LEFT PCA. No flow-limiting PCA stenosis or cerebellar branch occlusion although the LEFT PCA appears slightly more irregular than the RIGHT. Venous sinuses: As permitted by contrast timing, patent. Anatomic variants: Fetal LEFT PCA. Delayed phase: Not performed. Review of the MIP images confirms the above findings IMPRESSION: 1. Severe calcified and noncalcified plaque at the LEFT carotid bifurcation, extending into the proximal ICA. Estimated 80-90% stenosis. 2. Non stenotic atheromatous change RIGHT carotid bifurcation, estimated 60% stenosis. 3. No intracranial flow-limiting stenosis or large vessel occlusion. Significant calcified plaque both distal cavernous carotid arteries, estimated 50-75% stenosis bilaterally. 4. Poor dentition, with numerous missing teeth, dental caries, and periapical lucencies. Electronically Signed   By: Elsie Stain M.D.   On: 06/13/2018 14:44   Ct Angio Neck W Or Wo Contrast  Result Date: 06/13/2018 CLINICAL DATA:   Acute onset dysarthria earlier today. LEFT facial droop. Atrial fibrillation, on Xarelto. EXAM: CT ANGIOGRAPHY HEAD AND NECK TECHNIQUE: Multidetector CT imaging of the head and neck was performed using the standard protocol during bolus administration of intravenous contrast. Multiplanar CT image reconstructions and MIPs were obtained to evaluate the vascular anatomy. Carotid stenosis measurements (when applicable) are obtained utilizing NASCET criteria, using the distal internal carotid diameter as the denominator. CONTRAST:  75mL ISOVUE-370 IOPAMIDOL (ISOVUE-370) INJECTION 76% COMPARISON:  Code stroke CT earlier today was negative. FINDINGS: CTA NECK FINDINGS Aortic arch: Standard branching. Imaged portion shows no evidence of aneurysm or dissection. No significant stenosis of the major arch vessel origins. Aortic atherosclerosis. Right carotid system: Moderate soft plaque distal CCA, nonstenotic. Moderate to severe calcified and noncalcified plaque at the bifurcation extending into the proximal RIGHT ICA. Estimated 60% ICA stenosis based on luminal measurements of 1.6/4.1 proximal/distal. Cervical ICA patent. No dissection. Left carotid system: Minor soft plaque in the distal common carotid artery, nonstenotic. Severe calcified and noncalcified plaque at the bifurcation, extending into the proximal ICA. Estimated flow-limiting 80-90% stenosis based on luminal measurements of 0.9/4.5 proximal/distal. Cervical ICA patent. No dissection. Vertebral arteries: Nonstenotic ostial plaque at both vertebral origins. Nonstenotic calcification of both proximal vertebral arteries which are patent, RIGHT slightly larger. Skeleton: Advanced cervical spondylosis, disc space narrowing most pronounced C4-5, C5-6, and C6-7. Poor dentition,  numerous missing teeth, dental caries and periapical lucencies. Other neck: Unremarkable. Upper chest: No mass, pneumothorax, or effusion. Review of the MIP images confirms the above findings CTA  HEAD FINDINGS Anterior circulation: Heavily calcified carotid siphons bilaterally, estimated 50-75% stenoses in the BILATERAL distal cavernous segments. ICA termini patent. No flow-limiting stenosis of the anterior or middle cerebral arteries. No MCA branch occlusion, although mild irregularity of the distal MCA branches is observed bilaterally. Posterior circulation: Dominant RIGHT vertebral supplies the basilar. Calcified stenosis of the LEFT vertebral estimated 50-75% in its V4 segment. Basilar artery is mildly irregular, and mildly hypoplastic due to fetal LEFT PCA. No flow-limiting PCA stenosis or cerebellar branch occlusion although the LEFT PCA appears slightly more irregular than the RIGHT. Venous sinuses: As permitted by contrast timing, patent. Anatomic variants: Fetal LEFT PCA. Delayed phase: Not performed. Review of the MIP images confirms the above findings IMPRESSION: 1. Severe calcified and noncalcified plaque at the LEFT carotid bifurcation, extending into the proximal ICA. Estimated 80-90% stenosis. 2. Non stenotic atheromatous change RIGHT carotid bifurcation, estimated 60% stenosis. 3. No intracranial flow-limiting stenosis or large vessel occlusion. Significant calcified plaque both distal cavernous carotid arteries, estimated 50-75% stenosis bilaterally. 4. Poor dentition, with numerous missing teeth, dental caries, and periapical lucencies. Electronically Signed   By: Elsie Stain M.D.   On: 06/13/2018 14:44   Ct Head Code Stroke Wo Contrast  Addendum Date: 06/13/2018   ADDENDUM REPORT: 06/13/2018 13:21 ADDENDUM: Study discussed by telephone with Dr. Nita Sickle on 06/13/2018 at 1312 hours. Electronically Signed   By: Odessa Fleming M.D.   On: 06/13/2018 13:21   Result Date: 06/13/2018 CLINICAL DATA:  Code stroke. 62 year old male with slurred speech onset at 1217 hours. EXAM: CT HEAD WITHOUT CONTRAST TECHNIQUE: Contiguous axial images were obtained from the base of the skull through  the vertex without intravenous contrast. COMPARISON:  None. FINDINGS: Brain: No midline shift, mass effect, or evidence of intracranial mass lesion. No acute intracranial hemorrhage identified. No ventriculomegaly. Gray-white matter differentiation is within normal limits for age. No cortically based acute infarct identified. Vascular: Calcified atherosclerosis at the skull base. No suspicious intracranial vascular hyperdensity. Skull: Negative. Sinuses/Orbits: Visualized paranasal sinuses and mastoids are well pneumatized. Other: Visualized orbit soft tissues are within normal limits. Negative scalp soft tissues. ASPECTS [facility] Stroke Program Early CT Score) - Ganglionic level infarction (caudate, lentiform nuclei, internal capsule, insula, M1-M3 cortex): 7 - Supraganglionic infarction (M4-M6 cortex): 3 Total score (0-10 with 10 being normal): 10 IMPRESSION: 1. Normal for age noncontrast CT appearance of the brain. 2. ASPECTS 10. Electronically Signed: By: Odessa Fleming M.D. On: 06/13/2018 13:09    Pending Labs [employer] (From admission, onward)    Start     Ordered   Signed and Held  Hemoglobin A1c  Tomorrow morning,   R     Signed and Held   Signed and Held  Lipid panel  Tomorrow morning,   R    Comments:  Fasting    Signed and Held   Signed and Held  Troponin I - Now Then Q3H  Now then every 3 hours,   R     Signed and Held          Vitals/Pain Today's Vitals   06/13/18 1702 06/13/18 1722 06/13/18 1800 06/13/18 1900  BP:   138/84 (!) 149/101  Pulse: 90 91 93 (!) 54  Resp:  17 15 20   Temp:      TempSrc:  SpO2: 100% 100% 100% 100%  Weight:      Height:      PainSc:        Isolation Precautions No active isolations  Medications Medications  iopamidol (ISOVUE-370) 76 % injection 75 mL (75 mLs Intravenous Contrast Given 06/13/18 1329)    Mobility independent

## 2018-06-13 NOTE — ED Notes (Signed)
No response yet received re: diet order from attending MD. MD paged by ED secretary.

## 2018-06-13 NOTE — ED Notes (Signed)
Message sent to MD re: diet order as pt passed swallow screen. Awaiting response.

## 2018-06-13 NOTE — H&P (Signed)
PCP:   Administration, [employer] Complaint: slurred speech   HPI: this is a 62 y/o male who presents with c/o slurred speech and facial droop noted mainly around the mouth. Sudden onset around 12PM. He denies any HA or confusions. He denies any localized weakness. He denies being ill prior, no nausea, vomiting ir diarrhea. It lasted approx 1 hour, then self extinguished. He has CAD and a h/o Atrial fibrillation.   Review of Systems:  The patient denies anorexia, fever, weight loss, slurred speech, vision loss, decreased hearing, hoarseness, chest pain, syncope, dyspnea on exertion, peripheral edema, balance deficits, hemoptysis, abdominal pain, melena, hematochezia, severe indigestion/heartburn, hematuria, incontinence, genital sores, muscle weakness, suspicious skin lesions, transient blindness, difficulty walking, depression, unusual weight change, abnormal bleeding, enlarged lymph nodes, angioedema, and breast masses.  Past Medical History: Past Medical History:  Diagnosis Date  . Atrial fibrillation (HCC)   . CAD in native artery    a. stress echo 12/2007 abnl, EF > 55%, b. LHC 01/28/08: mLAD 30, D1 40, dLCx 70, pRCA 30, mRCA 70, mRCA lesion 2 80, PDA 90, s/p PCI/BMS to prox and distal RCA, s/p PCI/BMS to PDA; c. patient reports PCI/stenting x 2 in early 2017 at the [place] (no records on file) d. 06/2016: cath showing patent stents along RCA and LCx with moderate 40% stenosis along the LAD.   [name] Chronic combined systolic (congestive) and diastolic (congestive) heart failure (HCC)    a. echo 2009: > 55% b. 06/2016: EF 25-30% by echo in 06/2016, cath showing patent stents with moderate disease along LAD  . COPD (chronic obstructive pulmonary disease) (HCC)   . Diabetes mellitus without complication (HCC)   . Gout   . Hyperlipidemia   . Hypertension   . Hypertensive heart disease   . Obesity   . Tobacco abuse    Past Surgical History:  Procedure Laterality Date  . CARDIAC  CATHETERIZATION  2009   [facility];   . CARDIAC CATHETERIZATION  2010  . CARDIAC CATHETERIZATION N/A 07/28/2016   Procedure: Right and Left Heart Cath and possible PCI;  Surgeon: Iran Ouch, MD;  Location: [facility] INVASIVE CV LAB;  Service: Cardiovascular;  Laterality: N/A;  . CARDIOVERSION N/A 03/27/2017   Procedure: CARDIOVERSION;  Surgeon: Iran Ouch, MD;  Location: [facility] ORS;  Service: Cardiovascular;  Laterality: N/A;  . CORONARY ANGIOPLASTY  2009   s/p stent placement at [facility].    Medications: Prior to Admission medications   Medication Sig Start Date End Date Taking? Authorizing Provider  albuterol-ipratropium (COMBIVENT) 18-103 MCG/ACT inhaler Inhale 2 puffs into the lungs every 4 (four) hours as needed for wheezing.   Yes [provider]  atorvastatin (LIPITOR) 40 MG tablet Take 40 mg by mouth daily.    Yes [provider]  budesonide-formoterol (SYMBICORT) 160-4.5 MCG/ACT inhaler Inhale 2 puffs into the lungs 2 (two) times daily.   Yes [provider]  carvedilol (COREG) 25 MG tablet Take 1 tablet (25 mg total) by mouth 2 (two) times daily with a meal. 07/31/16  Yes Enedina Finner, MD  docusate sodium (COLACE) 100 MG capsule Take 100 mg by mouth 2 (two) times daily.   Yes [provider]  metFORMIN (GLUCOPHAGE) 1000 MG tablet Take 1,000 mg by mouth 2 (two) times daily with a meal.    Yes [provider]  methocarbamol (ROBAXIN) 500 MG tablet Take 1 tablet (500 mg total) by mouth 4 (four) times daily. 12/17/17  Yes Tommi Rumps, PA-C  nitroGLYCERIN (NITROSTAT) 0.4 MG SL tablet Place 0.4 mg under the tongue every 5 (five) minutes as needed for chest pain.   Yes [provider]  potassium chloride SA (K-DUR,KLOR-CON) 20 MEQ tablet Take 1 tablet (20 mEq total) by mouth daily. 01/22/18  Yes Shaune Pollack, MD  rivaroxaban (XARELTO) 20 MG TABS tablet Take 1 tablet (20 mg total) by mouth daily with supper. 02/19/17  Yes Adrian Saran, MD   spironolactone (ALDACTONE) 25 MG tablet Take 1 tablet (25 mg total) by mouth daily. 08/01/16  Yes Enedina Finner, MD  torsemide (DEMADEX) 20 MG tablet Take 2 tablets (40 mg total) by mouth 2 (two) times daily. 01/21/18  Yes Shaune Pollack, MD  traZODone (DESYREL) 50 MG tablet Take 50 mg by mouth at bedtime.   Yes [provider]  cephALEXin (KEFLEX) 500 MG capsule Take 1 capsule (500 mg total) by mouth every 12 (twelve) hours. Patient not taking: Reported on 06/13/2018 01/22/18   Shaune Pollack, MD  losartan (COZAAR) 25 MG tablet Take 2 tablets (50 mg total) by mouth daily. Patient not taking: Reported on 06/13/2018 01/21/18   Shaune Pollack, MD  oxyCODONE (OXY IR/ROXICODONE) 5 MG immediate release tablet Take 1 tablet (5 mg total) by mouth every 6 (six) hours as needed for severe pain. Patient not taking: Reported on 06/13/2018 12/17/17   Tommi Rumps, PA-C    Allergies:   Allergies  Allergen Reactions  . Aspirin Other (See Comments)    Told not to take because of blood thinner  . Ibuprofen Nausea And Vomiting  . Tylenol [Acetaminophen] Nausea And Vomiting    Social History:  reports that he has quit smoking. His smoking use included cigarettes. He has a 35.00 pack-year smoking history. He has never used smokeless tobacco. He reports previous alcohol use. He reports that he does not use drugs.  Family History: Family History  Problem Relation Age of Onset  . Heart attack Mother 5    Physical Exam: Vitals:   06/13/18 1350 06/13/18 1400 06/13/18 1448 06/13/18 1450  BP: 109/80 121/77  (!) 147/106  Pulse: 91 98 60 (!) 45  Resp: 15 (!) 25 (!) 22 19  Temp:      TempSrc:      SpO2: 97% 100% 99% 100%  Weight:      Height:        General:  Alert and oriented times three, well developed and nourished, no acute distress Eyes: PERRLA, pink conjunctiva, no scleral icterus ENT: Moist oral mucosa, neck supple, no thyromegaly Lungs: clear to ascultation, no wheeze, no crackles, no use of  accessory muscles Cardiovascular: irregular rate and rhythm, no regurgitation, no gallops, no murmurs. No carotid bruits, no JVD Abdomen: soft, positive BS, non-tender, non-distended, no organomegaly, not an acute abdomen GU: not examined Neuro: CN II - XII grossly intact, sensation intact Musculoskeletal: strength 5/5 all extremities, no clubbing, cyanosis or trace edema Skin: no rash, no subcutaneous crepitation, no decubitus Psych: appropriate patient   Labs on Admission:  Recent Labs    06/13/18 1308  NA 140  K 3.9  CL 102  CO2 25  GLUCOSE 135*  BUN 20  CREATININE 1.08  CALCIUM 9.2   Recent Labs    06/13/18 1308  AST 43*  ALT 43  ALKPHOS 199*  BILITOT 1.4*  PROT 7.5  ALBUMIN 3.8   No results for input(s): LIPASE, AMYLASE in the last 72 hours. Recent Labs    06/13/18 1308  WBC 6.8  NEUTROABS  4.2  HGB 14.6  HCT 46.7  MCV 96.7  PLT 228   Recent Labs    06/13/18 1308  TROPONINI 0.15*   Invalid input(s): POCBNP No results for input(s): DDIMER in the last 72 hours. No results for input(s): HGBA1C in the last 72 hours. No results for input(s): CHOL, HDL, LDLCALC, TRIG, CHOLHDL, LDLDIRECT in the last 72 hours. No results for input(s): TSH, T4TOTAL, T3FREE, THYROIDAB in the last 72 hours.  Invalid input(s): FREET3 No results for input(s): VITAMINB12, FOLATE, FERRITIN, TIBC, IRON, RETICCTPCT in the last 72 hours.  Micro Results: No results found for this or any previous visit (from the past 240 hour(s)).   Radiological Exams on Admission: Ct Angio Head W Or Wo Contrast  Result Date: 06/13/2018 CLINICAL DATA:  Acute onset dysarthria earlier today. LEFT facial droop. Atrial fibrillation, on Xarelto. EXAM: CT ANGIOGRAPHY HEAD AND NECK TECHNIQUE: Multidetector CT imaging of the head and neck was performed using the standard protocol during bolus administration of intravenous contrast. Multiplanar CT image reconstructions and MIPs were obtained to evaluate the  vascular anatomy. Carotid stenosis measurements (when applicable) are obtained utilizing NASCET criteria, using the distal internal carotid diameter as the denominator. CONTRAST:  75mL ISOVUE-370 IOPAMIDOL (ISOVUE-370) INJECTION 76% COMPARISON:  Code stroke CT earlier today was negative. FINDINGS: CTA NECK FINDINGS Aortic arch: Standard branching. Imaged portion shows no evidence of aneurysm or dissection. No significant stenosis of the major arch vessel origins. Aortic atherosclerosis. Right carotid system: Moderate soft plaque distal CCA, nonstenotic. Moderate to severe calcified and noncalcified plaque at the bifurcation extending into the proximal RIGHT ICA. Estimated 60% ICA stenosis based on luminal measurements of 1.6/4.1 proximal/distal. Cervical ICA patent. No dissection. Left carotid system: Minor soft plaque in the distal common carotid artery, nonstenotic. Severe calcified and noncalcified plaque at the bifurcation, extending into the proximal ICA. Estimated flow-limiting 80-90% stenosis based on luminal measurements of 0.9/4.5 proximal/distal. Cervical ICA patent. No dissection. Vertebral arteries: Nonstenotic ostial plaque at both vertebral origins. Nonstenotic calcification of both proximal vertebral arteries which are patent, RIGHT slightly larger. Skeleton: Advanced cervical spondylosis, disc space narrowing most pronounced C4-5, C5-6, and C6-7. Poor dentition, numerous missing teeth, dental caries and periapical lucencies. Other neck: Unremarkable. Upper chest: No mass, pneumothorax, or effusion. Review of the MIP images confirms the above findings CTA HEAD FINDINGS Anterior circulation: Heavily calcified carotid siphons bilaterally, estimated 50-75% stenoses in the BILATERAL distal cavernous segments. ICA termini patent. No flow-limiting stenosis of the anterior or middle cerebral arteries. No MCA branch occlusion, although mild irregularity of the distal MCA branches is observed bilaterally.  Posterior circulation: Dominant RIGHT vertebral supplies the basilar. Calcified stenosis of the LEFT vertebral estimated 50-75% in its V4 segment. Basilar artery is mildly irregular, and mildly hypoplastic due to fetal LEFT PCA. No flow-limiting PCA stenosis or cerebellar branch occlusion although the LEFT PCA appears slightly more irregular than the RIGHT. Venous sinuses: As permitted by contrast timing, patent. Anatomic variants: Fetal LEFT PCA. Delayed phase: Not performed. Review of the MIP images confirms the above findings IMPRESSION: 1. Severe calcified and noncalcified plaque at the LEFT carotid bifurcation, extending into the proximal ICA. Estimated 80-90% stenosis. 2. Non stenotic atheromatous change RIGHT carotid bifurcation, estimated 60% stenosis. 3. No intracranial flow-limiting stenosis or large vessel occlusion. Significant calcified plaque both distal cavernous carotid arteries, estimated 50-75% stenosis bilaterally. 4. Poor dentition, with numerous missing teeth, dental caries, and periapical lucencies. Electronically Signed   By: Elsie Stain M.D.   On:  06/13/2018 14:44   Ct Angio Neck W Or Wo Contrast  Result Date: 06/13/2018 CLINICAL DATA:  Acute onset dysarthria earlier today. LEFT facial droop. Atrial fibrillation, on Xarelto. EXAM: CT ANGIOGRAPHY HEAD AND NECK TECHNIQUE: Multidetector CT imaging of the head and neck was performed using the standard protocol during bolus administration of intravenous contrast. Multiplanar CT image reconstructions and MIPs were obtained to evaluate the vascular anatomy. Carotid stenosis measurements (when applicable) are obtained utilizing NASCET criteria, using the distal internal carotid diameter as the denominator. CONTRAST:  75mL ISOVUE-370 IOPAMIDOL (ISOVUE-370) INJECTION 76% COMPARISON:  Code stroke CT earlier today was negative. FINDINGS: CTA NECK FINDINGS Aortic arch: Standard branching. Imaged portion shows no evidence of aneurysm or dissection.  No significant stenosis of the major arch vessel origins. Aortic atherosclerosis. Right carotid system: Moderate soft plaque distal CCA, nonstenotic. Moderate to severe calcified and noncalcified plaque at the bifurcation extending into the proximal RIGHT ICA. Estimated 60% ICA stenosis based on luminal measurements of 1.6/4.1 proximal/distal. Cervical ICA patent. No dissection. Left carotid system: Minor soft plaque in the distal common carotid artery, nonstenotic. Severe calcified and noncalcified plaque at the bifurcation, extending into the proximal ICA. Estimated flow-limiting 80-90% stenosis based on luminal measurements of 0.9/4.5 proximal/distal. Cervical ICA patent. No dissection. Vertebral arteries: Nonstenotic ostial plaque at both vertebral origins. Nonstenotic calcification of both proximal vertebral arteries which are patent, RIGHT slightly larger. Skeleton: Advanced cervical spondylosis, disc space narrowing most pronounced C4-5, C5-6, and C6-7. Poor dentition, numerous missing teeth, dental caries and periapical lucencies. Other neck: Unremarkable. Upper chest: No mass, pneumothorax, or effusion. Review of the MIP images confirms the above findings CTA HEAD FINDINGS Anterior circulation: Heavily calcified carotid siphons bilaterally, estimated 50-75% stenoses in the BILATERAL distal cavernous segments. ICA termini patent. No flow-limiting stenosis of the anterior or middle cerebral arteries. No MCA branch occlusion, although mild irregularity of the distal MCA branches is observed bilaterally. Posterior circulation: Dominant RIGHT vertebral supplies the basilar. Calcified stenosis of the LEFT vertebral estimated 50-75% in its V4 segment. Basilar artery is mildly irregular, and mildly hypoplastic due to fetal LEFT PCA. No flow-limiting PCA stenosis or cerebellar branch occlusion although the LEFT PCA appears slightly more irregular than the RIGHT. Venous sinuses: As permitted by contrast timing,  patent. Anatomic variants: Fetal LEFT PCA. Delayed phase: Not performed. Review of the MIP images confirms the above findings IMPRESSION: 1. Severe calcified and noncalcified plaque at the LEFT carotid bifurcation, extending into the proximal ICA. Estimated 80-90% stenosis. 2. Non stenotic atheromatous change RIGHT carotid bifurcation, estimated 60% stenosis. 3. No intracranial flow-limiting stenosis or large vessel occlusion. Significant calcified plaque both distal cavernous carotid arteries, estimated 50-75% stenosis bilaterally. 4. Poor dentition, with numerous missing teeth, dental caries, and periapical lucencies. Electronically Signed   By: Elsie StainJohn T Curnes M.D.   On: 06/13/2018 14:44   Ct Head Code Stroke Wo Contrast  Addendum Date: 06/13/2018   ADDENDUM REPORT: 06/13/2018 13:21 ADDENDUM: Study discussed by telephone with Dr. Nita SickleAROLINA VERONESE on 06/13/2018 at 1312 hours. Electronically Signed   By: Odessa FlemingH  Hall M.D.   On: 06/13/2018 13:21   Result Date: 06/13/2018 CLINICAL DATA:  Code stroke. 62 year old male with slurred speech onset at 1217 hours. EXAM: CT HEAD WITHOUT CONTRAST TECHNIQUE: Contiguous axial images were obtained from the base of the skull through the vertex without intravenous contrast. COMPARISON:  None. FINDINGS: Brain: No midline shift, mass effect, or evidence of intracranial mass lesion. No acute intracranial hemorrhage identified. No ventriculomegaly. Gray-white matter differentiation  is within normal limits for age. No cortically based acute infarct identified. Vascular: Calcified atherosclerosis at the skull base. No suspicious intracranial vascular hyperdensity. Skull: Negative. Sinuses/Orbits: Visualized paranasal sinuses and mastoids are well pneumatized. Other: Visualized orbit soft tissues are within normal limits. Negative scalp soft tissues. ASPECTS Circles Of Care Stroke Program Early CT Score) - Ganglionic level infarction (caudate, lentiform nuclei, internal capsule, insula, M1-M3  cortex): 7 - Supraganglionic infarction (M4-M6 cortex): 3 Total score (0-10 with 10 being normal): 10 IMPRESSION: 1. Normal for age noncontrast CT appearance of the brain. 2. ASPECTS 10. Electronically Signed: By: Odessa Fleming M.D. On: 06/13/2018 13:09    Assessment/Plan Present on Admission: . Slurred speech -Bring in for 23-hour observation on MedSurg with telemetry -TIA order set initiated -Likely due to embolic etiology as patient has atrial fibrillation, however he also does have pretty significant carotid stenosis -UDS and UA ordered -Neurology consulted and has seen patient  . Carotid stenosis -Consult surgery, possible out patient carotid endarterectomy  . Elevated troponin -Chronically elevated at baseline, however, will cycle cardiac enzymes  Hypertension -Stable at baseline.  PRN blood pressure medications ordered  . CAD (coronary artery disease) -Stable, home medications resumed  . COPD (chronic obstructive pulmonary disease) (HCC) -Stable, home medications resumed  Chronic respiratory failure -Stable, home medications resumed.  Patient unsure how much oxygen is on.  Will start to keep sats approximately 88%  . HLD (hyperlipidemia) -Stable, home meds resumed -Lipid panel in a.m.   Nysa Sarin 06/13/2018, 3:17 PM

## 2018-06-13 NOTE — Consult Note (Addendum)
Referring Physician: Don Harrington    Chief Complaint: Slurred speech  HPI: [name] is an 62 y.o. male with a history of afib on Xarelto who reports that today while watching television had the acute onset of slurred speech.  Symptoms did not improve and patient presented for evaluation.  Initial NIHSS of 2.    Date last known well: Date: 06/13/2018 Time last known well: Time: 12:17 tPA Given: No: On anticoagulation  Past Medical History:  Diagnosis Date  . Atrial fibrillation (HCC)   . CAD in native artery    a. stress echo 12/2007 abnl, EF > 55%, b. LHC 01/28/08: mLAD 30, D1 40, dLCx 70, pRCA 30, mRCA 70, mRCA lesion 2 80, PDA 90, s/p PCI/BMS to prox and distal RCA, s/p PCI/BMS to PDA; c. patient reports PCI/stenting x 2 in early 2017 at the [facility] (no records on file) d. 06/2016: cath showing patent stents along RCA and LCx with moderate 40% stenosis along the LAD.   [name]. Chronic combined systolic (congestive) and diastolic (congestive) heart failure (HCC)    a. echo 2009: > 55% b. 06/2016: EF 25-30% by echo in 06/2016, cath showing patent stents with moderate disease along LAD  . COPD (chronic obstructive pulmonary disease) (HCC)   . Diabetes mellitus without complication (HCC)   . Gout   . Hyperlipidemia   . Hypertension   . Hypertensive heart disease   . Obesity   . Tobacco abuse     Past Surgical History:  Procedure Laterality Date  . CARDIAC CATHETERIZATION  2009   [facility];   . CARDIAC CATHETERIZATION  2010  . CARDIAC CATHETERIZATION N/A 07/28/2016   Procedure: Right and Left Heart Cath and possible PCI;  Surgeon: James OuchMuhammad A Arida, MD;  Location: [facility] INVASIVE CV LAB;  Service: Cardiovascular;  Laterality: N/A;  . CARDIOVERSION N/A 03/27/2017   Procedure: CARDIOVERSION;  Surgeon: James OuchArida, Muhammad A, MD;  Location: [facility] ORS;  Service: Cardiovascular;  Laterality: N/A;  . CORONARY ANGIOPLASTY  2009   [facility]    Family History  Problem Relation Age of Onset  .  Heart attack Mother [id number]   Social History:  reports that he has quit smoking. His smoking use included cigarettes. He has a 35.00 pack-year smoking history. He has never used smokeless tobacco. He reports previous alcohol use. He reports that he does not use drugs.  Allergies:  Allergies  Allergen Reactions  . Aspirin Other (See Comments)    Told not to take because of blood thinner  . Ibuprofen Nausea And Vomiting  . Tylenol [Acetaminophen] Nausea And Vomiting    Medications: I have reviewed the patient's current medications. Prior to Admission:  Prior to Admission medications   Medication Sig Start Date End Date Taking? Authorizing Provider  albuterol-ipratropium (COMBIVENT) 18-103 MCG/ACT inhaler Inhale 2 puffs into the lungs every 4 (four) hours as needed for wheezing.    [provider]  atorvastatin (LIPITOR) 40 MG tablet Take 40 mg by mouth daily.     [provider]  budesonide-formoterol (SYMBICORT) 160-4.5 MCG/ACT inhaler Inhale 2 puffs into the lungs 2 (two) times daily.    [provider]  carvedilol (COREG) 25 MG tablet Take 1 tablet (25 mg total) by mouth 2 (two) times daily with a meal. 07/31/16   [name], Sona, MD  cephALEXin (KEFLEX) 500 MG capsule Take 1 capsule (500 mg total) by mouth every 12 (twelve) hours. 01/22/18   [name], Qing, MD  docusate sodium (COLACE) 100 MG  capsule Take 100 mg by mouth 2 (two) times daily.    [provider]  losartan (COZAAR) 25 MG tablet Take 2 tablets (50 mg total) by mouth daily. 01/21/18   James Pollack, MD  metFORMIN (GLUCOPHAGE) 1000 MG tablet Take 1,000 mg by mouth 2 (two) times daily with a meal.     [provider]  methocarbamol (ROBAXIN) 500 MG tablet Take 1 tablet (500 mg total) by mouth 4 (four) times daily. 12/17/17   James Rumps, PA-C  nitroGLYCERIN (NITROSTAT) 0.4 MG SL tablet Place 0.4 mg under the tongue every 5 (five) minutes as needed for chest pain.    [provider]   oxyCODONE (OXY IR/ROXICODONE) 5 MG immediate release tablet Take 1 tablet (5 mg total) by mouth every 6 (six) hours as needed for severe pain. 12/17/17   James Rumps, PA-C  potassium chloride SA (K-DUR,KLOR-CON) 20 MEQ tablet Take 1 tablet (20 mEq total) by mouth daily. 01/22/18   James Pollack, MD  rivaroxaban (XARELTO) 20 MG TABS tablet Take 1 tablet (20 mg total) by mouth daily with supper. 02/19/17   James Saran, MD  spironolactone (ALDACTONE) 25 MG tablet Take 1 tablet (25 mg total) by mouth daily. 08/01/16   James Finner, MD  torsemide (DEMADEX) 20 MG tablet Take 2 tablets (40 mg total) by mouth 2 (two) times daily. 01/21/18   James Pollack, MD  traZODone (DESYREL) 50 MG tablet Take 50 mg by mouth at bedtime.    [provider]    ROS: History obtained from the patient  General ROS: negative for - chills, fatigue, fever, night sweats, weight gain or weight loss Psychological ROS: negative for - behavioral disorder, hallucinations, memory difficulties, mood swings or suicidal ideation Ophthalmic ROS: negative for - blurry vision, double vision, eye pain or loss of vision ENT ROS: negative for - epistaxis, nasal discharge, oral lesions, sore throat, tinnitus or vertigo Allergy and Immunology ROS: negative for - hives or itchy/watery eyes Hematological and Lymphatic ROS: negative for - bleeding problems, bruising or swollen lymph nodes Endocrine ROS: negative for - galactorrhea, hair pattern changes, polydipsia/polyuria or temperature intolerance Respiratory ROS: negative for - cough, hemoptysis, shortness of breath or wheezing Cardiovascular ROS: negative for - chest pain, dyspnea on exertion, edema or irregular heartbeat Gastrointestinal ROS: negative for - abdominal pain, diarrhea, hematemesis, nausea/vomiting or stool incontinence Genito-Urinary ROS: negative for - dysuria, hematuria, incontinence or urinary frequency/urgency Musculoskeletal ROS: negative for - joint swelling or  muscular weakness Neurological ROS: as noted in HPI Dermatological ROS: negative for rash and skin lesion changes  Physical Examination: Blood pressure (!) 168/109, pulse 92, temperature 97.7 F (36.5 C), temperature source Oral, resp. rate 18, height 5\' 11"  (1.803 m), weight 86.2 kg, SpO2 100 %.  HEENT-  Normocephalic, no lesions, without obvious abnormality.  Normal external eye and conjunctiva.  Normal TM's bilaterally.  Normal auditory canals and external ears. Normal external nose, mucus membranes and septum.  Normal pharynx. Cardiovascular- S1, S2 normal, pulses palpable throughout   Lungs- chest clear, no wheezing, rales, normal symmetric air entry Abdomen- soft, non-tender; bowel sounds normal; no masses,  no organomegaly Extremities- no edema Lymph-no adenopathy palpable Musculoskeletal-no joint tenderness, deformity or swelling Skin-warm and dry, no hyperpigmentation, vitiligo, or suspicious lesions  Neurological Examination   Mental Status: Alert, oriented, thought content appropriate.  Speech fluent without evidence of aphasia.  Dysarthric.  Able to follow 3 step commands without difficulty. Cranial Nerves: II: Discs flat bilaterally; Visual fields  grossly normal, pupils equal, round, reactive to light and accommodation III,IV, VI: ptosis not present, extra-ocular motions intact bilaterally V,VII: left facial droop, facial light touch sensation normal bilaterally VIII: hearing normal bilaterally IX,X: gag reflex present XI: bilateral shoulder shrug XII: midline tongue extension Motor: Right : Upper extremity   5/5    Left:     Upper extremity   5/5  Lower extremity   5/5     Lower extremity   5/5 Tone and bulk:normal tone throughout; no atrophy noted Sensory: Pinprick and light touch intact throughout, bilaterally Deep Tendon Reflexes: 2+ and symmetric with absent AJ's bilaterally Plantars: Right: downgoing   Left: downgoing Cerebellar: Normal finger-to-nose and  normal heel-to-shin testing bilaterally Gait: not tested due to safety concerns    Laboratory Studies:  Basic Metabolic Panel: No results for input(s): NA, K, CL, CO2, GLUCOSE, BUN, CREATININE, CALCIUM, MG, PHOS in the last 168 hours.  Liver Function Tests: No results for input(s): AST, ALT, ALKPHOS, BILITOT, PROT, ALBUMIN in the last 168 hours. No results for input(s): LIPASE, AMYLASE in the last 168 hours. No results for input(s): AMMONIA in the last 168 hours.  CBC: No results for input(s): WBC, NEUTROABS, HGB, HCT, MCV, PLT in the last 168 hours.  Cardiac Enzymes: No results for input(s): CKTOTAL, CKMB, CKMBINDEX, TROPONINI in the last 168 hours.  BNP: Invalid input(s): POCBNP  CBG: No results for input(s): GLUCAP in the last 168 hours.  Microbiology: Results for orders placed or performed during the hospital encounter of 01/19/18  Urine culture     Status: Abnormal   Collection Time: 01/19/18  5:28 PM  Result Value Ref Range Status   Specimen Description   Final    URINE, CLEAN CATCH Performed at [facility], [address], [place], [place] [address]    Special Requests   Final    NONE Performed at [facility] [address] [place], [place] [address]    Culture >=100,000 COLONIES/mL ESCHERICHIA COLI (A)  Final   Report Status 01/21/2018 FINAL  Final   Organism ID, Bacteria ESCHERICHIA COLI (A)  Final      Susceptibility   Escherichia coli - MIC*    AMPICILLIN <=2 SENSITIVE Sensitive     CEFAZOLIN <=4 SENSITIVE Sensitive     CEFTRIAXONE <=1 SENSITIVE Sensitive     CIPROFLOXACIN <=0.25 SENSITIVE Sensitive     GENTAMICIN <=1 SENSITIVE Sensitive     IMIPENEM <=0.25 SENSITIVE Sensitive     NITROFURANTOIN 32 SENSITIVE Sensitive     TRIMETH/SULFA <=20 SENSITIVE Sensitive     AMPICILLIN/SULBACTAM <=2 SENSITIVE Sensitive     PIP/TAZO <=4 SENSITIVE Sensitive     Extended ESBL NEGATIVE Sensitive     * >=100,000 COLONIES/mL ESCHERICHIA COLI     Coagulation Studies: No results for input(s): LABPROT, INR in the last 72 hours.  Urinalysis: No results for input(s): COLORURINE, LABSPEC, PHURINE, GLUCOSEU, HGBUR, BILIRUBINUR, KETONESUR, PROTEINUR, UROBILINOGEN, NITRITE, LEUKOCYTESUR in the last 168 hours.  Invalid input(s): APPERANCEUR  Lipid Panel:    Component Value Date/Time   CHOL 154 01/21/2018 0323   CHOL 191 12/09/2012 0439   TRIG 92 01/21/2018 0323   TRIG 78 12/09/2012 0439   HDL 40 (L) 01/21/2018 0323   HDL 54 12/09/2012 0439   CHOLHDL 3.9 01/21/2018 0323   VLDL 18 01/21/2018 0323   VLDL 16 12/09/2012 0439   LDLCALC 96 01/21/2018 0323   LDLCALC 121 (H) 12/09/2012 0439    HgbA1C:  Lab Results  Component  Value Date   HGBA1C 7.3 (H) 01/05/2017    Urine Drug Screen:      Component Value Date/Time   LABOPIA NONE DETECTED 01/20/2018 1026   COCAINSCRNUR NONE DETECTED 01/20/2018 1026   LABBENZ NONE DETECTED 01/20/2018 1026   AMPHETMU NONE DETECTED 01/20/2018 1026   THCU NONE DETECTED 01/20/2018 1026   LABBARB NONE DETECTED 01/20/2018 1026    Alcohol Level: No results for input(s): ETH in the last 168 hours.  Other results: EKG: atrial fibrillation, rate 107 bpm.  Imaging: Ct Head Code Stroke Wo Contrast  Result Date: 06/13/2018 CLINICAL DATA:  Code stroke. 62 year old male with slurred speech onset at 1217 hours. EXAM: CT HEAD WITHOUT CONTRAST TECHNIQUE: Contiguous axial images were obtained from the base of the skull through the vertex without intravenous contrast. COMPARISON:  None. FINDINGS: Brain: No midline shift, mass effect, or evidence of intracranial mass lesion. No acute intracranial hemorrhage identified. No ventriculomegaly. Gray-white matter differentiation is within normal limits for age. No cortically based acute infarct identified. Vascular: Calcified atherosclerosis at the skull base. No suspicious intracranial vascular hyperdensity. Skull: Negative. Sinuses/Orbits: Visualized paranasal  sinuses and mastoids are well pneumatized. Other: Visualized orbit soft tissues are within normal limits. Negative scalp soft tissues. ASPECTS [facility] Stroke Program Early CT Score) - Ganglionic level infarction (caudate, lentiform nuclei, internal capsule, insula, M1-M3 cortex): 7 - Supraganglionic infarction (M4-M6 cortex): 3 Total score (0-10 with 10 being normal): 10 IMPRESSION: 1. Normal for age noncontrast CT appearance of the brain. 2. ASPECTS 10. Electronically Signed   By: Odessa Fleming M.D.   On: 06/13/2018 13:09    Assessment: 62 y.o. male with a history of afib on Xarelto, presenting with acute onset dysarthria.  Noted to have facial droop on examination as well.  Head CT reviewed and shows no acute changes.  Patient ineligible for tPA due to being on anticoagulation.  Will review vasculature to rule out need for intervention since posterior circulation event is in the differential.  Stroke Risk Factors - atrial fibrillation, hyperlipidemia and hypertension  Plan: 1. HgbA1c, fasting lipid panel 2. MRI of the brain without contrast 3. PT consult, OT consult, Speech consult 4. Echocardiogram 5. CTA of the head and neck.  If unremarkable patient to be admitted for the remainder of the stroke work up  6. Prophylactic therapy-Continue Xarelto 7. NPO until RN stroke swallow screen 8. Telemetry monitoring 9. Frequent neuro checks  Case discussed with Dr. Cheri Rous, MD Neurology [phone] 06/13/2018, 1:22 PM  Addendum: CTA reviewed.  Patient to be evaluated by vascular as an inpatient.    Thana Farr, MD Neurology [phone]

## 2018-06-13 NOTE — ED Notes (Signed)
Pt provided phone for second time to attempt to reach family

## 2018-06-13 NOTE — ED Provider Notes (Signed)
[facility] Emergency Department Provider Note  ____________________________________________  Time seen: Approximately 3:23 PM  I have reviewed the triage vital signs and the nursing notes.   HISTORY  Chief Complaint Aphasia   HPI [name] is a 62 y.o. male with a history of atrial fibrillation on Xarelto, CAD, CHF, COPD, diabetes, hypertension, hyperlipidemia, smoking, obesity who presents for evaluation of slurred speech.  Patient reports sudden onset of slurred speech at 1217 this afternoon while watching TV.  No prior history of stroke.  Patient denies headache, chest pain, shortness of breath, facial droop, unilateral weakness or numbness, vertigo.  Patient denies family history of stroke.  Past Medical History:  Diagnosis Date  . Atrial fibrillation (HCC)   . CAD in native artery    a. stress echo 12/2007 abnl, EF > 55%, b. LHC 01/28/08: mLAD 30, D1 40, dLCx 70, pRCA 30, mRCA 70, mRCA lesion 2 80, PDA 90, s/p PCI/BMS to prox and distal RCA, s/p PCI/BMS to PDA; c. patient reports PCI/stenting x 2 in early 2017 at the [facility] (no records on file) d. 06/2016: cath showing patent stents along RCA and LCx with moderate 40% stenosis along the LAD.   [name]. Chronic combined systolic (congestive) and diastolic (congestive) heart failure (HCC)    a. echo 2009: > 55% b. 06/2016: EF 25-30% by echo in 06/2016, cath showing patent stents with moderate disease along LAD  . COPD (chronic obstructive pulmonary disease) (HCC)   . Diabetes mellitus without complication (HCC)   . Gout   . Hyperlipidemia   . Hypertension   . Hypertensive heart disease   . Obesity   . Tobacco abuse     Patient Active Problem List   Diagnosis Date Noted  . Slurred speech 06/13/2018  . Carotid stenosis 06/13/2018  . Elevated troponin 06/13/2018  . Atrial fibrillation, chronic 06/13/2018  . Type 2 diabetes mellitus without complication (HCC) 06/13/2018  . Acute respiratory failure with  hypoxia (HCC) 01/19/2018  . Atrial fibrillation with RVR (HCC) 03/26/2017  . Atrial flutter with rapid ventricular response (HCC) 02/18/2017  . Diastolic CHF, acute on chronic (HCC) 01/06/2017  . CAD in native artery   . Chest pain 01/05/2017  . Ischemic cardiomyopathy 07/26/2016  . Non-sustained ventricular tachycardia (HCC) 07/26/2016  . COPD (chronic obstructive pulmonary disease) (HCC) 07/24/2016  . CAD (coronary artery disease) 07/24/2016  . HLD (hyperlipidemia) 07/24/2016  . Acute on chronic combined systolic and diastolic CHF (congestive heart failure) (HCC) 07/24/2016  . Acute respiratory distress 07/24/2016  . Tobacco abuse 07/24/2016  . Accelerated hypertension 07/24/2016  . Hypertensive heart disease 07/24/2016    Past Surgical History:  Procedure Laterality Date  . CARDIAC CATHETERIZATION  2009   [facility];   . CARDIAC CATHETERIZATION  2010  . CARDIAC CATHETERIZATION N/A 07/28/2016   Procedure: Right and Left Heart Cath and possible PCI;  Surgeon: Iran OuchMuhammad A Arida, MD;  Location: [facility] INVASIVE CV LAB;  Service: Cardiovascular;  Laterality: N/A;  . CARDIOVERSION N/A 03/27/2017   Procedure: CARDIOVERSION;  Surgeon: Iran OuchArida, Muhammad A, MD;  Location: [facility] ORS;  Service: Cardiovascular;  Laterality: N/A;  . CORONARY ANGIOPLASTY  2009   [facility].    Prior to Admission medications   Medication Sig Start Date End Date Taking? Authorizing Provider  albuterol-ipratropium (COMBIVENT) 18-103 MCG/ACT inhaler Inhale 2 puffs into the lungs every 4 (four) hours as needed for wheezing.   Yes [provider]  atorvastatin (LIPITOR) 40 MG tablet Take  40 mg by mouth daily.    Yes [provider]  budesonide-formoterol (SYMBICORT) 160-4.5 MCG/ACT inhaler Inhale 2 puffs into the lungs 2 (two) times daily.   Yes [provider]  carvedilol (COREG) 25 MG tablet Take 1 tablet (25 mg total) by mouth 2 (two) times daily with a meal. 07/31/16  Yes Enedina Finner,  MD  docusate sodium (COLACE) 100 MG capsule Take 100 mg by mouth 2 (two) times daily.   Yes [provider]  metFORMIN (GLUCOPHAGE) 1000 MG tablet Take 1,000 mg by mouth 2 (two) times daily with a meal.    Yes [provider]  methocarbamol (ROBAXIN) 500 MG tablet Take 1 tablet (500 mg total) by mouth 4 (four) times daily. 12/17/17  Yes Tommi Rumps, PA-C  nitroGLYCERIN (NITROSTAT) 0.4 MG SL tablet Place 0.4 mg under the tongue every 5 (five) minutes as needed for chest pain.   Yes [provider]  potassium chloride SA (K-DUR,KLOR-CON) 20 MEQ tablet Take 1 tablet (20 mEq total) by mouth daily. 01/22/18  Yes Shaune Pollack, MD  rivaroxaban (XARELTO) 20 MG TABS tablet Take 1 tablet (20 mg total) by mouth daily with supper. 02/19/17  Yes Adrian Saran, MD  spironolactone (ALDACTONE) 25 MG tablet Take 1 tablet (25 mg total) by mouth daily. 08/01/16  Yes Enedina Finner, MD  torsemide (DEMADEX) 20 MG tablet Take 2 tablets (40 mg total) by mouth 2 (two) times daily. 01/21/18  Yes Shaune Pollack, MD  traZODone (DESYREL) 50 MG tablet Take 50 mg by mouth at bedtime.   Yes [provider]  cephALEXin (KEFLEX) 500 MG capsule Take 1 capsule (500 mg total) by mouth every 12 (twelve) hours. Patient not taking: Reported on 06/13/2018 01/22/18   Shaune Pollack, MD  losartan (COZAAR) 25 MG tablet Take 2 tablets (50 mg total) by mouth daily. Patient not taking: Reported on 06/13/2018 01/21/18   Shaune Pollack, MD  oxyCODONE (OXY IR/ROXICODONE) 5 MG immediate release tablet Take 1 tablet (5 mg total) by mouth every 6 (six) hours as needed for severe pain. Patient not taking: Reported on 06/13/2018 12/17/17   Tommi Rumps, PA-C    Allergies Aspirin; Ibuprofen; and Tylenol [acetaminophen]  Family History  Problem Relation Age of Onset  . Heart attack Mother 73    Social History Social History   Tobacco Use  . Smoking status: Former Smoker    Packs/day: 1.00    Years: 35.00    Pack years:  35.00    Types: Cigarettes  . Smokeless tobacco: Never Used  Substance Use Topics  . Alcohol use: Not Currently    Comment: Quit 8 yrs.  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 Drug use: No    Review of Systems  Constitutional: Negative for fever. Eyes: Negative for visual changes. ENT: Negative for sore throat. Neck: No neck pain  Cardiovascular: Negative for chest pain. Respiratory: Negative for shortness of breath. Gastrointestinal: Negative for abdominal pain, vomiting or diarrhea. Genitourinary: Negative for dysuria. Musculoskeletal: Negative for back pain. Skin: Negative for rash. Neurological: Negative for headaches, weakness or numbness. + slurred speech Psych: No SI or HI  ____________________________________________   PHYSICAL EXAM:  VITAL SIGNS: ED Triage Vitals  Enc Vitals Group     BP 06/13/18 1256 (!) 168/109     Pulse Rate 06/13/18 1256 100     Resp 06/13/18 1256 14     Temp 06/13/18 1309 97.7 F (36.5 C)     Temp Source 06/13/18 1309 Oral  SpO2 06/13/18 1256 99 %     Weight 06/13/18 1252 190 lb (86.2 kg)     Height 06/13/18 1252 5\' 11"  (1.803 m)     Head Circumference --      Peak Flow --      Pain Score 06/13/18 1252 0     Pain Loc --      Pain Edu? --      Excl. in GC? --     Constitutional: Alert and oriented. Well appearing and in no apparent distress. HEENT:      Head: Normocephalic and atraumatic.         Eyes: Conjunctivae are normal. Sclera is non-icteric.       Mouth/Throat: Mucous membranes are moist.       Neck: Supple with no signs of meningismus. Cardiovascular: Irregularly irregular rhythm with normal rate. Respiratory: Normal respiratory effort. Lungs are clear to auscultation bilaterally. No wheezes, crackles, or rhonchi.  Gastrointestinal: Soft, non tender, and non distended with positive bowel sounds. No rebound or guarding. Musculoskeletal: Nontender with normal range of motion in all extremities. No edema, cyanosis, or erythema of  extremities. Neurologic: Slurred speech, slight left-sided facial droop, normal strength and sensation x4, extraocular movements are intact, no dysmetria to finger-to-nose finger, no pronator drift. Skin: Skin is warm, dry and intact. No rash noted. Psychiatric: Mood and affect are normal. Speech and behavior are normal.  ____________________________________________   LABS (all labs ordered are listed, but only abnormal results are displayed)  Labs Reviewed  PROTIME-INR - Abnormal; Notable for the following components:      Result Value   Prothrombin Time 26.4 (*)    All other components within normal limits  APTT - Abnormal; Notable for the following components:   aPTT 38 (*)    All other components within normal limits  COMPREHENSIVE METABOLIC PANEL - Abnormal; Notable for the following components:   Glucose, Bld 135 (*)    AST 43 (*)    Alkaline Phosphatase 199 (*)    Total Bilirubin 1.4 (*)    All other components within normal limits  TROPONIN I - Abnormal; Notable for the following components:   Troponin I 0.15 (*)    All other components within normal limits  CBC  DIFFERENTIAL  URINALYSIS, COMPLETE (UACMP) WITH MICROSCOPIC  CBG MONITORING, ED   ____________________________________________  EKG   ED ECG REPORT I, Nita Sickle, the attending physician, personally viewed and interpreted this ECG.  Atrial fibrillation, rate of 107, normal intervals, normal axis, no ST elevations or depressions, T wave inversions in lateral and inferior leads.  Unchanged from prior. ____________________________________________  RADIOLOGY  I have personally reviewed the images performed during this visit and I agree with the Radiologist's read.   Interpretation by Radiologist:  Ct Angio Head W Or Wo Contrast  Result Date: 06/13/2018 CLINICAL DATA:  Acute onset dysarthria earlier today. LEFT facial droop. Atrial fibrillation, on Xarelto. EXAM: CT ANGIOGRAPHY HEAD AND NECK  TECHNIQUE: Multidetector CT imaging of the head and neck was performed using the standard protocol during bolus administration of intravenous contrast. Multiplanar CT image reconstructions and MIPs were obtained to evaluate the vascular anatomy. Carotid stenosis measurements (when applicable) are obtained utilizing NASCET criteria, using the distal internal carotid diameter as the denominator. CONTRAST:  75mL ISOVUE-370 IOPAMIDOL (ISOVUE-370) INJECTION 76% COMPARISON:  Code stroke CT earlier today was negative. FINDINGS: CTA NECK FINDINGS Aortic arch: Standard branching. Imaged portion shows no evidence of aneurysm or dissection. No significant stenosis of the  major arch vessel origins. Aortic atherosclerosis. Right carotid system: Moderate soft plaque distal CCA, nonstenotic. Moderate to severe calcified and noncalcified plaque at the bifurcation extending into the proximal RIGHT ICA. Estimated 60% ICA stenosis based on luminal measurements of 1.6/4.1 proximal/distal. Cervical ICA patent. No dissection. Left carotid system: Minor soft plaque in the distal common carotid artery, nonstenotic. Severe calcified and noncalcified plaque at the bifurcation, extending into the proximal ICA. Estimated flow-limiting 80-90% stenosis based on luminal measurements of 0.9/4.5 proximal/distal. Cervical ICA patent. No dissection. Vertebral arteries: Nonstenotic ostial plaque at both vertebral origins. Nonstenotic calcification of both proximal vertebral arteries which are patent, RIGHT slightly larger. Skeleton: Advanced cervical spondylosis, disc space narrowing most pronounced C4-5, C5-6, and C6-7. Poor dentition, numerous missing teeth, dental caries and periapical lucencies. Other neck: Unremarkable. Upper chest: No mass, pneumothorax, or effusion. Review of the MIP images confirms the above findings CTA HEAD FINDINGS Anterior circulation: Heavily calcified carotid siphons bilaterally, estimated 50-75% stenoses in the  BILATERAL distal cavernous segments. ICA termini patent. No flow-limiting stenosis of the anterior or middle cerebral arteries. No MCA branch occlusion, although mild irregularity of the distal MCA branches is observed bilaterally. Posterior circulation: Dominant RIGHT vertebral supplies the basilar. Calcified stenosis of the LEFT vertebral estimated 50-75% in its V4 segment. Basilar artery is mildly irregular, and mildly hypoplastic due to fetal LEFT PCA. No flow-limiting PCA stenosis or cerebellar branch occlusion although the LEFT PCA appears slightly more irregular than the RIGHT. Venous sinuses: As permitted by contrast timing, patent. Anatomic variants: Fetal LEFT PCA. Delayed phase: Not performed. Review of the MIP images confirms the above findings IMPRESSION: 1. Severe calcified and noncalcified plaque at the LEFT carotid bifurcation, extending into the proximal ICA. Estimated 80-90% stenosis. 2. Non stenotic atheromatous change RIGHT carotid bifurcation, estimated 60% stenosis. 3. No intracranial flow-limiting stenosis or large vessel occlusion. Significant calcified plaque both distal cavernous carotid arteries, estimated 50-75% stenosis bilaterally. 4. Poor dentition, with numerous missing teeth, dental caries, and periapical lucencies. Electronically Signed   By: Elsie Stain M.D.   On: 06/13/2018 14:44   Ct Angio Neck W Or Wo Contrast  Result Date: 06/13/2018 CLINICAL DATA:  Acute onset dysarthria earlier today. LEFT facial droop. Atrial fibrillation, on Xarelto. EXAM: CT ANGIOGRAPHY HEAD AND NECK TECHNIQUE: Multidetector CT imaging of the head and neck was performed using the standard protocol during bolus administration of intravenous contrast. Multiplanar CT image reconstructions and MIPs were obtained to evaluate the vascular anatomy. Carotid stenosis measurements (when applicable) are obtained utilizing NASCET criteria, using the distal internal carotid diameter as the denominator. CONTRAST:   75mL ISOVUE-370 IOPAMIDOL (ISOVUE-370) INJECTION 76% COMPARISON:  Code stroke CT earlier today was negative. FINDINGS: CTA NECK FINDINGS Aortic arch: Standard branching. Imaged portion shows no evidence of aneurysm or dissection. No significant stenosis of the major arch vessel origins. Aortic atherosclerosis. Right carotid system: Moderate soft plaque distal CCA, nonstenotic. Moderate to severe calcified and noncalcified plaque at the bifurcation extending into the proximal RIGHT ICA. Estimated 60% ICA stenosis based on luminal measurements of 1.6/4.1 proximal/distal. Cervical ICA patent. No dissection. Left carotid system: Minor soft plaque in the distal common carotid artery, nonstenotic. Severe calcified and noncalcified plaque at the bifurcation, extending into the proximal ICA. Estimated flow-limiting 80-90% stenosis based on luminal measurements of 0.9/4.5 proximal/distal. Cervical ICA patent. No dissection. Vertebral arteries: Nonstenotic ostial plaque at both vertebral origins. Nonstenotic calcification of both proximal vertebral arteries which are patent, RIGHT slightly larger. Skeleton: Advanced cervical spondylosis, disc  space narrowing most pronounced C4-5, C5-6, and C6-7. Poor dentition, numerous missing teeth, dental caries and periapical lucencies. Other neck: Unremarkable. Upper chest: No mass, pneumothorax, or effusion. Review of the MIP images confirms the above findings CTA HEAD FINDINGS Anterior circulation: Heavily calcified carotid siphons bilaterally, estimated 50-75% stenoses in the BILATERAL distal cavernous segments. ICA termini patent. No flow-limiting stenosis of the anterior or middle cerebral arteries. No MCA branch occlusion, although mild irregularity of the distal MCA branches is observed bilaterally. Posterior circulation: Dominant RIGHT vertebral supplies the basilar. Calcified stenosis of the LEFT vertebral estimated 50-75% in its V4 segment. Basilar artery is mildly irregular,  and mildly hypoplastic due to fetal LEFT PCA. No flow-limiting PCA stenosis or cerebellar branch occlusion although the LEFT PCA appears slightly more irregular than the RIGHT. Venous sinuses: As permitted by contrast timing, patent. Anatomic variants: Fetal LEFT PCA. Delayed phase: Not performed. Review of the MIP images confirms the above findings IMPRESSION: 1. Severe calcified and noncalcified plaque at the LEFT carotid bifurcation, extending into the proximal ICA. Estimated 80-90% stenosis. 2. Non stenotic atheromatous change RIGHT carotid bifurcation, estimated 60% stenosis. 3. No intracranial flow-limiting stenosis or large vessel occlusion. Significant calcified plaque both distal cavernous carotid arteries, estimated 50-75% stenosis bilaterally. 4. Poor dentition, with numerous missing teeth, dental caries, and periapical lucencies. Electronically Signed   By: Elsie Stain M.D.   On: 06/13/2018 14:44   Ct Head Code Stroke Wo Contrast  Addendum Date: 06/13/2018   ADDENDUM REPORT: 06/13/2018 13:21 ADDENDUM: Study discussed by telephone with Dr. Nita Sickle on 06/13/2018 at 1312 hours. Electronically Signed   By: Odessa Fleming M.D.   On: 06/13/2018 13:21   Result Date: 06/13/2018 CLINICAL DATA:  Code stroke. 62 year old male with slurred speech onset at 1217 hours. EXAM: CT HEAD WITHOUT CONTRAST TECHNIQUE: Contiguous axial images were obtained from the base of the skull through the vertex without intravenous contrast. COMPARISON:  None. FINDINGS: Brain: No midline shift, mass effect, or evidence of intracranial mass lesion. No acute intracranial hemorrhage identified. No ventriculomegaly. Gray-white matter differentiation is within normal limits for age. No cortically based acute infarct identified. Vascular: Calcified atherosclerosis at the skull base. No suspicious intracranial vascular hyperdensity. Skull: Negative. Sinuses/Orbits: Visualized paranasal sinuses and mastoids are well pneumatized.  Other: Visualized orbit soft tissues are within normal limits. Negative scalp soft tissues. ASPECTS [facility] Stroke Program Early CT Score) - Ganglionic level infarction (caudate, lentiform nuclei, internal capsule, insula, M1-M3 cortex): 7 - Supraganglionic infarction (M4-M6 cortex): 3 Total score (0-10 with 10 being normal): 10 IMPRESSION: 1. Normal for age noncontrast CT appearance of the brain. 2. ASPECTS 10. Electronically Signed: By: Odessa Fleming M.D. On: 06/13/2018 13:09     ____________________________________________   PROCEDURES  Procedure(s) performed: None Procedures Critical Care performed: yes  CRITICAL CARE Performed by: Nita Sickle  ?  Total critical care time: 35 min  Critical care time was exclusive of separately billable procedures and treating other patients.  Critical care was necessary to treat or prevent imminent or life-threatening deterioration.  Critical care was time spent personally by me on the following activities: development of treatment plan with patient and/or surrogate as well as nursing, discussions with consultants, evaluation of patient's response to treatment, examination of patient, obtaining history from patient or surrogate, ordering and performing treatments and interventions, ordering and review of laboratory studies, ordering and review of radiographic studies, pulse oximetry and re-evaluation of patient's condition.  ____________________________________________   INITIAL IMPRESSION / ASSESSMENT AND PLAN /  ED COURSE  62 y.o. male with a history of atrial fibrillation on Xarelto, CAD, CHF, COPD, diabetes, hypertension, hyperlipidemia, smoking, obesity who presents for evaluation of slurred speech.  Patient arrives with slurred speech and left-sided facial droop.  NIH stroke scale of 2.  Patient was made a code stroke.  Evaluated by Dr. Thad Ranger in the emergency department.  CT head negative for hemorrhagic stroke.  EKG showing atrial  fibrillation.  CTA of of the neck showing severe left carotid stenosis.  Per Dr. Thad Ranger recommendation no intervention or tPA at this time.  Recommended admission to the hospitalist service.  Patient has allergy to aspirin therefore that was held.  Troponin slightly elevated at 0.15 which is double of patient's baseline.  Most likely demand ischemia in the setting of acute stroke and atrial fibrillation.  Patient has no chest pain or shortness of breath.      As part of my medical decision making, I reviewed the following data within the electronic MEDICAL RECORD NUMBER Nursing notes reviewed and incorporated, Labs reviewed , EKG interpreted , Old EKG reviewed, Old chart reviewed, Radiograph reviewed , Discussed with admitting physician , A consult was requested and obtained from this/these consultant(s) Neurology, Notes from prior ED visits and [place] Controlled Substance Database    Pertinent labs & imaging results that were available during my care of the patient were reviewed by me and considered in my medical decision making (see chart for details).    ____________________________________________   FINAL CLINICAL IMPRESSION(S) / ED DIAGNOSES  Final diagnoses:  Cerebrovascular accident (CVA), unspecified mechanism (HCC)  Demand ischemia of myocardium (HCC)      NEW MEDICATIONS STARTED DURING THIS VISIT:  ED Discharge Orders    None       Note:  This document was prepared using Dragon voice recognition software and may include unintentional dictation errors.    Nita Sickle, MD 06/13/18 [phone]

## 2018-06-14 ENCOUNTER — Observation Stay: Payer: Medicare Other

## 2018-06-14 ENCOUNTER — Observation Stay (HOSPITAL_BASED_OUTPATIENT_CLINIC_OR_DEPARTMENT_OTHER)
Admit: 2018-06-14 | Discharge: 2018-06-14 | Disposition: A | Discharge status: Home / Self Care | Payer: Medicare Other | Attending: Family Medicine | Admitting: Family Medicine

## 2018-06-14 DIAGNOSIS — Z955 Presence of coronary angioplasty implant and graft: Secondary | ICD-10-CM | POA: Diagnosis not present

## 2018-06-14 DIAGNOSIS — Z6830 Body mass index (BMI) 30.0-30.9, adult: Secondary | ICD-10-CM | POA: Diagnosis not present

## 2018-06-14 DIAGNOSIS — I255 Ischemic cardiomyopathy: Secondary | ICD-10-CM | POA: Diagnosis present

## 2018-06-14 DIAGNOSIS — E1151 Type 2 diabetes mellitus with diabetic peripheral angiopathy without gangrene: Secondary | ICD-10-CM | POA: Diagnosis present

## 2018-06-14 DIAGNOSIS — M109 Gout, unspecified: Secondary | ICD-10-CM | POA: Diagnosis present

## 2018-06-14 DIAGNOSIS — R4781 Slurred speech: Secondary | ICD-10-CM

## 2018-06-14 DIAGNOSIS — Z7901 Long term (current) use of anticoagulants: Secondary | ICD-10-CM | POA: Diagnosis not present

## 2018-06-14 DIAGNOSIS — E785 Hyperlipidemia, unspecified: Secondary | ICD-10-CM | POA: Diagnosis not present

## 2018-06-14 DIAGNOSIS — I63233 Cerebral infarction due to unspecified occlusion or stenosis of bilateral carotid arteries: Secondary | ICD-10-CM | POA: Diagnosis not present

## 2018-06-14 DIAGNOSIS — R4701 Aphasia: Secondary | ICD-10-CM | POA: Diagnosis present

## 2018-06-14 DIAGNOSIS — E119 Type 2 diabetes mellitus without complications: Secondary | ICD-10-CM | POA: Diagnosis not present

## 2018-06-14 DIAGNOSIS — I482 Chronic atrial fibrillation, unspecified: Secondary | ICD-10-CM | POA: Diagnosis present

## 2018-06-14 DIAGNOSIS — I2510 Atherosclerotic heart disease of native coronary artery without angina pectoris: Secondary | ICD-10-CM | POA: Diagnosis present

## 2018-06-14 DIAGNOSIS — I110 Hypertensive heart disease with heart failure: Secondary | ICD-10-CM | POA: Diagnosis present

## 2018-06-14 DIAGNOSIS — Z8249 Family history of ischemic heart disease and other diseases of the circulatory system: Secondary | ICD-10-CM | POA: Diagnosis not present

## 2018-06-14 DIAGNOSIS — R29810 Facial weakness: Secondary | ICD-10-CM | POA: Diagnosis present

## 2018-06-14 DIAGNOSIS — I10 Essential (primary) hypertension: Secondary | ICD-10-CM | POA: Diagnosis not present

## 2018-06-14 DIAGNOSIS — I6381 Other cerebral infarction due to occlusion or stenosis of small artery: Secondary | ICD-10-CM | POA: Diagnosis present

## 2018-06-14 DIAGNOSIS — I639 Cerebral infarction, unspecified: Secondary | ICD-10-CM | POA: Diagnosis present

## 2018-06-14 DIAGNOSIS — I5042 Chronic combined systolic (congestive) and diastolic (congestive) heart failure: Secondary | ICD-10-CM | POA: Diagnosis present

## 2018-06-14 DIAGNOSIS — I248 Other forms of acute ischemic heart disease: Secondary | ICD-10-CM | POA: Diagnosis present

## 2018-06-14 DIAGNOSIS — I160 Hypertensive urgency: Secondary | ICD-10-CM | POA: Diagnosis present

## 2018-06-14 DIAGNOSIS — R29702 NIHSS score 2: Secondary | ICD-10-CM | POA: Diagnosis present

## 2018-06-14 DIAGNOSIS — R471 Dysarthria and anarthria: Secondary | ICD-10-CM | POA: Diagnosis present

## 2018-06-14 DIAGNOSIS — I6522 Occlusion and stenosis of left carotid artery: Secondary | ICD-10-CM | POA: Diagnosis present

## 2018-06-14 DIAGNOSIS — I340 Nonrheumatic mitral (valve) insufficiency: Secondary | ICD-10-CM

## 2018-06-14 DIAGNOSIS — E669 Obesity, unspecified: Secondary | ICD-10-CM | POA: Diagnosis present

## 2018-06-14 DIAGNOSIS — J449 Chronic obstructive pulmonary disease, unspecified: Secondary | ICD-10-CM | POA: Diagnosis present

## 2018-06-14 DIAGNOSIS — J9610 Chronic respiratory failure, unspecified whether with hypoxia or hypercapnia: Secondary | ICD-10-CM | POA: Diagnosis present

## 2018-06-14 DIAGNOSIS — Z23 Encounter for immunization: Secondary | ICD-10-CM | POA: Diagnosis not present

## 2018-06-14 LAB — GLUCOSE, CAPILLARY
GLUCOSE-CAPILLARY: 183 mg/dL — AB (ref 70–99)
Glucose-Capillary: 112 mg/dL — ABNORMAL HIGH (ref 70–99)
Glucose-Capillary: 206 mg/dL — ABNORMAL HIGH (ref 70–99)
Glucose-Capillary: 184 mg/dL — ABNORMAL HIGH (ref 70–99)
Glucose-Capillary: 116 mg/dL — ABNORMAL HIGH (ref 70–99)
Glucose-Capillary: 212 mg/dL — ABNORMAL HIGH (ref 70–99)

## 2018-06-14 LAB — LIPID PANEL
CHOL/HDL RATIO: 4.2 ratio
Cholesterol: 139 mg/dL (ref 0–200)
HDL: 33 mg/dL — ABNORMAL LOW (ref >40)
LDL Cholesterol: 79 mg/dL (ref 0–99)
Triglycerides: 135 mg/dL (ref <150)
VLDL: 27 mg/dL (ref 0–40)

## 2018-06-14 LAB — TROPONIN I: Troponin I: 0.14 ng/mL (ref <0.03)

## 2018-06-14 LAB — HEMOGLOBIN A1C
Hgb A1c MFr Bld: 7.8 % — ABNORMAL HIGH (ref 4.8–5.6)
Mean Plasma Glucose: 177.16 mg/dL

## 2018-06-14 LAB — ECHOCARDIOGRAM COMPLETE
Height: 71 in
Weight: 3463.87 oz

## 2018-06-14 MED ORDER — TORSEMIDE 20 MG PO TABS
100.0000 mg | ORAL_TABLET | Freq: Two times a day (BID) | ORAL | Status: DC
  Administered 2018-06-14 – 2018-06-15 (×2): 100 mg via ORAL
  Filled 2018-06-14 (×2): qty 5

## 2018-06-14 MED ORDER — FUROSEMIDE 40 MG PO TABS: 60.0000 mg | ORAL_TABLET | Freq: Two times a day (BID) | ORAL | Status: DC

## 2018-06-14 NOTE — Care Management Note (Signed)
Case Management Note  Patient Details  Name: [name] MRN: [id number] Date of Birth: 10/09/1955  Subjective/Objective:     Admitted to [facility] under observation status with the diagnosis of slurred speech. Lives with wife. Last seen Dr. Jeanie CooksLeslie [facility]Bryant [facility] about a month ago. [facility].  Home health per [facility] in the past. [facility] about 5 years ago. No home oxygen. States he has used auto PAP x 1 year. Cane and rolling walker in the home, Takes care of all basic activities of daily living himself, doesn't drive. Wife does errands. Last fall was about 6 months ago. States he has lost 60 pounds since February (fluid). Wife will transport              Action/Plan: Occupational/physical, and speech therapy pending.   Expected Discharge Date:                  Expected Discharge Plan:     In-House Referral:   yes  Discharge planning Services   yes  Post Acute Care Choice:    Choice offered to:     DME Arranged:    DME Agency:     HH Arranged:    HH Agency:     Status of Service:     If discussed at [employer] Length of Stay Meetings, dates discussed:    Additional Comments:  Gwenette GreetBrenda S Merryn Thaker, RN MSN CCM Care Management [phone] 06/14/2018, 8:28 AM

## 2018-06-14 NOTE — Progress Notes (Signed)
SLP Cancellation Note  Patient Details Name: James CluckSamuel Harrington MRN: [id number] DOB: 1955/12/30   Cancelled treatment:       Reason Eval/Treat Not Completed: SLP screened, no needs identified, will sign off; Chart reviewed. Patient screened. No apparent evidence of dysarthria, dysphagia, aphasia, or cognitive linguistic deficits at bedside. Oral mech exam WFL, noted mild to mod left lingual deviation; however spontaneous speech was observed to be 100% intelligible. Encouraged pt to notify nsg/MD with any changes in his speech that would require reassessment.  Pt reports toleration of current diet with no s/s aspiration. Nursing confirms toleration of current regular heart healthy diet with thin liquids with no s/s apiration. No needs identified, SLP to sign off at this time, please re-consult with any future change in status that requires reassessment.   Kynzee Devinney, MA, CCC-SLP 06/14/2018, 9:19 AM

## 2018-06-14 NOTE — Evaluation (Signed)
Occupational Therapy Evaluation Patient Details Name: James CluckSamuel Andujar MRN: [id number] DOB: January 08, 1956 Today's Date: 06/14/2018    History of Present Illness 62yo male pt w/ PMHx including CAD, HLD, HTN, COPD, DM2, Afib, and CHF who presented to ED on 06/13/18 with slurred speech. CT-, MRI + for small patchy acute to subacute cortical and white matter infarcts in the bilateral cerebrum.   Clinical Impression   Pt seen for OT evaluation this date. Prior to hospital admission, pt was mod I using rollator for ambulation, and indep with basic ADL, Does not drive.  Pt lives with his spouse who can provide 24/7 assist if needed.  Currently pt demonstrates impairments in L sided strength (4/5), impaired balance, and poor activity tolerance requiring CGA-Min A for LB ADL. Pt would benefit from skilled OT while hospitalized to address noted impairments and functional limitations (see below for any additional details) in order to maximize safety and independence while minimizing falls risk and caregiver burden.  Upon hospital discharge, no further OT needs indicated at discharge.    Follow Up Recommendations  No OT follow up    Equipment Recommendations  None recommended by OT    Recommendations for Other Services       Precautions / Restrictions Precautions Precautions: Fall Restrictions Weight Bearing Restrictions: No      Mobility Bed Mobility Overal bed mobility: Needs Assistance Bed Mobility: Supine to Sit;Sit to Supine     Supine to sit: Supervision Sit to supine: Supervision      Transfers Overall transfer level: Needs assistance Equipment used: 1 person hand held assist Transfers: Sit to/from Stand Sit to Stand: Min guard              Balance Overall balance assessment: History of Falls;Needs assistance Sitting-balance support: Single extremity supported;Feet supported Sitting balance-Leahy Scale: Fair     Standing balance support: Single extremity  supported Standing balance-Leahy Scale: Fair                             ADL either performed or assessed with clinical judgement   ADL Overall ADL's : Needs assistance/impaired                                       General ADL Comments: Sup to CGA for sit<>stand LB ADL tasks, no overt LOB noted     Vision Baseline Vision/History: Wears glasses Wears Glasses: At all times Patient Visual Report: No change from baseline       Perception     Praxis      Pertinent Vitals/Pain Pain Assessment: 0-10 Pain Score: 10-Worst pain ever Pain Location: B feet  Pain Descriptors / Indicators: Aching;Tingling;Numbness Pain Intervention(s): Limited activity within patient's tolerance;Monitored during session;Repositioned     Hand Dominance Right   Extremity/Trunk Assessment Upper Extremity Assessment Upper Extremity Assessment: Overall WFL for tasks assessed;LUE deficits/detail LUE Deficits / Details: grossly 4/5, coordination and sensation intact LUE Sensation: WNL LUE Coordination: WNL   Lower Extremity Assessment Lower Extremity Assessment: Defer to PT evaluation;[facility] tasks assessed;LLE deficits/detail(bilat feet with mild swelling, and numb/tingly per pt (not new)) LLE Deficits / Details: grossly 4/5, coordination and sensation intact LLE Sensation: WNL LLE Coordination: WNL   Cervical / Trunk Assessment Cervical / Trunk Assessment: Normal   Communication Communication Communication: Expressive difficulties(slurred speech)   Cognition Arousal/Alertness: Awake/alert Behavior During Therapy:  WFL for tasks assessed/performed Overall Cognitive Status: Within Functional Limits for tasks assessed                                 General Comments: A&Ox4, follows all commands   General Comments  pt with mild swelling of B feet with pain/numbness/tingling, pt reports has been going on for some time    Exercises Other  Exercises Other Exercises: pt educated in falls prevention strategies   Shoulder Instructions      Home Living Family/patient expects to be discharged to:: Private residence Living Arrangements: Spouse/significant other Available Help at Discharge: Family;Available 24 hours/day Type of Home: Apartment(handicap accessible) Home Access: Level entry     Home Layout: One level     Bathroom Shower/Tub: Producer, television/film/video: Handicapped height     Home Equipment: Cane - single point;Other (comment);Grab bars - tub/shower;Shower seat(rollator)          Prior Functioning/Environment Level of Independence: Independent with assistive device(s)        Comments: pt amb w/ rollator at baseline, indep with ADL, does not drive but able to run errands with spouse. Limited act tol. 2 falls/37mo 2/2 "passing out"        OT Problem List: Decreased strength;Decreased knowledge of use of DME or AE;Decreased activity tolerance;Impaired sensation;Impaired balance (sitting and/or standing)      OT Treatment/Interventions: Self-care/ADL training;Balance training;Therapeutic exercise;Neuromuscular education;Therapeutic activities;DME and/or AE instruction;Patient/family education;Energy conservation    OT Goals(Current goals can be found in the care plan section) Acute Rehab OT Goals Patient Stated Goal: to go home  OT Goal Formulation: With patient Time For Goal Achievement: 06/28/18 Potential to Achieve Goals: Good  OT Frequency: Min 1X/week   Barriers to D/C:            Co-evaluation              AM-PAC OT "6 Clicks" Daily Activity     Outcome Measure Help from another person eating meals?: None Help from another person taking care of personal grooming?: None Help from another person toileting, which includes using toliet, bedpan, or urinal?: None Help from another person bathing (including washing, rinsing, drying)?: A Little Help from another person to put on  and taking off regular upper body clothing?: None Help from another person to put on and taking off regular lower body clothing?: A Little 6 Click Score: 22   End of Session    Activity Tolerance: Patient tolerated treatment well Patient left: in bed;with call bell/phone within reach;with bed alarm set  OT Visit Diagnosis: Other abnormalities of gait and mobility (R26.89);Repeated falls (R29.6);Hemiplegia and hemiparesis Hemiplegia - Right/Left: Left Hemiplegia - dominant/non-dominant: Non-Dominant Hemiplegia - caused by: Cerebral infarction                Time: 1610-9604 OT Time Calculation (min): 17 min Charges:  OT General Charges $OT Visit: 1 Visit OT Evaluation $OT Eval Low Complexity: 1 Low  Richrd Prime, MPH, MS, OTR/L ascom [phone] 06/14/18, 3:09 PM

## 2018-06-14 NOTE — Care Management Obs Status (Signed)
MEDICARE OBSERVATION STATUS NOTIFICATION   Patient Details  Name: [name] MRN: [id number] Date of Birth: 1956/05/09   Medicare Observation Status Notification Given:  Yes    Gwenette GreetBrenda S Anisha Starliper, RN 06/14/2018, 8:18 AM

## 2018-06-14 NOTE — Progress Notes (Signed)
OT Cancellation Note  Patient Details Name: James CluckSamuel Mangel MRN: [id number] DOB: 02-17-1956   Cancelled Treatment:    Reason Eval/Treat Not Completed: Other (comment). Order received, chart reviewed. Pt pending MRI and vascular surgery consult. Troponin also noted to be .14 (per ED MD note, likely demand ischemia). Will hold OT evaluation at this time and re-attempt once MRI and vascular consult have been performed to better inform pt's POC. Will continue to follow acutely.   Richrd PrimeJamie Stiller, MPH, MS, OTR/L ascom [phone] 06/14/18, 9:39 AM

## 2018-06-14 NOTE — Evaluation (Signed)
Physical Therapy Evaluation Patient Details Name: [name] MRN: [id number] DOB: 12-25-1955 Today's Date: 06/14/2018   History of Present Illness  62yo male pt w/ PMHx including CAD, HLD, HTN, COPD, DM2, Afib, and CHF who presented to ED on 06/13/18 with slurred speech. CT-, MRI + for small patchy acute to subacute cortical and white matter infarcts in the bilateral cerebrum.    Clinical Impression  Pt admitted with above diagnosis. Pt currently with functional limitations due to the deficits listed below (see PT Problem List). [name] presents with LLE strength grossly 4/5 but otherwise sensation baseline and coordination WNL.  Pt reports dizziness at baseline with dizziness as high as 7/10 this session in standing.  VSS throughout session with negative orthostatics. Pt will benefit from skilled PT to increase their independence and safety with mobility to allow discharge to the venue listed below.      Follow Up Recommendations Home health PT    Equipment Recommendations  None recommended by PT    Recommendations for Other Services       Precautions / Restrictions Precautions Precautions: Fall Restrictions Weight Bearing Restrictions: No      Mobility  Bed Mobility Overal bed mobility: Needs Assistance Bed Mobility: Supine to Sit     Supine to sit: Supervision;HOB elevated Sit to supine: Supervision   General bed mobility comments: Supervision for safety but no physical assist or cues needed  Transfers Overall transfer level: Needs assistance Equipment used: Rolling walker (2 wheeled) Transfers: Sit to/from Stand Sit to Stand: Min guard         General transfer comment: Pt uses momentum to stand with min instability but no LOB.  Cues for safe hand placement.   Ambulation/Gait Ambulation/Gait assistance: Min guard Gait Distance (Feet): 40 Feet Assistive device: Rolling walker (2 wheeled) Gait Pattern/deviations: Decreased step length - right;Decreased  step length - left;Trunk flexed Gait velocity: decreased   General Gait Details: Trunk flexed and distance limited by student PT as pt reporting dizziness.  No unsteadiness noted.   Stairs            Wheelchair Mobility    Modified Rankin (Stroke Patients Only)       Balance Overall balance assessment: Needs assistance;History of Falls Sitting-balance support: No upper extremity supported;Feet supported Sitting balance-Leahy Scale: Good     Standing balance support: Single extremity supported;During functional activity Standing balance-Leahy Scale: Poor Standing balance comment: Pt with support from RW with static and dynamic activity                             Pertinent Vitals/Pain Pain Assessment: Faces Pain Score: 10-Worst pain ever Faces Pain Scale: Hurts even more Pain Location: B feet (per pt, this is baseline) Pain Descriptors / Indicators: Aching;Tingling;Numbness Pain Intervention(s): Limited activity within patient's tolerance;Monitored during session    Home Living Family/patient expects to be discharged to:: Private residence Living Arrangements: Spouse/significant other Available Help at Discharge: Family;Available 24 hours/day Type of Home: Apartment(handicap accessible) Home Access: Level entry     Home Layout: One level Home Equipment: Cane - single point;Other (comment);Grab bars - tub/shower;Shower seat(rollator)      Prior Function Level of Independence: Independent with assistive device(s)         Comments: pt amb w/ rollator at baseline, indep with ADL, does not drive but able to run errands with spouse. Limited act tol. 2 falls/27mo 2/2 "passing out"     Hand Dominance  Dominant Hand: Right    Extremity/Trunk Assessment   Upper Extremity Assessment Upper Extremity Assessment: Defer to OT evaluation LUE Deficits / Details: grossly 4/5, coordination and sensation intact LUE Sensation: WNL LUE Coordination: WNL     Lower Extremity Assessment Lower Extremity Assessment: LLE deficits/detail LLE Deficits / Details: grossly 4/5, coordination intact.  Pt reports h/o numbness and tingling as well as pain dorsal surface of Bil feet.  LLE Sensation: WNL LLE Coordination: WNL    Cervical / Trunk Assessment Cervical / Trunk Assessment: Normal  Communication   Communication: Expressive difficulties(slurred speech)  Cognition Arousal/Alertness: Awake/alert Behavior During Therapy: WFL for tasks assessed/performed Overall Cognitive Status: Within Functional Limits for tasks assessed                                 General Comments: A&Ox4, follows all commands      General Comments General comments (skin integrity, edema, etc.): VSS throughout session. Pt reports dizziness at baseline with 7/10 as the highest report with standing this session.  Orthostatics negative.     Exercises Other Exercises Other Exercises: Encouraged pt to ambulate with nursing staff at least 3x/day  Other Exercises: Reviewed signs and symptoms of a stroke and what to do if these are noted   Assessment/Plan    PT Assessment Patient needs continued PT services  PT Problem List Decreased strength;Decreased activity tolerance;Decreased balance;Decreased safety awareness;Decreased knowledge of use of DME;Pain       PT Treatment Interventions DME instruction;Gait training;Functional mobility training;Therapeutic exercise;Therapeutic activities;Balance training;Neuromuscular re-education;Patient/family education    PT Goals (Current goals can be found in the Care Plan section)  Acute Rehab PT Goals Patient Stated Goal: to go home  PT Goal Formulation: With patient Time For Goal Achievement: 06/28/18 Potential to Achieve Goals: Good    Frequency 7X/week   Barriers to discharge        Co-evaluation               AM-PAC PT "6 Clicks" Mobility  Outcome Measure Help needed turning from your back to  your side while in a flat bed without using bedrails?: None Help needed moving from lying on your back to sitting on the side of a flat bed without using bedrails?: A Little Help needed moving to and from a bed to a chair (including a wheelchair)?: A Little Help needed standing up from a chair using your arms (e.g., wheelchair or bedside chair)?: A Little Help needed to walk in hospital room?: A Little Help needed climbing 3-5 steps with a railing? : A Little 6 Click Score: 19    End of Session Equipment Utilized During Treatment: Gait belt Activity Tolerance: Patient tolerated treatment well(although limited amb distance due to dizziness) Patient left: in chair;with call bell/phone within reach;with chair alarm set Nurse Communication: Mobility status;Other (comment)(pt's 7/10 dizziness) PT Visit Diagnosis: Unsteadiness on feet (R26.81);Muscle weakness (generalized) (M62.81)    Time: [id number] PT Time Calculation (min) (ACUTE ONLY): 35 min   Charges:   PT Evaluation $PT Eval Moderate Complexity: 1 Mod PT Treatments $Therapeutic Activity: 8-22 mins        Session was performed by student PT, Lisbeth RenshawShane Courtney, and directed, overseen, and documented by this PT.  Encarnacion ChuAshley Abashian PT, DPT 06/14/2018, 4:35 PM

## 2018-06-14 NOTE — Consult Note (Signed)
[facility] Vascular Consult Note  MRN : [id number]  [name] is a 62 y.o. (1955-07-27) male who presents with chief complaint of  Chief Complaint  Patient presents with  . Aphasia   History of Present Illness:  The patient is a 62 year old male with a past medical history of COPD, CAD, hyperlipidemia, past tobacco abuse, hypertension, chronic atrial fibrillation on chronic oral anticoagulation, diabetes type 2, TIA, carotid stenosis, CHF who presented to the [facility] emergency department and diagnosed with an acute CVA.   The patient endorses a history of watching TV yesterday afternoon and experiencing a sudden onset of slurred speech and right-sided facial droop.  The patient symptoms have not improved since admission.  The patient notes that he has been diagnosed with atrial fibrillation and takes Xarelto on a daily basis.  The patient denies experiencing any temporary strokelike symptoms or visual disturbances in the past.  Patient denies any recent headaches.  Patient denies any fever, nausea vomiting.  Denies any shortness of breath or chest pain.  CTA Neck (06/13/18): Severe calcified and noncalcified plaque at the LEFT carotid bifurcation, extending into the proximal ICA. Estimated 80-90% stenosis. 2. Non stenotic atheromatous change RIGHT carotid bifurcation, estimated 60% stenosis. 3. No intracranial flow-limiting stenosis or large vessel occlusion. Significant calcified plaque both distal cavernous carotid arteries, estimated 50-75% stenosis bilaterally. 4. Poor dentition, with numerous missing teeth, dental caries, and periapical lucencies.  MRI Brain: (06/14/18): 1. Small patchy acute to subacute cortical and white matter infarcts in the bilateral cerebrum. 2. Background of mild chronic small vessel ischemia.  Consulted by Dr. Elpidio Anis for possible repair  Current Facility-Administered Medications  Medication Dose Route  Frequency Provider Last Rate Last Dose  . atorvastatin (LIPITOR) tablet 40 mg  40 mg Oral Daily Crosley, Debby, MD   40 mg at 06/14/18 0831  . carvedilol (COREG) tablet 25 mg  25 mg Oral BID WC Crosley, Debby, MD   25 mg at 06/14/18 0830  . insulin aspart (novoLOG) injection 0-15 Units  0-15 Units Subcutaneous TID WC Gery Pray, MD   3 Units at 06/14/18 1253  . insulin aspart (novoLOG) injection 0-5 Units  0-5 Units Subcutaneous QHS Gery Pray, MD   2 Units at 06/13/18 2238  . ipratropium-albuterol (DUONEB) 0.5-2.5 (3) MG/3ML nebulizer solution 3 mL  3 mL Inhalation Q4H PRN Crosley, Debby, MD      . mometasone-formoterol (DULERA) 200-5 MCG/ACT inhaler 2 puff  2 puff Inhalation BID Gery Pray, MD   2 puff at 06/14/18 0832  . nitroGLYCERIN (NITROSTAT) SL tablet 0.4 mg  0.4 mg Sublingual Q5 min PRN Crosley, Debby, MD      . pneumococcal 23 valent vaccine (PNU-IMMUNE) injection 0.5 mL  0.5 mL Intramuscular Tomorrow-1000 Crosley, Debby, MD      . potassium chloride SA (K-DUR,KLOR-CON) CR tablet 20 mEq  20 mEq Oral Daily Crosley, Debby, MD   20 mEq at 06/14/18 0830  . rivaroxaban (XARELTO) tablet 20 mg  20 mg Oral Q supper Crosley, Debby, MD      . senna-docusate (Senokot-S) tablet 1 tablet  1 tablet Oral QHS PRN Crosley, Debby, MD      . spironolactone (ALDACTONE) tablet 25 mg  25 mg Oral Daily Crosley, Debby, MD   25 mg at 06/14/18 0831  . torsemide (DEMADEX) tablet 100 mg  100 mg Oral BID Sudini, Wardell Heath, MD      . traZODone (DESYREL) tablet 50 mg  50 mg Oral QHS  Gery Pray, MD   50 mg at 06/13/18 2221   Past Medical History:  Diagnosis Date  . Atrial fibrillation (HCC)   . CAD in native artery    a. stress echo 12/2007 abnl, EF > 55%, b. LHC 01/28/08: mLAD 30, D1 40, dLCx 70, pRCA 30, mRCA 70, mRCA lesion 2 80, PDA 90, s/p PCI/BMS to prox and distal RCA, s/p PCI/BMS to PDA; c. patient reports PCI/stenting x 2 in early 2017 at the [place] (no records on file) d. 06/2016: cath showing patent  stents along RCA and LCx with moderate 40% stenosis along the LAD.   [name] Chronic combined systolic (congestive) and diastolic (congestive) heart failure (HCC)    a. echo 2009: > 55% b. 06/2016: EF 25-30% by echo in 06/2016, cath showing patent stents with moderate disease along LAD  . COPD (chronic obstructive pulmonary disease) (HCC)   . Diabetes mellitus without complication (HCC)   . Gout   . Hyperlipidemia   . Hypertension   . Hypertensive heart disease   . Obesity   . Tobacco abuse    Past Surgical History:  Procedure Laterality Date  . CARDIAC CATHETERIZATION  2009   [facility];   . CARDIAC CATHETERIZATION  2010  . CARDIAC CATHETERIZATION N/A 07/28/2016   Procedure: Right and Left Heart Cath and possible PCI;  Surgeon: Iran Ouch, MD;  Location: [facility] INVASIVE CV LAB;  Service: Cardiovascular;  Laterality: N/A;  . CARDIOVERSION N/A 03/27/2017   Procedure: CARDIOVERSION;  Surgeon: Iran Ouch, MD;  Location: [facility] ORS;  Service: Cardiovascular;  Laterality: N/A;  . CORONARY ANGIOPLASTY  2009   s/p stent placement at [facility].   Social History Social History   Tobacco Use  . Smoking status: Former Smoker    Packs/day: 1.00    Years: 35.00    Pack years: 35.00    Types: Cigarettes  . Smokeless tobacco: Never Used  Substance Use Topics  . Alcohol use: Not Currently    Comment: Quit 8 yrs.  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 Drug use: No   Family History Family History  Problem Relation Age of Onset  . Heart attack Mother 67  Denies family history of peripheral artery disease, venous disease and or clotting/bleeding disorders.  Allergies  Allergen Reactions  . Aspirin Other (See Comments)    Told not to take because of blood thinner  . Ibuprofen Nausea And Vomiting  . Tylenol [Acetaminophen] Nausea And Vomiting   REVIEW OF SYSTEMS (Negative unless checked)  Constitutional: [] Weight loss  [] Fever  [] Chills Cardiac: [] Chest pain   [] Chest pressure   [] Palpitations    [] Shortness of breath when laying flat   [] Shortness of breath at rest   [] Shortness of breath with exertion. Vascular:  [] Pain in legs with walking   [] Pain in legs at rest   [] Pain in legs when laying flat   [] Claudication   [] Pain in feet when walking  [] Pain in feet at rest  [] Pain in feet when laying flat   [] History of DVT   [] Phlebitis   [] Swelling in legs   [] Varicose veins   [] Non-healing ulcers Pulmonary:   [] Uses home oxygen   [] Productive cough   [] Hemoptysis   [] Wheeze  [] COPD   [] Asthma Neurologic:  [] Dizziness  [] Blackouts   [] Seizures   [x] History of stroke   [x] History of TIA  [x] Aphasia   [] Temporary blindness   [] Dysphagia   [] Weakness or numbness in arms   [] Weakness or numbness in legs Musculoskeletal:  []   Arthritis   [] Joint swelling   [] Joint pain   [] Low back pain Hematologic:  [] Easy bruising  [] Easy bleeding   [] Hypercoagulable state   [] Anemic  [] Hepatitis Gastrointestinal:  [] Blood in stool   [] Vomiting blood  [] Gastroesophageal reflux/heartburn   [] Difficulty swallowing. Genitourinary:  [] Chronic kidney disease   [] Difficult urination  [] Frequent urination  [] Burning with urination   [] Blood in urine Skin:  [] Rashes   [] Ulcers   [] Wounds Psychological:  [] History of anxiety   []  History of major depression.  Physical Examination  Vitals:   06/14/18 0406 06/14/18 0652 06/14/18 0830 06/14/18 1015  BP: (!) 176/123 (!) 148/114 (!) 136/114 102/62  Pulse: 83 60 84 74  Resp: 18 15 18    Temp: 98.1 F (36.7 C)  98 F (36.7 C) 97.6 F (36.4 C)  TempSrc: Oral  Oral Oral  SpO2: 99% 97% 98% 97%  Weight:      Height:       Body mass index is 30.19 kg/m. Gen:  WD/WN, NAD, Continued slurred speech Face: Slight right facial droop noted especially to right mouth.  Head: [place]/AT, No temporalis wasting. Prominent temp pulse not noted. Ear/Nose/Throat: Hearing grossly intact, nares w/o erythema or drainage, oropharynx w/o Erythema/Exudate Eyes: Sclera non-icteric, conjunctiva  clear Neck: Trachea midline.  No JVD.  Pulmonary:  Good air movement, respirations not labored, equal bilaterally.  Cardiac: Irregularly irregular Vascular:  Vessel Right Left  Radial Palpable Palpable  Ulnar Palpable Palpable  Brachial Palpable Palpable  Carotid Palpable, without bruit Palpable, without bruit  Aorta Not palpable N/A  Femoral Palpable Palpable  Popliteal Palpable Palpable  PT Palpable Palpable  DP Palpable Palpable   Gastrointestinal: soft, non-tender/non-distended. No guarding/reflex.  Musculoskeletal: M/S 5/5 throughout. No neurological deficients noted to extremities. Extremities without ischemic changes.  No deformity or atrophy. No edema. Neurologic: Sensation grossly intact in extremities. Motor exam as listed above. Psychiatric: Judgment intact, Mood & affect appropriate for pt's clinical situation. Dermatologic: No rashes or ulcers noted.  No cellulitis or open wounds. Lymph : No Cervical, Axillary, or Inguinal lymphadenopathy.  CBC Lab Results  Component Value Date   WBC 6.8 06/13/2018   HGB 14.6 06/13/2018   HCT 46.7 06/13/2018   MCV 96.7 06/13/2018   PLT 228 06/13/2018   BMET    Component Value Date/Time   NA 140 06/13/2018 1308   NA 148 (H) 12/21/2012 1123   NA 141 12/11/2012 1214   K 3.9 06/13/2018 1308   K 4.2 12/11/2012 1214   CL 102 06/13/2018 1308   CL 108 (H) 12/11/2012 1214   CO2 25 06/13/2018 1308   CO2 28 12/11/2012 1214   GLUCOSE 135 (H) 06/13/2018 1308   GLUCOSE 112 (H) 12/11/2012 1214   BUN 20 06/13/2018 1308   BUN 16 12/21/2012 1123   BUN 18 12/11/2012 1214   CREATININE 1.08 06/13/2018 1308   CREATININE 1.26 12/11/2012 1214   CALCIUM 9.2 06/13/2018 1308   CALCIUM 8.8 12/11/2012 1214   GFRNONAA >60 06/13/2018 1308   GFRNONAA >60 12/11/2012 1214   GFRAA >60 06/13/2018 1308   GFRAA >60 12/11/2012 1214   Estimated Creatinine Clearance: 85.8 mL/min (by C-G formula based on SCr of 1.08 mg/dL).  COAG Lab Results   Component Value Date   INR 2.47 06/13/2018   INR 1.14 02/18/2017   INR 1.19 01/07/2017   Radiology Ct Angio Head W Or Wo Contrast  Result Date: 06/13/2018 CLINICAL DATA:  Acute onset dysarthria earlier today. LEFT facial droop. Atrial fibrillation,  on Xarelto. EXAM: CT ANGIOGRAPHY HEAD AND NECK TECHNIQUE: Multidetector CT imaging of the head and neck was performed using the standard protocol during bolus administration of intravenous contrast. Multiplanar CT image reconstructions and MIPs were obtained to evaluate the vascular anatomy. Carotid stenosis measurements (when applicable) are obtained utilizing NASCET criteria, using the distal internal carotid diameter as the denominator. CONTRAST:  75mL ISOVUE-370 IOPAMIDOL (ISOVUE-370) INJECTION 76% COMPARISON:  Code stroke CT earlier today was negative. FINDINGS: CTA NECK FINDINGS Aortic arch: Standard branching. Imaged portion shows no evidence of aneurysm or dissection. No significant stenosis of the major arch vessel origins. Aortic atherosclerosis. Right carotid system: Moderate soft plaque distal CCA, nonstenotic. Moderate to severe calcified and noncalcified plaque at the bifurcation extending into the proximal RIGHT ICA. Estimated 60% ICA stenosis based on luminal measurements of 1.6/4.1 proximal/distal. Cervical ICA patent. No dissection. Left carotid system: Minor soft plaque in the distal common carotid artery, nonstenotic. Severe calcified and noncalcified plaque at the bifurcation, extending into the proximal ICA. Estimated flow-limiting 80-90% stenosis based on luminal measurements of 0.9/4.5 proximal/distal. Cervical ICA patent. No dissection. Vertebral arteries: Nonstenotic ostial plaque at both vertebral origins. Nonstenotic calcification of both proximal vertebral arteries which are patent, RIGHT slightly larger. Skeleton: Advanced cervical spondylosis, disc space narrowing most pronounced C4-5, C5-6, and C6-7. Poor dentition, numerous  missing teeth, dental caries and periapical lucencies. Other neck: Unremarkable. Upper chest: No mass, pneumothorax, or effusion. Review of the MIP images confirms the above findings CTA HEAD FINDINGS Anterior circulation: Heavily calcified carotid siphons bilaterally, estimated 50-75% stenoses in the BILATERAL distal cavernous segments. ICA termini patent. No flow-limiting stenosis of the anterior or middle cerebral arteries. No MCA branch occlusion, although mild irregularity of the distal MCA branches is observed bilaterally. Posterior circulation: Dominant RIGHT vertebral supplies the basilar. Calcified stenosis of the LEFT vertebral estimated 50-75% in its V4 segment. Basilar artery is mildly irregular, and mildly hypoplastic due to fetal LEFT PCA. No flow-limiting PCA stenosis or cerebellar branch occlusion although the LEFT PCA appears slightly more irregular than the RIGHT. Venous sinuses: As permitted by contrast timing, patent. Anatomic variants: Fetal LEFT PCA. Delayed phase: Not performed. Review of the MIP images confirms the above findings IMPRESSION: 1. Severe calcified and noncalcified plaque at the LEFT carotid bifurcation, extending into the proximal ICA. Estimated 80-90% stenosis. 2. Non stenotic atheromatous change RIGHT carotid bifurcation, estimated 60% stenosis. 3. No intracranial flow-limiting stenosis or large vessel occlusion. Significant calcified plaque both distal cavernous carotid arteries, estimated 50-75% stenosis bilaterally. 4. Poor dentition, with numerous missing teeth, dental caries, and periapical lucencies. Electronically Signed   By: Elsie StainJohn T Curnes M.D.   On: 06/13/2018 14:44   Ct Angio Neck W Or Wo Contrast  Result Date: 06/13/2018 CLINICAL DATA:  Acute onset dysarthria earlier today. LEFT facial droop. Atrial fibrillation, on Xarelto. EXAM: CT ANGIOGRAPHY HEAD AND NECK TECHNIQUE: Multidetector CT imaging of the head and neck was performed using the standard protocol  during bolus administration of intravenous contrast. Multiplanar CT image reconstructions and MIPs were obtained to evaluate the vascular anatomy. Carotid stenosis measurements (when applicable) are obtained utilizing NASCET criteria, using the distal internal carotid diameter as the denominator. CONTRAST:  75mL ISOVUE-370 IOPAMIDOL (ISOVUE-370) INJECTION 76% COMPARISON:  Code stroke CT earlier today was negative. FINDINGS: CTA NECK FINDINGS Aortic arch: Standard branching. Imaged portion shows no evidence of aneurysm or dissection. No significant stenosis of the major arch vessel origins. Aortic atherosclerosis. Right carotid system: Moderate soft plaque distal CCA, nonstenotic. Moderate  to severe calcified and noncalcified plaque at the bifurcation extending into the proximal RIGHT ICA. Estimated 60% ICA stenosis based on luminal measurements of 1.6/4.1 proximal/distal. Cervical ICA patent. No dissection. Left carotid system: Minor soft plaque in the distal common carotid artery, nonstenotic. Severe calcified and noncalcified plaque at the bifurcation, extending into the proximal ICA. Estimated flow-limiting 80-90% stenosis based on luminal measurements of 0.9/4.5 proximal/distal. Cervical ICA patent. No dissection. Vertebral arteries: Nonstenotic ostial plaque at both vertebral origins. Nonstenotic calcification of both proximal vertebral arteries which are patent, RIGHT slightly larger. Skeleton: Advanced cervical spondylosis, disc space narrowing most pronounced C4-5, C5-6, and C6-7. Poor dentition, numerous missing teeth, dental caries and periapical lucencies. Other neck: Unremarkable. Upper chest: No mass, pneumothorax, or effusion. Review of the MIP images confirms the above findings CTA HEAD FINDINGS Anterior circulation: Heavily calcified carotid siphons bilaterally, estimated 50-75% stenoses in the BILATERAL distal cavernous segments. ICA termini patent. No flow-limiting stenosis of the anterior or  middle cerebral arteries. No MCA branch occlusion, although mild irregularity of the distal MCA branches is observed bilaterally. Posterior circulation: Dominant RIGHT vertebral supplies the basilar. Calcified stenosis of the LEFT vertebral estimated 50-75% in its V4 segment. Basilar artery is mildly irregular, and mildly hypoplastic due to fetal LEFT PCA. No flow-limiting PCA stenosis or cerebellar branch occlusion although the LEFT PCA appears slightly more irregular than the RIGHT. Venous sinuses: As permitted by contrast timing, patent. Anatomic variants: Fetal LEFT PCA. Delayed phase: Not performed. Review of the MIP images confirms the above findings IMPRESSION: 1. Severe calcified and noncalcified plaque at the LEFT carotid bifurcation, extending into the proximal ICA. Estimated 80-90% stenosis. 2. Non stenotic atheromatous change RIGHT carotid bifurcation, estimated 60% stenosis. 3. No intracranial flow-limiting stenosis or large vessel occlusion. Significant calcified plaque both distal cavernous carotid arteries, estimated 50-75% stenosis bilaterally. 4. Poor dentition, with numerous missing teeth, dental caries, and periapical lucencies. Electronically Signed   By: Elsie Stain M.D.   On: 06/13/2018 14:44   Mr Brain Wo Contrast  Result Date: 06/14/2018 CLINICAL DATA:  TIA, initial exam EXAM: MRI HEAD WITHOUT CONTRAST TECHNIQUE: Multiplanar, multiecho pulse sequences of the brain and surrounding structures were obtained without intravenous contrast. COMPARISON:  CTA head and neck from yesterday FINDINGS: Brain: Subcentimeter acute to subacute (based on subjective degree of restricted diffusion) infarcts in the medial right temporal white matter, right subinsular white matter, high and lateral right frontal cortex, and left frontal subcortical white matter. Mild presumed chronic small vessel ischemic type change in the cerebral white matter and pons. No hemorrhage, hydrocephalus, or masslike finding.  Vascular: Asymmetric lost flow void in the left vertebral artery on axial slices at the V3/4 junction, but normal appearing on coronal T2 weighted imaging and patent on CTA yesterday. Skull and upper cervical spine: Major flow voids are preserved Sinuses/Orbits: Negative IMPRESSION: 1. Small patchy acute to subacute cortical and white matter infarcts in the bilateral cerebrum. 2. Background of mild chronic small vessel ischemia. Electronically Signed   By: Marnee Spring M.D.   On: 06/14/2018 12:35   Ct Head Code Stroke Wo Contrast  Addendum Date: 06/13/2018   ADDENDUM REPORT: 06/13/2018 13:21 ADDENDUM: Study discussed by telephone with Dr. Nita Sickle on 06/13/2018 at 1312 hours. Electronically Signed   By: Odessa Fleming M.D.   On: 06/13/2018 13:21   Result Date: 06/13/2018 CLINICAL DATA:  Code stroke. 62 year old male with slurred speech onset at 1217 hours. EXAM: CT HEAD WITHOUT CONTRAST TECHNIQUE: Contiguous axial images were  obtained from the base of the skull through the vertex without intravenous contrast. COMPARISON:  None. FINDINGS: Brain: No midline shift, mass effect, or evidence of intracranial mass lesion. No acute intracranial hemorrhage identified. No ventriculomegaly. Gray-white matter differentiation is within normal limits for age. No cortically based acute infarct identified. Vascular: Calcified atherosclerosis at the skull base. No suspicious intracranial vascular hyperdensity. Skull: Negative. Sinuses/Orbits: Visualized paranasal sinuses and mastoids are well pneumatized. Other: Visualized orbit soft tissues are within normal limits. Negative scalp soft tissues. ASPECTS [facility] Stroke Program Early CT Score) - Ganglionic level infarction (caudate, lentiform nuclei, internal capsule, insula, M1-M3 cortex): 7 - Supraganglionic infarction (M4-M6 cortex): 3 Total score (0-10 with 10 being normal): 10 IMPRESSION: 1. Normal for age noncontrast CT appearance of the brain. 2. ASPECTS 10.  Electronically Signed: By: Odessa Fleming M.D. On: 06/13/2018 13:09   Assessment/Plan The patient is a 62 year old male with a past medical history of COPD, CAD, hyperlipidemia, past tobacco abuse, hypertension, chronic atrial fibrillation on chronic oral anticoagulation, diabetes type 2, TIA, carotid stenosis, CHF who presented to the [facility] emergency department and diagnosed with an acute CVA.  1. Carotid Stenosis: Reviewed CTA of the neck with Dr. Gilda Crease.  Patient with 80 to 90% stenosis to the left internal carotid artery.  Patient with approximately 75% stenosis to the right internal carotid artery.  Patient presents with right sided neurological deficits.  Patient's left internal carotid artery anatomy notable for not being highly calcified with the bulk of the narrowing noted around C2 with a high bifurcation.  Due to the patient's anatomy the preferred method of repair would be carotid stenting.  Since the patient has been diagnosed with an acute CVA we would wait approximately 2 weeks to move forward with the repair.  The patient will need to be cardiac cleared before the procedure.  Once the patient's left side is repaired in a few weeks we will turn our attention to the right.  The patient's anatomy, pathophysiology of carotid artery stenosis, procedure, risks and benefits were explained to the patient.  All questions were answered.  The patient wishes to proceed. 2. Hyperlipidemia: On statin. Allergic to aspirin. Encouraged good control as its slows the progression of atherosclerotic disease 3. Diabetes: On appropriate medications. Encouraged good control as its slows the progression of atherosclerotic disease 4. Hypertension: On appropriate medications. Encouraged good control as its slows the progression of atherosclerotic disease  If the patient can be cardiac cleared while he is an inpatient that would be very helpful moving forward in preparation for the patient's  upcoming left internal carotid artery stent procedure  Discussed with Dr. Romie Jumper, PA-C  06/14/2018 4:05 PM  This note was created with Dragon medical transcription system.  Any error is purely unintentional.

## 2018-06-14 NOTE — Plan of Care (Signed)
  Problem: Education: Goal: Knowledge of disease or condition will improve Outcome: Progressing Goal: Knowledge of secondary prevention will improve Outcome: Progressing Goal: Knowledge of patient specific risk factors addressed and post discharge goals established will improve Outcome: Progressing   Problem: Coping: Goal: Will verbalize positive feelings about self Outcome: Progressing Goal: Will identify appropriate support needs Outcome: Progressing   Problem: Health Behavior/Discharge Planning: Goal: Ability to manage health-related needs will improve Outcome: Progressing   Problem: Ischemic Stroke/TIA Tissue Perfusion: Goal: Complications of ischemic stroke/TIA will be minimized Outcome: Progressing   

## 2018-06-14 NOTE — Progress Notes (Addendum)
States he is feeling better   Past Medical History:  Diagnosis Date  . Atrial fibrillation (HCC)   . CAD in native artery    a. stress echo 12/2007 abnl, EF > 55%, b. LHC 01/28/08: mLAD 30, D1 40, dLCx 70, pRCA 30, mRCA 70, mRCA lesion 2 80, PDA 90, s/p PCI/BMS to prox and distal RCA, s/p PCI/BMS to PDA; c. patient reports PCI/stenting x 2 in early 2017 at the [place] (no records on file) d. 06/2016: cath showing patent stents along RCA and LCx with moderate 40% stenosis along the LAD.   [name] Chronic combined systolic (congestive) and diastolic (congestive) heart failure (HCC)    a. echo 2009: > 55% b. 06/2016: EF 25-30% by echo in 06/2016, cath showing patent stents with moderate disease along LAD  . COPD (chronic obstructive pulmonary disease) (HCC)   . Diabetes mellitus without complication (HCC)   . Gout   . Hyperlipidemia   . Hypertension   . Hypertensive heart disease   . Obesity   . Tobacco abuse     Past Surgical History:  Procedure Laterality Date  . CARDIAC CATHETERIZATION  2009   [facility];   . CARDIAC CATHETERIZATION  2010  . CARDIAC CATHETERIZATION N/A 07/28/2016   Procedure: Right and Left Heart Cath and possible PCI;  Surgeon: Iran Ouch, MD;  Location: [facility] INVASIVE CV LAB;  Service: Cardiovascular;  Laterality: N/A;  . CARDIOVERSION N/A 03/27/2017   Procedure: CARDIOVERSION;  Surgeon: Iran Ouch, MD;  Location: [facility] ORS;  Service: Cardiovascular;  Laterality: N/A;  . CORONARY ANGIOPLASTY  2009   s/p stent placement at [facility].    Family History  Problem Relation Age of Onset  . Heart attack Mother 25   Social History:  reports that he has quit smoking. His smoking use included cigarettes. He has a 35.00 pack-year smoking history. He has never used smokeless tobacco. He reports previous alcohol use. He reports that he does not use drugs.  Allergies:  Allergies  Allergen Reactions  . Aspirin Other (See Comments)    Told not to take because of blood thinner  .  Ibuprofen Nausea And Vomiting  . Tylenol [Acetaminophen] Nausea And Vomiting    Medications: I have reviewed the patient's current medications. Prior to Admission:  Prior to Admission medications   Medication Sig Start Date End Date Taking? Authorizing Provider  albuterol-ipratropium (COMBIVENT) 18-103 MCG/ACT inhaler Inhale 2 puffs into the lungs every 4 (four) hours as needed for wheezing.    [provider]  atorvastatin (LIPITOR) 40 MG tablet Take 40 mg by mouth daily.     [provider]  budesonide-formoterol (SYMBICORT) 160-4.5 MCG/ACT inhaler Inhale 2 puffs into the lungs 2 (two) times daily.    [provider]  carvedilol (COREG) 25 MG tablet Take 1 tablet (25 mg total) by mouth 2 (two) times daily with a meal. 07/31/16   Enedina Finner, MD  cephALEXin (KEFLEX) 500 MG capsule Take 1 capsule (500 mg total) by mouth every 12 (twelve) hours. 01/22/18   Shaune Pollack, MD  docusate sodium (COLACE) 100 MG capsule Take 100 mg by mouth 2 (two) times daily.    [provider]  losartan (COZAAR) 25 MG tablet Take 2 tablets (50 mg total) by mouth daily. 01/21/18   Shaune Pollack, MD  metFORMIN (GLUCOPHAGE) 1000 MG tablet Take 1,000 mg by mouth 2 (two) times daily with a meal.     [provider]  methocarbamol (ROBAXIN) 500 MG tablet Take  1 tablet (500 mg total) by mouth 4 (four) times daily. 12/17/17   Tommi Rumps, PA-C  nitroGLYCERIN (NITROSTAT) 0.4 MG SL tablet Place 0.4 mg under the tongue every 5 (five) minutes as needed for chest pain.    [provider]  oxyCODONE (OXY IR/ROXICODONE) 5 MG immediate release tablet Take 1 tablet (5 mg total) by mouth every 6 (six) hours as needed for severe pain. 12/17/17   Tommi Rumps, PA-C  potassium chloride SA (K-DUR,KLOR-CON) 20 MEQ tablet Take 1 tablet (20 mEq total) by mouth daily. 01/22/18   Shaune Pollack, MD  rivaroxaban (XARELTO) 20 MG TABS tablet Take 1 tablet (20 mg total) by mouth daily with supper.  02/19/17   Adrian Saran, MD  spironolactone (ALDACTONE) 25 MG tablet Take 1 tablet (25 mg total) by mouth daily. 08/01/16   Enedina Finner, MD  torsemide (DEMADEX) 20 MG tablet Take 2 tablets (40 mg total) by mouth 2 (two) times daily. 01/21/18   Shaune Pollack, MD  traZODone (DESYREL) 50 MG tablet Take 50 mg by mouth at bedtime.    [provider]    ROS: History obtained from the patient  General ROS: negative for - chills, fatigue, fever, night sweats, weight gain or weight loss Psychological ROS: negative for - behavioral disorder, hallucinations, memory difficulties, mood swings or suicidal ideation Ophthalmic ROS: negative for - blurry vision, double vision, eye pain or loss of vision ENT ROS: negative for - epistaxis, nasal discharge, oral lesions, sore throat, tinnitus or vertigo Allergy and Immunology ROS: negative for - hives or itchy/watery eyes Hematological and Lymphatic ROS: negative for - bleeding problems, bruising or swollen lymph nodes Endocrine ROS: negative for - galactorrhea, hair pattern changes, polydipsia/polyuria or temperature intolerance Respiratory ROS: negative for - cough, hemoptysis, shortness of breath or wheezing Cardiovascular ROS: negative for - chest pain, dyspnea on exertion, edema or irregular heartbeat Gastrointestinal ROS: negative for - abdominal pain, diarrhea, hematemesis, nausea/vomiting or stool incontinence Genito-Urinary ROS: negative for - dysuria, hematuria, incontinence or urinary frequency/urgency Musculoskeletal ROS: negative for - joint swelling or muscular weakness Neurological ROS: as noted in HPI Dermatological ROS: negative for rash and skin lesion changes  Physical Examination: Blood pressure (!) 121/97, pulse (!) 56, temperature 97.6 F (36.4 C), resp. rate 20, height 5\' 11"  (1.803 m), weight 98.2 kg, SpO2 99 %.   Neurological Examination   Mental Status: Alert, oriented, thought content appropriate.  Speech fluent without  evidence of aphasia.  Dysarthric.  Able to follow 3 step commands without difficulty. Cranial Nerves: II: Discs flat bilaterally; Visual fields grossly normal, pupils equal, round, reactive to light and accommodation III,IV, VI: ptosis not present, extra-ocular motions intact bilaterally V,VII: left facial droop, facial light touch sensation normal bilaterally VIII: hearing normal bilaterally IX,X: gag reflex present XI: bilateral shoulder shrug XII: midline tongue extension Motor: Right : Upper extremity   5/5    Left:     Upper extremity   5/5  Lower extremity   5/5     Lower extremity   5/5 Tone and bulk:normal tone throughout; no atrophy noted Sensory: Pinprick and light touch intact throughout, bilaterally Deep Tendon Reflexes: 2+ and symmetric with absent AJ's bilaterally Plantars: Right: downgoing   Left: downgoing Cerebellar: Normal finger-to-nose and normal heel-to-shin testing bilaterally Gait: not tested due to safety concerns    Laboratory Studies:  Basic Metabolic Panel: Recent Labs  Lab 06/13/18 1308  NA 140  K 3.9  CL 102  CO2 25  GLUCOSE 135*  BUN 20  CREATININE 1.08  CALCIUM 9.2    Liver Function Tests: Recent Labs  Lab 06/13/18 1308  AST 43*  ALT 43  ALKPHOS 199*  BILITOT 1.4*  PROT 7.5  ALBUMIN 3.8   No results for input(s): LIPASE, AMYLASE in the last 168 hours. No results for input(s): AMMONIA in the last 168 hours.  CBC: Recent Labs  Lab 06/13/18 1308  WBC 6.8  NEUTROABS 4.2  HGB 14.6  HCT 46.7  MCV 96.7  PLT 228    Cardiac Enzymes: Recent Labs  Lab 06/13/18 1308 06/13/18 2106 06/13/18 2349  TROPONINI 0.15* 0.16* 0.14*    BNP: Invalid input(s): POCBNP  CBG: Recent Labs  Lab 06/13/18 1310 06/13/18 2228 06/14/18 0732 06/14/18 1241 06/14/18 1648  GLUCAP 112* 206* 184* 183* 116*    Microbiology: Results for orders placed or performed during the hospital encounter of 01/19/18  Urine culture     Status: Abnormal    Collection Time: 01/19/18  5:28 PM  Result Value Ref Range Status   Specimen Description   Final    URINE, CLEAN CATCH Performed at [facility], [address] [place], [place] [address]    Special Requests   Final    NONE Performed at [facility], [address] [place], [place] [address]    Culture >=100,000 COLONIES/mL ESCHERICHIA COLI (A)  Final   Report Status 01/21/2018 FINAL  Final   Organism ID, Bacteria ESCHERICHIA COLI (A)  Final      Susceptibility   Escherichia coli - MIC*    AMPICILLIN <=2 SENSITIVE Sensitive     CEFAZOLIN <=4 SENSITIVE Sensitive     CEFTRIAXONE <=1 SENSITIVE Sensitive     CIPROFLOXACIN <=0.25 SENSITIVE Sensitive     GENTAMICIN <=1 SENSITIVE Sensitive     IMIPENEM <=0.25 SENSITIVE Sensitive     NITROFURANTOIN 32 SENSITIVE Sensitive     TRIMETH/SULFA <=20 SENSITIVE Sensitive     AMPICILLIN/SULBACTAM <=2 SENSITIVE Sensitive     PIP/TAZO <=4 SENSITIVE Sensitive     Extended ESBL NEGATIVE Sensitive     * >=100,000 COLONIES/mL ESCHERICHIA COLI    Coagulation Studies: Recent Labs    06/13/18 1308  LABPROT 26.4*  INR 2.47    Urinalysis:  Recent Labs  Lab 06/13/18 1623  COLORURINE YELLOW*  LABSPEC 1.033*  PHURINE 5.0  GLUCOSEU >=500*  HGBUR NEGATIVE  BILIRUBINUR NEGATIVE  KETONESUR NEGATIVE  PROTEINUR NEGATIVE  NITRITE NEGATIVE  LEUKOCYTESUR NEGATIVE    Lipid Panel:    Component Value Date/Time   CHOL 139 06/14/2018 0517   CHOL 191 12/09/2012 0439   TRIG 135 06/14/2018 0517   TRIG 78 12/09/2012 0439   HDL 33 (L) 06/14/2018 0517   HDL 54 12/09/2012 0439   CHOLHDL 4.2 06/14/2018 0517   VLDL 27 06/14/2018 0517   VLDL 16 12/09/2012 0439   LDLCALC 79 06/14/2018 0517   LDLCALC 121 (H) 12/09/2012 0439    HgbA1C:  Lab Results  Component Value Date   HGBA1C 7.8 (H) 06/14/2018    Urine Drug Screen:      Component Value Date/Time   LABOPIA NONE DETECTED 06/13/2018 1623   COCAINSCRNUR NONE DETECTED  06/13/2018 1623   LABBENZ NONE DETECTED 06/13/2018 1623   AMPHETMU NONE DETECTED 06/13/2018 1623   THCU NONE DETECTED 06/13/2018 1623   LABBARB NONE DETECTED 06/13/2018 1623    Alcohol Level: No results for input(s): ETH in the last 168 hours.  Other results: EKG: atrial fibrillation, rate  107 bpm.  Imaging: Ct Angio Head W Or Wo Contrast  Result Date: 06/13/2018 CLINICAL DATA:  Acute onset dysarthria earlier today. LEFT facial droop. Atrial fibrillation, on Xarelto. EXAM: CT ANGIOGRAPHY HEAD AND NECK TECHNIQUE: Multidetector CT imaging of the head and neck was performed using the standard protocol during bolus administration of intravenous contrast. Multiplanar CT image reconstructions and MIPs were obtained to evaluate the vascular anatomy. Carotid stenosis measurements (when applicable) are obtained utilizing NASCET criteria, using the distal internal carotid diameter as the denominator. CONTRAST:  75mL ISOVUE-370 IOPAMIDOL (ISOVUE-370) INJECTION 76% COMPARISON:  Code stroke CT earlier today was negative. FINDINGS: CTA NECK FINDINGS Aortic arch: Standard branching. Imaged portion shows no evidence of aneurysm or dissection. No significant stenosis of the major arch vessel origins. Aortic atherosclerosis. Right carotid system: Moderate soft plaque distal CCA, nonstenotic. Moderate to severe calcified and noncalcified plaque at the bifurcation extending into the proximal RIGHT ICA. Estimated 60% ICA stenosis based on luminal measurements of 1.6/4.1 proximal/distal. Cervical ICA patent. No dissection. Left carotid system: Minor soft plaque in the distal common carotid artery, nonstenotic. Severe calcified and noncalcified plaque at the bifurcation, extending into the proximal ICA. Estimated flow-limiting 80-90% stenosis based on luminal measurements of 0.9/4.5 proximal/distal. Cervical ICA patent. No dissection. Vertebral arteries: Nonstenotic ostial plaque at both vertebral origins. Nonstenotic  calcification of both proximal vertebral arteries which are patent, RIGHT slightly larger. Skeleton: Advanced cervical spondylosis, disc space narrowing most pronounced C4-5, C5-6, and C6-7. Poor dentition, numerous missing teeth, dental caries and periapical lucencies. Other neck: Unremarkable. Upper chest: No mass, pneumothorax, or effusion. Review of the MIP images confirms the above findings CTA HEAD FINDINGS Anterior circulation: Heavily calcified carotid siphons bilaterally, estimated 50-75% stenoses in the BILATERAL distal cavernous segments. ICA termini patent. No flow-limiting stenosis of the anterior or middle cerebral arteries. No MCA branch occlusion, although mild irregularity of the distal MCA branches is observed bilaterally. Posterior circulation: Dominant RIGHT vertebral supplies the basilar. Calcified stenosis of the LEFT vertebral estimated 50-75% in its V4 segment. Basilar artery is mildly irregular, and mildly hypoplastic due to fetal LEFT PCA. No flow-limiting PCA stenosis or cerebellar branch occlusion although the LEFT PCA appears slightly more irregular than the RIGHT. Venous sinuses: As permitted by contrast timing, patent. Anatomic variants: Fetal LEFT PCA. Delayed phase: Not performed. Review of the MIP images confirms the above findings IMPRESSION: 1. Severe calcified and noncalcified plaque at the LEFT carotid bifurcation, extending into the proximal ICA. Estimated 80-90% stenosis. 2. Non stenotic atheromatous change RIGHT carotid bifurcation, estimated 60% stenosis. 3. No intracranial flow-limiting stenosis or large vessel occlusion. Significant calcified plaque both distal cavernous carotid arteries, estimated 50-75% stenosis bilaterally. 4. Poor dentition, with numerous missing teeth, dental caries, and periapical lucencies. Electronically Signed   By: Elsie Stain M.D.   On: 06/13/2018 14:44   Ct Angio Neck W Or Wo Contrast  Result Date: 06/13/2018 CLINICAL DATA:  Acute onset  dysarthria earlier today. LEFT facial droop. Atrial fibrillation, on Xarelto. EXAM: CT ANGIOGRAPHY HEAD AND NECK TECHNIQUE: Multidetector CT imaging of the head and neck was performed using the standard protocol during bolus administration of intravenous contrast. Multiplanar CT image reconstructions and MIPs were obtained to evaluate the vascular anatomy. Carotid stenosis measurements (when applicable) are obtained utilizing NASCET criteria, using the distal internal carotid diameter as the denominator. CONTRAST:  75mL ISOVUE-370 IOPAMIDOL (ISOVUE-370) INJECTION 76% COMPARISON:  Code stroke CT earlier today was negative. FINDINGS: CTA NECK FINDINGS Aortic arch: Standard branching. Imaged portion  shows no evidence of aneurysm or dissection. No significant stenosis of the major arch vessel origins. Aortic atherosclerosis. Right carotid system: Moderate soft plaque distal CCA, nonstenotic. Moderate to severe calcified and noncalcified plaque at the bifurcation extending into the proximal RIGHT ICA. Estimated 60% ICA stenosis based on luminal measurements of 1.6/4.1 proximal/distal. Cervical ICA patent. No dissection. Left carotid system: Minor soft plaque in the distal common carotid artery, nonstenotic. Severe calcified and noncalcified plaque at the bifurcation, extending into the proximal ICA. Estimated flow-limiting 80-90% stenosis based on luminal measurements of 0.9/4.5 proximal/distal. Cervical ICA patent. No dissection. Vertebral arteries: Nonstenotic ostial plaque at both vertebral origins. Nonstenotic calcification of both proximal vertebral arteries which are patent, RIGHT slightly larger. Skeleton: Advanced cervical spondylosis, disc space narrowing most pronounced C4-5, C5-6, and C6-7. Poor dentition, numerous missing teeth, dental caries and periapical lucencies. Other neck: Unremarkable. Upper chest: No mass, pneumothorax, or effusion. Review of the MIP images confirms the above findings CTA HEAD  FINDINGS Anterior circulation: Heavily calcified carotid siphons bilaterally, estimated 50-75% stenoses in the BILATERAL distal cavernous segments. ICA termini patent. No flow-limiting stenosis of the anterior or middle cerebral arteries. No MCA branch occlusion, although mild irregularity of the distal MCA branches is observed bilaterally. Posterior circulation: Dominant RIGHT vertebral supplies the basilar. Calcified stenosis of the LEFT vertebral estimated 50-75% in its V4 segment. Basilar artery is mildly irregular, and mildly hypoplastic due to fetal LEFT PCA. No flow-limiting PCA stenosis or cerebellar branch occlusion although the LEFT PCA appears slightly more irregular than the RIGHT. Venous sinuses: As permitted by contrast timing, patent. Anatomic variants: Fetal LEFT PCA. Delayed phase: Not performed. Review of the MIP images confirms the above findings IMPRESSION: 1. Severe calcified and noncalcified plaque at the LEFT carotid bifurcation, extending into the proximal ICA. Estimated 80-90% stenosis. 2. Non stenotic atheromatous change RIGHT carotid bifurcation, estimated 60% stenosis. 3. No intracranial flow-limiting stenosis or large vessel occlusion. Significant calcified plaque both distal cavernous carotid arteries, estimated 50-75% stenosis bilaterally. 4. Poor dentition, with numerous missing teeth, dental caries, and periapical lucencies. Electronically Signed   By: Elsie Stain M.D.   On: 06/13/2018 14:44   Mr Brain Wo Contrast  Result Date: 06/14/2018 CLINICAL DATA:  TIA, initial exam EXAM: MRI HEAD WITHOUT CONTRAST TECHNIQUE: Multiplanar, multiecho pulse sequences of the brain and surrounding structures were obtained without intravenous contrast. COMPARISON:  CTA head and neck from yesterday FINDINGS: Brain: Subcentimeter acute to subacute (based on subjective degree of restricted diffusion) infarcts in the medial right temporal white matter, right subinsular white matter, high and  lateral right frontal cortex, and left frontal subcortical white matter. Mild presumed chronic small vessel ischemic type change in the cerebral white matter and pons. No hemorrhage, hydrocephalus, or masslike finding. Vascular: Asymmetric lost flow void in the left vertebral artery on axial slices at the V3/4 junction, but normal appearing on coronal T2 weighted imaging and patent on CTA yesterday. Skull and upper cervical spine: Major flow voids are preserved Sinuses/Orbits: Negative IMPRESSION: 1. Small patchy acute to subacute cortical and white matter infarcts in the bilateral cerebrum. 2. Background of mild chronic small vessel ischemia. Electronically Signed   By: Marnee Spring M.D.   On: 06/14/2018 12:35   Ct Head Code Stroke Wo Contrast  Addendum Date: 06/13/2018   ADDENDUM REPORT: 06/13/2018 13:21 ADDENDUM: Study discussed by telephone with Dr. Nita Sickle on 06/13/2018 at 1312 hours. Electronically Signed   By: Odessa Fleming M.D.   On: 06/13/2018 13:21  Result Date: 06/13/2018 CLINICAL DATA:  Code stroke. 62 year old male with slurred speech onset at 1217 hours. EXAM: CT HEAD WITHOUT CONTRAST TECHNIQUE: Contiguous axial images were obtained from the base of the skull through the vertex without intravenous contrast. COMPARISON:  None. FINDINGS: Brain: No midline shift, mass effect, or evidence of intracranial mass lesion. No acute intracranial hemorrhage identified. No ventriculomegaly. Gray-white matter differentiation is within normal limits for age. No cortically based acute infarct identified. Vascular: Calcified atherosclerosis at the skull base. No suspicious intracranial vascular hyperdensity. Skull: Negative. Sinuses/Orbits: Visualized paranasal sinuses and mastoids are well pneumatized. Other: Visualized orbit soft tissues are within normal limits. Negative scalp soft tissues. ASPECTS [facility] Stroke Program Early CT Score) - Ganglionic level infarction (caudate, lentiform nuclei,  internal capsule, insula, M1-M3 cortex): 7 - Supraganglionic infarction (M4-M6 cortex): 3 Total score (0-10 with 10 being normal): 10 IMPRESSION: 1. Normal for age noncontrast CT appearance of the brain. 2. ASPECTS 10. Electronically Signed: By: Odessa Fleming M.D. On: 06/13/2018 13:09    Assessment: 62 y.o. male with a history of afib on Xarelto, presenting with acute onset dysarthria.  Noted to have facial droop on examination as well.  Head CT reviewed and shows no acute changes.  Patient ineligible for tPA due to being on anticoagulation.  Will review vasculature to rule out need for intervention since posterior circulation event is in the differential.  Stroke Risk Factors - atrial fibrillation, hyperlipidemia and hypertension   - bilateral carotid stenosis seen on CTA.  - bilateral cerebral strokes in setting of carotid stenosis  - ASA 81 started along with anticoagulation   Pauletta Browns

## 2018-06-14 NOTE — Progress Notes (Signed)
[facility] - [place] [facility]   PATIENT NAME: [name]    MR#:  [id number]  DATE OF BIRTH:  [birth year]  SUBJECTIVE:  CHIEF COMPLAINT:   Chief Complaint  Patient presents with  . Aphasia   Confused Slurred speech Afebrile  REVIEW OF SYSTEMS:    Review of Systems  Unable to perform ROS: Mental status change    DRUG ALLERGIES:   Allergies  Allergen Reactions  . Aspirin Other (See Comments)    Told not to take because of blood thinner  . Ibuprofen Nausea And Vomiting  . Tylenol [Acetaminophen] Nausea And Vomiting    VITALS:  Blood pressure 102/62, pulse 74, temperature 97.6 F (36.4 C), temperature source Oral, resp. rate 18, height 5\' 11"  (1.803 m), weight 98.2 kg, SpO2 97 %.  PHYSICAL EXAMINATION:   Physical Exam  GENERAL:  62 y.o.-year-old patient lying in the bed with no acute distress.  EYES: Pupils equal, round, reactive to light and accommodation. No scleral icterus. Extraocular muscles intact.  HEENT: Head atraumatic, normocephalic. Oropharynx and nasopharynx clear.  NECK:  Supple, no jugular venous distention. No thyroid enlargement, no tenderness.  LUNGS: Normal breath sounds bilaterally, no wheezing, rales, rhonchi. No use of accessory muscles of respiration.  CARDIOVASCULAR: S1, S2 normal. No murmurs, rubs, or gallops.  ABDOMEN: Soft, nontender, nondistended. Bowel sounds present. No organomegaly or mass.  EXTREMITIES: No cyanosis, clubbing or edema b/l.    NEUROLOGIC: Moves all 4 extremities. Slurred speech PSYCHIATRIC: The patient is drowzy SKIN: No obvious rash, lesion, or ulcer.   LABORATORY PANEL:   CBC Recent Labs  Lab 06/13/18 1308  WBC 6.8  HGB 14.6  HCT 46.7  PLT 228   ------------------------------------------------------------------------------------------------------------------ Chemistries  Recent Labs  Lab 06/13/18 1308  NA 140  K 3.9  CL 102  CO2 25  GLUCOSE 135*  BUN 20  CREATININE 1.08   CALCIUM 9.2  AST 43*  ALT 43  ALKPHOS 199*  BILITOT 1.4*   ------------------------------------------------------------------------------------------------------------------  Cardiac Enzymes Recent Labs  Lab 06/13/18 2349  TROPONINI 0.14*   ------------------------------------------------------------------------------------------------------------------  RADIOLOGY:  Ct Angio Head W Or Wo Contrast  Result Date: 06/13/2018 CLINICAL DATA:  Acute onset dysarthria earlier today. LEFT facial droop. Atrial fibrillation, on Xarelto. EXAM: CT ANGIOGRAPHY HEAD AND NECK TECHNIQUE: Multidetector CT imaging of the head and neck was performed using the standard protocol during bolus administration of intravenous contrast. Multiplanar CT image reconstructions and MIPs were obtained to evaluate the vascular anatomy. Carotid stenosis measurements (when applicable) are obtained utilizing NASCET criteria, using the distal internal carotid diameter as the denominator. CONTRAST:  75mL ISOVUE-370 IOPAMIDOL (ISOVUE-370) INJECTION 76% COMPARISON:  Code stroke CT earlier today was negative. FINDINGS: CTA NECK FINDINGS Aortic arch: Standard branching. Imaged portion shows no evidence of aneurysm or dissection. No significant stenosis of the major arch vessel origins. Aortic atherosclerosis. Right carotid system: Moderate soft plaque distal CCA, nonstenotic. Moderate to severe calcified and noncalcified plaque at the bifurcation extending into the proximal RIGHT ICA. Estimated 60% ICA stenosis based on luminal measurements of 1.6/4.1 proximal/distal. Cervical ICA patent. No dissection. Left carotid system: Minor soft plaque in the distal common carotid artery, nonstenotic. Severe calcified and noncalcified plaque at the bifurcation, extending into the proximal ICA. Estimated flow-limiting 80-90% stenosis based on luminal measurements of 0.9/4.5 proximal/distal. Cervical ICA patent. No dissection. Vertebral arteries:  Nonstenotic ostial plaque at both vertebral origins. Nonstenotic calcification of both proximal vertebral arteries which are patent, RIGHT slightly larger. Skeleton: Advanced cervical  spondylosis, disc space narrowing most pronounced C4-5, C5-6, and C6-7. Poor dentition, numerous missing teeth, dental caries and periapical lucencies. Other neck: Unremarkable. Upper chest: No mass, pneumothorax, or effusion. Review of the MIP images confirms the above findings CTA HEAD FINDINGS Anterior circulation: Heavily calcified carotid siphons bilaterally, estimated 50-75% stenoses in the BILATERAL distal cavernous segments. ICA termini patent. No flow-limiting stenosis of the anterior or middle cerebral arteries. No MCA branch occlusion, although mild irregularity of the distal MCA branches is observed bilaterally. Posterior circulation: Dominant RIGHT vertebral supplies the basilar. Calcified stenosis of the LEFT vertebral estimated 50-75% in its V4 segment. Basilar artery is mildly irregular, and mildly hypoplastic due to fetal LEFT PCA. No flow-limiting PCA stenosis or cerebellar branch occlusion although the LEFT PCA appears slightly more irregular than the RIGHT. Venous sinuses: As permitted by contrast timing, patent. Anatomic variants: Fetal LEFT PCA. Delayed phase: Not performed. Review of the MIP images confirms the above findings IMPRESSION: 1. Severe calcified and noncalcified plaque at the LEFT carotid bifurcation, extending into the proximal ICA. Estimated 80-90% stenosis. 2. Non stenotic atheromatous change RIGHT carotid bifurcation, estimated 60% stenosis. 3. No intracranial flow-limiting stenosis or large vessel occlusion. Significant calcified plaque both distal cavernous carotid arteries, estimated 50-75% stenosis bilaterally. 4. Poor dentition, with numerous missing teeth, dental caries, and periapical lucencies. Electronically Signed   By: Elsie Stain M.D.   On: 06/13/2018 14:44   Ct Angio Neck W Or Wo  Contrast  Result Date: 06/13/2018 CLINICAL DATA:  Acute onset dysarthria earlier today. LEFT facial droop. Atrial fibrillation, on Xarelto. EXAM: CT ANGIOGRAPHY HEAD AND NECK TECHNIQUE: Multidetector CT imaging of the head and neck was performed using the standard protocol during bolus administration of intravenous contrast. Multiplanar CT image reconstructions and MIPs were obtained to evaluate the vascular anatomy. Carotid stenosis measurements (when applicable) are obtained utilizing NASCET criteria, using the distal internal carotid diameter as the denominator. CONTRAST:  75mL ISOVUE-370 IOPAMIDOL (ISOVUE-370) INJECTION 76% COMPARISON:  Code stroke CT earlier today was negative. FINDINGS: CTA NECK FINDINGS Aortic arch: Standard branching. Imaged portion shows no evidence of aneurysm or dissection. No significant stenosis of the major arch vessel origins. Aortic atherosclerosis. Right carotid system: Moderate soft plaque distal CCA, nonstenotic. Moderate to severe calcified and noncalcified plaque at the bifurcation extending into the proximal RIGHT ICA. Estimated 60% ICA stenosis based on luminal measurements of 1.6/4.1 proximal/distal. Cervical ICA patent. No dissection. Left carotid system: Minor soft plaque in the distal common carotid artery, nonstenotic. Severe calcified and noncalcified plaque at the bifurcation, extending into the proximal ICA. Estimated flow-limiting 80-90% stenosis based on luminal measurements of 0.9/4.5 proximal/distal. Cervical ICA patent. No dissection. Vertebral arteries: Nonstenotic ostial plaque at both vertebral origins. Nonstenotic calcification of both proximal vertebral arteries which are patent, RIGHT slightly larger. Skeleton: Advanced cervical spondylosis, disc space narrowing most pronounced C4-5, C5-6, and C6-7. Poor dentition, numerous missing teeth, dental caries and periapical lucencies. Other neck: Unremarkable. Upper chest: No mass, pneumothorax, or effusion.  Review of the MIP images confirms the above findings CTA HEAD FINDINGS Anterior circulation: Heavily calcified carotid siphons bilaterally, estimated 50-75% stenoses in the BILATERAL distal cavernous segments. ICA termini patent. No flow-limiting stenosis of the anterior or middle cerebral arteries. No MCA branch occlusion, although mild irregularity of the distal MCA branches is observed bilaterally. Posterior circulation: Dominant RIGHT vertebral supplies the basilar. Calcified stenosis of the LEFT vertebral estimated 50-75% in its V4 segment. Basilar artery is mildly irregular, and mildly hypoplastic due to  fetal LEFT PCA. No flow-limiting PCA stenosis or cerebellar branch occlusion although the LEFT PCA appears slightly more irregular than the RIGHT. Venous sinuses: As permitted by contrast timing, patent. Anatomic variants: Fetal LEFT PCA. Delayed phase: Not performed. Review of the MIP images confirms the above findings IMPRESSION: 1. Severe calcified and noncalcified plaque at the LEFT carotid bifurcation, extending into the proximal ICA. Estimated 80-90% stenosis. 2. Non stenotic atheromatous change RIGHT carotid bifurcation, estimated 60% stenosis. 3. No intracranial flow-limiting stenosis or large vessel occlusion. Significant calcified plaque both distal cavernous carotid arteries, estimated 50-75% stenosis bilaterally. 4. Poor dentition, with numerous missing teeth, dental caries, and periapical lucencies. Electronically Signed   By: Elsie Stain M.D.   On: 06/13/2018 14:44   Mr Brain Wo Contrast  Result Date: 06/14/2018 CLINICAL DATA:  TIA, initial exam EXAM: MRI HEAD WITHOUT CONTRAST TECHNIQUE: Multiplanar, multiecho pulse sequences of the brain and surrounding structures were obtained without intravenous contrast. COMPARISON:  CTA head and neck from yesterday FINDINGS: Brain: Subcentimeter acute to subacute (based on subjective degree of restricted diffusion) infarcts in the medial right  temporal white matter, right subinsular white matter, high and lateral right frontal cortex, and left frontal subcortical white matter. Mild presumed chronic small vessel ischemic type change in the cerebral white matter and pons. No hemorrhage, hydrocephalus, or masslike finding. Vascular: Asymmetric lost flow void in the left vertebral artery on axial slices at the V3/4 junction, but normal appearing on coronal T2 weighted imaging and patent on CTA yesterday. Skull and upper cervical spine: Major flow voids are preserved Sinuses/Orbits: Negative IMPRESSION: 1. Small patchy acute to subacute cortical and white matter infarcts in the bilateral cerebrum. 2. Background of mild chronic small vessel ischemia. Electronically Signed   By: Marnee Spring M.D.   On: 06/14/2018 12:35   Ct Head Code Stroke Wo Contrast  Addendum Date: 06/13/2018   ADDENDUM REPORT: 06/13/2018 13:21 ADDENDUM: Study discussed by telephone with Dr. Nita Sickle on 06/13/2018 at 1312 hours. Electronically Signed   By: Odessa Fleming M.D.   On: 06/13/2018 13:21   Result Date: 06/13/2018 CLINICAL DATA:  Code stroke. 62 year old male with slurred speech onset at 1217 hours. EXAM: CT HEAD WITHOUT CONTRAST TECHNIQUE: Contiguous axial images were obtained from the base of the skull through the vertex without intravenous contrast. COMPARISON:  None. FINDINGS: Brain: No midline shift, mass effect, or evidence of intracranial mass lesion. No acute intracranial hemorrhage identified. No ventriculomegaly. Gray-white matter differentiation is within normal limits for age. No cortically based acute infarct identified. Vascular: Calcified atherosclerosis at the skull base. No suspicious intracranial vascular hyperdensity. Skull: Negative. Sinuses/Orbits: Visualized paranasal sinuses and mastoids are well pneumatized. Other: Visualized orbit soft tissues are within normal limits. Negative scalp soft tissues. ASPECTS [facility] Stroke Program Early CT Score)  - Ganglionic level infarction (caudate, lentiform nuclei, internal capsule, insula, M1-M3 cortex): 7 - Supraganglionic infarction (M4-M6 cortex): 3 Total score (0-10 with 10 being normal): 10 IMPRESSION: 1. Normal for age noncontrast CT appearance of the brain. 2. ASPECTS 10. Electronically Signed: By: Odessa Fleming M.D. On: 06/13/2018 13:09     ASSESSMENT AND PLAN:   . Slurred speech Acute bilateral CVA ASA, Statin Discussed with Dr. Loretha Brasil. Wait for echocardiogram Will need TEE  . Carotid stenosis -Consulted Vascular surgery  . Elevated troponin -Chronically elevated at baseline. stable  Hypertension -Stable at baseline.  - PRN blood pressure medications ordered  . CAD (coronary artery disease) -Stable, home medications resumed  . COPD (chronic  obstructive pulmonary disease) (HCC) -Stable, home medications resumed  All the records are reviewed and case discussed with Care Management/Social Workerr. Management plans discussed with the patient, family and they are in agreement.  CODE STATUS: FULL CODE  DVT Prophylaxis: SCDs  TOTAL TIME TAKING CARE OF THIS PATIENT: 35 minutes.   POSSIBLE D/C IN 1-2 DAYS, DEPENDING ON CLINICAL CONDITION.  Molinda BailiffSrikar R Ashyah Quizon M.D on 06/14/2018 at 2:48 PM  Between 7am to 6pm - Pager - [phone]  After 6pm go to [email or web address] - password [email or web address]  [facility]  Office  [phone]  CC: Primary care physician; Administration, Veterans  Note: This dictation was prepared with Nurse, children'sDragon dictation along with smaller Lobbyistphrase technology. Any transcriptional errors that result from this process are unintentional.

## 2018-06-14 NOTE — Progress Notes (Signed)
PT Cancellation Note  Patient Details Name: James CluckSamuel Harrington MRN: [id number] DOB: Oct 04, 1955   Cancelled Treatment:    Reason Eval/Treat Not Completed: Other (comment).  Pt presenting with stroke like symptoms and is pending MRI.  Will wait on results from MRI before initiating PT.     Encarnacion ChuAshley Teagon Kron PT, DPT 06/14/2018, 8:19 AM

## 2018-06-14 NOTE — Progress Notes (Signed)
*  PRELIMINARY RESULTS* Echocardiogram 2D Echocardiogram has been performed.  Joanette GulaJoan M Vedanth Sirico 06/14/2018, 9:52 AM

## 2018-06-15 DIAGNOSIS — E119 Type 2 diabetes mellitus without complications: Secondary | ICD-10-CM

## 2018-06-15 DIAGNOSIS — I10 Essential (primary) hypertension: Secondary | ICD-10-CM

## 2018-06-15 DIAGNOSIS — I63233 Cerebral infarction due to unspecified occlusion or stenosis of bilateral carotid arteries: Secondary | ICD-10-CM

## 2018-06-15 DIAGNOSIS — E785 Hyperlipidemia, unspecified: Secondary | ICD-10-CM

## 2018-06-15 LAB — GLUCOSE, CAPILLARY
GLUCOSE-CAPILLARY: 216 mg/dL — AB (ref 70–99)
Glucose-Capillary: 138 mg/dL — ABNORMAL HIGH (ref 70–99)

## 2018-06-15 NOTE — Progress Notes (Signed)
Discharge instructions given and went over with patient and patients family at bedside. All questions answered. Patient discharged home with family via wheelchair by nursing staff. Bo McclintockBrewer,Bernadett Milian S, RN

## 2018-06-15 NOTE — Progress Notes (Deleted)
Occupational Therapy Treatment Patient Details Name: [name] MRN: [id number] DOB: Jan 25, 1956 Today's Date: 06/15/2018    History of present illness 62yo male pt w/ PMHx including CAD, HLD, HTN, COPD, DM2, Afib, and CHF who presented to ED on 06/13/18 with slurred speech. CT-, MRI + for small patchy acute to subacute cortical and white matter infarcts in the bilateral cerebrum.   OT comments    Follow Up Recommendations       Equipment Recommendations       Recommendations for Other Services      Precautions / Restrictions Precautions Precautions: Fall Restrictions Weight Bearing Restrictions: No       Mobility Bed Mobility               General bed mobility comments: Pt sitting in chair at start and end of session  Transfers Overall transfer level: Needs assistance Equipment used: Rolling walker (2 wheeled) Transfers: Sit to/from Stand Sit to Stand: Supervision         General transfer comment: Pt stands with much more ease compared to day prior.  Stand quickly and impulsively.  Supervision for safety.     Balance Overall balance assessment: No apparent balance deficits (not formally assessed) Sitting-balance support: No upper extremity supported;Feet supported Sitting balance-Leahy Scale: Good     Standing balance support: During functional activity;No upper extremity supported Standing balance-Leahy Scale: Fair Standing balance comment: Pt would likely lose his balance with perturbation.  Able to stand statically and ambulate short distances without UE support.                            ADL either performed or assessed with clinical judgement   ADL                           Toilet Transfer: Modified Independent;Cueing for safety;Supervision/safety Toilet Transfer Details (indicate cue type and reason): Pt required verbal cueing for 4WW positioning to increase safety with ambulation.  Toileting- Clothing Manipulation and  Hygiene: Supervision/safety Toileting - Clothing Manipulation Details (indicate cue type and reason): Pt required supervision while manipulating clothing.     Functional mobility during ADLs: Supervision/safety       Vision       Perception     Praxis      Cognition Arousal/Alertness: Awake/alert Behavior During Therapy: Impulsive Overall Cognitive Status: Within Functional Limits for tasks assessed                                 General Comments: A&Ox4, follows all commands        Exercises Exercises: (deferred as pt agitated after ambulating. )   Shoulder Instructions       General Comments HR monitor reporting a-fib rhythm.  HR 80s-low 100s during session.     Pertinent Vitals/ Pain       Pain Assessment: Faces Faces Pain Scale: No hurt Pain Location: no signs of pain Pain Intervention(s): Monitored during session  Home Living                                          Prior Functioning/Environment              Frequency  Progress Toward Goals  OT Goals(current goals can now be found in the care plan section)     Acute Rehab OT Goals Patient Stated Goal: to go home   Plan      Co-evaluation                 AM-PAC OT "6 Clicks" Daily Activity     Outcome Measure                    End of Session        Activity Tolerance     Patient Left     Nurse Communication          Time:  -     Charges:       Lavone Neri 06/15/2018, 12:29 PM

## 2018-06-15 NOTE — Progress Notes (Signed)
Occupational Therapy Treatment Patient Details Name: Jonell CluckSamuel Arko MRN: [id number] DOB: 04/09/1956 Today's Date: 06/15/2018    History of present illness 62yo male pt w/ PMHx including CAD, HLD, HTN, COPD, DM2, Afib, and CHF who presented to ED on 06/13/18 with slurred speech. CT-, MRI + for small patchy acute to subacute cortical and white matter infarcts in the bilateral cerebrum.   OT comments  Pt seen for OT treatment this date. Pt was educated on energy conservation to increase safety for ADL and IADL. Pt was given a energy conservation handout and was able to verbalize techniques to incorporate into activities. Pt continues to benefit from OT services.   Follow Up Recommendations  No OT follow up    Equipment Recommendations  None recommended by OT    Recommendations for Other Services      Precautions / Restrictions Precautions Precautions: Fall Restrictions Weight Bearing Restrictions: No       Mobility Bed Mobility               General bed mobility comments: deferred pt in recliner  Transfers Overall transfer level: Needs assistance Equipment used: Rolling walker (2 wheeled) Transfers: Sit to/from Stand Sit to Stand: Supervision            Balance Overall balance assessment: No apparent balance deficits (not formally assessed)       Standing balance support: During functional activity;Single extremity supported;Bilateral upper extremity supported;No upper extremity supported Standing balance-Leahy Scale: Good                         ADL either performed or assessed with clinical judgement   ADL                           Toilet Transfer: Cueing for safety;Supervision/safety;RW;Regular Toilet Toilet Transfer Details (indicate cue type and reason): Pt required verbal cueing for RW positioning to increase safety with ambulation.  Toileting- Clothing Manipulation and Hygiene: Supervision/safety Toileting - Clothing  Manipulation Details (indicate cue type and reason): Pt required supervision while manipulating clothing.     Functional mobility during ADLs: Supervision/safety;Cueing for safety;Rolling walker General ADL Comments: Pt required supervision for decreased safety with ambulating due to impulsivity.     Vision Patient Visual Report: No change from baseline     Perception     Praxis      Cognition Arousal/Alertness: Awake/alert Behavior During Therapy: Impulsive Overall Cognitive Status: Within Functional Limits for tasks assessed                                         Exercises     Pt was educated on energy conservation to increase safety for ADL and IADL. Pt was given a energy conservation handout and was able to verbalize techniques to incorporate into activities.    Shoulder Instructions       General Comments      Pertinent Vitals/ Pain       Pain Assessment: No/denies pain Faces Pain Scale: No hurt Pain Location: no signs of pain Pain Intervention(s): Monitored during session  Home Living                                          Prior  Functioning/Environment              Frequency  Min 1X/week        Progress Toward Goals  OT Goals(current goals can now be found in the care plan section)  Progress towards OT goals: Progressing toward goals  Acute Rehab OT Goals Patient Stated Goal: go home OT Goal Formulation: With patient Time For Goal Achievement: 06/28/18 Potential to Achieve Goals: Good  Plan Discharge plan remains appropriate;Frequency remains appropriate    Co-evaluation                 AM-PAC OT "6 Clicks" Daily Activity     Outcome Measure   Help from another person eating meals?: None Help from another person taking care of personal grooming?: None Help from another person toileting, which includes using toliet, bedpan, or urinal?: None Help from another person bathing (including  washing, rinsing, drying)?: A Little Help from another person to put on and taking off regular upper body clothing?: None Help from another person to put on and taking off regular lower body clothing?: A Little 6 Click Score: 22    End of Session Equipment Utilized During Treatment: Gait belt;Rolling walker  OT Visit Diagnosis: Other abnormalities of gait and mobility (R26.89);Hemiplegia and hemiparesis Hemiplegia - Right/Left: Left Hemiplegia - dominant/non-dominant: Non-Dominant Hemiplegia - caused by: Cerebral infarction   Activity Tolerance Patient tolerated treatment well   Patient Left in chair;with call bell/phone within reach;with chair alarm set   Nurse Communication          Time: 6045-4098 OT Time Calculation (min): 34 min  Charges: OT General Charges $OT Visit: 1 Visit OT Treatments $Self Care/Home Management : 23-37 mins     Sibyl Parr Toluwanimi Radebaugh,COTA/L 06/15/2018, 1:28 PM

## 2018-06-15 NOTE — Progress Notes (Signed)
Subjective: Patient alert and oriented x 4. He is up in chair today. He report that his speech is much improved today. Denies new strokes or stroke like symptoms.  Objective: Current vital signs: BP 122/86 (BP Location: Right Arm)   Pulse 82   Temp 98.2 F (36.8 C) (Oral)   Resp 18   Ht 5\' 11"  (1.803 m)   Wt 98.2 kg   SpO2 97%   BMI 30.19 kg/m  Vital signs in last 24 hours: Temp:  [97.6 F (36.4 C)-98.2 F (36.8 C)] 98.2 F (36.8 C) (12/17 0836) Pulse Rate:  [56-137] 82 (12/17 0836) Resp:  [14-20] 18 (12/17 0429) BP: (110-145)/(75-99) 122/86 (12/17 0836) SpO2:  [96 %-100 %] 97 % (12/17 0836)  Intake/Output from previous day: 12/16 0701 - 12/17 0700 In: 1200 [P.O.:1200] Out: 1075 [Urine:1075] Intake/Output this shift: Total I/O In: 360 [P.O.:360] Out: 300 [Urine:300] Nutritional status:  Diet Order            Diet heart healthy/carb modified Room service appropriate? Yes; Fluid consistency: Thin  Diet effective now             Neurologic Exam: Mental Status: Alert, oriented, thought content appropriate.  Speech mildly dysarthric without evidence of aphasia. Able to follow 3 step commands without difficulty. Cranial Nerves: II: Discs flat bilaterally; Visual fields grossly normal, pupils equal, round, reactive to light and accommodation III,IV, VI: ptosis not present, extra-ocular motions intact bilaterally V,VII: left facial droop, facial light touch sensation normal bilaterally VIII: hearing normal bilaterally IX,X: gag reflex present XI: bilateral shoulder shrug XII: midline tongue extension Motor: Right :  Upper extremity   5/5                                      Left:     Upper extremity   5/5             Lower extremity   5/5                                                  Lower extremity   5/5 Tone and bulk:normal tone throughout; no atrophy noted Sensory: Pinprick and light touch intact throughout, bilaterally Deep Tendon Reflexes: 2+ and symmetric  with absent AJ's bilaterally Plantars: Right: downgoing                                Left: downgoing Cerebellar: Normal finger-to-nose and normal heel-to-shin testing bilaterally Gait: not tested due to safety concerns  Lab Results: Basic Metabolic Panel: Recent Labs  Lab 06/13/18 1308  NA 140  K 3.9  CL 102  CO2 25  GLUCOSE 135*  BUN 20  CREATININE 1.08  CALCIUM 9.2    Liver Function Tests: Recent Labs  Lab 06/13/18 1308  AST 43*  ALT 43  ALKPHOS 199*  BILITOT 1.4*  PROT 7.5  ALBUMIN 3.8   No results for input(s): LIPASE, AMYLASE in the last 168 hours. No results for input(s): AMMONIA in the last 168 hours.  CBC: Recent Labs  Lab 06/13/18 1308  WBC 6.8  NEUTROABS 4.2  HGB 14.6  HCT 46.7  MCV 96.7  PLT 228    Cardiac Enzymes: Recent  Labs  Lab 06/13/18 1308 06/13/18 2106 06/13/18 2349  TROPONINI 0.15* 0.16* 0.14*    Lipid Panel: Recent Labs  Lab 06/14/18 0517  CHOL 139  TRIG 135  HDL 33*  CHOLHDL 4.2  VLDL 27  LDLCALC 79    CBG: Recent Labs  Lab 06/14/18 1241 06/14/18 1648 06/14/18 2108 06/15/18 0756 06/15/18 1207  GLUCAP 183* 116* 212* 138* 216*    Microbiology: Results for orders placed or performed during the hospital encounter of 01/19/18  Urine culture     Status: Abnormal   Collection Time: 01/19/18  5:28 PM  Result Value Ref Range Status   Specimen Description   Final    URINE, CLEAN CATCH Performed at [facility], [address], [place], [place] [address]    Special Requests   Final    NONE Performed at [facility], [address] [place], [place] [address]    Culture >=100,000 COLONIES/mL ESCHERICHIA COLI (A)  Final   Report Status 01/21/2018 FINAL  Final   Organism ID, Bacteria ESCHERICHIA COLI (A)  Final      Susceptibility   Escherichia coli - MIC*    AMPICILLIN <=2 SENSITIVE Sensitive     CEFAZOLIN <=4 SENSITIVE Sensitive     CEFTRIAXONE <=1 SENSITIVE Sensitive     CIPROFLOXACIN  <=0.25 SENSITIVE Sensitive     GENTAMICIN <=1 SENSITIVE Sensitive     IMIPENEM <=0.25 SENSITIVE Sensitive     NITROFURANTOIN 32 SENSITIVE Sensitive     TRIMETH/SULFA <=20 SENSITIVE Sensitive     AMPICILLIN/SULBACTAM <=2 SENSITIVE Sensitive     PIP/TAZO <=4 SENSITIVE Sensitive     Extended ESBL NEGATIVE Sensitive     * >=100,000 COLONIES/mL ESCHERICHIA COLI    Coagulation Studies: Recent Labs    06/13/18 1308  LABPROT 26.4*  INR 2.47    Imaging: Ct Angio Head W Or Wo Contrast  Result Date: 06/13/2018 CLINICAL DATA:  Acute onset dysarthria earlier today. LEFT facial droop. Atrial fibrillation, on Xarelto. EXAM: CT ANGIOGRAPHY HEAD AND NECK TECHNIQUE: Multidetector CT imaging of the head and neck was performed using the standard protocol during bolus administration of intravenous contrast. Multiplanar CT image reconstructions and MIPs were obtained to evaluate the vascular anatomy. Carotid stenosis measurements (when applicable) are obtained utilizing NASCET criteria, using the distal internal carotid diameter as the denominator. CONTRAST:  75mL ISOVUE-370 IOPAMIDOL (ISOVUE-370) INJECTION 76% COMPARISON:  Code stroke CT earlier today was negative. FINDINGS: CTA NECK FINDINGS Aortic arch: Standard branching. Imaged portion shows no evidence of aneurysm or dissection. No significant stenosis of the major arch vessel origins. Aortic atherosclerosis. Right carotid system: Moderate soft plaque distal CCA, nonstenotic. Moderate to severe calcified and noncalcified plaque at the bifurcation extending into the proximal RIGHT ICA. Estimated 60% ICA stenosis based on luminal measurements of 1.6/4.1 proximal/distal. Cervical ICA patent. No dissection. Left carotid system: Minor soft plaque in the distal common carotid artery, nonstenotic. Severe calcified and noncalcified plaque at the bifurcation, extending into the proximal ICA. Estimated flow-limiting 80-90% stenosis based on luminal measurements of  0.9/4.5 proximal/distal. Cervical ICA patent. No dissection. Vertebral arteries: Nonstenotic ostial plaque at both vertebral origins. Nonstenotic calcification of both proximal vertebral arteries which are patent, RIGHT slightly larger. Skeleton: Advanced cervical spondylosis, disc space narrowing most pronounced C4-5, C5-6, and C6-7. Poor dentition, numerous missing teeth, dental caries and periapical lucencies. Other neck: Unremarkable. Upper chest: No mass, pneumothorax, or effusion. Review of the MIP images confirms the above findings CTA HEAD FINDINGS Anterior circulation: Heavily  calcified carotid siphons bilaterally, estimated 50-75% stenoses in the BILATERAL distal cavernous segments. ICA termini patent. No flow-limiting stenosis of the anterior or middle cerebral arteries. No MCA branch occlusion, although mild irregularity of the distal MCA branches is observed bilaterally. Posterior circulation: Dominant RIGHT vertebral supplies the basilar. Calcified stenosis of the LEFT vertebral estimated 50-75% in its V4 segment. Basilar artery is mildly irregular, and mildly hypoplastic due to fetal LEFT PCA. No flow-limiting PCA stenosis or cerebellar branch occlusion although the LEFT PCA appears slightly more irregular than the RIGHT. Venous sinuses: As permitted by contrast timing, patent. Anatomic variants: Fetal LEFT PCA. Delayed phase: Not performed. Review of the MIP images confirms the above findings IMPRESSION: 1. Severe calcified and noncalcified plaque at the LEFT carotid bifurcation, extending into the proximal ICA. Estimated 80-90% stenosis. 2. Non stenotic atheromatous change RIGHT carotid bifurcation, estimated 60% stenosis. 3. No intracranial flow-limiting stenosis or large vessel occlusion. Significant calcified plaque both distal cavernous carotid arteries, estimated 50-75% stenosis bilaterally. 4. Poor dentition, with numerous missing teeth, dental caries, and periapical lucencies. Electronically  Signed   By: Elsie StainJohn T Curnes M.D.   On: 06/13/2018 14:44   Ct Angio Neck W Or Wo Contrast  Result Date: 06/13/2018 CLINICAL DATA:  Acute onset dysarthria earlier today. LEFT facial droop. Atrial fibrillation, on Xarelto. EXAM: CT ANGIOGRAPHY HEAD AND NECK TECHNIQUE: Multidetector CT imaging of the head and neck was performed using the standard protocol during bolus administration of intravenous contrast. Multiplanar CT image reconstructions and MIPs were obtained to evaluate the vascular anatomy. Carotid stenosis measurements (when applicable) are obtained utilizing NASCET criteria, using the distal internal carotid diameter as the denominator. CONTRAST:  75mL ISOVUE-370 IOPAMIDOL (ISOVUE-370) INJECTION 76% COMPARISON:  Code stroke CT earlier today was negative. FINDINGS: CTA NECK FINDINGS Aortic arch: Standard branching. Imaged portion shows no evidence of aneurysm or dissection. No significant stenosis of the major arch vessel origins. Aortic atherosclerosis. Right carotid system: Moderate soft plaque distal CCA, nonstenotic. Moderate to severe calcified and noncalcified plaque at the bifurcation extending into the proximal RIGHT ICA. Estimated 60% ICA stenosis based on luminal measurements of 1.6/4.1 proximal/distal. Cervical ICA patent. No dissection. Left carotid system: Minor soft plaque in the distal common carotid artery, nonstenotic. Severe calcified and noncalcified plaque at the bifurcation, extending into the proximal ICA. Estimated flow-limiting 80-90% stenosis based on luminal measurements of 0.9/4.5 proximal/distal. Cervical ICA patent. No dissection. Vertebral arteries: Nonstenotic ostial plaque at both vertebral origins. Nonstenotic calcification of both proximal vertebral arteries which are patent, RIGHT slightly larger. Skeleton: Advanced cervical spondylosis, disc space narrowing most pronounced C4-5, C5-6, and C6-7. Poor dentition, numerous missing teeth, dental caries and periapical  lucencies. Other neck: Unremarkable. Upper chest: No mass, pneumothorax, or effusion. Review of the MIP images confirms the above findings CTA HEAD FINDINGS Anterior circulation: Heavily calcified carotid siphons bilaterally, estimated 50-75% stenoses in the BILATERAL distal cavernous segments. ICA termini patent. No flow-limiting stenosis of the anterior or middle cerebral arteries. No MCA branch occlusion, although mild irregularity of the distal MCA branches is observed bilaterally. Posterior circulation: Dominant RIGHT vertebral supplies the basilar. Calcified stenosis of the LEFT vertebral estimated 50-75% in its V4 segment. Basilar artery is mildly irregular, and mildly hypoplastic due to fetal LEFT PCA. No flow-limiting PCA stenosis or cerebellar branch occlusion although the LEFT PCA appears slightly more irregular than the RIGHT. Venous sinuses: As permitted by contrast timing, patent. Anatomic variants: Fetal LEFT PCA. Delayed phase: Not performed. Review of the MIP images confirms  the above findings IMPRESSION: 1. Severe calcified and noncalcified plaque at the LEFT carotid bifurcation, extending into the proximal ICA. Estimated 80-90% stenosis. 2. Non stenotic atheromatous change RIGHT carotid bifurcation, estimated 60% stenosis. 3. No intracranial flow-limiting stenosis or large vessel occlusion. Significant calcified plaque both distal cavernous carotid arteries, estimated 50-75% stenosis bilaterally. 4. Poor dentition, with numerous missing teeth, dental caries, and periapical lucencies. Electronically Signed   By: Elsie Stain M.D.   On: 06/13/2018 14:44   Mr Brain Wo Contrast  Result Date: 06/14/2018 CLINICAL DATA:  TIA, initial exam EXAM: MRI HEAD WITHOUT CONTRAST TECHNIQUE: Multiplanar, multiecho pulse sequences of the brain and surrounding structures were obtained without intravenous contrast. COMPARISON:  CTA head and neck from yesterday FINDINGS: Brain: Subcentimeter acute to subacute  (based on subjective degree of restricted diffusion) infarcts in the medial right temporal white matter, right subinsular white matter, high and lateral right frontal cortex, and left frontal subcortical white matter. Mild presumed chronic small vessel ischemic type change in the cerebral white matter and pons. No hemorrhage, hydrocephalus, or masslike finding. Vascular: Asymmetric lost flow void in the left vertebral artery on axial slices at the V3/4 junction, but normal appearing on coronal T2 weighted imaging and patent on CTA yesterday. Skull and upper cervical spine: Major flow voids are preserved Sinuses/Orbits: Negative IMPRESSION: 1. Small patchy acute to subacute cortical and white matter infarcts in the bilateral cerebrum. 2. Background of mild chronic small vessel ischemia. Electronically Signed   By: Marnee Spring M.D.   On: 06/14/2018 12:35   Ct Head Code Stroke Wo Contrast  Addendum Date: 06/13/2018   ADDENDUM REPORT: 06/13/2018 13:21 ADDENDUM: Study discussed by telephone with Dr. Nita Sickle on 06/13/2018 at 1312 hours. Electronically Signed   By: Odessa Fleming M.D.   On: 06/13/2018 13:21   Result Date: 06/13/2018 CLINICAL DATA:  Code stroke. 62 year old male with slurred speech onset at 1217 hours. EXAM: CT HEAD WITHOUT CONTRAST TECHNIQUE: Contiguous axial images were obtained from the base of the skull through the vertex without intravenous contrast. COMPARISON:  None. FINDINGS: Brain: No midline shift, mass effect, or evidence of intracranial mass lesion. No acute intracranial hemorrhage identified. No ventriculomegaly. Gray-white matter differentiation is within normal limits for age. No cortically based acute infarct identified. Vascular: Calcified atherosclerosis at the skull base. No suspicious intracranial vascular hyperdensity. Skull: Negative. Sinuses/Orbits: Visualized paranasal sinuses and mastoids are well pneumatized. Other: Visualized orbit soft tissues are within normal  limits. Negative scalp soft tissues. ASPECTS [facility] Stroke Program Early CT Score) - Ganglionic level infarction (caudate, lentiform nuclei, internal capsule, insula, M1-M3 cortex): 7 - Supraganglionic infarction (M4-M6 cortex): 3 Total score (0-10 with 10 being normal): 10 IMPRESSION: 1. Normal for age noncontrast CT appearance of the brain. 2. ASPECTS 10. Electronically Signed: By: Odessa Fleming M.D. On: 06/13/2018 13:09    Medications:  I have reviewed the patient's current medications. Prior to Admission:  Medications Prior to Admission  Medication Sig Dispense Refill Last Dose  . albuterol-ipratropium (COMBIVENT) 18-103 MCG/ACT inhaler Inhale 2 puffs into the lungs every 4 (four) hours as needed for wheezing.   PRN at PRN  . atorvastatin (LIPITOR) 40 MG tablet Take 40 mg by mouth daily.    06/12/2018 at Unknown  . budesonide-formoterol (SYMBICORT) 160-4.5 MCG/ACT inhaler Inhale 2 puffs into the lungs 2 (two) times daily.   Unknown at Unknown  . carvedilol (COREG) 25 MG tablet Take 1 tablet (25 mg total) by mouth 2 (two) times daily with  a meal. 60 tablet 0 Unknown at Unknown  . docusate sodium (COLACE) 100 MG capsule Take 100 mg by mouth 2 (two) times daily.   Unknown at Unknown  . metFORMIN (GLUCOPHAGE) 1000 MG tablet Take 1,000 mg by mouth 2 (two) times daily with a meal.    Unknown at Unknown  . methocarbamol (ROBAXIN) 500 MG tablet Take 1 tablet (500 mg total) by mouth 4 (four) times daily. 20 tablet 0 Unknown at Unknown  . nitroGLYCERIN (NITROSTAT) 0.4 MG SL tablet Place 0.4 mg under the tongue every 5 (five) minutes as needed for chest pain.   PRN at PRN  . potassium chloride SA (K-DUR,KLOR-CON) 20 MEQ tablet Take 1 tablet (20 mEq total) by mouth daily. 10 tablet 0 Unknown at Unknown  . rivaroxaban (XARELTO) 20 MG TABS tablet Take 1 tablet (20 mg total) by mouth daily with supper. 30 tablet 0 06/13/2018 at 0000  . spironolactone (ALDACTONE) 25 MG tablet Take 1 tablet (25 mg total) by mouth  daily. 30 tablet 0 Unknown at Unknown  . torsemide (DEMADEX) 20 MG tablet Take 2 tablets (40 mg total) by mouth 2 (two) times daily. 60 tablet 0 Unknown at Unknown  . traZODone (DESYREL) 50 MG tablet Take 50 mg by mouth at bedtime.   Unknown at Unknown  . cephALEXin (KEFLEX) 500 MG capsule Take 1 capsule (500 mg total) by mouth every 12 (twelve) hours. (Patient not taking: Reported on 06/13/2018) 8 capsule 0 Completed Course at Unknown time  . losartan (COZAAR) 25 MG tablet Take 2 tablets (50 mg total) by mouth daily. (Patient not taking: Reported on 06/13/2018) 60 tablet 0 Not Taking at Unknown time  . oxyCODONE (OXY IR/ROXICODONE) 5 MG immediate release tablet Take 1 tablet (5 mg total) by mouth every 6 (six) hours as needed for severe pain. (Patient not taking: Reported on 06/13/2018) 12 tablet 0 Not Taking at Unknown time   Scheduled: . atorvastatin  40 mg Oral Daily  . carvedilol  25 mg Oral BID WC  . insulin aspart  0-15 Units Subcutaneous TID WC  . insulin aspart  0-5 Units Subcutaneous QHS  . mometasone-formoterol  2 puff Inhalation BID  . pneumococcal 23 valent vaccine  0.5 mL Intramuscular Tomorrow-1000  . potassium chloride SA  20 mEq Oral Daily  . rivaroxaban  20 mg Oral Q supper  . spironolactone  25 mg Oral Daily  . torsemide  100 mg Oral BID  . traZODone  50 mg Oral QHS    Assessment: 62 y.o. male with a history of  DM, hyperlipidemia, hypertension, CAD, COPD, CHF, tobacco abuse and afib on Xarelto presenting with acute onset dysarthria. Noted to have facial droop on examination as well. Initial CT head was negative for acute intracranial abnormality. Follow up MRI brain reviewed and showed Small patchy acute to subacute cortical and white matter infarcts in the bilateral cerebrum. Etiology likely small vessel disease. CTA head and neck showed flow-limiting 80-90% stenosis into the proximal left  ICA and 60% stenosis of right ICA. Pending intervention per vascular in 2 weeks.  Hemoglobin A1c 7.8, LDL 79.  Plan: 1. Continue medical management with dual therapy Aspirin 81 mg/day and Xarelto  with intensive management of vascular risk factor to keep systolic BP (SBP) <140 mm Hg (161 mm Hg if diabetic 2. Aggressive lipid management with goal low density lipoprotein (LDL) <70 mg/dl 3. Glycemic control with goal Hemoglobin A1c <7.0 4. Echocardiogram pending 5. Smoking cessation counseling  This patient was  staffed with Dr. Loretha Brasil, Doyle Askew who personally evaluated patient, reviewed documentation and agreed with assessment and plan of care as above.  Webb Silversmith, DNP, FNP-BC Board certified Nurse Practitioner Neurology Department   LOS: 1 day   06/15/2018  12:47 PM

## 2018-06-15 NOTE — Care Management (Signed)
Physical therapy is recommending home  Health physical therapy. Zip code Home Health compare information given to Mr. [name] and place on the chart. Chose [facility]. [name] , representative for [facility] updated. States she will start the authorization [facility]. Discharge to home today per Dr. Elpidio AnisSudini. Family will transport. Gwenette GreetBrenda S Atthew Coutant RN MSN CCM Care Management [phone]

## 2018-06-15 NOTE — Progress Notes (Signed)
Physical Therapy Treatment Patient Details Name: [name] MRN: [id number] DOB: Jun 01, 1956 Today's Date: 06/15/2018    History of Present Illness 62yo male pt w/ PMHx including CAD, HLD, HTN, COPD, DM2, Afib, and CHF who presented to ED on 06/13/18 with slurred speech. CT-, MRI + for small patchy acute to subacute cortical and white matter infarcts in the bilateral cerebrum.    PT Comments    Mr. Paparella was pleasant initially but becomes agitated when verbal cues provided for safe management of RW when ambulating, ultimately limiting session.  He ambulated 160 ft using RW with cues for improved foot clearance Bil to dec fall risk.  Deferred therapeutic exercises at end of session due to agitation.  Follow up recommendations remain appropriate.     Follow Up Recommendations  Home health PT     Equipment Recommendations  None recommended by PT    Recommendations for Other Services       Precautions / Restrictions Precautions Precautions: Fall Restrictions Weight Bearing Restrictions: No    Mobility  Bed Mobility               General bed mobility comments: Pt sitting in chair at start and end of session  Transfers Overall transfer level: Needs assistance Equipment used: Rolling walker (2 wheeled) Transfers: Sit to/from Stand Sit to Stand: Supervision         General transfer comment: Pt stands with much more ease compared to day prior.  Stand quickly and impulsively.  Supervision for safety.   Ambulation/Gait Ambulation/Gait assistance: Supervision Gait Distance (Feet): 160 Feet Assistive device: Rolling walker (2 wheeled) Gait Pattern/deviations: Decreased step length - right;Decreased step length - left;Decreased dorsiflexion - right;Decreased dorsiflexion - left Gait velocity: decreased   General Gait Details: Pt demonstrates upright posture this session.  Poor foot clearance Bil, shuffling feet.  This improves with cues for improved technique. Pt picks  up RW when turning and becomes agitated when verbal cues provided for safe RW management.    Stairs             Wheelchair Mobility    Modified Rankin (Stroke Patients Only)       Balance Overall balance assessment: Needs assistance;History of Falls Sitting-balance support: No upper extremity supported;Feet supported Sitting balance-Leahy Scale: Good     Standing balance support: During functional activity;No upper extremity supported Standing balance-Leahy Scale: Fair Standing balance comment: Pt would likely lose his balance with perturbation.  Able to stand statically and ambulate short distances without UE support.                             Cognition Arousal/Alertness: Awake/alert Behavior During Therapy: WFL for tasks assessed/performed Overall Cognitive Status: Within Functional Limits for tasks assessed                                 General Comments: A&Ox4, follows all commands      Exercises      General Comments General comments (skin integrity, edema, etc.): HR monitor reporting a-fib rhythm.  HR 80s-low 100s during session.       Pertinent Vitals/Pain Pain Assessment: Faces Faces Pain Scale: No hurt Pain Location: no signs of pain Pain Intervention(s): Monitored during session    Home Living  Prior Function            PT Goals (current goals can now be found in the care plan section) Acute Rehab PT Goals Patient Stated Goal: to go home  PT Goal Formulation: With patient Time For Goal Achievement: 06/28/18 Potential to Achieve Goals: Good Progress towards PT goals: Progressing toward goals    Frequency    7X/week      PT Plan Current plan remains appropriate    Co-evaluation              AM-PAC PT "6 Clicks" Mobility   Outcome Measure  Help needed turning from your back to your side while in a flat bed without using bedrails?: None Help needed moving from lying on  your back to sitting on the side of a flat bed without using bedrails?: A Little Help needed moving to and from a bed to a chair (including a wheelchair)?: A Little Help needed standing up from a chair using your arms (e.g., wheelchair or bedside chair)?: A Little Help needed to walk in hospital room?: A Little Help needed climbing 3-5 steps with a railing? : A Little 6 Click Score: 19    End of Session Equipment Utilized During Treatment: Gait belt Activity Tolerance: Treatment limited secondary to agitation Patient left: in chair;with call bell/phone within reach;with chair alarm set Nurse Communication: Mobility status PT Visit Diagnosis: Unsteadiness on feet (R26.81);Muscle weakness (generalized) (M62.81)     Time: [id number] PT Time Calculation (min) (ACUTE ONLY): 13 min  Charges:  $Gait Training: 8-22 mins                     Session was performed by student PT, Lisbeth RenshawShane Courtney, and directed, overseen, and documented by this PT.  Encarnacion ChuAshley Abashian PT, DPT 06/15/2018, 10:14 AM

## 2018-06-24 ENCOUNTER — Ambulatory Visit (INDEPENDENT_AMBULATORY_CARE_PROVIDER_SITE_OTHER): Payer: Medicare Other | Admitting: Vascular Surgery

## 2018-06-27 NOTE — Discharge Summary (Signed)
[facility]   PATIENT NAME: [name]    MR#:  [id number]  DATE OF BIRTH:  1955/09/19  DATE OF ADMISSION:  06/13/2018 ADMITTING PHYSICIAN: Gery Prayebby Crosley, MD  DATE OF DISCHARGE: 06/15/2018  2:01 PM  PRIMARY CARE PHYSICIAN: [facility], [facility]   ADMISSION DIAGNOSIS:  Demand ischemia of myocardium (HCC) [I24.8] Cerebrovascular accident (CVA), unspecified mechanism (HCC) [I63.9]  DISCHARGE DIAGNOSIS:  Principal Problem:   Slurred speech Active Problems:   COPD (chronic obstructive pulmonary disease) (HCC)   CAD (coronary artery disease)   HLD (hyperlipidemia)   Tobacco abuse   Accelerated hypertension   Carotid stenosis   Elevated troponin   Atrial fibrillation, chronic   Type 2 diabetes mellitus without complication (HCC)   TIA (transient ischemic attack)   Acute CVA (cerebrovascular accident) (HCC)   SECONDARY DIAGNOSIS:   Past Medical History:  Diagnosis Date  . Atrial fibrillation (HCC)   . CAD in native artery    a. stress echo 12/2007 abnl, EF > 55%, b. LHC 01/28/08: mLAD 30, D1 40, dLCx 70, pRCA 30, mRCA 70, mRCA lesion 2 80, PDA 90, s/p PCI/BMS to prox and distal RCA, s/p PCI/BMS to PDA; c. patient reports PCI[facility] x 2 in early [facility] (no records on file) d. 06/2016: cath showing patent stents along RCA and LCx with moderate 40% stenosis along the LAD.   [name]. Chronic combined systolic (congestive) and diastolic (congestive) heart failure (HCC)    a. echo 2009: > 55% b. 06/2016: EF 25-30% by echo in 06/2016, cath showing patent stents with moderate disease along LAD  . COPD (chronic obstructive pulmonary disease) (HCC)   . Diabetes mellitus without complication (HCC)   . Gout   . Hyperlipidemia   . Hypertension   . Hypertensive heart disease   . Obesity   . Tobacco abuse      ADMITTING HISTORY  HPI: this is a 62 y/o male who presents with c/o slurred speech and facial droop noted mainly around the  mouth. Sudden onset around 12PM. He denies any HA or confusions. He denies any localized weakness. He denies being ill prior, no nausea, vomiting ir diarrhea. It lasted approx 1 hour, then self extinguished. He has CAD and a h/o Atrial fibrillation.   HOSPITAL COURSE:   *Acute bilateral cerebral CVA Patient on aspirin, Eliquis, statin.  Seen by neurology.  Echocardiogram with bubble study showed nothing acute.  Carotid Dopplers showed significant stenosis.  Vascular surgery consulted.  Advised outpatient follow-up in 1 to 2 weeks.  Patient given information for vascular surgery and appointment.  Symptoms have resolved.  Discussed with family.  Patient will be discharged home with home health services.  Blood pressure stable.  CAD and COPD stable.  Follow-up with PCP, neurology and vascular surgery within a week.  CONSULTS OBTAINED:  Treatment Team:  Kym Groomriadhosp, Neuro1, MD  DRUG ALLERGIES:   Allergies  Allergen Reactions  . Aspirin Other (See Comments)    Told not to take because of blood thinner  . Ibuprofen Nausea And Vomiting  . Tylenol [Acetaminophen] Nausea And Vomiting    DISCHARGE MEDICATIONS:   Allergies as of 06/15/2018      Reactions   Aspirin Other (See Comments)   Told not to take because of blood thinner   Ibuprofen Nausea And Vomiting   Tylenol [acetaminophen] Nausea And Vomiting      Medication List    STOP taking these medications   cephALEXin 500 MG capsule  Commonly known as:  KEFLEX   losartan 25 MG tablet Commonly known as:  COZAAR   oxyCODONE 5 MG immediate release tablet Commonly known as:  Oxy IR/ROXICODONE     TAKE these medications   atorvastatin 40 MG tablet Commonly known as:  LIPITOR Take 40 mg by mouth daily.   budesonide-formoterol 160-4.5 MCG/ACT inhaler Commonly known as:  SYMBICORT Inhale 2 puffs into the lungs 2 (two) times daily.   carvedilol 25 MG tablet Commonly known as:  COREG Take 1 tablet (25 mg total) by mouth 2 (two)  times daily with a meal.   COMBIVENT 18-103 MCG/ACT inhaler Generic drug:  albuterol-ipratropium Inhale 2 puffs into the lungs every 4 (four) hours as needed for wheezing.   docusate sodium 100 MG capsule Commonly known as:  COLACE Take 100 mg by mouth 2 (two) times daily.   metFORMIN 1000 MG tablet Commonly known as:  GLUCOPHAGE Take 1,000 mg by mouth 2 (two) times daily with a meal.   methocarbamol 500 MG tablet Commonly known as:  ROBAXIN Take 1 tablet (500 mg total) by mouth 4 (four) times daily.   nitroGLYCERIN 0.4 MG SL tablet Commonly known as:  NITROSTAT Place 0.4 mg under the tongue every 5 (five) minutes as needed for chest pain.   potassium chloride SA 20 MEQ tablet Commonly known as:  K-DUR,KLOR-CON Take 1 tablet (20 mEq total) by mouth daily.   rivaroxaban 20 MG Tabs tablet Commonly known as:  XARELTO Take 1 tablet (20 mg total) by mouth daily with supper.   spironolactone 25 MG tablet Commonly known as:  ALDACTONE Take 1 tablet (25 mg total) by mouth daily.   torsemide 20 MG tablet Commonly known as:  DEMADEX Take 2 tablets (40 mg total) by mouth 2 (two) times daily.   traZODone 50 MG tablet Commonly known as:  DESYREL Take 50 mg by mouth at bedtime.       Today   VITAL SIGNS:  Blood pressure 122/86, pulse 82, temperature 98.2 F (36.8 C), temperature source Oral, resp. rate 18, height 5\' 11"  (1.803 m), weight 98.2 kg, SpO2 97 %.  I/O:  No intake or output data in the 24 hours ending 06/27/18 1313  PHYSICAL EXAMINATION:  Physical Exam  GENERAL:  62 y.o.-year-old patient lying in the bed with no acute distress.  LUNGS: Normal breath sounds bilaterally, no wheezing, rales,rhonchi or crepitation. No use of accessory muscles of respiration.  CARDIOVASCULAR: S1, S2 normal. No murmurs, rubs, or gallops.  ABDOMEN: Soft, non-tender, non-distended. Bowel sounds present. No organomegaly or mass.  NEUROLOGIC: Moves all 4 extremities. PSYCHIATRIC: The  patient is alert and oriented x 3.  SKIN: No obvious rash, lesion, or ulcer.   DATA REVIEW:   CBC No results for input(s): WBC, HGB, HCT, PLT in the last 168 hours.  Chemistries  No results for input(s): NA, K, CL, CO2, GLUCOSE, BUN, CREATININE, CALCIUM, MG, AST, ALT, ALKPHOS, BILITOT in the last 168 hours.  Invalid input(s): GFRCGP  Cardiac Enzymes No results for input(s): TROPONINI in the last 168 hours.  Microbiology Results  Results for orders placed or performed during the hospital encounter of 01/19/18  Urine culture     Status: Abnormal   Collection Time: 01/19/18  5:28 PM  Result Value Ref Range Status   Specimen Description   Final    URINE, CLEAN CATCH Performed at [facility], [address], [place], [place] [address]    Special Requests   Final  NONE Performed at [facility], [address] [place], [place] [address]    Culture >=100,000 COLONIES/mL ESCHERICHIA COLI (A)  Final   Report Status 01/21/2018 FINAL  Final   Organism ID, Bacteria ESCHERICHIA COLI (A)  Final      Susceptibility   Escherichia coli - MIC*    AMPICILLIN <=2 SENSITIVE Sensitive     CEFAZOLIN <=4 SENSITIVE Sensitive     CEFTRIAXONE <=1 SENSITIVE Sensitive     CIPROFLOXACIN <=0.25 SENSITIVE Sensitive     GENTAMICIN <=1 SENSITIVE Sensitive     IMIPENEM <=0.25 SENSITIVE Sensitive     NITROFURANTOIN 32 SENSITIVE Sensitive     TRIMETH/SULFA <=20 SENSITIVE Sensitive     AMPICILLIN/SULBACTAM <=2 SENSITIVE Sensitive     PIP/TAZO <=4 SENSITIVE Sensitive     Extended ESBL NEGATIVE Sensitive     * >=100,000 COLONIES/mL ESCHERICHIA COLI    RADIOLOGY:  No results found.  Follow up with PCP in 1 week.  Management plans discussed with the patient, family and they are in agreement.  CODE STATUS:  Code Status History    Date Active Date Inactive Code Status Order ID Comments User Context   06/13/2018 2049 06/15/2018 1706 Full Code [id number]  Gery Prayrosley, Debby, MD  Inpatient   01/19/2018 2358 01/21/2018 1343 Full Code [id number]  Cammy CopaMaier, Angela, MD ED   03/26/2017 0636 03/27/2017 1729 Full Code [id number]  Arnaldo Nataliamond, Michael S, MD Inpatient   02/18/2017 2112 02/19/2017 1707 Full Code [id number]  Houston SirenSainani, Vivek J, MD Inpatient   01/05/2017 2246 01/07/2017 1614 Full Code [id number]  Altamese DillingVachhani, Vaibhavkumar, MD Inpatient   07/24/2016 0235 07/31/2016 1401 Full Code [id number]  Oralia ManisWillis, David, MD Inpatient    Advance Directive Documentation     Most Recent Value  Type of Advance Directive  Healthcare Power of Attorney, Living will  Pre-existing out of facility DNR order (yellow form or pink MOST form)  -  "MOST" Form in Place?  -      TOTAL TIME TAKING CARE OF THIS PATIENT ON DAY OF DISCHARGE: more than 30 minutes.   Molinda BailiffSrikar R Charrise Lardner M.D on 06/27/2018 at 1:13 PM  Between 7am to 6pm - Pager - [phone]  After 6pm go to [email or web address] - password [email or web address]  [facility] [place] [facility]  Office  [phone]  CC: Primary care physician; Administration, Veterans  Note: This dictation was prepared with Nurse, children'sDragon dictation along with smaller Lobbyistphrase technology. Any transcriptional errors that result from this process are unintentional.

## 2018-06-28 ENCOUNTER — Telehealth (INDEPENDENT_AMBULATORY_CARE_PROVIDER_SITE_OTHER): Payer: Self-pay

## 2018-06-29 NOTE — Telephone Encounter (Signed)
Patient was scheduled with [facility] but that had to be canceled due to the patient has VA insurance so [facility] and when that is done the patient will contact me.

## 2018-07-08 ENCOUNTER — Other Ambulatory Visit: Payer: Non-veteran care

## 2018-07-09 ENCOUNTER — Ambulatory Visit: Admit: 2018-07-09 | Payer: Non-veteran care | Admitting: Vascular Surgery

## 2018-07-09 SURGERY — CAROTID PTA/STENT INTERVENTION
Anesthesia: Moderate Sedation | Laterality: Left

## 2018-07-25 ENCOUNTER — Emergency Department: Payer: Medicare Other

## 2018-07-25 ENCOUNTER — Other Ambulatory Visit: Payer: Self-pay

## 2018-07-25 ENCOUNTER — Inpatient Hospital Stay
Admission: EM | Admit: 2018-07-25 | Discharge: 2018-07-31 | DRG: 871 | Disposition: A | Discharge status: Home Health | Payer: Medicare Other | Attending: Internal Medicine | Admitting: Internal Medicine

## 2018-07-25 DIAGNOSIS — E860 Dehydration: Secondary | ICD-10-CM | POA: Diagnosis present

## 2018-07-25 DIAGNOSIS — Z7901 Long term (current) use of anticoagulants: Secondary | ICD-10-CM

## 2018-07-25 DIAGNOSIS — N10 Acute tubulo-interstitial nephritis: Secondary | ICD-10-CM | POA: Diagnosis present

## 2018-07-25 DIAGNOSIS — I255 Ischemic cardiomyopathy: Secondary | ICD-10-CM | POA: Diagnosis present

## 2018-07-25 DIAGNOSIS — I6521 Occlusion and stenosis of right carotid artery: Secondary | ICD-10-CM | POA: Diagnosis present

## 2018-07-25 DIAGNOSIS — Z8673 Personal history of transient ischemic attack (TIA), and cerebral infarction without residual deficits: Secondary | ICD-10-CM | POA: Diagnosis not present

## 2018-07-25 DIAGNOSIS — I4819 Other persistent atrial fibrillation: Secondary | ICD-10-CM | POA: Diagnosis present

## 2018-07-25 DIAGNOSIS — G451 Carotid artery syndrome (hemispheric): Secondary | ICD-10-CM | POA: Diagnosis present

## 2018-07-25 DIAGNOSIS — Z955 Presence of coronary angioplasty implant and graft: Secondary | ICD-10-CM | POA: Diagnosis not present

## 2018-07-25 DIAGNOSIS — J449 Chronic obstructive pulmonary disease, unspecified: Secondary | ICD-10-CM | POA: Diagnosis present

## 2018-07-25 DIAGNOSIS — J9601 Acute respiratory failure with hypoxia: Secondary | ICD-10-CM | POA: Diagnosis present

## 2018-07-25 DIAGNOSIS — Z7951 Long term (current) use of inhaled steroids: Secondary | ICD-10-CM

## 2018-07-25 DIAGNOSIS — I5020 Unspecified systolic (congestive) heart failure: Secondary | ICD-10-CM | POA: Diagnosis present

## 2018-07-25 DIAGNOSIS — R6520 Severe sepsis without septic shock: Secondary | ICD-10-CM | POA: Diagnosis present

## 2018-07-25 DIAGNOSIS — Z8249 Family history of ischemic heart disease and other diseases of the circulatory system: Secondary | ICD-10-CM

## 2018-07-25 DIAGNOSIS — N170 Acute kidney failure with tubular necrosis: Secondary | ICD-10-CM | POA: Diagnosis present

## 2018-07-25 DIAGNOSIS — I4891 Unspecified atrial fibrillation: Secondary | ICD-10-CM | POA: Diagnosis present

## 2018-07-25 DIAGNOSIS — N390 Urinary tract infection, site not specified: Secondary | ICD-10-CM

## 2018-07-25 DIAGNOSIS — I5042 Chronic combined systolic (congestive) and diastolic (congestive) heart failure: Secondary | ICD-10-CM | POA: Diagnosis present

## 2018-07-25 DIAGNOSIS — E119 Type 2 diabetes mellitus without complications: Secondary | ICD-10-CM

## 2018-07-25 DIAGNOSIS — J441 Chronic obstructive pulmonary disease with (acute) exacerbation: Secondary | ICD-10-CM | POA: Diagnosis present

## 2018-07-25 DIAGNOSIS — Z87891 Personal history of nicotine dependence: Secondary | ICD-10-CM | POA: Diagnosis not present

## 2018-07-25 DIAGNOSIS — G4733 Obstructive sleep apnea (adult) (pediatric): Secondary | ICD-10-CM | POA: Diagnosis present

## 2018-07-25 DIAGNOSIS — E785 Hyperlipidemia, unspecified: Secondary | ICD-10-CM | POA: Diagnosis present

## 2018-07-25 DIAGNOSIS — K5900 Constipation, unspecified: Secondary | ICD-10-CM | POA: Diagnosis present

## 2018-07-25 DIAGNOSIS — A419 Sepsis, unspecified organism: Secondary | ICD-10-CM | POA: Diagnosis present

## 2018-07-25 DIAGNOSIS — N189 Chronic kidney disease, unspecified: Secondary | ICD-10-CM | POA: Diagnosis present

## 2018-07-25 DIAGNOSIS — I130 Hypertensive heart and chronic kidney disease with heart failure and stage 1 through stage 4 chronic kidney disease, or unspecified chronic kidney disease: Secondary | ICD-10-CM | POA: Diagnosis present

## 2018-07-25 DIAGNOSIS — I248 Other forms of acute ischemic heart disease: Secondary | ICD-10-CM | POA: Diagnosis present

## 2018-07-25 DIAGNOSIS — I499 Cardiac arrhythmia, unspecified: Secondary | ICD-10-CM

## 2018-07-25 DIAGNOSIS — R112 Nausea with vomiting, unspecified: Secondary | ICD-10-CM | POA: Diagnosis present

## 2018-07-25 DIAGNOSIS — I2510 Atherosclerotic heart disease of native coronary artery without angina pectoris: Secondary | ICD-10-CM | POA: Diagnosis present

## 2018-07-25 DIAGNOSIS — E872 Acidosis, unspecified: Secondary | ICD-10-CM | POA: Diagnosis present

## 2018-07-25 DIAGNOSIS — Z79899 Other long term (current) drug therapy: Secondary | ICD-10-CM

## 2018-07-25 DIAGNOSIS — I4892 Unspecified atrial flutter: Secondary | ICD-10-CM | POA: Diagnosis present

## 2018-07-25 DIAGNOSIS — Z886 Allergy status to analgesic agent status: Secondary | ICD-10-CM

## 2018-07-25 DIAGNOSIS — Z720 Tobacco use: Secondary | ICD-10-CM | POA: Diagnosis present

## 2018-07-25 DIAGNOSIS — Z7984 Long term (current) use of oral hypoglycemic drugs: Secondary | ICD-10-CM

## 2018-07-25 DIAGNOSIS — N179 Acute kidney failure, unspecified: Secondary | ICD-10-CM | POA: Diagnosis present

## 2018-07-25 DIAGNOSIS — E1122 Type 2 diabetes mellitus with diabetic chronic kidney disease: Secondary | ICD-10-CM | POA: Diagnosis present

## 2018-07-25 HISTORY — DX: Ischemic cardiomyopathy: I25.5

## 2018-07-25 HISTORY — DX: Other persistent atrial fibrillation: I48.19

## 2018-07-25 HISTORY — DX: Disorder of arteries and arterioles, unspecified: I77.9

## 2018-07-25 HISTORY — DX: Peripheral vascular disease, unspecified: I73.9

## 2018-07-25 HISTORY — DX: Cerebral infarction, unspecified: I63.9

## 2018-07-25 LAB — CBC WITH DIFFERENTIAL/PLATELET
Abs Immature Granulocytes: 0.12 10*3/uL — ABNORMAL HIGH (ref 0.00–0.07)
Basophils Absolute: 0 10*3/uL (ref 0.0–0.1)
Basophils Relative: 0 %
Eosinophils Absolute: 0 10*3/uL (ref 0.0–0.5)
Eosinophils Relative: 0 %
HCT: 53.8 % — ABNORMAL HIGH (ref 39.0–52.0)
Hemoglobin: 17.2 g/dL — ABNORMAL HIGH (ref 13.0–17.0)
Immature Granulocytes: 1 %
LYMPHS PCT: 9 %
Lymphs Abs: 1.7 10*3/uL (ref 0.7–4.0)
MCH: 29.4 pg (ref 26.0–34.0)
MCHC: 32 g/dL (ref 30.0–36.0)
MCV: 92 fL (ref 80.0–100.0)
MONO ABS: 1.2 10*3/uL — AB (ref 0.1–1.0)
Monocytes Relative: 6 %
NEUTROS ABS: 15.3 10*3/uL — AB (ref 1.7–7.7)
Neutrophils Relative %: 84 %
Platelets: 197 10*3/uL (ref 150–400)
RBC: 5.85 MIL/uL — ABNORMAL HIGH (ref 4.22–5.81)
RDW: 18 % — ABNORMAL HIGH (ref 11.5–15.5)
WBC: 18.3 10*3/uL — ABNORMAL HIGH (ref 4.0–10.5)
nRBC: 0.3 % — ABNORMAL HIGH (ref 0.0–0.2)

## 2018-07-25 LAB — COMPREHENSIVE METABOLIC PANEL
ALT: 37 U/L (ref 0–44)
AST: 48 U/L — ABNORMAL HIGH (ref 15–41)
Albumin: 3.4 g/dL — ABNORMAL LOW (ref 3.5–5.0)
Alkaline Phosphatase: 131 U/L — ABNORMAL HIGH (ref 38–126)
Anion gap: 16 — ABNORMAL HIGH (ref 5–15)
BUN: 59 mg/dL — ABNORMAL HIGH (ref 8–23)
CO2: 23 mmol/L (ref 22–32)
Calcium: 9.3 mg/dL (ref 8.9–10.3)
Chloride: 94 mmol/L — ABNORMAL LOW (ref 98–111)
Creatinine, Ser: 2.43 mg/dL — ABNORMAL HIGH (ref 0.61–1.24)
GFR calc Af Amer: 32 mL/min — ABNORMAL LOW (ref >60)
GFR calc non Af Amer: 28 mL/min — ABNORMAL LOW (ref >60)
Glucose, Bld: 123 mg/dL — ABNORMAL HIGH (ref 70–99)
POTASSIUM: 4.3 mmol/L (ref 3.5–5.1)
Sodium: 133 mmol/L — ABNORMAL LOW (ref 135–145)
Total Bilirubin: 6.5 mg/dL — ABNORMAL HIGH (ref 0.3–1.2)
Total Protein: 7.1 g/dL (ref 6.5–8.1)

## 2018-07-25 LAB — TROPONIN I: Troponin I: 0.2 ng/mL (ref <0.03)

## 2018-07-25 LAB — PROCALCITONIN: PROCALCITONIN: 8.4 ng/mL

## 2018-07-25 LAB — BASIC METABOLIC PANEL
Anion gap: 16 — ABNORMAL HIGH (ref 5–15)
BUN: 60 mg/dL — ABNORMAL HIGH (ref 8–23)
CO2: 15 mmol/L — ABNORMAL LOW (ref 22–32)
Calcium: 8.3 mg/dL — ABNORMAL LOW (ref 8.9–10.3)
Chloride: 104 mmol/L (ref 98–111)
Creatinine, Ser: 2.31 mg/dL — ABNORMAL HIGH (ref 0.61–1.24)
GFR calc Af Amer: 34 mL/min — ABNORMAL LOW (ref >60)
GFR calc non Af Amer: 29 mL/min — ABNORMAL LOW (ref >60)
Glucose, Bld: 120 mg/dL — ABNORMAL HIGH (ref 70–99)
Potassium: 4.1 mmol/L (ref 3.5–5.1)
Sodium: 135 mmol/L (ref 135–145)

## 2018-07-25 LAB — URINALYSIS, ROUTINE W REFLEX MICROSCOPIC
Bacteria, UA: NONE SEEN
Bilirubin Urine: NEGATIVE
Glucose, UA: 150 mg/dL — AB
Ketones, ur: NEGATIVE mg/dL
Leukocytes, UA: NEGATIVE
Nitrite: NEGATIVE
Protein, ur: 30 mg/dL — AB
Specific Gravity, Urine: 1.018 (ref 1.005–1.030)
pH: 5 (ref 5.0–8.0)

## 2018-07-25 LAB — MAGNESIUM: MAGNESIUM: 2.5 mg/dL — AB (ref 1.7–2.4)

## 2018-07-25 LAB — LACTIC ACID, PLASMA
LACTIC ACID, VENOUS: 3.2 mmol/L — AB (ref 0.5–1.9)
Lactic Acid, Venous: 2.5 mmol/L (ref 0.5–1.9)

## 2018-07-25 LAB — MRSA PCR SCREENING: MRSA by PCR: NEGATIVE

## 2018-07-25 LAB — INFLUENZA PANEL BY PCR (TYPE A & B)
Influenza A By PCR: NEGATIVE
Influenza B By PCR: NEGATIVE

## 2018-07-25 LAB — GLUCOSE, CAPILLARY
Glucose-Capillary: 97 mg/dL (ref 70–99)
Glucose-Capillary: 106 mg/dL — ABNORMAL HIGH (ref 70–99)
Glucose-Capillary: 111 mg/dL — ABNORMAL HIGH (ref 70–99)

## 2018-07-25 LAB — PROTIME-INR
INR: 1.51
Prothrombin Time: 18 seconds — ABNORMAL HIGH (ref 11.4–15.2)

## 2018-07-25 LAB — PHOSPHORUS: Phosphorus: 5.2 mg/dL — ABNORMAL HIGH (ref 2.5–4.6)

## 2018-07-25 LAB — AMMONIA: Ammonia: 21 umol/L (ref 9–35)

## 2018-07-25 LAB — BILIRUBIN, DIRECT: Bilirubin, Direct: 3.9 mg/dL — ABNORMAL HIGH (ref 0.0–0.2)

## 2018-07-25 MED ORDER — AMIODARONE LOAD VIA INFUSION: 150.0000 mg | Freq: Once | INTRAVENOUS | Status: DC

## 2018-07-25 MED ORDER — SODIUM CHLORIDE 0.9 % IV BOLUS (SEPSIS)
1000.0000 mL | Freq: Once | INTRAVENOUS | Status: AC
  Administered 2018-07-25: 1000 mL via INTRAVENOUS

## 2018-07-25 MED ORDER — MOMETASONE FURO-FORMOTEROL FUM 200-5 MCG/ACT IN AERO
2.0000 | INHALATION_SPRAY | Freq: Two times a day (BID) | RESPIRATORY_TRACT | Status: DC
  Administered 2018-07-25 – 2018-07-31 (×11): 2 via RESPIRATORY_TRACT
  Filled 2018-07-25: qty 8.8

## 2018-07-25 MED ORDER — VANCOMYCIN HCL IN DEXTROSE 1-5 GM/200ML-% IV SOLN: 1000.0000 mg | Freq: Once | INTRAVENOUS | Status: DC

## 2018-07-25 MED ORDER — MAGNESIUM SULFATE 2 GM/50ML IV SOLN
2.0000 g | Freq: Once | INTRAVENOUS | Status: AC
  Administered 2018-07-25: 2 g via INTRAVENOUS
  Filled 2018-07-25: qty 50

## 2018-07-25 MED ORDER — SODIUM CHLORIDE 0.9 % IV SOLN
2.0000 g | INTRAVENOUS | Status: DC
  Administered 2018-07-25 – 2018-07-30 (×6): 2 g via INTRAVENOUS
  Filled 2018-07-25 (×2): qty 20
  Filled 2018-07-25: qty 2
  Filled 2018-07-25 (×2): qty 20
  Filled 2018-07-25 (×3): qty 2

## 2018-07-25 MED ORDER — ONDANSETRON HCL 4 MG PO TABS: 4.0000 mg | ORAL_TABLET | Freq: Four times a day (QID) | ORAL | Status: DC | PRN

## 2018-07-25 MED ORDER — AMIODARONE HCL IN DEXTROSE 360-4.14 MG/200ML-% IV SOLN: 30.0000 mg/h | INTRAVENOUS | Status: DC

## 2018-07-25 MED ORDER — AMIODARONE IV BOLUS ONLY 150 MG/100ML
150.0000 mg | Freq: Once | INTRAVENOUS | Status: AC
  Administered 2018-07-25: 150 mg via INTRAVENOUS
  Filled 2018-07-25: qty 100

## 2018-07-25 MED ORDER — AMIODARONE HCL IN DEXTROSE 360-4.14 MG/200ML-% IV SOLN
30.0000 mg/h | INTRAVENOUS | Status: DC
  Administered 2018-07-26 – 2018-07-28 (×5): 30 mg/h via INTRAVENOUS
  Filled 2018-07-25 (×5): qty 200

## 2018-07-25 MED ORDER — AMIODARONE HCL IN DEXTROSE 360-4.14 MG/200ML-% IV SOLN: 60.0000 mg/h | INTRAVENOUS | Status: DC

## 2018-07-25 MED ORDER — MORPHINE SULFATE (PF) 2 MG/ML IV SOLN
2.0000 mg | INTRAVENOUS | Status: DC | PRN
  Administered 2018-07-25 – 2018-07-28 (×5): 2 mg via INTRAVENOUS
  Filled 2018-07-25 (×5): qty 1

## 2018-07-25 MED ORDER — SODIUM CHLORIDE 0.9 % IV BOLUS
1000.0000 mL | Freq: Once | INTRAVENOUS | Status: AC
  Administered 2018-07-25: 1000 mL via INTRAVENOUS

## 2018-07-25 MED ORDER — DILTIAZEM HCL-DEXTROSE 100-5 MG/100ML-% IV SOLN (PREMIX)
5.0000 mg/h | INTRAVENOUS | Status: DC
  Administered 2018-07-25 – 2018-07-26 (×2): 5 mg/h via INTRAVENOUS
  Filled 2018-07-25 (×2): qty 100

## 2018-07-25 MED ORDER — DILTIAZEM HCL 25 MG/5ML IV SOLN
15.0000 mg | Freq: Once | INTRAVENOUS | Status: AC
  Administered 2018-07-25: 15 mg via INTRAVENOUS
  Filled 2018-07-25: qty 5

## 2018-07-25 MED ORDER — NITROGLYCERIN 0.4 MG SL SUBL
0.4000 mg | SUBLINGUAL_TABLET | SUBLINGUAL | Status: DC | PRN
  Administered 2018-07-29: 0.4 mg via SUBLINGUAL
  Filled 2018-07-25: qty 1

## 2018-07-25 MED ORDER — AMIODARONE HCL IN DEXTROSE 360-4.14 MG/200ML-% IV SOLN
60.0000 mg/h | INTRAVENOUS | Status: AC
  Administered 2018-07-25: 60 mg/h via INTRAVENOUS
  Filled 2018-07-25: qty 200

## 2018-07-25 MED ORDER — ONDANSETRON HCL 4 MG/2ML IJ SOLN: 4.0000 mg | Freq: Four times a day (QID) | INTRAMUSCULAR | Status: DC | PRN

## 2018-07-25 MED ORDER — ONDANSETRON HCL 4 MG/2ML IJ SOLN
4.0000 mg | Freq: Once | INTRAMUSCULAR | Status: AC
  Administered 2018-07-25: 4 mg via INTRAVENOUS
  Filled 2018-07-25: qty 2

## 2018-07-25 MED ORDER — CARVEDILOL 25 MG PO TABS
25.0000 mg | ORAL_TABLET | Freq: Two times a day (BID) | ORAL | Status: DC
  Administered 2018-07-26 – 2018-07-29 (×7): 25 mg via ORAL
  Filled 2018-07-25 (×3): qty 2
  Filled 2018-07-25: qty 1
  Filled 2018-07-25: qty 2
  Filled 2018-07-25: qty 1
  Filled 2018-07-25: qty 2

## 2018-07-25 MED ORDER — SODIUM BICARBONATE 8.4 % IV SOLN
50.0000 meq | Freq: Once | INTRAVENOUS | Status: AC
  Administered 2018-07-25: 50 meq via INTRAVENOUS
  Filled 2018-07-25: qty 50

## 2018-07-25 MED ORDER — SENNA 8.6 MG PO TABS
1.0000 | ORAL_TABLET | Freq: Two times a day (BID) | ORAL | Status: DC
  Administered 2018-07-25 – 2018-07-31 (×11): 8.6 mg via ORAL
  Filled 2018-07-25 (×11): qty 1

## 2018-07-25 MED ORDER — SODIUM CHLORIDE 0.9 % IV SOLN
INTRAVENOUS | Status: DC
  Administered 2018-07-25: 21:00:00 via INTRAVENOUS

## 2018-07-25 MED ORDER — RIVAROXABAN 15 MG PO TABS
15.0000 mg | ORAL_TABLET | Freq: Every day | ORAL | Status: DC
  Administered 2018-07-26 – 2018-07-29 (×5): 15 mg via ORAL
  Filled 2018-07-25 (×6): qty 1

## 2018-07-25 MED ORDER — INSULIN ASPART 100 UNIT/ML ~~LOC~~ SOLN
0.0000 [IU] | Freq: Three times a day (TID) | SUBCUTANEOUS | Status: DC
  Administered 2018-07-26: 5 [IU] via SUBCUTANEOUS
  Administered 2018-07-26 – 2018-07-27 (×4): 3 [IU] via SUBCUTANEOUS
  Administered 2018-07-28: 2 [IU] via SUBCUTANEOUS
  Administered 2018-07-28: 3 [IU] via SUBCUTANEOUS
  Administered 2018-07-28: 2 [IU] via SUBCUTANEOUS
  Administered 2018-07-29: 3 [IU] via SUBCUTANEOUS
  Administered 2018-07-29: 5 [IU] via SUBCUTANEOUS
  Administered 2018-07-30: 2 [IU] via SUBCUTANEOUS
  Administered 2018-07-30: 5 [IU] via SUBCUTANEOUS
  Administered 2018-07-31: 3 [IU] via SUBCUTANEOUS
  Administered 2018-07-31: 5 [IU] via SUBCUTANEOUS
  Filled 2018-07-25 (×14): qty 1

## 2018-07-25 MED ORDER — DILTIAZEM HCL 25 MG/5ML IV SOLN
20.0000 mg | Freq: Once | INTRAVENOUS | Status: AC
  Administered 2018-07-25: 20 mg via INTRAVENOUS
  Filled 2018-07-25: qty 5

## 2018-07-25 MED ORDER — SODIUM CHLORIDE 0.9 % IV SOLN: INTRAVENOUS | Status: AC

## 2018-07-25 MED ORDER — GUAIFENESIN-CODEINE 100-10 MG/5ML PO SOLN
10.0000 mL | ORAL | Status: DC | PRN
  Administered 2018-07-25: 10 mL via ORAL
  Filled 2018-07-25: qty 10

## 2018-07-25 MED ORDER — INSULIN ASPART 100 UNIT/ML ~~LOC~~ SOLN: 0.0000 [IU] | Freq: Every day | SUBCUTANEOUS | Status: DC

## 2018-07-25 MED ORDER — PIPERACILLIN-TAZOBACTAM 3.375 G IVPB 30 MIN
3.3750 g | Freq: Once | INTRAVENOUS | Status: AC
  Administered 2018-07-25: 3.375 g via INTRAVENOUS
  Filled 2018-07-25: qty 50

## 2018-07-25 MED ORDER — TRAZODONE HCL 50 MG PO TABS
50.0000 mg | ORAL_TABLET | Freq: Every day | ORAL | Status: DC
  Administered 2018-07-25 – 2018-07-30 (×6): 50 mg via ORAL
  Filled 2018-07-25 (×6): qty 1

## 2018-07-25 MED ORDER — DOCUSATE SODIUM 100 MG PO CAPS
100.0000 mg | ORAL_CAPSULE | Freq: Two times a day (BID) | ORAL | Status: DC
  Administered 2018-07-25 – 2018-07-31 (×11): 100 mg via ORAL
  Filled 2018-07-25 (×11): qty 1

## 2018-07-25 MED ORDER — PIPERACILLIN-TAZOBACTAM 3.375 G IVPB: 3.3750 g | Freq: Three times a day (TID) | INTRAVENOUS | Status: DC

## 2018-07-25 MED ORDER — STERILE WATER FOR INJECTION IV SOLN
INTRAVENOUS | Status: DC
  Administered 2018-07-25 – 2018-07-26 (×3): via INTRAVENOUS
  Filled 2018-07-25 (×4): qty 850

## 2018-07-25 NOTE — Progress Notes (Signed)
Notified NP of critical lab results. Acknowledged. No new orders received at this time.

## 2018-07-25 NOTE — Progress Notes (Signed)
Patient alert and oriented, admitted from ED to ICU 16. HR 130-160's. Cardizem gtt infusing at 5 increased to 10 at time of transfer.

## 2018-07-25 NOTE — Progress Notes (Signed)
Received critical lab results.

## 2018-07-25 NOTE — ED Notes (Signed)
Pt states he is hot and would like some ice. Oral temp is now 97.5. Temp on bair hugger turned down to ambient. Will recheck rectal temp when 2nd bag of fluid is complete.

## 2018-07-25 NOTE — ED Notes (Signed)
Admitting MD at bedside.

## 2018-07-25 NOTE — ED Notes (Signed)
Pt to CT. Temp has not improved with blankets and bear hugger. Pt c/o being hot. Pt is more alert and able to verbalize. Told pt he could come off the bear hugger for his CT but would need to be placed back on it when he returned.

## 2018-07-25 NOTE — ED Provider Notes (Signed)
[facility] Emergency Department Provider Note  ____________________________________________  Time seen: Approximately 4:19 PM  I have reviewed the triage vital signs and the nursing notes.   HISTORY  Chief Complaint Nausea and Emesis   HPI [name] is a 63 y.o. male with a history of atrial fibrillation on Xarelto, CAD, CHF with EF of 30 to 35%, COPD, diabetes, hypertension, hyperlipidemia, CVA who presents for evaluation of nausea and vomiting.  Patient reports that he has been unable to keep anything down for a week.  Has been vomiting.  No vomiting today.  Is complaining of diffuse body aches and chills.  Has not had a bowel movement for a week.  No diarrhea.  He is complaining of diffuse sharp abdominal pain.  Also has a cough productive of white sputum.  His not sure if he had a fever at home.  He denies chest pain but endorses shortness of breath with exertion.  Family was trying to get the patient to the doctor's office today however they did not have time for an appointment so EMS was called.  Patient was found to be in A. fib with RVR, hypothermi.   Past Medical History:  Diagnosis Date  . Atrial fibrillation (HCC)   . CAD in native artery    a. stress echo 12/2007 abnl, EF > 55%, b. LHC 01/28/08: mLAD 30, D1 40, dLCx 70, pRCA 30, mRCA 70, mRCA lesion 2 80, PDA 90, s/p PCI/BMS to prox and distal RCA, s/p PCI/BMS to PDA; c. patient reports PCI/stenting x 2 in early 2017 at the [facility] (no records on file) d. 06/2016: cath showing patent stents along RCA and LCx with moderate 40% stenosis along the LAD.   [name]. Chronic combined systolic (congestive) and diastolic (congestive) heart failure (HCC)    a. echo 2009: > 55% b. 06/2016: EF 25-30% by echo in 06/2016, cath showing patent stents with moderate disease along LAD  . COPD (chronic obstructive pulmonary disease) (HCC)   . Diabetes mellitus without complication (HCC)   . Gout   . Hyperlipidemia   .  Hypertension   . Hypertensive heart disease   . Obesity   . Tobacco abuse     Patient Active Problem List   Diagnosis Date Noted  . Sepsis secondary to UTI (HCC) 07/25/2018  . Lactic acidosis 07/25/2018  . Nausea and vomiting 07/25/2018  . AKI (acute kidney injury) (HCC) 07/25/2018  . Hyperbilirubinemia 07/25/2018  . Sepsis (HCC) 07/25/2018  . Acute CVA (cerebrovascular accident) (HCC) 06/14/2018  . Slurred speech 06/13/2018  . Carotid stenosis 06/13/2018  . Elevated troponin 06/13/2018  . Atrial fibrillation, chronic 06/13/2018  . Type 2 diabetes mellitus without complication (HCC) 06/13/2018  . TIA (transient ischemic attack) 06/13/2018  . Acute respiratory failure with hypoxia (HCC) 01/19/2018  . Atrial fibrillation with RVR (HCC) 03/26/2017  . Atrial flutter with rapid ventricular response (HCC) 02/18/2017  . Diastolic CHF, acute on chronic (HCC) 01/06/2017  . CAD in native artery   . Chest pain 01/05/2017  . Ischemic cardiomyopathy 07/26/2016  . Non-sustained ventricular tachycardia (HCC) 07/26/2016  . COPD (chronic obstructive pulmonary disease) (HCC) 07/24/2016  . CAD (coronary artery disease) 07/24/2016  . HLD (hyperlipidemia) 07/24/2016  . Acute on chronic combined systolic and diastolic CHF (congestive heart failure) (HCC) 07/24/2016  . Acute respiratory distress 07/24/2016  . Tobacco abuse 07/24/2016  . Accelerated hypertension 07/24/2016  . Hypertensive heart disease 07/24/2016    Past Surgical History:  Procedure Laterality Date  .  CARDIAC CATHETERIZATION  2009   [facility];   . CARDIAC CATHETERIZATION  2010  . CARDIAC CATHETERIZATION N/A 07/28/2016   Procedure: Right and Left Heart Cath and possible PCI;  Surgeon: Iran Ouch, MD;  Location: [facility] INVASIVE CV LAB;  Service: Cardiovascular;  Laterality: N/A;  . CARDIOVERSION N/A 03/27/2017   Procedure: CARDIOVERSION;  Surgeon: Iran Ouch, MD;  Location: [facility] ORS;  Service: Cardiovascular;  Laterality:  N/A;  . CORONARY ANGIOPLASTY  2009   s/p stent placement at [facility].    Prior to Admission medications   Medication Sig Start Date End Date Taking? Authorizing Provider  albuterol-ipratropium (COMBIVENT) 18-103 MCG/ACT inhaler Inhale 2 puffs into the lungs every 4 (four) hours as needed for wheezing.   Yes [provider]  atorvastatin (LIPITOR) 40 MG tablet Take 40 mg by mouth daily.    Yes [provider]  budesonide-formoterol (SYMBICORT) 160-4.5 MCG/ACT inhaler Inhale 2 puffs into the lungs 2 (two) times daily.   Yes [provider]  carvedilol (COREG) 25 MG tablet Take 1 tablet (25 mg total) by mouth 2 (two) times daily with a meal. 07/31/16  Yes Enedina Finner, MD  metFORMIN (GLUCOPHAGE) 1000 MG tablet Take 1,000 mg by mouth 2 (two) times daily with a meal.    Yes [provider]  methocarbamol (ROBAXIN) 500 MG tablet Take 1 tablet (500 mg total) by mouth 4 (four) times daily. 12/17/17  Yes Bridget Hartshorn L, PA-C  potassium chloride SA (K-DUR,KLOR-CON) 20 MEQ tablet Take 1 tablet (20 mEq total) by mouth daily. Patient taking differently: Take 40 mEq by mouth daily.  01/22/18  Yes Shaune Pollack, MD  rivaroxaban (XARELTO) 20 MG TABS tablet Take 1 tablet (20 mg total) by mouth daily with supper. 02/19/17  Yes Adrian Saran, MD  spironolactone (ALDACTONE) 25 MG tablet Take 1 tablet (25 mg total) by mouth daily. 08/01/16  Yes Enedina Finner, MD  torsemide (DEMADEX) 20 MG tablet Take 2 tablets (40 mg total) by mouth 2 (two) times daily. 01/21/18  Yes Shaune Pollack, MD  traZODone (DESYREL) 50 MG tablet Take 50 mg by mouth at bedtime.   Yes [provider]  docusate sodium (COLACE) 100 MG capsule Take 100 mg by mouth 2 (two) times daily.    [provider]  nitroGLYCERIN (NITROSTAT) 0.4 MG SL tablet Place 0.4 mg under the tongue every 5 (five) minutes as needed for chest pain.    [provider]    Allergies Aspirin; Ibuprofen; and Tylenol  [acetaminophen]  Family History  Problem Relation Age of Onset  . Heart attack Mother 52    Social History Social History   Tobacco Use  . Smoking status: Former Smoker    Packs/day: 1.00    Years: 35.00    Pack years: 35.00    Types: Cigarettes  . Smokeless tobacco: Never Used  Substance Use Topics  . Alcohol use: Not Currently    Comment: Quit 8 yrs.  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 Drug use: No    Review of Systems  Constitutional: Negative for fever. + chills and body aches Eyes: Negative for visual changes. ENT: Negative for sore throat. Neck: No neck pain  Cardiovascular: Negative for chest pain. Respiratory: + shortness of breath, cough Gastrointestinal: + abdominal pain, nausea, vomiting, constipation. No diarrhea. Genitourinary: Negative for dysuria. Musculoskeletal: Negative for back pain. Skin: Negative for rash. Neurological: Negative for headaches, weakness or numbness. Psych: No SI or HI  ____________________________________________   PHYSICAL EXAM:  VITAL SIGNS: ED Triage Vitals  Enc Vitals Group     BP 07/25/18 1547 101/85     Pulse Rate 07/25/18 1547 (!) 169     Resp 07/25/18 1547 (!) 32     Temp 07/25/18 1547 (!) 96.3 F (35.7 C)     Temp Source 07/25/18 1547 Rectal     SpO2 --      Weight 07/25/18 1548 224 lb (101.6 kg)     Height 07/25/18 1548 5\' 6"  (1.676 m)     Head Circumference --      Peak Flow --      Pain Score 07/25/18 1547 8     Pain Loc --      Pain Edu? --      Excl. in GC? --     Constitutional: Alert and oriented, looks dry on exam, no distress.  HEENT:      Head: Normocephalic and atraumatic.         Eyes: Conjunctivae are normal. Sclera is icteric.       Mouth/Throat: Mucous membranes are dry.       Neck: Supple with no signs of meningismus. Cardiovascular: Irregularly irregular rhythm with tachycardic. No murmurs, gallops, or rubs. 2+ symmetrical distal pulses are present in all extremities. No JVD. Respiratory:  Tachypneic, no hypoxia, diminished air movement bilaterally  Gastrointestinal: Soft, mild diffuse tenderness to palpation, and non distended with positive bowel sounds. No rebound or guarding. Musculoskeletal: Nontender with normal range of motion in all extremities. No edema, cyanosis, or erythema of extremities. Neurologic: Normal speech and language. Face is symmetric. Moving all extremities. No gross focal neurologic deficits are appreciated. Skin: Skin is warm, dry and intact. No rash noted. Psychiatric: Mood and affect are normal. Speech and behavior are normal.  ____________________________________________   LABS (all labs ordered are listed, but only abnormal results are displayed)  Labs Reviewed  LACTIC ACID, PLASMA - Abnormal; Notable for the following components:      Result Value   Lactic Acid, Venous 3.2 (*)    All other components within normal limits  COMPREHENSIVE METABOLIC PANEL - Abnormal; Notable for the following components:   Sodium 133 (*)    Chloride 94 (*)    Glucose, Bld 123 (*)    BUN 59 (*)    Creatinine, Ser 2.43 (*)    Albumin 3.4 (*)    AST 48 (*)    Alkaline Phosphatase 131 (*)    Total Bilirubin 6.5 (*)    GFR calc non Af Amer 28 (*)    GFR calc Af Amer 32 (*)    Anion gap 16 (*)    All other components within normal limits  CBC WITH DIFFERENTIAL/PLATELET - Abnormal; Notable for the following components:   WBC 18.3 (*)    RBC 5.85 (*)    Hemoglobin 17.2 (*)    HCT 53.8 (*)    RDW 18.0 (*)    nRBC 0.3 (*)    Neutro Abs 15.3 (*)    Monocytes Absolute 1.2 (*)    Abs Immature Granulocytes 0.12 (*)    All other components within normal limits  URINALYSIS, ROUTINE W REFLEX MICROSCOPIC - Abnormal; Notable for the following components:   Color, Urine AMBER (*)    APPearance HAZY (*)    Glucose, UA 150 (*)    Hgb urine dipstick LARGE (*)    Protein, ur 30 (*)    All other components within normal limits  PROTIME-INR - Abnormal; Notable for  the  following components:   Prothrombin Time 18.0 (*)    All other components within normal limits  BILIRUBIN, DIRECT - Abnormal; Notable for the following components:   Bilirubin, Direct 3.9 (*)    All other components within normal limits  LACTIC ACID, PLASMA - Abnormal; Notable for the following components:   Lactic Acid, Venous 2.5 (*)    All other components within normal limits  GLUCOSE, CAPILLARY - Abnormal; Notable for the following components:   Glucose-Capillary 106 (*)    All other components within normal limits  TROPONIN I - Abnormal; Notable for the following components:   Troponin I 0.20 (*)    All other components within normal limits  MAGNESIUM - Abnormal; Notable for the following components:   Magnesium 2.5 (*)    All other components within normal limits  PHOSPHORUS - Abnormal; Notable for the following components:   Phosphorus 5.2 (*)    All other components within normal limits  BASIC METABOLIC PANEL - Abnormal; Notable for the following components:   CO2 15 (*)    Glucose, Bld 120 (*)    BUN 60 (*)    Creatinine, Ser 2.31 (*)    Calcium 8.3 (*)    GFR calc non Af Amer 29 (*)    GFR calc Af Amer 34 (*)    Anion gap 16 (*)    All other components within normal limits  GLUCOSE, CAPILLARY - Abnormal; Notable for the following components:   Glucose-Capillary 111 (*)    All other components within normal limits  MRSA PCR SCREENING  CULTURE, BLOOD (ROUTINE X 2)  CULTURE, BLOOD (ROUTINE X 2)  GLUCOSE, CAPILLARY  INFLUENZA PANEL BY PCR (TYPE A & B)  AMMONIA  PROCALCITONIN  LACTIC ACID, PLASMA  HEPATITIS PANEL, ACUTE  COMPREHENSIVE METABOLIC PANEL  PROTIME-INR  CBC  LACTIC ACID, PLASMA  TROPONIN I  PROCALCITONIN   ____________________________________________  EKG  ED ECG REPORT I, Nita Sickle, the attending physician, personally viewed and interpreted this ECG.   Atrial fibrillation, rate of 156, normal QTC, left axis deviation, old inferior and  anterior infarcts.  Inferior infarct is new when compared to prior but no other changes seen. ____________________________________________  RADIOLOGY  I have personally reviewed the images performed during this visit and I agree with the Radiologist's read.   Interpretation by Radiologist:  Ct Abdomen Pelvis Wo Contrast  Result Date: 07/25/2018 CLINICAL DATA:  Abdomen and pelvic pain. Nausea and vomiting for 7 days. Constipation. Elevated total bilirubin. EXAM: CT ABDOMEN AND PELVIS WITHOUT CONTRAST TECHNIQUE: Multidetector CT imaging of the abdomen and pelvis was performed following the standard protocol without IV contrast. COMPARISON:  Renal ultrasound dated 01/20/2018. Abdomen and pelvis CT dated 01/15/2012. FINDINGS: Lower chest: Enlarged heart. Coronary artery calcifications and stents. Hepatobiliary: [place] artery atheromatous calcifications in the liver. Prominent caudate lobe. Normal appearing gallbladder. Pancreas: Unremarkable. No pancreatic ductal dilatation or surrounding inflammatory changes. Spleen: Normal in size without focal abnormality. Adrenals/Urinary Tract: The left kidney remains larger than the right kidney. Interval bilateral perinephric soft tissue stranding. Arterial calcifications with no definite renal calculi. Normal appearing adrenal glands, ureters and urinary bladder. Stomach/Bowel: Unremarkable stomach, small bowel and colon. No evidence of appendicitis. Vascular/Lymphatic: Atheromatous arterial calcifications without aneurysm. No enlarged lymph nodes. Reproductive: Normal sized prostate gland containing coarse calcifications. Other: Small bilateral inguinal hernias containing fat. Moderate-sized right paraumbilical hernia containing fat. Musculoskeletal: Lumbar and lower thoracic spine degenerative changes. IMPRESSION: 1. Interval bilateral perinephric soft tissue stranding, possibly due  to acute pyelonephritis. 2. Moderate-sized right paraumbilical hernia containing  fat with an interval increase in size. Aortic Atherosclerosis (ICD10-I70.0). Electronically Signed   By: Beckie Salts M.D.   On: 07/25/2018 17:33   Dg Chest 1 View  Result Date: 07/25/2018 CLINICAL DATA:  Cough. Ex-smoker. EXAM: CHEST  1 VIEW COMPARISON:  03/04/2018. FINDINGS: Progressive enlargement of the cardiac silhouette. The aorta remains mildly tortuous and partially calcified. Clear lungs with normal vascularity, improved. No pleural fluid. Unremarkable bones. IMPRESSION: Progressive cardiomegaly. Otherwise, unremarkable examination. Electronically Signed   By: Beckie Salts M.D.   On: 07/25/2018 16:53      ____________________________________________   PROCEDURES  Procedure(s) performed: None Procedures Critical Care performed:  Yes  CRITICAL CARE Performed by: Nita Sickle  ?  Total critical care time: 35 min  Critical care time was exclusive of separately billable procedures and treating other patients.  Critical care was necessary to treat or prevent imminent or life-threatening deterioration.  Critical care was time spent personally by me on the following activities: development of treatment plan with patient and/or surrogate as well as nursing, discussions with consultants, evaluation of patient's response to treatment, examination of patient, obtaining history from patient or surrogate, ordering and performing treatments and interventions, ordering and review of laboratory studies, ordering and review of radiographic studies, pulse oximetry and re-evaluation of patient's condition.  ____________________________________________   INITIAL IMPRESSION / ASSESSMENT AND PLAN / ED COURSE  63 y.o. male with a history of atrial fibrillation on Xarelto, CAD, CHF with EF of 30 to 35%, COPD, diabetes, hypertension, hyperlipidemia, CVA who presents for evaluation of nausea and vomiting.  Patient arrives in A. fib with RVR, looks very dry on exam, hypothermic, tachypneic and  tachycardic.  Patient has scleral icterus which according to him is new, diffuse tender abdominal exam with no distention and positive bowel signs.  Differential diagnosis including sepsis versus severe dehydration versus small bowel obstruction versus flu versus pneumonia versus viral gastroenteritis versus pancreatitis versus hepatitis.  At this time we will try to control his rate with IV fluids.  Will hold rate controlling agents for now to prevent patient from dropping his preload in the setting of sepsis versus severe dehydration.  Labs are pending.  Flu and chest x-ray pending.   ED COURSE:  Work-up consistent with sepsis of unknown source with acute kidney failure. CXR, flu, UA, and CT a/p with no obvious source.  Unclear etiology of hyperbilirubinemia. Patient was covered with zosyn and vancomycin. A fib with RVR, given IVF with no improvement, started on cardizem drip. admitted to the Hospitalist service.    As part of my medical decision making, I reviewed the following data within the electronic MEDICAL RECORD NUMBER Nursing notes reviewed and incorporated, Labs reviewed , EKG interpreted , Old EKG reviewed, Old chart reviewed, Radiograph reviewed , Discussed with admitting physician  Notes from prior ED visits and [place] Controlled Substance Database    Pertinent labs & imaging results that were available during my care of the patient were reviewed by me and considered in my medical decision making (see chart for details).    ____________________________________________   FINAL CLINICAL IMPRESSION(S) / ED DIAGNOSES  Final diagnoses:  Sepsis with acute renal failure without septic shock, due to unspecified organism, unspecified acute renal failure type (HCC)  AKI (acute kidney injury) (HCC)  Hyperbilirubinemia  Atrial fibrillation with RVR (HCC)      NEW MEDICATIONS STARTED DURING THIS VISIT:  ED Discharge Orders    None  Note:  This document was prepared using Dragon  voice recognition software and may include unintentional dictation errors.    Don PerkingVeronese, [place], MD 07/25/18 [phone]

## 2018-07-25 NOTE — Progress Notes (Signed)
ANTICOAGULATION CONSULT NOTE - Initial Consult  Pharmacy Consult for Xarelto  Indication: atrial fibrillation  Allergies  Allergen Reactions  . Aspirin Other (See Comments)    Told not to take because of blood thinner  . Ibuprofen Nausea And Vomiting  . Tylenol [Acetaminophen] Nausea And Vomiting    Patient Measurements: Height: 5\' 6"  (167.6 cm) Weight: 224 lb (101.6 kg) IBW/kg (Calculated) : 63.8 Heparin Dosing Weight:   Vital Signs: Temp: 98.2 F (36.8 C) (01/26 1933) Temp Source: Oral (01/26 1933) BP: 111/93 (01/26 1933) Pulse Rate: 86 (01/26 1933)  Labs: Recent Labs    07/25/18 1559 07/25/18 1717  HGB 17.2*  --   HCT 53.8*  --   PLT 197  --   LABPROT  --  18.0*  INR  --  1.51  CREATININE 2.43*  --     Estimated Creatinine Clearance: 35.6 mL/min (A) (by C-G formula based on SCr of 2.43 mg/dL (H)).   Medical History: Past Medical History:  Diagnosis Date  . Atrial fibrillation (HCC)   . CAD in native artery    a. stress echo 12/2007 abnl, EF > 55%, b. LHC 01/28/08: mLAD 30, D1 40, dLCx 70, pRCA 30, mRCA 70, mRCA lesion 2 80, PDA 90, s/p PCI/BMS to prox and distal RCA, s/p PCI/BMS to PDA; c. patient reports PCI/stenting x 2 in early 2017 at the [facility] (no records on file) d. 06/2016: cath showing patent stents along RCA and LCx with moderate 40% stenosis along the LAD.   [name]. Chronic combined systolic (congestive) and diastolic (congestive) heart failure (HCC)    a. echo 2009: > 55% b. 06/2016: EF 25-30% by echo in 06/2016, cath showing patent stents with moderate disease along LAD  . COPD (chronic obstructive pulmonary disease) (HCC)   . Diabetes mellitus without complication (HCC)   . Gout   . Hyperlipidemia   . Hypertension   . Hypertensive heart disease   . Obesity   . Tobacco abuse     Medications:  Medications Prior to Admission  Medication Sig Dispense Refill Last Dose  . albuterol-ipratropium (COMBIVENT) 18-103 MCG/ACT inhaler Inhale 2 puffs into the  lungs every 4 (four) hours as needed for wheezing.   07/25/2018 at 0400  . atorvastatin (LIPITOR) 40 MG tablet Take 40 mg by mouth daily.    07/25/2018 at 0400  . budesonide-formoterol (SYMBICORT) 160-4.5 MCG/ACT inhaler Inhale 2 puffs into the lungs 2 (two) times daily.   07/25/2018 at 0400  . carvedilol (COREG) 25 MG tablet Take 1 tablet (25 mg total) by mouth 2 (two) times daily with a meal. 60 tablet 0 07/25/2018 at 0400  . metFORMIN (GLUCOPHAGE) 1000 MG tablet Take 1,000 mg by mouth 2 (two) times daily with a meal.    07/25/2018 at 0400  . methocarbamol (ROBAXIN) 500 MG tablet Take 1 tablet (500 mg total) by mouth 4 (four) times daily. 20 tablet 0 07/25/2018 at 0400  . potassium chloride SA (K-DUR,KLOR-CON) 20 MEQ tablet Take 1 tablet (20 mEq total) by mouth daily. (Patient taking differently: Take 40 mEq by mouth daily. ) 10 tablet 0 07/25/2018 at 0400  . rivaroxaban (XARELTO) 20 MG TABS tablet Take 1 tablet (20 mg total) by mouth daily with supper. 30 tablet 0 07/24/2018 at 1800  . spironolactone (ALDACTONE) 25 MG tablet Take 1 tablet (25 mg total) by mouth daily. 30 tablet 0 07/25/2018 at 0400  . torsemide (DEMADEX) 20 MG tablet Take 2 tablets (40 mg total) by mouth 2 (  two) times daily. 60 tablet 0 07/25/2018 at 0400  . traZODone (DESYREL) 50 MG tablet Take 50 mg by mouth at bedtime.   07/24/2018 at 2100  . docusate sodium (COLACE) 100 MG capsule Take 100 mg by mouth 2 (two) times daily.   prn at prn  . nitroGLYCERIN (NITROSTAT) 0.4 MG SL tablet Place 0.4 mg under the tongue every 5 (five) minutes as needed for chest pain.   prn at prn    Assessment: 63 year old male, TBW = 101.6 kg,  Ht = 66",  SrCr = 2.43  CrCl (using TBW) = 45.9 ml/min  Goal of Therapy:  prevention of thromboembolism Monitor platelets by anticoagulation protocol: Yes   Plan:  Pt originally ordered Xarelto 20 mg PO daily .   Will adjust dose to Xarelto 15 mg PO daily based on CrCl of 45.9 ml/min.   Linlee Cromie  D 07/25/2018,8:43 PM

## 2018-07-25 NOTE — ED Notes (Signed)
ED TO INPATIENT HANDOFF REPORT  Name/Age/Gender [name] 63 y.o. male  Code Status Code Status History    Date Active Date Inactive Code Status Order ID Comments User Context   06/13/2018 2049 06/15/2018 1706 Full Code [id number]  Gery Prayrosley, Debby, MD Inpatient   01/19/2018 2358 01/21/2018 1343 Full Code [id number]  Cammy CopaMaier, Angela, MD ED   03/26/2017 0636 03/27/2017 1729 Full Code [id number]  Arnaldo Nataliamond, Michael S, MD Inpatient   02/18/2017 2112 02/19/2017 1707 Full Code [id number]  Houston SirenSainani, Vivek J, MD Inpatient   01/05/2017 2246 01/07/2017 1614 Full Code [id number]  Altamese DillingVachhani, Vaibhavkumar, MD Inpatient   07/24/2016 0235 07/31/2016 1401 Full Code [id number]  Oralia ManisWillis, David, MD Inpatient      Home/SNF/Other Home  Chief Complaint Nausea, Vomiting  Level of Care/Admitting Diagnosis ED Disposition    ED Disposition Condition Comment   Admit  Hospital [facility] [[id number]]  Level of Care: Stepdown [14]  Diagnosis: Sepsis (HCC) [[id number]][[id number]]  Admitting Physician: Gery PrayROSLEY, DEBBY [[id number]]  Attending Physician: Gery PrayROSLEY, DEBBY [[id number]]  Estimated length of stay: past midnight tomorrow  Certification:: I certify this patient will need inpatient services for at least 2 midnights  PT Class (Do Not Modify): Inpatient [101]  PT Acc Code (Do Not Modify): Private [1]       Medical History Past Medical History:  Diagnosis Date  . Atrial fibrillation (HCC)   . CAD in native artery    a. stress echo 12/2007 abnl, EF > 55%, b. LHC 01/28/08: mLAD 30, D1 40, dLCx 70, pRCA 30, mRCA 70, mRCA lesion 2 80, PDA 90, s/p PCI/BMS to prox and distal RCA, s/p PCI/BMS to PDA; c. patient reports PCI/stenting x 2 in early 2017 at the [facility] (no records on file) d. 06/2016: cath showing patent stents along RCA and LCx with moderate 40% stenosis along the LAD.   [name]. Chronic combined systolic (congestive) and diastolic (congestive) heart failure (HCC)    a. echo 2009: > 55% b. 06/2016: EF 25-30% by echo in 06/2016,  cath showing patent stents with moderate disease along LAD  . COPD (chronic obstructive pulmonary disease) (HCC)   . Diabetes mellitus without complication (HCC)   . Gout   . Hyperlipidemia   . Hypertension   . Hypertensive heart disease   . Obesity   . Tobacco abuse     Allergies Allergies  Allergen Reactions  . Aspirin Other (See Comments)    Told not to take because of blood thinner  . Ibuprofen Nausea And Vomiting  . Tylenol [Acetaminophen] Nausea And Vomiting    IV Location/Drains/Wounds Patient Lines/Drains/Airways Status   Active Line/Drains/Airways    Name:   Placement date:   Placement time:   Site:   Days:   Peripheral IV 07/25/18 Right Antecubital   07/25/18    1553    Antecubital   less than 1   Peripheral IV 07/25/18 Right Wrist   07/25/18    1550    Wrist   less than 1          Labs/Imaging Results for orders placed or performed during the hospital encounter of 07/25/18 (from the past 48 hour(s))  Glucose, capillary     Status: None   Collection Time: 07/25/18  3:47 PM  Result Value Ref Range   Glucose-Capillary 97 70 - 99 mg/dL  Lactic acid, plasma     Status: Abnormal   Collection Time: 07/25/18  3:59 PM  Result Value Ref Range   Lactic Acid, Venous  3.2 (HH) 0.5 - 1.9 mmol/L    Comment: CRITICAL RESULT CALLED TO, READ BACK BY AND VERIFIED WITH SHERRIE ALLISON AT 1644 ON 07/25/2018 JJB Performed at [facility], [address] [place], [place] [address]   Comprehensive metabolic panel     Status: Abnormal   Collection Time: 07/25/18  3:59 PM  Result Value Ref Range   Sodium 133 (L) 135 - 145 mmol/L   Potassium 4.3 3.5 - 5.1 mmol/L   Chloride 94 (L) 98 - 111 mmol/L   CO2 23 22 - 32 mmol/L   Glucose, Bld 123 (H) 70 - 99 mg/dL   BUN 59 (H) 8 - 23 mg/dL   Creatinine, Ser 6.04 (H) 0.61 - 1.24 mg/dL   Calcium 9.3 8.9 - 54.0 mg/dL   Total Protein 7.1 6.5 - 8.1 g/dL   Albumin 3.4 (L) 3.5 - 5.0 g/dL   AST 48 (H) 15 - 41 U/L   ALT 37 0 - 44  U/L   Alkaline Phosphatase 131 (H) 38 - 126 U/L   Total Bilirubin 6.5 (H) 0.3 - 1.2 mg/dL   GFR calc non Af Amer 28 (L) >60 mL/min   GFR calc Af Amer 32 (L) >60 mL/min   Anion gap 16 (H) 5 - 15    Comment: Performed at [facility], [address] [place], [place] [address]  CBC WITH DIFFERENTIAL     Status: Abnormal   Collection Time: 07/25/18  3:59 PM  Result Value Ref Range   WBC 18.3 (H) 4.0 - 10.5 K/uL   RBC 5.85 (H) 4.22 - 5.81 MIL/uL   Hemoglobin 17.2 (H) 13.0 - 17.0 g/dL   HCT 14.7 (H) 82.9 - 56.2 %   MCV 92.0 80.0 - 100.0 fL   MCH 29.4 26.0 - 34.0 pg   MCHC 32.0 30.0 - 36.0 g/dL   RDW 13.0 (H) 86.5 - 78.4 %   Platelets 197 150 - 400 K/uL   nRBC 0.3 (H) 0.0 - 0.2 %   Neutrophils Relative % 84 %   Neutro Abs 15.3 (H) 1.7 - 7.7 K/uL   Lymphocytes Relative 9 %   Lymphs Abs 1.7 0.7 - 4.0 K/uL   Monocytes Relative 6 %   Monocytes Absolute 1.2 (H) 0.1 - 1.0 K/uL   Eosinophils Relative 0 %   Eosinophils Absolute 0.0 0.0 - 0.5 K/uL   Basophils Relative 0 %   Basophils Absolute 0.0 0.0 - 0.1 K/uL   Immature Granulocytes 1 %   Abs Immature Granulocytes 0.12 (H) 0.00 - 0.07 K/uL    Comment: Performed at [facility], [address] [place], [place] [address]  Influenza panel by PCR (type A & B)     Status: None   Collection Time: 07/25/18  3:59 PM  Result Value Ref Range   Influenza A By PCR NEGATIVE NEGATIVE   Influenza B By PCR NEGATIVE NEGATIVE    Comment: (NOTE) The Xpert Xpress Flu assay is intended as an aid in the diagnosis of  influenza and should not be used as a sole basis for treatment.  This  assay is FDA approved for nasopharyngeal swab specimens only. Nasal  washings and aspirates are unacceptable for Xpert Xpress Flu testing. Performed at [facility], [address] [place], [place] [address]   Bilirubin, direct     Status: Abnormal   Collection Time: 07/25/18  3:59 PM  Result Value Ref Range   Bilirubin, Direct 3.9 (H)  0.0 -  0.2 mg/dL    Comment: Performed at [facility], [address] [place], [place] [address]  Protime-INR     Status: Abnormal   Collection Time: 07/25/18  5:17 PM  Result Value Ref Range   Prothrombin Time 18.0 (H) 11.4 - 15.2 seconds   INR 1.51     Comment: Performed at [facility], [address] [place], [place] [address]  Ammonia     Status: None   Collection Time: 07/25/18  5:17 PM  Result Value Ref Range   Ammonia 21 9 - 35 umol/L    Comment: Performed at [facility], [address] [place], [place] [address]   Ct Abdomen Pelvis Wo Contrast  Result Date: 07/25/2018 CLINICAL DATA:  Abdomen and pelvic pain. Nausea and vomiting for 7 days. Constipation. Elevated total bilirubin. EXAM: CT ABDOMEN AND PELVIS WITHOUT CONTRAST TECHNIQUE: Multidetector CT imaging of the abdomen and pelvis was performed following the standard protocol without IV contrast. COMPARISON:  Renal ultrasound dated 01/20/2018. Abdomen and pelvis CT dated 01/15/2012. FINDINGS: Lower chest: Enlarged heart. Coronary artery calcifications and stents. Hepatobiliary: P[place]ock artery atheromatous calcifications in the liver. Prominent caudate lobe. Normal appearing gallbladder. Pancreas: Unremarkable. No pancreatic ductal dilatation or surrounding inflammatory changes. Spleen: Normal in size without focal abnormality. Adrenals/Urinary Tract: The left kidney remains larger than the right kidney. Interval bilateral perinephric soft tissue stranding. Arterial calcifications with no definite renal calculi. Normal appearing adrenal glands, ureters and urinary bladder. Stomach/Bowel: Unremarkable stomach, small bowel and colon. No evidence of appendicitis. Vascular/Lymphatic: Atheromatous arterial calcifications without aneurysm. No enlarged lymph nodes. Reproductive: Normal sized prostate gland containing coarse calcifications. Other: Small bilateral inguinal hernias containing fat. Moderate-sized  right paraumbilical hernia containing fat. Musculoskeletal: Lumbar and lower thoracic spine degenerative changes. IMPRESSION: 1. Interval bilateral perinephric soft tissue stranding, possibly due to acute pyelonephritis. 2. Moderate-sized right paraumbilical hernia containing fat with an interval increase in size. Aortic Atherosclerosis (ICD10-I70.0). Electronically Signed   By: Beckie Salts M.D.   On: 07/25/2018 17:33   Dg Chest 1 View  Result Date: 07/25/2018 CLINICAL DATA:  Cough. Ex-smoker. EXAM: CHEST  1 VIEW COMPARISON:  03/04/2018. FINDINGS: Progressive enlargement of the cardiac silhouette. The aorta remains mildly tortuous and partially calcified. Clear lungs with normal vascularity, improved. No pleural fluid. Unremarkable bones. IMPRESSION: Progressive cardiomegaly. Otherwise, unremarkable examination. Electronically Signed   By: Beckie Salts M.D.   On: 07/25/2018 16:53    Pending Labs Unresulted Labs (From admission, onward)    Start     Ordered   07/25/18 1846  Lactic acid, plasma  Once,   STAT     07/25/18 1845   07/25/18 1552  Lactic acid, plasma  Now then every 2 hours,   STAT     07/25/18 1552   07/25/18 1552  Blood Culture (routine x 2)  BLOOD CULTURE X 2,   STAT     07/25/18 1552   07/25/18 1552  Urinalysis, Routine w reflex microscopic  ONCE - STAT,   STAT     07/25/18 1552   Signed and Held  Hepatitis panel, acute  Once,   R     Signed and Held   Signed and Held  Comprehensive metabolic panel  Tomorrow morning,   R     Signed and Held   Signed and Held  Protime-INR  Tomorrow morning,   R     Signed and Held   Signed and Held  CBC  Tomorrow morning,   R  Signed and Held   Signed and Held  Lactic acid, plasma  Tomorrow morning,   STAT     Signed and Held   Signed and Held  Troponin I - Now Then Q6H  Now then every 6 hours,   R     Signed and Held          Vitals/Pain Today's Vitals   07/25/18 1700 07/25/18 1715 07/25/18 1921 07/25/18 1933  BP:  (!) 111/93   (!) 111/93  Pulse:    86  Resp:  (!) 9  20  Temp: (!) 95.6 F (35.3 C)  98.2 F (36.8 C) 98.2 F (36.8 C)  TempSrc: Rectal  Oral Oral  SpO2:    96%  Weight:      Height:      PainSc:        Isolation Precautions Droplet precaution  Medications Medications  diltiazem (CARDIZEM) 100 mg in dextrose 5% 100mL (1 mg/mL) infusion (5 mg/hr Intravenous New Bag/Given 07/25/18 1851)  piperacillin-tazobactam (ZOSYN) IVPB 3.375 g (has no administration in time range)  sodium chloride 0.9 % bolus 1,000 mL (0 mLs Intravenous Stopped 07/25/18 1650)  ondansetron (ZOFRAN) injection 4 mg (4 mg Intravenous Given 07/25/18 1601)  magnesium sulfate IVPB 2 g 50 mL (0 g Intravenous Stopped 07/25/18 1745)  piperacillin-tazobactam (ZOSYN) IVPB 3.375 g (0 g Intravenous Stopped 07/25/18 1758)  sodium chloride 0.9 % bolus 1,000 mL (0 mLs Intravenous Stopped 07/25/18 1645)  sodium chloride 0.9 % bolus 1,000 mL (0 mLs Intravenous Stopped 07/25/18 1758)  diltiazem (CARDIZEM) injection 15 mg (15 mg Intravenous Given 07/25/18 1712)  diltiazem (CARDIZEM) injection 20 mg (20 mg Intravenous Given 07/25/18 1852)    Mobility walks with person assist

## 2018-07-25 NOTE — ED Notes (Signed)
Fluids placed on pressure bags

## 2018-07-25 NOTE — ED Notes (Signed)
Pt back from CT. Labs drawn and sent. Pt placed back on bear hugger.

## 2018-07-25 NOTE — Consult Note (Addendum)
PULMONARY / CRITICAL CARE MEDICINE  Name: [name] MRN: [id number] DOB: 1956-06-04    LOS: 0  Referring Provider: Dr. Joneen Roachrosley Reason for Referral: Sepsis and A. fib with RVR  HPI: This is a 63 year old African-American male with a medical history as indicated below who presented to the ED with complaints of nausea, vomiting and a generalized feeling of ill health for over a week.  He states that it all started with nausea and then progressed into vomiting and he was unable to keep anything down.  About the past week.  He also reports body aches and chills but states that he is uncertain if he did have a fever or not.  He denies diarrhea but reports diffuse abdominal pain.  He is also complaining of a productive cough and states that he is unable to get up of sputum.  He reports bilateral chest pain that is present at rest and dyspnea with minimal exertion.  He denies trying any remedies was seeing a provider.  He was trying to get an appointment with his PCP but symptoms got worse hence EMS was called.  His ED work-up was significant for A. fib with RVR with heart rate in the 180s, mild lactic acidosis with lactic acid level of 3.3, acute renal failure with creatinine of 2.43 up from his baseline of 1.08, bun of 59, anion gap 16 WBC of 18,000.  CT abdomen showed pyelonephritis.  He was admitted to the ICU for further management. On arrival in the ICU, patient's heart rate was in the 170s.  He was also complaining of bilateral chest pain.  Cardiac enzymes will obtain and his troponin was 0.20.  Lactic acid was down to 2.5 and his electrolytes were unremarkable.  He was started on amiodarone infusion in addition to diltiazem and given 1 dose of morphine for chest pain.  Reports moderate improvement in dyspnea and abdominal discomfort.  No nausea and vomiting since arrival in the ICU  Past Medical History:  Diagnosis Date  . Atrial fibrillation (HCC)   . CAD in native artery    a. stress echo  12/2007 abnl, EF > 55%, b. LHC 01/28/08: mLAD 30, D1 40, dLCx 70, pRCA 30, mRCA 70, mRCA lesion 2 80, PDA 90, s/p PCI/BMS to prox and distal RCA, s/p PCI/BMS to PDA; c. patient reports PCI/stenting x 2 in early 2017 at the [facility] (no records on file) d. 06/2016: cath showing patent stents along RCA and LCx with moderate 40% stenosis along the LAD.   [name]. Chronic combined systolic (congestive) and diastolic (congestive) heart failure (HCC)    a. echo 2009: > 55% b. 06/2016: EF 25-30% by echo in 06/2016, cath showing patent stents with moderate disease along LAD  . COPD (chronic obstructive pulmonary disease) (HCC)   . Diabetes mellitus without complication (HCC)   . Gout   . Hyperlipidemia   . Hypertension   . Hypertensive heart disease   . Obesity   . Tobacco abuse    Past Surgical History:  Procedure Laterality Date  . CARDIAC CATHETERIZATION  2009   [facility];   . CARDIAC CATHETERIZATION  2010  . CARDIAC CATHETERIZATION N/A 07/28/2016   Procedure: Right and Left Heart Cath and possible PCI;  Surgeon: Iran OuchMuhammad A Arida, MD;  Location: [facility] INVASIVE CV LAB;  Service: Cardiovascular;  Laterality: N/A;  . CARDIOVERSION N/A 03/27/2017   Procedure: CARDIOVERSION;  Surgeon: Iran OuchArida, Muhammad A, MD;  Location: [facility] ORS;  Service: Cardiovascular;  Laterality: N/A;  . CORONARY  ANGIOPLASTY  2009   s/p stent placement at [facility].   Prior to Admission medications   Medication Sig Start Date End Date Taking? Authorizing Provider  amLODipine (NORVASC) 5 MG tablet Take 5 mg by mouth daily.   Yes [provider]  clopidogrel (PLAVIX) 75 MG tablet Take 75 mg by mouth daily.   Yes [provider]  donepezil (ARICEPT) 5 MG tablet Take 1 tablet (5 mg total) by mouth at bedtime. 02/22/18 04/03/18 Yes Sowles, Danna Hefty, MD  empagliflozin (JARDIANCE) 25 MG TABS tablet Take 25 mg by mouth daily.   Yes [provider]  glycopyrrolate (ROBINUL) 1 MG tablet Take 1 mg by mouth 2 (two) times daily.   Yes  [provider]  insulin aspart (NOVOLOG FLEXPEN) 100 UNIT/ML FlexPen Inject 12 Units into the skin 2 (two) times daily.   Yes [provider]  insulin aspart (NOVOLOG) 100 UNIT/ML FlexPen Inject 18 Units into the skin daily. At 1700   Yes [provider]  Insulin Degludec-Liraglutide (XULTOPHY) 100-3.6 UNIT-MG/ML SOPN Inject 50 Units into the skin daily.   Yes [provider]  levETIRAcetam (KEPPRA) 500 MG tablet Take 500 mg by mouth 2 (two) times daily.   Yes [provider]  lipase/protease/amylase (CREON) 12000 units CPEP capsule Take 6,000 Units by mouth 3 (three) times daily before meals.   Yes [provider]  lipase/protease/amylase (CREON) 12000 units CPEP capsule Take 3,000 Units by mouth at bedtime. With snack   Yes [provider]  lisinopril (PRINIVIL,ZESTRIL) 5 MG tablet Take 5 mg by mouth daily.   Yes [provider]  metoprolol succinate (TOPROL-XL) 25 MG 24 hr tablet Take 1 tablet (25 mg total) by mouth daily. 02/22/18  Yes Sowles, Danna Hefty, MD  rosuvastatin (CRESTOR) 40 MG tablet Take 1 tablet (40 mg total) by mouth daily. 02/22/18 04/03/18 Yes Alba Cory, MD  aspirin EC 81 MG tablet Take 81 mg by mouth daily.    [provider]  famotidine (PEPCID) 20 MG tablet Take 1 tablet (20 mg total) by mouth 2 (two) times daily. 02/22/18 03/24/18  Alba Cory, MD  gabapentin (NEURONTIN) 300 MG capsule Take 1 capsule (300 mg total) by mouth 2 (two) times daily. 02/22/18 03/24/18  Alba Cory, MD  insulin glargine (LANTUS) 100 UNIT/ML injection Inject 0.1 mLs (10 Units total) into the skin daily. 02/22/18 03/24/18  Alba Cory, MD  lacosamide 100 MG TABS Take 1 tablet (100 mg total) by mouth 2 (two) times daily. Patient not taking: Reported on 04/03/2018 07/10/17   Enedina Finner, MD  promethazine (PHENERGAN) 12.5 MG tablet Take 1 tablet (12.5 mg total) by mouth every 6 (six) hours as needed for nausea or  vomiting. Patient not taking: Reported on 04/03/2018 04/28/17   Almond Lint, MD  sertraline (ZOLOFT) 25 MG tablet Take 1 tablet (25 mg total) by mouth daily. Patient not taking: Reported on 04/03/2018 02/22/18   Alba Cory, MD   Allergies Allergies  Allergen Reactions  . Aspirin Other (See Comments)    Told not to take because of blood thinner  . Ibuprofen Nausea And Vomiting  . Tylenol [Acetaminophen] Nausea And Vomiting    Family History Family History  Problem Relation Age of Onset  . Heart attack Mother 54   Social History  reports that he has quit smoking. His smoking use included cigarettes. He has a 35.00 pack-year smoking history. He has never used smokeless tobacco. He reports previous alcohol use. He reports that he does not  use drugs.  Review Of Systems:   Constitutional: Negative for fever but positive for generalized malaise and chills.  HENT: Negative for congestion and rhinorrhea.  Eyes: Negative for redness and visual disturbance.  Respiratory: Positive for shortness of breath but negative wheezing.  Cardiovascular: Positive for chest pain and palpitations.  Gastrointestinal: Reports nausea , vomiting and abdominal pain that have improved Genitourinary: Negative for dysuria and urgency.  Endocrine: Denies polyuria, polyphagia and heat intolerance Musculoskeletal: Negative for myalgias and arthralgias.  Skin: Negative for pallor and wound.  Neurological: Negative for dizziness and headaches    VITAL SIGNS: BP 114/82   Pulse 64   Temp 98.2 F (36.8 C) (Oral)   Resp 20   Ht 6' (1.829 m)   Wt 100.7 kg   SpO2 95%   BMI 30.11 kg/m   HEMODYNAMICS:    VENTILATOR SETTINGS:    INTAKE / OUTPUT: No intake/output data recorded.  PHYSICAL EXAMINATION: General: Well-nourished, well-developed, chronically ill looking HEENT: PERRLA, trachea midline, no JVD Neuro: Alert and oriented x3, no focal deficits Cardiovascular: Pulse irregular, tachycardic in  the 170s, S1-S2, no murmur regurg or gallop, +2 pulses bilaterally, +2 edema without pitting, venous stasis discoloration in bilateral lower extremities Lungs: Increased work of breathing, bilateral breath sounds, diffuse rhonchi in anterior lung fields, breath sounds diminished in the bases, no wheezing Abdomen: Nondistended, normal bowel sounds in all 4 quadrants, palpation reveals diffuse tenderness without any referred rebound tenderness Musculoskeletal: No joint deformities, positive range of motion Skin: Warm and dry  LABS:  BMET Recent Labs  Lab 07/25/18 1559  NA 133*  K 4.3  CL 94*  CO2 23  BUN 59*  CREATININE 2.43*  GLUCOSE 123*    Electrolytes Recent Labs  Lab 07/25/18 1559  CALCIUM 9.3    CBC Recent Labs  Lab 07/25/18 1559  WBC 18.3*  HGB 17.2*  HCT 53.8*  PLT 197    Coag's Recent Labs  Lab 07/25/18 1717  INR 1.51    Sepsis Markers Recent Labs  Lab 07/25/18 1559 07/25/18 2024  LATICACIDVEN 3.2* 2.5*    ABG No results for input(s): PHART, PCO2ART, PO2ART in the last 168 hours.  Liver Enzymes Recent Labs  Lab 07/25/18 1559  AST 48*  ALT 37  ALKPHOS 131*  BILITOT 6.5*  ALBUMIN 3.4*    Cardiac Enzymes Recent Labs  Lab 07/25/18 2033  TROPONINI 0.20*    Glucose Recent Labs  Lab 07/25/18 1547 07/25/18 2012  GLUCAP 97 106*    Imaging Ct Abdomen Pelvis Wo Contrast  Result Date: 07/25/2018 CLINICAL DATA:  Abdomen and pelvic pain. Nausea and vomiting for 7 days. Constipation. Elevated total bilirubin. EXAM: CT ABDOMEN AND PELVIS WITHOUT CONTRAST TECHNIQUE: Multidetector CT imaging of the abdomen and pelvis was performed following the standard protocol without IV contrast. COMPARISON:  Renal ultrasound dated 01/20/2018. Abdomen and pelvis CT dated 01/15/2012. FINDINGS: Lower chest: Enlarged heart. Coronary artery calcifications and stents. Hepatobiliary: P[place]ock artery atheromatous calcifications in the liver. Prominent caudate lobe.  Normal appearing gallbladder. Pancreas: Unremarkable. No pancreatic ductal dilatation or surrounding inflammatory changes. Spleen: Normal in size without focal abnormality. Adrenals/Urinary Tract: The left kidney remains larger than the right kidney. Interval bilateral perinephric soft tissue stranding. Arterial calcifications with no definite renal calculi. Normal appearing adrenal glands, ureters and urinary bladder. Stomach/Bowel: Unremarkable stomach, small bowel and colon. No evidence of appendicitis. Vascular/Lymphatic: Atheromatous arterial calcifications without aneurysm. No enlarged lymph nodes. Reproductive: Normal sized prostate gland containing coarse calcifications. Other: Small  bilateral inguinal hernias containing fat. Moderate-sized right paraumbilical hernia containing fat. Musculoskeletal: Lumbar and lower thoracic spine degenerative changes. IMPRESSION: 1. Interval bilateral perinephric soft tissue stranding, possibly due to acute pyelonephritis. 2. Moderate-sized right paraumbilical hernia containing fat with an interval increase in size. Aortic Atherosclerosis (ICD10-I70.0). Electronically Signed   By: Beckie Salts M.D.   On: 07/25/2018 17:33   Dg Chest 1 View  Result Date: 07/25/2018 CLINICAL DATA:  Cough. Ex-smoker. EXAM: CHEST  1 VIEW COMPARISON:  03/04/2018. FINDINGS: Progressive enlargement of the cardiac silhouette. The aorta remains mildly tortuous and partially calcified. Clear lungs with normal vascularity, improved. No pleural fluid. Unremarkable bones. IMPRESSION: Progressive cardiomegaly. Otherwise, unremarkable examination. Electronically Signed   By: Beckie Salts M.D.   On: 07/25/2018 16:53    CULTURES: Blood cultures x2 Urine culture  ANTIBIOTICS: Given cefepime and vancomycin in the ED Started on ceftriaxone in the ICU  SIGNIFICANT EVENTS: 07/25/2018 admitted 06/13/2018: Admitted with CVA  LINES/TUBES: Peripheral IVs  DISCUSSION: 63 year old male admitted  with urosepsis secondary to pyelonephritis, acute renal failure secondary to volume depletion and A. fib with RVR refractory to amiodarone and diltiazem  ASSESSMENT  Urosepsis Pyelonephritis A. fib with RVR Lactic acidosis Acute renal failure secondary to volume depletion Hyperbilirubinemia and transaminitis Type 2 diabetes CAD Productive cough without infiltrate on chest x-ray COPD Chronic systolic heart failure with reduced EF of 30 to 35%  PLAN Hemodynamic monitoring per ICU protocol We will add amiodarone bolus and infusion and if no improvement will give 1 dose of digoxin 0.25 mg IV x1 Continue diltiazem infusion Cardiology consult Cycle cardiac enzymes Gentle IV hydration in light of CHF with reduced EF Resume all home medications Blood glucose monitoring with sliding scale insulin coverage Follow-up cultures Antibiotics as above Monitor fever curve Repeat chest x-ray in the morning Monitor and correct electrolyte imbalances PRN Neo-Synephrine for mean arterial blood pressure less than 65   Best Practice: Code Status: Full code Diet: Clear liquids and advance to heart healthy diet as tolerated GI prophylaxis: Pepcid 20 mg every 24 hour VTE prophylaxis: Patient is already on full-strength anticoagulation  FAMILY  - Updates: P no family at bedside.  Patient updated on current treatment plan    Jodeci Rini S[facility] ANP-BC Pulmonary and Critical Care Medicine [facility] Pager [phone] or [phone]  NB: This document was prepared using Dragon voice recognition software and may include unintentional dictation errors.    07/25/2018, 9:19 PM

## 2018-07-25 NOTE — Progress Notes (Signed)
Pharmacy Antibiotic Note  [name] is a 63 y.o. male admitted on 07/25/2018 with sepsis.  Pharmacy has been consulted for Zosyn dosing.  Plan: Zosyn 3.375g IV q8h (4 hour infusion).  Height: 5\' 6"  (167.6 cm) Weight: 224 lb (101.6 kg) IBW/kg (Calculated) : 63.8  Temp (24hrs), Avg:96 F (35.6 C), Min:95.6 F (35.3 C), Max:96.3 F (35.7 C)  Recent Labs  Lab 07/25/18 1559  WBC 18.3*  CREATININE 2.43*  LATICACIDVEN 3.2*    Estimated Creatinine Clearance: 35.6 mL/min (A) (by C-G formula based on SCr of 2.43 mg/dL (H)).    Allergies  Allergen Reactions  . Aspirin Other (See Comments)    Told not to take because of blood thinner  . Ibuprofen Nausea And Vomiting  . Tylenol [Acetaminophen] Nausea And Vomiting    Antimicrobials this admission:   >>    >>   Dose adjustments this admission:   Microbiology results:  BCx:   UCx:    Sputum:    MRSA PCR:   Thank you for allowing pharmacy to be a part of this patient's care.  Miloh Alcocer D 07/25/2018 7:04 PM

## 2018-07-25 NOTE — Progress Notes (Signed)
CODE SEPSIS - PHARMACY COMMUNICATION  **Broad Spectrum Antibiotics should be administered within 1 hour of Sepsis diagnosis**  Time Code Sepsis Called/Page Received:  1/26 @ 1743   Antibiotics Ordered:  Zosyn   Time of 1st antibiotic administration: 1/26 @ 1712   Additional action taken by pharmacy:    If necessary, Name of Provider/Nurse Contacted:     Jahmeir Geisen D ,PharmD Clinical Pharmacist  07/25/2018  7:05 PM

## 2018-07-25 NOTE — H&P (Signed)
PCP:   [facility]   Chief Complaint:  Dizziness  HPI: This is a 63y/o male who presents with of being ill almost 2 weeks. He can't hold anything on his stomach. He's not been able to eat, everything comes up. He's constipated. He has been urinating. he denies any BOU. His urine is not foul smelling. He denies any confusion.  He reports a mucous in his chest. He denies any fevers or chills. He reports being light headed and dizzy for the last 2 weeks. He has difficulty walking this. He reports SOB, coughing but no wheezing. He reports B/L chest pains especially with his cough. He also reports heart palpitations. He states he has continued to take his medications. He has not been to see and doctor or to the UC. He finally came to the ER because he had no choice. He was not feeling better only worse. He was very weak  In the ER the patient was hypothermic and tachycardic,. Heart rates iun the 180's. His creatinine was 2.8, WBC 18, Lactic Acid of 3.3 with elevated LFt';s  History provided by the patient  Review of Systems:  The patient denies anorexia, fever, weight loss,, vision loss, decreased hearing, hoarseness, chest pain,presyncope, SOB, cough, palpitations, dyspnea on exertion, peripheral edema, balance deficits, hemoptysis, abdominal pain, melena, hematochezia, severe indigestion/heartburn, hematuria, incontinence, genital sores, muscle weakness, suspicious skin lesions, transient blindness, difficulty walking, depression, unusual weight change, abnormal bleeding, enlarged lymph nodes, angioedema, and breast masses.  Past Medical History: Past Medical History:  Diagnosis Date  . Atrial fibrillation (HCC)   . CAD in native artery    a. stress echo 12/2007 abnl, EF > 55%, b. LHC 01/28/08: mLAD 30, D1 40, dLCx 70, pRCA 30, mRCA 70, mRCA lesion 2 80, PDA 90, s/p PCI/BMS to prox and distal RCA, s/p PCI/BMS to PDA; c. patient reports PCI/st[facility] x 2 in early 2017 [facility] (no records  on file) d. 06/2016: cath showing patent stents along RCA and LCx with moderate 40% stenosis along the LAD.   [name]. Chronic combined systolic (congestive) and diastolic (congestive) heart failure (HCC)    a. echo 2009: > 55% b. 06/2016: EF 25-30% by echo in 06/2016, cath showing patent stents with moderate disease along LAD  . COPD (chronic obstructive pulmonary disease) (HCC)   . Diabetes mellitus without complication (HCC)   . Gout   . Hyperlipidemia   . Hypertension   . Hypertensive heart disease   . Obesity   . Tobacco abuse    Past Surgical History:  Procedure Laterality Date  . CARDIAC CATHETERIZATION  2009   [facility];   . CARDIAC CATHETERIZATION  2010  . CARDIAC CATHETERIZATION N/A 07/28/2016   Procedure: Right and Left Heart Cath and possible PCI;  Surgeon: Iran OuchMuhammad A Arida, MD;  Location: [facility] INVASIVE CV LAB;  Service: Cardiovascular;  Laterality: N/A;  . CARDIOVERSION N/A 03/27/2017   Procedure: CARDIOVERSION;  Surgeon: Iran OuchArida, Muhammad A, MD;  Location: [facility] ORS;  Service: Cardiovascular;  Laterality: N/A;  . CORONARY ANGIOPLASTY  2009   [facility]    Medications: Prior to Admission medications   Medication Sig Start Date End Date Taking? Authorizing Provider  albuterol-ipratropium (COMBIVENT) 18-103 MCG/ACT inhaler Inhale 2 puffs into the lungs every 4 (four) hours as needed for wheezing.    [provider]  atorvastatin (LIPITOR) 40 MG tablet Take 40 mg by mouth daily.     [provider]  budesonide-formoterol (SYMBICORT) 160-4.5 MCG/ACT  inhaler Inhale 2 puffs into the lungs 2 (two) times daily.    [provider]  carvedilol (COREG) 25 MG tablet Take 1 tablet (25 mg total) by mouth 2 (two) times daily with a meal. 07/31/16   Enedina Finner, MD  docusate sodium (COLACE) 100 MG capsule Take 100 mg by mouth 2 (two) times daily.    [provider]  metFORMIN (GLUCOPHAGE) 1000 MG tablet Take 1,000 mg by mouth 2 (two) times daily with a  meal.     [provider]  methocarbamol (ROBAXIN) 500 MG tablet Take 1 tablet (500 mg total) by mouth 4 (four) times daily. 12/17/17   Tommi Rumps, PA-C  nitroGLYCERIN (NITROSTAT) 0.4 MG SL tablet Place 0.4 mg under the tongue every 5 (five) minutes as needed for chest pain.    [provider]  potassium chloride SA (K-DUR,KLOR-CON) 20 MEQ tablet Take 1 tablet (20 mEq total) by mouth daily. 01/22/18   Shaune Pollack, MD  rivaroxaban (XARELTO) 20 MG TABS tablet Take 1 tablet (20 mg total) by mouth daily with supper. 02/19/17   Adrian Saran, MD  spironolactone (ALDACTONE) 25 MG tablet Take 1 tablet (25 mg total) by mouth daily. 08/01/16   Enedina Finner, MD  torsemide (DEMADEX) 20 MG tablet Take 2 tablets (40 mg total) by mouth 2 (two) times daily. 01/21/18   Shaune Pollack, MD  traZODone (DESYREL) 50 MG tablet Take 50 mg by mouth at bedtime.    [provider]    Allergies:   Allergies  Allergen Reactions  . Aspirin Other (See Comments)    Told not to take because of blood thinner  . Ibuprofen Nausea And Vomiting  . Tylenol [Acetaminophen] Nausea And Vomiting    Social History:  reports that he has quit smoking. His smoking use included cigarettes. He has a 35.00 pack-year smoking history. He has never used smokeless tobacco. He reports previous alcohol use. He reports that he does not use drugs.  Family History: Family History  Problem Relation Age of Onset  . Heart attack Mother 76    Physical Exam: Vitals:   07/25/18 1615 07/25/18 1630 07/25/18 1700 07/25/18 1715  BP: 117/73 (!) 113/93  (!) 111/93  Pulse: (!) 34 85    Resp: 13 16  (!) 9  Temp:   (!) 95.6 F (35.3 C)   TempSrc:   Rectal   SpO2: 95% 95%    Weight:      Height:        General:  Alert and oriented times three, well developed and nourished, no acute distress Eyes: PERRLA, pink conjunctiva, scleral icterus ENT: Moist oral mucosa, neck supple, no thyromegaly Lungs: clear to ascultation, no  wheeze, no crackles, no use of accessory muscles Cardiovascular: irregular rate and rhythm, no regurgitation, no gallops, no murmurs. No carotid bruits, no JVD, reproducible chest wall tenderness B/L Abdomen: soft, positive BS, mild generalized TTP, non-distended, no organomegaly, not an acute abdomen GU: not examined Neuro: CN II - XII grossly intact, sensation intact Musculoskeletal: strength 5/5 all extremities, no clubbing, cyanosis or edema Skin: no rash, no subcutaneous crepitation, no decubitus Psych: appropriate patient   Labs on Admission:  Recent Labs    07/25/18 1559  NA 133*  K 4.3  CL 94*  CO2 23  GLUCOSE 123*  BUN 59*  CREATININE 2.43*  CALCIUM 9.3   Recent Labs    07/25/18 1559  AST 48*  ALT 37  ALKPHOS 131*  BILITOT 6.5*  PROT 7.1  ALBUMIN 3.4*   No results for input(s): LIPASE, AMYLASE in the last 72 hours. Recent Labs    07/25/18 1559  WBC 18.3*  NEUTROABS 15.3*  HGB 17.2*  HCT 53.8*  MCV 92.0  PLT 197   Results for James, Harrington (MRN [id number]) as of 07/25/2018 17:49  Ref. Range 07/25/2018 15:59  Lactic Acid, Venous Latest Ref Range: 0.5 - 1.9 mmol/L 3.2 (HH)     No results for input(s): CKTOTAL, CKMB, CKMBINDEX, TROPONINI in the last 72 hours. Invalid input(s): POCBNP No results for input(s): DDIMER in the last 72 hours. No results for input(s): HGBA1C in the last 72 hours. No results for input(s): CHOL, HDL, LDLCALC, TRIG, CHOLHDL, LDLDIRECT in the last 72 hours. No results for input(s): TSH, T4TOTAL, T3FREE, THYROIDAB in the last 72 hours.  Invalid input(s): FREET3 No results for input(s): VITAMINB12, FOLATE, FERRITIN, TIBC, IRON, RETICCTPCT in the last 72 hours.  Micro Results: No results found for this or any previous visit (from the past 240 hour(s)).   Radiological Exams on Admission: Ct Abdomen Pelvis Wo Contrast  Result Date: 07/25/2018 CLINICAL DATA:  Abdomen and pelvic pain. Nausea and vomiting for 7 days. Constipation.  Elevated total bilirubin. EXAM: CT ABDOMEN AND PELVIS WITHOUT CONTRAST TECHNIQUE: Multidetector CT imaging of the abdomen and pelvis was performed following the standard protocol without IV contrast. COMPARISON:  Renal ultrasound dated 01/20/2018. Abdomen and pelvis CT dated 01/15/2012. FINDINGS: Lower chest: Enlarged heart. Coronary artery calcifications and stents. Hepatobiliary: Paddock artery atheromatous calcifications in the liver. Prominent caudate lobe. Normal appearing gallbladder. Pancreas: Unremarkable. No pancreatic ductal dilatation or surrounding inflammatory changes. Spleen: Normal in size without focal abnormality. Adrenals/Urinary Tract: The left kidney remains larger than the right kidney. Interval bilateral perinephric soft tissue stranding. Arterial calcifications with no definite renal calculi. Normal appearing adrenal glands, ureters and urinary bladder. Stomach/Bowel: Unremarkable stomach, small bowel and colon. No evidence of appendicitis. Vascular/Lymphatic: Atheromatous arterial calcifications without aneurysm. No enlarged lymph nodes. Reproductive: Normal sized prostate gland containing coarse calcifications. Other: Small bilateral inguinal hernias containing fat. Moderate-sized right paraumbilical hernia containing fat. Musculoskeletal: Lumbar and lower thoracic spine degenerative changes. IMPRESSION: 1. Interval bilateral perinephric soft tissue stranding, possibly due to acute pyelonephritis. 2. Moderate-sized right paraumbilical hernia containing fat with an interval increase in size. Aortic Atherosclerosis (ICD10-I70.0). Electronically Signed   By: Beckie Salts M.D.   On: 07/25/2018 17:33   Dg Chest 1 View  Result Date: 07/25/2018 CLINICAL DATA:  Cough. Ex-smoker. EXAM: CHEST  1 VIEW COMPARISON:  03/04/2018. FINDINGS: Progressive enlargement of the cardiac silhouette. The aorta remains mildly tortuous and partially calcified. Clear lungs with normal vascularity, improved. No  pleural fluid. Unremarkable bones. IMPRESSION: Progressive cardiomegaly. Otherwise, unremarkable examination. Electronically Signed   By: Beckie Salts M.D.   On: 07/25/2018 16:53    2D echo  06/14/2018 Result status: Final result                   [facility]*                       [address]                        [place], [place] [address]                            [phone]  ------------------------------------------------------------------- Transthoracic Echocardiography  Patient:    Jonell Harrington, James MR #:       [id number] Study Date: 06/14/2018 Gender:     M Age:        [age] Height:     180.3 cm Weight:     98.2 kg BSA:        2.24 m^2 Pt. Status: Room:   ATTENDING    Kevan Prouty  ORDERING     Gomer France  REFERRING    Aqib Lough  PERFORMING   [facility] [employer]  SONOGRAPHER  Humphrey RollsJoan Heiss, RDCS  cc:  ------------------------------------------------------------------- LV EF: 30% -   35%  ------------------------------------------------------------------- Indications:      TIA.  ------------------------------------------------------------------- History:   PMH:  HLD.  Atrial fibrillation.  Coronary artery disease.  Chronic obstructive pulmonary disease.  Risk factors: Hypertension. Diabetes mellitus.  ------------------------------------------------------------------- Study Conclusions  - Left ventricle: The cavity size was mildly dilated. There was   mild concentric hypertrophy. Systolic function was moderately to   severely reduced. The estimated ejection fraction was in the   range of 30% to 35%. Diffuse hypokinesis. - Mitral valve: There was mild regurgitation. - Left atrium: The atrium was mildly dilated. - Right atrium: The atrium was mildly dilated. - Pulmonary arteries: Systolic pressure could not be accurately   estimated. - Inferior vena cava: The vessel was dilated. The respirophasic   diameter changes were  blunted (< 50%), consistent with elevated   central venous pressure.  -------------------------------------------------------------------     Assessment/Plan Present on Admission: . Sepsis secondary to UTI (HCC)/hypothermia/ Lactic acidosis -Admit to stepdown -Blood and urine cultures ordered and collected.  No urine collected as yet this patient has had no urine output -Suspect sepsis due to UTI based on the stranding seen in bilateral kidneys on the CT abdomen -IV antibiotics with Zosyn and vancomycin ordered.  Single dose of vancomycin given. -Patient is received 3 L normal saline bolus.  Continue gentle IV fluid hydration.  Careful for fluid overload.  Patient's EF is 30 to 35%. -Repeat lactic acid level after 4 hours at 8 AM.  Following to normalize -heating blanket  . Nausea and vomiting . AKI (acute kidney injury) (HCC)/dehydration -Likely due to severe dehydration.  Gentle IV fluid hydration.  Tricked I's and O's.  BMP in a.m. -hold kdur, metformin, aldactone and demadex -Antiemetics PRN  . Hyperbilirubinemia/transaminitis -May be due to sepsis -hep panel ordered  . Atrial fibrillation with RVR (HCC) -Due to sepsis and severe dehydration.  Continue Cardizem drip -continue coreg with hold parameters and xarelto  Diabetes -hold metformin, SSI  . CAD (coronary artery disease)/chronic systolic heart failure -Patient complains of chest pain suspect that this is more pleuritic in nature.  Will cycle cardiac enzymes  . COPD (chronic obstructive pulmonary disease) (HCC) -Stable, home meds resumed.  DuoNeb  . HLD (hyperlipidemia) -hold Simvastation  Obstructive sleep apnea -Every hour sleep CPAP ordered  Aissa Lisowski 07/25/2018, 5:53 PM

## 2018-07-25 NOTE — Progress Notes (Signed)
CODE SEPSIS - PHARMACY COMMUNICATION  **Broad Spectrum Antibiotics should be administered within 1 hour of Sepsis diagnosis**  Time Code Sepsis Called/Page Received:  1/26 @ 1742  Antibiotics Ordered: Zosyn 3.375 gm   Time of 1st antibiotic administration:  1/26 @ 1712   Additional action taken by pharmacy:   If necessary, Name of Provider/Nurse Contacted:     Mehki Klumpp D ,PharmD Clinical Pharmacist  07/25/2018  5:47 PM

## 2018-07-25 NOTE — Progress Notes (Signed)
HR > 160, NP at bedside.

## 2018-07-25 NOTE — Progress Notes (Signed)
eLink Physician-Brief Progress Note Patient Name: [name] DOB: 11-25-1955 MRN: [id number]   Date of Service  07/25/2018  HPI/Events of Note  62 y/o male with a h/o afib on xarelto admitted with afib with RVR, urosepsis and acute pyelonephritis. On dilatiazem, amio, vanco and zosyn. Severe cardiomyopathy with LVEF 30-35% on last echo. VSS. No new orders.   eICU Interventions  No new orders.      Intervention Category Evaluation Type: New Patient Evaluation  Lenell Antu 07/25/2018, 9:57 PM

## 2018-07-26 ENCOUNTER — Inpatient Hospital Stay: Payer: Medicare Other

## 2018-07-26 DIAGNOSIS — I2510 Atherosclerotic heart disease of native coronary artery without angina pectoris: Secondary | ICD-10-CM

## 2018-07-26 DIAGNOSIS — I4891 Unspecified atrial fibrillation: Secondary | ICD-10-CM

## 2018-07-26 DIAGNOSIS — I5020 Unspecified systolic (congestive) heart failure: Secondary | ICD-10-CM | POA: Diagnosis present

## 2018-07-26 DIAGNOSIS — I5042 Chronic combined systolic (congestive) and diastolic (congestive) heart failure: Secondary | ICD-10-CM

## 2018-07-26 LAB — CBC
HCT: 46.4 % (ref 39.0–52.0)
Hemoglobin: 14.8 g/dL (ref 13.0–17.0)
MCH: 29.5 pg (ref 26.0–34.0)
MCHC: 31.9 g/dL (ref 30.0–36.0)
MCV: 92.4 fL (ref 80.0–100.0)
Platelets: 166 10*3/uL (ref 150–400)
RBC: 5.02 MIL/uL (ref 4.22–5.81)
RDW: 17.5 % — ABNORMAL HIGH (ref 11.5–15.5)
WBC: 13.8 10*3/uL — ABNORMAL HIGH (ref 4.0–10.5)
nRBC: 0.4 % — ABNORMAL HIGH (ref 0.0–0.2)

## 2018-07-26 LAB — GLUCOSE, CAPILLARY
GLUCOSE-CAPILLARY: 141 mg/dL — AB (ref 70–99)
Glucose-Capillary: 184 mg/dL — ABNORMAL HIGH (ref 70–99)
Glucose-Capillary: 209 mg/dL — ABNORMAL HIGH (ref 70–99)
Glucose-Capillary: 159 mg/dL — ABNORMAL HIGH (ref 70–99)

## 2018-07-26 LAB — COMPREHENSIVE METABOLIC PANEL
ALT: 31 U/L (ref 0–44)
AST: 39 U/L (ref 15–41)
Albumin: 2.8 g/dL — ABNORMAL LOW (ref 3.5–5.0)
Alkaline Phosphatase: 102 U/L (ref 38–126)
Anion gap: 14 (ref 5–15)
BUN: 62 mg/dL — AB (ref 8–23)
CHLORIDE: 101 mmol/L (ref 98–111)
CO2: 21 mmol/L — ABNORMAL LOW (ref 22–32)
CREATININE: 2.3 mg/dL — AB (ref 0.61–1.24)
Calcium: 8.4 mg/dL — ABNORMAL LOW (ref 8.9–10.3)
GFR calc Af Amer: 34 mL/min — ABNORMAL LOW (ref >60)
GFR calc non Af Amer: 30 mL/min — ABNORMAL LOW (ref >60)
Glucose, Bld: 168 mg/dL — ABNORMAL HIGH (ref 70–99)
POTASSIUM: 4.3 mmol/L (ref 3.5–5.1)
Sodium: 136 mmol/L (ref 135–145)
Total Bilirubin: 4.7 mg/dL — ABNORMAL HIGH (ref 0.3–1.2)
Total Protein: 6 g/dL — ABNORMAL LOW (ref 6.5–8.1)

## 2018-07-26 LAB — PROTIME-INR
INR: 2
Prothrombin Time: 22.4 seconds — ABNORMAL HIGH (ref 11.4–15.2)

## 2018-07-26 LAB — TROPONIN I
Troponin I: 0.43 ng/mL (ref <0.03)
Troponin I: 0.37 ng/mL (ref <0.03)

## 2018-07-26 LAB — PROCALCITONIN: Procalcitonin: 7.8 ng/mL

## 2018-07-26 MED ORDER — BUDESONIDE 0.5 MG/2ML IN SUSP
0.5000 mg | Freq: Two times a day (BID) | RESPIRATORY_TRACT | Status: DC
  Administered 2018-07-26 – 2018-07-31 (×11): 0.5 mg via RESPIRATORY_TRACT
  Filled 2018-07-26 (×11): qty 2

## 2018-07-26 MED ORDER — NOREPINEPHRINE-SODIUM CHLORIDE 4-0.9 MG/250ML-% IV SOLN
INTRAVENOUS | Status: AC
  Filled 2018-07-26: qty 250

## 2018-07-26 MED ORDER — DIGOXIN 0.25 MG/ML IJ SOLN
0.2500 mg | Freq: Once | INTRAMUSCULAR | Status: AC
  Administered 2018-07-26: 0.25 mg via INTRAVENOUS
  Filled 2018-07-26: qty 2

## 2018-07-26 MED ORDER — SODIUM CHLORIDE 0.9 % IV SOLN
INTRAVENOUS | Status: DC | PRN
  Administered 2018-07-26: 250 mL via INTRAVENOUS

## 2018-07-26 MED ORDER — SODIUM CHLORIDE 0.9 % IV BOLUS
1000.0000 mL | Freq: Once | INTRAVENOUS | Status: AC
  Administered 2018-07-26: 1000 mL via INTRAVENOUS

## 2018-07-26 MED ORDER — IPRATROPIUM-ALBUTEROL 0.5-2.5 (3) MG/3ML IN SOLN
3.0000 mL | RESPIRATORY_TRACT | Status: DC
  Administered 2018-07-26 – 2018-07-28 (×14): 3 mL via RESPIRATORY_TRACT
  Filled 2018-07-26 (×15): qty 3

## 2018-07-26 MED ORDER — FAMOTIDINE IN NACL 20-0.9 MG/50ML-% IV SOLN
20.0000 mg | INTRAVENOUS | Status: DC
  Administered 2018-07-26 – 2018-07-29 (×4): 20 mg via INTRAVENOUS
  Filled 2018-07-26 (×4): qty 50

## 2018-07-26 NOTE — Progress Notes (Signed)
Upon entering room pt noted to have removed nasal cannula, Sat 87% RR 0. Pt RR & Sat  increased upon waking. Pt stated he does not want to wear oxygen right now.

## 2018-07-26 NOTE — Progress Notes (Signed)
I called to discuss findings with wife  Patient has stated that he would NOT want ventilator and this has been confirmed by his Wife Belinda  Patient is Limited Code  He refused BiPAP last night He does NOT not like Reyno   If resp status declines, we will NOT intubate.  Patient is DNI.    Lucie Leather, M.D.  [facility]  Medical Director [facility] Medical Director [facility] Cardio-Pulmonary Department

## 2018-07-26 NOTE — Progress Notes (Signed)
[employer] [facility] [place] [facility]   PATIENT NAME: James CluckSamuel Harrington    MR#:  [id number]  DATE OF BIRTH:  [birth year]  SUBJECTIVE:  CHIEF COMPLAINT:   Chief Complaint  Patient presents with  . Nausea  . Emesis  c/o chest pain. Looks sick REVIEW OF SYSTEMS:  Review of Systems  Constitutional: Positive for malaise/fatigue. Negative for diaphoresis, fever and weight loss.  HENT: Negative for ear discharge, ear pain, hearing loss, nosebleeds, sore throat and tinnitus.   Eyes: Negative for blurred vision and pain.  Respiratory: Negative for cough, hemoptysis, shortness of breath and wheezing.   Cardiovascular: Positive for chest pain. Negative for palpitations, orthopnea and leg swelling.  Gastrointestinal: Negative for abdominal pain, blood in stool, constipation, diarrhea, heartburn, nausea and vomiting.  Genitourinary: Negative for dysuria, frequency and urgency.  Musculoskeletal: Negative for back pain and myalgias.  Skin: Negative for itching and rash.  Neurological: Negative for dizziness, tingling, tremors, focal weakness, seizures, weakness and headaches.  Psychiatric/Behavioral: Negative for depression. The patient is not nervous/anxious.     DRUG ALLERGIES:   Allergies  Allergen Reactions  . Aspirin Other (See Comments)    Told not to take because of blood thinner  . Ibuprofen Nausea And Vomiting  . Tylenol [Acetaminophen] Nausea And Vomiting   VITALS:  Blood pressure 96/73, pulse (!) 108, temperature (!) 97.4 F (36.3 C), temperature source Oral, resp. rate (!) 21, height 6' (1.829 m), weight 100.7 kg, SpO2 100 %. PHYSICAL EXAMINATION:  Physical Exam Constitutional:      General: He is in acute distress.     Appearance: He is ill-appearing and toxic-appearing.  HENT:     Head: Normocephalic and atraumatic.  Eyes:     Conjunctiva/sclera: Conjunctivae normal.     Pupils: Pupils are equal, round, and reactive to light.  Neck:     Musculoskeletal:  Normal range of motion and neck supple.     Thyroid: No thyromegaly.     Trachea: No tracheal deviation.  Cardiovascular:     Rate and Rhythm: Tachycardia present. Rhythm irregularly irregular.     Heart sounds: Normal heart sounds.  Pulmonary:     Effort: Pulmonary effort is normal. No respiratory distress.     Breath sounds: Normal breath sounds. No wheezing.  Chest:     Chest wall: No tenderness.  Abdominal:     General: Bowel sounds are normal. There is no distension.     Palpations: Abdomen is soft.     Tenderness: There is no abdominal tenderness.  Musculoskeletal: Normal range of motion.  Skin:    General: Skin is warm and dry.     Findings: No rash.  Neurological:     Mental Status: He is alert and oriented to person, place, and time.     Cranial Nerves: No cranial nerve deficit.    LABORATORY PANEL:  Male CBC Recent Labs  Lab 07/26/18 0216  WBC 13.8*  HGB 14.8  HCT 46.4  PLT 166   ------------------------------------------------------------------------------------------------------------------ Chemistries  Recent Labs  Lab 07/25/18 2033 07/26/18 0216  NA 135 136  K 4.1 4.3  CL 104 101  CO2 15* 21*  GLUCOSE 120* 168*  BUN 60* 62*  CREATININE 2.31* 2.30*  CALCIUM 8.3* 8.4*  MG 2.5*  --   AST  --  39  ALT  --  31  ALKPHOS  --  102  BILITOT  --  4.7*   RADIOLOGY:  Dg Chest Port 1 View  Result  Date: 07/26/2018 CLINICAL DATA:  Arrhythmia. EXAM: PORTABLE CHEST 1 VIEW COMPARISON:  07/25/2018 FINDINGS: Single view of the chest demonstrates stable enlargement of the cardiac silhouette. Atherosclerotic calcifications at the aortic arch. No focal airspace disease or overt pulmonary edema. No acute bone abnormality. Negative for a pneumothorax. IMPRESSION: Stable cardiomegaly.  No focal lung disease. Electronically Signed   By: Richarda Overlie M.D.   On: 07/26/2018 07:47   ASSESSMENT AND PLAN:  . Sepsis secondary to UTI (HCC)/hypothermia/ Lactic acidosis -Blood  and urine cultures pending. -Suspect sepsis due to UTI based on the stranding seen in bilateral kidneys on the CT abdomen -IV Rocephin for now -Continue gentle IV fluid hydration.  Careful for fluid overload.  Patient's EF is 30 to 35%.  . Nausea and vomiting: resolved  . AKI (acute kidney injury) (HCC)/dehydration: can't rule out underlying CKD -Likely due to severe dehydration.  Gentle IV fluid hydration.  Tricked I's and O's.  BMP in a.m. -hold kdur, metformin, aldactone and demadex -Antiemetics PRN  . Hyperbilirubinemia/transaminitis -May be due to sepsis -hep panel pending  . Atrial fibrillation with RVR (HCC) -Due to sepsis and severe dehydration.  Continue Amio drip -continue coreg with hold parameters and xarelto - Cardio to consider cardioversion once acute illness improves  Diabetes -hold metformin, SSI  . CAD (coronary artery disease)/chronic systolic heart failure -Patient complains of chest pain suspect that this is more pleuritic in nature. Troponin peak to 0.4 likley due to demand ischemia   . COPD (chronic obstructive pulmonary disease) (HCC) -Stable, home meds resumed.  DuoNeb  . HLD (hyperlipidemia) -hold Simvastation  Obstructive sleep apnea -Every hour sleep CPAP ordered   Overall poor prognosis  All the records are reviewed and case discussed with Care Management/Social Worker. Management plans discussed with the patient, Intensivist and they are in agreement.  CODE STATUS: Partial Code  TOTAL TIME TAKING CARE OF THIS PATIENT: 15 minutes.   More than 50% of the time was spent in counseling/coordination of care: YES  POSSIBLE D/C IN 2-3 DAYS, DEPENDING ON CLINICAL CONDITION.   Delfino Lovett M.D on 07/26/2018 at 5:05 PM  Between 7am to 6pm - Pager - [phone]  After 6pm go to [email or web address] - Scientist, research (life sciences) [facility]  Office  [phone]  CC: Primary care physician; Administration, [facility]  Note:  This dictation was prepared with Nurse, children's dictation along with smaller Lobbyist. Any transcriptional errors that result from this process are unintentional.

## 2018-07-26 NOTE — Progress Notes (Signed)
A&O patient has not voided since admission to bed 16. Bladder scan shows , pt denies the urge or need to void. NP notified.

## 2018-07-26 NOTE — Progress Notes (Signed)
Lab notified of urine culture order and request made to add on to current urine from previous u/a

## 2018-07-26 NOTE — Consult Note (Signed)
Cardiology Consult    Patient ID: [name] MRN: [id number], DOB/AGE: 10/06/1955   Admit date: 07/25/2018 Date of Consult: 07/26/2018  Primary Physician: [facility] Primary [facility] Requesting Provider: Seth BakeV. Sherryll BurgerShah, MD  Patient Profile    [name] is a 63 y.o. male with a history of CAD s/p prior stenting to the RCA and RPDA, HFrEF w/ EF of 30-35% by echo 05/2018, HTN, HL, DMII, persistent afib/flutter, and carotid arterial dzs s/p recent stroke in 05/2018 - pending R CEA, who is being seen today for the evaluation of Afib rvr and troponin elevation at the request of Dr. Sherryll BurgerShah.  Past Medical History   Past Medical History:  Diagnosis Date  . CAD in native artery    a. stress echo 12/2007 abnl, EF > 55%, b. LHC 01/28/08: mLAD 30, D1 40, dLCx 70, pRCA 30, mRCA 70, mRCA lesion 2 80, PDA 90, s/p PCI/BMS to prox and distal RCA, s/p PCI/BMS to PDA; c. patient reports PCI/st[facility] x 2 in early 2017 [facility] (no records on file) d. 06/2016: cath showing patent stents along RCA and LCx with moderate 40% stenosis along the LAD.   [name]. Carotid arterial disease (HCC)    a. 05/2018 CTA Head/neck: 80-90% stenosis @ L carotid bifurcation, extending into the prox LICA. 60% stenosis @ R carotid bifurcation. 50-75% bilateral distal cavernous carotid dzs.  . Chronic combined systolic (congestive) and diastolic (congestive) heart failure (HCC)    a. 2009 Echo: > 55%; b. 06/2016: EF 25-30%; c. 12/2017 Echo: EF 30-35%, diff HK; d. 05/2018 Echo: 30-35%, mild conc LVH, diff HK. Mild MR. Mildly dil LA/RA.  [name]. COPD (chronic obstructive pulmonary disease) (HCC)   . Diabetes mellitus without complication (HCC)   . Gout   . Hyperlipidemia   . Hypertensive heart disease   . Ischemic cardiomyopathy    a. 05/2018: 30-35%.  . Obesity   . Persistent atrial fibrillation 01/2017   a. cardioverted 9/18 to NSR; b. 12/2017 noted to be back in Afib-outpt dccv rec; c. CHA2DS2VASc = 6-->Xarelto.    . Stroke (cerebrum) (HCC)    a. 05/2018 MRI: small patchy acute to subacute cortial and white matter infarct in the bilat cerebrum. Mild chronic small vessel ischmia; b. 05/2018 Carotid U/S: L-carotid bifurcation 80-90% stenosed, R- carotid bifurcation 60% stenosed.  . Tobacco abuse     Past Surgical History:  Procedure Laterality Date  . CARDIAC CATHETERIZATION  2009   [facility];   . CARDIAC CATHETERIZATION  2010  . CARDIAC CATHETERIZATION N/A 07/28/2016   Procedure: Right and Left Heart Cath and possible PCI;  Surgeon: Iran OuchMuhammad A Arida, MD;  Location: [facility] INVASIVE CV LAB;  Service: Cardiovascular;  Laterality: N/A;  . CARDIOVERSION N/A 03/27/2017   Procedure: CARDIOVERSION;  Surgeon: Iran OuchArida, Muhammad A, MD;  Location: [facility] ORS;  Service: Cardiovascular;  Laterality: N/A;  . CORONARY ANGIOPLASTY  2009   [facility]     Allergies  Allergies  Allergen Reactions  . Aspirin Other (See Comments)    Told not to take because of blood thinner  . Ibuprofen Nausea And Vomiting  . Tylenol [Acetaminophen] Nausea And Vomiting    History of Present Illness    63 y/o ? with the above complex PMH including CAD s/p prior stenting to the RCA and RPDA, HFrEF w/ EF of 30-35% by echo 05/2018, HTN, HL, DMII, persistent afib/flutter, and carotid arterial dzs s/p recent stroke in 05/2018 - pending R CEA.  He  has been followed by cardiology @ the [facility]  His cardiac history dates back to at least 2008 with abnl stress test @ that time, followed by cath and PCI of the dRCA and RPDA in 2009.  He reportedly had repeat stenting in 2017 and was hospitalized in 06/2016 with CHF and new finding of LV dysfxn (EF 25-30%).  Cath @ that time showed patent RCA/RPDA and LCX stents and he was medically managed.  In 02/2017, he was admitted with Afib/flutter w/ RVR and was successfully cardioverted.  More recently, he was admitted in 12/2017 with recurrent afib/flutter and CHF.  He was rate controlled and  maintained on xarelto w/ a plan for outpt dccv.  Pt f/u with [facility] and isn't sure if he was ever cardioverted.  He was readmitted in 05/2018 with slurred speech and facial droop.  MRI showed bilateral cerebral stroke and CTA of head/neck showed severe R carotid dzs.  He was seen by vascular surgery w/ plan for outpt CEA, however this has been delayed in the setting of seeking cardiology clearance from his outpt cardiologist.  He was in his usoh until ~ 2 wks ago, when he began to note generalized malaise with lightheadedness followed by increasing abd and lower ext swelling, dyspnea, chills, palpitations, anorexia, nausea, and vomiting.  He also developed a cough ~ 1 wk ago, which was productive of thick white sputum.  In the setting of coughing, he has had some pleuritic c/p.  Due to progressive symptoms, he presented to the [facility] ED on 1/26, where he was found to be in Afib w/ RVR (180), with acute renal failure (creat 2.43), leukocytosis (18.3), lactic acidosis (lactate 3.2; PCT 8.4), & w/ elevated trop (0.20).  CXR non-acute.  CT Abd/pelvis suggestive of pyelonephritis.  For rapid AFib, he was treated w/ IV amio and IV dilt, as well as loaded w/ digoxin with improvement in rate.  He was admitted to ICU and placed on IV abx.  BP's have been soft and IV dilt has been discontinued.  He continues to complain of malaise, dyspnea, and pleuritic c/p. We've been asked to eval r/t afib and elevated trop.  Inpatient Medications    . budesonide (PULMICORT) nebulizer solution  0.5 mg Nebulization BID  . carvedilol  25 mg Oral BID WC  . docusate sodium  100 mg Oral BID  . insulin aspart  0-15 Units Subcutaneous TID WC  . insulin aspart  0-5 Units Subcutaneous QHS  . ipratropium-albuterol  3 mL Nebulization Q4H  . mometasone-formoterol  2 puff Inhalation BID  . norepinephrine      . Rivaroxaban  15 mg Oral Q supper  . senna  1 tablet Oral BID  . traZODone  50 mg Oral QHS    Family History    Family  History  Problem Relation Age of Onset  . Heart attack Mother 32  . Hypertension Mother   . Cancer Brother    He indicated that his mother is deceased. He indicated that his father is alive. He indicated that his brother is deceased.   Social History    Social History   Socioeconomic History  . Marital status: Married    Spouse name: Not on file  . Number of children: Not on file  . Years of education: Not on file  . Highest education level: Not on file  Occupational History  . Not on file  Social Needs  . Financial resource strain: Not on file  . Food  insecurity:    Worry: Not on file    Inability: Not on file  . Transportation needs:    Medical: Not on file    Non-medical: Not on file  Tobacco Use  . Smoking status: Former Smoker    Packs/day: 1.00    Years: 35.00    Pack years: 35.00    Types: Cigarettes  . Smokeless tobacco: Never Used  Substance and Sexual Activity  . Alcohol use: Not Currently    Comment: Quit 8 yrs.  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 Drug use: No  . Sexual activity: Not on file  Lifestyle  . Physical activity:    Days per week: Not on file    Minutes per session: Not on file  . Stress: Not on file  Relationships  . Social connections:    Talks on phone: Not on file    Gets together: Not on file    Attends religious service: Not on file    Active member of club or organization: Not on file    Attends meetings of clubs or organizations: Not on file    Relationship status: Not on file  . Intimate partner violence:    Fear of current or ex partner: Not on file    Emotionally abused: Not on file    Physically abused: Not on file    Forced sexual activity: Not on file  Other Topics Concern  . Not on file  Social History Narrative   Lives locally with wife.  Does not routinely exercise.     Review of Systems    General:  +++ chills, +++ malaise, no fever, night sweats or weight changes.  Cardiovascular:  +++ pleuritic chest pain, +++ dyspnea  on exertion, no edema, orthopnea, palpitations, paroxysmal nocturnal dyspnea. Dermatological: No rash, lesions/masses Respiratory: +++ cough - white sputum, +++ dyspnea Urologic: No hematuria, dysuria Abdominal:   +++ nausea, vomiting, no diarrhea, bright red blood per rectum, melena, or hematemesis Neurologic:  No visual changes, wkns, changes in mental status. All other systems reviewed and are otherwise negative except as noted above.  Physical Exam    Blood pressure 96/73, pulse 76, temperature (!) 97.4 F (36.3 C), temperature source Oral, resp. rate 10, height 6' (1.829 m), weight 100.7 kg, SpO2 100 %.  General: Pleasant, NAD Psych: Flat affect. Neuro: Alert and oriented X 3. Moves all extremities spontaneously. HEENT: Normal  Neck: Supple without bruits.  JVP difficult to gauge 2/2 body habitus. Lungs:  Resp regular and unlabored, diminished breath sounds bilat. Heart: IR, IR no s3, s4, or murmurs. Diffuse chest wall tenderness. Abdomen: Soft, diffusely tender, non-distended, BS + x 4.  Extremities: No clubbing, cyanosis or edema. DP/PT/Radials 1+ and equal bilaterally.  Labs    Recent Labs    07/25/18 2033 07/26/18 0216  TROPONINI 0.20* 0.43*   Lab Results  Component Value Date   WBC 13.8 (H) 07/26/2018   HGB 14.8 07/26/2018   HCT 46.4 07/26/2018   MCV 92.4 07/26/2018   PLT 166 07/26/2018    Recent Labs  Lab 07/26/18 0216  NA 136  K 4.3  CL 101  CO2 21*  BUN 62*  CREATININE 2.30*  CALCIUM 8.4*  PROT 6.0*  BILITOT 4.7*  ALKPHOS 102  ALT 31  AST 39  GLUCOSE 168*   Lab Results  Component Value Date   CHOL 139 06/14/2018   HDL 33 (L) 06/14/2018   LDLCALC 79 06/14/2018   TRIG 135 06/14/2018  Radiology Studies    Ct Abdomen Pelvis Wo Contrast  Result Date: 07/25/2018 CLINICAL DATA:  Abdomen and pelvic pain. Nausea and vomiting for 7 days. Constipation. Elevated total bilirubin. EXAM: CT ABDOMEN AND PELVIS WITHOUT CONTRAST TECHNIQUE:  Multidetector CT imaging of the abdomen and pelvis was performed following the standard protocol without IV contrast. COMPARISON:  Renal ultrasound dated 01/20/2018. Abdomen and pelvis CT dated 01/15/2012. FINDINGS: Lower chest: Enlarged heart. Coronary artery calcifications and stents. Hepatobiliary: Paddock artery atheromatous calcifications in the liver. Prominent caudate lobe. Normal appearing gallbladder. Pancreas: Unremarkable. No pancreatic ductal dilatation or surrounding inflammatory changes. Spleen: Normal in size without focal abnormality. Adrenals/Urinary Tract: The left kidney remains larger than the right kidney. Interval bilateral perinephric soft tissue stranding. Arterial calcifications with no definite renal calculi. Normal appearing adrenal glands, ureters and urinary bladder. Stomach/Bowel: Unremarkable stomach, small bowel and colon. No evidence of appendicitis. Vascular/Lymphatic: Atheromatous arterial calcifications without aneurysm. No enlarged lymph nodes. Reproductive: Normal sized prostate gland containing coarse calcifications. Other: Small bilateral inguinal hernias containing fat. Moderate-sized right paraumbilical hernia containing fat. Musculoskeletal: Lumbar and lower thoracic spine degenerative changes. IMPRESSION: 1. Interval bilateral perinephric soft tissue stranding, possibly due to acute pyelonephritis. 2. Moderate-sized right paraumbilical hernia containing fat with an interval increase in size. Aortic Atherosclerosis (ICD10-I70.0). Electronically Signed   By: Beckie Salts M.D.   On: 07/25/2018 17:33   Dg Chest 1 View  Result Date: 07/25/2018 CLINICAL DATA:  Cough. Ex-smoker. EXAM: CHEST  1 VIEW COMPARISON:  03/04/2018. FINDINGS: Progressive enlargement of the cardiac silhouette. The aorta remains mildly tortuous and partially calcified. Clear lungs with normal vascularity, improved. No pleural fluid. Unremarkable bones. IMPRESSION: Progressive cardiomegaly. Otherwise,  unremarkable examination. Electronically Signed   By: Beckie Salts M.D.   On: 07/25/2018 16:53   Dg Chest Port 1 View  Result Date: 07/26/2018 CLINICAL DATA:  Arrhythmia. EXAM: PORTABLE CHEST 1 VIEW COMPARISON:  07/25/2018 FINDINGS: Single view of the chest demonstrates stable enlargement of the cardiac silhouette. Atherosclerotic calcifications at the aortic arch. No focal airspace disease or overt pulmonary edema. No acute bone abnormality. Negative for a pneumothorax. IMPRESSION: Stable cardiomegaly.  No focal lung disease. Electronically Signed   By: Richarda Overlie M.D.   On: 07/26/2018 07:47    ECG & Cardiac Imaging    Afib, 156, prior inf/ant infarcts. No acute changes. - personally reviewed.  Assessment & Plan    1.  Sepsis/Pyelonephritis:  Pt presented with a 2 wk h/o progressive malaise, fatigue, chills, anorexia, n, and vomiting.  Found to have acute renal failure, lactic acidosis, leukocytosis, hypothermia, and afib w/ RVR.  Abx per IM.  BP's soft.  Watch volume status closely in the setting of IVF/Bicarb infusion.  2.  Afib RVR:  Rates elevated on admission in the setting of above.  H/o Afib dating back to at least 02/2017, at which time he was cardioverted.  He was hospitalized in 12/2017, in the ED in 02/2018, and admitted 05/2018 and was noted to be in rate-controlled afib on all of those occasions.  We had recommended repeat outpt DCCV in July, however [facility] and it sounds like he hasn't been seen there in some time, due to difficulty scheduling.  Rates better this AM on IV amio.  IV dilt d/c'd in setting of improved rates, soft bp's, and known cardiomyopathy.  In the setting of cardiomyopathy and propensity for tachycardia, I suspect that he will benefit from a rhythm control strategy and cardioversion prior to  discharge.  Ideally, would like to see him clinically improve first.  Cont IV amio for now and we will transition to oral amio later.  Cont xarelto.  May need to adjust  dose in setting of acute renal failure w/ dropping creat cl - follow for now.  Cont  blocker as bp tolerates.  3.  CAD/Elevated troponin:  Trop 0.20  0.43 in setting of sepsis and Afib RVR.  He has had pleuritic c/p, esp in the setting of cough. ECG non-acute.  Repeat trop pending this afternoon.  If trending up, would plan to follow until it turns down.  Last cath in 06/2016 showed patent RCA/RPDA and LCX stents and otw nonobstructive dzs.  He will likely require repeat ischemic evaluation, dependent upon troponin trend.  With ARF, he is not a good cath candidate at this time, thus stress testing (inpt vs outpt) might be more appropriate once he has fully recovered.  Cont  blocker as tolerated.  Resume home dose of statin.  No asa in setting of xarelto.  4.  Essential HTN/Hypotension:  Diuretics on hold.  BP soft.  Follow.  5.  HL: LDL 79 in Dec.  Resume statin.  6.  Chronic combined syst/diast CHF:  He has been having dyspnea in the setting of cough and congestion.  Wt is currently listed as up ~ 2.5kg from December admission.  CXR w/o acute dzs.  Follow volume status closely in the setting of sepsis and IVF.  Diuretics on hold in setting of sepsis/hypotension/acute renal failure.  Cont  blocker as bp allows.  7.  DMII:  Per IM.  8.  Carotid arterial dzs: s/p stroke in Dec w/ 80% right carotid stenosis @ the bifurcation.  Pending CEA w/ vascular surgery - pt awaiting cardiology clearance from [place].  With this admission and elevated troponin, he will need ischemic eval prior to surgery.  Poor cath candidate 2/2 acute renal failure and sepsis.  Can consider non-invasive testing either prior to d/c or as outpt.  Signed, Nicolasa Ducking, NP 07/26/2018, 2:12 PM  For questions or updates, please contact   Please consult [email or web address] for contact info under Cardiology/STEMI.

## 2018-07-27 ENCOUNTER — Telehealth (INDEPENDENT_AMBULATORY_CARE_PROVIDER_SITE_OTHER): Payer: Self-pay

## 2018-07-27 DIAGNOSIS — A419 Sepsis, unspecified organism: Principal | ICD-10-CM

## 2018-07-27 DIAGNOSIS — E119 Type 2 diabetes mellitus without complications: Secondary | ICD-10-CM

## 2018-07-27 DIAGNOSIS — R6520 Severe sepsis without septic shock: Secondary | ICD-10-CM

## 2018-07-27 DIAGNOSIS — J432 Centrilobular emphysema: Secondary | ICD-10-CM

## 2018-07-27 DIAGNOSIS — N179 Acute kidney failure, unspecified: Secondary | ICD-10-CM

## 2018-07-27 LAB — URINE CULTURE: Culture: NO GROWTH

## 2018-07-27 LAB — GLUCOSE, CAPILLARY
Glucose-Capillary: 116 mg/dL — ABNORMAL HIGH (ref 70–99)
Glucose-Capillary: 156 mg/dL — ABNORMAL HIGH (ref 70–99)
Glucose-Capillary: 174 mg/dL — ABNORMAL HIGH (ref 70–99)
Glucose-Capillary: 171 mg/dL — ABNORMAL HIGH (ref 70–99)

## 2018-07-27 LAB — HEPATITIS PANEL, ACUTE
HCV Ab: 7.8 s/co ratio — ABNORMAL HIGH (ref 0.0–0.9)
HEP A IGM: NEGATIVE
HEP B C IGM: NEGATIVE
Hepatitis B Surface Ag: NEGATIVE

## 2018-07-27 LAB — PROCALCITONIN: Procalcitonin: 6.37 ng/mL

## 2018-07-27 MED ORDER — ENSURE MAX PROTEIN PO LIQD
11.0000 [oz_av] | Freq: Every day | ORAL | Status: DC
  Administered 2018-07-27 – 2018-07-30 (×4): 11 [oz_av] via ORAL
  Filled 2018-07-27: qty 330

## 2018-07-27 NOTE — Evaluation (Signed)
Physical Therapy Evaluation Patient Details Name: [name] MRN: [id number] DOB: [birth year] Today's Date: 07/27/2018   History of Present Illness  Pt is a 63y/o male who presented with being ill almost 2 weeks. He couldn't hold anything on his stomach. He was not able to eat, everything came up. He was constipated.  He reported being light headed and dizzy for the last 2 weeks with difficulty walking. He reported SOB, coughing but no wheezing. He reported B/L chest pains especially with his cough.  In the ER the patient was hypothermic and tachycardic with heart rates in the 180's.  Assessment includes:  sepsis due to UTI, hypothermia, lactic acidosis, AKI, dehydration, transaminitis, hyperbilirubinemia, A-fib with RVR, DM, CAD, chronic CHF, COPD, and OSA.  PMH includes recent CVA in December 2019 with residual L-sided weakness per pt.    Clinical Impression  Pt presents with deficits in strength, transfers, mobility, gait, balance, and activity tolerance.  Pt required +2 min-mod A with bed mobility tasks and +2 min A for safety with sit to/from stand transfers.  Pt was able to take 2 very small steps at the EOB with min instability during amb with a RW but then fatigued and required to return to sitting.  Pt's SpO2 and HR were WNL during the session with no adverse symptoms noted other than fatigue.  Pt presents with a significant decline in functional mobility compared to his stated baseline and would not be safe to return to his prior living situation at this time.  Pt will benefit from PT services in a SNF setting upon discharge to safely address above deficits for decreased caregiver assistance and eventual return to PLOF.      Follow Up Recommendations SNF;Supervision for mobility/OOB    Equipment Recommendations  Other (comment);Rolling walker with 5" wheels(TBD at next venue of care upon discharge to a SNF)    Recommendations for Other Services       Precautions / Restrictions  Precautions Precautions: Fall Restrictions Weight Bearing Restrictions: No      Mobility  Bed Mobility Overal bed mobility: Needs Assistance Bed Mobility: Supine to Sit;Sit to Supine     Supine to sit: +2 for physical assistance;Min assist Sit to supine: +2 for physical assistance;Mod assist   General bed mobility comments: Mod verbal cues for sequencing  Transfers Overall transfer level: Needs assistance Equipment used: Rolling walker (2 wheeled) Transfers: Sit to/from Stand Sit to Stand: Min assist;+2 safety/equipment         General transfer comment: Min verbal cues for hand placement  Ambulation/Gait Ambulation/Gait assistance: Min assist Gait Distance (Feet): 1 Feet Assistive device: Rolling walker (2 wheeled) Gait Pattern/deviations: Step-to pattern Gait velocity: decreased   General Gait Details: Min instability during amb with pt fatiguing after only 2 very small steps at the EOB and requiring to return to sitting  Stairs            Wheelchair Mobility    Modified Rankin (Stroke Patients Only)       Balance Overall balance assessment: Mild deficits observed, not formally tested                                           Pertinent Vitals/Pain Pain Assessment: 0-10 Pain Score: 8  Pain Location: BLEs Pain Descriptors / Indicators: Sore Pain Intervention(s): Premedicated before session;Monitored during session;Repositioned    Home Living Family/patient expects to  be discharged to:: Private residence Living Arrangements: Spouse/significant other Available Help at Discharge: Available 24 hours/day;Family Type of Home: Other(Comment)(Handicap accessible) Home Access: Level entry     Home Layout: One level Home Equipment: Cane - single point;Grab bars - tub/shower;Shower seat;Other (comment);Grab bars - toilet(rollator)      Prior Function Level of Independence: Needs assistance   Gait / Transfers Assistance Needed: Mod  Ind amb with either a SPC or a rollator limited community distances, no falls in the last 6 months  ADL's / Homemaking Assistance Needed: Spouse assists with dressing, pt Ind with bathing        Hand Dominance   Dominant Hand: Right    Extremity/Trunk Assessment   Upper Extremity Assessment Upper Extremity Assessment: Generalized weakness    Lower Extremity Assessment Lower Extremity Assessment: Generalized weakness       Communication   Communication: No difficulties  Cognition Arousal/Alertness: Awake/alert Behavior During Therapy: Flat affect Overall Cognitive Status: Within Functional Limits for tasks assessed                                        General Comments      Exercises Total Joint Exercises Ankle Circles/Pumps: AROM;Both;10 reps Quad Sets: Strengthening;Both;10 reps Gluteal Sets: Strengthening;Both;10 reps Short Arc Quad: AROM;Both;10 reps Hip ABduction/ADduction: AAROM;Both;10 reps Straight Leg Raises: AAROM;Both;10 reps Long Arc Quad: AROM;Both;10 reps Knee Flexion: AROM;Both;10 reps Other Exercises Other Exercises: HEP education for BLE APs, QS, and GS x 10 each every 1-2 hrs daily   Assessment/Plan    PT Assessment Patient needs continued PT services  PT Problem List Decreased strength;Decreased activity tolerance;Decreased balance;Decreased mobility;Decreased knowledge of use of DME       PT Treatment Interventions DME instruction;Gait training;Functional mobility training;Therapeutic activities;Therapeutic exercise;Balance training;Patient/family education    PT Goals (Current goals can be found in the Care Plan section)  Acute Rehab PT Goals Patient Stated Goal: To walk and get stronger PT Goal Formulation: With patient Time For Goal Achievement: 08/09/18 Potential to Achieve Goals: Good    Frequency Min 2X/week   Barriers to discharge Inaccessible home environment      Co-evaluation                AM-PAC PT "6 Clicks" Mobility  Outcome Measure Help needed turning from your back to your side while in a flat bed without using bedrails?: A Little Help needed moving from lying on your back to sitting on the side of a flat bed without using bedrails?: A Lot Help needed moving to and from a bed to a chair (including a wheelchair)?: A Lot Help needed standing up from a chair using your arms (e.g., wheelchair or bedside chair)?: A Little Help needed to walk in hospital room?: Total Help needed climbing 3-5 steps with a railing? : Total 6 Click Score: 12    End of Session Equipment Utilized During Treatment: Gait belt;Oxygen Activity Tolerance: Patient limited by fatigue Patient left: in bed;with bed alarm set;with call bell/phone within reach;with nursing/sitter in room Nurse Communication: Mobility status PT Visit Diagnosis: Unsteadiness on feet (R26.81);Muscle weakness (generalized) (M62.81);Difficulty in walking, not elsewhere classified (R26.2)    Time: 1405-1440 PT Time Calculation (min) (ACUTE ONLY): 35 min   Charges:   PT Evaluation $PT Eval Moderate Complexity: 1 Mod PT Treatments $Therapeutic Exercise: 8-22 mins        D. Elly ModenaScott Leman Martinek PT, DPT 07/27/18,  3:02 PM

## 2018-07-27 NOTE — Progress Notes (Signed)
Initial Nutrition Assessment  DOCUMENTATION CODES:   Not applicable  INTERVENTION:  Provide Ensure Max protein supplement once daily, each supplement provides 150kcal and 30g of protein. Patient prefers chocolate.  NUTRITION DIAGNOSIS:   Inadequate oral intake related to decreased appetite, nausea, vomiting as evidenced by per patient/family report.  GOAL:   Patient will meet greater than or equal to 90% of their needs  MONITOR:   PO intake, Supplement acceptance, Labs, Weight trends, I & O's  REASON FOR ASSESSMENT:   Malnutrition Screening Tool    ASSESSMENT:   63 year old male with PMHx oc HLD, COPD, tobacco abuse, gout, CAD, HTN, chronic combined systolic and diastolic CHF, DM, A-fib, ischemic cardiomyopathy, hx CVA admitted with sepsis secondary to UTI, nausea and vomiting, AKI, A-fib with RVR, hyperbilirubinemia/transaminitis.   Met with patient at bedside. He reports he had a decreased appetite and intake PTA for approximately 2 weeks in setting of abdominal pain/N/V. He reports he is starting to feel better now. He reported to RD that he did not eat well at breakfast this morning but according to chart he finished 80% of the meal. Patient is requesting a chocolate protein shake. Will order once per day for now, but as appetite and intake improves he will likely not need one at baseline.  Patient reports he is weight stable. UBW is around 215-220 lbs. Currently 100.7 kg (222 lbs).  Meal Completion: 80% of breakfast this morning  Medications reviewed and include: Colace 100 mg BID, Novolog 0-15 units TID, Novolog 0-5 units QHS, senna 1 tablet BID, amiodarone gtt, ceftriaxone, famotidine.  Labs reviewed: CBG 116-159.  Patient does not meet criteria for malnutrition.  Discussed with RN and on rounds.  NUTRITION - FOCUSED PHYSICAL EXAM:    Most Recent Value  Orbital Region  No depletion  Upper Arm Region  No depletion  Thoracic and Lumbar Region  No depletion   Buccal Region  No depletion  Temple Region  No depletion  Clavicle Bone Region  No depletion  Clavicle and Acromion Bone Region  No depletion  Scapular Bone Region  No depletion  Dorsal Hand  No depletion  Patellar Region  No depletion  Anterior Thigh Region  No depletion  Posterior Calf Region  No depletion  Edema (RD Assessment)  Mild [bilateral lower extremities]  Hair  Reviewed  Eyes  Reviewed  Mouth  Unable to assess  Skin  Reviewed  Nails  Reviewed     Diet Order:   Diet Order            Diet heart healthy/carb modified Room service appropriate? Yes; Fluid consistency: Thin  Diet effective now             EDUCATION NEEDS:   Not appropriate for education at this time  Skin:  Skin Assessment: Reviewed RN Assessment(ecchymosis)  Last BM:  07/26/2018 - small type 7  Height:   Ht Readings from Last 1 Encounters:  07/25/18 6' (1.829 m)   Weight:   Wt Readings from Last 1 Encounters:  07/25/18 100.7 kg   Ideal Body Weight:  80.9 kg  BMI:  Body mass index is 30.11 kg/m.  Estimated Nutritional Needs:   Kcal:  2000-2200  Protein:  100-110 grams  Fluid:  2 L/day  Willey Blade, MS, RD, LDN Office: [phone] Pager: [phone] After Hours/Weekend Pager: [phone]

## 2018-07-27 NOTE — Telephone Encounter (Signed)
Called the patient to find out if he had been scheduled to see a cardiologist at the [place].I  was told by Juliette Alcide that the patient is in ICU at [facility] and has been since Sunday. Juliette Alcide stated that the patient's heart is only operating at 20% , he has a kidney infection and is septic. I expressed my apologies and she advised she would contact me if I can be of further assistance.

## 2018-07-27 NOTE — Progress Notes (Signed)
Progress Note  Patient Name: [name] Date of Encounter: 07/27/2018  Primary Cardiologist:   Subjective   Reports that he is starting to feel stronger, still far from his baseline, general weakness and malaise On amiodarone infusion rate 90-110 at rest atrial fibrillation  Atrial fibrillation history as detailed below Afib dating back to at least 02/2017, at which time he was cardioverted.  He was hospitalized in 12/2017, in the ED in 02/2018, and admitted 05/2018 and was noted to be in rate-controlled afib on all of those occasions.  We had recommended repeat outpt [facility] in July, however [facility] and it sounds like he hasn't been seen there in some time, due to difficulty scheduling.  Inpatient Medications    Scheduled Meds: . budesonide (PULMICORT) nebulizer solution  0.5 mg Nebulization BID  . carvedilol  25 mg Oral BID WC  . docusate sodium  100 mg Oral BID  . insulin aspart  0-15 Units Subcutaneous TID WC  . insulin aspart  0-5 Units Subcutaneous QHS  . ipratropium-albuterol  3 mL Nebulization Q4H  . mometasone-formoterol  2 puff Inhalation BID  . Rivaroxaban  15 mg Oral Q supper  . senna  1 tablet Oral BID  . traZODone  50 mg Oral QHS   Continuous Infusions: . sodium chloride 5 mL/hr at 07/26/18 1000  . amiodarone 30 mg/hr (07/27/18 0500)  . cefTRIAXone (ROCEPHIN)  IV 2 g (07/26/18 2141)  . diltiazem (CARDIZEM) infusion Stopped (07/26/18 1000)  . famotidine (PEPCID) IV 20 mg (07/27/18 0546)   PRN Meds: sodium chloride, guaiFENesin-codeine, morphine injection, nitroGLYCERIN, ondansetron **OR** ondansetron (ZOFRAN) IV   Vital Signs    Vitals:   07/27/18 0700 07/27/18 0746 07/27/18 0800 07/27/18 0842  BP: (!) 86/75  (!) 88/66 119/68  Pulse: (!) 46  64   Resp: 13  20   Temp:    97.8 F (36.6 C)  TempSrc:    Oral  SpO2:  94%    Weight:      Height:        Intake/Output Summary (Last 24 hours) at 07/27/2018 1039 Last data filed at 07/27/2018  0900 Gross per 24 hour  Intake 360 ml  Output 450 ml  Net -90 ml   Last 3 Weights 07/25/2018 07/25/2018 06/13/2018  Weight (lbs) 222 lb 0.1 oz 224 lb 216 lb 7.9 oz  Weight (kg) 100.7 kg 101.606 kg 98.2 kg      Telemetry    Atrial fibrillation rate 90-110- Personally Reviewed  ECG     - Personally Reviewed  Physical Exam   GEN: No acute distress.   Neck: No JVD Cardiac:  Irregularly irregular no murmurs, rubs, or gallops.  Respiratory: Clear to auscultation bilaterally. GI: Soft, nontender, non-distended  MS: No edema; No deformity. Neuro:  Nonfocal  Psych: Normal affect   Labs    Chemistry Recent Labs  Lab 07/25/18 1559 07/25/18 2033 07/26/18 0216  NA 133* 135 136  K 4.3 4.1 4.3  CL 94* 104 101  CO2 23 15* 21*  GLUCOSE 123* 120* 168*  BUN 59* 60* 62*  CREATININE 2.43* 2.31* 2.30*  CALCIUM 9.3 8.3* 8.4*  PROT 7.1  --  6.0*  ALBUMIN 3.4*  --  2.8*  AST 48*  --  39  ALT 37  --  31  ALKPHOS 131*  --  102  BILITOT 6.5*  --  4.7*  GFRNONAA 28* 29* 30*  GFRAA 32* 34* 34*  ANIONGAP 16* 16*  14     Hematology Recent Labs  Lab 07/25/18 1559 07/26/18 0216  WBC 18.3* 13.8*  RBC 5.85* 5.02  HGB 17.2* 14.8  HCT 53.8* 46.4  MCV 92.0 92.4  MCH 29.4 29.5  MCHC 32.0 31.9  RDW 18.0* 17.5*  PLT 197 166    Cardiac Enzymes Recent Labs  Lab 07/25/18 2033 07/26/18 0216 07/26/18 1338  TROPONINI 0.20* 0.43* 0.37*   No results for input(s): TROPIPOC in the last 168 hours.   BNPNo results for input(s): BNP, PROBNP in the last 168 hours.   DDimer No results for input(s): DDIMER in the last 168 hours.   Radiology    Ct Abdomen Pelvis Wo Contrast  Result Date: 07/25/2018 CLINICAL DATA:  Abdomen and pelvic pain. Nausea and vomiting for 7 days. Constipation. Elevated total bilirubin. EXAM: CT ABDOMEN AND PELVIS WITHOUT CONTRAST TECHNIQUE: Multidetector CT imaging of the abdomen and pelvis was performed following the standard protocol without IV contrast.  COMPARISON:  Renal ultrasound dated 01/20/2018. Abdomen and pelvis CT dated 01/15/2012. FINDINGS: Lower chest: Enlarged heart. Coronary artery calcifications and stents. Hepatobiliary: Paddock artery atheromatous calcifications in the liver. Prominent caudate lobe. Normal appearing gallbladder. Pancreas: Unremarkable. No pancreatic ductal dilatation or surrounding inflammatory changes. Spleen: Normal in size without focal abnormality. Adrenals/Urinary Tract: The left kidney remains larger than the right kidney. Interval bilateral perinephric soft tissue stranding. Arterial calcifications with no definite renal calculi. Normal appearing adrenal glands, ureters and urinary bladder. Stomach/Bowel: Unremarkable stomach, small bowel and colon. No evidence of appendicitis. Vascular/Lymphatic: Atheromatous arterial calcifications without aneurysm. No enlarged lymph nodes. Reproductive: Normal sized prostate gland containing coarse calcifications. Other: Small bilateral inguinal hernias containing fat. Moderate-sized right paraumbilical hernia containing fat. Musculoskeletal: Lumbar and lower thoracic spine degenerative changes. IMPRESSION: 1. Interval bilateral perinephric soft tissue stranding, possibly due to acute pyelonephritis. 2. Moderate-sized right paraumbilical hernia containing fat with an interval increase in size. Aortic Atherosclerosis (ICD10-I70.0). Electronically Signed   By: Beckie Salts M.D.   On: 07/25/2018 17:33   Dg Chest 1 View  Result Date: 07/25/2018 CLINICAL DATA:  Cough. Ex-smoker. EXAM: CHEST  1 VIEW COMPARISON:  03/04/2018. FINDINGS: Progressive enlargement of the cardiac silhouette. The aorta remains mildly tortuous and partially calcified. Clear lungs with normal vascularity, improved. No pleural fluid. Unremarkable bones. IMPRESSION: Progressive cardiomegaly. Otherwise, unremarkable examination. Electronically Signed   By: Beckie Salts M.D.   On: 07/25/2018 16:53   Dg Chest Port 1  View  Result Date: 07/26/2018 CLINICAL DATA:  Arrhythmia. EXAM: PORTABLE CHEST 1 VIEW COMPARISON:  07/25/2018 FINDINGS: Single view of the chest demonstrates stable enlargement of the cardiac silhouette. Atherosclerotic calcifications at the aortic arch. No focal airspace disease or overt pulmonary edema. No acute bone abnormality. Negative for a pneumothorax. IMPRESSION: Stable cardiomegaly.  No focal lung disease. Electronically Signed   By: Richarda Overlie M.D.   On: 07/26/2018 07:47    Cardiac Studies  Echocardiogram June 14, 2018 Left ventricle: The cavity size was mildly dilated. There was   mild concentric hypertrophy. Systolic function was moderately to   severely reduced. The estimated ejection fraction was in the   range of 30% to 35%. Diffuse hypokinesis. - Mitral valve: There was mild regurgitation. - Left atrium: The atrium was mildly dilated. - Right atrium: The atrium was mildly dilated. - Pulmonary arteries: Systolic pressure could not be accurately   estimated. - Inferior vena cava: The vessel was dilated. The respirophasic   diameter changes were blunted (< 50%), consistent with elevated  central venous pressure.   Patient Profile     [name] is a 63 y.o. male with a history of CAD s/p prior stenting to the RCA and RPDA, HFrEF w/ EF of 30-35% by echo 05/2018, HTN, HL, DMII, persistent afib/flutter, and carotid arterial dzs s/p recent stroke in 05/2018 - pending R CEA, presenting with shortness of breath, general malaise, Afib rvr and troponin elevation   Assessment & Plan    1.  Sepsis/Pyelonephritis:    2 wk h/o progressive malaise, fatigue, chills, anorexia, n, and vomiting. -On arrival with acute renal failure, lactic acidosis, leukocytosis, hypothermia, and afib w/ RVR.   -Still on IV fluids 100 cc/h  2.  Afib RVR:   Rates elevated on admission  On amiodarone infusion   Cont IV amio,  transition to oral amio later.   Cont xarelto.   Cont ? blocker as  bp tolerates.  3.  CAD/Elevated troponin:   Trop 0.20  0.43 in setting of sepsis and Afib RVR.    ECG non-acute.    Last cath in 06/2016 showed patent RCA/RPDA and LCX stents and otw nonobstructive dzs.  Consider ischemic evaluation at a later date  not a good cath candidate, thus stress testing (inpt vs outpt) might be more appropriate once he has fully recovered. -- statin.   No asa in setting of xarelto.  4.  Essential HTN/Hypotension:   Diuretics on hold.    5.  HL: LDL 79 in Dec.    statin.  6.  Chronic combined syst/diast CHF:    Diuretics on hold in setting of sepsis/hypotension/acute renal failure.  Cont ? blocker as bp allows.   7.  DMII:   Per IM.  8.  Acute on chronic renal failure Despite IV fluids, creatinine continues to trend higher concerning for ATN -Would recommend we slow IV fluids  8.  Carotid arterial dzs:  stroke in Dec w/ 80% right carotid stenosis @ the bifurcation.  Pending CEA w/ vascular surgery -  pt awaiting cardiology clearance from [facility].     Total encounter time more than 25 minutes  Greater than 50% was spent in counseling and coordination of care with the patient   For questions or updates, please contact [facility] Please consult [email or web address] for contact info under        Signed, Julien Nordmannimothy Gollan, MD  07/27/2018, 10:39 AM

## 2018-07-27 NOTE — Progress Notes (Signed)
[employer] - [place] [facility]   PATIENT NAME: [name]    MR#:  [id number]  DATE OF BIRTH:  09-24-1955  SUBJECTIVE:  CHIEF COMPLAINT:   Chief Complaint  Patient presents with  . Nausea  . Emesis  feels somewhat better, confused? REVIEW OF SYSTEMS:  Review of Systems  Constitutional: Positive for malaise/fatigue. Negative for diaphoresis, fever and weight loss.  HENT: Negative for ear discharge, ear pain, hearing loss, nosebleeds, sore throat and tinnitus.   Eyes: Negative for blurred vision and pain.  Respiratory: Negative for cough, hemoptysis, shortness of breath and wheezing.   Cardiovascular: Positive for chest pain. Negative for palpitations, orthopnea and leg swelling.  Gastrointestinal: Negative for abdominal pain, blood in stool, constipation, diarrhea, heartburn, nausea and vomiting.  Genitourinary: Negative for dysuria, frequency and urgency.  Musculoskeletal: Negative for back pain and myalgias.  Skin: Negative for itching and rash.  Neurological: Negative for dizziness, tingling, tremors, focal weakness, seizures, weakness and headaches.  Psychiatric/Behavioral: Negative for depression. The patient is not nervous/anxious.    DRUG ALLERGIES:   Allergies  Allergen Reactions  . Aspirin Other (See Comments)    Told not to take because of blood thinner  . Ibuprofen Nausea And Vomiting  . Tylenol [Acetaminophen] Nausea And Vomiting   VITALS:  Blood pressure 104/88, pulse (!) 53, temperature 97.8 F (36.6 C), temperature source Oral, resp. rate (!) 0, height 6' (1.829 m), weight 100.7 kg, SpO2 98 %. PHYSICAL EXAMINATION:  Physical Exam Constitutional:      General: He is in acute distress.     Appearance: He is ill-appearing and toxic-appearing.  HENT:     Head: Normocephalic and atraumatic.  Eyes:     Conjunctiva/sclera: Conjunctivae normal.     Pupils: Pupils are equal, round, and reactive to light.  Neck:     Musculoskeletal:  Normal range of motion and neck supple.     Thyroid: No thyromegaly.     Trachea: No tracheal deviation.  Cardiovascular:     Rate and Rhythm: Tachycardia present. Rhythm irregularly irregular.     Heart sounds: Normal heart sounds.  Pulmonary:     Effort: Pulmonary effort is normal. No respiratory distress.     Breath sounds: Normal breath sounds. No wheezing.  Chest:     Chest wall: No tenderness.  Abdominal:     General: Bowel sounds are normal. There is no distension.     Palpations: Abdomen is soft.     Tenderness: There is no abdominal tenderness.  Musculoskeletal: Normal range of motion.  Skin:    General: Skin is warm and dry.     Findings: No rash.  Neurological:     Mental Status: He is alert and oriented to person, place, and time.     Cranial Nerves: No cranial nerve deficit.    LABORATORY PANEL:  Male CBC Recent Labs  Lab 07/26/18 0216  WBC 13.8*  HGB 14.8  HCT 46.4  PLT 166   ------------------------------------------------------------------------------------------------------------------ Chemistries  Recent Labs  Lab 07/25/18 2033 07/26/18 0216  NA 135 136  K 4.1 4.3  CL 104 101  CO2 15* 21*  GLUCOSE 120* 168*  BUN 60* 62*  CREATININE 2.31* 2.30*  CALCIUM 8.3* 8.4*  MG 2.5*  --   AST  --  39  ALT  --  31  ALKPHOS  --  102  BILITOT  --  4.7*   RADIOLOGY:  No results found. ASSESSMENT AND PLAN:  . Sepsis secondary  to UTI (HCC)/hypothermia/ Lactic acidosis -Blood and urine cultures pending. -Suspect sepsis due to UTI based on the stranding seen in bilateral kidneys on the CT abdomen -continue IV Rocephin for now -Continue gentle IV fluid hydration.  Careful for fluid overload.  Patient's EF is 30 to 35%.  . Nausea and vomiting: resolved  . AKI (acute kidney injury) (HCC)/dehydration: can't rule out underlying CKD -Likely due to severe dehydration.  Gentle IV fluid hydration.  Tricked I's and O's.  BMP in a.m. -holding kdur, metformin,  aldactone and demadex -Antiemetics PRN  . Hyperbilirubinemia/transaminitis -May be due to sepsis -hep panel pending  . Atrial fibrillation with RVR (HCC) -Due to sepsis and severe dehydration.  Continue Amio drip and switch over to PO per cardio recommendations -continue coreg with hold parameters and xarelto - Cardio to consider cardioversion once acute illness improves  Diabetes -hold metformin, continue SSI  . CAD (coronary artery disease)/chronic systolic heart failure -Patient complains of chest pain suspect that this is more pleuritic in nature. Troponin peak to 0.4 likley due to demand ischemia   . COPD (chronic obstructive pulmonary disease) (HCC) -Stable, home meds resumed.  DuoNeb  . HLD (hyperlipidemia) -hold Simvastation  Obstructive sleep apnea -Every hour sleep CPAP ordered   Overall poor prognosis  All the records are reviewed and case discussed with Care Management/Social Worker. Management plans discussed with the patient, Intensivist and they are in agreement.  CODE STATUS: Partial Code  TOTAL TIME TAKING CARE OF THIS PATIENT: 15 minutes.   More than 50% of the time was spent in counseling/coordination of care: YES  POSSIBLE D/C IN 2-3 DAYS, DEPENDING ON CLINICAL CONDITION.   Delfino Lovett M.D on 07/27/2018 at 4:47 PM  Between 7am to 6pm - Pager - [phone]  After 6pm go to [email or web address] - Scientist, research (life sciences) [facility]  Office  [phone]  CC: Primary care physician; Administration, Veterans  Note: This dictation was prepared with Nurse, children's dictation along with smaller Lobbyist. Any transcriptional errors that result from this process are unintentional.

## 2018-07-28 DIAGNOSIS — N390 Urinary tract infection, site not specified: Secondary | ICD-10-CM

## 2018-07-28 DIAGNOSIS — Z720 Tobacco use: Secondary | ICD-10-CM

## 2018-07-28 DIAGNOSIS — I420 Dilated cardiomyopathy: Secondary | ICD-10-CM

## 2018-07-28 DIAGNOSIS — R112 Nausea with vomiting, unspecified: Secondary | ICD-10-CM

## 2018-07-28 DIAGNOSIS — E872 Acidosis: Secondary | ICD-10-CM

## 2018-07-28 LAB — GLUCOSE, CAPILLARY
Glucose-Capillary: 126 mg/dL — ABNORMAL HIGH (ref 70–99)
Glucose-Capillary: 182 mg/dL — ABNORMAL HIGH (ref 70–99)
Glucose-Capillary: 132 mg/dL — ABNORMAL HIGH (ref 70–99)
Glucose-Capillary: 94 mg/dL (ref 70–99)

## 2018-07-28 LAB — CBC
HCT: 41.2 % (ref 39.0–52.0)
Hemoglobin: 13 g/dL (ref 13.0–17.0)
MCH: 29.1 pg (ref 26.0–34.0)
MCHC: 31.6 g/dL (ref 30.0–36.0)
MCV: 92.4 fL (ref 80.0–100.0)
PLATELETS: 133 10*3/uL — AB (ref 150–400)
RBC: 4.46 MIL/uL (ref 4.22–5.81)
RDW: 17.2 % — ABNORMAL HIGH (ref 11.5–15.5)
WBC: 9 10*3/uL (ref 4.0–10.5)
nRBC: 0.4 % — ABNORMAL HIGH (ref 0.0–0.2)

## 2018-07-28 LAB — BASIC METABOLIC PANEL
Anion gap: 11 (ref 5–15)
BUN: 73 mg/dL — ABNORMAL HIGH (ref 8–23)
CO2: 29 mmol/L (ref 22–32)
Calcium: 8.5 mg/dL — ABNORMAL LOW (ref 8.9–10.3)
Chloride: 91 mmol/L — ABNORMAL LOW (ref 98–111)
Creatinine, Ser: 2.39 mg/dL — ABNORMAL HIGH (ref 0.61–1.24)
GFR calc Af Amer: 33 mL/min — ABNORMAL LOW (ref >60)
GFR calc non Af Amer: 28 mL/min — ABNORMAL LOW (ref >60)
Glucose, Bld: 134 mg/dL — ABNORMAL HIGH (ref 70–99)
Potassium: 3.5 mmol/L (ref 3.5–5.1)
Sodium: 131 mmol/L — ABNORMAL LOW (ref 135–145)

## 2018-07-28 MED ORDER — POTASSIUM CHLORIDE CRYS ER 20 MEQ PO TBCR
40.0000 meq | EXTENDED_RELEASE_TABLET | Freq: Once | ORAL | Status: AC
  Administered 2018-07-28: 40 meq via ORAL
  Filled 2018-07-28: qty 2

## 2018-07-28 MED ORDER — IPRATROPIUM-ALBUTEROL 0.5-2.5 (3) MG/3ML IN SOLN
3.0000 mL | Freq: Two times a day (BID) | RESPIRATORY_TRACT | Status: DC
  Administered 2018-07-29 – 2018-07-31 (×5): 3 mL via RESPIRATORY_TRACT
  Filled 2018-07-28 (×5): qty 3

## 2018-07-28 MED ORDER — AMIODARONE HCL 200 MG PO TABS
400.0000 mg | ORAL_TABLET | Freq: Two times a day (BID) | ORAL | Status: DC
  Administered 2018-07-28 – 2018-07-31 (×6): 400 mg via ORAL
  Filled 2018-07-28 (×6): qty 2

## 2018-07-28 MED ORDER — AMIODARONE HCL 200 MG PO TABS
200.0000 mg | ORAL_TABLET | Freq: Two times a day (BID) | ORAL | Status: DC
  Administered 2018-07-28: 200 mg via ORAL
  Filled 2018-07-28: qty 1

## 2018-07-28 NOTE — Consult Note (Signed)
PULMONARY / CRITICAL CARE MEDICINE  Name: [name] MRN: [id number] DOB: [birth year]    LOS: 3  Referring Provider: Dr. Joneen Roachrosley Reason for Referral: Sepsis and A. fib with RVR  CC resp failure HPI Disoriented but alert and awake NAD On amiodarone infusions BP improved    Review of Systems:  Gen:  Denies  fever, sweats, chills weigh loss  HEENT: Denies blurred vision, double vision, ear pain, eye pain, hearing loss, nose bleeds, sore throat Cardiac:  No dizziness, chest pain or heaviness, chest tightness,edema, No JVD Resp:   No cough, -sputum production, +shortness of breath,-wheezing, -hemoptysis,  Gi: Denies swallowing difficulty, stomach pain, nausea or vomiting, diarrhea, constipation, bowel incontinence Gu:  Denies bladder incontinence, burning urine Ext:   Denies Joint pain, stiffness or swelling Skin: Denies  skin rash, easy bruising or bleeding or hives Endoc:  Denies polyuria, polydipsia , polyphagia or weight change Psych:   Denies depression, insomnia or hallucinations  Other:  All other systems negative   VITAL SIGNS: BP 99/75   Pulse 67   Temp 98.2 F (36.8 C) (Oral)   Resp 18   Ht 6' (1.829 m)   Wt 100.7 kg   SpO2 97%   BMI 30.11 kg/m   HEMODYNAMICS:    VENTILATOR SETTINGS: FiO2 (%):  [28 %] 28 %  INTAKE / OUTPUT: I/O last 3 completed shifts: In: 700 [P.O.:600; IV Piggyback:100] Out: 1300 [Urine:1300]  Physical Examination:   GENERAL:NAD, no fevers, chills, +fatigue HEAD: Normocephalic, atraumatic.  EYES: PERLA, EOMI No scleral icterus.  MOUTH: Moist mucosal membrane.  EAR, NOSE, THROAT: Clear without exudates. No external lesions.  NECK: Supple. No thyromegaly.  No JVD.  PULMONARY: CTA B/L no wheezing, rhonchi, crackles CARDIOVASCULAR: S1 and S2. IRRegular rate and rhythm. No murmurs GASTROINTESTINAL: Soft, nontender, nondistended. Positive bowel sounds.  MUSCULOSKELETAL: No swelling, clubbing, or edema.  NEUROLOGIC: No gross  focal neurological deficits. 5/5 strength all extremities SKIN: No ulceration, lesions, rashes, or cyanosis.  PSYCHIATRIC-depression -anxiety ALL OTHER ROS ARE NEGATIVE      LABS:  BMET Recent Labs  Lab 07/25/18 2033 07/26/18 0216 07/28/18 0404  NA 135 136 131*  K 4.1 4.3 3.5  CL 104 101 91*  CO2 15* 21* 29  BUN 60* 62* 73*  CREATININE 2.31* 2.30* 2.39*  GLUCOSE 120* 168* 134*    Electrolytes Recent Labs  Lab 07/25/18 2033 07/26/18 0216 07/28/18 0404  CALCIUM 8.3* 8.4* 8.5*  MG 2.5*  --   --   PHOS 5.2*  --   --     CBC Recent Labs  Lab 07/25/18 1559 07/26/18 0216 07/28/18 0404  WBC 18.3* 13.8* 9.0  HGB 17.2* 14.8 13.0  HCT 53.8* 46.4 41.2  PLT 197 166 133*    Coag's Recent Labs  Lab 07/25/18 1717 07/26/18 0216  INR 1.51 2.00    Sepsis Markers Recent Labs  Lab 07/25/18 1559 07/25/18 2024 07/25/18 2033 07/26/18 0216 07/27/18 0444  LATICACIDVEN 3.2* 2.5*  --   --   --   PROCALCITON  --   --  8.40 7.80 6.37    ABG No results for input(s): PHART, PCO2ART, PO2ART in the last 168 hours.  Liver Enzymes Recent Labs  Lab 07/25/18 1559 07/26/18 0216  AST 48* 39  ALT 37 31  ALKPHOS 131* 102  BILITOT 6.5* 4.7*  ALBUMIN 3.4* 2.8*    Cardiac Enzymes Recent Labs  Lab 07/25/18 2033 07/26/18 0216 07/26/18 1338  TROPONINI 0.20* 0.43* 0.37*    Glucose  Recent Labs  Lab 07/26/18 2154 07/27/18 0718 07/27/18 1130 07/27/18 1640 07/27/18 2123 07/28/18 0736  GLUCAP 141* 116* 156* 174* 171* 126*    Imaging No results found.  CULTURES: Blood cultures x2 Urine culture  ANTIBIOTICS: Given cefepime and vancomycin in the ED Started on ceftriaxone in the ICU  SIGNIFICANT EVENTS: 07/25/2018 admitted 06/13/2018: Admitted with CVA  LINES/TUBES: Peripheral IVs  DISCUSSION: 63 year old male admitted with urosepsis secondary to pyelonephritis, acute renal failure secondary to volume depletion and A. fib with RVR refractory to  amiodarone and diltiazem  ASSESSMENT  Sepsis from pyelonephritis-resolving Continue IV ABX Follow up cultures   Afib with RVR on amiodarone-follow up cardiology recs   Renal failure-watch UOP and chem 7   sCHF EF 30% Stop IVF's Lasix as tolerated     Overall all poor prognosis Patient is DNI  Ok to transfer to gen med floor     Terry Abila Santiago Glad, M.D.  [facility]  Medical Director [facility] Medical Director [facility] Cardio-Pulmonary Department

## 2018-07-28 NOTE — Progress Notes (Signed)
PHARMACY CONSULT NOTE - FOLLOW UP  Pharmacy Consult for Electrolyte Monitoring and Replacement   Recent Labs: Potassium (mmol/L)  Date Value  07/28/2018 3.5  12/11/2012 4.2   Magnesium (mg/dL)  Date Value  98/26/4158 2.5 (H)   Calcium (mg/dL)  Date Value  30/94/0768 8.5 (L)   Calcium, Total (mg/dL)  Date Value  08/81/1031 8.8   Albumin (g/dL)  Date Value  59/45/8592 2.8 (L)  12/11/2012 3.3 (L)   Phosphorus (mg/dL)  Date Value  92/44/6286 5.2 (H)   Sodium (mmol/L)  Date Value  07/28/2018 131 (L)  12/21/2012 148 (H)  12/11/2012 141     Assessment: Patient is a 63yo male admitted for urosepsis and afib with RVR. Pharmacy consulted for electrolyte management.  Goal of Therapy:  K ~4.0, Mag ~2.0  Plan:  1/29 K 3.5, will supplement with KCl PO times 1 to target a goal K of 4.0. Follow up on AM labs.  Clovia Cuff, PharmD, BCPS 07/28/2018 2:58 PM

## 2018-07-28 NOTE — Progress Notes (Signed)
Progress Note  Patient Name: [name] Harrington Date of Encounter: 07/28/2018  Primary Cardiologist:   Subjective   Family at bedside, trying to reposition him in bed He is unable to reposition himself,  They report he is eating well, He denies any significant shortness of breath or chest discomfort  Atrial fibrillation history as detailed below Afib dating back to at least 02/2017, at which time he was cardioverted.   hospitalized in 12/2017, in the ED in 02/2018, and admitted 05/2018 and was noted to be in rate-controlled afib on all of those occasions.  We had recommended repeat outpt [facility] in July, however [facility] and it sounds like he hasn't been seen there in some time, due to difficulty scheduling.  This admission presenting with atrial fibrillation with RVR in the setting of pyelonephritis and underlying sepsis, started on amiodarone for rate control Diltiazem was held  Inpatient Medications    Scheduled Meds: . amiodarone  200 mg Oral BID  . budesonide (PULMICORT) nebulizer solution  0.5 mg Nebulization BID  . carvedilol  25 mg Oral BID WC  . docusate sodium  100 mg Oral BID  . insulin aspart  0-15 Units Subcutaneous TID WC  . insulin aspart  0-5 Units Subcutaneous QHS  . ipratropium-albuterol  3 mL Nebulization Q4H  . mometasone-formoterol  2 puff Inhalation BID  . ENSURE MAX PROTEIN  11 oz Oral Daily  . Rivaroxaban  15 mg Oral Q supper  . senna  1 tablet Oral BID  . traZODone  50 mg Oral QHS   Continuous Infusions: . sodium chloride 5 mL/hr at 07/26/18 1000  . cefTRIAXone (ROCEPHIN)  IV 2 g (07/27/18 2117)  . famotidine (PEPCID) IV 20 mg (07/28/18 0545)   PRN Meds: sodium chloride, guaiFENesin-codeine, morphine injection, nitroGLYCERIN, ondansetron **OR** ondansetron (ZOFRAN) IV   Vital Signs    Vitals:   07/28/18 1400 07/28/18 1554 07/28/18 1600 07/28/18 1700  BP: 114/86  114/90 (!) 124/98  Pulse: (!) 59 70 78 64  Resp: (!) 21 11 20 12   Temp:        TempSrc:      SpO2:  98%    Weight:      Height:        Intake/Output Summary (Last 24 hours) at 07/28/2018 1752 Last data filed at 07/28/2018 1014 Gross per 24 hour  Intake 460 ml  Output 700 ml  Net -240 ml   Last 3 Weights 07/25/2018 07/25/2018 06/13/2018  Weight (lbs) 222 lb 0.1 oz 224 lb 216 lb 7.9 oz  Weight (kg) 100.7 kg 101.606 kg 98.2 kg      Telemetry    Atrial fibrillation rate 90-100- Personally Reviewed  ECG     - Personally Reviewed  Physical Exam   Constitutional: Not oriented, no distress HENT:  Head: Grossly normal Eyes:  no discharge. No scleral icterus.  Neck: Unable to estimate JVD, no carotid bruits  Cardiovascular: Irregularly irregular no murmurs appreciated Pulmonary/Chest: Clear to auscultation bilaterally, no wheezes or rails Abdominal: Soft.  no distension.  no tenderness.  Musculoskeletal: Normal range of motion Neurological:  normal muscle tone. Coordination normal. No atrophy Skin: Skin warm and dry Psychiatric: Somewhat confused, minimally conversant  Labs    Chemistry Recent Labs  Lab 07/25/18 1559 07/25/18 2033 07/26/18 0216 07/28/18 0404  NA 133* 135 136 131*  K 4.3 4.1 4.3 3.5  CL 94* 104 101 91*  CO2 23 15* 21* 29  GLUCOSE 123* 120* 168*  134*  BUN 59* 60* 62* 73*  CREATININE 2.43* 2.31* 2.30* 2.39*  CALCIUM 9.3 8.3* 8.4* 8.5*  PROT 7.1  --  6.0*  --   ALBUMIN 3.4*  --  2.8*  --   AST 48*  --  39  --   ALT 37  --  31  --   ALKPHOS 131*  --  102  --   BILITOT 6.5*  --  4.7*  --   GFRNONAA 28* 29* 30* 28*  GFRAA 32* 34* 34* 33*  ANIONGAP 16* 16* 14 11     Hematology Recent Labs  Lab 07/25/18 1559 07/26/18 0216 07/28/18 0404  WBC 18.3* 13.8* 9.0  RBC 5.85* 5.02 4.46  HGB 17.2* 14.8 13.0  HCT 53.8* 46.4 41.2  MCV 92.0 92.4 92.4  MCH 29.4 29.5 29.1  MCHC 32.0 31.9 31.6  RDW 18.0* 17.5* 17.2*  PLT 197 166 133*    Cardiac Enzymes Recent Labs  Lab 07/25/18 2033 07/26/18 0216 07/26/18 1338  TROPONINI  0.20* 0.43* 0.37*   No results for input(s): TROPIPOC in the last 168 hours.   BNPNo results for input(s): BNP, PROBNP in the last 168 hours.   DDimer No results for input(s): DDIMER in the last 168 hours.   Radiology    No results found.  Cardiac Studies  Echocardiogram June 14, 2018 Left ventricle: The cavity size was mildly dilated. There was   mild concentric hypertrophy. Systolic function was moderately to   severely reduced. The estimated ejection fraction was in the   range of 30% to 35%. Diffuse hypokinesis. - Mitral valve: There was mild regurgitation. - Left atrium: The atrium was mildly dilated. - Right atrium: The atrium was mildly dilated. - Pulmonary arteries: Systolic pressure could not be accurately   estimated. - Inferior vena cava: The vessel was dilated. The respirophasic   diameter changes were blunted (< 50%), consistent with elevated   central venous pressure.   Patient Profile     [name] is a 63 y.o. male with a history of CAD s/p prior stenting to the RCA and RPDA, HFrEF w/ EF of 30-35% by echo 05/2018, HTN, HL, DMII, persistent afib/flutter, and carotid arterial dzs s/p recent stroke in 05/2018 - pending R CEA, presenting with shortness of breath, general malaise, Afib rvr and troponin elevation   Assessment & Plan    1.  Sepsis/Pyelonephritis:   Slow improvement in his symptoms, still feels weak Unable to move, reposition in bed Would likely have slow recovery  2.  Afib RVR:   Rates elevated on admission , improved on amiodarone infusion ICU team has changed him to oral dosing 200 twice daily And effort to get better load will change to 400 oral dosing twice daily Cont xarelto.   Cont ? blocker as bp tolerates. --Consider cardioversion January 31  3.  CAD/Elevated troponin:   Trop 0.20  0.43 in setting of sepsis and Afib RVR.    ECG non-acute.    Last cath in 06/2016 showed patent RCA/RPDA and LCX stents and otw nonobstructive  dzs.  --Consider stress testing (inpt vs outpt) might be more appropriate once he has fully recovered. -- statin.   No asa in setting of xarelto.  4.  Essential HTN/Hypotension:   Diuretics on hold.   Blood pressure stable  5.  HL: LDL 79 in Dec.    statin.  6.  Chronic combined syst/diast CHF:    Diuretics on hold in setting of sepsis/hypotension/acute renal failure.  Cont ? blocker as bp allows. IV fluids held  7.  DMII:   Per IM.  8.  Acute on chronic renal failure No significant improved despite IV fluids Likely underlying renal failure from diabetes with ATN admission in the setting of infection/sepsis  8.  Carotid arterial dzs:  stroke in Dec w/ 80% right carotid stenosis @ the bifurcation.  Pending CEA w/ vascular surgery -  pt awaiting cardiology clearance from [place].     Total encounter time more than 25 minutes  Greater than 50% was spent in counseling and coordination of care with the patient   For questions or updates, please contact [facility] Please consult [email or web address] for contact info under        Signed, Julien Nordmann, MD  07/28/2018, 5:52 PM

## 2018-07-28 NOTE — Progress Notes (Signed)
[employer] -  at [facility]   PATIENT NAME: [name]    MR#:  [id number]  DATE OF BIRTH:  [birth year]  SUBJECTIVE:  CHIEF COMPLAINT:   Chief Complaint  Patient presents with  . Nausea  . Emesis  feels somewhat better, confused? REVIEW OF SYSTEMS:  Review of Systems  Constitutional: Positive for malaise/fatigue. Negative for diaphoresis, fever and weight loss.  HENT: Negative for ear discharge, ear pain, hearing loss, nosebleeds, sore throat and tinnitus.   Eyes: Negative for blurred vision and pain.  Respiratory: Negative for cough, hemoptysis, shortness of breath and wheezing.   Cardiovascular: Positive for chest pain. Negative for palpitations, orthopnea and leg swelling.  Gastrointestinal: Negative for abdominal pain, blood in stool, constipation, diarrhea, heartburn, nausea and vomiting.  Genitourinary: Negative for dysuria, frequency and urgency.  Musculoskeletal: Negative for back pain and myalgias.  Skin: Negative for itching and rash.  Neurological: Negative for dizziness, tingling, tremors, focal weakness, seizures, weakness and headaches.  Psychiatric/Behavioral: Negative for depression. The patient is not nervous/anxious.    DRUG ALLERGIES:   Allergies  Allergen Reactions  . Aspirin Other (See Comments)    Told not to take because of blood thinner  . Ibuprofen Nausea And Vomiting  . Tylenol [Acetaminophen] Nausea And Vomiting   VITALS:  Blood pressure 114/86, pulse 70, temperature 97.6 F (36.4 C), temperature source Oral, resp. rate 11, height 6' (1.829 m), weight 100.7 kg, SpO2 98 %. PHYSICAL EXAMINATION:  Physical Exam Constitutional:      General: He is in acute distress.     Appearance: He is ill-appearing and toxic-appearing.  HENT:     Head: Normocephalic and atraumatic.  Eyes:     Conjunctiva/sclera: Conjunctivae normal.     Pupils: Pupils are equal, round, and reactive to light.  Neck:     Musculoskeletal: Normal  range of motion and neck supple.     Thyroid: No thyromegaly.     Trachea: No tracheal deviation.  Cardiovascular:     Rate and Rhythm: Tachycardia present. Rhythm irregularly irregular.     Heart sounds: Normal heart sounds.  Pulmonary:     Effort: Pulmonary effort is normal. No respiratory distress.     Breath sounds: Normal breath sounds. No wheezing.  Chest:     Chest wall: No tenderness.  Abdominal:     General: Bowel sounds are normal. There is no distension.     Palpations: Abdomen is soft.     Tenderness: There is no abdominal tenderness.  Musculoskeletal: Normal range of motion.  Skin:    General: Skin is warm and dry.     Findings: No rash.  Neurological:     Mental Status: He is alert and oriented to person, place, and time.     Cranial Nerves: No cranial nerve deficit.    LABORATORY PANEL:  Male CBC Recent Labs  Lab 07/28/18 0404  WBC 9.0  HGB 13.0  HCT 41.2  PLT 133*   ------------------------------------------------------------------------------------------------------------------ Chemistries  Recent Labs  Lab 07/25/18 2033 07/26/18 0216 07/28/18 0404  NA 135 136 131*  K 4.1 4.3 3.5  CL 104 101 91*  CO2 15* 21* 29  GLUCOSE 120* 168* 134*  BUN 60* 62* 73*  CREATININE 2.31* 2.30* 2.39*  CALCIUM 8.3* 8.4* 8.5*  MG 2.5*  --   --   AST  --  39  --   ALT  --  31  --   ALKPHOS  --  102  --  BILITOT  --  4.7*  --    RADIOLOGY:  No results found. ASSESSMENT AND PLAN:  . Sepsis secondary to UTI (HCC)/hypothermia/ Lactic acidosis -Blood and urine cultures pending. -Suspect sepsis due to UTI based on the stranding seen in bilateral kidneys on the CT abdomen -continue IV Rocephin for now. Can switch to PO soon. -Continue gentle IV fluid hydration.  Careful for fluid overload.  Patient's EF is 30 to 35%.  . Nausea and vomiting: resolved  . AKI (acute kidney injury) (HCC)/dehydration: can't rule out underlying CKD -Likely due to severe dehydration.   Continue IV fluid hydration.  -holding kdur, metformin, aldactone and demadex -Antiemetics PRN  . Hyperbilirubinemia/transaminitis -May be due to sepsis -hep panel pending  . Atrial fibrillation with RVR (HCC) -Due to sepsis and severe dehydration.  Continue Amio drip and switch over to PO per cardio recommendations -continue coreg with hold parameters and xarelto - Cardio to consider cardioversion once acute illness improves  Diabetes -hold metformin, continue SSI  . CAD (coronary artery disease)/chronic systolic heart failure - pleuritic chest pain. Troponin peak to 0.4 likley due to demand ischemia   . COPD (chronic obstructive pulmonary disease) (HCC) -Stable, home meds resumed.  DuoNeb  . HLD (hyperlipidemia) -hold Simvastation  Obstructive sleep apnea -Every hour sleep CPAP ordered   Overall poor prognosis. Ok to transfer to floor   All the records are reviewed and case discussed with Care Management/Social Worker. Management plans discussed with the patient, Intensivist and they are in agreement.  CODE STATUS: Partial Code  TOTAL TIME TAKING CARE OF THIS PATIENT: 15 minutes.   More than 50% of the time was spent in counseling/coordination of care: YES  POSSIBLE D/C IN 1-2 DAYS, DEPENDING ON CLINICAL CONDITION.   Delfino Lovett M.D on 07/28/2018 at 3:59 PM  Between 7am to 6pm - Pager - [phone]  After 6pm go to [email or web address] - Scientist, research (life sciences) [facility]  Office  [phone]  CC: Primary care physician; Administration, Veterans  Note: This dictation was prepared with Nurse, children's dictation along with smaller Lobbyist. Any transcriptional errors that result from this process are unintentional.

## 2018-07-29 LAB — BASIC METABOLIC PANEL
Anion gap: 11 (ref 5–15)
BUN: 62 mg/dL — ABNORMAL HIGH (ref 8–23)
CHLORIDE: 94 mmol/L — AB (ref 98–111)
CO2: 29 mmol/L (ref 22–32)
Calcium: 8.6 mg/dL — ABNORMAL LOW (ref 8.9–10.3)
Creatinine, Ser: 2.12 mg/dL — ABNORMAL HIGH (ref 0.61–1.24)
GFR calc Af Amer: 38 mL/min — ABNORMAL LOW (ref >60)
GFR calc non Af Amer: 33 mL/min — ABNORMAL LOW (ref >60)
Glucose, Bld: 101 mg/dL — ABNORMAL HIGH (ref 70–99)
Potassium: 3.3 mmol/L — ABNORMAL LOW (ref 3.5–5.1)
Sodium: 134 mmol/L — ABNORMAL LOW (ref 135–145)

## 2018-07-29 LAB — GLUCOSE, CAPILLARY
GLUCOSE-CAPILLARY: 117 mg/dL — AB (ref 70–99)
GLUCOSE-CAPILLARY: 185 mg/dL — AB (ref 70–99)
GLUCOSE-CAPILLARY: 159 mg/dL — AB (ref 70–99)
Glucose-Capillary: 209 mg/dL — ABNORMAL HIGH (ref 70–99)

## 2018-07-29 LAB — MAGNESIUM: MAGNESIUM: 2.6 mg/dL — AB (ref 1.7–2.4)

## 2018-07-29 MED ORDER — ATORVASTATIN CALCIUM 20 MG PO TABS
40.0000 mg | ORAL_TABLET | Freq: Every day | ORAL | Status: DC
  Administered 2018-07-29 – 2018-07-30 (×2): 40 mg via ORAL
  Filled 2018-07-29 (×2): qty 2

## 2018-07-29 MED ORDER — POTASSIUM CHLORIDE CRYS ER 20 MEQ PO TBCR
20.0000 meq | EXTENDED_RELEASE_TABLET | Freq: Once | ORAL | Status: AC
  Administered 2018-07-29: 20 meq via ORAL
  Filled 2018-07-29: qty 1

## 2018-07-29 MED ORDER — FAMOTIDINE 20 MG PO TABS
20.0000 mg | ORAL_TABLET | Freq: Every day | ORAL | Status: DC
  Administered 2018-07-29 – 2018-07-31 (×3): 20 mg via ORAL
  Filled 2018-07-29 (×3): qty 1

## 2018-07-29 MED ORDER — POTASSIUM CHLORIDE CRYS ER 20 MEQ PO TBCR
40.0000 meq | EXTENDED_RELEASE_TABLET | Freq: Once | ORAL | Status: AC
  Administered 2018-07-29: 40 meq via ORAL
  Filled 2018-07-29: qty 2

## 2018-07-29 NOTE — Progress Notes (Signed)
Progress Note  Patient Name: [name] Date of Encounter: 07/29/2018  Primary Cardiologist:   Subjective   Atrial fibrillation history as detailed below Afib dating back to at least 02/2017, at which time he was cardioverted.   hospitalized in 12/2017, in the ED in 02/2018, and admitted 05/2018 and was noted to be in rate-controlled afib on all of those occasions.  We had recommended repeat outpt DCCV in July, however [facility] and it sounds like he hasn't been seen there in some time, due to difficulty scheduling.  This admission presenting with atrial fibrillation with RVR in the setting of pyelonephritis and underlying sepsis.  He feels better overall with less fatigue and shortness of breath.  He continues to be in atrial fibrillation but ventricular rate is more controlled than before.  Inpatient Medications    Scheduled Meds: . amiodarone  400 mg Oral BID  . budesonide (PULMICORT) nebulizer solution  0.5 mg Nebulization BID  . carvedilol  25 mg Oral BID WC  . docusate sodium  100 mg Oral BID  . famotidine  20 mg Oral Daily  . insulin aspart  0-15 Units Subcutaneous TID WC  . insulin aspart  0-5 Units Subcutaneous QHS  . ipratropium-albuterol  3 mL Nebulization BID  . mometasone-formoterol  2 puff Inhalation BID  . ENSURE MAX PROTEIN  11 oz Oral Daily  . Rivaroxaban  15 mg Oral Q supper  . senna  1 tablet Oral BID  . traZODone  50 mg Oral QHS   Continuous Infusions: . sodium chloride 5 mL/hr at 07/26/18 1000  . cefTRIAXone (ROCEPHIN)  IV Stopped (07/28/18 2241)   PRN Meds: sodium chloride, guaiFENesin-codeine, morphine injection, nitroGLYCERIN, ondansetron **OR** ondansetron (ZOFRAN) IV   Vital Signs    Vitals:   07/28/18 1934 07/28/18 1941 07/29/18 0359 07/29/18 0745  BP:  102/86 112/75 104/88  Pulse:  65 62 91  Resp:  20 18   Temp:  98 F (36.7 C) 97.6 F (36.4 C) 98 F (36.7 C)  TempSrc:  Oral Oral Oral  SpO2: 97% 100% 96% 100%  Weight:    108.6 kg   Height:        Intake/Output Summary (Last 24 hours) at 07/29/2018 0826 Last data filed at 07/29/2018 0748 Gross per 24 hour  Intake 360 ml  Output 800 ml  Net -440 ml   Last 3 Weights 07/29/2018 07/28/2018 07/25/2018  Weight (lbs) 239 lb 8 oz 242 lb 9.6 oz 222 lb 0.1 oz  Weight (kg) 108.636 kg 110.043 kg 100.7 kg      Telemetry    Atrial fibrillation rate 80-100- Personally Reviewed  ECG     - Personally Reviewed  Physical Exam   Constitutional: Not oriented, no distress HENT:  Head: Grossly normal Eyes:  no discharge. No scleral icterus.  Neck: Unable to estimate JVD, no carotid bruits  Cardiovascular: Irregularly irregular. no murmurs appreciated Pulmonary/Chest: Clear to auscultation bilaterally, no wheezes or rails Abdominal: Soft.  no distension.  no tenderness.  Musculoskeletal: Normal range of motion Neurological:  normal muscle tone. Coordination normal. No atrophy Skin: Skin warm and dry Psychiatric: Somewhat confused, minimally conversant  Labs    Chemistry Recent Labs  Lab 07/25/18 1559  07/26/18 0216 07/28/18 0404 07/29/18 0343  NA 133*   < > 136 131* 134*  K 4.3   < > 4.3 3.5 3.3*  CL 94*   < > 101 91* 94*  CO2 23   < >  21* 29 29  GLUCOSE 123*   < > 168* 134* 101*  BUN 59*   < > 62* 73* 62*  CREATININE 2.43*   < > 2.30* 2.39* 2.12*  CALCIUM 9.3   < > 8.4* 8.5* 8.6*  PROT 7.1  --  6.0*  --   --   ALBUMIN 3.4*  --  2.8*  --   --   AST 48*  --  39  --   --   ALT 37  --  31  --   --   ALKPHOS 131*  --  102  --   --   BILITOT 6.5*  --  4.7*  --   --   GFRNONAA 28*   < > 30* 28* 33*  GFRAA 32*   < > 34* 33* 38*  ANIONGAP 16*   < > 14 11 11    < > = values in this interval not displayed.     Hematology Recent Labs  Lab 07/25/18 1559 07/26/18 0216 07/28/18 0404  WBC 18.3* 13.8* 9.0  RBC 5.85* 5.02 4.46  HGB 17.2* 14.8 13.0  HCT 53.8* 46.4 41.2  MCV 92.0 92.4 92.4  MCH 29.4 29.5 29.1  MCHC 32.0 31.9 31.6  RDW 18.0* 17.5* 17.2*   PLT 197 166 133*    Cardiac Enzymes Recent Labs  Lab 07/25/18 2033 07/26/18 0216 07/26/18 1338  TROPONINI 0.20* 0.43* 0.37*   No results for input(s): TROPIPOC in the last 168 hours.   BNPNo results for input(s): BNP, PROBNP in the last 168 hours.   DDimer No results for input(s): DDIMER in the last 168 hours.   Radiology    No results found.  Cardiac Studies  Echocardiogram June 14, 2018 Left ventricle: The cavity size was mildly dilated. There was   mild concentric hypertrophy. Systolic function was moderately to   severely reduced. The estimated ejection fraction was in the   range of 30% to 35%. Diffuse hypokinesis. - Mitral valve: There was mild regurgitation. - Left atrium: The atrium was mildly dilated. - Right atrium: The atrium was mildly dilated. - Pulmonary arteries: Systolic pressure could not be accurately   estimated. - Inferior vena cava: The vessel was dilated. The respirophasic   diameter changes were blunted (< 50%), consistent with elevated   central venous pressure.   Patient Profile     [name] is a 63 y.o. male with a history of CAD s/p prior stenting to the RCA and RPDA, HFrEF w/ EF of 30-35% by echo 05/2018, HTN, HL, DMII, persistent afib/flutter, and carotid arterial dzs s/p recent stroke in 05/2018 - pending R CEA, presenting with shortness of breath, general malaise, Afib rvr and troponin elevation   Assessment & Plan    1.  Sepsis/Pyelonephritis:   Slow improvement in his symptoms.  2.  Afib RVR:   Rates elevated on admission , improved on amiodarone infusion  Continue amiodarone 400 mg by mouth twice daily in addition to carvedilol 25 mg twice daily. The patient has been on anticoagulation with Xarelto without interruption.  Currently on 15 mg dose given his renal dysfunction.  Given his cardiomyopathy and suboptimal rate control, he benefits from restoring sinus rhythm and thus we have recommended proceeding with  cardioversion tomorrow.  The procedure was discussed with the patient as well as risks and benefits.  3.  CAD/Elevated troponin:   Trop 0.20  0.43 in setting of sepsis and Afib RVR.    ECG non-acute.  Last cath in 06/2016 showed patent RCA/RPDA and LCX stents and otw nonobstructive dzs.  --Consider outpatient stress testing. -- statin.   No asa in setting of xarelto.  4.  Essential HTN/Hypotension:   Diuretics on hold.   Blood pressure stable  5.  HL: LDL 79 in Dec.    statin.  6.  Chronic combined syst/diast CHF:    Diuretics on hold in setting of sepsis/hypotension/acute renal failure.  Cont ? blocker as bp allows. No ACE inhibitor or ARB for now unless renal function improved.  7.  Acute on chronic renal failure No significant improved despite IV fluids Likely underlying renal failure from diabetes with ATN admission in the setting of infection/sepsis  8.  Carotid arterial dzs:  stroke in Dec w/ 80% right carotid stenosis @ the bifurcation.  Pending CEA w/ vascular surgery -  Surgery should be postponed for at least few weeks until he is more optimized.    For questions or updates, please contact [facility] Please consult [email or web address] for contact info under        Signed, Lorine Bears, MD  07/29/2018, 8:26 AM

## 2018-07-29 NOTE — Progress Notes (Signed)
[employer] -  at [facility]   PATIENT NAME: Tizoc Oriley    MR#:  [id number]  DATE OF BIRTH:  12-31-1955  SUBJECTIVE:  CHIEF COMPLAINT:   Chief Complaint  Patient presents with  . Nausea  . Emesis  improving, no complaints, some weakness REVIEW OF SYSTEMS:  Review of Systems  Constitutional: Positive for malaise/fatigue. Negative for diaphoresis, fever and weight loss.  HENT: Negative for ear discharge, ear pain, hearing loss, nosebleeds, sore throat and tinnitus.   Eyes: Negative for blurred vision and pain.  Respiratory: Negative for cough, hemoptysis, shortness of breath and wheezing.   Cardiovascular: Positive for chest pain. Negative for palpitations, orthopnea and leg swelling.  Gastrointestinal: Negative for abdominal pain, blood in stool, constipation, diarrhea, heartburn, nausea and vomiting.  Genitourinary: Negative for dysuria, frequency and urgency.  Musculoskeletal: Negative for back pain and myalgias.  Skin: Negative for itching and rash.  Neurological: Negative for dizziness, tingling, tremors, focal weakness, seizures, weakness and headaches.  Psychiatric/Behavioral: Negative for depression. The patient is not nervous/anxious.    DRUG ALLERGIES:   Allergies  Allergen Reactions  . Aspirin Other (See Comments)    Told not to take because of blood thinner  . Ibuprofen Nausea And Vomiting  . Tylenol [Acetaminophen] Nausea And Vomiting   VITALS:  Blood pressure 104/88, pulse 91, temperature 98 F (36.7 C), temperature source Oral, resp. rate 18, height 6' (1.829 m), weight 108.6 kg, SpO2 98 %. PHYSICAL EXAMINATION:  Physical Exam HENT:     Head: Normocephalic and atraumatic.  Eyes:     Conjunctiva/sclera: Conjunctivae normal.     Pupils: Pupils are equal, round, and reactive to light.  Neck:     Musculoskeletal: Normal range of motion and neck supple.     Thyroid: No thyromegaly.     Trachea: No tracheal deviation.    Cardiovascular:     Rate and Rhythm: Rhythm irregularly irregular.     Heart sounds: Normal heart sounds.  Pulmonary:     Effort: Pulmonary effort is normal. No respiratory distress.     Breath sounds: Normal breath sounds. No wheezing.  Chest:     Chest wall: No tenderness.  Abdominal:     General: Bowel sounds are normal. There is no distension.     Palpations: Abdomen is soft.     Tenderness: There is no abdominal tenderness.  Musculoskeletal: Normal range of motion.  Skin:    General: Skin is warm and dry.     Findings: No rash.  Neurological:     Mental Status: He is alert and oriented to person, place, and time.     Cranial Nerves: No cranial nerve deficit.    LABORATORY PANEL:  Male CBC Recent Labs  Lab 07/28/18 0404  WBC 9.0  HGB 13.0  HCT 41.2  PLT 133*   ------------------------------------------------------------------------------------------------------------------ Chemistries  Recent Labs  Lab 07/26/18 0216  07/29/18 0343  NA 136   < > 134*  K 4.3   < > 3.3*  CL 101   < > 94*  CO2 21*   < > 29  GLUCOSE 168*   < > 101*  BUN 62*   < > 62*  CREATININE 2.30*   < > 2.12*  CALCIUM 8.4*   < > 8.6*  MG  --   --  2.6*  AST 39  --   --   ALT 31  --   --   ALKPHOS 102  --   --  BILITOT 4.7*  --   --    < > = values in this interval not displayed.   RADIOLOGY:  No results found. ASSESSMENT AND PLAN:  . Sepsis secondary to UTI (HCC)/hypothermia/ Lactic acidosis -Blood and urine cultures neg thus far -Suspect sepsis due to UTI based on the stranding seen in bilateral kidneys on the CT abdomen -continue IV Rocephin for now. Can switch to PO tomorrow - EF is 30 to 35%.  . Nausea and vomiting: resolved  . AKI (acute kidney injury) (HCC)/dehydration: can't rule out underlying CKD -Likely due to severe dehydration.  Continue IV fluid hydration.  -holding kdur, metformin, aldactone and demadex -Antiemetics PRN  .  Hyperbilirubinemia/transaminitis -May be due to sepsis  . Atrial fibrillation with RVR (HCC) -Due to sepsis and severe dehydration.   - Continue amiodarone 400 mg by mouth twice daily in addition to carvedilol 25 mg twice daily per cardiology - cardioversion planned for tomorrow. -continue coreg with hold parameters and xarelto  Diabetes -hold metformin, continue SSI  . CAD (coronary artery disease)/chronic systolic heart failure - pleuritic chest pain. Troponin peak to 0.4 likley due to demand ischemia   . COPD (chronic obstructive pulmonary disease) (HCC) -Stable, home meds resumed.  DuoNeb  . HLD (hyperlipidemia) -hold Simvastation  Obstructive sleep apnea -Every hour sleep CPAP ordered     All the records are reviewed and case discussed with Care Management/Social Worker. Management plans discussed with the patient, nursing and they are in agreement.  CODE STATUS: Partial Code  TOTAL TIME TAKING CARE OF THIS PATIENT: 35 minutes.   More than 50% of the time was spent in counseling/coordination of care: YES  POSSIBLE D/C IN 1-2 DAYS, DEPENDING ON CLINICAL CONDITION.  Cardiac intervention   Delfino LovettVipul Aniqua Briere M.D on 07/29/2018 at 4:12 PM  Between 7am to 6pm - Pager - [phone]  After 6pm go to [email or web address] - Scientist, research (life sciences)password EPAS [email or web address]  [facility] [place] [facility]  Office  [phone]  CC: Primary care physician; Administration, Veterans  Note: This dictation was prepared with Nurse, children'sDragon dictation along with smaller Lobbyistphrase technology. Any transcriptional errors that result from this process are unintentional.

## 2018-07-29 NOTE — Clinical Social Work Note (Addendum)
CSW was informed that patient may need SNF placement for short term rehab.  CSW spoke with patient and his wife, and they would rather go home with home health.  Patient's wife said that he was active with [facility], CSW to inform case Production designer, theatre/television/film.  Ervin Knack. Keosha Rossa, MSW, Theresia Majors [phone]  07/29/2018 4:51 PM

## 2018-07-29 NOTE — Progress Notes (Signed)
PHARMACY CONSULT NOTE - FOLLOW UP  Pharmacy Consult for Electrolyte Monitoring and Replacement   Recent Labs: Potassium (mmol/L)  Date Value  07/29/2018 3.3 (L)  12/11/2012 4.2   Magnesium (mg/dL)  Date Value  16/03/9603 2.6 (H)   Calcium (mg/dL)  Date Value  54/02/8118 8.6 (L)   Calcium, Total (mg/dL)  Date Value  14/78/2956 8.8   Albumin (g/dL)  Date Value  21/30/8657 2.8 (L)  12/11/2012 3.3 (L)   Phosphorus (mg/dL)  Date Value  84/69/6295 5.2 (H)   Sodium (mmol/L)  Date Value  07/29/2018 134 (L)  12/21/2012 148 (H)  12/11/2012 141     Assessment: Patient is a 63yo male admitted for urosepsis and afib with RVR. Pharmacy consulted for electrolyte management.  Goal of Therapy:  K ~4.0, Mag ~2.0  Plan:  K 3.3, will supplement with KCl PO times 1 to target a goal K of 4.0. Follow up on AM labs.  Paschal Dopp, PharmD, BCPS 07/29/2018 8:05 AM

## 2018-07-29 NOTE — Care Management Important Message (Signed)
Patient unable to sign initial Medicare IM.  Copy left in room.

## 2018-07-29 NOTE — Progress Notes (Signed)
PT Cancellation Note  Patient Details Name: [name] MRN: [id number] DOB: [birth year]   Cancelled Treatment:    Reason Eval/Treat Not Completed: Other (comment)   Offered and encouraged participation in therapy this pm.  Pt side lying in bed and refusing interventions.  Educated on importance of daily exercises and mobility but he continued to refuse closing his eyes and pulling up blankets.  "Hit me up later."  Pt informed it was 3:00 pm but he remained firm in his refusal.   Danielle Dess 07/29/2018, 2:58 PM

## 2018-07-29 NOTE — NC FL2 (Signed)
[place] MEDICAID FL2 LEVEL OF CARE SCREENING TOOL     IDENTIFICATION  Patient Name: [name] Birthdate: [birth year] Sex: male Admission Date (Current Location): 07/25/2018  [place] and [place] Number:  Chiropodist and Address:  [facility], [address], [place], [place] [address]      Provider Number: [id number]  Attending Physician Name and Address:  Delfino Lovett, MD  Relative Name and Phone Number:  Anselm Lis [phone]     Current Level of Care: Hospital Recommended Level of Care: Skilled Nursing Facility Prior Approval Number:    Date Approved/Denied:   PASRR Number: [id number]  Discharge Plan: SNF    Current Diagnoses: Patient Active Problem List   Diagnosis Date Noted  . HFrEF (heart failure with reduced ejection fraction) (HCC)   . Sepsis secondary to UTI (HCC) 07/25/2018  . Lactic acidosis 07/25/2018  . Nausea and vomiting 07/25/2018  . AKI (acute kidney injury) (HCC) 07/25/2018  . Hyperbilirubinemia 07/25/2018  . Sepsis (HCC) 07/25/2018  . Acute CVA (cerebrovascular accident) (HCC) 06/14/2018  . Slurred speech 06/13/2018  . Carotid stenosis 06/13/2018  . Elevated troponin 06/13/2018  . Atrial fibrillation, chronic 06/13/2018  . Type 2 diabetes mellitus without complication (HCC) 06/13/2018  . TIA (transient ischemic attack) 06/13/2018  . TIA involving carotid artery 06/13/2018  . Acute respiratory failure with hypoxia (HCC) 01/19/2018  . Atrial fibrillation with RVR (HCC) 03/26/2017  . Atrial flutter with rapid ventricular response (HCC) 02/18/2017  . Diastolic CHF, acute on chronic (HCC) 01/06/2017  . CAD in native artery   . Chest pain 01/05/2017  . Ischemic cardiomyopathy 07/26/2016  . Non-sustained ventricular tachycardia (HCC) 07/26/2016  . COPD (chronic obstructive pulmonary disease) (HCC) 07/24/2016  . CAD (coronary artery disease) 07/24/2016  . HLD (hyperlipidemia) 07/24/2016  .  Acute on chronic combined systolic and diastolic CHF (congestive heart failure) (HCC) 07/24/2016  . Acute respiratory distress 07/24/2016  . Tobacco abuse 07/24/2016  . Accelerated hypertension 07/24/2016  . Hypertensive heart disease 07/24/2016    Orientation RESPIRATION BLADDER Height & Weight     Self, Time, Situation, Place  Normal Continent Weight: 239 lb 8 oz (108.6 kg) Height:  6' (182.9 cm)  BEHAVIORAL SYMPTOMS/MOOD NEUROLOGICAL BOWEL NUTRITION STATUS      Continent Diet(Cardiac Carb Modified)  AMBULATORY STATUS COMMUNICATION OF NEEDS Skin   Limited Assist Verbally Normal                       Personal Care Assistance Level of Assistance  Bathing, Feeding, Dressing Bathing Assistance: Limited assistance Feeding assistance: Independent Dressing Assistance: Limited assistance     Functional Limitations Info  Sight, Hearing, Speech Sight Info: Adequate Hearing Info: Adequate Speech Info: Adequate    SPECIAL CARE FACTORS FREQUENCY  PT (By licensed PT), OT (By licensed OT)     PT Frequency: 5x a week OT Frequency: 5x a week            Contractures Contractures Info: Not present    Additional Factors Info  Code Status, Allergies, Insulin Sliding Scale Code Status Info: Partial Allergies Info: ASPIRIN, IBUPROFEN, TYLENOL ACETAMINOPHEN   Insulin Sliding Scale Info: insulin aspart (novoLOG) injection 0-15 Units 3x a day       Current Medications (07/29/2018):  This is the current hospital active medication list Current Facility-Administered Medications  Medication Dose Route Frequency Provider Last Rate Last Dose  . 0.9 %  sodium chloride infusion   Intravenous PRN Tukov-Yual, Magdalene S,  NP 5 mL/hr at 07/26/18 1000    . amiodarone (PACERONE) tablet 400 mg  400 mg Oral BID Antonieta Iba, MD   400 mg at 07/29/18 0817  . atorvastatin (LIPITOR) tablet 40 mg  40 mg Oral q1800 Delfino Lovett, MD      . budesonide (PULMICORT) nebulizer solution 0.5 mg  0.5 mg  Nebulization BID Erin Fulling, MD   0.5 mg at 07/29/18 0850  . carvedilol (COREG) tablet 25 mg  25 mg Oral BID WC Crosley, Debby, MD   25 mg at 07/29/18 0817  . cefTRIAXone (ROCEPHIN) 2 g in sodium chloride 0.9 % 100 mL IVPB  2 g Intravenous Q24H Erin Fulling, MD   Stopped at 07/28/18 2241  . docusate sodium (COLACE) capsule 100 mg  100 mg Oral BID Gery Pray, MD   100 mg at 07/29/18 0817  . famotidine (PEPCID) tablet 20 mg  20 mg Oral Daily Ronnald Ramp, RPH   20 mg at 07/29/18 0945  . guaiFENesin-codeine 100-10 MG/5ML solution 10 mL  10 mL Oral Q4H PRN Tukov-Yual, Magdalene S, NP   10 mL at 07/25/18 2159  . insulin aspart (novoLOG) injection 0-15 Units  0-15 Units Subcutaneous TID WC Gery Pray, MD   3 Units at 07/29/18 1224  . insulin aspart (novoLOG) injection 0-5 Units  0-5 Units Subcutaneous QHS Crosley, Debby, MD      . ipratropium-albuterol (DUONEB) 0.5-2.5 (3) MG/3ML nebulizer solution 3 mL  3 mL Nebulization BID Delfino Lovett, MD   3 mL at 07/29/18 0850  . mometasone-formoterol (DULERA) 200-5 MCG/ACT inhaler 2 puff  2 puff Inhalation BID Gery Pray, MD   2 puff at 07/29/18 0818  . morphine 2 MG/ML injection 2 mg  2 mg Intravenous Q3H PRN Tukov-Yual, Magdalene S, NP   2 mg at 07/28/18 0007  . nitroGLYCERIN (NITROSTAT) SL tablet 0.4 mg  0.4 mg Sublingual Q5 min PRN Crosley, Debby, MD      . ondansetron (ZOFRAN) tablet 4 mg  4 mg Oral Q6H PRN Crosley, Debby, MD       Or  . ondansetron (ZOFRAN) injection 4 mg  4 mg Intravenous Q6H PRN Crosley, Debby, MD      . protein supplement (ENSURE MAX) liquid  11 oz Oral Daily Erin Fulling, MD   11 oz at 07/28/18 2113  . Rivaroxaban (XARELTO) tablet 15 mg  15 mg Oral Q supper Gery Pray, MD   15 mg at 07/28/18 1715  . senna (SENOKOT) tablet 8.6 mg  1 tablet Oral BID Joneen Roach, Debby, MD   8.6 mg at 07/29/18 0817  . traZODone (DESYREL) tablet 50 mg  50 mg Oral QHS Gery Pray, MD   50 mg at 07/28/18 2111     Discharge  Medications: Please see discharge summary for a list of discharge medications.  Relevant Imaging Results:  Relevant Lab Results:   Additional Information SSN [id number]  Darleene Cleaver, [place]

## 2018-07-29 NOTE — Progress Notes (Signed)
This RN received call from CCMD that patient was having runs of VTACH.  EKG obtained and spoke to Marisue Ivan PA from cardiology.  She looked at strips and verified that rhythm was not vtach.  In to check on patient, he was sitting on side of bed.  States that he does get SOB when he lays flat but when he is sitting up, no complaints and O2 sat 98% at this time on RA.  This RN relayed information to CCMD.

## 2018-07-30 ENCOUNTER — Encounter
Admission: EM | Disposition: A | Discharge status: Home Health | Payer: Self-pay | Source: Home / Self Care | Attending: Internal Medicine

## 2018-07-30 ENCOUNTER — Inpatient Hospital Stay: Payer: Medicare Other | Admitting: Anesthesiology

## 2018-07-30 ENCOUNTER — Encounter: Payer: Self-pay | Admitting: Anesthesiology

## 2018-07-30 DIAGNOSIS — I5023 Acute on chronic systolic (congestive) heart failure: Secondary | ICD-10-CM

## 2018-07-30 DIAGNOSIS — I25118 Atherosclerotic heart disease of native coronary artery with other forms of angina pectoris: Secondary | ICD-10-CM

## 2018-07-30 HISTORY — PX: CARDIOVERSION: EP1203

## 2018-07-30 LAB — CBC
HEMATOCRIT: 43 % (ref 39.0–52.0)
Hemoglobin: 13.3 g/dL (ref 13.0–17.0)
MCH: 28.9 pg (ref 26.0–34.0)
MCHC: 30.9 g/dL (ref 30.0–36.0)
MCV: 93.5 fL (ref 80.0–100.0)
NRBC: 0.7 % — AB (ref 0.0–0.2)
Platelets: 158 10*3/uL (ref 150–400)
RBC: 4.6 MIL/uL (ref 4.22–5.81)
RDW: 17.4 % — ABNORMAL HIGH (ref 11.5–15.5)
WBC: 6.8 10*3/uL (ref 4.0–10.5)

## 2018-07-30 LAB — BASIC METABOLIC PANEL
Anion gap: 9 (ref 5–15)
BUN: 50 mg/dL — ABNORMAL HIGH (ref 8–23)
CALCIUM: 9.1 mg/dL (ref 8.9–10.3)
CO2: 32 mmol/L (ref 22–32)
Chloride: 96 mmol/L — ABNORMAL LOW (ref 98–111)
Creatinine, Ser: 1.78 mg/dL — ABNORMAL HIGH (ref 0.61–1.24)
GFR calc Af Amer: 47 mL/min — ABNORMAL LOW (ref >60)
GFR, EST NON AFRICAN AMERICAN: 40 mL/min — AB (ref >60)
GLUCOSE: 124 mg/dL — AB (ref 70–99)
Potassium: 4.2 mmol/L (ref 3.5–5.1)
Sodium: 137 mmol/L (ref 135–145)

## 2018-07-30 LAB — CULTURE, BLOOD (ROUTINE X 2)
Culture: NO GROWTH
Culture: NO GROWTH
Special Requests: ADEQUATE

## 2018-07-30 LAB — GLUCOSE, CAPILLARY
Glucose-Capillary: 112 mg/dL — ABNORMAL HIGH (ref 70–99)
Glucose-Capillary: 210 mg/dL — ABNORMAL HIGH (ref 70–99)
Glucose-Capillary: 137 mg/dL — ABNORMAL HIGH (ref 70–99)
Glucose-Capillary: 147 mg/dL — ABNORMAL HIGH (ref 70–99)

## 2018-07-30 SURGERY — CARDIOVERSION (CATH LAB)
Anesthesia: General

## 2018-07-30 MED ORDER — ALBUMIN HUMAN 25 % IV SOLN
12.5000 g | Freq: Two times a day (BID) | INTRAVENOUS | Status: DC
  Administered 2018-07-30 – 2018-07-31 (×3): 12.5 g via INTRAVENOUS
  Filled 2018-07-30 (×4): qty 50

## 2018-07-30 MED ORDER — RIVAROXABAN 20 MG PO TABS
20.0000 mg | ORAL_TABLET | Freq: Every day | ORAL | Status: DC
  Administered 2018-07-30: 20 mg via ORAL
  Filled 2018-07-30: qty 1

## 2018-07-30 MED ORDER — PROPOFOL 10 MG/ML IV BOLUS
INTRAVENOUS | Status: DC | PRN
  Administered 2018-07-30: 80 mg via INTRAVENOUS

## 2018-07-30 MED ORDER — CARVEDILOL 12.5 MG PO TABS: 6.2500 mg | ORAL_TABLET | Freq: Two times a day (BID) | ORAL | Status: DC

## 2018-07-30 MED ORDER — FUROSEMIDE 10 MG/ML IJ SOLN
8.0000 mg/h | INTRAVENOUS | Status: DC
  Administered 2018-07-30: 8 mg/h via INTRAVENOUS
  Filled 2018-07-30: qty 25

## 2018-07-30 MED ORDER — CARVEDILOL 6.25 MG PO TABS
6.2500 mg | ORAL_TABLET | Freq: Two times a day (BID) | ORAL | Status: DC
  Administered 2018-07-30 – 2018-07-31 (×3): 6.25 mg via ORAL
  Filled 2018-07-30 (×3): qty 1

## 2018-07-30 NOTE — Progress Notes (Signed)
   07/30/18 1045  Clinical Encounter Type  Visited With Patient and family together  Visit Type Follow-up  Ch followed up on completing the AD. Pt appreciated it.

## 2018-07-30 NOTE — Progress Notes (Signed)
[employer] - [place] at [facility]   PATIENT NAME: [name]    MR#:  [id number]  DATE OF BIRTH:  10-15-1955  SUBJECTIVE:  CHIEF COMPLAINT:   Chief Complaint  Patient presents with  . Nausea  . Emesis  got in argument with spouse, he didn't want to go SNF and was hoping to go today. Finally agreed to stay and will likely go once Diuresed in next few days. Denies any symptoms. Some SOB REVIEW OF SYSTEMS:  Review of Systems  Constitutional: Positive for malaise/fatigue. Negative for diaphoresis, fever and weight loss.  HENT: Negative for ear discharge, ear pain, hearing loss, nosebleeds, sore throat and tinnitus.   Eyes: Negative for blurred vision and pain.  Respiratory: Positive for shortness of breath. Negative for cough, hemoptysis and wheezing.   Cardiovascular: Positive for leg swelling. Negative for palpitations and orthopnea.  Gastrointestinal: Negative for abdominal pain, blood in stool, constipation, diarrhea, heartburn, nausea and vomiting.  Genitourinary: Negative for dysuria, frequency and urgency.  Musculoskeletal: Negative for back pain and myalgias.  Skin: Negative for itching and rash.  Neurological: Negative for dizziness, tingling, tremors, focal weakness, seizures, weakness and headaches.  Psychiatric/Behavioral: Negative for depression. The patient is not nervous/anxious.    DRUG ALLERGIES:   Allergies  Allergen Reactions  . Aspirin Other (See Comments)    Told not to take because of blood thinner  . Ibuprofen Nausea And Vomiting  . Tylenol [Acetaminophen] Nausea And Vomiting   VITALS:  Blood pressure 123/81, pulse (!) 53, temperature (!) 97.4 F (36.3 C), temperature source Oral, resp. rate 13, height 6' (1.829 m), weight 108.6 kg, SpO2 100 %. PHYSICAL EXAMINATION:  Physical Exam HENT:     Head: Normocephalic and atraumatic.  Eyes:     Conjunctiva/sclera: Conjunctivae normal.     Pupils: Pupils are equal, round, and reactive  to light.  Neck:     Musculoskeletal: Normal range of motion and neck supple.     Thyroid: No thyromegaly.     Trachea: No tracheal deviation.  Cardiovascular:     Rate and Rhythm: Normal rate and regular rhythm.     Heart sounds: Normal heart sounds.  Pulmonary:     Effort: Pulmonary effort is normal. No respiratory distress.     Breath sounds: Normal breath sounds. No wheezing.  Chest:     Chest wall: No tenderness.  Abdominal:     General: Bowel sounds are normal. There is no distension.     Palpations: Abdomen is soft.     Tenderness: There is no abdominal tenderness.  Musculoskeletal: Normal range of motion.  Skin:    General: Skin is warm and dry.     Findings: No rash.  Neurological:     Mental Status: He is alert and oriented to person, place, and time.     Cranial Nerves: No cranial nerve deficit.    LABORATORY PANEL:  Male CBC Recent Labs  Lab 07/30/18 0543  WBC 6.8  HGB 13.3  HCT 43.0  PLT 158   ------------------------------------------------------------------------------------------------------------------ Chemistries  Recent Labs  Lab 07/26/18 0216  07/29/18 0343 07/30/18 0543  NA 136   < > 134* 137  K 4.3   < > 3.3* 4.2  CL 101   < > 94* 96*  CO2 21*   < > 29 32  GLUCOSE 168*   < > 101* 124*  BUN 62*   < > 62* 50*  CREATININE 2.30*   < > 2.12* 1.78*  CALCIUM 8.4*   < > 8.6* 9.1  MG  --   --  2.6*  --   AST 39  --   --   --   ALT 31  --   --   --   ALKPHOS 102  --   --   --   BILITOT 4.7*  --   --   --    < > = values in this interval not displayed.   RADIOLOGY:  No results found. ASSESSMENT AND PLAN:  . Sepsis secondary to UTI (HCC)/hypothermia/ Lactic acidosis -Blood and urine cultures neg thus far -Suspect sepsis due to UTI based on the stranding seen in bilateral kidneys on the CT abdomen -continue IV Rocephin for now. Can switch to PO Keflex tomorrow - EF is 30 to 35%.  * Anasarca with low albumin -s/p cardioversion today by  cardio -Nephro started IV furosemide infusion with albumin supplementation  . AKI (acute kidney injury) (HCC)/dehydration: could be ATN -Likely due to severe dehydration.  Continue IV fluid hydration.  -holding kdur, metformin, aldactone and demadex -Antiemetics PRN - Nephro c/s - recommends Lasix drip  . Hyperbilirubinemia/transaminitis -May be due to sepsis  . Atrial fibrillation with RVR (HCC): s/p cardioversion today and in in NSR.  -Due to sepsis and severe dehydration.   - Continue amiodarone 400 mg by mouth twice daily. Cut back on Coreg 6.25 po bid -continue xarelto  Diabetes -hold metformin, continue SSI  . CAD (coronary artery disease)/chronic systolic heart failure - pleuritic chest pain. Troponin peak to 0.4 likley due to demand ischemia   . COPD (chronic obstructive pulmonary disease) (HCC) -Stable, home meds resumed.  DuoNeb  . HLD (hyperlipidemia) -hold Simvastation  Obstructive sleep apnea -Every hour sleep CPAP ordered  . Nausea and vomiting: resolved   He is in agreement to stay, but whenever he's ready for D/C - he would like to go home (with home health). I think that's reasonable. Continue Diuresis and try to ambulate daily    All the records are reviewed and case discussed with Care Management/Social Worker. Management plans discussed with the patient, family (wife at bedside in tears), Dr Lewie Loron and Dr Thedore Mins and they are in agreement.  CODE STATUS: Partial Code  TOTAL TIME TAKING CARE OF THIS PATIENT: 35 minutes.   More than 50% of the time was spent in counseling/coordination of care: YES  POSSIBLE D/C IN 1-2 DAYS, DEPENDING ON CLINICAL CONDITION.  Cardiac intervention   Delfino Lovett M.D on 07/30/2018 at 4:08 PM  Between 7am to 6pm - Pager - [phone]  After 6pm go to [email or web address] - Scientist, research (life sciences) [place] [facility]  Office  [phone]  CC: Primary care physician; Administration, Veterans  Note: This  dictation was prepared with Nurse, children's dictation along with smaller Lobbyist. Any transcriptional errors that result from this process are unintentional.

## 2018-07-30 NOTE — Progress Notes (Signed)
PHARMACY CONSULT NOTE - FOLLOW UP  Pharmacy Consult for Electrolyte Monitoring and Replacement   Recent Labs: Potassium (mmol/L)  Date Value  07/30/2018 4.2  12/11/2012 4.2   Magnesium (mg/dL)  Date Value  93/81/0175 2.6 (H)   Calcium (mg/dL)  Date Value  04/23/8526 9.1   Calcium, Total (mg/dL)  Date Value  78/24/2353 8.8   Albumin (g/dL)  Date Value  61/44/3154 2.8 (L)  12/11/2012 3.3 (L)   Phosphorus (mg/dL)  Date Value  00/86/7619 5.2 (H)   Sodium (mmol/L)  Date Value  07/30/2018 137  12/21/2012 148 (H)  12/11/2012 141     Assessment: Patient is a 63yo male admitted for urosepsis and afib with RVR. Pharmacy consulted for electrolyte management.  Goal of Therapy:  K ~4.0, Mag ~2.0  Plan:  K+ wnl - no replacement needed. Follow up with morning labs.   Paschal Dopp, PharmD, BCPS 07/30/2018 11:24 AM

## 2018-07-30 NOTE — Care Management Note (Signed)
Case Management Note  Patient Details  Name: [name] MRN: [id number] Date of Birth: 05-12-1956  Subjective/Objective:      Patient is from home with wife.  He has been admitted with UTI, AKI, CHF, AFRVR.  PT worked with patient on the 28th and recommended SNF.  Was unable to ambulate yesterday.  Patient is wanting to DC to home and wife has concerns and is worried she will not be able to care for him.  Dr. Sherryll BurgerShah to bedside and suggested he stays here a couple of more day for diuresing and monitoring.  Patient is agreeable.  Denies difficulties obtaining medications or accessing healthcare.  Uses a walker and a cane at home.  Does not have a functioning scale.  RNCM provided one for discharge.  Will continue to follow and assist with discharge planning as needed.                Action/Plan:   Expected Discharge Date:  07/27/18               Expected Discharge Plan:     In-House Referral:     Discharge planning Services  CM Consult  Post Acute Care Choice:    Choice offered to:     DME Arranged:    DME Agency:     HH Arranged:    HH Agency:     Status of Service:  In process, will continue to follow  If discussed at Long Length of Stay Meetings, dates discussed:    Additional Comments:  Sherren KernsJennifer L Kimani Hovis, RN 07/30/2018, 10:09 AM

## 2018-07-30 NOTE — Anesthesia Post-op Follow-up Note (Signed)
Anesthesia QCDR form completed.        

## 2018-07-30 NOTE — Anesthesia Postprocedure Evaluation (Signed)
Anesthesia Post Note  Patient: [name]  Procedure(s) Performed: CARDIOVERSION (N/A )  Patient location during evaluation: Cath Lab Anesthesia Type: General Level of consciousness: awake and alert Pain management: pain level controlled Vital Signs Assessment: post-procedure vital signs reviewed and stable Respiratory status: spontaneous breathing, nonlabored ventilation, respiratory function stable and patient connected to nasal cannula oxygen Cardiovascular status: blood pressure returned to baseline and stable Postop Assessment: no apparent nausea or vomiting Anesthetic complications: no     Last Vitals:  Vitals:   07/30/18 0845 07/30/18 0858  BP:  123/81  Pulse: (!) 54 (!) 53  Resp: 13   Temp:  (!) 36.3 C  SpO2: 100% 100%    Last Pain:  Vitals:   07/30/18 0907  TempSrc:   PainSc: 0-No pain                 Lenard Simmer

## 2018-07-30 NOTE — Progress Notes (Signed)
   07/30/18 1000  Clinical Encounter Type  Visited With Patient and family together  Visit Type Initial  Pt requested to fill out an AD. Pt was eating breakfast. Ch will follow up once he is done with eating.

## 2018-07-30 NOTE — CV Procedure (Signed)
Cardioversion procedure note For atrial fibrillation, persistent  Procedure Details:  Consent: Risks of procedure as well as the alternatives and risks of each were explained to the (patient/caregiver). Consent for procedure obtained.  Time Out: Verified patient identification, verified procedure, site/side was marked, verified correct patient position, special equipment/implants available, medications/allergies/relevent history reviewed, required imaging and test results available. Performed  Patient placed on cardiac monitor, pulse oximetry, supplemental oxygen as necessary.  Sedation given: propofol IV, Dr. Karlton Lemon Pacer pads placed anterior and posterior chest.   Cardioverted 1 time(s).  Cardioverted at  200 J. Synchronized biphasic Converted to NSR   Evaluation: Findings: Post procedure EKG shows: NSR Complications: None Patient did tolerate procedure well.  Time Spent Directly with the Patient:  45 minutes   Dossie Arbour, M.D., Ph.D.

## 2018-07-30 NOTE — Clinical Social Work Note (Addendum)
CSW received referral for SNF, CSW spoke with patient and his wife, they do not want to go to SNF.  Case discussed with case manager and plan is to discharge home with home health.  CSW to sign off please re-consult if social work needs arise.  10:00am, CSW was informed by case manager, that patient's wife does not feel comfortable having him return back home yet until he has increased his strengthening.  CSW has faxed patient's information to SNFs awaiting for bed offers.  Ervin Knack. Kaoru Rezendes, MSW, [employer] [phone]

## 2018-07-30 NOTE — Consult Note (Signed)
Date: 07/30/2018                  Patient Name:  [name]  MRN: [id number]  DOB: Sep 03, 1955  Age / Sex: 63 y.o., male         [facility]                 Service Requesting Consult: IM/ Delfino Lovett, MD                 Reason for Consult: AKI, Edema            History of Present Illness: Patient is a 63 y.o. male with medical problems of A Fib, DM, CAD with h/o stent, COPD, OSA, recent stroke, CHF with ef 30-35%, HTN, who was admitted to [facility] on 07/25/2018 for evaluation of cough, SOB.  Baseline Cr 1.08/GFR > 60 from 06/13/2018 Admission creatinine 2.43 on January 26.  Improved to 1.78 today Patient underwent DC cardioversion this morning. Continues to have large amount of lower extremity edema. Neurology consult requested to assist with diuresis  Medications: Outpatient medications: Medications Prior to Admission  Medication Sig Dispense Refill Last Dose  . albuterol-ipratropium (COMBIVENT) 18-103 MCG/ACT inhaler Inhale 2 puffs into the lungs every 4 (four) hours as needed for wheezing.   07/25/2018 at 0400  . atorvastatin (LIPITOR) 40 MG tablet Take 40 mg by mouth daily.    07/25/2018 at 0400  . budesonide-formoterol (SYMBICORT) 160-4.5 MCG/ACT inhaler Inhale 2 puffs into the lungs 2 (two) times daily.   07/25/2018 at 0400  . carvedilol (COREG) 25 MG tablet Take 1 tablet (25 mg total) by mouth 2 (two) times daily with a meal. 60 tablet 0 07/25/2018 at 0400  . metFORMIN (GLUCOPHAGE) 1000 MG tablet Take 1,000 mg by mouth 2 (two) times daily with a meal.    07/25/2018 at 0400  . methocarbamol (ROBAXIN) 500 MG tablet Take 1 tablet (500 mg total) by mouth 4 (four) times daily. 20 tablet 0 07/25/2018 at 0400  . potassium chloride SA (K-DUR,KLOR-CON) 20 MEQ tablet Take 1 tablet (20 mEq total) by mouth daily. (Patient taking differently: Take 40 mEq by mouth daily. ) 10 tablet 0 07/25/2018 at 0400  . rivaroxaban (XARELTO) 20 MG TABS tablet Take 1 tablet (20 mg total) by  mouth daily with supper. 30 tablet 0 07/24/2018 at 1800  . spironolactone (ALDACTONE) 25 MG tablet Take 1 tablet (25 mg total) by mouth daily. 30 tablet 0 07/25/2018 at 0400  . torsemide (DEMADEX) 20 MG tablet Take 2 tablets (40 mg total) by mouth 2 (two) times daily. 60 tablet 0 07/25/2018 at 0400  . traZODone (DESYREL) 50 MG tablet Take 50 mg by mouth at bedtime.   07/24/2018 at 2100  . docusate sodium (COLACE) 100 MG capsule Take 100 mg by mouth 2 (two) times daily.   prn at prn  . nitroGLYCERIN (NITROSTAT) 0.4 MG SL tablet Place 0.4 mg under the tongue every 5 (five) minutes as needed for chest pain.   prn at prn    Current medications: Current Facility-Administered Medications  Medication Dose Route Frequency Provider Last Rate Last Dose  . 0.9 %  sodium chloride infusion   Intravenous PRN Tukov-Yual, Magdalene S, NP 5 mL/hr at 07/26/18 1000    . amiodarone (PACERONE) tablet 400 mg  400 mg Oral BID Antonieta Iba, MD   400 mg at 07/30/18 0911  . atorvastatin (LIPITOR) tablet 40 mg  40 mg Oral q1800 Sherryll Burger,  Vipul, MD   40 mg at 07/29/18 1749  . budesonide (PULMICORT) nebulizer solution 0.5 mg  0.5 mg Nebulization BID Erin FullingKasa, Kurian, MD   0.5 mg at 07/30/18 [id number]  . carvedilol (COREG) tablet 6.25 mg  6.25 mg Oral BID WC Antonieta IbaGollan, Timothy J, MD   6.25 mg at 07/30/18 0911  . cefTRIAXone (ROCEPHIN) 2 g in sodium chloride 0.9 % 100 mL IVPB  2 g Intravenous Q24H Erin FullingKasa, Kurian, MD 200 mL/hr at 07/29/18 2059 2 g at 07/29/18 2059  . docusate sodium (COLACE) capsule 100 mg  100 mg Oral BID Crosley, Debby, MD   100 mg at 07/30/18 0911  . famotidine (PEPCID) tablet 20 mg  20 mg Oral Daily Ronnald Rampatel, Kishan S, RPH   20 mg at 07/30/18 [id number]  . guaiFENesin-codeine 100-10 MG/5ML solution 10 mL  10 mL Oral Q4H PRN Tukov-Yual, Magdalene S, NP   10 mL at 07/25/18 2159  . insulin aspart (novoLOG) injection 0-15 Units  0-15 Units Subcutaneous TID WC Gery Prayrosley, Debby, MD   5 Units at 07/29/18 1749  . insulin aspart (novoLOG)  injection 0-5 Units  0-5 Units Subcutaneous QHS Crosley, Debby, MD      . ipratropium-albuterol (DUONEB) 0.5-2.5 (3) MG/3ML nebulizer solution 3 mL  3 mL Nebulization BID Delfino LovettShah, Vipul, MD   3 mL at 07/30/18 [id number]  . mometasone-formoterol (DULERA) 200-5 MCG/ACT inhaler 2 puff  2 puff Inhalation BID Gery Prayrosley, Debby, MD   2 puff at 07/30/18 0911  . morphine 2 MG/ML injection 2 mg  2 mg Intravenous Q3H PRN Tukov-Yual, Magdalene S, NP   2 mg at 07/28/18 0007  . nitroGLYCERIN (NITROSTAT) SL tablet 0.4 mg  0.4 mg Sublingual Q5 min PRN Gery Prayrosley, Debby, MD   0.4 mg at 07/29/18 2057  . ondansetron (ZOFRAN) tablet 4 mg  4 mg Oral Q6H PRN Crosley, Debby, MD       Or  . ondansetron (ZOFRAN) injection 4 mg  4 mg Intravenous Q6H PRN Crosley, Debby, MD      . protein supplement (ENSURE MAX) liquid  11 oz Oral Daily Erin FullingKasa, Kurian, MD   11 oz at 07/29/18 2047  . Rivaroxaban (XARELTO) tablet 15 mg  15 mg Oral Q supper Gery Prayrosley, Debby, MD   15 mg at 07/29/18 1906  . senna (SENOKOT) tablet 8.6 mg  1 tablet Oral BID Joneen Roachrosley, Debby, MD   8.6 mg at 07/30/18 0911  . traZODone (DESYREL) tablet 50 mg  50 mg Oral QHS Gery Prayrosley, Debby, MD   50 mg at 07/29/18 2047      Allergies: Allergies  Allergen Reactions  . Aspirin Other (See Comments)    Told not to take because of blood thinner  . Ibuprofen Nausea And Vomiting  . Tylenol [Acetaminophen] Nausea And Vomiting      Past Medical History: Past Medical History:  Diagnosis Date  . CAD in native artery    a. stress echo 12/2007 abnl, EF > 55%, b. LHC 01/28/08: mLAD 30, D1 40, dLCx 70, pRCA 30, mRCA 70, mRCA lesion 2 80, PDA 90, s/p PCI/BMS to prox and distal RCA, s/p PCI/BMS to PDA; c. patient reports PCI/st[facility] x 2 in early 2017 at [facility] (no records on file) d. 06/2016: cath showing patent stents along RCA and LCx with moderate 40% stenosis along the LAD.   [name]. Carotid arterial disease (HCC)    a. 05/2018 CTA Head/neck: 80-90% stenosis @ L carotid bifurcation, extending into  the prox LICA. 60% stenosis @ R  carotid bifurcation. 50-75% bilateral distal cavernous carotid dzs.  . Chronic combined systolic (congestive) and diastolic (congestive) heart failure (HCC)    a. 2009 Echo: > 55%; b. 06/2016: EF 25-30%; c. 12/2017 Echo: EF 30-35%, diff HK; d. 05/2018 Echo: 30-35%, mild conc LVH, diff HK. Mild MR. Mildly dil LA/RA.  [name] COPD (chronic obstructive pulmonary disease) (HCC)   . Diabetes mellitus without complication (HCC)   . Gout   . Hyperlipidemia   . Hypertensive heart disease   . Ischemic cardiomyopathy    a. 05/2018: 30-35%.  . Obesity   . Persistent atrial fibrillation 01/2017   a. cardioverted 9/18 to NSR; b. 12/2017 noted to be back in Afib-outpt dccv rec; c. CHA2DS2VASc = 6-->Xarelto.  . Stroke (cerebrum) (HCC)    a. 05/2018 MRI: small patchy acute to subacute cortial and white matter infarct in the bilat cerebrum. Mild chronic small vessel ischmia; b. 05/2018 Carotid U/S: L-carotid bifurcation 80-90% stenosed, R- carotid bifurcation 60% stenosed.  . Tobacco abuse      Past Surgical History: Past Surgical History:  Procedure Laterality Date  . CARDIAC CATHETERIZATION  2009   [facility];   . CARDIAC CATHETERIZATION  2010  . CARDIAC CATHETERIZATION N/A 07/28/2016   Procedure: Right and Left Heart Cath and possible PCI;  Surgeon: Iran Ouch, MD;  Location: [facility] INVASIVE CV LAB;  Service: Cardiovascular;  Laterality: N/A;  . CARDIOVERSION N/A 03/27/2017   Procedure: CARDIOVERSION;  Surgeon: Iran Ouch, MD;  Location: [facility] ORS;  Service: Cardiovascular;  Laterality: N/A;  . CORONARY ANGIOPLASTY  2009   s/p stent [facility] at [facility].     Family History: Family History  Problem Relation Age of Onset  . Heart attack Mother 86  . Hypertension Mother   . Cancer Brother      Social History: Social History   Socioeconomic History  . Marital status: Married    Spouse name: Not on file  . Number of children: Not on file  . Years of education: Not  on file  . Highest education level: Not on file  Occupational History  . Not on file  Social Needs  . Financial resource strain: Not on file  . Food insecurity:    Worry: Not on file    Inability: Not on file  . Transportation needs:    Medical: Not on file    Non-medical: Not on file  Tobacco Use  . Smoking status: Former Smoker    Packs/day: 1.00    Years: 35.00    Pack years: 35.00    Types: Cigarettes  . Smokeless tobacco: Never Used  Substance and Sexual Activity  . Alcohol use: Not Currently    Comment: Quit 8 yrs.  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 Drug use: No  . Sexual activity: Not on file  Lifestyle  . Physical activity:    Days per week: Not on file    Minutes per session: Not on file  . Stress: Not on file  Relationships  . Social connections:    Talks on phone: Not on file    Gets together: Not on file    Attends religious service: Not on file    Active member of club or organization: Not on file    Attends meetings of clubs or organizations: Not on file    Relationship status: Not on file  . Intimate partner violence:    Fear of current or ex partner: Not on file    Emotionally abused: Not on  file    Physically abused: Not on file    Forced sexual activity: Not on file  Other Topics Concern  . Not on file  Social History Narrative   Lives locally with wife.  Does not routinely exercise.     Review of Systems: Gen: Denies any fevers or chills HEENT: No hearing or vision complaints CV: Irregular heartbeat Resp: Some shortness of breath with exertion GI: Appetite is poor.  No nausea, no blood in the stool GU : Able to void without difficulty MS: Generalized weakness Derm:  No complaints Psych: No complaints Heme: No complaints Neuro: No complaints Endocrine.  No complaints  Vital Signs: Blood pressure 123/81, pulse (!) 53, temperature (!) 97.4 F (36.3 C), temperature source Oral, resp. rate 13, height 6' (1.829 m), weight 108.6 kg, SpO2 100  %.   Intake/Output Summary (Last 24 hours) at 07/30/2018 1029 Last data filed at 07/30/2018 0436 Gross per 24 hour  Intake -  Output 1150 ml  Net -1150 ml    Weight trends: Filed Weights   07/28/18 1825 07/29/18 0359 07/30/18 0753  Weight: 110 kg 108.6 kg 108.6 kg    Physical Exam: General:  Lying in the bed, ill-appearing  HEENT  moist oral mucous membranes  Neck:  Supple  Lungs:  Decreased breath sounds at bases, normal effort  Heart::  Regular with ectopic beats  Abdomen:  Soft, distended  Extremities:  2+ pitting edema bilaterally up to lower abdomen  Neurologic:  Alert, able to answer questions appropriately  Skin:  No acute rashes    Lab results: Basic Metabolic Panel: Recent Labs  Lab 07/25/18 2033  07/28/18 0404 07/29/18 0343 07/30/18 0543  NA 135   < > 131* 134* 137  K 4.1   < > 3.5 3.3* 4.2  CL 104   < > 91* 94* 96*  CO2 15*   < > 29 29 32  GLUCOSE 120*   < > 134* 101* 124*  BUN 60*   < > 73* 62* 50*  CREATININE 2.31*   < > 2.39* 2.12* 1.78*  CALCIUM 8.3*   < > 8.5* 8.6* 9.1  MG 2.5*  --   --  2.6*  --   PHOS 5.2*  --   --   --   --    < > = values in this interval not displayed.    Liver Function Tests: Recent Labs  Lab 07/26/18 0216  AST 39  ALT 31  ALKPHOS 102  BILITOT 4.7*  PROT 6.0*  ALBUMIN 2.8*   No results for input(s): LIPASE, AMYLASE in the last 168 hours. Recent Labs  Lab 07/25/18 1717  AMMONIA 21    CBC: Recent Labs  Lab 07/25/18 1559  07/28/18 0404 07/30/18 0543  WBC 18.3*   < > 9.0 6.8  NEUTROABS 15.3*  --   --   --   HGB 17.2*   < > 13.0 13.3  HCT 53.8*   < > 41.2 43.0  MCV 92.0   < > 92.4 93.5  PLT 197   < > 133* 158   < > = values in this interval not displayed.    Cardiac Enzymes: Recent Labs  Lab 07/26/18 1338  TROPONINI 0.37*    BNP: Invalid input(s): POCBNP  CBG: Recent Labs  Lab 07/29/18 0746 07/29/18 1202 07/29/18 1735 07/29/18 2036 07/30/18 0719  GLUCAP 117* 185* 209* 159* 112*     Microbiology: Recent Results (from the past 720 hour(s))  Blood  Culture (routine x 2)     Status: None   Collection Time: 07/25/18  3:59 PM  Result Value Ref Range Status   Specimen Description BLOOD RIGHT ANTECUBITAL  Final   Special Requests   Final    BOTTLES DRAWN AEROBIC AND ANAEROBIC Blood Culture adequate volume   Culture   Final    NO GROWTH 5 DAYS Performed at [facility], [address] [place], [place] [address]    Report Status 07/30/2018 FINAL  Final  Blood Culture (routine x 2)     Status: None   Collection Time: 07/25/18  3:59 PM  Result Value Ref Range Status   Specimen Description BLOOD BLOOD RIGHT WRIST  Final   Special Requests   Final    BOTTLES DRAWN AEROBIC AND ANAEROBIC Blood Culture results may not be optimal due to an inadequate volume of blood received in culture bottles   Culture   Final    NO GROWTH 5 DAYS Performed at [facility], [address], [place], [place] [address]    Report Status 07/30/2018 FINAL  Final  Urine Culture     Status: None   Collection Time: 07/25/18  7:25 PM  Result Value Ref Range Status   Specimen Description   Final    URINE, RANDOM Performed at [facility], [address], [place], [place] [address]    Special Requests   Final    NONE Performed at [facility], [address], [place], [place] [address]    Culture   Final    NO GROWTH Performed at [facility], [address] [place], [place] [address]    Report Status 07/27/2018 FINAL  Final  MRSA PCR Screening     Status: None   Collection Time: 07/25/18  8:44 PM  Result Value Ref Range Status   MRSA by PCR NEGATIVE NEGATIVE Final    Comment:        The GeneXpert MRSA Assay (FDA approved for NASAL specimens only), is one component of a comprehensive MRSA colonization surveillance program. It is not intended to diagnose MRSA infection nor to guide or monitor treatment for MRSA infections. Performed at  [facility], [address] [place], [place] [address]      Coagulation Studies: No results for input(s): LABPROT, INR in the last 72 hours.  Urinalysis: No results for input(s): COLORURINE, LABSPEC, PHURINE, GLUCOSEU, HGBUR, BILIRUBINUR, KETONESUR, PROTEINUR, UROBILINOGEN, NITRITE, LEUKOCYTESUR in the last 72 hours.  Invalid input(s): APPERANCEUR      Imaging:  No results found.   Assessment & Plan: Pt is a 63 y.o. African-American  male with with medical problems of A Fib, DM, CAD with h/o stent, COPD, OSA, recent stroke, CHF with ef 30-35%, HTN, who was admitted to [facility] on 07/25/2018 for evaluation of cough, SOB. Baseline Cr 1.08/GFR > 60 from 06/13/2018  1.  Acute kidney injury -Likely due to abnormal cardiorenal hemodynamics -Patient is nonoliguric, and serum creatinine has started to improve  2.  Anasarca with low albumin -Patient underwent cardioversion today hopefully that improved his cardiac output -Start IV furosemide infusion with albumin supplementation  We will continue to follow      LOS: 5 Toy Samarin 1/31/202010:29 AM  [facility] [place], [place] [phone]  Note: This note was prepared with Dragon dictation. Any transcription errors are unintentional

## 2018-07-30 NOTE — Progress Notes (Signed)
Physical Therapy Treatment Patient Details Name: [name] MRN: [id number] DOB: 06/13/1956 Today's Date: 07/30/2018    History of Present Illness Pt is a 63y/o male who presented with being ill almost 2 weeks. He couldn't hold anything on his stomach. He was not able to eat, everything came up. He was constipated.  He reported being light headed and dizzy for the last 2 weeks with difficulty walking. He reported SOB, coughing but no wheezing. He reported B/L chest pains especially with his cough.  In the ER the patient was hypothermic and tachycardic with heart rates in the 180's.  Assessment includes:  sepsis due to UTI, hypothermia, lactic acidosis, AKI, dehydration, transaminitis, hyperbilirubinemia, A-fib with RVR, DM, CAD, chronic CHF, COPD, and OSA.  PMH includes recent CVA in December 2019 with residual L-sided weakness per pt.  Pt cardioverted on 1/31.      PT Comments    [name] made excellent progress toward mobility goals, ambulating around nurses station with min guard assist for safety.  Updated follow up recommendations to reflect this and SW made aware.  Of note, upon sit>stand at start of session pt reported 9/10 dizziness and was instructed to sit.  BP taken in sitting 86/69.  Pt reclined for several minutes and BP taken again in sitting and standing with improvement and no reports of dizziness so decision was made to ambulate. SpO2 remained at or above 96% on RA throughout session.  Pt will benefit from continued skilled PT services to increase functional independence and safety.    Follow Up Recommendations  Home health PT;Supervision for mobility/OOB     Equipment Recommendations  Rolling walker with 5" wheels    Recommendations for Other Services       Precautions / Restrictions Precautions Precautions: Fall;Other (comment) Precaution Comments: monitor HR Restrictions Weight Bearing Restrictions: No    Mobility  Bed Mobility Overal bed mobility: Needs  Assistance Bed Mobility: Sit to Supine       Sit to supine: Supervision   General bed mobility comments: Supervision for safety.  No assist or cues needed.  Transfers Overall transfer level: Needs assistance Equipment used: Rolling walker (2 wheeled) Transfers: Sit to/from Stand Sit to Stand: Min guard         General transfer comment: Cues for proper hand placement.  Pt reports 9/10 dizziness after standing from chair.  Pt instructed to sit and BP taken: 86/69. Pt reclined in chair, BP improved to 118/84.  Pt then stood again from chair and denied dizziness.  BP taken in standing: 110/83.  Pt stood for 3 minutes and BP taken again: 123/80.   Ambulation/Gait Ambulation/Gait assistance: Min guard Gait Distance (Feet): 160 Feet Assistive device: Rolling walker (2 wheeled) Gait Pattern/deviations: Trunk flexed     General Gait Details: Pt with flexed posture as he pushes RW too far ahead, pt does not correct with verbal cues.  1 standing rest break (unclear for reason but does mention mild SOB).  SpO2 checked at this point and was 98%.     Stairs             Wheelchair Mobility    Modified Rankin (Stroke Patients Only)       Balance Overall balance assessment: Needs assistance Sitting-balance support: No upper extremity supported;Feet supported Sitting balance-Leahy Scale: Good     Standing balance support: Bilateral upper extremity supported;During functional activity Standing balance-Leahy Scale: Poor Standing balance comment: Pt relies on BUE support for static and dynamic activities.  Cognition Arousal/Alertness: Lethargic(pt keeps eyes closed unless participating or talking) Behavior During Therapy: Flat affect Overall Cognitive Status: Within Functional Limits for tasks assessed                                 General Comments: When asked why the pt is keeping his eyes closed the pt says, "I always  try to shut my eyes as much as I can".  Pt denies it's due to dizziness or fatigue in particular.       Exercises General Exercises - Lower Extremity Long Arc Quad: AROM;Strengthening;Both;10 reps;Seated Hip Flexion/Marching: Both;5 reps;Standing    General Comments General comments (skin integrity, edema, etc.): SpO2 remained at or above 96% on RA throughout session.  HR ranging from upper 50s at rest to upper 60s while ambulating. BP 133/87 in supine at end of session.       Pertinent Vitals/Pain Pain Assessment: No/denies pain    Home Living                      Prior Function            PT Goals (current goals can now be found in the care plan section) Acute Rehab PT Goals Patient Stated Goal: to go home PT Goal Formulation: With patient Time For Goal Achievement: 08/09/18 Potential to Achieve Goals: Good Progress towards PT goals: Progressing toward goals    Frequency    Min 2X/week      PT Plan Discharge plan needs to be updated    Co-evaluation              AM-PAC PT "6 Clicks" Mobility   Outcome Measure  Help needed turning from your back to your side while in a flat bed without using bedrails?: None Help needed moving from lying on your back to sitting on the side of a flat bed without using bedrails?: None Help needed moving to and from a bed to a chair (including a wheelchair)?: A Little Help needed standing up from a chair using your arms (e.g., wheelchair or bedside chair)?: A Little Help needed to walk in hospital room?: A Little Help needed climbing 3-5 steps with a railing? : A Little 6 Click Score: 20    End of Session Equipment Utilized During Treatment: Gait belt Activity Tolerance: Patient tolerated treatment well;Other (comment)(pt did mention mild SOB while ambulating) Patient left: in bed;with call bell/phone within reach;with bed alarm set Nurse Communication: Mobility status;Other (comment)(SpO2, BP, dizziness) PT Visit  Diagnosis: Unsteadiness on feet (R26.81);Muscle weakness (generalized) (M62.81);Difficulty in walking, not elsewhere classified (R26.2)     Time: 9758-8325 PT Time Calculation (min) (ACUTE ONLY): 42 min  Charges:  $Gait Training: 8-22 mins $Therapeutic Activity: 23-37 mins                     Encarnacion Chu PT, DPT 07/30/2018, 4:15 PM

## 2018-07-30 NOTE — Transfer of Care (Signed)
Immediate Anesthesia Transfer of Care Note  Patient: [name]  Procedure(s) Performed: CARDIOVERSION (N/A )  Patient Location: Short Stay  Anesthesia Type:General  Level of Consciousness: drowsy  Airway & Oxygen Therapy: Patient connected to nasal cannula oxygen  Post-op Assessment: Post -op Vital signs reviewed and stable  Post vital signs: stable  Last Vitals:  Vitals Value Taken Time  BP 105/71 07/30/2018  8:07 AM  Temp    Pulse 58 07/30/2018  8:07 AM  Resp 15 07/30/2018  8:07 AM  SpO2 100 % 07/30/2018  8:07 AM    Last Pain:  Vitals:   07/30/18 0750  TempSrc: Oral  PainSc:       Patients Stated Pain Goal: 0 (07/29/18 2057)  Complications: No apparent anesthesia complications

## 2018-07-30 NOTE — Anesthesia Preprocedure Evaluation (Addendum)
Anesthesia Evaluation  Patient identified by MRN, date of birth, ID band Patient awake    Reviewed: Allergy & Precautions, NPO status , Patient's Chart, lab work & pertinent test results, reviewed documented beta blocker date and time   History of Anesthesia Complications Negative for: history of anesthetic complications  Airway Mallampati: II  TM Distance: >3 FB     Dental  (+) Dental Advidsory Given, Poor Dentition, Missing   Pulmonary shortness of breath (with the a fib), neg sleep apnea, COPD,  COPD inhaler, Recent URI , former smoker,           Cardiovascular Exercise Tolerance: Poor hypertension, Pt. on medications and Pt. on home beta blockers (-) angina+ CAD and +CHF  (-) Past MI, (-) Cardiac Stents and (-) CABG + dysrhythmias Atrial Fibrillation      Neuro/Psych TIACVA, No Residual Symptoms    GI/Hepatic negative GI ROS, Neg liver ROS,   Endo/Other  diabetes, Well Controlled, Type 2, Oral Hypoglycemic Agents  Renal/GU Renal disease (AKI)     Musculoskeletal negative musculoskeletal ROS (+)   Abdominal Normal abdominal exam  (+)   Peds  Hematology negative hematology ROS (+)   Anesthesia Other Findings Past Medical History: No date: CAD in native artery     Comment:  a. stress echo 12/2007 abnl, EF > 55%, b. LHC 01/28/08:               mLAD 30, D1 40, dLCx 70, pRCA 30, mRCA 70, mRCA lesion 2               80, PDA 90, s/p PCI/BMS to prox and distal RCA, s/p               PCI/BMS to PDA; c. patient reports PCI/stenting x 2 in               early 2017 at the [facility] (no records on file) d. 06/2016:               cath showing patent stents along RCA and LCx with               moderate 40% stenosis along the LAD.  No date: Carotid arterial disease (HCC)     Comment:  a. 05/2018 CTA Head/neck: 80-90% stenosis @ L carotid               bifurcation, extending into the prox LICA. 60% stenosis @              R carotid  bifurcation. 50-75% bilateral distal cavernous               carotid dzs. No date: Chronic combined systolic (congestive) and diastolic  (congestive) heart failure (HCC)     Comment:  a. 2009 Echo: > 55%; b. 06/2016: EF 25-30%; c. 12/2017               Echo: EF 30-35%, diff HK; d. 05/2018 Echo: 30-35%, mild               conc LVH, diff HK. Mild MR. Mildly dil LA/RA. No date: COPD (chronic obstructive pulmonary disease) (HCC) No date: Diabetes mellitus without complication (HCC) No date: Gout No date: Hyperlipidemia No date: Hypertensive heart disease No date: Ischemic cardiomyopathy     Comment:  a. 05/2018: 30-35%. No date: Obesity 01/2017: Persistent atrial fibrillation     Comment:  a. cardioverted 9/18 to NSR; b. 12/2017 noted to be back  in Afib-outpt dccv rec; c. CHA2DS2VASc = 6-->Xarelto. No date: Stroke (cerebrum) [facility])     Comment:  a. 05/2018 MRI: small patchy acute to subacute cortial               and white matter infarct in the bilat cerebrum. Mild               chronic small vessel ischmia; b. 05/2018 Carotid U/S:               L-carotid bifurcation 80-90% stenosed, R- carotid               bifurcation 60% stenosed. No date: Tobacco abuse   Reproductive/Obstetrics                            Anesthesia Physical  Anesthesia Plan  ASA: III  Anesthesia Plan: General   Post-op Pain Management:    Induction: Intravenous  PONV Risk Score and Plan: 2 and Propofol infusion and TIVA  Airway Management Planned: Nasal Cannula and Natural Airway  Additional Equipment:   Intra-op Plan:   Post-operative Plan:   Informed Consent: I have reviewed the patients History and Physical, chart, labs and discussed the procedure including the risks, benefits and alternatives for the proposed anesthesia with the patient or authorized representative who has indicated his/her understanding and acceptance.     Dental advisory given  Plan  Discussed with: CRNA and Surgeon  Anesthesia Plan Comments:         Anesthesia Quick Evaluation

## 2018-07-30 NOTE — Clinical Social Work Note (Signed)
Clinical Social Work Assessment  Patient Details  Name: [name] MRN: [id number] Date of Birth: 1955-12-20  Date of referral:  07/29/18               Reason for consult:  Facility Placement                Permission sought to share information with:  Facility Medical sales representativeContact Representative, Family Supports Permission granted to share information::  Yes, Verbal Permission Granted  Name::     [name],James Spouse [phone]   Agency::  SNF admissions  Relationship::     Contact Information:     Housing/Transportation Living arrangements for the past 2 months:  Single Family Home Source of Information:  Patient, Spouse Patient Interpreter Needed:  None Criminal Activity/Legal Involvement Pertinent to Current Situation/Hospitalization:  No - Comment as needed Significant Relationships:  Spouse Lives with:  Spouse Do you feel safe going back to the place where you live?  No Need for family participation in patient care:  No (Coment)  Care giving concerns:  Patient's wife feels he needs SNF placement for some short term rehab before returning back home.   Social Worker assessment / plan:  Patient is a 63 year old male who is married and lives with his wife.  Patient is alert and oriented x4.  Patient was explained role of CSW and process for looking for SNF placement if he chooses.  Patient stated he does not want to go to SNF and would rather go home with home health.  Patient's wife feels patient may need some sort of rehab first before he is able to return.  Patient was informed how insurance would pay for stay if he decides he wants to go to SNF.  CSW explained process and what to expect at SNF.  CSW also discussed the difference of going home with home health verse going to SNF.  Patient still feels he would rather go home.  CSW encouraged patient to work with therapy next time they see him.  Patient did not have any other questions or concerns.  CSW to follow in case patient changes his  mind.  Employment status:  Disabled (Comment on whether or not currently receiving Disability) Insurance information:  Medicare PT Recommendations:  Skilled Nursing Facility Information / Referral to community resources:  Skilled Nursing Facility  Patient/Family's Response to care:  Patient does not want to go to SNF, but his wife is thinking about having him go to SNF depending on how he does.  Patient/Family's Understanding of and Emotional Response to Diagnosis, Current Treatment, and Prognosis:  Patient and family are hopeful that he is able to improve enough to return home with home health.  Emotional Assessment Appearance:  Appears stated age Attitude/Demeanor/Rapport:    Affect (typically observed):  Appropriate, Stable, Quiet Orientation:  Oriented to Self, Oriented to Place, Oriented to  Time, Oriented to Situation Alcohol / Substance use:  Not Applicable Psych involvement (Current and /or in the community):  No (Comment)  Discharge Needs  Concerns to be addressed:  Care Coordination, Lack of Support Readmission within the last 30 days:  No Current discharge risk:  Lack of support system Barriers to Discharge:  Continued Medical Work up   [place] [employer], Otilia Kareem R, LCSWA 07/30/2018, 4:28 PM

## 2018-07-30 NOTE — Progress Notes (Signed)
Patient will watch at least one EMMI H.F. video by the end of the shift. Will encourage to continue watching. Jari Favre [facility]

## 2018-07-30 NOTE — Progress Notes (Signed)
Progress Note  Patient Name: [name] Date of Encounter: 07/30/2018  Primary Cardiologist:   Subjective   Successful cardioversion this morning Reports having shortness of breath, leg swelling Would like to go home but has not been able to ambulate, Long discussion with wife, she is very concerned about him coming home and her not being able to take care of him  Inpatient Medications    Scheduled Meds: . [MAR Hold] amiodarone  400 mg Oral BID  . [MAR Hold] atorvastatin  40 mg Oral q1800  . [MAR Hold] budesonide (PULMICORT) nebulizer solution  0.5 mg Nebulization BID  . [MAR Hold] carvedilol  25 mg Oral BID WC  . [MAR Hold] docusate sodium  100 mg Oral BID  . [MAR Hold] famotidine  20 mg Oral Daily  . [MAR Hold] insulin aspart  0-15 Units Subcutaneous TID WC  . [MAR Hold] insulin aspart  0-5 Units Subcutaneous QHS  . [MAR Hold] ipratropium-albuterol  3 mL Nebulization BID  . [MAR Hold] mometasone-formoterol  2 puff Inhalation BID  . [MAR Hold] ENSURE MAX PROTEIN  11 oz Oral Daily  . [MAR Hold] Rivaroxaban  15 mg Oral Q supper  . [MAR Hold] senna  1 tablet Oral BID  . [MAR Hold] traZODone  50 mg Oral QHS   Continuous Infusions: . [MAR Hold] sodium chloride 5 mL/hr at 07/26/18 1000  . [MAR Hold] cefTRIAXone (ROCEPHIN)  IV 2 g (07/29/18 2059)   PRN Meds: [MAR Hold] sodium chloride, [MAR Hold] guaiFENesin-codeine, [MAR Hold]  morphine injection, [MAR Hold] nitroGLYCERIN, [MAR Hold] ondansetron **OR** [MAR Hold] ondansetron (ZOFRAN) IV   Vital Signs    Vitals:   07/30/18 0807 07/30/18 0808 07/30/18 0815 07/30/18 0830  BP: 105/71  104/69 119/84  Pulse: (!) 58 (!) 50 (!) 49 (!) 49  Resp: 15 (!) 22 16 14   Temp:      TempSrc:      SpO2: 100% 100% 100% 100%  Weight:      Height:        Intake/Output Summary (Last 24 hours) at 07/30/2018 0842 Last data filed at 07/30/2018 0436 Gross per 24 hour  Intake -  Output 1150 ml  Net -1150 ml   Last 3 Weights 07/30/2018  07/29/2018 07/28/2018  Weight (lbs) 239 lb 8 oz 239 lb 8 oz 242 lb 9.6 oz  Weight (kg) 108.636 kg 108.636 kg 110.043 kg      Telemetry    Atrial fibrillation rate 80-100- Personally Reviewed Now converted to normal sinus rhythm rate 60s  ECG     - Personally Reviewed  Physical Exam   Constitutional:  oriented to person, place, and time. No distress.  HENT:  Head: Grossly normal Eyes:  no discharge. No scleral icterus.  Neck: No JVD, no carotid bruits  Cardiovascular: Regular rate and rhythm, no murmurs appreciated 1-2+ pitting edema to the thighs Pulmonary/Chest: Dullness at the bases Abdominal: Soft.  no distension.  no tenderness.  Musculoskeletal: Normal range of motion Neurological:  normal muscle tone. Coordination normal. No atrophy Skin: Skin warm and dry Psychiatric: normal affect, pleasant   Labs    Chemistry Recent Labs  Lab 07/25/18 1559  07/26/18 0216 07/28/18 0404 07/29/18 0343  NA 133*   < > 136 131* 134*  K 4.3   < > 4.3 3.5 3.3*  CL 94*   < > 101 91* 94*  CO2 23   < > 21* 29 29  GLUCOSE 123*   < > 168*  134* 101*  BUN 59*   < > 62* 73* 62*  CREATININE 2.43*   < > 2.30* 2.39* 2.12*  CALCIUM 9.3   < > 8.4* 8.5* 8.6*  PROT 7.1  --  6.0*  --   --   ALBUMIN 3.4*  --  2.8*  --   --   AST 48*  --  39  --   --   ALT 37  --  31  --   --   ALKPHOS 131*  --  102  --   --   BILITOT 6.5*  --  4.7*  --   --   GFRNONAA 28*   < > 30* 28* 33*  GFRAA 32*   < > 34* 33* 38*  ANIONGAP 16*   < > 14 11 11    < > = values in this interval not displayed.     Hematology Recent Labs  Lab 07/26/18 0216 07/28/18 0404 07/30/18 0543  WBC 13.8* 9.0 6.8  RBC 5.02 4.46 4.60  HGB 14.8 13.0 13.3  HCT 46.4 41.2 43.0  MCV 92.4 92.4 93.5  MCH 29.5 29.1 28.9  MCHC 31.9 31.6 30.9  RDW 17.5* 17.2* 17.4*  PLT 166 133* 158    Cardiac Enzymes Recent Labs  Lab 07/25/18 2033 07/26/18 0216 07/26/18 1338  TROPONINI 0.20* 0.43* 0.37*   No results for input(s): TROPIPOC  in the last 168 hours.   BNPNo results for input(s): BNP, PROBNP in the last 168 hours.   DDimer No results for input(s): DDIMER in the last 168 hours.   Radiology    No results found.  Cardiac Studies  Echocardiogram June 14, 2018 Left ventricle: The cavity size was mildly dilated. There was   mild concentric hypertrophy. Systolic function was moderately to   severely reduced. The estimated ejection fraction was in the   range of 30% to 35%. Diffuse hypokinesis. - Mitral valve: There was mild regurgitation. - Left atrium: The atrium was mildly dilated. - Right atrium: The atrium was mildly dilated. - Pulmonary arteries: Systolic pressure could not be accurately   estimated. - Inferior vena cava: The vessel was dilated. The respirophasic   diameter changes were blunted (< 50%), consistent with elevated   central venous pressure.   Patient Profile     [name] is a 63 y.o. male with a history of CAD s/p prior stenting to the RCA and RPDA, HFrEF w/ EF of 30-35% by echo 05/2018, HTN, HL, DMII, persistent afib/flutter, and carotid arterial dzs s/p recent stroke in 05/2018 - pending R CEA, presenting with shortness of breath, general malaise, Afib rvr and troponin elevation   Assessment & Plan    1.  Sepsis/Pyelonephritis:   Slow improvement in his symptoms. ATN responsible for creatinine 2.4   2.  Afib RVR:   Rates elevated on admission , improved on amiodarone infusion Cardioversion this morning successful in restoring normal sinus rhythm Would continue amiodarone 400 twice daily xarelto 15 daily  3.  CAD/Elevated troponin:   Trop 0.20  0.43 in setting of sepsis and Afib RVR.    Last cath in 06/2016 showed patent RCA/RPDA and LCX stents and otw nonobstructive dzs.  --Consider outpatient stress testing. -- statin.   No asa in setting of xarelto.  4.  Essential HTN/Hypotension:    Blood pressure running low but stable Limited in our room to advance his cardiac  regiment -Now that in normal sinus rhythm will likely need to decrease his carvedilol dose  down.  We will try 6.25 twice daily down from 25 twice daily given bradycardia post cardioversion  5.  HL: LDL 79 in Dec.    statin.  6.  Chronic combined syst/diast CHF:     Cont ? blocker as bp allows. No ACE inhibitor or ARB for now unless renal function improved. We have not been using diuretics given sepsis, hypotension, ATN/acute renal failure -Need to start thinking about low-dose diuretics given fluid overload  7.  Acute on chronic renal failure Likely underlying renal failure from diabetes with ATN in the setting of infection/sepsis --Would probably benefit from nephrology consult to help guide restart of diuretics  8.  Carotid arterial dzs:  stroke in Dec w/ 80% right carotid stenosis @ the bifurcation.  Pending CEA w/ vascular surgery -  Surgery should be postponed for at least few weeks until he is more optimized.  Dispo Long discussion with patient's wife, although she has some nursing background she does not feel she is able to take care of him at home as he is very deconditioned, require significant assistance.  He appears to be refusing skilled nursing for now   Total encounter time more than 25 minutes  Greater than 50% was spent in counseling and coordination of care with the patient   For questions or updates, please contact [facility] Please consult [email or web address] for contact info under        Signed, Julien Nordmannimothy , MD  07/30/2018, 8:42 AM

## 2018-07-30 NOTE — Plan of Care (Signed)
  Problem: Clinical Measurements: Goal: Ability to maintain clinical measurements within normal limits will improve Outcome: Not Progressing Note:  Sodium level today is decreased at only 134. Will continue to monitor lab values. Jari FavreSteven M [facility]

## 2018-07-30 NOTE — Progress Notes (Signed)
Patient off unit for procedure. Will resume care upon return. Vivion Romano M Trina Asch 

## 2018-07-31 DIAGNOSIS — I5021 Acute systolic (congestive) heart failure: Secondary | ICD-10-CM

## 2018-07-31 LAB — CBC
HCT: 42.1 % (ref 39.0–52.0)
Hemoglobin: 13.2 g/dL (ref 13.0–17.0)
MCH: 28.9 pg (ref 26.0–34.0)
MCHC: 31.4 g/dL (ref 30.0–36.0)
MCV: 92.1 fL (ref 80.0–100.0)
Platelets: 156 10*3/uL (ref 150–400)
RBC: 4.57 MIL/uL (ref 4.22–5.81)
RDW: 17.3 % — ABNORMAL HIGH (ref 11.5–15.5)
WBC: 6.5 10*3/uL (ref 4.0–10.5)
nRBC: 0.5 % — ABNORMAL HIGH (ref 0.0–0.2)

## 2018-07-31 LAB — BASIC METABOLIC PANEL
ANION GAP: 9 (ref 5–15)
BUN: 46 mg/dL — ABNORMAL HIGH (ref 8–23)
CO2: 30 mmol/L (ref 22–32)
Calcium: 8.6 mg/dL — ABNORMAL LOW (ref 8.9–10.3)
Chloride: 98 mmol/L (ref 98–111)
Creatinine, Ser: 1.8 mg/dL — ABNORMAL HIGH (ref 0.61–1.24)
GFR calc Af Amer: 46 mL/min — ABNORMAL LOW (ref >60)
GFR calc non Af Amer: 40 mL/min — ABNORMAL LOW (ref >60)
GLUCOSE: 126 mg/dL — AB (ref 70–99)
Potassium: 3.3 mmol/L — ABNORMAL LOW (ref 3.5–5.1)
Sodium: 137 mmol/L (ref 135–145)

## 2018-07-31 LAB — GLUCOSE, CAPILLARY
GLUCOSE-CAPILLARY: 181 mg/dL — AB (ref 70–99)
Glucose-Capillary: 209 mg/dL — ABNORMAL HIGH (ref 70–99)

## 2018-07-31 LAB — MAGNESIUM: Magnesium: 2.2 mg/dL (ref 1.7–2.4)

## 2018-07-31 MED ORDER — ISOSORB DINITRATE-HYDRALAZINE 20-37.5 MG PO TABS: 1.0000 | ORAL_TABLET | Freq: Three times a day (TID) | ORAL | Status: DC

## 2018-07-31 MED ORDER — AMIODARONE HCL 400 MG PO TABS: 400.0000 mg | ORAL_TABLET | Freq: Two times a day (BID) | ORAL | 0 refills | Status: DC

## 2018-07-31 MED ORDER — CARVEDILOL 6.25 MG PO TABS: 6.2500 mg | ORAL_TABLET | Freq: Two times a day (BID) | ORAL | 1 refills | Status: AC

## 2018-07-31 MED ORDER — ISOSORB DINITRATE-HYDRALAZINE 20-37.5 MG PO TABS
1.0000 | ORAL_TABLET | Freq: Two times a day (BID) | ORAL | Status: DC
  Administered 2018-07-31: 1 via ORAL
  Filled 2018-07-31 (×2): qty 1

## 2018-07-31 MED ORDER — POTASSIUM CHLORIDE CRYS ER 20 MEQ PO TBCR
40.0000 meq | EXTENDED_RELEASE_TABLET | Freq: Once | ORAL | Status: AC
  Administered 2018-07-31: 40 meq via ORAL
  Filled 2018-07-31: qty 2

## 2018-07-31 MED ORDER — ISOSORB DINITRATE-HYDRALAZINE 20-37.5 MG PO TABS: 1.0000 | ORAL_TABLET | Freq: Two times a day (BID) | ORAL | 0 refills | Status: DC

## 2018-07-31 MED ORDER — CARVEDILOL 6.25 MG PO TABS: 6.2500 mg | ORAL_TABLET | Freq: Two times a day (BID) | ORAL | 0 refills | Status: DC

## 2018-07-31 NOTE — Clinical Social Work Note (Signed)
CSW received referral for SNF patient did well with PT yesterday, and they have recommended home health now.  Case discussed with case manager and plan is to discharge home with home health.  CSW to sign off please re-consult if social work needs arise.  Ervin Knack. Nolene Rocks, MSW, [employer] [phone]

## 2018-07-31 NOTE — Care Management Note (Signed)
Case Management Note  Patient Details  Name: [name] MRN: [id number] Date of Birth: February 10, 1956  Subjective/Objective:   Patient to be discharged per MD order. Orders in place for home health services. Patient was previously open to [facility] home health. Preference is to remain with them. Notified Cheryl from [facility] of pending discharge. No DME needs. Family to transport.  Buddy Duty RN BSN RNCM [phone]                   Action/Plan:   Expected Discharge Date:  07/31/18               Expected Discharge Plan:     In-House Referral:     Discharge planning Services  CM Consult  Post Acute Care Choice:  Home Health Choice offered to:     DME Arranged:    DME Agency:     HH Arranged:  RN, PT, Nurse's Aide HH Agency:  [facility]  Status of Service:  Completed, signed off  If discussed at Long Length of Stay Meetings, dates discussed:    Additional Comments:  Shannen Flansburg A Chrisma Hurlock, RN 07/31/2018, 11:12 AM

## 2018-07-31 NOTE — Progress Notes (Signed)
Per Dr Desiree Hane patient being discharged home. [facility] Manager will arrange home health per PT recommendation. Will replace Potassium. MD will place prescriptions in chart. After arrangements have been made patient may be discharged.

## 2018-07-31 NOTE — Progress Notes (Signed)
Paged Dr. Desiree Hane regarding patient stating he needs prescriptions for Coreg, Amiodarone and Bidil. Awaiting for discharge. Went over [employer], how to weigh himself each morning and when to call the doctor. Patient has a new scale. No additional concerns and questions. He will schedule follow up appointments on Monday.

## 2018-07-31 NOTE — Progress Notes (Signed)
Progress Note  Patient Name: [name] Date of Encounter: 07/31/2018  Primary Cardiologist:   No primary care provider on file.   Subjective   He denies SOB or pain.    Inpatient Medications    Scheduled Meds: . amiodarone  400 mg Oral BID  . atorvastatin  40 mg Oral q1800  . budesonide (PULMICORT) nebulizer solution  0.5 mg Nebulization BID  . carvedilol  6.25 mg Oral BID WC  . docusate sodium  100 mg Oral BID  . famotidine  20 mg Oral Daily  . insulin aspart  0-15 Units Subcutaneous TID WC  . insulin aspart  0-5 Units Subcutaneous QHS  . ipratropium-albuterol  3 mL Nebulization BID  . mometasone-formoterol  2 puff Inhalation BID  . ENSURE MAX PROTEIN  11 oz Oral Daily  . Rivaroxaban  20 mg Oral Q supper  . senna  1 tablet Oral BID  . traZODone  50 mg Oral QHS   Continuous Infusions: . sodium chloride 5 mL/hr at 07/26/18 1000  . albumin human Stopped (07/30/18 2336)  . cefTRIAXone (ROCEPHIN)  IV Stopped (07/30/18 2238)  . furosemide (LASIX) infusion 8 mg/hr (07/31/18 0409)   PRN Meds: sodium chloride, guaiFENesin-codeine, morphine injection, nitroGLYCERIN, ondansetron **OR** ondansetron (ZOFRAN) IV   Vital Signs    Vitals:   07/30/18 1954 07/30/18 2031 07/31/18 0425 07/31/18 0830  BP:  133/77 136/76 (!) 152/96  Pulse:  61 (!) 53 65  Resp:   17   Temp:   (!) 97.5 F (36.4 C) (!) 97.4 F (36.3 C)  TempSrc:   Oral Oral  SpO2: 96% 96% 99% 100%  Weight:   105 kg   Height:        Intake/Output Summary (Last 24 hours) at 07/31/2018 0908 Last data filed at 07/31/2018 0830 Gross per 24 hour  Intake 1351.67 ml  Output 3400 ml  Net -2048.33 ml   Filed Weights   07/29/18 0359 07/30/18 0753 07/31/18 0425  Weight: 108.6 kg 108.6 kg 105 kg    Telemetry    NSR - Personally Reviewed  ECG    NA - Personally Reviewed  Physical Exam   GEN: No acute distress.   Neck: No  JVD Cardiac: RRR, no murmurs, rubs, or gallops.  Respiratory: Clear  to auscultation  bilaterally. GI: Soft, nontender, non-distended  MS:     Moderately severe edema; No deformity. Neuro:  Nonfocal  Psych: Normal affect   Labs    Chemistry Recent Labs  Lab 07/25/18 1559  07/26/18 0216  07/29/18 0343 07/30/18 0543 07/31/18 0406  NA 133*   < > 136   < > 134* 137 137  K 4.3   < > 4.3   < > 3.3* 4.2 3.3*  CL 94*   < > 101   < > 94* 96* 98  CO2 23   < > 21*   < > 29 32 30  GLUCOSE 123*   < > 168*   < > 101* 124* 126*  BUN 59*   < > 62*   < > 62* 50* 46*  CREATININE 2.43*   < > 2.30*   < > 2.12* 1.78* 1.80*  CALCIUM 9.3   < > 8.4*   < > 8.6* 9.1 8.6*  PROT 7.1  --  6.0*  --   --   --   --   ALBUMIN 3.4*  --  2.8*  --   --   --   --  AST 48*  --  39  --   --   --   --   ALT 37  --  31  --   --   --   --   ALKPHOS 131*  --  102  --   --   --   --   BILITOT 6.5*  --  4.7*  --   --   --   --   GFRNONAA 28*   < > 30*   < > 33* 40* 40*  GFRAA 32*   < > 34*   < > 38* 47* 46*  ANIONGAP 16*   < > 14   < > 11 9 9    < > = values in this interval not displayed.     Hematology Recent Labs  Lab 07/28/18 0404 07/30/18 0543 07/31/18 0406  WBC 9.0 6.8 6.5  RBC 4.46 4.60 4.57  HGB 13.0 13.3 13.2  HCT 41.2 43.0 42.1  MCV 92.4 93.5 92.1  MCH 29.1 28.9 28.9  MCHC 31.6 30.9 31.4  RDW 17.2* 17.4* 17.3*  PLT 133* 158 156    Cardiac Enzymes Recent Labs  Lab 07/25/18 2033 07/26/18 0216 07/26/18 1338  TROPONINI 0.20* 0.43* 0.37*   No results for input(s): TROPIPOC in the last 168 hours.   BNPNo results for input(s): BNP, PROBNP in the last 168 hours.   DDimer No results for input(s): DDIMER in the last 168 hours.   Radiology    No results found.  Cardiac Studies   Echocardiogram June 14, 2018 Left ventricle: The cavity size was mildly dilated. There was mild concentric hypertrophy. Systolic function was moderately to severely reduced. The estimated ejection fraction was in the range of 30% to 35%. Diffuse hypokinesis. - Mitral valve: There was  mild regurgitation. - Left atrium: The atrium was mildly dilated. - Right atrium: The atrium was mildly dilated. - Pulmonary arteries: Systolic pressure could not be accurately estimated. - Inferior vena cava: The vessel was dilated. The respirophasic diameter changes were blunted (<50%), consistent with elevated central venous pressure.   Patient Profile     63 y.o. male with a history of CAD s/p prior stenting to the RCA and RPDA, HFrEF w/ EF of 30-35% by echo 05/2018, HTN, HL, DMII, persistent afib/flutter, and carotid arterial dzs s/p recent stroke in 05/2018 - pending R CEA, presenting with shortness of breath, general malaise, Afib rvr and troponin elevation  Treated for sepsis.    Assessment & Plan     ACUTE SYSTOLIC HF:  EF is as above.   Unable to titrate meds with reduced BP.  However, the BP is now elevated.    Down 1.8 liters yesterday.  Weight is down.  Currently he is off the IV diuretic.  I will start BiDil.  (I would suggest bid to begin with.)      AKI:  Creat is up slightly. .  Follow closely.    ATRIAL FIB WITH RVR:    Status post DCCV.   On amiodarone and Xarelto.   Coreg dose reduced yesterday with his lower heart rate in NSR.  Continue therapy as listed and as above.   ELEVATED TROPONIN:  Conservative therapy.  Medical management.    DYSLIPIDEMIA:  Statin on hold secondary to elevated liver enzymes.      CAROTID STENOSIS:  CEA on hold until he is improved.     I would suggest follow up in the HF clinic and I will send a note  For questions or updates, please contact [facility] Please consult [email or web address] for contact info under Cardiology/STEMI.   Signed, Rollene Rotunda, MD  07/31/2018, 9:08 AM

## 2018-07-31 NOTE — Progress Notes (Signed)
[facility] [place], [place] 07/31/18  Subjective:   Patient has good response to IV Lasix infusion and IV albumin Urine output of 3000 cc last 24 hours Ambulatory in the room.  Asking about going home  Objective:  Vital signs in last 24 hours:  Temp:  [97.4 F (36.3 C)-97.5 F (36.4 C)] 97.4 F (36.3 C) (02/01 0830) Pulse Rate:  [53-65] 65 (02/01 0830) Resp:  [17] 17 (02/01 0425) BP: (133-152)/(76-96) 152/96 (02/01 0830) SpO2:  [96 %-100 %] 100 % (02/01 0830) Weight:  [105 kg] 105 kg (02/01 0425)  Weight change:  Filed Weights   07/29/18 0359 07/30/18 0753 07/31/18 0425  Weight: 108.6 kg 108.6 kg 105 kg    Intake/Output:    Intake/Output Summary (Last 24 hours) at 07/31/2018 1152 Last data filed at 07/31/2018 0830 Gross per 24 hour  Intake 1351.67 ml  Output 3400 ml  Net -2048.33 ml    Physical Exam: General:  Lying in the bed, ill-appearing  HEENT  moist oral mucous membranes  Neck:  Supple  Lungs:  Decreased breath sounds at bases, normal effort  Heart::  Regular with ectopic beats  Abdomen:  Soft, distended  Extremities:  2+ pitting edema bilaterally up to lower abdomen  Neurologic:  Alert, able to answer questions appropriately  Skin:  No acute rashes    Basic Metabolic Panel:  Recent Labs  Lab 07/25/18 2033 07/26/18 0216 07/28/18 0404 07/29/18 0343 07/30/18 0543 07/31/18 0406  NA 135 136 131* 134* 137 137  K 4.1 4.3 3.5 3.3* 4.2 3.3*  CL 104 101 91* 94* 96* 98  CO2 15* 21* 29 29 32 30  GLUCOSE 120* 168* 134* 101* 124* 126*  BUN 60* 62* 73* 62* 50* 46*  CREATININE 2.31* 2.30* 2.39* 2.12* 1.78* 1.80*  CALCIUM 8.3* 8.4* 8.5* 8.6* 9.1 8.6*  MG 2.5*  --   --  2.6*  --  2.2  PHOS 5.2*  --   --   --   --   --      CBC: Recent Labs  Lab 07/25/18 1559 07/26/18 0216 07/28/18 0404 07/30/18 0543 07/31/18 0406  WBC 18.3* 13.8* 9.0 6.8 6.5  NEUTROABS 15.3*  --   --   --   --   HGB 17.2* 14.8 13.0 13.3 13.2  HCT 53.8* 46.4  41.2 43.0 42.1  MCV 92.0 92.4 92.4 93.5 92.1  PLT 197 166 133* 158 156      Lab Results  Component Value Date   HEPBSAG Negative 07/25/2018   HEPBIGM Negative 07/25/2018      Microbiology:  Recent Results (from the past 240 hour(s))  Blood Culture (routine x 2)     Status: None   Collection Time: 07/25/18  3:59 PM  Result Value Ref Range Status   Specimen Description BLOOD RIGHT ANTECUBITAL  Final   Special Requests   Final    BOTTLES DRAWN AEROBIC AND ANAEROBIC Blood Culture adequate volume   Culture   Final    NO GROWTH 5 DAYS Performed at [facility], [address] [place], [place] [address]    Report Status 07/30/2018 FINAL  Final  Blood Culture (routine x 2)     Status: None   Collection Time: 07/25/18  3:59 PM  Result Value Ref Range Status   Specimen Description BLOOD BLOOD RIGHT WRIST  Final   Special Requests   Final    BOTTLES DRAWN AEROBIC AND ANAEROBIC Blood Culture results may not be optimal due  to an inadequate volume of blood received in culture bottles   Culture   Final    NO GROWTH 5 DAYS Performed at [facility], [address] [place] [place], [place] [address]    Report Status 07/30/2018 FINAL  Final  Urine Culture     Status: None   Collection Time: 07/25/18  7:25 PM  Result Value Ref Range Status   Specimen Description   Final    URINE, RANDOM Performed at [facility], [address] [place], [place] [address]    Special Requests   Final    NONE Performed at [facility], [address] [place], [place] [address]    Culture   Final    NO GROWTH Performed at [facility], [address], [place], [place] [address]    Report Status 07/27/2018 FINAL  Final  MRSA PCR Screening     Status: None   Collection Time: 07/25/18  8:44 PM  Result Value Ref Range Status   MRSA by PCR NEGATIVE NEGATIVE Final    Comment:        The GeneXpert MRSA Assay (FDA approved for NASAL specimens only), is one  component of a comprehensive MRSA colonization surveillance program. It is not intended to diagnose MRSA infection nor to guide or monitor treatment for MRSA infections. Performed at [facility], [address] [place], [place] [address]     Coagulation Studies: No results for input(s): LABPROT, INR in the last 72 hours.  Urinalysis: No results for input(s): COLORURINE, LABSPEC, PHURINE, GLUCOSEU, HGBUR, BILIRUBINUR, KETONESUR, PROTEINUR, UROBILINOGEN, NITRITE, LEUKOCYTESUR in the last 72 hours.  Invalid input(s): APPERANCEUR    Imaging: No results found.   Medications:   . sodium chloride 5 mL/hr at 07/26/18 1000  . albumin human 12.5 g (07/31/18 0923)  . cefTRIAXone (ROCEPHIN)  IV Stopped (07/30/18 2238)  . furosemide (LASIX) infusion 8 mg/hr (07/31/18 0409)   . amiodarone  400 mg Oral BID  . atorvastatin  40 mg Oral q1800  . budesonide (PULMICORT) nebulizer solution  0.5 mg Nebulization BID  . carvedilol  6.25 mg Oral BID WC  . docusate sodium  100 mg Oral BID  . famotidine  20 mg Oral Daily  . insulin aspart  0-15 Units Subcutaneous TID WC  . insulin aspart  0-5 Units Subcutaneous QHS  . ipratropium-albuterol  3 mL Nebulization BID  . mometasone-formoterol  2 puff Inhalation BID  . ENSURE MAX PROTEIN  11 oz Oral Daily  . Rivaroxaban  20 mg Oral Q supper  . senna  1 tablet Oral BID  . traZODone  50 mg Oral QHS   sodium chloride, guaiFENesin-codeine, morphine injection, nitroGLYCERIN, ondansetron **OR** ondansetron (ZOFRAN) IV  Assessment/ Plan:  63 y.o. African-American male with with medical problems of A Fib, DM, CAD with h/o stent, COPD, OSA, recent stroke, CHF with ef 30-35%, HTN, who was admitted to [facility] on 07/25/2018 for evaluation of cough, SOB. Baseline Cr 1.08/GFR > 60 from 06/13/2018  1.  Acute kidney injury -Likely due to abnormal cardiorenal hemodynamics -Patient is nonoliguric, and serum creatinine has started to improve -Currently  stabilizing at 1.8/GFR 46  2.  Anasarca with low albumin -Patient underwent cardioversion  -Good response to IV furosemide infusion with albumin supplementation -Patient wants to go home today.  Can consider changing to torsemide 40 mg daily -Outpatient follow-up    LOS: 6 Merced Brougham 2/1/202011:52 AM  [facility] [place], [place] [phone]  Note:  This note was prepared with Dragon dictation. Any transcription errors are unintentional

## 2018-07-31 NOTE — Discharge Summary (Addendum)
[facility] [place] at [facility]   PATIENT NAME: [name]    MR#:  [id number]  DATE OF BIRTH:  13-Aug-1955  DATE OF ADMISSION:  07/25/2018 ADMITTING PHYSICIAN: Gery Pray, MD  DATE OF DISCHARGE: 07/31/2018   PRIMARY CARE PHYSICIAN: Administration, Veterans    ADMISSION DIAGNOSIS:  Hyperbilirubinemia [E80.6] AKI (acute kidney injury) (HCC) [N17.9] Sepsis with acute renal failure without septic shock, due to unspecified organism, unspecified acute renal failure type (HCC) [A41.9, R65.20, N17.9]  DISCHARGE DIAGNOSIS:  Principal Problem:   Sepsis secondary to UTI [facility]) Active Problems:   COPD (chronic obstructive pulmonary disease) (HCC)   CAD (coronary artery disease)   HLD (hyperlipidemia)   Tobacco abuse   Atrial fibrillation with RVR (HCC)   Type 2 diabetes mellitus without complication (HCC)   Lactic acidosis   Nausea and vomiting   AKI (acute kidney injury) (HCC)   Hyperbilirubinemia   Sepsis (HCC)   TIA involving carotid artery   HFrEF (heart failure with reduced ejection fraction) (HCC)   SECONDARY DIAGNOSIS:   Past Medical History:  Diagnosis Date  . CAD in native artery    a. stress echo 12/2007 abnl, EF > 55%, b. LHC 01/28/08: mLAD 30, D1 40, dLCx 70, pRCA 30, mRCA 70, mRCA lesion 2 80, PDA 90, s/p PCI/BMS to prox and distal RCA, s/p PCI/BMS to PDA; c. patient reports PCI/stenting x 2 in early 2017 at the [place] (no records on file) d. 06/2016: cath showing patent stents along RCA and LCx with moderate 40% stenosis along the LAD.   [name] Carotid arterial disease (HCC)    a. 05/2018 CTA Head/neck: 80-90% stenosis @ L carotid bifurcation, extending into the prox LICA. 60% stenosis @ R carotid bifurcation. 50-75% bilateral distal cavernous carotid dzs.  . Chronic combined systolic (congestive) and diastolic (congestive) heart failure (HCC)    a. 2009 Echo: > 55%; b. 06/2016: EF 25-30%; c. 12/2017 Echo: EF 30-35%, diff HK; d. 05/2018 Echo:  30-35%, mild conc LVH, diff HK. Mild MR. Mildly dil LA/RA.  [name] COPD (chronic obstructive pulmonary disease) (HCC)   . Diabetes mellitus without complication (HCC)   . Gout   . Hyperlipidemia   . Hypertensive heart disease   . Ischemic cardiomyopathy    a. 05/2018: 30-35%.  . Obesity   . Persistent atrial fibrillation 01/2017   a. cardioverted 9/18 to NSR; b. 12/2017 noted to be back in Afib-outpt dccv rec; c. CHA2DS2VASc = 6-->Xarelto.  . Stroke (cerebrum) (HCC)    a. 05/2018 MRI: small patchy acute to subacute cortial and white matter infarct in the bilat cerebrum. Mild chronic small vessel ischmia; b. 05/2018 Carotid U/S: L-carotid bifurcation 80-90% stenosed, R- carotid bifurcation 60% stenosed.  . Tobacco abuse     HOSPITAL COURSE:   . Sepsis secondary to UTI (HCC)/hypothermia/Lactic acidosis -Blood and urine cultures neg thus far -Suspectsepsisdue to UTI based on the stranding seen in bilateral kidneys on the CT abdomen -continue IV Rocephin for now. Given > 5 days Abx, no need on discharge. - EF is 30 to 35%.  * Anasarca with low albumin -s/p cardioversion today by cardio -Nephro started IV furosemide infusion with albumin supplementation Had > 3 ltr diuresis, feels much better.  [name] AKI (acute kidney injury) (HCC)/dehydration: could be ATN -Likely due to severe dehydration. Continue IV fluid hydration.  -holding kdur, metformin, aldactone and demadex -Antiemetics PRN - Nephro c/s - recommends Lasix drip - Renal func stable- creatinine around 1.8  . Hyperbilirubinemia/transaminitis -  May be due to sepsis  . Atrial fibrillation with RVR (HCC): s/p cardioversion 07/30/18 and in in NSR.  -Due to sepsis and severe dehydration.  - Continue amiodarone 400 mg by mouth twice daily. Cut back on Coreg 6.25 po bid -continue xarelto  Diabetes -hold metformin, continue SSI  . CAD (coronary artery disease)/chronic systolic heart failure - pleuritic chest pain. Troponin  peak to 0.4 likley due to demand ischemia  . COPD (chronic obstructive pulmonary disease) (HCC) -Stable, home meds resumed. DuoNeb  . HLD (hyperlipidemia) -hold Simvastation  Obstructive sleep apnea -Every hour sleep CPAP ordered  . Nausea and vomiting: resolved  Added Isosorbide and hydralazine as per cardiology recommendation  DISCHARGE CONDITIONS:   Stable.  CONSULTS OBTAINED:  Treatment Team:  Antonieta Iba, MD  DRUG ALLERGIES:   Allergies  Allergen Reactions  . Aspirin Other (See Comments)    Told not to take because of blood thinner  . Ibuprofen Nausea And Vomiting  . Tylenol [Acetaminophen] Nausea And Vomiting    DISCHARGE MEDICATIONS:   Allergies as of 07/31/2018      Reactions   Aspirin Other (See Comments)   Told not to take because of blood thinner   Ibuprofen Nausea And Vomiting   Tylenol [acetaminophen] Nausea And Vomiting      Medication List    STOP taking these medications   metFORMIN 1000 MG tablet Commonly known as:  GLUCOPHAGE   methocarbamol 500 MG tablet Commonly known as:  ROBAXIN     TAKE these medications   amiodarone 400 MG tablet Commonly known as:  PACERONE Take 1 tablet (400 mg total) by mouth 2 (two) times daily.   atorvastatin 40 MG tablet Commonly known as:  LIPITOR Take 40 mg by mouth daily.   budesonide-formoterol 160-4.5 MCG/ACT inhaler Commonly known as:  SYMBICORT Inhale 2 puffs into the lungs 2 (two) times daily.   carvedilol 6.25 MG tablet Commonly known as:  COREG Take 1 tablet (6.25 mg total) by mouth 2 (two) times daily with a meal. What changed:    medication strength  how much to take   COMBIVENT 18-103 MCG/ACT inhaler Generic drug:  albuterol-ipratropium Inhale 2 puffs into the lungs every 4 (four) hours as needed for wheezing.   docusate sodium 100 MG capsule Commonly known as:  COLACE Take 100 mg by mouth 2 (two) times daily.   nitroGLYCERIN 0.4 MG SL tablet Commonly known as:   NITROSTAT Place 0.4 mg under the tongue every 5 (five) minutes as needed for chest pain.   potassium chloride SA 20 MEQ tablet Commonly known as:  K-DUR,KLOR-CON Take 1 tablet (20 mEq total) by mouth daily. What changed:  how much to take   rivaroxaban 20 MG Tabs tablet Commonly known as:  XARELTO Take 1 tablet (20 mg total) by mouth daily with supper.   spironolactone 25 MG tablet Commonly known as:  ALDACTONE Take 1 tablet (25 mg total) by mouth daily.   torsemide 20 MG tablet Commonly known as:  DEMADEX Take 2 tablets (40 mg total) by mouth 2 (two) times daily.   traZODone 50 MG tablet Commonly known as:  DESYREL Take 50 mg by mouth at bedtime.        DISCHARGE INSTRUCTIONS:    Follow with cardiology clinic.  If you experience worsening of your admission symptoms, develop shortness of breath, life threatening emergency, suicidal or homicidal thoughts you must seek medical attention immediately by calling 911 or calling your MD immediately  if symptoms  less severe.  You Must read complete instructions/literature along with all the possible adverse reactions/side effects for all the Medicines you take and that have been prescribed to you. Take any new Medicines after you have completely understood and accept all the possible adverse reactions/side effects.   Please note  You were cared for by a hospitalist during your hospital stay. If you have any questions about your discharge medications or the care you received while you were in the hospital after you are discharged, you can call the unit and asked to speak with the hospitalist on call if the hospitalist that took care of you is not available. Once you are discharged, your primary care physician will handle any further medical issues. Please note that NO REFILLS for any discharge medications will be authorized once you are discharged, as it is imperative that you return to your primary care physician (or establish a  relationship with a primary care physician if you do not have one) for your aftercare needs so that they can reassess your need for medications and monitor your lab values.    Today   CHIEF COMPLAINT:   Chief Complaint  Patient presents with  . Nausea  . Emesis    HISTORY OF PRESENT ILLNESS:  [name]  is a 63 y.o. male presents with of being ill almost 2 weeks. He can't hold anything on his stomach. He's not been able to eat, everything comes up. He's constipated. He has been urinating. he denies any BOU. His urine is not foul smelling. He denies any confusion.  He reports a mucous in his chest. He denies any fevers or chills. He reports being light headed and dizzy for the last 2 weeks. He has difficulty walking this. He reports SOB, coughing but no wheezing. He reports B/L chest pains especially with his cough. He also reports heart palpitations. He states he has continued to take his medications. He has not been to see and doctor or to the UC. He finally came to the ER because he had no choice. He was not feeling better only worse. He was very weak  In the ER the patient was hypothermic and tachycardic,. Heart rates iun the 180's. His creatinine was 2.8, WBC 18, Lactic Acid of 3.3 with elevated LFt';s  History provided by the patient  VITAL SIGNS:  Blood pressure (!) 152/96, pulse 65, temperature (!) 97.4 F (36.3 C), temperature source Oral, resp. rate 17, height 6' (1.829 m), weight 105 kg, SpO2 100 %.  I/O:    Intake/Output Summary (Last 24 hours) at 07/31/2018 1107 Last data filed at 07/31/2018 0830 Gross per 24 hour  Intake 1351.67 ml  Output 3400 ml  Net -2048.33 ml    PHYSICAL EXAMINATION:  GENERAL:  63 y.o.-year-old patient lying in the bed with no acute distress.  EYES: Pupils equal, round, reactive to light and accommodation. No scleral icterus. Extraocular muscles intact.  HEENT: Head atraumatic, normocephalic. Oropharynx and nasopharynx clear.  NECK:   Supple, no jugular venous distention. No thyroid enlargement, no tenderness.  LUNGS: Normal breath sounds bilaterally, no wheezing, rales,rhonchi or crepitation. No use of accessory muscles of respiration.  CARDIOVASCULAR: S1, S2 normal. No murmurs, rubs, or gallops.  ABDOMEN: Soft, non-tender, non-distended. Bowel sounds present. No organomegaly or mass.  EXTREMITIES: some pedal edema, no cyanosis, or clubbing.  NEUROLOGIC: Cranial nerves II through XII are intact. Muscle strength 5/5 in all extremities. Sensation intact. Gait not checked.  PSYCHIATRIC: The patient is alert and oriented  x 3.  SKIN: No obvious rash, lesion, or ulcer.   DATA REVIEW:   CBC Recent Labs  Lab 07/31/18 0406  WBC 6.5  HGB 13.2  HCT 42.1  PLT 156    Chemistries  Recent Labs  Lab 07/26/18 0216  07/31/18 0406  NA 136   < > 137  K 4.3   < > 3.3*  CL 101   < > 98  CO2 21*   < > 30  GLUCOSE 168*   < > 126*  BUN 62*   < > 46*  CREATININE 2.30*   < > 1.80*  CALCIUM 8.4*   < > 8.6*  MG  --    < > 2.2  AST 39  --   --   ALT 31  --   --   ALKPHOS 102  --   --   BILITOT 4.7*  --   --    < > = values in this interval not displayed.    Cardiac Enzymes Recent Labs  Lab 07/26/18 1338  TROPONINI 0.37*    Microbiology Results  Results for orders placed or performed during the hospital encounter of 07/25/18  Blood Culture (routine x 2)     Status: None   Collection Time: 07/25/18  3:59 PM  Result Value Ref Range Status   Specimen Description BLOOD RIGHT ANTECUBITAL  Final   Special Requests   Final    BOTTLES DRAWN AEROBIC AND ANAEROBIC Blood Culture adequate volume   Culture   Final    NO GROWTH 5 DAYS Performed at [facility], [address] [place], [place] [address]    Report Status 07/30/2018 FINAL  Final  Blood Culture (routine x 2)     Status: None   Collection Time: 07/25/18  3:59 PM  Result Value Ref Range Status   Specimen Description BLOOD BLOOD RIGHT WRIST  Final    Special Requests   Final    BOTTLES DRAWN AEROBIC AND ANAEROBIC Blood Culture results may not be optimal due to an inadequate volume of blood received in culture bottles   Culture   Final    NO GROWTH 5 DAYS Performed at [facility], [address] [place], [place] [address]    Report Status 07/30/2018 FINAL  Final  Urine Culture     Status: None   Collection Time: 07/25/18  7:25 PM  Result Value Ref Range Status   Specimen Description   Final    URINE, RANDOM Performed at [facility], [address] [place], [place] [address]    Special Requests   Final    NONE Performed at [facility], [address] [place], [place] [address]    Culture   Final    NO GROWTH Performed at [facility], [address] [place], [place] [address]    Report Status 07/27/2018 FINAL  Final  MRSA PCR Screening     Status: None   Collection Time: 07/25/18  8:44 PM  Result Value Ref Range Status   MRSA by PCR NEGATIVE NEGATIVE Final    Comment:        The GeneXpert MRSA Assay (FDA approved for NASAL specimens only), is one component of a comprehensive MRSA colonization surveillance program. It is not intended to diagnose MRSA infection nor to guide or monitor treatment for MRSA infections. Performed at [facility], [address] [place], [place] [address]     RADIOLOGY:  No results found.  EKG:   Orders placed or performed during the hospital encounter of 07/25/18  . EKG 12-Lead  . EKG 12-Lead  . ED EKG 12-Lead  . ED EKG 12-Lead  . EKG 12-Lead  . EKG 12-Lead  . EKG 12-Lead  . EKG 12-Lead  . EKG 12-Lead  . EKG 12-Lead      Management plans discussed with the patient, family and they are in agreement.  CODE STATUS:     Code Status Orders  (From admission, onward)         Start     Ordered   07/26/18 1208  Limited resuscitation (code)  Continuous    Question Answer Comment  In the event of cardiac or respiratory  ARREST: Initiate Code Blue, Call Rapid Response Yes   In the event of cardiac or respiratory ARREST: Perform CPR Yes   In the event of cardiac or respiratory ARREST: Perform Intubation/Mechanical Ventilation No   In the event of cardiac or respiratory ARREST: Use NIPPV/BiPAp only if indicated Yes   In the event of cardiac or respiratory ARREST: Administer ACLS medications if indicated Yes   In the event of cardiac or respiratory ARREST: Perform Defibrillation or Cardioversion if indicated Yes      07/26/18 1207        Code Status History    Date Active Date Inactive Code Status Order ID Comments User Context   07/25/2018 2020 07/26/2018 1207 Full Code [id number]  Gery Pray, MD Inpatient   06/13/2018 2049 06/15/2018 1706 Full Code [id number]  Gery Pray, MD Inpatient   01/19/2018 2358 01/21/2018 1343 Full Code [id number]  Cammy Copa, MD ED   03/26/2017 0636 03/27/2017 1729 Full Code [id number]  Arnaldo Natal, MD Inpatient   02/18/2017 2112 02/19/2017 1707 Full Code [id number]  Houston Siren, MD Inpatient   01/05/2017 2246 01/07/2017 1614 Full Code [id number]  Altamese Dilling, MD Inpatient   07/24/2016 0235 07/31/2016 1401 Full Code [id number]  Oralia Manis, MD Inpatient      TOTAL TIME TAKING CARE OF THIS PATIENT: 45 minutes.    Altamese Dilling M.D on 07/31/2018 at 11:07 AM  Between 7am to 6pm - Pager - [phone]  After 6pm go to [email or web address] - Social research officer, government  [facility]  Office  [phone]  CC: Primary care physician; Administration, Veterans   Note: This dictation was prepared with Nurse, children's dictation along with smaller Lobbyist. Any transcriptional errors that result from this process are unintentional.

## 2018-07-31 NOTE — Progress Notes (Signed)
PHARMACY CONSULT NOTE - FOLLOW UP  Pharmacy Consult for Electrolyte Monitoring and Replacement   Recent Labs: Potassium (mmol/L)  Date Value  07/31/2018 3.3 (L)  12/11/2012 4.2   Magnesium (mg/dL)  Date Value  12/28/1599 2.2   Calcium (mg/dL)  Date Value  09/32/3557 8.6 (L)   Calcium, Total (mg/dL)  Date Value  32/20/2542 8.8   Albumin (g/dL)  Date Value  70/62/3762 2.8 (L)  12/11/2012 3.3 (L)   Phosphorus (mg/dL)  Date Value  83/15/1761 5.2 (H)   Sodium (mmol/L)  Date Value  07/31/2018 137  12/21/2012 148 (H)  12/11/2012 141     Assessment: Patient is a 63yo male admitted for urosepsis, AKI, and afib with RVR. Pharmacy consulted for electrolyte management.  Pt currently on lasix drip started on 1/31.   Goal of Therapy:  K ~4.0, Mag ~2.0  Plan:  K+ 3.3, Mag 2.2 - Will order KCl 40 mEq PO x1 as pt admitted with AKI and SCr still elevated above baseline  Will order BMET for AM and supplement as needed.   Crist Fat, PharmD, BCPS Clinical Pharmacist 07/31/2018 9:37 AM

## 2018-07-31 NOTE — Progress Notes (Signed)
Per Dr Desiree Hane stopping lasix. Patient being discharged home.

## 2018-08-04 ENCOUNTER — Telehealth: Payer: Self-pay

## 2018-08-04 NOTE — Telephone Encounter (Signed)
-----   Message from Joline Maxcy sent at 08/02/2018  9:27 AM EST ----- Regarding: fu appointment Lmov to schedule appt  ----- Message ----- From: Oneida Arenas Sent: 08/02/2018   7:44 AM EST To: [facility] Scheduling  Can we please schedule w/in 7 days per Dr. Antoine Poche.  Thanks, ----- Message ----- From: Rollene Rotunda, MD Sent: 07/31/2018  12:45 PM EST To: Oneida Arenas  Can you get this guy an appt in our office or the HF clinic in 7 days?  Thanks.

## 2018-08-04 NOTE — Telephone Encounter (Signed)
Offerred 2/10 appt but declined due to another appt and needing later for transportation .   Scheduled 2/11 with [name]

## 2018-08-04 NOTE — Telephone Encounter (Signed)
No answer. Left message to call back.   

## 2018-08-05 NOTE — Telephone Encounter (Signed)
I attempted to call the patient. 1st attempt someone answered the phone and said hello, then hung up. I tried to call back and it went to voice mail. I left a message for the patient to please call back.

## 2018-08-09 ENCOUNTER — Ambulatory Visit: Payer: Self-pay | Admitting: Physician Assistant

## 2018-08-09 NOTE — Telephone Encounter (Signed)
The patient is scheduled to follow up with Eula Listen, PA on 08/10/18.

## 2018-08-09 NOTE — Progress Notes (Signed)
Cardiology Office Note Date:  08/10/2018  Patient ID:  Kaeson, Eary December 27, 1955, MRN [id number] PCP:  [facility]  Cardiologist: [facility]    Chief Complaint: Hospital follow-up  History of Present Illness: [name] is a 63 y.o. male with history of CAD status post prior stenting to the RCA and rPDA, HFrEF with an EF of 30 to 35% by echo in 05/2018 due to ICM, persistent A. 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 fib with RVR.  He has previously been followed by cardiology at the [facility].  His cardiac history dates back to at least 2008 with an abnormal stress test at that time.  This was followed by cardiac cath with PCI to the distal RCA and rPDA in 2009.  He reportedly had repeat stenting and 2017 and was hospitalized in 06/2016 with CHF and a new finding of LV dysfunction with an EF of 25 to 30%.  Cath at that time showed a patent RCA/rPDA and LCx stents.  He was medically managed.  In 02/2017, he was admitted with A. fib/flutter with RVR and underwent successful cardioversion.  He was again admitted in 12/2017 with recurrent A. fib/flutter and CHF exacerbation.  He was rate controlled and maintained on Xarelto with a plan for outpatient cardioversion.  Patient followed up with the [facility] and it was unclear if he ever underwent cardioversion.  He was admitted in 05/2018 with slurred speech and facial droop.  MRI showed bilateral cerebral stroke and CTA of the head/neck showed severe right carotid artery disease.  He was seen by vascular surgery with plans for outpatient right-sided CEA, however this was  delayed in the setting of seeking cardiology clearance from his outpatient cardiologist.  He was most recently admitted to the hospital on 07/25/2018 with sepsis secondary to pyelonephritis, CHF exacerbation, A. fib with RVR, and AKI.  He was loaded with amiodarone and underwent successful cardioversion on 07/30/2018 with subsequent notation of sinus bradycardia leading to tapering of Coreg.  His Xarelto was renally dosed given his AKI.  Troponin peaked at 0.43 and was felt to be demand ischemia in the setting of sepsis and A. fib with RVR with AKI.  Outpatient stress testing was to be considered.  Documented discharge weight of 105 kg.  Discharge labs: Serum creatinine 1.8 (baseline 1-1.2), potassium 3.3, magnesium 2.2, Hgb 13.2, AST/ALT normalized, T bili 4.7 improved from 6.5  He comes in accompanied by his wife today and is doing quite well from a cardiac perspective.  His weight has continued to decrease following his hospital discharge with a most recent weight this morning of 214 pounds.  He has not noted any significant weight gain.  He is not adding salt to his food and is drinking less than 2 L of fluids on a daily basis.  He reports compliance with all medications.  No falls, BRBPR, or melena.  No chest pain, shortness of breath, palpitations, dizziness, presyncope, or syncope.  His lower extremity swelling is much improved.  He denies any abdominal distention, orthopnea, PND, or early satiety.  He has follow-up [facility] nephrology on 08/11/2018 [facility]  They report  [facility] would like a chest x-ray performed prior to his visit with them and ask if we can do this today.  Past Medical History:  Diagnosis Date  . CAD in native artery    a. stress echo 12/2007 abnl, EF > 55%, b. LHC 01/28/08: mLAD 30, D1 40, dLCx 70, pRCA 30, mRCA 70, mRCA lesion 2 80, PDA 90, s/p PCI/BMS to prox and distal RCA, s/p PCI/BMS to PDA; c. patient reports PCI/stenting x 2 in  early 2017 [facility] (no records on file) d. 06/2016: cath showing patent stents along RCA and LCx with moderate 40% stenosis along the LAD.   [name]. Carotid arterial disease (HCC)    a. 05/2018 CTA Head/neck: 80-90% stenosis @ L carotid bifurcation, extending into the prox LICA. 60% stenosis @ R carotid bifurcation. 50-75% bilateral distal cavernous carotid dzs.  . Chronic combined systolic (congestive) and diastolic (congestive) heart failure (HCC)    a. 2009 Echo: > 55%; b. 06/2016: EF 25-30%; c. 12/2017 Echo: EF 30-35%, diff HK; d. 05/2018 Echo: 30-35%, mild conc LVH, diff HK. Mild MR. Mildly dil LA/RA.  [name]. COPD (chronic obstructive pulmonary disease) (HCC)   . Diabetes mellitus without complication (HCC)   . Gout   . Hyperlipidemia   . Hypertensive heart disease   . Ischemic cardiomyopathy    a. 05/2018: 30-35%.  . Obesity   . Persistent atrial fibrillation 01/2017   a. cardioverted 9/18 to NSR; b. 12/2017 noted to be back in Afib-outpt dccv rec; c. CHA2DS2VASc = 6-->Xarelto.  . Stroke (cerebrum) (HCC)    a. 05/2018 MRI: small patchy acute to subacute cortial and white matter infarct in the bilat cerebrum. Mild chronic small vessel ischmia; b. 05/2018 Carotid U/S: L-carotid bifurcation 80-90% stenosed, R- carotid bifurcation 60% stenosed.  . Tobacco abuse     Past Surgical History:  Procedure Laterality Date  . CARDIAC CATHETERIZATION  2009   [facility];   . CARDIAC CATHETERIZATION  2010  . CARDIAC CATHETERIZATION N/A 07/28/2016   Procedure: Right and Left Heart Cath and possible PCI;  Surgeon: Iran OuchMuhammad A Arida, MD;  Location: [facility] INVASIVE CV LAB;  Service: Cardiovascular;  Laterality: N/A;  . CARDIOVERSION N/A 03/27/2017   Procedure: CARDIOVERSION;  Surgeon: Iran OuchArida, Muhammad A, MD;  Location: [facility] ORS;  Service: Cardiovascular;  Laterality: N/A;  . CARDIOVERSION N/A 07/30/2018   Procedure: CARDIOVERSION;  Surgeon: Antonieta IbaGollan, Timothy J, MD;  Location: [facility] ORS;  Service: Cardiovascular;  Laterality:  N/A;  . CORONARY ANGIOPLASTY  2009   [facility]    Current Meds  Medication Sig  . albuterol-ipratropium (COMBIVENT) 18-103 MCG/ACT inhaler Inhale 2 puffs into the lungs every 4 (four) hours as needed for wheezing.  [name]. amiodarone (PACERONE) 400 MG tablet Take 1 tablet (400 mg total) by mouth 2 (two) times daily.  [name]. atorvastatin (LIPITOR) 40 MG tablet Take 40 mg by mouth daily.   . budesonide-formoterol (SYMBICORT) 160-4.5 MCG/ACT inhaler Inhale 2 puffs into the lungs 2 (two) times daily.  . carvedilol (COREG) 6.25 MG tablet Take 1 tablet (6.25 mg total) by mouth 2 (two) times daily with a meal.  . docusate sodium (COLACE) 100 MG capsule Take 100 mg by mouth 2 (two) times daily.  . isosorbide-hydrALAZINE (BIDIL) 20-37.5 MG tablet Take 1 tablet by mouth 2 (two) times daily.  . nitroGLYCERIN (NITROSTAT) 0.4 MG SL tablet Place 0.4 mg under the tongue every 5 (five) minutes as needed for chest pain.  . potassium chloride  SA (K-DUR,KLOR-CON) 20 MEQ tablet Take 1 tablet (20 mEq total) by mouth daily. (Patient taking differently: Take 20 mEq by mouth 2 (two) times daily. )  . rivaroxaban (XARELTO) 20 MG TABS tablet Take 1 tablet (20 mg total) by mouth daily with supper.  [name] spironolactone (ALDACTONE) 25 MG tablet Take 1 tablet (25 mg total) by mouth daily.  [name] torsemide (DEMADEX) 20 MG tablet Take 2 tablets (40 mg total) by mouth 2 (two) times daily.  . traZODone (DESYREL) 50 MG tablet Take 50 mg by mouth at bedtime.    Allergies:   Aspirin; Ibuprofen; and Tylenol [acetaminophen]   Social History:  The patient  reports that he has quit smoking. His smoking use included cigarettes. He has a 35.00 pack-year smoking history. He has never used smokeless tobacco. He reports previous alcohol use. He reports that he does not use drugs.   Family History:  The patient's family history includes Cancer in his brother; Heart attack (age of onset: 72) in his mother; Hypertension in his  mother.  ROS:   Review of Systems  Constitutional: Positive for malaise/fatigue. Negative for chills, diaphoresis, fever and weight loss.  HENT: Negative for congestion.   Eyes: Negative for discharge and redness.  Respiratory: Positive for shortness of breath. Negative for cough, hemoptysis, sputum production and wheezing.        Much improved shortness of breath  Cardiovascular: Positive for leg swelling. Negative for chest pain, palpitations, orthopnea, claudication and PND.       Much improved lower extremity swelling  Gastrointestinal: Negative for abdominal pain, blood in stool, heartburn, melena, nausea and vomiting.  Genitourinary: Negative for hematuria.  Musculoskeletal: Negative for falls and myalgias.  Skin: Negative for rash.  Neurological: Negative for dizziness, tingling, tremors, sensory change, speech change, focal weakness, loss of consciousness and weakness.  Endo/Heme/Allergies: Does not bruise/bleed easily.  Psychiatric/Behavioral: Negative for substance abuse. The patient is not nervous/anxious.   All other systems reviewed and are negative.    PHYSICAL EXAM:  VS:  BP 132/82 (BP Location: Left Arm, Patient Position: Sitting, Cuff Size: Small)   Pulse 69   Ht 5\' 11"  (1.803 m)   Wt 217 lb 4 oz (98.5 kg)   BMI 30.30 kg/m  BMI: Body mass index is 30.3 kg/m.  Physical Exam  Constitutional: He is oriented to person, place, and time. He appears well-developed and well-nourished.  HENT:  Head: Normocephalic and atraumatic.  Eyes: Right eye exhibits no discharge. Left eye exhibits no discharge.  Neck: Normal range of motion. No JVD present.  Cardiovascular: Normal rate, regular rhythm, S1 normal, S2 normal and normal heart sounds. Exam reveals no distant heart sounds, no friction rub, no midsystolic click and no opening snap.  No murmur heard. Pulmonary/Chest: Effort normal and breath sounds normal. No respiratory distress. He has no decreased breath sounds. He has  no wheezes. He has no rales. He exhibits no tenderness.  Abdominal: Soft. He exhibits no distension. There is no abdominal tenderness.  Musculoskeletal:        General: Edema present.     Comments: Trace bilateral pretibial edema  Neurological: He is alert and oriented to person, place, and time.  Skin: Skin is warm and dry. No cyanosis. Nails show no clubbing.  Psychiatric: He has a normal mood and affect. His speech is normal and behavior is normal. Judgment and thought content normal.     EKG:  Was ordered and interpreted by me today. Shows NSR, 68 bpm, first-degree  AV block, left axis deviation, left anterior fascicular block, prior inferior infarct, poor R wave progression through the precordial leads, nonspecific lateral ST-T changes which are not new when compared to prior studies and date back to at least 2013  Recent Labs: 03/04/2018: B Natriuretic Peptide 524.0 07/26/2018: ALT 31 07/31/2018: BUN 46; Creatinine, Ser 1.80; Hemoglobin 13.2; Magnesium 2.2; Platelets 156; Potassium 3.3; Sodium 137  06/14/2018: Cholesterol 139; HDL 33; LDL Cholesterol 79; Total CHOL/HDL Ratio 4.2; Triglycerides 135; VLDL 27   Estimated Creatinine Clearance: 51.6 mL/min (A) (by C-G formula based on SCr of 1.8 mg/dL (H)).   Wt Readings from Last 3 Encounters:  08/10/18 217 lb 4 oz (98.5 kg)  07/31/18 231 lb 6.4 oz (105 kg)  06/13/18 216 lb 7.9 oz (98.2 kg)     Other studies reviewed: Additional studies/records reviewed today include: summarized above  ASSESSMENT AND PLAN:  1. CAD involving the native coronary arteries without angina with history of elevated troponin: No symptoms suggestive of angina.  He was noted to have a mildly elevated troponin during most recent admission which was felt to be secondary to supply demand ischemia in the setting of sepsis with UTI/pyelonephritis, mild volume overload, and A. fib with RVR.  Schedule Lexiscan Myoview to evaluate for high risk ischemia.  He is on Xarelto  in place of aspirin.  Continue Coreg and Lipitor as outlined below.  Aggressive risk factor modification.  2. HFrEF secondary to ICM: He appears well compensated and grossly euvolemic on exam today.  We are checking a chest x-ray at the patient's request as they report this is needed [facility] this [facility].  Increase BiDil to 20/37.5 mg 3 times daily.  He will otherwise continue Coreg and spironolactone along with torsemide.  Check CMP as outlined below.  Based on these labs, we may need to discontinue his potassium supplementation.  I did discuss with the patient if his renal function allows we should consider ACE inhibitor versus ARB versus Entresto in an effort to optimize his heart failure regimen.  Following optimization of heart failure regimen, if his EF remains less than 35% on repeat limited echo later this year we should consider referral to EP for consideration of ICD.  This was discussed in detail with the patient.  We will plan to repeat limited echocardiogram in approximately 3 months time.  3. Persistent A. Fib: Maintaining sinus rhythm following recent cardioversion.  Continue Coreg for rate control as well as amiodarone for rhythm control.  Decrease amiodarone to 200 mg twice daily for the next 7 days followed by 200 mg daily thereafter.  Remains on Xarelto 20 mg daily with most recent Estimated Creatinine Clearance: 51.6 mL/min (A) (by C-G formula based on SCr of 1.8 mg/dL (H)).  Check CMP and TSH given amiodarone usage.  4. Acute on CKD stage II: Check CMP.  If needed, we may need to temporarily suspend his Spironolactone.  5. Carotid artery disease/recent CVA: Followed by vascular surgery.  They are planning for right-sided CEA once he is cleared from a cardiac perspective.  We will need to pursue ischemic evaluation as outlined above prior to providing risk stratification.  Continue optimal blood pressure and lipid control.  6. Hypertension: Blood pressure is reasonably  controlled today.  Increase BiDil to 20/37.5 mg 3 times daily.  He will otherwise remain on carvedilol 6.25 mg twice daily as well as Spironolactone 25 mg daily.  7. Hyperlipidemia: Most recent LDL of 79 from 05/2018.  Remains on atorvastatin 40 mg daily.  In follow-up, would consider titrating atorvastatin to 80 mg daily in an effort to achieve goal LDL less than 70.  May need to add Zetia.  Following these adjustments, if LDL remains above goal would recommend PCSK9 inhibitor.  8. Diabetes: Per PCP.  Metformin has been held secondary to AKI.  I advised the patient and his wife to discuss this with nephrology and his PCP.  9. COPD: No acute exacerbation.  Followed by PCP.  Disposition: F/u with Dr. Kirke CorinArida or an APP in 4 weeks.  Current medicines are reviewed at length with the patient today.  The patient did not have any concerns regarding medicines.  Signed, Eula Listenyan Elfriede Bonini, PA-C 08/10/2018 11:21 AM     [facility] [address] [place], [place] [address] [phone]

## 2018-08-10 ENCOUNTER — Ambulatory Visit: Payer: Self-pay | Admitting: Physician Assistant

## 2018-08-10 ENCOUNTER — Ambulatory Visit
Admission: RE | Admit: 2018-08-10 | Discharge: 2018-08-10 | Disposition: A | Discharge status: Home / Self Care | Payer: Self-pay | Source: Ambulatory Visit | Attending: Physician Assistant | Admitting: Physician Assistant

## 2018-08-10 ENCOUNTER — Encounter: Payer: Self-pay | Admitting: Physician Assistant

## 2018-08-10 ENCOUNTER — Other Ambulatory Visit
Admission: RE | Admit: 2018-08-10 | Discharge: 2018-08-10 | Disposition: A | Discharge status: Home / Self Care | Payer: Self-pay | Source: Ambulatory Visit | Attending: Physician Assistant | Admitting: Physician Assistant

## 2018-08-10 VITALS — BP 132/82 | HR 69 | Ht 71.0 in | Wt 217.2 lb

## 2018-08-10 DIAGNOSIS — I5022 Chronic systolic (congestive) heart failure: Secondary | ICD-10-CM

## 2018-08-10 DIAGNOSIS — Z8673 Personal history of transient ischemic attack (TIA), and cerebral infarction without residual deficits: Secondary | ICD-10-CM

## 2018-08-10 DIAGNOSIS — I6521 Occlusion and stenosis of right carotid artery: Secondary | ICD-10-CM

## 2018-08-10 DIAGNOSIS — I255 Ischemic cardiomyopathy: Secondary | ICD-10-CM

## 2018-08-10 DIAGNOSIS — I2510 Atherosclerotic heart disease of native coronary artery without angina pectoris: Secondary | ICD-10-CM

## 2018-08-10 DIAGNOSIS — N182 Chronic kidney disease, stage 2 (mild): Secondary | ICD-10-CM

## 2018-08-10 DIAGNOSIS — R7989 Other specified abnormal findings of blood chemistry: Secondary | ICD-10-CM

## 2018-08-10 DIAGNOSIS — I4819 Other persistent atrial fibrillation: Secondary | ICD-10-CM

## 2018-08-10 DIAGNOSIS — I10 Essential (primary) hypertension: Secondary | ICD-10-CM

## 2018-08-10 DIAGNOSIS — N179 Acute kidney failure, unspecified: Secondary | ICD-10-CM

## 2018-08-10 DIAGNOSIS — R778 Other specified abnormalities of plasma proteins: Secondary | ICD-10-CM

## 2018-08-10 DIAGNOSIS — N189 Chronic kidney disease, unspecified: Secondary | ICD-10-CM

## 2018-08-10 DIAGNOSIS — E785 Hyperlipidemia, unspecified: Secondary | ICD-10-CM

## 2018-08-10 LAB — COMPREHENSIVE METABOLIC PANEL
ALT: 66 U/L — ABNORMAL HIGH (ref 0–44)
AST: 60 U/L — AB (ref 15–41)
Albumin: 3.6 g/dL (ref 3.5–5.0)
Alkaline Phosphatase: 268 U/L — ABNORMAL HIGH (ref 38–126)
Anion gap: 7 (ref 5–15)
BUN: 30 mg/dL — AB (ref 8–23)
CO2: 31 mmol/L (ref 22–32)
Calcium: 9.6 mg/dL (ref 8.9–10.3)
Chloride: 100 mmol/L (ref 98–111)
Creatinine, Ser: 1.74 mg/dL — ABNORMAL HIGH (ref 0.61–1.24)
GFR calc Af Amer: 48 mL/min — ABNORMAL LOW (ref >60)
GFR calc non Af Amer: 41 mL/min — ABNORMAL LOW (ref >60)
Glucose, Bld: 217 mg/dL — ABNORMAL HIGH (ref 70–99)
POTASSIUM: 4.5 mmol/L (ref 3.5–5.1)
Sodium: 138 mmol/L (ref 135–145)
Total Bilirubin: 2.3 mg/dL — ABNORMAL HIGH (ref 0.3–1.2)
Total Protein: 7.7 g/dL (ref 6.5–8.1)

## 2018-08-10 LAB — CBC
HCT: 44 % (ref 39.0–52.0)
Hemoglobin: 13.4 g/dL (ref 13.0–17.0)
MCH: 27.9 pg (ref 26.0–34.0)
MCHC: 30.5 g/dL (ref 30.0–36.0)
MCV: 91.7 fL (ref 80.0–100.0)
Platelets: 168 10*3/uL (ref 150–400)
RBC: 4.8 MIL/uL (ref 4.22–5.81)
RDW: 17.5 % — ABNORMAL HIGH (ref 11.5–15.5)
WBC: 6.1 10*3/uL (ref 4.0–10.5)
nRBC: 0 % (ref 0.0–0.2)

## 2018-08-10 LAB — TSH: TSH: 5.829 u[IU]/mL — ABNORMAL HIGH (ref 0.350–4.500)

## 2018-08-10 MED ORDER — ISOSORB DINITRATE-HYDRALAZINE 20-37.5 MG PO TABS: 1.0000 | ORAL_TABLET | Freq: Three times a day (TID) | ORAL | 6 refills | Status: DC

## 2018-08-10 MED ORDER — AMIODARONE HCL 200 MG PO TABS: ORAL_TABLET | ORAL | 3 refills | Status: DC

## 2018-08-10 NOTE — Patient Instructions (Addendum)
Medication Instructions:  Your physician has recommended you make the following change in your medication:  1- DECREASE Amiodarone Take 1 tablet (200 mg total) twice daily for 1 week, then 1 tablet (200 mg total) once daily thereafter 2- INCREASE Bidil Take 1 tablet (20-37.5 mg )  by mouth 3 (three) times daily If you need a refill on your cardiac medications before your next appointment, please call your pharmacy.   Lab work: Your physician recommends that you return for lab work today in the medical mall. (CMET, CBC, TSH)   If you have labs (blood work) drawn today and your tests are completely normal, you will receive your results only by: [name] MyChart Message (if you have MyChart) OR . A paper copy in the mail If you have any lab test that is abnormal or we need to change your treatment, we will call you to review the results.  Testing/Procedures: 1- Chest xray today in the medical mall   Follow-Up: At [facility], you and your health needs are our priority.  As part of our continuing mission to provide you with exceptional heart care, we have created designated Provider Care Teams.  These Care Teams include your primary Cardiologist (physician) and Advanced Practice Providers (APPs -  Physician Assistants and Nurse Practitioners) who all work together to provide you with the care you need, when you need it. You will need a follow up appointment in 1 months.  You may see Dr. Kirke Corin or Eula Listen, PA-C

## 2018-08-11 ENCOUNTER — Telehealth: Payer: Self-pay

## 2018-08-11 NOTE — Telephone Encounter (Signed)
-----   Message from Sondra Barges, PA-C sent at 08/10/2018  7:04 PM EST ----- Thyroid function is mildly elevated. Possibly sick euthyroid in the setting of his recent illness. He should follow up with his PCP with this. We will need to obtain repeat thyroid testing at follow up.  Renal function remains above baseline, though is improved from his discharge value.  Potassium is improved and at goal. I would like for him to discontinue supplemental KCl given he is also on spironolactone.  Liver function is also elevated and should be followed up with by his PCP. This too will need to be followed up on given he is on amiodarone.  Blood count is normal.

## 2018-08-11 NOTE — Telephone Encounter (Signed)
Call to wife for results and suggestions from provider. She verbalized understanding and had no further questions at this time.   Files printed for pt to take to [facility] tomorrow and placed at the front desk for wife to pick up.  K Cl order d/c per provider request.   Advised pt to call for any further questions or concerns.

## 2018-08-17 ENCOUNTER — Telehealth: Payer: Self-pay | Admitting: Physician Assistant

## 2018-08-17 NOTE — Telephone Encounter (Signed)
Called and spoke with patient's wife.  She said he was up all night last night coughing. When he would cough he has yellow/gold mucous and sputum. She said he was cold and had chills. She didn't think he had a fever. They went up to the [place] this morning but they said he could not be seen today and sent him back home to rest. Advised her to take him to an Urgent Care this afternoon if able.  She said she should be able to take him somewhere this afternoon.

## 2018-08-17 NOTE — Telephone Encounter (Signed)
New Message    Received call from answering service. Patients wife called because the patient has been coughing all night and have lost 30lb since being released from the hospital. Please call.

## 2018-08-17 NOTE — Telephone Encounter (Signed)
Pt c/o Shortness Of Breath: STAT if SOB developed within the last 24 hours or pt is noticeably SOB on the phone  1. Are you currently SOB (can you hear that pt is SOB on the phone)? Patient wife call but states yes   2. How long have you been experiencing SOB?  Unclear onset see previous note   3. Are you SOB when sitting or when up moving around?  Having trouble laying flat   4. Are you currently experiencing any other symptoms?  Productive cough

## 2018-08-17 NOTE — Telephone Encounter (Signed)
Please see note below. 

## 2018-08-18 ENCOUNTER — Emergency Department: Payer: Medicare Other

## 2018-08-18 ENCOUNTER — Encounter: Payer: Self-pay | Admitting: Emergency Medicine

## 2018-08-18 ENCOUNTER — Inpatient Hospital Stay
Admission: EM | Admit: 2018-08-18 | Discharge: 2018-08-20 | DRG: 872 | Disposition: A | Discharge status: Home Health | Payer: Medicare Other | Attending: Internal Medicine | Admitting: Internal Medicine

## 2018-08-18 ENCOUNTER — Other Ambulatory Visit: Payer: Self-pay

## 2018-08-18 DIAGNOSIS — I110 Hypertensive heart disease with heart failure: Secondary | ICD-10-CM | POA: Diagnosis present

## 2018-08-18 DIAGNOSIS — E785 Hyperlipidemia, unspecified: Secondary | ICD-10-CM | POA: Diagnosis present

## 2018-08-18 DIAGNOSIS — J441 Chronic obstructive pulmonary disease with (acute) exacerbation: Secondary | ICD-10-CM | POA: Diagnosis present

## 2018-08-18 DIAGNOSIS — Z87891 Personal history of nicotine dependence: Secondary | ICD-10-CM | POA: Diagnosis not present

## 2018-08-18 DIAGNOSIS — Z23 Encounter for immunization: Secondary | ICD-10-CM

## 2018-08-18 DIAGNOSIS — Z886 Allergy status to analgesic agent status: Secondary | ICD-10-CM

## 2018-08-18 DIAGNOSIS — E119 Type 2 diabetes mellitus without complications: Secondary | ICD-10-CM | POA: Diagnosis present

## 2018-08-18 DIAGNOSIS — Z8673 Personal history of transient ischemic attack (TIA), and cerebral infarction without residual deficits: Secondary | ICD-10-CM | POA: Diagnosis not present

## 2018-08-18 DIAGNOSIS — I255 Ischemic cardiomyopathy: Secondary | ICD-10-CM | POA: Diagnosis present

## 2018-08-18 DIAGNOSIS — Z8249 Family history of ischemic heart disease and other diseases of the circulatory system: Secondary | ICD-10-CM

## 2018-08-18 DIAGNOSIS — I5022 Chronic systolic (congestive) heart failure: Secondary | ICD-10-CM | POA: Diagnosis present

## 2018-08-18 DIAGNOSIS — I4819 Other persistent atrial fibrillation: Secondary | ICD-10-CM | POA: Diagnosis present

## 2018-08-18 DIAGNOSIS — Z7901 Long term (current) use of anticoagulants: Secondary | ICD-10-CM | POA: Diagnosis not present

## 2018-08-18 DIAGNOSIS — Z7951 Long term (current) use of inhaled steroids: Secondary | ICD-10-CM

## 2018-08-18 DIAGNOSIS — I2510 Atherosclerotic heart disease of native coronary artery without angina pectoris: Secondary | ICD-10-CM | POA: Diagnosis present

## 2018-08-18 DIAGNOSIS — A419 Sepsis, unspecified organism: Principal | ICD-10-CM | POA: Diagnosis present

## 2018-08-18 DIAGNOSIS — R0602 Shortness of breath: Secondary | ICD-10-CM | POA: Diagnosis present

## 2018-08-18 DIAGNOSIS — Z955 Presence of coronary angioplasty implant and graft: Secondary | ICD-10-CM | POA: Diagnosis not present

## 2018-08-18 DIAGNOSIS — J101 Influenza due to other identified influenza virus with other respiratory manifestations: Secondary | ICD-10-CM | POA: Diagnosis present

## 2018-08-18 DIAGNOSIS — M109 Gout, unspecified: Secondary | ICD-10-CM | POA: Diagnosis present

## 2018-08-18 DIAGNOSIS — Z79899 Other long term (current) drug therapy: Secondary | ICD-10-CM

## 2018-08-18 LAB — CBC WITH DIFFERENTIAL/PLATELET
Abs Immature Granulocytes: 0.01 10*3/uL (ref 0.00–0.07)
Basophils Absolute: 0 10*3/uL (ref 0.0–0.1)
Basophils Relative: 0 %
Eosinophils Absolute: 0 10*3/uL (ref 0.0–0.5)
Eosinophils Relative: 0 %
HEMATOCRIT: 51.1 % (ref 39.0–52.0)
Hemoglobin: 16 g/dL (ref 13.0–17.0)
Immature Granulocytes: 0 %
Lymphocytes Relative: 23 %
Lymphs Abs: 0.8 10*3/uL (ref 0.7–4.0)
MCH: 28.4 pg (ref 26.0–34.0)
MCHC: 31.3 g/dL (ref 30.0–36.0)
MCV: 90.6 fL (ref 80.0–100.0)
Monocytes Absolute: 0.5 10*3/uL (ref 0.1–1.0)
Monocytes Relative: 14 %
NRBC: 0 % (ref 0.0–0.2)
Neutro Abs: 2 10*3/uL (ref 1.7–7.7)
Neutrophils Relative %: 63 %
Platelets: 157 10*3/uL (ref 150–400)
RBC: 5.64 MIL/uL (ref 4.22–5.81)
RDW: 18.6 % — ABNORMAL HIGH (ref 11.5–15.5)
WBC: 3.2 10*3/uL — AB (ref 4.0–10.5)

## 2018-08-18 LAB — INFLUENZA PANEL BY PCR (TYPE A & B)
INFLBPCR: POSITIVE — AB
Influenza A By PCR: NEGATIVE

## 2018-08-18 LAB — COMPREHENSIVE METABOLIC PANEL
ALBUMIN: 3.8 g/dL (ref 3.5–5.0)
ALT: 36 U/L (ref 0–44)
ANION GAP: 11 (ref 5–15)
AST: 40 U/L (ref 15–41)
Alkaline Phosphatase: 262 U/L — ABNORMAL HIGH (ref 38–126)
BUN: 28 mg/dL — ABNORMAL HIGH (ref 8–23)
CO2: 26 mmol/L (ref 22–32)
Calcium: 9.1 mg/dL (ref 8.9–10.3)
Chloride: 98 mmol/L (ref 98–111)
Creatinine, Ser: 1.85 mg/dL — ABNORMAL HIGH (ref 0.61–1.24)
GFR calc non Af Amer: 38 mL/min — ABNORMAL LOW (ref >60)
GFR, EST AFRICAN AMERICAN: 45 mL/min — AB (ref >60)
Glucose, Bld: 128 mg/dL — ABNORMAL HIGH (ref 70–99)
Potassium: 3.9 mmol/L (ref 3.5–5.1)
Sodium: 135 mmol/L (ref 135–145)
Total Bilirubin: 2.2 mg/dL — ABNORMAL HIGH (ref 0.3–1.2)
Total Protein: 8.7 g/dL — ABNORMAL HIGH (ref 6.5–8.1)

## 2018-08-18 LAB — GLUCOSE, CAPILLARY
GLUCOSE-CAPILLARY: 363 mg/dL — AB (ref 70–99)
Glucose-Capillary: 145 mg/dL — ABNORMAL HIGH (ref 70–99)
Glucose-Capillary: 377 mg/dL — ABNORMAL HIGH (ref 70–99)

## 2018-08-18 LAB — APTT: aPTT: 38 seconds — ABNORMAL HIGH (ref 24–36)

## 2018-08-18 LAB — LACTIC ACID, PLASMA
Lactic Acid, Venous: 1.5 mmol/L (ref 0.5–1.9)
Lactic Acid, Venous: 1.8 mmol/L (ref 0.5–1.9)

## 2018-08-18 LAB — LIPASE, BLOOD: Lipase: 18 U/L (ref 11–51)

## 2018-08-18 LAB — PROCALCITONIN: Procalcitonin: 0.41 ng/mL

## 2018-08-18 MED ORDER — INFLUENZA VAC SPLIT QUAD 0.5 ML IM SUSY
0.5000 mL | PREFILLED_SYRINGE | INTRAMUSCULAR | Status: DC
  Filled 2018-08-18: qty 0.5

## 2018-08-18 MED ORDER — INSULIN ASPART 100 UNIT/ML ~~LOC~~ SOLN
0.0000 [IU] | Freq: Three times a day (TID) | SUBCUTANEOUS | Status: DC
  Administered 2018-08-18: 15:00:00 3 [IU] via SUBCUTANEOUS
  Administered 2018-08-18: 15 [IU] via SUBCUTANEOUS
  Administered 2018-08-19: 8 [IU] via SUBCUTANEOUS
  Administered 2018-08-19: 18:00:00 11 [IU] via SUBCUTANEOUS
  Administered 2018-08-19: 8 [IU] via SUBCUTANEOUS
  Administered 2018-08-20: 09:00:00 3 [IU] via SUBCUTANEOUS
  Filled 2018-08-18 (×5): qty 1

## 2018-08-18 MED ORDER — DOCUSATE SODIUM 100 MG PO CAPS
100.0000 mg | ORAL_CAPSULE | Freq: Two times a day (BID) | ORAL | Status: DC
  Administered 2018-08-18 – 2018-08-20 (×4): 100 mg via ORAL
  Filled 2018-08-18 (×6): qty 1

## 2018-08-18 MED ORDER — OXYCODONE HCL 5 MG PO TABS: 5.0000 mg | ORAL_TABLET | ORAL | Status: DC | PRN

## 2018-08-18 MED ORDER — TRAZODONE HCL 50 MG PO TABS
50.0000 mg | ORAL_TABLET | Freq: Every day | ORAL | Status: DC
  Administered 2018-08-19: 50 mg via ORAL
  Filled 2018-08-18 (×2): qty 1

## 2018-08-18 MED ORDER — SODIUM CHLORIDE 0.9 % IV BOLUS (SEPSIS)
1000.0000 mL | Freq: Once | INTRAVENOUS | Status: AC
  Administered 2018-08-18: 1000 mL via INTRAVENOUS

## 2018-08-18 MED ORDER — RIVAROXABAN 20 MG PO TABS
20.0000 mg | ORAL_TABLET | Freq: Every day | ORAL | Status: DC
  Administered 2018-08-18: 20 mg via ORAL
  Filled 2018-08-18 (×3): qty 1

## 2018-08-18 MED ORDER — IPRATROPIUM-ALBUTEROL 0.5-2.5 (3) MG/3ML IN SOLN
3.0000 mL | Freq: Once | RESPIRATORY_TRACT | Status: AC
  Administered 2018-08-18: 3 mL via RESPIRATORY_TRACT
  Filled 2018-08-18: qty 3

## 2018-08-18 MED ORDER — OSELTAMIVIR PHOSPHATE 75 MG PO CAPS
75.0000 mg | ORAL_CAPSULE | Freq: Once | ORAL | Status: AC
  Administered 2018-08-18: 75 mg via ORAL
  Filled 2018-08-18: qty 1

## 2018-08-18 MED ORDER — METHYLPREDNISOLONE SODIUM SUCC 125 MG IJ SOLR
125.0000 mg | Freq: Once | INTRAMUSCULAR | Status: AC
  Administered 2018-08-18: 125 mg via INTRAVENOUS
  Filled 2018-08-18: qty 2

## 2018-08-18 MED ORDER — SODIUM CHLORIDE 0.9 % IV SOLN
500.0000 mg | Freq: Once | INTRAVENOUS | Status: AC
  Administered 2018-08-18: 500 mg via INTRAVENOUS
  Filled 2018-08-18: qty 500

## 2018-08-18 MED ORDER — OSELTAMIVIR PHOSPHATE 75 MG PO CAPS: 75.0000 mg | ORAL_CAPSULE | Freq: Two times a day (BID) | ORAL | Status: DC

## 2018-08-18 MED ORDER — VANCOMYCIN HCL IN DEXTROSE 1-5 GM/200ML-% IV SOLN: 1000.0000 mg | Freq: Once | INTRAVENOUS | Status: DC

## 2018-08-18 MED ORDER — ATORVASTATIN CALCIUM 20 MG PO TABS
40.0000 mg | ORAL_TABLET | Freq: Every day | ORAL | Status: DC
  Administered 2018-08-18 – 2018-08-20 (×3): 40 mg via ORAL
  Filled 2018-08-18 (×5): qty 2

## 2018-08-18 MED ORDER — ONDANSETRON HCL 4 MG/2ML IJ SOLN: 4.0000 mg | Freq: Four times a day (QID) | INTRAMUSCULAR | Status: DC | PRN

## 2018-08-18 MED ORDER — VANCOMYCIN HCL 10 G IV SOLR
2000.0000 mg | Freq: Once | INTRAVENOUS | Status: DC
  Administered 2018-08-18: 2000 mg via INTRAVENOUS
  Filled 2018-08-18: qty 2000

## 2018-08-18 MED ORDER — MOMETASONE FURO-FORMOTEROL FUM 200-5 MCG/ACT IN AERO
2.0000 | INHALATION_SPRAY | Freq: Two times a day (BID) | RESPIRATORY_TRACT | Status: DC
  Administered 2018-08-18 – 2018-08-20 (×4): 2 via RESPIRATORY_TRACT
  Filled 2018-08-18 (×2): qty 8.8

## 2018-08-18 MED ORDER — PNEUMOCOCCAL 13-VAL CONJ VACC IM SUSP
0.5000 mL | INTRAMUSCULAR | Status: AC
  Administered 2018-08-20: 09:00:00 0.5 mL via INTRAMUSCULAR
  Filled 2018-08-18: qty 0.5

## 2018-08-18 MED ORDER — METHYLPREDNISOLONE SODIUM SUCC 125 MG IJ SOLR
60.0000 mg | INTRAMUSCULAR | Status: DC
  Administered 2018-08-18 – 2018-08-19 (×2): 60 mg via INTRAVENOUS
  Filled 2018-08-18 (×3): qty 2

## 2018-08-18 MED ORDER — ALBUTEROL SULFATE (2.5 MG/3ML) 0.083% IN NEBU
5.0000 mg | INHALATION_SOLUTION | Freq: Once | RESPIRATORY_TRACT | Status: AC
  Administered 2018-08-18: 5 mg via RESPIRATORY_TRACT
  Filled 2018-08-18: qty 6

## 2018-08-18 MED ORDER — OSELTAMIVIR PHOSPHATE 30 MG PO CAPS
30.0000 mg | ORAL_CAPSULE | Freq: Two times a day (BID) | ORAL | Status: DC
  Administered 2018-08-18 – 2018-08-20 (×4): 30 mg via ORAL
  Filled 2018-08-18 (×5): qty 1

## 2018-08-18 MED ORDER — POLYETHYLENE GLYCOL 3350 17 G PO PACK: 17.0000 g | PACK | Freq: Every day | ORAL | Status: DC | PRN

## 2018-08-18 MED ORDER — AMIODARONE HCL 200 MG PO TABS
200.0000 mg | ORAL_TABLET | Freq: Every day | ORAL | Status: DC
  Administered 2018-08-18 – 2018-08-20 (×3): 200 mg via ORAL
  Filled 2018-08-18 (×5): qty 1

## 2018-08-18 MED ORDER — SODIUM CHLORIDE 0.9 % IV SOLN
2.0000 g | Freq: Once | INTRAVENOUS | Status: AC
  Administered 2018-08-18: 2 g via INTRAVENOUS
  Filled 2018-08-18: qty 2

## 2018-08-18 MED ORDER — ISOSORB DINITRATE-HYDRALAZINE 20-37.5 MG PO TABS
1.0000 | ORAL_TABLET | Freq: Three times a day (TID) | ORAL | Status: DC
  Administered 2018-08-18 – 2018-08-20 (×6): 1 via ORAL
  Filled 2018-08-18 (×11): qty 1

## 2018-08-18 MED ORDER — TORSEMIDE 20 MG PO TABS
40.0000 mg | ORAL_TABLET | Freq: Two times a day (BID) | ORAL | Status: DC
  Administered 2018-08-18 – 2018-08-20 (×4): 40 mg via ORAL
  Filled 2018-08-18 (×6): qty 2

## 2018-08-18 MED ORDER — SPIRONOLACTONE 25 MG PO TABS
25.0000 mg | ORAL_TABLET | Freq: Every day | ORAL | Status: DC
  Administered 2018-08-18 – 2018-08-20 (×3): 25 mg via ORAL
  Filled 2018-08-18 (×5): qty 1

## 2018-08-18 MED ORDER — ONDANSETRON HCL 4 MG PO TABS: 4.0000 mg | ORAL_TABLET | Freq: Four times a day (QID) | ORAL | Status: DC | PRN

## 2018-08-18 MED ORDER — NITROGLYCERIN 0.4 MG SL SUBL: 0.4000 mg | SUBLINGUAL_TABLET | SUBLINGUAL | Status: DC | PRN

## 2018-08-18 MED ORDER — CARVEDILOL 3.125 MG PO TABS
6.2500 mg | ORAL_TABLET | Freq: Two times a day (BID) | ORAL | Status: DC
  Administered 2018-08-18 – 2018-08-20 (×4): 6.25 mg via ORAL
  Filled 2018-08-18 (×5): qty 2

## 2018-08-18 MED ORDER — IPRATROPIUM-ALBUTEROL 0.5-2.5 (3) MG/3ML IN SOLN
3.0000 mL | RESPIRATORY_TRACT | Status: DC
  Administered 2018-08-18 – 2018-08-19 (×6): 3 mL via RESPIRATORY_TRACT
  Filled 2018-08-18 (×6): qty 3

## 2018-08-18 NOTE — ED Provider Notes (Signed)
[facility] Emergency Department Provider Note  ____________________________________________  Time seen: Approximately 8:21 AM  I have reviewed the triage vital signs and the nursing notes.   HISTORY  Chief Complaint Shortness of Breath and Cough    HPI [name] is a 63 y.o. male with a history of CAD, diabetes, hypertension, COPD who reports having increasing shortness of breath and productive cough over the last 4 or 5 days.  Overnight his shortness of breath feels much worse it is now associated with constant chest tightness that in the center of the chest, nonradiating, no aggravating or alleviating factors, not exertional or pleuritic.  No fever but he does endorse chills.      Past Medical History:  Diagnosis Date  . CAD in native artery    a. stress echo 12/2007 abnl, EF > 55%, b. LHC 01/28/08: mLAD 30, D1 40, dLCx 70, pRCA 30, mRCA 70, mRCA lesion 2 80, PDA 90, s/p PCI/BMS to prox and distal RCA, s/p PCI/BMS to PDA; c. patient reports PCI/stenting x 2 in early 2017 at the [facility] (no records on file) d. 06/2016: cath showing patent stents along RCA and LCx with moderate 40% stenosis along the LAD.   [name]. Carotid arterial disease (HCC)    a. 05/2018 CTA Head/neck: 80-90% stenosis @ L carotid bifurcation, extending into the prox LICA. 60% stenosis @ R carotid bifurcation. 50-75% bilateral distal cavernous carotid dzs.  . Chronic combined systolic (congestive) and diastolic (congestive) heart failure (HCC)    a. 2009 Echo: > 55%; b. 06/2016: EF 25-30%; c. 12/2017 Echo: EF 30-35%, diff HK; d. 05/2018 Echo: 30-35%, mild conc LVH, diff HK. Mild MR. Mildly dil LA/RA.  [name]. COPD (chronic obstructive pulmonary disease) (HCC)   . Diabetes mellitus without complication (HCC)   . Gout   . Hyperlipidemia   . Hypertensive heart disease   . Ischemic cardiomyopathy    a. 05/2018: 30-35%.  . Obesity   . Persistent atrial fibrillation 01/2017   a. cardioverted 9/18 to NSR; b.  12/2017 noted to be back in Afib-outpt dccv rec; c. CHA2DS2VASc = 6-->Xarelto.  . Stroke (cerebrum) (HCC)    a. 05/2018 MRI: small patchy acute to subacute cortial and white matter infarct in the bilat cerebrum. Mild chronic small vessel ischmia; b. 05/2018 Carotid U/S: L-carotid bifurcation 80-90% stenosed, R- carotid bifurcation 60% stenosed.  . Tobacco abuse      Patient Active Problem List   Diagnosis Date Noted  . HFrEF (heart failure with reduced ejection fraction) (HCC)   . Sepsis secondary to UTI (HCC) 07/25/2018  . Lactic acidosis 07/25/2018  . Nausea and vomiting 07/25/2018  . AKI (acute kidney injury) (HCC) 07/25/2018  . Hyperbilirubinemia 07/25/2018  . Sepsis (HCC) 07/25/2018  . Acute CVA (cerebrovascular accident) (HCC) 06/14/2018  . Slurred speech 06/13/2018  . Carotid stenosis 06/13/2018  . Elevated troponin 06/13/2018  . Atrial fibrillation, chronic 06/13/2018  . Type 2 diabetes mellitus without complication (HCC) 06/13/2018  . TIA (transient ischemic attack) 06/13/2018  . TIA involving carotid artery 06/13/2018  . Acute respiratory failure with hypoxia (HCC) 01/19/2018  . Atrial fibrillation with RVR (HCC) 03/26/2017  . Atrial flutter with rapid ventricular response (HCC) 02/18/2017  . Diastolic CHF, acute on chronic (HCC) 01/06/2017  . CAD in native artery   . Chest pain 01/05/2017  . Ischemic cardiomyopathy 07/26/2016  . Non-sustained ventricular tachycardia (HCC) 07/26/2016  . COPD (chronic obstructive pulmonary disease) (HCC) 07/24/2016  . CAD (coronary artery disease) 07/24/2016  .  HLD (hyperlipidemia) 07/24/2016  . Acute on chronic combined systolic and diastolic CHF (congestive heart failure) (HCC) 07/24/2016  . Acute respiratory distress 07/24/2016  . Tobacco abuse 07/24/2016  . Accelerated hypertension 07/24/2016  . Hypertensive heart disease 07/24/2016     Past Surgical History:  Procedure Laterality Date  . CARDIAC CATHETERIZATION  2009   [facility];    . CARDIAC CATHETERIZATION  2010  . CARDIAC CATHETERIZATION N/A 07/28/2016   Procedure: Right and Left Heart Cath and possible PCI;  Surgeon: Iran Ouch, MD;  Location: [facility] INVASIVE CV LAB;  Service: Cardiovascular;  Laterality: N/A;  . CARDIOVERSION N/A 03/27/2017   Procedure: CARDIOVERSION;  Surgeon: Iran Ouch, MD;  Location: [facility] ORS;  Service: Cardiovascular;  Laterality: N/A;  . CARDIOVERSION N/A 07/30/2018   Procedure: CARDIOVERSION;  Surgeon: Antonieta Iba, MD;  Location: [facility] ORS;  Service: Cardiovascular;  Laterality: N/A;  . CORONARY ANGIOPLASTY  2009   s/p stent placement at [facility].     Prior to Admission medications   Medication Sig Start Date End Date Taking? Authorizing Provider  albuterol-ipratropium (COMBIVENT) 18-103 MCG/ACT inhaler Inhale 2 puffs into the lungs every 4 (four) hours as needed for wheezing.   Yes [provider]  amiodarone (PACERONE) 200 MG tablet Take 1 tablet (200 mg total) twice daily for 1 week, then 1 tablet (200 mg total) once daily thereafter. 08/10/18  Yes Dunn, Raymon Mutton, PA-C  atorvastatin (LIPITOR) 40 MG tablet Take 40 mg by mouth daily.    Yes [provider]  budesonide-formoterol (SYMBICORT) 160-4.5 MCG/ACT inhaler Inhale 2 puffs into the lungs 2 (two) times daily.   Yes [provider]  carvedilol (COREG) 6.25 MG tablet Take 1 tablet (6.25 mg total) by mouth 2 (two) times daily with a meal. 07/31/18  Yes Altamese Dilling, MD  docusate sodium (COLACE) 100 MG capsule Take 100 mg by mouth 2 (two) times daily.   Yes [provider]  isosorbide-hydrALAZINE (BIDIL) 20-37.5 MG tablet Take 1 tablet by mouth 3 (three) times daily. 08/10/18  Yes Dunn, Raymon Mutton, PA-C  nitroGLYCERIN (NITROSTAT) 0.4 MG SL tablet Place 0.4 mg under the tongue every 5 (five) minutes as needed for chest pain.   Yes [provider]  rivaroxaban (XARELTO) 20 MG TABS tablet Take 1 tablet (20 mg total) by mouth daily with  supper. 02/19/17  Yes Adrian Saran, MD  spironolactone (ALDACTONE) 25 MG tablet Take 1 tablet (25 mg total) by mouth daily. 08/01/16  Yes Enedina Finner, MD  torsemide (DEMADEX) 20 MG tablet Take 2 tablets (40 mg total) by mouth 2 (two) times daily. 01/21/18  Yes Shaune Pollack, MD  traZODone (DESYREL) 50 MG tablet Take 50 mg by mouth at bedtime.   Yes [provider]     Allergies Aspirin; Ibuprofen; and Tylenol [acetaminophen]   Family History  Problem Relation Age of Onset  . Heart attack Mother 74  . Hypertension Mother   . Cancer Brother     Social History Social History   Tobacco Use  . Smoking status: Former Smoker    Packs/day: 1.00    Years: 35.00    Pack years: 35.00    Types: Cigarettes  . Smokeless tobacco: Never Used  Substance Use Topics  . Alcohol use: Not Currently    Comment: Quit 8 yrs.  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 Drug use: No    Review of Systems  Constitutional:   No fever positive chills.  ENT:   No sore  throat. No rhinorrhea. Cardiovascular:   Positive as above chest pain without syncope. Respiratory: Positive shortness of breath and productive cough. Gastrointestinal:   Negative for abdominal pain, vomiting and diarrhea.  Musculoskeletal:   Negative for focal pain or swelling All other systems reviewed and are negative except as documented above in ROS and HPI.  ____________________________________________   PHYSICAL EXAM:  VITAL SIGNS: ED Triage Vitals [08/18/18 0758]  Enc Vitals Group     BP (!) 160/93     Pulse Rate (!) 102     Resp (!) 24     Temp (!) 101.3 F (38.5 C)     Temp Source Oral     SpO2 100 %     Weight 202 lb (91.6 kg)     Height 5\' 11"  (1.803 m)     Head Circumference      Peak Flow      Pain Score 8     Pain Loc      Pain Edu?      Excl. in GC?     Vital signs reviewed, nursing assessments reviewed.   Constitutional:   Alert and oriented. Non-toxic appearance. Eyes:   Conjunctivae are normal. EOMI.  PERRL. ENT      Head:   Normocephalic and atraumatic.      Nose:   No congestion/rhinnorhea.       Mouth/Throat:   Dry mucous membranes, no pharyngeal erythema. No peritonsillar mass.       Neck:   No meningismus. Full ROM. Hematological/Lymphatic/Immunilogical:   No cervical lymphadenopathy. Cardiovascular:   Tachycardia heart rate 105. Symmetric bilateral radial and DP pulses.  No murmurs. Cap refill less than 2 seconds. Respiratory:   Tachypnea, diffuse expiratory wheezing with prolonged expiratory phase, no focal crackles.  FEV1 maneuver accentuates wheezing and prolonged expiratory phase Gastrointestinal:   Soft and nontender. Non distended. There is no CVA tenderness.  No rebound, rigidity, or guarding.  Musculoskeletal:   Normal range of motion in all extremities. No joint effusions.  No lower extremity tenderness.  No edema. Neurologic:   Normal speech and language.  Motor grossly intact. No acute focal neurologic deficits are appreciated.  Skin:    Skin is warm, dry and intact. No rash noted.  No petechiae, purpura, or bullae.  ____________________________________________    LABS (pertinent positives/negatives) (all labs ordered are listed, but only abnormal results are displayed) Labs Reviewed  COMPREHENSIVE METABOLIC PANEL - Abnormal; Notable for the following components:      Result Value   Glucose, Bld 128 (*)    BUN 28 (*)    Creatinine, Ser 1.85 (*)    Total Protein 8.7 (*)    Alkaline Phosphatase 262 (*)    Total Bilirubin 2.2 (*)    GFR calc non Af Amer 38 (*)    GFR calc Af Amer 45 (*)    All other components within normal limits  CBC WITH DIFFERENTIAL/PLATELET - Abnormal; Notable for the following components:   WBC 3.2 (*)    RDW 18.6 (*)    All other components within normal limits  APTT - Abnormal; Notable for the following components:   aPTT 38 (*)    All other components within normal limits  INFLUENZA PANEL BY PCR (TYPE A & B) - Abnormal; Notable for  the following components:   Influenza B By PCR POSITIVE (*)    All other components within normal limits  CULTURE, BLOOD (ROUTINE X 2)  CULTURE, BLOOD (ROUTINE X 2)  URINE CULTURE  LACTIC ACID, PLASMA  LIPASE, BLOOD  PROCALCITONIN  LACTIC ACID, PLASMA  URINALYSIS, COMPLETE (UACMP) WITH MICROSCOPIC   ____________________________________________   EKG  Interpreted by me Sinus tachycardia rate 104. Left axis. 1st degree AV block PR . prwp in anterior leads. Nl ST segments and T waves.   ____________________________________________    RADIOLOGY  Dg Chest Port 1 View  Result Date: 08/18/2018 CLINICAL DATA:  Cough and shortness of breath EXAM: PORTABLE CHEST 1 VIEW COMPARISON:  August 10, 2018 FINDINGS: There is no edema or consolidation. There is cardiomegaly. The central pulmonary vessels are prominent with rather rapid peripheral tapering, an appearance concerning for a degree of pulmonary arterial hypertension. No adenopathy. There is aortic atherosclerosis. No bone lesions. IMPRESSION: Cardiomegaly. Question a degree of pulmonary arterial hypertension. No edema or consolidation. Aortic Atherosclerosis (ICD10-I70.0). Electronically Signed   By: Bretta Bang III M.D.   On: 08/18/2018 08:36    ____________________________________________   PROCEDURES Procedures  ____________________________________________  DIFFERENTIAL DIAGNOSIS   COPD exacerbation, H CAP, influenza, dehydration, viral respiratory illness  CLINICAL IMPRESSION / ASSESSMENT AND PLAN / ED COURSE  Medications ordered in the ED: Medications  azithromycin (ZITHROMAX) 500 mg in sodium chloride 0.9 % 250 mL IVPB (500 mg Intravenous New Bag/Given 08/18/18 1002)  methylPREDNISolone sodium succinate (SOLU-MEDROL) 125 mg/2 mL injection 125 mg (125 mg Intravenous Given 08/18/18 0826)  ipratropium-albuterol (DUONEB) 0.5-2.5 (3) MG/3ML nebulizer solution 3 mL (3 mLs Nebulization Given 08/18/18 0828)  albuterol  (PROVENTIL) (2.5 MG/3ML) 0.083% nebulizer solution 5 mg (5 mg Nebulization Given 08/18/18 0828)  ceFEPIme (MAXIPIME) 2 g in sodium chloride 0.9 % 100 mL IVPB (0 g Intravenous Stopped 08/18/18 0911)  sodium chloride 0.9 % bolus 1,000 mL (0 mLs Intravenous Stopped 08/18/18 1000)  oseltamivir (TAMIFLU) capsule 75 mg (75 mg Oral Given 08/18/18 1025)    Pertinent labs & imaging results that were available during my care of the patient were reviewed by me and considered in my medical decision making (see chart for details).      Clinical Course as of Aug 19 1023  Wed Aug 18, 2018  0802 P/w sob, productive cough, SIRS concerning for sepsis from HCAP vs flu. Will start sepsis protocol, empiric vacomycin and cefepime. Solumedrol and nebs.    [PS]  0839 WBC(!): 3.2 [KL]  0940 Pro-cal low. Lactate normal. Labs at baseline except mild leukopenia.  Suspect viral respiratory illness. Will de-escalate abx to routin copd regimen, f/u flu. May still need hospitalization for observation and sx support of copd exac.   [PS]    Clinical Course User Index [KL] Eduard Clos, Student-PA [PS] Sharman Cheek, MD    ----------------------------------------- 10:24 AM on 08/18/2018 -----------------------------------------  Still very symptomatic although tachycardia has improved with IV fluids.  Influenza B positive.  Due to his comorbidities including underlying pulmonary disease, will plan to hospitalize for further management of COPD exacerbation.  At this point I highly doubt ACS PE dissection, patient is not septic.   ____________________________________________   FINAL CLINICAL IMPRESSION(S) / ED DIAGNOSES    Final diagnoses:  COPD exacerbation (HCC)  Influenza B     ED Discharge Orders    None      Portions of this note were generated with dragon dictation software. Dictation errors may occur despite best attempts at proofreading.   Sharman Cheek, MD 08/18/18 1025

## 2018-08-18 NOTE — Progress Notes (Addendum)
PHARMACIST - PHYSICIAN COMMUNICATION  CONCERNING:  Oseltamivir (Tamiflu) Renal Dose Adjustment    DESCRIPTION:  This patient has an order for Oseltamivir (Tamiflu) 75mg  BID x 5 days and has an Estimated Creatinine Clearance: 48.5 mL/min (A) (by C-G formula based on SCr of 1.85 mg/dL (H))..  The package insert for Oseltamivir recommends 30mg  BID x 5 days for patients with CrCl 30-60 ml/min.  RECOMMENDATION: Based on current CrCl <61ml/min, a renal dose adjustment to Oseltamivir 30mg  PO BID x 5 days has been made for your patient.    Gardner Candle, PharmD, BCPS Clinical Pharmacist 08/18/2018 2:01 PM

## 2018-08-18 NOTE — ED Triage Notes (Signed)
Pt arrives via ems from home with complaints of cough and shortness of breath that started last night.

## 2018-08-18 NOTE — Progress Notes (Signed)
Inpatient Diabetes Program Recommendations  AACE/ADA: New Consensus Statement on Inpatient Glycemic Control  Target Ranges:  Prepandial:   less than 140 mg/dL      Peak postprandial:   less than 180 mg/dL (1-2 hours)      Critically ill patients:  140 - 180 mg/dL   Results for ADGER, OSLUND (MRN [id number]) as of 08/18/2018 10:43  Ref. Range 08/18/2018 08:09  Glucose Latest Ref Range: 70 - 99 mg/dL 163 (H)  Results for KEISHON, SCHIFFNER (MRN [id number]) as of 08/18/2018 10:43  Ref. Range 06/14/2018 05:17  Hemoglobin A1C Latest Ref Range: 4.8 - 5.6 % 7.8 (H)   Review of Glycemic Control  Diabetes history: DM2 Outpatient Diabetes medications: None; was taking Metformin 1000 mg BID but it was discontinued as last hospital discharge on 07/31/18 due to kidney injury Current orders for Inpatient glycemic control: Novolog 0-15 units TID with meals; Solumedrol 60 mg Q24H  Inpatient Diabetes Program Recommendations:  Correction (SSI): Please consider ordering Novolog 0-5 units QHS for bedtime correction. HgbA1C: A1C 7.8% on 06/14/18. Noted patient was inpatient from 07/25/18 to 07/31/18 and he was taking Metformin 1000 mg BID but Metformin was discontinued on 07/31/18 due to acute kidney injury. Patient needs to follow up with PCP and likely needs to be taking or DM medication for DM control as an outpatient.  Thanks, Orlando Penner, RN, MSN, CDE Diabetes Coordinator Inpatient Diabetes Program [phone] (Team Pager from 8am to 5pm)

## 2018-08-18 NOTE — Progress Notes (Signed)
CODE SEPSIS - PHARMACY COMMUNICATION  **Broad Spectrum Antibiotics should be administered within 1 hour of Sepsis diagnosis**  Time Code Sepsis Called/Page Received: 0803  Antibiotics Ordered: Vancomycin, Cefepime  Time of 1st antibiotic administration: 0823  Additional action taken by pharmacy: N/a If necessary, Name of Provider/Nurse Contacted: N/A    Varick Keys ,PharmD Clinical Pharmacist  08/18/2018  8:35 AM

## 2018-08-18 NOTE — H&P (Signed)
[facility] [place] at [facility]   PATIENT NAME: James CluckSamuel Harrington    MR#:  [id number]  DATE OF BIRTH:  [birth year]  DATE OF ADMISSION:  08/18/2018  PRIMARY CARE PHYSICIAN: Administration, Veterans   REQUESTING/REFERRING PHYSICIAN: Dr. Scotty CourtStafford  CHIEF COMPLAINT:   Fever and body aches HISTORY OF PRESENT ILLNESS:  [name]  is a 63 y.o. male with a known history of CAD, chronic systolic heart failure with ejection fraction 30 to 35%, persistent atrial fibrillation on Xarelto, COPD and hypertension who presents today to the emergency room with nonproductive cough, shortness of breath and body aches that started yesterday. Patient has tested positive for influenza B today. Patient denies fever however does have chills and as mentioned body aches.  He also has some wheezing.  He had a fever when he arrived in the emergency room of 101.3.  He was also found to have tachycardia and tachypnea. He was in the hospital earlier this month for urinary tract infection/sepsis.   PAST MEDICAL HISTORY:   Past Medical History:  Diagnosis Date  . CAD in native artery    a. stress echo 12/2007 abnl, EF > 55%, b. LHC 01/28/08: mLAD 30, D1 40, dLCx 70, pRCA 30, mRCA 70, mRCA lesion 2 80, PDA 90, s/p PCI/BMS to prox and distal RCA, s/p PCI/BMS to PDA; c. patient reports PCI[facility] x 2 in early [facility] (no records on file) d. 06/2016: cath showing patent stents along RCA and LCx with moderate 40% stenosis along the LAD.   [name]. Carotid arterial disease (HCC)    a. 05/2018 CTA Head/neck: 80-90% stenosis @ L carotid bifurcation, extending into the prox LICA. 60% stenosis @ R carotid bifurcation. 50-75% bilateral distal cavernous carotid dzs.  . Chronic combined systolic (congestive) and diastolic (congestive) heart failure (HCC)    a. 2009 Echo: > 55%; b. 06/2016: EF 25-30%; c. 12/2017 Echo: EF 30-35%, diff HK; d. 05/2018 Echo: 30-35%, mild conc LVH, diff HK. Mild MR. Mildly dil LA/RA.  [name].  COPD (chronic obstructive pulmonary disease) (HCC)   . Diabetes mellitus without complication (HCC)   . Gout   . Hyperlipidemia   . Hypertensive heart disease   . Ischemic cardiomyopathy    a. 05/2018: 30-35%.  . Obesity   . Persistent atrial fibrillation 01/2017   a. cardioverted 9/18 to NSR; b. 12/2017 noted to be back in Afib-outpt dccv rec; c. CHA2DS2VASc = 6-->Xarelto.  . Stroke (cerebrum) (HCC)    a. 05/2018 MRI: small patchy acute to subacute cortial and white matter infarct in the bilat cerebrum. Mild chronic small vessel ischmia; b. 05/2018 Carotid U/S: L-carotid bifurcation 80-90% stenosed, R- carotid bifurcation 60% stenosed.  . Tobacco abuse     PAST SURGICAL HISTORY:   Past Surgical History:  Procedure Laterality Date  . CARDIAC CATHETERIZATION  2009   [facility];   . CARDIAC CATHETERIZATION  2010  . CARDIAC CATHETERIZATION N/A 07/28/2016   Procedure: Right and Left Heart Cath and possible PCI;  Surgeon: Iran OuchMuhammad A Arida, MD;  Location: [facility] INVASIVE CV LAB;  Service: Cardiovascular;  Laterality: N/A;  . CARDIOVERSION N/A 03/27/2017   Procedure: CARDIOVERSION;  Surgeon: Iran OuchArida, Muhammad A, MD;  Location: [facility] ORS;  Service: Cardiovascular;  Laterality: N/A;  . CARDIOVERSION N/A 07/30/2018   Procedure: CARDIOVERSION;  Surgeon: Antonieta IbaGollan, Timothy J, MD;  Location: [facility] ORS;  Service: Cardiovascular;  Laterality: N/A;  . CORONARY ANGIOPLASTY  2009   [facility].    SOCIAL  HISTORY:   Social History   Tobacco Use  . Smoking status: Former Smoker    Packs/day: 1.00    Years: 35.00    Pack years: 35.00    Types: Cigarettes  . Smokeless tobacco: Never Used  Substance Use Topics  . Alcohol use: Not Currently    Comment: Quit 8 yrs.  Used to drink heavily    FAMILY HISTORY:   Family History  Problem Relation Age of Onset  . Heart attack Mother 12  . Hypertension Mother   . Cancer Brother     DRUG ALLERGIES:   Allergies  Allergen Reactions  . Aspirin  Other (See Comments)    Told not to take because of blood thinner  . Ibuprofen Nausea And Vomiting  . Tylenol [Acetaminophen] Nausea And Vomiting    REVIEW OF SYSTEMS:   Review of Systems  Constitutional: Positive for malaise/fatigue. Negative for chills and fever.  HENT: Negative.  Negative for ear discharge, ear pain, hearing loss, nosebleeds and sore throat.   Eyes: Negative.  Negative for blurred vision and pain.  Respiratory: Positive for cough, shortness of breath and wheezing. Negative for hemoptysis.   Cardiovascular: Negative.  Negative for chest pain, palpitations and leg swelling.  Gastrointestinal: Negative.  Negative for abdominal pain, blood in stool, diarrhea, nausea and vomiting.  Genitourinary: Negative.  Negative for dysuria.  Musculoskeletal: Negative.  Negative for back pain.  Skin: Negative.   Neurological: Negative for dizziness, tremors, speech change, focal weakness, seizures and headaches.  Endo/Heme/Allergies: Negative.  Does not bruise/bleed easily.  Psychiatric/Behavioral: Negative.  Negative for depression, hallucinations and suicidal ideas.    MEDICATIONS AT HOME:   Prior to Admission medications   Medication Sig Start Date End Date Taking? Authorizing Provider  albuterol-ipratropium (COMBIVENT) 18-103 MCG/ACT inhaler Inhale 2 puffs into the lungs every 4 (four) hours as needed for wheezing.   Yes [provider]  amiodarone (PACERONE) 200 MG tablet Take 1 tablet (200 mg total) twice daily for 1 week, then 1 tablet (200 mg total) once daily thereafter. 08/10/18  Yes Dunn, Raymon Mutton, PA-C  atorvastatin (LIPITOR) 40 MG tablet Take 40 mg by mouth daily.    Yes [provider]  budesonide-formoterol (SYMBICORT) 160-4.5 MCG/ACT inhaler Inhale 2 puffs into the lungs 2 (two) times daily.   Yes [provider]  carvedilol (COREG) 6.25 MG tablet Take 1 tablet (6.25 mg total) by mouth 2 (two) times daily with a meal. 07/31/18  Yes Altamese Dilling, MD  docusate sodium (COLACE) 100 MG capsule Take 100 mg by mouth 2 (two) times daily.   Yes [provider]  isosorbide-hydrALAZINE (BIDIL) 20-37.5 MG tablet Take 1 tablet by mouth 3 (three) times daily. 08/10/18  Yes Dunn, Raymon Mutton, PA-C  nitroGLYCERIN (NITROSTAT) 0.4 MG SL tablet Place 0.4 mg under the tongue every 5 (five) minutes as needed for chest pain.   Yes [provider]  rivaroxaban (XARELTO) 20 MG TABS tablet Take 1 tablet (20 mg total) by mouth daily with supper. 02/19/17  Yes Adrian Saran, MD  spironolactone (ALDACTONE) 25 MG tablet Take 1 tablet (25 mg total) by mouth daily. 08/01/16  Yes Enedina Finner, MD  torsemide (DEMADEX) 20 MG tablet Take 2 tablets (40 mg total) by mouth 2 (two) times daily. 01/21/18  Yes Shaune Pollack, MD  traZODone (DESYREL) 50 MG tablet Take 50 mg by mouth at bedtime.   Yes [provider]      VITAL SIGNS:  Blood pressure (!) 157/93,  pulse 63, temperature (!) 101.3 F (38.5 C), temperature source Oral, resp. rate (!) 24, height 5\' 11"  (1.803 m), weight 91.6 kg, SpO2 98 %.  PHYSICAL EXAMINATION:   Physical Exam Constitutional:      General: He is not in acute distress. HENT:     Head: Normocephalic.  Eyes:     General: No scleral icterus. Neck:     Musculoskeletal: Normal range of motion and neck supple.     Vascular: No JVD.     Trachea: No tracheal deviation.  Cardiovascular:     Rate and Rhythm: Normal rate and regular rhythm.     Heart sounds: Normal heart sounds. No murmur. No friction rub. No gallop.   Pulmonary:     Effort: Pulmonary effort is normal. No respiratory distress.     Breath sounds: Examination of the right-upper field reveals wheezing. Examination of the left-upper field reveals wheezing. Examination of the right-middle field reveals wheezing. Examination of the left-middle field reveals wheezing. Examination of the right-lower field reveals wheezing. Examination of the left-lower field reveals  wheezing. Wheezing present. No rales.  Chest:     Chest wall: No tenderness.  Abdominal:     General: Bowel sounds are normal. There is no distension.     Palpations: Abdomen is soft. There is no mass.     Tenderness: There is no abdominal tenderness. There is no guarding or rebound.  Musculoskeletal: Normal range of motion.  Skin:    General: Skin is warm.     Findings: No erythema or rash.  Neurological:     Mental Status: He is alert and oriented to person, place, and time.  Psychiatric:        Judgment: Judgment normal.       LABORATORY PANEL:   CBC Recent Labs  Lab 08/18/18 0809  WBC 3.2*  HGB 16.0  HCT 51.1  PLT 157   ------------------------------------------------------------------------------------------------------------------  Chemistries  Recent Labs  Lab 08/18/18 0809  NA 135  K 3.9  CL 98  CO2 26  GLUCOSE 128*  BUN 28*  CREATININE 1.85*  CALCIUM 9.1  AST 40  ALT 36  ALKPHOS 262*  BILITOT 2.2*   ------------------------------------------------------------------------------------------------------------------  Cardiac Enzymes No results for input(s): TROPONINI in the last 168 hours. ------------------------------------------------------------------------------------------------------------------  RADIOLOGY:  Dg Chest Port 1 View  Result Date: 08/18/2018 CLINICAL DATA:  Cough and shortness of breath EXAM: PORTABLE CHEST 1 VIEW COMPARISON:  August 10, 2018 FINDINGS: There is no edema or consolidation. There is cardiomegaly. The central pulmonary vessels are prominent with rather rapid peripheral tapering, an appearance concerning for a degree of pulmonary arterial hypertension. No adenopathy. There is aortic atherosclerosis. No bone lesions. IMPRESSION: Cardiomegaly. Question a degree of pulmonary arterial hypertension. No edema or consolidation. Aortic Atherosclerosis (ICD10-I70.0). Electronically Signed   By: Bretta Bang III M.D.   On:  08/18/2018 08:36    EKG:  Sinus tachycardia no ST elevation or depression  IMPRESSION AND PLAN:   63 year old male with history of CAD, chronic systolic heart failure 30 to 35% ejection fraction, hypertension and COPD who presents today due to shortness of breath and body aches.  1.  Sepsis: Patient presents with fever, tachypnea and tachycardia.  Sepsis is due to influenza B.  2.  Influenza B: Continue Tamiflu for total of 5 days.   3.  Persistent atrial fibrillation: Patient currently sinus tachycardia Continue Xarelto  Continue Coreg and amiodarone for heart rate control  4.  Chronic systolic heart failure ejection fraction  30 referral 35%: Continue Aldactone, torsemide  5.  COPD with mild exacerbation due to influenza B: Continue inhalers and add Solu-Medrol 60 mg daily.  6.  Diabetes: Start sliding scale and ADA diet Diabetes nurse consultation Check A1c Metformin was discontinued during last hospital stay due to acute kidney injury  7.CAD: Continue atorvastatin, Coreg   All the records are reviewed and case discussed with ED provider. Management plans discussed with the patient and he is in agreement  CODE STATUS: FULL  TOTAL TIME TAKING CARE OF THIS PATIENT: 48 minutes.    Alisyn Lequire M.D on 08/18/2018 at 10:29 AM  Between 7am to 6pm - Pager - [phone]  After 6pm go to [email or web address] - Social research officer, governmentpassword [email or web address] [email or web address]  [facility] [place] [facility]  Office  [phone]  CC: Primary care physician; Administration, [employer]

## 2018-08-19 LAB — URINALYSIS, COMPLETE (UACMP) WITH MICROSCOPIC
Bilirubin Urine: NEGATIVE
Glucose, UA: 150 mg/dL — AB
Ketones, ur: NEGATIVE mg/dL
LEUKOCYTE UA: NEGATIVE
Nitrite: NEGATIVE
Protein, ur: 30 mg/dL — AB
Specific Gravity, Urine: 1.01 (ref 1.005–1.030)
pH: 5 (ref 5.0–8.0)

## 2018-08-19 LAB — GLUCOSE, CAPILLARY
GLUCOSE-CAPILLARY: 334 mg/dL — AB (ref 70–99)
Glucose-Capillary: 330 mg/dL — ABNORMAL HIGH (ref 70–99)
Glucose-Capillary: 271 mg/dL — ABNORMAL HIGH (ref 70–99)
Glucose-Capillary: 299 mg/dL — ABNORMAL HIGH (ref 70–99)
Glucose-Capillary: 252 mg/dL — ABNORMAL HIGH (ref 70–99)
Glucose-Capillary: 322 mg/dL — ABNORMAL HIGH (ref 70–99)

## 2018-08-19 LAB — COMPREHENSIVE METABOLIC PANEL
ALT: 30 U/L (ref 0–44)
AST: 36 U/L (ref 15–41)
Albumin: 3 g/dL — ABNORMAL LOW (ref 3.5–5.0)
Alkaline Phosphatase: 209 U/L — ABNORMAL HIGH (ref 38–126)
Anion gap: 11 (ref 5–15)
BILIRUBIN TOTAL: 1.7 mg/dL — AB (ref 0.3–1.2)
BUN: 39 mg/dL — ABNORMAL HIGH (ref 8–23)
CO2: 22 mmol/L (ref 22–32)
CREATININE: 2.05 mg/dL — AB (ref 0.61–1.24)
Calcium: 8.8 mg/dL — ABNORMAL LOW (ref 8.9–10.3)
Chloride: 103 mmol/L (ref 98–111)
GFR calc Af Amer: 39 mL/min — ABNORMAL LOW (ref >60)
GFR calc non Af Amer: 34 mL/min — ABNORMAL LOW (ref >60)
Glucose, Bld: 351 mg/dL — ABNORMAL HIGH (ref 70–99)
Potassium: 4 mmol/L (ref 3.5–5.1)
Sodium: 136 mmol/L (ref 135–145)
Total Protein: 7 g/dL (ref 6.5–8.1)

## 2018-08-19 LAB — HEMOGLOBIN A1C
Hgb A1c MFr Bld: 8.8 % — ABNORMAL HIGH (ref 4.8–5.6)
Mean Plasma Glucose: 206 mg/dL

## 2018-08-19 MED ORDER — LINAGLIPTIN 5 MG PO TABS
5.0000 mg | ORAL_TABLET | Freq: Every day | ORAL | Status: DC
  Administered 2018-08-19 – 2018-08-20 (×2): 5 mg via ORAL
  Filled 2018-08-19 (×2): qty 1

## 2018-08-19 MED ORDER — RIVAROXABAN 15 MG PO TABS
15.0000 mg | ORAL_TABLET | Freq: Every day | ORAL | Status: DC
  Administered 2018-08-19: 15 mg via ORAL
  Filled 2018-08-19 (×2): qty 1

## 2018-08-19 MED ORDER — INSULIN ASPART 100 UNIT/ML ~~LOC~~ SOLN
12.0000 [IU] | Freq: Once | SUBCUTANEOUS | Status: AC
  Administered 2018-08-19: 12 [IU] via SUBCUTANEOUS
  Filled 2018-08-19: qty 1

## 2018-08-19 MED ORDER — INSULIN ASPART 100 UNIT/ML ~~LOC~~ SOLN: 0.0000 [IU] | Freq: Three times a day (TID) | SUBCUTANEOUS | Status: DC

## 2018-08-19 MED ORDER — IPRATROPIUM-ALBUTEROL 0.5-2.5 (3) MG/3ML IN SOLN
3.0000 mL | Freq: Three times a day (TID) | RESPIRATORY_TRACT | Status: DC
  Administered 2018-08-19 – 2018-08-20 (×2): 3 mL via RESPIRATORY_TRACT
  Filled 2018-08-19 (×2): qty 3

## 2018-08-19 MED ORDER — ALBUTEROL SULFATE (2.5 MG/3ML) 0.083% IN NEBU: 2.5000 mg | INHALATION_SOLUTION | Freq: Four times a day (QID) | RESPIRATORY_TRACT | Status: DC | PRN

## 2018-08-19 NOTE — Care Management Note (Signed)
Case Management Note  Patient Details  Name: [name] MRN: [id number] Date of Birth: 03/02/1956  Subjective/Objective: Admitted to [facility] with shortness of breath. Lives with wife. States he sees a cardiologist at [facility] in [place].  [facility] in the past.                   Action/Plan: Refusal to transfer to [facility] signed and faxed to [facility]. List of physicians accepting new patients given to [name].    Expected Discharge Date:                  Expected Discharge Plan:     In-House Referral:   yes  Discharge planning Services   yes  Post Acute Care Choice:    Choice offered to:     DME Arranged:    DME Agency:     HH Arranged:    HH Agency:     Status of Service:     If discussed at [employer] of Stay Meetings, dates discussed:    Additional Comments:  Gwenette Greet, RN MSN CCM Care Management [phone] 08/19/2018, 3:28 PM

## 2018-08-19 NOTE — Progress Notes (Signed)
[facility] - [place] at [facility]   PATIENT NAME: [name]    MR#:  [id number]  DATE OF BIRTH:  [birth year]  SUBJECTIVE:   Came in with fever shortness of breath and was found to have flu test positive. Feels a lot better. Off oxygen. No family in the room. REVIEW OF SYSTEMS:   Review of Systems  Constitutional: Negative for chills, fever and weight loss.  HENT: Negative for ear discharge, ear pain and nosebleeds.   Eyes: Negative for blurred vision, pain and discharge.  Respiratory: Negative for sputum production, shortness of breath, wheezing and stridor.   Cardiovascular: Negative for chest pain, palpitations, orthopnea and PND.  Gastrointestinal: Negative for abdominal pain, diarrhea, nausea and vomiting.  Genitourinary: Negative for frequency and urgency.  Musculoskeletal: Negative for back pain and joint pain.  Neurological: Positive for weakness. Negative for sensory change, speech change and focal weakness.  Psychiatric/Behavioral: Negative for depression and hallucinations. The patient is not nervous/anxious.    Tolerating Diet:yes Tolerating PT: not needed  DRUG ALLERGIES:   Allergies  Allergen Reactions  . Aspirin Other (See Comments)    Told not to take because of blood thinner  . Ibuprofen Nausea And Vomiting  . Tylenol [Acetaminophen] Nausea And Vomiting    VITALS:  Blood pressure 140/90, pulse 79, temperature 97.7 F (36.5 C), temperature source Oral, resp. rate 16, height 5\' 11"  (1.803 m), weight 91.6 kg, SpO2 99 %.  PHYSICAL EXAMINATION:   Physical Exam  GENERAL:  63 y.o.-year-old patient lying in the bed with no acute distress.  EYES: Pupils equal, round, reactive to light and accommodation. No scleral icterus. Extraocular muscles intact.  HEENT: Head atraumatic, normocephalic. Oropharynx and nasopharynx clear.  NECK:  Supple, no jugular venous distention. No thyroid enlargement, no tenderness.  LUNGS: Normal breath  sounds bilaterally, no wheezing, rales, rhonchi. No use of accessory muscles of respiration.  CARDIOVASCULAR: S1, S2 normal. No murmurs, rubs, or gallops.  ABDOMEN: Soft, nontender, nondistended. Bowel sounds present. No organomegaly or mass.  EXTREMITIES: No cyanosis, clubbing or edema b/l.    NEUROLOGIC: Cranial nerves II through XII are intact. No focal Motor or sensory deficits b/l.   PSYCHIATRIC:  patient is alert and oriented x 3.  SKIN: No obvious rash, lesion, or ulcer.   LABORATORY PANEL:  CBC Recent Labs  Lab 08/18/18 0809  WBC 3.2*  HGB 16.0  HCT 51.1  PLT 157    Chemistries  Recent Labs  Lab 08/19/18 0539  NA 136  K 4.0  CL 103  CO2 22  GLUCOSE 351*  BUN 39*  CREATININE 2.05*  CALCIUM 8.8*  AST 36  ALT 30  ALKPHOS 209*  BILITOT 1.7*   Cardiac Enzymes No results for input(s): TROPONINI in the last 168 hours. RADIOLOGY:  Dg Chest Port 1 View  Result Date: 08/18/2018 CLINICAL DATA:  Cough and shortness of breath EXAM: PORTABLE CHEST 1 VIEW COMPARISON:  August 10, 2018 FINDINGS: There is no edema or consolidation. There is cardiomegaly. The central pulmonary vessels are prominent with rather rapid peripheral tapering, an appearance concerning for a degree of pulmonary arterial hypertension. No adenopathy. There is aortic atherosclerosis. No bone lesions. IMPRESSION: Cardiomegaly. Question a degree of pulmonary arterial hypertension. No edema or consolidation. Aortic Atherosclerosis (ICD10-I70.0). Electronically Signed   By: Bretta Bang III M.D.   On: 08/18/2018 08:36   ASSESSMENT AND PLAN:  63 year old male with history of CAD, chronic systolic heart failure 30 to 35% ejection fraction, hypertension  and COPD who presents today due to shortness of breath and body aches.  1.  Sepsis: Patient presents with fever, tachypnea and tachycardia.  Sepsis is due to influenza B.-Patient feels a lot better. Sepsis resolved.  2.  Influenza B: Continue Tamiflu for  total of 5 days.  3.  Persistent atrial fibrillation:  Continue Xarelto  Continue Coreg and amiodarone for heart rate control  4.  Chronic systolic heart failure ejection fraction 30 referral 35%: Continue Aldactone, torsemide -patient is not in heart failure.  5.  COPD with mild exacerbation due to influenza B: Continue inhalers  -currently not wheezing discontinue steroids  6.  Diabetes: Start sliding scale and ADA diet Diabetes nurse consultation A1c 8.8 in feb 2020 Metformin was discontinued during last hospital stay due to acute kidney injury -discussed with pharmacy to start linagliptins  7.CAD: Continue atorvastatin, Coreg  CODE STATUS: full  DVT Prophylaxis:xarelto if continues to show improvement will discharge patient home tomorrow. Patient agreeable.  TOTAL TIME TAKING CARE OF THIS PATIENT: *25* minutes.  >50% time spent on counselling and coordination of care  POSSIBLE D/C IN *1-2* DAYS, DEPENDING ON CLINICAL CONDITION.  Note: This dictation was prepared with Dragon dictation along with smaller phrase technology. Any transcriptional errors that result from this process are unintentional.  Enedina Finner M.D on 08/19/2018 at 2:44 PM  Between 7am to 6pm - Pager - [phone]  After 6pm go to [email or web address] - Social research officer, government  [facility] [place] [facility]  Office  [phone]  CC: Primary care physician; Administration, VeteransPatient ID: [name], male   DOB: [birth year], 63 y.o.   MRN: [id number]

## 2018-08-19 NOTE — Progress Notes (Addendum)
Inpatient Diabetes Program Recommendations  AACE/ADA: New Consensus Statement on Inpatient Glycemic Control  Target Ranges:  Prepandial:   less than 140 mg/dL      Peak postprandial:   less than 180 mg/dL (1-2 hours)      Critically ill patients:  140 - 180 mg/dL   Results for [name], [name] (MRN [id number]) as of 08/19/2018 07:52  Ref. Range 08/18/2018 12:56 08/18/2018 17:29 08/18/2018 21:10 08/19/2018 02:20  Glucose-Capillary Latest Ref Range: 70 - 99 mg/dL 469 (H) 507 (H) 225 (H) 330 (H)   Results for [name], [name] (MRN [id number]) as of 08/19/2018 07:52  Ref. Range 08/19/2018 05:39  Glucose Latest Ref Range: 70 - 99 mg/dL 825 (H)   Results for [name], [name] (MRN [id number]) as of 08/19/2018 07:52  Ref. Range 06/14/2018 05:17 08/18/2018 08:09  Hemoglobin A1C Latest Ref Range: 4.8 - 5.6 % 7.8 (H) 8.8 (H)   Review of Glycemic Control  Diabetes history: DM2 Outpatient Diabetes medications: None; was taking Metformin 1000 mg BID but it was discontinued as last hospital discharge on 07/31/18 due to kidney injury Current orders for Inpatient glycemic control: Novolog 0-15 units TID with meals; Solumedrol 60 mg Q24H  Inpatient Diabetes Program Recommendations:   Insulin-Basal: If steroids will be continued, please consider ordering Lantus 10 units Q24H.  Correction (SSI): Please consider ordering Novolog 0-5 units QHS for bedtime correction.  Insulin-Meal Coverage: If steroids are continued, please consider ordering Novolog 4 units TID with meals for meal coverage if patient eats at least 50% of meals.  HgbA1C: A1C 7.8% on 06/14/18 and current A1C 8.8% on 08/18/18 indicating an average glucose of 206 mg/dl. Noted patient was inpatient from 07/25/18 to 07/31/18 and he was taking Metformin 1000 mg BID but Metformin was discontinued on 07/31/18 due to acute kidney injury. Patient needs to follow up with PCP and would recommend discharging on Tradjenta 5 mg daily (has lower risk of hypoglycemia and is  eliminated via feces).   Addendum 08/19/18@10 :10-Spoke with patient and his wife about diabetes and home regimen for diabetes control. Patient reports that he is followed by [facility] for diabetes management and he wife states that she would like to find a [place] provider that is closer to home. Patient's wife reports that [facility] take DM medication but the provider has not gotten back with her.  Patient confirms that he stopped Metformin as directed after last hospital discharge.  Patient's wife states that she has been checking glucose in the morning and that it has been in the mid to upper 100's lately. She does not ever check glucose in the middle of the day or evening. Encouraged patient to check glucose at different times of the day as glucose is likely higher during the day and at night.   Discussed A1C results (8.8% on 08/18/18) and explained that his current A1C indicates an average glucose of 206 mg/dl over the past 2-3 months. Discussed glucose and A1C goals. Discussed importance of checking CBGs and maintaining good CBG control to prevent long-term and short-term complications. Explained that patient will likely need to take DM medication as an outpatient but due to kidney issues last month, Metformin is not recommended. Informed patient that recommendations for outpatient DM medication would be made and if attending doctor is agreeable, they may discharge him on a different DM medication.  Patient is agreeable to take a different DM medication as an outpatient. Encouraged patient to follow up with  provider at [facility] regarding DM control.  Patient verbalized understanding of information discussed and he states that he has no further questions at this time related to diabetes.  Thanks, Orlando Penner, RN, MSN, CDE Diabetes Coordinator Inpatient Diabetes Program [phone] (Team Pager from 8am to 5pm)

## 2018-08-19 NOTE — Progress Notes (Signed)
Patient's CBG is 337.  Patient has order for AC/HS CBG but no insulin order.  Message sent to Dr. Sheryle Hail to make him aware and now awaiting orders.  Orson Ape, BSN

## 2018-08-20 LAB — URINE CULTURE: Culture: NO GROWTH

## 2018-08-20 LAB — GLUCOSE, CAPILLARY
Glucose-Capillary: 169 mg/dL — ABNORMAL HIGH (ref 70–99)
Glucose-Capillary: 216 mg/dL — ABNORMAL HIGH (ref 70–99)
Glucose-Capillary: 307 mg/dL — ABNORMAL HIGH (ref 70–99)

## 2018-08-20 MED ORDER — INSULIN REGULAR HUMAN 100 UNIT/ML IJ SOLN
10.0000 [IU] | Freq: Once | INTRAMUSCULAR | Status: AC
  Administered 2018-08-20: 10 [IU] via INTRAVENOUS
  Filled 2018-08-20: qty 10

## 2018-08-20 MED ORDER — OSELTAMIVIR PHOSPHATE 30 MG PO CAPS: 30.0000 mg | ORAL_CAPSULE | Freq: Two times a day (BID) | ORAL | 0 refills | Status: DC

## 2018-08-20 MED ORDER — LINAGLIPTIN 5 MG PO TABS: 5.0000 mg | ORAL_TABLET | Freq: Every day | ORAL | 2 refills | Status: DC

## 2018-08-20 MED ORDER — TORSEMIDE 20 MG PO TABS: 20.0000 mg | ORAL_TABLET | Freq: Two times a day (BID) | ORAL | 0 refills | Status: DC

## 2018-08-20 MED ORDER — RIVAROXABAN 15 MG PO TABS: 15.0000 mg | ORAL_TABLET | Freq: Every day | ORAL | 1 refills | Status: DC

## 2018-08-20 NOTE — Discharge Instructions (Signed)
Check your blood sugars as before

## 2018-08-20 NOTE — Care Management (Signed)
[facility] Discount [facility] Card for Oseltamivir 10 capsules of 30mg  po. This prescription will cost $25.17 at [facility].  Discharge to home today per Dr. Enedina Finner Family/friend will transport Gwenette Greet RN MSN CCM Care Management [phone]

## 2018-08-20 NOTE — Discharge Summary (Addendum)
[facility] [place] [facility]   PATIENT NAME: [name]    MR#:  [id number]  DATE OF BIRTH:  [birth year]  DATE OF ADMISSION:  08/18/2018 ADMITTING PHYSICIAN: Adrian Saran, MD  DATE OF DISCHARGE: 08/20/2018  PRIMARY CARE PHYSICIAN: [facility], [facility]    ADMISSION DIAGNOSIS:  Influenza B [J10.1] COPD exacerbation (HCC) [J44.1]  DISCHARGE DIAGNOSIS:  sepsis resolved influenza B SECONDARY DIAGNOSIS:   Past Medical History:  Diagnosis Date  . CAD in native artery    a. stress echo 12/2007 abnl, EF > 55%, b. LHC 01/28/08: mLAD 30, D1 40, dLCx 70, pRCA 30, mRCA 70, mRCA lesion 2 80, PDA 90, s/p PCI/BMS to prox and distal RCA, s/p PCI/BMS to PDA; c. patient reports PCI/stenting x 2 in early 2017 at the [place] (no records on file) d. 06/2016: cath showing patent stents along RCA and LCx with moderate 40% stenosis along the LAD.   [name] Carotid arterial disease (HCC)    a. 05/2018 CTA Head/neck: 80-90% stenosis @ L carotid bifurcation, extending into the prox LICA. 60% stenosis @ R carotid bifurcation. 50-75% bilateral distal cavernous carotid dzs.  . Chronic combined systolic (congestive) and diastolic (congestive) heart failure (HCC)    a. 2009 Echo: > 55%; b. 06/2016: EF 25-30%; c. 12/2017 Echo: EF 30-35%, diff HK; d. 05/2018 Echo: 30-35%, mild conc LVH, diff HK. Mild MR. Mildly dil LA/RA.  [name] COPD (chronic obstructive pulmonary disease) (HCC)   . Diabetes mellitus without complication (HCC)   . Gout   . Hyperlipidemia   . Hypertensive heart disease   . Ischemic cardiomyopathy    a. 05/2018: 30-35%.  . Obesity   . Persistent atrial fibrillation 01/2017   a. cardioverted 9/18 to NSR; b. 12/2017 noted to be back in Afib-outpt dccv rec; c. CHA2DS2VASc = 6-->Xarelto.  . Stroke (cerebrum) (HCC)    a. 05/2018 MRI: small patchy acute to subacute cortial and white matter infarct in the bilat cerebrum. Mild chronic small vessel ischmia; b. 05/2018 Carotid U/S:  L-carotid bifurcation 80-90% stenosed, R- carotid bifurcation 60% stenosed.  . Tobacco abuse     HOSPITAL COURSE:  63 year old male with history of CAD, chronic systolic heart failure 30 to 35% ejection fraction, hypertension and COPD who presents today due to shortness of breath and body aches.  1. Sepsis: Patient presents with fever, tachypnea and tachycardia. Sepsis is due to influenza B.-Patient feels a lot better. Sepsis resolved.  2. Influenza B: Continue Tamiflu for total of 5 days. Patient is given good RX coupon. It will cost him $25 from [employer].  3. Persistent atrial fibrillation:  Continue Xarelto  on Coreg and amiodarone for heart rate control  4. Chronic systolic heart failure ejection fraction 30 referral 35%: Continue Aldactone, torsemide -patient is not in heart failure.  5. COPD with mild exacerbation due to influenza B: Continue inhalers  -currently not wheezing discontinue steroids  6. Diabetes: Start sliding scale and ADA diet Diabetes nurse consultation A1c 8.8 in feb 2020 Metforminwas discontinued during last hospital stay due to acute kidney injury -discussed with pharmacy to start linagliptins--rx sent to [facility]  7.CAD:Continue atorvastatin, Coreg  Discharge patient home. Patient agreeable.  CONSULTS OBTAINED:    DRUG ALLERGIES:   Allergies  Allergen Reactions  . Aspirin Other (See Comments)    Told not to take because of blood thinner  . Ibuprofen Nausea And Vomiting  . Tylenol [Acetaminophen] Nausea And Vomiting    DISCHARGE MEDICATIONS:   Allergies as  of 08/20/2018      Reactions   Aspirin Other (See Comments)   Told not to take because of blood thinner   Ibuprofen Nausea And Vomiting   Tylenol [acetaminophen] Nausea And Vomiting      Medication List    TAKE these medications   amiodarone 200 MG tablet Commonly known as:  PACERONE Take 1 tablet (200 mg total) twice daily for 1 week, then 1 tablet (200 mg total)  once daily thereafter.   atorvastatin 40 MG tablet Commonly known as:  LIPITOR Take 40 mg by mouth daily.   budesonide-formoterol 160-4.5 MCG/ACT inhaler Commonly known as:  SYMBICORT Inhale 2 puffs into the lungs 2 (two) times daily.   carvedilol 6.25 MG tablet Commonly known as:  COREG Take 1 tablet (6.25 mg total) by mouth 2 (two) times daily with a meal.   COMBIVENT 18-103 MCG/ACT inhaler Generic drug:  albuterol-ipratropium Inhale 2 puffs into the lungs every 4 (four) hours as needed for wheezing.   docusate sodium 100 MG capsule Commonly known as:  COLACE Take 100 mg by mouth 2 (two) times daily.   isosorbide-hydrALAZINE 20-37.5 MG tablet Commonly known as:  BIDIL Take 1 tablet by mouth 3 (three) times daily.   linagliptin 5 MG Tabs tablet Commonly known as:  TRADJENTA Take 1 tablet (5 mg total) by mouth daily. Start taking on:  August 21, 2018   nitroGLYCERIN 0.4 MG SL tablet Commonly known as:  NITROSTAT Place 0.4 mg under the tongue every 5 (five) minutes as needed for chest pain.   oseltamivir 30 MG capsule Commonly known as:  TAMIFLU Take 1 capsule (30 mg total) by mouth 2 (two) times daily.   Rivaroxaban 15 MG Tabs tablet Commonly known as:  XARELTO Take 1 tablet (15 mg total) by mouth daily with supper. What changed:    medication strength  how much to take   spironolactone 25 MG tablet Commonly known as:  ALDACTONE Take 1 tablet (25 mg total) by mouth daily.   torsemide 20 MG tablet Commonly known as:  DEMADEX Take 1 tablet (20 mg total) by mouth 2 (two) times daily. What changed:  how much to take   traZODone 50 MG tablet Commonly known as:  DESYREL Take 50 mg by mouth at bedtime.       If you experience worsening of your admission symptoms, develop shortness of breath, life threatening emergency, suicidal or homicidal thoughts you must seek medical attention immediately by calling 911 or calling your MD immediately  if symptoms less  severe.  You Must read complete instructions/literature along with all the possible adverse reactions/side effects for all the Medicines you take and that have been prescribed to you. Take any new Medicines after you have completely understood and accept all the possible adverse reactions/side effects.   Please note  You were cared for by a hospitalist during your hospital stay. If you have any questions about your discharge medications or the care you received while you were in the hospital after you are discharged, you can call the unit and asked to speak with the hospitalist on call if the hospitalist that took care of you is not available. Once you are discharged, your primary care physician will handle any further medical issues. Please note that NO REFILLS for any discharge medications will be authorized once you are discharged, as it is imperative that you return to your primary care physician (or establish a relationship with a primary care physician if you do  not have one) for your aftercare needs so that they can reassess your need for medications and monitor your lab values. Today   SUBJECTIVE   Denies any complaints.  VITAL SIGNS:  Blood pressure (!) 131/91, pulse 75, temperature 98 F (36.7 C), resp. rate 18, height 5\' 11"  (1.803 m), weight 91.6 kg, SpO2 100 %.  I/O:    Intake/Output Summary (Last 24 hours) at 08/20/2018 0932 Last data filed at 08/19/2018 1006 Gross per 24 hour  Intake -  Output 300 ml  Net -300 ml    PHYSICAL EXAMINATION:  GENERAL:  63 y.o.-year-old patient lying in the bed with no acute distress.  EYES: Pupils equal, round, reactive to light and accommodation. No scleral icterus. Extraocular muscles intact.  HEENT: Head atraumatic, normocephalic. Oropharynx and nasopharynx clear.  NECK:  Supple, no jugular venous distention. No thyroid enlargement, no tenderness.  LUNGS: Normal breath sounds bilaterally, no wheezing, rales,rhonchi or crepitation. No use  of accessory muscles of respiration.  CARDIOVASCULAR: S1, S2 normal. No murmurs, rubs, or gallops.  ABDOMEN: Soft, non-tender, non-distended. Bowel sounds present. No organomegaly or mass.  EXTREMITIES: + pedal edema,no cyanosis, or clubbing.  NEUROLOGIC: Cranial nerves II through XII are intact. Muscle strength 5/5 in all extremities. Sensation intact. Gait not checked.  PSYCHIATRIC: The patient is alert and oriented x 3.  SKIN: No obvious rash, lesion, or ulcer.   DATA REVIEW:   CBC  Recent Labs  Lab 08/18/18 0809  WBC 3.2*  HGB 16.0  HCT 51.1  PLT 157    Chemistries  Recent Labs  Lab 08/19/18 0539  NA 136  K 4.0  CL 103  CO2 22  GLUCOSE 351*  BUN 39*  CREATININE 2.05*  CALCIUM 8.8*  AST 36  ALT 30  ALKPHOS 209*  BILITOT 1.7*    Microbiology Results   Recent Results (from the past 240 hour(s))  Blood Culture (routine x 2)     Status: None (Preliminary result)   Collection Time: 08/18/18  8:09 AM  Result Value Ref Range Status   Specimen Description BLOOD BLOOD RIGHT HAND  Final   Special Requests   Final    BOTTLES DRAWN AEROBIC AND ANAEROBIC Blood Culture results may not be optimal due to an inadequate volume of blood received in culture bottles   Culture   Final    NO GROWTH 2 DAYS Performed at [facility], [address] [place], [place] [address]    Report Status PENDING  Incomplete  Blood Culture (routine x 2)     Status: None (Preliminary result)   Collection Time: 08/18/18  8:10 AM  Result Value Ref Range Status   Specimen Description BLOOD BLOOD LEFT ARM  Final   Special Requests   Final    BOTTLES DRAWN AEROBIC AND ANAEROBIC Blood Culture adequate volume   Culture   Final    NO GROWTH 2 DAYS Performed at [facility], [address] [place], [place] [address]    Report Status PENDING  Incomplete  Urine culture     Status: None   Collection Time: 08/19/18  3:29 AM  Result Value Ref Range Status   Specimen Description    Final    URINE, RANDOM Performed at [name], [address] [place], [place] [address]    Special Requests   Final    NONE Performed at [facility], [address] [place], [place] [address]    Culture   Final  NO GROWTH Performed at [facility], [address] [place], [place] [address]    Report Status 08/20/2018 FINAL  Final    RADIOLOGY:  No results found.   CODE STATUS:     Code Status Orders  (From admission, onward)         Start     Ordered   08/18/18 1027  Full code  Continuous     08/18/18 1028        Code Status History    Date Active Date Inactive Code Status Order ID Comments User Context   07/26/2018 1207 07/31/2018 1751 Partial Code [id number]  Erin FullingKasa, Kurian, MD Inpatient   07/25/2018 2020 07/26/2018 1207 Full Code [id number]  Gery Prayrosley, Debby, MD Inpatient   06/13/2018 2049 06/15/2018 1706 Full Code [id number]  Gery Prayrosley, Debby, MD Inpatient   01/19/2018 2358 01/21/2018 1343 Full Code [id number]  Cammy CopaMaier, Angela, MD ED   03/26/2017 0636 03/27/2017 1729 Full Code [id number]  Arnaldo Nataliamond, Michael S, MD Inpatient   02/18/2017 2112 02/19/2017 1707 Full Code [id number]  Houston SirenSainani, Vivek J, MD Inpatient   01/05/2017 2246 01/07/2017 1614 Full Code [id number]  Altamese DillingVachhani, Vaibhavkumar, MD Inpatient   07/24/2016 0235 07/31/2016 1401 Full Code [id number]  Oralia ManisWillis, David, MD Inpatient      TOTAL TIME TAKING CARE OF THIS PATIENT: *40* minutes.    Enedina FinnerSona Micah Galeno M.D on 08/20/2018 at 9:32 AM  Between 7am to 6pm - Pager - [phone] After 6pm go to [email or web address] - Social research officer, governmentpassword [email or web address]  [facility] [place] [facility]  Office  [phone]  CC: Primary care physician; Administration, [employer]

## 2018-08-20 NOTE — Progress Notes (Signed)
Pt for discharge home. Alert no resp distress.  Sl d/cdx2 . Instructions discussed with pt and wife. presc given and discussed and home meds discussed.  Diet/ activity and f/u discussed.  Verbalize understanding. Home via w/c with wife.

## 2018-08-20 NOTE — Plan of Care (Signed)

## 2018-08-22 NOTE — Progress Notes (Signed)
PHARMACY - PHYSICIAN COMMUNICATION CRITICAL VALUE ALERT - BLOOD CULTURE IDENTIFICATION (BCID)  [name] is an 63 y.o. male who presented to [facility] on 08/18/2018 with a chief complaint of   Assessment:  Gram+ rods (include suspected source if known)  Name of physician (or Provider) Contacted: Sent message to [place], [place].  Current antibiotics: discharged  Changes to prescribed antibiotics recommended:    No results found for this or any previous visit.  [name] 08/22/2018  5:51 AM

## 2018-08-23 LAB — CULTURE, BLOOD (ROUTINE X 2)
CULTURE: NO GROWTH
Special Requests: ADEQUATE

## 2018-08-24 ENCOUNTER — Ambulatory Visit: Payer: Self-pay | Admitting: Family

## 2018-09-01 NOTE — Progress Notes (Signed)
Patient ID: [name], male    DOB: April 23, 1956, 63 y.o.   MRN: [id number]  HPI  [name] is a 63 y/o male with a history of CAD, DM, hyperlipidemia, HTN, CKD, stroke, COPD, gout, atrial fibrillation, previous tobacco use and chronic heart failure.   Echo report from 06/14/18 reviewed and showed an EF of 30-35% along with mild James.  Catheterization done 07/28/16 and showed:  Prox RCA lesion, 0 %stenosed.  Mid RCA lesion, 10 %stenosed.  Mid Cx lesion, 0 %stenosed.  Mid Cx to Dist Cx lesion, 0 %stenosed.  Prox Cx lesion, 40 %stenosed.  Prox LAD lesion, 40 %stenosed.  Mid LAD lesion, 40 %stenosed.  Dist LAD lesion, 30 %stenosed.  LV end diastolic pressure is severely elevated. 1. Patent stents in the right coronary artery and left circumflex with no significant restenosis. Moderate LAD disease.  2. Right heart catheterization showed severely elevated filling pressures and severe pulmonary hypertension. RV pressure was 82/18, PDA 81/43 with a mean of 57. RA pressure was 11. Left ventricular end-diastolic pressure was 34 mmHg. Pulmonary capillary wedge pressure could not be obtained due to inability to advance the Swan-Ganz catheter. Cardiac output is 4.44 with a cardiac index of 1.94.  Admitted 08/18/2018 due to sepsis due to influenza. Treated with tamiflu and discharged after 2 days. Admitted 07/25/2018 due to sepsis due to UTI. Nephrology and cardiology consults were obtained. Given antibiotics. Given IV lasix with resultant >3L fluid loss and then transitioned to oral diuretics. Needed IV fluids due to acute kidney injury. Discharged after 6 days.   He presents today for his initial visit with a chief complaint of moderate shortness of breath upon minimal exertion. He describes this as chronic in nature having been present for several years. He has associated fatigue, cough, rhinorrhea and back pain along with this. He denies any difficulty sleeping, dizziness, abdominal distention,  palpitations, pedal edema, chest pain or weight gain. Has to be in [place] today for a PCP appointment at the [place] in 1 hour.   Past Medical History:  Diagnosis Date  . CAD in native artery    a. stress echo 12/2007 abnl, EF > 55%, b. LHC 01/28/08: mLAD 30, D1 40, dLCx 70, pRCA 30, mRCA 70, mRCA lesion 2 80, PDA 90, s/p PCI/BMS to prox and distal RCA, s/p PCI/BMS to PDA; c. patient reports PCI/stenting x 2 in early 2017 at the [place] (no records on file) d. 06/2016: cath showing patent stents along RCA and LCx with moderate 40% stenosis along the LAD.   [name] Carotid arterial disease (HCC)    a. 05/2018 CTA Head/neck: 80-90% stenosis @ L carotid bifurcation, extending into the prox LICA. 60% stenosis @ R carotid bifurcation. 50-75% bilateral distal cavernous carotid dzs.  . Chronic combined systolic (congestive) and diastolic (congestive) heart failure (HCC)    a. 2009 Echo: > 55%; b. 06/2016: EF 25-30%; c. 12/2017 Echo: EF 30-35%, diff HK; d. 05/2018 Echo: 30-35%, mild conc LVH, diff HK. Mild James. Mildly dil LA/RA.  [name] Chronic kidney disease   . COPD (chronic obstructive pulmonary disease) (HCC)   . Diabetes mellitus without complication (HCC)   . Gout   . Hyperlipidemia   . Hypertension   . Hypertensive heart disease   . Ischemic cardiomyopathy    a. 05/2018: 30-35%.  . Obesity   . Persistent atrial fibrillation 01/2017   a. cardioverted 9/18 to NSR; b. 12/2017 noted to be back in Afib-outpt dccv rec; c. CHA2DS2VASc = 6-->Xarelto.  [name]  Stroke (cerebrum) (HCC)    a. 05/2018 MRI: small patchy acute to subacute cortial and white matter infarct in the bilat cerebrum. Mild chronic small vessel ischmia; b. 05/2018 Carotid U/S: L-carotid bifurcation 80-90% stenosed, R- carotid bifurcation 60% stenosed.  . Tobacco abuse    Past Surgical History:  Procedure Laterality Date  . CARDIAC CATHETERIZATION  2009   [facility];   . CARDIAC CATHETERIZATION  2010  . CARDIAC CATHETERIZATION N/A 07/28/2016   Procedure: Right and  Left Heart Cath and possible PCI;  Surgeon: Iran Ouch, MD;  Location: [facility] INVASIVE CV LAB;  Service: Cardiovascular;  Laterality: N/A;  . CARDIOVERSION N/A 03/27/2017   Procedure: CARDIOVERSION;  Surgeon: Iran Ouch, MD;  Location: [facility] ORS;  Service: Cardiovascular;  Laterality: N/A;  . CARDIOVERSION N/A 07/30/2018   Procedure: CARDIOVERSION;  Surgeon: Antonieta Iba, MD;  Location: [facility] ORS;  Service: Cardiovascular;  Laterality: N/A;  . CORONARY ANGIOPLASTY  2009   s/p stent placement at [facility].   Family History  Problem Relation Age of Onset  . Heart attack Mother 47  . Hypertension Mother   . Cancer Brother    Social History   Tobacco Use  . Smoking status: Former Smoker    Packs/day: 1.00    Years: 35.00    Pack years: 35.00    Types: Cigarettes  . Smokeless tobacco: Never Used  Substance Use Topics  . Alcohol use: Not Currently    Comment: Quit 8 yrs.  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 Aspirin Other (See Comments)    Told not to take because of blood thinner  . Ibuprofen Nausea And Vomiting  . Tylenol [Acetaminophen] Nausea And Vomiting   Prior to Admission medications   Medication Sig Start Date End Date Taking? Authorizing Provider  albuterol-ipratropium (COMBIVENT) 18-103 MCG/ACT inhaler Inhale 2 puffs into the lungs every 4 (four) hours as needed for wheezing.   Yes [provider]  amiodarone (PACERONE) 200 MG tablet Take 1 tablet (200 mg total) twice daily for 1 week, then 1 tablet (200 mg total) once daily thereafter. 08/10/18  Yes Dunn, Raymon Mutton, PA-C  atorvastatin (LIPITOR) 40 MG tablet Take 40 mg by mouth daily.    Yes [provider]  budesonide-formoterol (SYMBICORT) 160-4.5 MCG/ACT inhaler Inhale 2 puffs into the lungs 2 (two) times daily.   Yes [provider]  carvedilol (COREG) 6.25 MG tablet Take 1 tablet (6.25 mg total) by mouth 2 (two) times daily with a meal. 07/31/18  Yes Altamese Dilling, MD  docusate sodium (COLACE) 100 MG capsule Take 1 capsule (100 mg total) by mouth 2 (two) times daily. 09/02/18  Yes Hackney, Tina A, FNP  isosorbide-hydrALAZINE (BIDIL) 20-37.5 MG tablet Take 1 tablet by mouth 3 (three) times daily. 08/10/18  Yes Dunn, Raymon Mutton, PA-C  linagliptin (TRADJENTA) 5 MG TABS tablet Take 1 tablet (5 mg total) by mouth daily. 08/21/18  Yes Enedina Finner, MD  nitroGLYCERIN (NITROSTAT) 0.4 MG SL tablet Place 0.4 mg under the tongue every 5 (five) minutes as needed for chest pain.   Yes [provider]  rivaroxaban (XARELTO) 15 MG TABS tablet Take 1 tablet (15 mg total) by mouth daily with supper. 08/20/18  Yes Enedina Finner, MD  spironolactone (ALDACTONE) 25 MG tablet Take 1 tablet (25 mg total) by mouth daily. 09/02/18  Yes Clarisa Kindred A, FNP  torsemide (DEMADEX) 20 MG tablet Take 1 tablet (20 mg total) by mouth 2 (two)  times daily. 08/20/18  Yes Enedina Finner, MD  traZODone (DESYREL) 50 MG tablet Take 50 mg by mouth at bedtime.   Yes [provider]    Review of Systems  Constitutional: Positive for fatigue. Negative for appetite change.  HENT: Positive for rhinorrhea. Negative for congestion and sore throat.   Eyes: Negative.   Respiratory: Positive for cough and shortness of breath (with minimal exertion). Negative for chest tightness.   Cardiovascular: Negative for chest pain, palpitations and leg swelling.  Gastrointestinal: Negative for abdominal distention and abdominal pain.  Endocrine: Negative.   Genitourinary: Negative.   Musculoskeletal: Positive for back pain. Negative for neck pain.  Skin: Negative.   Allergic/Immunologic: Negative.   Neurological: Negative for dizziness and light-headedness.  Hematological: Negative for adenopathy. Does not bruise/bleed easily.  Psychiatric/Behavioral: Negative for dysphoric mood and sleep disturbance (sleeping on 2 pillows; wearing CPAP). The patient is not nervous/anxious.     Vitals:    09/02/18 0850  BP: 118/76  Pulse: 68  Resp: 18  SpO2: 100%  Weight: 210 lb 4 oz (95.4 kg)  Height:  (1.803 m)   Wt Readings from Last 3 Encounters:  09/02/18 210 lb 4 oz (95.4 kg)  08/18/18 202 lb (91.6 kg)  08/10/18 217 lb 4 oz (98.5 kg)   Lab Results  Component Value Date   CREATININE 2.05 (H) 08/19/2018   CREATININE 1.85 (H) 08/18/2018   CREATININE 1.74 (H) 08/10/2018    Physical Exam Vitals signs and nursing note reviewed.  Constitutional:      Appearance: He is well-developed.  HENT:     Head: Normocephalic and atraumatic.  Neck:     Musculoskeletal: Normal range of motion and neck supple.     Vascular: No JVD.  Cardiovascular:     Rate and Rhythm: Normal rate and regular rhythm.  Pulmonary:     Effort: Pulmonary effort is normal. No respiratory distress.     Breath sounds: No wheezing or rales.  Abdominal:     Palpations: Abdomen is soft.     Tenderness: There is no abdominal tenderness.  Musculoskeletal:     Right lower leg: He exhibits no tenderness. No edema.     Left lower leg: He exhibits no tenderness. No edema.  Skin:    General: Skin is warm and dry.  Neurological:     General: No focal deficit present.     Mental Status: He is alert and oriented to person, place, and time.  Psychiatric:        Mood and Affect: Mood normal.        Behavior: Behavior normal.    Assessment & Plan:   1: Chronic heart failure with reduced ejection fraction- - NYHA class III - euvolemic today - weighing daily and he was reminded to call for an overnight weight gain of >2 pounds or a weekly weight gain of >5 pounds - not adding salt and his wife has been reading food labels. Reviewed the importance of closely following a  sodium diet and written dietary information was given to him about this - consider entresto if able with renal function - could consider titrating up carvedilol although he has been bradycardic in the past - saw cardiology (Dunn)  08/10/2018 - BNP 03/04/18 was 524.0 - PharmD reconciled medications with the patient - received his pneumonia vaccine and had the flu  2: HTN- - BP looks good today although on the low side - goes to [facility] -  BMP from 08/19/2018 reviewed and showed sodium 136, potassium 4.0, creatinine 2.05 and GFR 39  3: DM- - A1c 08/18/2018 was 8.8%  4: Atrial fibrillation- - cardioverted 07/30/2018 - on amiodarone as well as xarelto  Medication list was reviewed.  Return in 6 weeks or sooner for any questions/problems before then.

## 2018-09-02 ENCOUNTER — Ambulatory Visit: Payer: Self-pay | Attending: Family | Admitting: Family

## 2018-09-02 ENCOUNTER — Encounter: Payer: Self-pay | Admitting: Pharmacist

## 2018-09-02 ENCOUNTER — Encounter: Payer: Self-pay | Admitting: Family

## 2018-09-02 VITALS — BP 118/76 | HR 68 | Resp 18 | Ht 71.0 in | Wt 210.2 lb

## 2018-09-02 DIAGNOSIS — E119 Type 2 diabetes mellitus without complications: Secondary | ICD-10-CM | POA: Insufficient documentation

## 2018-09-02 DIAGNOSIS — Z7901 Long term (current) use of anticoagulants: Secondary | ICD-10-CM | POA: Insufficient documentation

## 2018-09-02 DIAGNOSIS — I5022 Chronic systolic (congestive) heart failure: Secondary | ICD-10-CM

## 2018-09-02 DIAGNOSIS — J3489 Other specified disorders of nose and nasal sinuses: Secondary | ICD-10-CM | POA: Insufficient documentation

## 2018-09-02 DIAGNOSIS — I10 Essential (primary) hypertension: Secondary | ICD-10-CM | POA: Insufficient documentation

## 2018-09-02 DIAGNOSIS — Z8249 Family history of ischemic heart disease and other diseases of the circulatory system: Secondary | ICD-10-CM | POA: Insufficient documentation

## 2018-09-02 DIAGNOSIS — J449 Chronic obstructive pulmonary disease, unspecified: Secondary | ICD-10-CM | POA: Insufficient documentation

## 2018-09-02 DIAGNOSIS — Z955 Presence of coronary angioplasty implant and graft: Secondary | ICD-10-CM | POA: Insufficient documentation

## 2018-09-02 DIAGNOSIS — I2510 Atherosclerotic heart disease of native coronary artery without angina pectoris: Secondary | ICD-10-CM | POA: Insufficient documentation

## 2018-09-02 DIAGNOSIS — I4891 Unspecified atrial fibrillation: Secondary | ICD-10-CM | POA: Insufficient documentation

## 2018-09-02 DIAGNOSIS — I255 Ischemic cardiomyopathy: Secondary | ICD-10-CM | POA: Insufficient documentation

## 2018-09-02 DIAGNOSIS — M549 Dorsalgia, unspecified: Secondary | ICD-10-CM | POA: Insufficient documentation

## 2018-09-02 DIAGNOSIS — I130 Hypertensive heart and chronic kidney disease with heart failure and stage 1 through stage 4 chronic kidney disease, or unspecified chronic kidney disease: Secondary | ICD-10-CM | POA: Insufficient documentation

## 2018-09-02 DIAGNOSIS — Z87891 Personal history of nicotine dependence: Secondary | ICD-10-CM | POA: Insufficient documentation

## 2018-09-02 DIAGNOSIS — E785 Hyperlipidemia, unspecified: Secondary | ICD-10-CM | POA: Insufficient documentation

## 2018-09-02 DIAGNOSIS — I5042 Chronic combined systolic (congestive) and diastolic (congestive) heart failure: Secondary | ICD-10-CM | POA: Insufficient documentation

## 2018-09-02 DIAGNOSIS — N183 Chronic kidney disease, stage 3 (moderate): Secondary | ICD-10-CM

## 2018-09-02 DIAGNOSIS — Z8673 Personal history of transient ischemic attack (TIA), and cerebral infarction without residual deficits: Secondary | ICD-10-CM | POA: Insufficient documentation

## 2018-09-02 DIAGNOSIS — I4892 Unspecified atrial flutter: Secondary | ICD-10-CM

## 2018-09-02 DIAGNOSIS — I272 Pulmonary hypertension, unspecified: Secondary | ICD-10-CM | POA: Insufficient documentation

## 2018-09-02 DIAGNOSIS — Z79899 Other long term (current) drug therapy: Secondary | ICD-10-CM | POA: Insufficient documentation

## 2018-09-02 DIAGNOSIS — N189 Chronic kidney disease, unspecified: Secondary | ICD-10-CM | POA: Insufficient documentation

## 2018-09-02 DIAGNOSIS — E1122 Type 2 diabetes mellitus with diabetic chronic kidney disease: Secondary | ICD-10-CM | POA: Insufficient documentation

## 2018-09-02 DIAGNOSIS — M109 Gout, unspecified: Secondary | ICD-10-CM | POA: Insufficient documentation

## 2018-09-02 MED ORDER — SPIRONOLACTONE 25 MG PO TABS: 25.0000 mg | ORAL_TABLET | Freq: Every day | ORAL | 6 refills | Status: DC

## 2018-09-02 MED ORDER — DOCUSATE SODIUM 100 MG PO CAPS: 100.0000 mg | ORAL_CAPSULE | Freq: Two times a day (BID) | ORAL | 6 refills | Status: DC

## 2018-09-02 NOTE — Progress Notes (Signed)
[facility] - HEART FAILURE CLINIC - PHARMACIST COUNSELING NOTE  ADHERENCE ASSESSMENT  Adherence strategy: pill box   Do you ever forget to take your medication? [x] Yes (1) [] No (0)  Do you ever skip doses due to side effects? [] Yes (1) [x] No (0)  Do you have trouble affording your medicines? [] Yes (1) [x] No (0)  Are you ever unable to pick up your medication due to transportation difficulties? [] Yes (1) [x] No (0)  Do you ever stop taking your medications because you don't believe they are helping? [] Yes (1) [x] No (0)  Total score _0______    Recommendations given to patient about increasing adherence: None. Patient's wife puts all medications in his pill box and helps him take them. She reports that she rarely forgets to give him his medications and always makes sure he has his Xarelto with dinner.  Guideline-Directed Medical Therapy/Evidence Based Medicine    ACE/ARB/ARNI: none (decreased renal function)   Beta Blocker: carvedilol 6.25 mg twice daily   Aldosterone Antagonist: spironolactone 25 mg daily Diuretic: torsemide 20 mg twice daily    SUBJECTIVE   HPI: Here for initial visit to HF clinic. Needs refills on spironolactone and docusate. Denies swelling and endorses shortness of breath when walking.  Past Medical History:  Diagnosis Date  . CAD in native artery    a. stress echo 12/2007 abnl, EF > 55%, b. LHC 01/28/08: mLAD 30, D1 40, dLCx 70, pRCA 30, mRCA 70, mRCA lesion 2 80, PDA 90, s/p PCI/BMS to prox and distal RCA, s/p PCI/BMS to PDA; c. patient reports PCI/stenting x 2 in early 2017 at the [place] (no records on file) d. 06/2016: cath showing patent stents along RCA and LCx with moderate 40% stenosis along the LAD.   [name] Carotid arterial disease (HCC)    a. 05/2018 CTA Head/neck: 80-90% stenosis @ L carotid bifurcation, extending into the prox LICA. 60% stenosis @ R carotid bifurcation. 50-75% bilateral distal cavernous carotid dzs.  . Chronic combined  systolic (congestive) and diastolic (congestive) heart failure (HCC)    a. 2009 Echo: > 55%; b. 06/2016: EF 25-30%; c. 12/2017 Echo: EF 30-35%, diff HK; d. 05/2018 Echo: 30-35%, mild conc LVH, diff HK. Mild MR. Mildly dil LA/RA.  [name] Chronic kidney disease   . COPD (chronic obstructive pulmonary disease) (HCC)   . Diabetes mellitus without complication (HCC)   . Gout   . Hyperlipidemia   . Hypertension   . Hypertensive heart disease   . Ischemic cardiomyopathy    a. 05/2018: 30-35%.  . Obesity   . Persistent atrial fibrillation 01/2017   a. cardioverted 9/18 to NSR; b. 12/2017 noted to be back in Afib-outpt dccv rec; c. CHA2DS2VASc = 6-->Xarelto.  . Stroke (cerebrum) (HCC)    a. 05/2018 MRI: small patchy acute to subacute cortial and white matter infarct in the bilat cerebrum. Mild chronic small vessel ischmia; b. 05/2018 Carotid U/S: L-carotid bifurcation 80-90% stenosed, R- carotid bifurcation 60% stenosed.  . Tobacco abuse         OBJECTIVE    Vital signs: HR 68, BP 118/76, weight (pounds) 210.8  ECHO: Date 06/14/18, EF 30-35%    BMP Latest Ref Rng & Units 08/19/2018 08/18/2018 08/10/2018  Glucose 70 - 99 mg/dL 005(R) 102(T) 117(B)  BUN 8 - 23 mg/dL 56(P) 01(I) 10(V)  Creatinine 0.61 - 1.24 mg/dL 0.13(H) 4.38(O) 8.75(Z)  BUN/Creat Ratio 9 - 20 - - -  Sodium 135 - 145 mmol/L 136 135 138  Potassium 3.5 -  5.1 mmol/L 4.0 3.9 4.5  Chloride 98 - 111 mmol/L 103 98 100  CO2 22 - 32 mmol/L 22 26 31   Calcium 8.9 - 10.3 mg/dL 1.6(X8.8(L) 9.1 9.6    ASSESSMENT 63 year old male with HFrEF. He denies swelling, dizziness or light-headedness. Endorses shortness of breath when walking. He takes his time when changing positions (e.g. getting out of bed in the morning). Patient on Xarelto; denies any falls. With increased SCr, unable to add ACE inhibitor/ARB/Entresto. Patient on Bidil. BP and HR today WNL. Carvedilol titrated down in past for bradycardia.   PLAN Defer medication titration today and  follow up at next appointment. Consider addition of ACE/ARB/Entresto in the future as renal function and BP allow.    Time spent: 10 minutes  [name] K Donye Dauenhauer, Pharm.D. 09/02/2018 9:34 AM    Current Outpatient Medications:  .  albuterol-ipratropium (COMBIVENT) 18-103 MCG/ACT inhaler, Inhale 2 puffs into the lungs every 4 (four) hours as needed for wheezing., Disp: , Rfl:  .  amiodarone (PACERONE) 200 MG tablet, Take 1 tablet (200 mg total) twice daily for 1 week, then 1 tablet (200 mg total) once daily thereafter., Disp: 90 tablet, Rfl: 3 .  atorvastatin (LIPITOR) 40 MG tablet, Take 40 mg by mouth daily. , Disp: , Rfl:  .  budesonide-formoterol (SYMBICORT) 160-4.5 MCG/ACT inhaler, Inhale 2 puffs into the lungs 2 (two) times daily., Disp: , Rfl:  .  carvedilol (COREG) 6.25 MG tablet, Take 1 tablet (6.25 mg total) by mouth 2 (two) times daily with a meal., Disp: 60 tablet, Rfl: 1 .  docusate sodium (COLACE) 100 MG capsule, Take 1 capsule (100 mg total) by mouth 2 (two) times daily., Disp: 60 capsule, Rfl: 6 .  isosorbide-hydrALAZINE (BIDIL) 20-37.5 MG tablet, Take 1 tablet by mouth 3 (three) times daily., Disp: 90 tablet, Rfl: 6 .  linagliptin (TRADJENTA) 5 MG TABS tablet, Take 1 tablet (5 mg total) by mouth daily., Disp: 30 tablet, Rfl: 2 .  nitroGLYCERIN (NITROSTAT) 0.4 MG SL tablet, Place 0.4 mg under the tongue every 5 (five) minutes as needed for chest pain., Disp: , Rfl:  .  rivaroxaban (XARELTO) 15 MG TABS tablet, Take 1 tablet (15 mg total) by mouth daily with supper., Disp: 30 tablet, Rfl: 1 .  spironolactone (ALDACTONE) 25 MG tablet, Take 1 tablet (25 mg total) by mouth daily., Disp: 30 tablet, Rfl: 6 .  torsemide (DEMADEX) 20 MG tablet, Take 1 tablet (20 mg total) by mouth 2 (two) times daily., Disp: 60 tablet, Rfl: 0 .  traZODone (DESYREL) 50 MG tablet, Take 50 mg by mouth at bedtime., Disp: , Rfl:    COUNSELING POINTS/CLINICAL PEARLS  Carvedilol (Goal: weight less than 85 kg is  25 mg BID, weight greater than 85 kg is 50 mg BID)  Patient should avoid activities requiring coordination until drug effects are realized, as drug may cause dizziness.  This drug may cause diarrhea, nausea, vomiting, arthralgia, back pain, myalgia, headache, vision disorder, erectile dysfunction, reduced libido, or fatigue.  Instruct patient to report signs/symptoms of adverse cardiovascular effects such as hypotension (especially in elderly patients), arrhythmias, syncope, palpitations, angina, or edema.  Drug may mask symptoms of hypoglycemia. Advise diabetic patients to carefully monitor blood sugar levels.  Patient should take drug with food.  Advise patient against sudden discontinuation of drug. Torsemide  Side effects may include excessive urination.  Tell patient to report symptoms of ototoxicity.  Instruct patient to report lightheadedness or syncope.  Warn patient to avoid  use of nonprescription NSAID products without first discussing it with their healthcare provider. Spironolactone  Warn patient to report dehydration, hypotension, or symptoms of worsening renal function.  Counsel male patient to report gynecomastia.  Side effects may include diarrhea, nausea, vomiting, abdominal cramping, fever, leg cramps, lethargy, mental confusion, decreased libido, irregular menses, and rash. Suspension: Tell patient to take drug consistently with respect to food, either before or after a meal.  Advise patient to avoid potassium supplements and foods containing high levels of potassium, including salt substitutes.   DRUGS TO AVOID IN HEART FAILURE  Drug or Class Mechanism  Analgesics . NSAIDs . COX-2 inhibitors . Glucocorticoids  Sodium and water retention, increased systemic vascular resistance, decreased response to diuretics   Diabetes Medications . Metformin . Thiazolidinediones o Rosiglitazone (Avandia) o Pioglitazone (Actos) . DPP4 Inhibitors o Saxagliptin  (Onglyza) o Sitagliptin (Januvia)   Lactic acidosis Possible calcium channel blockade   Unknown  Antiarrhythmics . Class I  o Flecainide o Disopyramide . Class III o Sotalol . Other o Dronedarone  Negative inotrope, proarrhythmic   Proarrhythmic, beta blockade  Negative inotrope  Antihypertensives . Alpha Blockers o Doxazosin . Calcium Channel Blockers o Diltiazem o Verapamil o Nifedipine . Central Alpha Adrenergics o Moxonidine . Peripheral Vasodilators o Minoxidil  Increases renin and aldosterone  Negative inotrope    Possible sympathetic withdrawal  Unknown  Anti-infective . Itraconazole . Amphotericin B  Negative inotrope Unknown  Hematologic . Anagrelide . Cilostazol   Possible inhibition of PD IV Inhibition of PD III causing arrhythmias  Neurologic/Psychiatric . Stimulants . Anti-Seizure Drugs o Carbamazepine o Pregabalin . Antidepressants o Tricyclics o Citalopram . Parkinsons o Bromocriptine o Pergolide o Pramipexole . Antipsychotics o Clozapine . Antimigraine o Ergotamine o Methysergide . Appetite suppressants . Bipolar o Lithium  Peripheral alpha and beta agonist activity  Negative inotrope and chronotrope Calcium channel blockade  Negative inotrope, proarrhythmic Dose-dependent QT prolongation  Excessive serotonin activity/valvular damage Excessive serotonin activity/valvular damage Unknown  IgE mediated hypersensitivy, calcium channel blockade  Excessive serotonin activity/valvular damage Excessive serotonin activity/valvular damage Valvular damage  Direct myofibrillar degeneration, adrenergic stimulation  Antimalarials . Chloroquine . Hydroxychloroquine Intracellular inhibition of lysosomal enzymes  Urologic Agents . Alpha Blockers o Doxazosin o Prazosin o Tamsulosin o Terazosin  Increased renin and aldosterone  Adapted from Page RL, et al. "Drugs That May Cause or Exacerbate Heart Failure: A Scientific  Statement from the American Heart  Association." Circulation 2016; 134:e32-e69. DOI: 10.1161/CIR.0000000000000426   MEDICATION ADHERENCES TIPS AND STRATEGIES 1. Taking medication as prescribed improves patient outcomes in heart failure (reduces hospitalizations, improves symptoms, increases survival) 2. Side effects of medications can be managed by decreasing doses, switching agents, stopping drugs, or adding additional therapy. Please let someone in the Heart Failure Clinic know if you have having bothersome side effects so we can modify your regimen. Do not alter your medication regimen without talking to [place].  3. Medication reminders can help patients remember to take drugs on time. If you are missing or forgetting doses you can try linking behaviors, using pill boxes, or an electronic reminder like an alarm on your phone or an app. Some people can also get automated phone calls as medication reminders.

## 2018-09-02 NOTE — Patient Instructions (Signed)
Continue weighing daily and call for an overnight weight gain of > 2 pounds or a weekly weight gain of >5 pounds. 

## 2018-09-08 NOTE — Progress Notes (Signed)
Cardiology Office Note Date:  09/09/2018  Patient ID:  James, Harrington February 13, 1956, MRN [id number] PCP:  [facility]  Cardiologist:  [facility]    Chief Complaint: Follow-up  History of Present Illness: [name] is a 64 y.o. male with history of CAD status post prior stenting to the RCA and rPDA, HFrEF with an EF of 30 to 35% by echo in 05/2018 due to ICM, persistent A. fib/flutter on Xarelto, carotid artery disease status post recent stroke in 05/2018 pending right sided CEA, CKD stage II, COPD secondary to tobacco abuse, diabetes, hypertension, hyperlipidemia, and obesity who presents for follow-up of his CAD, ischemic cardiomyopathy, and A. fib/flutter.  He has previously been followed by cardiology at the [facility].  His cardiac history dates back to at least 2008 with an abnormal stress test at that time.  This was followed by cardiac cath with PCI to the distal RCA and rPDA in 2009.  He reportedly had repeat stenting and 2017 and was hospitalized in 06/2016 with CHF and a new finding of LV dysfunction with an EF of 25 to 30%.  Cath at that time showed a patent RCA/rPDA and LCx stents.  He was medically managed.  In 02/2017, he was admitted with A. fib/flutter with RVR and underwent successful cardioversion.  He was again admitted in 12/2017 with recurrent A. fib/flutter and CHF exacerbation.  He was rate controlled and maintained on Xarelto with a plan for outpatient cardioversion.  Patient followed up with the [facility] and it was unclear if he ever underwent cardioversion.  He was admitted in 05/2018 with slurred speech and facial droop.  MRI showed bilateral cerebral stroke and CTA of the head/neck showed severe right carotid artery disease.  He was seen by vascular surgery with plans for outpatient right-sided CEA, however this was delayed in the setting of seeking cardiology clearance from his outpatient cardiologist.  He was  admitted to the hospital in 06/2018 with sepsis secondary to pyelonephritis, CHF exacerbation, A. fib with RVR, and AKI.  He was loaded with amiodarone and underwent successful cardioversion on 07/30/2018 with subsequent notation of sinus bradycardia leading to tapering of Coreg.  His Xarelto was renally dosed given his AKI.  Troponin peaked at 0.43 and was felt to be demand ischemia in the setting of sepsis and A. fib with RVR with AKI.  In hospital follow-up on 08/10/2018, he was doing well from a cardiac perspective.  His weight had continued to decrease following his discharge.  Chest x-ray was performed at the patient's request given this was requested by his PCP for follow-up which showed cardiomegaly with mild vascular congestion and no frank CHF or pneumonia.  Nuclear stress test was recommended though I do not see where this was scheduled.  He has subsequently been admitted to the hospital in late 07/2018 for influenza B.  He was most recently seen by the [facility] CHF clinic on 09/02/2018 with a documented weight of 210 pounds.  No changes were made.  Labs: 07/2018 - potassium 3.9, serum creatinine 1.85, AST/ALT normal, WBC 3.2, Hgb 16, A1c 8.8  He comes in accompanied by his wife today and is doing quite well.  He denies any chest pain, shortness of breath, dizziness, presyncope, or syncope.  Lower extremity swelling has resolved.  No abdominal distention, orthopnea, PND, early satiety.  Weight is down 7 pounds from his last office visit.  Compliant with all medications including renally dosed Xarelto.  No falls, BRBPR, or melena.  Continues to follow a low-sodium diet.  Has follow-up with his PCP through the [place] [facility] later this afternoon.  Renal function has been closely monitored by nephrology with most recent serum creatinine 1.7.  Past Medical History:  Diagnosis Date  . CAD in native artery    a. stress echo 12/2007 abnl, EF > 55%, b. LHC 01/28/08: mLAD 30, D1 40, dLCx 70, pRCA 30, mRCA 70,  mRCA lesion 2 80, PDA 90, s/p PCI/BMS to prox and distal RCA, s/p PCI/BMS to PDA; c. patient reports PCI/stenting x 2 in early 2017 at the [place] (no records on file) d. 06/2016: cath showing patent stents along RCA and LCx with moderate 40% stenosis along the LAD.   [name] Carotid arterial disease (HCC)    a. 05/2018 CTA Head/neck: 80-90% stenosis @ L carotid bifurcation, extending into the prox LICA. 60% stenosis @ R carotid bifurcation. 50-75% bilateral distal cavernous carotid dzs.  . Chronic combined systolic (congestive) and diastolic (congestive) heart failure (HCC)    a. 2009 Echo: > 55%; b. 06/2016: EF 25-30%; c. 12/2017 Echo: EF 30-35%, diff HK; d. 05/2018 Echo: 30-35%, mild conc LVH, diff HK. Mild MR. Mildly dil LA/RA.  [name] Chronic kidney disease   . COPD (chronic obstructive pulmonary disease) (HCC)   . Diabetes mellitus without complication (HCC)   . Gout   . Hyperlipidemia   . Hypertension   . Hypertensive heart disease   . Ischemic cardiomyopathy    a. 05/2018: 30-35%.  . Obesity   . Persistent atrial fibrillation 01/2017   a. cardioverted 9/18 to NSR; b. 12/2017 noted to be back in Afib-outpt dccv rec; c. CHA2DS2VASc = 6-->Xarelto.  . Stroke (cerebrum) (HCC)    a. 05/2018 MRI: small patchy acute to subacute cortial and white matter infarct in the bilat cerebrum. Mild chronic small vessel ischmia; b. 05/2018 Carotid U/S: L-carotid bifurcation 80-90% stenosed, R- carotid bifurcation 60% stenosed.  . Tobacco abuse     Past Surgical History:  Procedure Laterality Date  . CARDIAC CATHETERIZATION  2009   [facility];   . CARDIAC CATHETERIZATION  2010  . CARDIAC CATHETERIZATION N/A 07/28/2016   Procedure: Right and Left Heart Cath and possible PCI;  Surgeon: Iran Ouch, MD;  Location: [facility] INVASIVE CV LAB;  Service: Cardiovascular;  Laterality: N/A;  . CARDIOVERSION N/A 03/27/2017   Procedure: CARDIOVERSION;  Surgeon: Iran Ouch, MD;  Location: [facility] ORS;  Service: Cardiovascular;   Laterality: N/A;  . CARDIOVERSION N/A 07/30/2018   Procedure: CARDIOVERSION;  Surgeon: Antonieta Iba, MD;  Location: [facility] ORS;  Service: Cardiovascular;  Laterality: N/A;  . CORONARY ANGIOPLASTY  2009   s/p stent placement at [facility].    Current Meds  Medication Sig  . albuterol-ipratropium (COMBIVENT) 18-103 MCG/ACT inhaler Inhale 2 puffs into the lungs every 4 (four) hours as needed for wheezing.  [name] amiodarone (PACERONE) 200 MG tablet Take 200 mg by mouth daily.  [name] atorvastatin (LIPITOR) 40 MG tablet Take 40 mg by mouth daily.   . budesonide-formoterol (SYMBICORT) 160-4.5 MCG/ACT inhaler Inhale 2 puffs into the lungs 2 (two) times daily.  . carvedilol (COREG) 6.25 MG tablet Take 1 tablet (6.25 mg total) by mouth 2 (two) times daily with a meal.  . docusate sodium (COLACE) 100 MG capsule Take 1 capsule (100 mg total) by mouth 2 (two) times daily.  . isosorbide-hydrALAZINE (BIDIL) 20-37.5 MG tablet Take 1 tablet by mouth 3 (three) times daily.  [name] linagliptin (  TRADJENTA) 5 MG TABS tablet Take 1 tablet (5 mg total) by mouth daily.  . nitroGLYCERIN (NITROSTAT) 0.4 MG SL tablet Place 0.4 mg under the tongue every 5 (five) minutes as needed for chest pain.  . rivaroxaban (XARELTO) 15 MG TABS tablet Take 1 tablet (15 mg total) by mouth daily with supper.  [name] spironolactone (ALDACTONE) 25 MG tablet Take 1 tablet (25 mg total) by mouth daily.  [name] torsemide (DEMADEX) 20 MG tablet Take 1 tablet (20 mg total) by mouth 2 (two) times daily.  . traZODone (DESYREL) 50 MG tablet Take 50 mg by mouth at bedtime.  . [DISCONTINUED] spironolactone (ALDACTONE) 25 MG tablet Take 1 tablet (25 mg total) by mouth daily.    Allergies:   Aspirin; Ibuprofen; and Tylenol [acetaminophen]   Social History:  The patient  reports that he has quit smoking. His smoking use included cigarettes. He has a 35.00 pack-year smoking history. He has never used smokeless tobacco. He reports previous alcohol use. He reports that he does  not use drugs.   Family History:  The patient's family history includes Cancer in his brother; Heart attack (age of onset: 37) in his mother; Hypertension in his mother.  ROS:   Review of Systems  Constitutional: Negative for chills, diaphoresis, fever, malaise/fatigue and weight loss.  HENT: Negative for congestion.   Eyes: Negative for discharge and redness.  Respiratory: Negative for cough, hemoptysis, sputum production, shortness of breath and wheezing.   Cardiovascular: Negative for chest pain, palpitations, orthopnea, claudication, leg swelling and PND.  Gastrointestinal: Negative for abdominal pain, blood in stool, heartburn, melena, nausea and vomiting.  Genitourinary: Negative for hematuria.  Musculoskeletal: Negative for falls and myalgias.  Skin: Negative for rash.  Neurological: Negative for dizziness, tingling, tremors, sensory change, speech change, focal weakness, loss of consciousness and weakness.  Endo/Heme/Allergies: Does not bruise/bleed easily.  Psychiatric/Behavioral: Negative for substance abuse. The patient is not nervous/anxious.   All other systems reviewed and are negative.    PHYSICAL EXAM:  VS:  BP 132/74 (BP Location: Left Arm, Patient Position: Sitting, Cuff Size: Normal)   Pulse 81   Ht 5\' 11"  (1.803 m)   Wt 207 lb 12 oz (94.2 kg)   BMI 28.98 kg/m  BMI: Body mass index is 28.98 kg/m.  Physical Exam  Constitutional: He is oriented to person, place, and time. He appears well-developed and well-nourished.  HENT:  Head: Normocephalic and atraumatic.  Eyes: Right eye exhibits no discharge. Left eye exhibits no discharge.  Neck: Normal range of motion. No JVD present.  Cardiovascular: Normal rate, regular rhythm, S1 normal, S2 normal and normal heart sounds. Exam reveals no distant heart sounds, no friction rub, no midsystolic click and no opening snap.  No murmur heard. Pulses:      Posterior tibial pulses are 2+ on the right side and 2+ on the left  side.  Pulmonary/Chest: Effort normal and breath sounds normal. No respiratory distress. He has no decreased breath sounds. He has no wheezes. He has no rales. He exhibits no tenderness.  Abdominal: Soft. He exhibits no distension. There is no abdominal tenderness.  Musculoskeletal:        General: No edema.  Neurological: He is alert and oriented to person, place, and time.  Skin: Skin is warm and dry. No cyanosis. Nails show no clubbing.  Psychiatric: He has a normal mood and affect. His speech is normal and behavior is normal. Judgment and thought content normal.     EKG:  Was ordered and interpreted by me today. Shows NSR, 81 bpm, first-degree AV block, left axis deviation, left anterior fascicular block, prior inferior infarct, nonspecific lateral ST-T changes which are not new when compared to prior studies dating back to at least 2013  Recent Labs: 03/04/2018: B Natriuretic Peptide 524.0 07/31/2018: Magnesium 2.2 08/10/2018: TSH 5.829 08/18/2018: Hemoglobin 16.0; Platelets 157 08/19/2018: ALT 30; BUN 39; Creatinine, Ser 2.05; Potassium 4.0; Sodium 136  06/14/2018: Cholesterol 139; HDL 33; LDL Cholesterol 79; Total CHOL/HDL Ratio 4.2; Triglycerides 135; VLDL 27   CrCl cannot be calculated (Patient's most recent lab result is older than the maximum 21 days allowed.).   Wt Readings from Last 3 Encounters:  09/09/18 207 lb 12 oz (94.2 kg)  09/02/18 210 lb 4 oz (95.4 kg)  08/18/18 202 lb (91.6 kg)     Other studies reviewed: Additional studies/records reviewed today include: summarized above  ASSESSMENT AND PLAN:  1. CAD involving the native coronary arteries without angina with history of recent elevated troponin.  No symptoms suggestive of angina.  During recent hospital admission, he was noted to have a mildly elevated troponin felt to be secondary to supply demand ischemia in the setting of sepsis with UTI/pyelonephritis, mild volume overload, and A. fib with RVR.  Schedule Lexiscan  Myoview to evaluate for high risk ischemia.  He is on Xarelto in place of aspirin.  Continue Coreg and Lipitor.  Aggressive risk factor modification.  2. HFrEF secondary to ICM: He appears euvolemic and well compensated.  Weight is down another 7 pounds compared to his last office visit.  Continue BiDil 20/37.5 3 times daily along with Coreg, spironolactone, and torsemide.  Recent BMP demonstrated stable potassium and renal function.  Not currently on ACE inhibitor/ARB/Entreto secondary to kidney disease.  Recommend repeat limited echo in follow-up to reevaluate his EF following optimization of heart failure medications.  If his EF remains less than 35% at that time would consider referral to EP for consideration of ICD.  3. Persistent A. fib: Maintaining sinus rhythm following recent cardioversion.  Continue Coreg for rate control as well as amiodarone for rhythm control.  Recent thyroid function and liver function unrevealing.  Continue Xarelto 20 mg daily given most recent creatinine clearance noted to be 51.6.  4. CKD stage II: most recent renal function is stable as above.  Followed by nephrology.  5. Carotid artery disease/recent CVA: Followed by vascular surgery.  They are planning for right sided CEA once he is cleared from a cardiac perspective.  Ischemic evaluation has been scheduled as above.  Continue optimal blood pressure and lipid control.  6. Hypertension: Blood pressure reasonably controlled today.  Continue current medications.  7. Hyperlipidemia: LDL of 79 from 05/2018.  Continue Lipitor.   Computer systems were down at the time of this patient encounter leading to limitation in plan from last visit.  Disposition: F/u with Dr. Kirke Corin or an APP in 3 months.  Current medicines are reviewed at length with the patient today.  The patient did not have any concerns regarding medicines.  Signed, Eula Listen, PA-C 09/09/2018 12:11 PM     [facility] [place] [address] [place], [place] [address] [phone]

## 2018-09-09 ENCOUNTER — Other Ambulatory Visit: Payer: Self-pay

## 2018-09-09 ENCOUNTER — Ambulatory Visit (INDEPENDENT_AMBULATORY_CARE_PROVIDER_SITE_OTHER): Payer: Self-pay | Admitting: Physician Assistant

## 2018-09-09 ENCOUNTER — Encounter: Payer: Self-pay | Admitting: Physician Assistant

## 2018-09-09 VITALS — BP 132/74 | HR 81 | Ht 71.0 in | Wt 207.8 lb

## 2018-09-09 DIAGNOSIS — I5022 Chronic systolic (congestive) heart failure: Secondary | ICD-10-CM

## 2018-09-09 DIAGNOSIS — R7989 Other specified abnormal findings of blood chemistry: Secondary | ICD-10-CM

## 2018-09-09 DIAGNOSIS — R778 Other specified abnormalities of plasma proteins: Secondary | ICD-10-CM

## 2018-09-09 DIAGNOSIS — I4819 Other persistent atrial fibrillation: Secondary | ICD-10-CM

## 2018-09-09 DIAGNOSIS — E785 Hyperlipidemia, unspecified: Secondary | ICD-10-CM

## 2018-09-09 DIAGNOSIS — Z8673 Personal history of transient ischemic attack (TIA), and cerebral infarction without residual deficits: Secondary | ICD-10-CM

## 2018-09-09 DIAGNOSIS — I6522 Occlusion and stenosis of left carotid artery: Secondary | ICD-10-CM

## 2018-09-09 DIAGNOSIS — I10 Essential (primary) hypertension: Secondary | ICD-10-CM

## 2018-09-09 DIAGNOSIS — I255 Ischemic cardiomyopathy: Secondary | ICD-10-CM

## 2018-09-09 DIAGNOSIS — I2510 Atherosclerotic heart disease of native coronary artery without angina pectoris: Secondary | ICD-10-CM

## 2018-09-09 DIAGNOSIS — N182 Chronic kidney disease, stage 2 (mild): Secondary | ICD-10-CM

## 2018-09-09 MED ORDER — SPIRONOLACTONE 25 MG PO TABS: 25.0000 mg | ORAL_TABLET | Freq: Every day | ORAL | 3 refills | Status: DC

## 2018-09-09 NOTE — Patient Instructions (Signed)
Medication Instructions:  Your physician recommends that you continue on your current medications as directed. Please refer to the Current Medication list given to you today.  If you need a refill on your cardiac medications before your next appointment, please call your pharmacy.   Lab work: Your physician recommends that you have lab work today(BMET)   If you have labs (blood work) drawn today and your tests are completely normal, you will receive your results only by: [name] MyChart Message (if you have MyChart) OR . A paper copy in the mail If you have any lab test that is abnormal or we need to change your treatment, we will call you to review the results.  Testing/Procedures: 1- Lexi  [facility] MYOVIEW  Your caregiver has ordered a Stress Test with nuclear imaging. The purpose of this test is to evaluate the blood supply to your heart muscle. This procedure is referred to as a "Non-Invasive Stress Test." This is because other than having an IV started in your vein, nothing is inserted or "invades" your body. Cardiac stress tests are done to find areas of poor blood flow to the heart by determining the extent of coronary artery disease (CAD). Some patients exercise on a treadmill, which naturally increases the blood flow to your heart, while others who are  unable to walk on a treadmill due to physical limitations have a pharmacologic/chemical stress agent called Lexiscan . This medicine will mimic walking on a treadmill by temporarily increasing your coronary blood flow.   Please note: these test may take anywhere between 2-4 hours to complete  PLEASE REPORT TO [facility] MEDICAL MALL ENTRANCE  THE VOLUNTEERS AT THE FIRST DESK WILL DIRECT YOU WHERE TO GO  Date of Procedure:_____________________________________  Arrival Time for Procedure:______________________________  Instructions regarding medication:   ___x_ : Hold diabetes medication morning of procedure (tradjenta)  __x__:  Hold  betablocker(s) night before procedure and morning of procedure (carvedilol) __x__:  Hold other medications as follows:_______Spironlactone and torsemide hold the morning of.___________________  PLEASE NOTIFY THE OFFICE AT LEAST 24 HOURS IN ADVANCE IF YOU ARE UNABLE TO KEEP YOUR APPOINTMENT.  [phone] AND  PLEASE NOTIFY NUCLEAR MEDICINE AT [facility] AT LEAST 24 HOURS IN ADVANCE IF YOU ARE UNABLE TO KEEP YOUR APPOINTMENT. [phone]  How to prepare for your Myoview test:  1. Do not eat or drink after midnight 2. No caffeine for 24 hours prior to test 3. No smoking 24 hours prior to test. 4. Your medication may be taken with water.  If your doctor stopped a medication because of this test, do not take that medication. 5. Ladies, please do not wear dresses.  Skirts or pants are appropriate. Please wear a short sleeve shirt. 6. No perfume, cologne or lotion. 7. Wear comfortable walking shoes. No heels!   Follow-Up: At [facility], you and your health needs are our priority.  As part of our continuing mission to provide you with exceptional heart care, we have created designated Provider Care Teams.  These Care Teams include your primary Cardiologist (physician) and Advanced Practice Providers (APPs -  Physician Assistants and Nurse Practitioners) who all work together to provide you with the care you need, when you need it. You will need a follow up appointment in 3 months.  Please establish primary cardiologist at your next visit.

## 2018-09-10 ENCOUNTER — Telehealth: Payer: Self-pay

## 2018-09-10 LAB — BASIC METABOLIC PANEL
BUN/Creatinine Ratio: 14 (ref 10–24)
BUN: 24 mg/dL (ref 8–27)
CO2: 25 mmol/L (ref 20–29)
Calcium: 9.4 mg/dL (ref 8.6–10.2)
Chloride: 98 mmol/L (ref 96–106)
Creatinine, Ser: 1.71 mg/dL — ABNORMAL HIGH (ref 0.76–1.27)
GFR calc Af Amer: 49 mL/min/{1.73_m2} — ABNORMAL LOW (ref >59)
GFR calc non Af Amer: 42 mL/min/{1.73_m2} — ABNORMAL LOW (ref >59)
Glucose: 175 mg/dL — ABNORMAL HIGH (ref 65–99)
Potassium: 3.8 mmol/L (ref 3.5–5.2)
Sodium: 137 mmol/L (ref 134–144)

## 2018-09-10 NOTE — Telephone Encounter (Signed)
DPR on file. Pt wife aware of lab results with verbalized understanding.

## 2018-09-10 NOTE — Telephone Encounter (Signed)
-----   Message from Sondra Barges, PA-C sent at 09/10/2018  2:31 PM EDT ----- Renal function has improved from last check at [facility] and is consistent with value checked with nephrology of 1.74 from 07/2018. Random glucose is elevated, known DM.  Potassium is ok.

## 2018-09-27 ENCOUNTER — Encounter: Payer: Self-pay | Admitting: Emergency Medicine

## 2018-09-27 ENCOUNTER — Emergency Department: Payer: Self-pay

## 2018-09-27 ENCOUNTER — Emergency Department
Admission: EM | Admit: 2018-09-27 | Discharge: 2018-09-27 | Disposition: A | Discharge status: Home / Self Care | Payer: Self-pay | Attending: Emergency Medicine | Admitting: Emergency Medicine

## 2018-09-27 ENCOUNTER — Other Ambulatory Visit: Payer: Self-pay

## 2018-09-27 DIAGNOSIS — M25462 Effusion, left knee: Secondary | ICD-10-CM | POA: Insufficient documentation

## 2018-09-27 DIAGNOSIS — N189 Chronic kidney disease, unspecified: Secondary | ICD-10-CM | POA: Insufficient documentation

## 2018-09-27 DIAGNOSIS — E119 Type 2 diabetes mellitus without complications: Secondary | ICD-10-CM | POA: Insufficient documentation

## 2018-09-27 DIAGNOSIS — Z7901 Long term (current) use of anticoagulants: Secondary | ICD-10-CM | POA: Insufficient documentation

## 2018-09-27 DIAGNOSIS — I129 Hypertensive chronic kidney disease with stage 1 through stage 4 chronic kidney disease, or unspecified chronic kidney disease: Secondary | ICD-10-CM | POA: Insufficient documentation

## 2018-09-27 DIAGNOSIS — Z87891 Personal history of nicotine dependence: Secondary | ICD-10-CM | POA: Insufficient documentation

## 2018-09-27 DIAGNOSIS — I2510 Atherosclerotic heart disease of native coronary artery without angina pectoris: Secondary | ICD-10-CM | POA: Insufficient documentation

## 2018-09-27 DIAGNOSIS — J449 Chronic obstructive pulmonary disease, unspecified: Secondary | ICD-10-CM | POA: Insufficient documentation

## 2018-09-27 DIAGNOSIS — Z79899 Other long term (current) drug therapy: Secondary | ICD-10-CM | POA: Insufficient documentation

## 2018-09-27 MED ORDER — TRAMADOL HCL 50 MG PO TABS: 50.0000 mg | ORAL_TABLET | Freq: Four times a day (QID) | ORAL | 0 refills | Status: DC | PRN

## 2018-09-27 NOTE — Discharge Instructions (Addendum)
Follow-up with your regular doctor Dr. Martha Clan in 1 week if not improving.  Keep the Ace wrap and knee immobilizer on this much as possible.  Apply ice to the knee.  Return to the emergency department if you are worsening.  Take medication as prescribed.

## 2018-09-27 NOTE — ED Provider Notes (Signed)
[facility] Emergency Department Provider Note  ____________________________________________   First MD Initiated Contact with Patient 09/27/18 1251     (approximate)  I have reviewed the triage vital signs and the nursing notes.   HISTORY  Chief Complaint Fall and Knee Pain    HPI [name] is a 63 y.o. male presents emergency department via EMS complaining of left knee pain.  States he fell on the left knee yesterday.  He states he has had a lot of swelling hurts to bend the knee.  Has a history of a previous fracture to this patella.  He denies any other injuries.  Not hit his head or lose consciousness.  Has had no headache.  Patient is on Coumadin.    Past Medical History:  Diagnosis Date  . CAD in native artery    a. stress echo 12/2007 abnl, EF > 55%, b. LHC 01/28/08: mLAD 30, D1 40, dLCx 70, pRCA 30, mRCA 70, mRCA lesion 2 80, PDA 90, s/p PCI/BMS to prox and distal RCA, s/p PCI/BMS to PDA; c. patient reports PCI/stenting x 2 in early 2017 at the [place] (no records on file) d. 06/2016: cath showing patent stents along RCA and LCx with moderate 40% stenosis along the LAD.   [name] Carotid arterial disease (HCC)    a. 05/2018 CTA Head/neck: 80-90% stenosis @ L carotid bifurcation, extending into the prox LICA. 60% stenosis @ R carotid bifurcation. 50-75% bilateral distal cavernous carotid dzs.  . Chronic combined systolic (congestive) and diastolic (congestive) heart failure (HCC)    a. 2009 Echo: > 55%; b. 06/2016: EF 25-30%; c. 12/2017 Echo: EF 30-35%, diff HK; d. 05/2018 Echo: 30-35%, mild conc LVH, diff HK. Mild MR. Mildly dil LA/RA.  [name] Chronic kidney disease   . COPD (chronic obstructive pulmonary disease) (HCC)   . Diabetes mellitus without complication (HCC)   . Gout   . Hyperlipidemia   . Hypertension   . Hypertensive heart disease   . Ischemic cardiomyopathy    a. 05/2018: 30-35%.  . Obesity   . Persistent atrial fibrillation 01/2017   a.  cardioverted 9/18 to NSR; b. 12/2017 noted to be back in Afib-outpt dccv rec; c. CHA2DS2VASc = 6-->Xarelto.  . Stroke (cerebrum) (HCC)    a. 05/2018 MRI: small patchy acute to subacute cortial and white matter infarct in the bilat cerebrum. Mild chronic small vessel ischmia; b. 05/2018 Carotid U/S: L-carotid bifurcation 80-90% stenosed, R- carotid bifurcation 60% stenosed.  . Tobacco abuse     Patient Active Problem List   Diagnosis Date Noted  . HTN (hypertension) 09/02/2018  . DM (diabetes mellitus) (HCC) 09/02/2018  . SOB (shortness of breath) 08/18/2018  . HFrEF (heart failure with reduced ejection fraction) (HCC)   . Lactic acidosis 07/25/2018  . Hyperbilirubinemia 07/25/2018  . Carotid stenosis 06/13/2018  . Elevated troponin 06/13/2018  . Atrial fibrillation, chronic 06/13/2018  . TIA (transient ischemic attack) 06/13/2018  . TIA involving carotid artery 06/13/2018  . Atrial flutter with rapid ventricular response (HCC) 02/18/2017  . CAD in native artery   . Chest pain 01/05/2017  . Ischemic cardiomyopathy 07/26/2016  . Non-sustained ventricular tachycardia (HCC) 07/26/2016  . COPD (chronic obstructive pulmonary disease) (HCC) 07/24/2016  . CAD (coronary artery disease) 07/24/2016  . HLD (hyperlipidemia) 07/24/2016  . Tobacco abuse 07/24/2016  . Hypertensive heart disease 07/24/2016    Past Surgical History:  Procedure Laterality Date  . CARDIAC CATHETERIZATION  2009   [facility];   . CARDIAC  CATHETERIZATION  2010  . CARDIAC CATHETERIZATION N/A 07/28/2016   Procedure: Right and Left Heart Cath and possible PCI;  Surgeon: Iran Ouch, MD;  Location: [facility] INVASIVE CV LAB;  Service: Cardiovascular;  Laterality: N/A;  . CARDIOVERSION N/A 03/27/2017   Procedure: CARDIOVERSION;  Surgeon: Iran Ouch, MD;  Location: [facility] ORS;  Service: Cardiovascular;  Laterality: N/A;  . CARDIOVERSION N/A 07/30/2018   Procedure: CARDIOVERSION;  Surgeon: Antonieta Iba, MD;  Location:  [facility] ORS;  Service: Cardiovascular;  Laterality: N/A;  . CORONARY ANGIOPLASTY  2009   s/p stent placement at [facility].    Prior to Admission medications   Medication Sig Start Date End Date Taking? Authorizing Provider  albuterol-ipratropium (COMBIVENT) 18-103 MCG/ACT inhaler Inhale 2 puffs into the lungs every 4 (four) hours as needed for wheezing.    [provider]  amiodarone (PACERONE) 200 MG tablet Take 200 mg by mouth daily.    [provider]  atorvastatin (LIPITOR) 40 MG tablet Take 40 mg by mouth daily.     [provider]  budesonide-formoterol (SYMBICORT) 160-4.5 MCG/ACT inhaler Inhale 2 puffs into the lungs 2 (two) times daily.    [provider]  carvedilol (COREG) 6.25 MG tablet Take 1 tablet (6.25 mg total) by mouth 2 (two) times daily with a meal. 07/31/18   Altamese Dilling, MD  docusate sodium (COLACE) 100 MG capsule Take 1 capsule (100 mg total) by mouth 2 (two) times daily. 09/02/18   Clarisa Kindred A, FNP  isosorbide-hydrALAZINE (BIDIL) 20-37.5 MG tablet Take 1 tablet by mouth 3 (three) times daily. 08/10/18   Dunn, Raymon Mutton, PA-C  linagliptin (TRADJENTA) 5 MG TABS tablet Take 1 tablet (5 mg total) by mouth daily. 08/21/18   Enedina Finner, MD  nitroGLYCERIN (NITROSTAT) 0.4 MG SL tablet Place 0.4 mg under the tongue every 5 (five) minutes as needed for chest pain.    [provider]  rivaroxaban (XARELTO) 15 MG TABS tablet Take 1 tablet (15 mg total) by mouth daily with supper. 08/20/18   Enedina Finner, MD  spironolactone (ALDACTONE) 25 MG tablet Take 1 tablet (25 mg total) by mouth daily. 09/09/18   Dunn, Raymon Mutton, PA-C  torsemide (DEMADEX) 20 MG tablet Take 1 tablet (20 mg total) by mouth 2 (two) times daily. 08/20/18   Enedina Finner, MD  traMADol (ULTRAM) 50 MG tablet Take 1 tablet (50 mg total) by mouth every 6 (six) hours as needed. 09/27/18   Fisher, Roselyn Bering, PA-C  traZODone (DESYREL) 50 MG tablet Take 50 mg by mouth at bedtime.    [provider]    Allergies Aspirin; Ibuprofen; and Tylenol [acetaminophen]  Family History  Problem Relation Age of Onset  . Heart attack Mother 28  . Hypertension Mother   . Cancer Brother     Social History Social History   Tobacco Use  . Smoking status: Former Smoker    Packs/day: 1.00    Years: 35.00    Pack years: 35.00    Types: Cigarettes  . Smokeless tobacco: Never Used  Substance Use Topics  . Alcohol use: Not Currently    Comment: Quit 8 yrs.  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 Drug use: No    Review of Systems  Constitutional: No fever/chills Eyes: No visual changes. ENT: No sore throat. Respiratory: Denies cough Genitourinary: Negative for dysuria. Musculoskeletal: Negative for back pain.  Positive for left knee pain Skin: Negative for rash.    ____________________________________________   PHYSICAL EXAM:  VITAL SIGNS: ED Triage Vitals  Enc Vitals Group     BP 09/27/18 1258 (!) 168/94     Pulse Rate 09/27/18 1258 78     Resp --      Temp 09/27/18 1258 98 F (36.7 C)     Temp Source 09/27/18 1258 Oral     SpO2 09/27/18 1258 98 %     Weight 09/27/18 1253 207 lb 10.8 oz (94.2 kg)     Height 09/27/18 1253 5\' 11"  (1.803 m)     Head Circumference --      Peak Flow --      Pain Score 09/27/18 1253 6     Pain Loc --      Pain Edu? --      Excl. in GC? --     Constitutional: Alert and oriented. Well appearing and in no acute distress. Eyes: Conjunctivae are normal.  Head: Atraumatic. Nose: No congestion/rhinnorhea. Mouth/Throat: Mucous membranes are moist.   Neck:  supple no lymphadenopathy noted Cardiovascular: Normal rate, regular rhythm. Heart sounds are normal Respiratory: Normal respiratory effort.  No retractions, lungs c t a  GU: deferred Musculoskeletal: Decreased range of motion of the left knee.  Swelling and tenderness noted over the patella.  Left hip and left lower leg are not tender.  Lumbar spine is not tender.  Neurologic:  Normal  speech and language.  Cranial nerves II through XII grossly intact Skin:  Skin is warm, dry and intact. No rash noted. Psychiatric: Mood and affect are normal. Speech and behavior are normal.  ____________________________________________   LABS (all labs ordered are listed, but only abnormal results are displayed)  Labs Reviewed - No data to display ____________________________________________   ____________________________________________  RADIOLOGY  X-ray of the left knee shows a knee effusion but no fracture  ____________________________________________   PROCEDURES  Procedure(s) performed: Ace wrap and knee immobilizer applied by nursing staff   Procedures    ____________________________________________   INITIAL IMPRESSION / ASSESSMENT AND PLAN / ED COURSE  Pertinent labs & imaging results that were available during my care of the patient were reviewed by me and considered in my medical decision making (see chart for details).   Patient is 63 year old male presents emergency department after a fall and landing on his left knee.  Physical exam shows left knee to be tender and swollen.  X-ray left knee shows a effusion but no fracture.  Explained findings to the patient.  He was placed in a Ace wrap knee immobilizer.  Was given a prescription for tramadol.  He is to apply ice to the left knee.  Follow-up orthopedics if he is not better in 1 week.  Or follow-up with his regular doctor.  States he understands will comply.  He was discharged stable condition.     As part of my medical decision making, I reviewed the following data within the electronic MEDICAL RECORD NUMBER Nursing notes reviewed and incorporated, Old chart reviewed, Radiograph reviewed x-ray of the left knee shows a knee effusion, Notes from prior ED visits and [place] Controlled Substance Database  ____________________________________________   FINAL CLINICAL IMPRESSION(S) / ED DIAGNOSES  Final  diagnoses:  Knee effusion, left      NEW MEDICATIONS STARTED DURING THIS VISIT:  New Prescriptions   TRAMADOL (ULTRAM) 50 MG TABLET    Take 1 tablet (50 mg total) by mouth every 6 (six) hours as needed.     Note:  This document was prepared using Conservation officer, historic buildings  and may include unintentional dictation errors.    Faythe Ghee, PA-C 09/27/18 1338    Emily Filbert, MD 09/27/18 [phone]

## 2018-10-11 ENCOUNTER — Telehealth: Payer: Self-pay | Admitting: Internal Medicine

## 2018-10-11 NOTE — Telephone Encounter (Signed)
Pt c/o swelling: STAT is pt has developed SOB within 24 hours  1) How much weight have you gained and in what time span? 6 lbs since 4/9   2) If swelling, where is the swelling located? Abdominal    3) Are you currently taking a fluid pill? Yes   4) Are you currently SOB? No   5) Do you have a log of your daily weights (if so, list)?  Yes hasnt weighed the last due to knee pain   6) Have you gained 3 pounds in a day or 5 pounds in a week?  Yes   7) Have you traveled recently? No - but prolonged sitting

## 2018-10-11 NOTE — Telephone Encounter (Signed)
I spoke with Annette-HH RN. She states that the patient's weight was up today: 4/9- 209 lbs Today- 215.6 lbs  Drinda Butts states that the patient's wife confims he does not adhere to a low sodium diet. She has done dietary education with them this morning.  Drinda Butts advises the patient does not have lower extremity edema or shortness of breath, but his wife confirms abdominal fullness.   Drinda Butts states that the patient's wife advised he is taking his lasix, but HH-RN is unsure of how accurate this is.  I advised Drinda Butts I would call the patient and his wife to discuss further- chart states torsemide. Drinda Butts states she is still in the driveway so they are both in the home and awake.  I did call the patient's # listed in his chart and this went to voice mail. I asked that the patient/ his wife call back to discuss further.

## 2018-10-12 ENCOUNTER — Telehealth: Payer: Self-pay

## 2018-10-12 ENCOUNTER — Encounter: Payer: Self-pay | Admitting: Physician Assistant

## 2018-10-12 ENCOUNTER — Telehealth: Payer: Self-pay | Admitting: Cardiovascular Disease

## 2018-10-12 ENCOUNTER — Other Ambulatory Visit: Payer: Self-pay

## 2018-10-12 ENCOUNTER — Telehealth: Payer: Self-pay | Admitting: Physician Assistant

## 2018-10-12 ENCOUNTER — Telehealth (INDEPENDENT_AMBULATORY_CARE_PROVIDER_SITE_OTHER): Payer: Self-pay | Admitting: Physician Assistant

## 2018-10-12 VITALS — BP 174/91 | HR 40 | Ht 71.0 in | Wt 214.1 lb

## 2018-10-12 DIAGNOSIS — Z9111 Patient's noncompliance with dietary regimen: Secondary | ICD-10-CM

## 2018-10-12 DIAGNOSIS — Z8673 Personal history of transient ischemic attack (TIA), and cerebral infarction without residual deficits: Secondary | ICD-10-CM

## 2018-10-12 DIAGNOSIS — E785 Hyperlipidemia, unspecified: Secondary | ICD-10-CM

## 2018-10-12 DIAGNOSIS — R410 Disorientation, unspecified: Secondary | ICD-10-CM

## 2018-10-12 DIAGNOSIS — I5023 Acute on chronic systolic (congestive) heart failure: Secondary | ICD-10-CM

## 2018-10-12 DIAGNOSIS — N182 Chronic kidney disease, stage 2 (mild): Secondary | ICD-10-CM

## 2018-10-12 DIAGNOSIS — I10 Essential (primary) hypertension: Secondary | ICD-10-CM

## 2018-10-12 DIAGNOSIS — R5383 Other fatigue: Secondary | ICD-10-CM

## 2018-10-12 DIAGNOSIS — Z7901 Long term (current) use of anticoagulants: Secondary | ICD-10-CM

## 2018-10-12 DIAGNOSIS — I2510 Atherosclerotic heart disease of native coronary artery without angina pectoris: Secondary | ICD-10-CM

## 2018-10-12 DIAGNOSIS — I130 Hypertensive heart and chronic kidney disease with heart failure and stage 1 through stage 4 chronic kidney disease, or unspecified chronic kidney disease: Secondary | ICD-10-CM

## 2018-10-12 DIAGNOSIS — I4819 Other persistent atrial fibrillation: Secondary | ICD-10-CM

## 2018-10-12 DIAGNOSIS — I255 Ischemic cardiomyopathy: Secondary | ICD-10-CM

## 2018-10-12 DIAGNOSIS — I5022 Chronic systolic (congestive) heart failure: Secondary | ICD-10-CM

## 2018-10-12 DIAGNOSIS — R001 Bradycardia, unspecified: Secondary | ICD-10-CM

## 2018-10-12 DIAGNOSIS — I6522 Occlusion and stenosis of left carotid artery: Secondary | ICD-10-CM

## 2018-10-12 NOTE — Telephone Encounter (Signed)
Attempted to reach patient to change to Dr Windell Hummingbird schedule today at the 4:20pm slot. No answer. Will keep with [name] as scheduled at this point.

## 2018-10-12 NOTE — Telephone Encounter (Signed)
Please have patient increase his torsemide to 40 mg bid x 3 days then go back down to 20 mg bid thereafter. Given his underlying CKD and that he is on spironolactone, I will not titrate his KCl at this time. He will need a BMP first part of next week to assess his renal function following outpatient diuresis and should follow up with MD for E-visit later this week or first of next week as discussed.

## 2018-10-12 NOTE — Progress Notes (Signed)
Virtual Visit via Telephone Note   This visit type was conducted due to national recommendations for restrictions regarding the COVID-19 Pandemic (e.g. social distancing) in an effort to limit this patient's exposure and mitigate transmission in our community.  Due to his co-morbid illnesses, this patient is at least at moderate risk for complications without adequate follow up.  This format is felt to be most appropriate for this patient at this time.  The patient did not have access to video technology/had technical difficulties with video requiring transitioning to audio format only (telephone).  All issues noted in this document were discussed and addressed.  No physical exam could be performed with this format.  Please refer to the patient's chart for his  consent to telehealth for [facility], [facility], [facility].   Evaluation Performed:  Follow-up visit  Date:  10/12/2018   ID:  [name], DOB 10/31/1955, MRN [id number]  Patient Location: Home  Provider Location: Home  PCP:  [facility]  Cardiologist:  [facility]  Electrophysiologist:  None   Chief Complaint:  Confusion, lethargy, weight gain, abdominal fullness  History of Present Illness:    [name] is a 63 y.o. male with history of CAD status post prior stenting to the RCA and rPDA, HFrEF with an EF of 30 to 35% by echo in 05/2018 due to ICM, persistent A. fib/flutter on Xarelto, carotid artery disease status post recent stroke in 05/2018 pending right sided CEA, CKD stage II, COPD secondary to tobacco abuse, diabetes, hypertension, hyperlipidemia, and obesity and was a work in for a virtual visit for a several day history of lethargy, increased confusion, weight gain, and abdominal fullness.   He has previously been followed by cardiology at the [facility].  His cardiac history dates back to at least 2008 with an abnormal stress test at that time.  This was followed by cardiac cath with PCI to the  distal RCA and rPDA in 2009.  He reportedly had repeat stenting in 2017 and was hospitalized in 06/2016 with CHF and a new finding of LV dysfunction with an EF of 25 to 30%.  Cath at that time showed a patent RCA/rPDA and LCx stents.  He was medically managed.  In 02/2017, he was admitted with A. fib/flutter with RVR and underwent successful cardioversion.  He was again admitted in 12/2017 with recurrent A. fib/flutter and CHF exacerbation.  He was rate controlled and maintained on Xarelto with a plan for outpatient cardioversion.  Patient followed up with the [facility] and it was unclear if he ever underwent cardioversion.  He was admitted in 05/2018 with slurred speech and facial droop.  MRI showed bilateral cerebral stroke and CTA of the head/neck showed severe right carotid artery disease.  He was seen by vascular surgery with plans for outpatient right-sided CEA, however this was delayed in the setting of seeking cardiology clearance from his outpatient cardiologist.  He was admitted to the hospital in 06/2018 with sepsis secondary to pyelonephritis, CHF exacerbation, A. fib with RVR, and AKI.  He was loaded with amiodarone and underwent successful cardioversion on 07/30/2018 with subsequent notation of sinus bradycardia leading to tapering of Coreg.  His Xarelto was renally dosed given his AKI.  Troponin peaked at 0.43 and was felt to be demand ischemia in the setting of sepsis and A. fib with RVR with AKI.  In hospital follow-up on 08/10/2018, he was doing well from a cardiac perspective.  His weight had continued  to decrease following his discharge.  CXR was performed at the patient's request given this was requested by his PCP for follow-up which showed cardiomegaly with mild vascular congestion and no frank CHF or pneumonia.  He was subsequently been admitted to the hospital in late 07/2018 for influenza B.  He was most recently seen in the office in 08/2018 and was doing well. His weight was down 7  pounds when compared to his visit in 07/2018 ([id number] pounds). Renal function checked at that time showed an improved/stable SCr of 1.71.  His wife called our office this week noting the patients weight had increased from 209 pounds on 4/9 to 215.6 pounds on 4/13 with associated abdominal fullness. It was noted the patient had not been following a low salt diet.   In talking with the patient today there is significant confusion on his part.  He indicates the home health nurse was last at his house 4 to 5 days prior and states his blood pressure was "good" at that time.  About a minute later in our conversation he indicates the home health nurse was at his house earlier today and he saw her while he was in the car.  She did not check his blood pressure or heart rate today.  Initially, I was seeing him for volume overload as his weight over the past 4 to 5 days has increased from 207 pounds to a max of 215 pounds.  He reports compliance with torsemide 20 mg twice daily.  Nursing note indicates the patient does not follow a low-salt diet however the patient denies this.  He states he washes off his canned foods and does not add salt to his food.  He reports eating cold cut meats approximately once per week through [employer].  There is associated abdominal fullness without orthopnea or early satiety.  He does note left knee and ankle swelling though attributes this to a recent mechanical fall in which he had an effusion of the left knee requiring arthrocentesis.  In talking with the patient's wife over the phone today she states "he is not himself."  When asked to further elaborate on this she states he is lethargic and confused which is not his baseline.  Historically, his blood pressure has been well controlled at his office visits with a heart rate in the 80s bpm.  It was noted by his home brachial BP cuff he had a BP of 174/91 with a heart rate of 40 bpm.  It is uncertain if these readings are truly accurate  are not.  However, they do concern his wife.  Labs: 08/2018 - SCr 1.71, K+ 3.8  The patient does not have symptoms concerning for COVID-19 infection (fever, chills, cough, or new shortness of breath).    Past Medical History:  Diagnosis Date   CAD in native artery    a. stress echo 12/2007 abnl, EF > 55%, b. LHC 01/28/08: mLAD 30, D1 40, dLCx 70, pRCA 30, mRCA 70, mRCA lesion 2 80, PDA 90, s/p PCI/BMS to prox and distal RCA, s/p PCI/BMS to PDA; c. patient reports PCI/stenting x 2 in early 2017 at the [place] (no records on file) d. 06/2016: cath showing patent stents along RCA and LCx with moderate 40% stenosis along the LAD.    Carotid arterial disease (HCC)    a. 05/2018 CTA Head/neck: 80-90% stenosis @ L carotid bifurcation, extending into the prox LICA. 60% stenosis @ R carotid bifurcation. 50-75% bilateral distal cavernous carotid dzs.  Chronic combined systolic (congestive) and diastolic (congestive) heart failure (HCC)    a. 2009 Echo: > 55%; b. 06/2016: EF 25-30%; c. 12/2017 Echo: EF 30-35%, diff HK; d. 05/2018 Echo: 30-35%, mild conc LVH, diff HK. Mild MR. Mildly dil LA/RA.   Chronic kidney disease    COPD (chronic obstructive pulmonary disease) (HCC)    Diabetes mellitus without complication (HCC)    Gout    Hyperlipidemia    Hypertension    Hypertensive heart disease    Ischemic cardiomyopathy    a. 05/2018: 30-35%.   Obesity    Persistent atrial fibrillation 01/2017   a. cardioverted 9/18 to NSR; b. 12/2017 noted to be back in Afib-outpt dccv rec; c. CHA2DS2VASc = 6-->Xarelto.   Stroke (cerebrum) (HCC)    a. 05/2018 MRI: small patchy acute to subacute cortial and white matter infarct in the bilat cerebrum. Mild chronic small vessel ischmia; b. 05/2018 Carotid U/S: L-carotid bifurcation 80-90% stenosed, R- carotid bifurcation 60% stenosed.   Tobacco abuse    Past Surgical History:  Procedure Laterality Date   CARDIAC CATHETERIZATION  2009   [facility];    CARDIAC  CATHETERIZATION  2010   CARDIAC CATHETERIZATION N/A 07/28/2016   Procedure: Right and Left Heart Cath and possible PCI;  Surgeon: Iran Ouch, MD;  Location: [facility] INVASIVE CV LAB;  Service: Cardiovascular;  Laterality: N/A;   CARDIOVERSION N/A 03/27/2017   Procedure: CARDIOVERSION;  Surgeon: Iran Ouch, MD;  Location: [facility] ORS;  Service: Cardiovascular;  Laterality: N/A;   CARDIOVERSION N/A 07/30/2018   Procedure: CARDIOVERSION;  Surgeon: Antonieta Iba, MD;  Location: [facility] ORS;  Service: Cardiovascular;  Laterality: N/A;   CORONARY ANGIOPLASTY  2009   s/p stent placement at [facility].     Current Meds  Medication Sig   albuterol-ipratropium (COMBIVENT) 18-103 MCG/ACT inhaler Inhale 2 puffs into the lungs every 4 (four) hours as needed for wheezing.   amiodarone (PACERONE) 200 MG tablet Take 200 mg by mouth daily.   atorvastatin (LIPITOR) 80 MG tablet Take 80 mg by mouth daily.   budesonide-formoterol (SYMBICORT) 160-4.5 MCG/ACT inhaler Inhale 2 puffs into the lungs 2 (two) times daily.   carvedilol (COREG) 6.25 MG tablet Take 1 tablet (6.25 mg total) by mouth 2 (two) times daily with a meal.   docusate sodium (COLACE) 100 MG capsule Take 1 capsule (100 mg total) by mouth 2 (two) times daily.   isosorbide-hydrALAZINE (BIDIL) 20-37.5 MG tablet Take 1 tablet by mouth 3 (three) times daily.   magnesium oxide (MAG-OX) 400 MG tablet Take 400 mg by mouth 2 (two) times daily.   nitroGLYCERIN (NITROSTAT) 0.4 MG SL tablet Place 0.4 mg under the tongue every 5 (five) minutes as needed for chest pain.   potassium chloride SA (K-DUR,KLOR-CON) 20 MEQ tablet Take 20 mEq by mouth daily.   rivaroxaban (XARELTO) 15 MG TABS tablet Take 1 tablet (15 mg total) by mouth daily with supper.   spironolactone (ALDACTONE) 25 MG tablet Take 1 tablet (25 mg total) by mouth daily.   torsemide (DEMADEX) 20 MG tablet Take 1 tablet (20 mg total) by mouth 2 (two) times daily.   traZODone (DESYREL)  50 MG tablet Take 50 mg by mouth at bedtime.     Allergies:   Aspirin; Ibuprofen; and Tylenol [acetaminophen]   Social History   Tobacco Use   Smoking status: Former Smoker    Packs/day: 1.00    Years: 35.00    Pack years: 35.00    Types: Cigarettes  Smokeless tobacco: Never Used  Substance Use Topics   Alcohol use: Not Currently    Comment: Quit 8 yrs.  Used to drink heavily   Drug use: No     Family Hx: The patient's family history includes Cancer in his brother; Heart attack (age of onset: 6) in his mother; Hypertension in his mother.  ROS:   Please see the history of present illness.     All other systems reviewed and are negative.   Prior CV studies:   The following studies were reviewed today:  R/LHC 06/2016:  Prox RCA lesion, 0 %stenosed.  Mid RCA lesion, 10 %stenosed.  Mid Cx lesion, 0 %stenosed.  Mid Cx to Dist Cx lesion, 0 %stenosed.  Prox Cx lesion, 40 %stenosed.  Prox LAD lesion, 40 %stenosed.  Mid LAD lesion, 40 %stenosed.  Dist LAD lesion, 30 %stenosed.  LV end diastolic pressure is severely elevated.   1. Patent stents in the right coronary artery and left circumflex with no significant restenosis. Moderate LAD disease.  2. Right heart catheterization showed severely elevated filling pressures and severe pulmonary hypertension. RV pressure was 82/18, PDA 81/43 with a mean of 57. RA pressure was 11. Left ventricular end-diastolic pressure was 34 mmHg. Pulmonary capillary wedge pressure could not be obtained due to inability to advance the Swan-Ganz catheter. Cardiac output is 4.44 with a cardiac index of 1.94.  Recommendations: Continue medical therapy for coronary artery disease and systolic heart failure. The patient continues to be severely volume overloaded and will need another few days of IV diuresis. __________  2D Echo 12/2017: - Left ventricle: The cavity size was at the upper limits of   normal. Wall thickness was normal.  Systolic function was   moderately to severely reduced. The estimated ejection fraction   was in the range of 30% to 35%. Diffuse hypokinesis. - Aortic valve: Mildly thickened, mildly calcified leaflets.   Transvalvular velocity was within the normal range. There was no   stenosis. - Mitral valve: Mildly thickened leaflets . There was mild   regurgitation. - Left atrium: The atrium was mildly dilated. - Right ventricle: The cavity size was normal. Wall thickness was   normal. Systolic function was mildly reduced. - Right atrium: The atrium was mildly dilated. __________  Labs/Other Tests and Data Reviewed:    EKG:  No ECG reviewed.  Recent Labs: 03/04/2018: B Natriuretic Peptide 524.0 07/31/2018: Magnesium 2.2 08/10/2018: TSH 5.829 08/18/2018: Hemoglobin 16.0; Platelets 157 08/19/2018: ALT 30 09/09/2018: BUN 24; Creatinine, Ser 1.71; Potassium 3.8; Sodium 137   Recent Lipid Panel Lab Results  Component Value Date/Time   CHOL 139 06/14/2018 05:17 AM   CHOL 191 12/09/2012 04:39 AM   TRIG 135 06/14/2018 05:17 AM   TRIG 78 12/09/2012 04:39 AM   HDL 33 (L) 06/14/2018 05:17 AM   HDL 54 12/09/2012 04:39 AM   CHOLHDL 4.2 06/14/2018 05:17 AM   LDLCALC 79 06/14/2018 05:17 AM   LDLCALC 121 (H) 12/09/2012 04:39 AM    Wt Readings from Last 3 Encounters:  10/12/18 214 lb 2 oz (97.1 kg)  09/27/18 207 lb 10.8 oz (94.2 kg)  09/09/18 207 lb 12 oz (94.2 kg)     Objective:    Vital Signs:  BP (!) 174/91 (BP Location: Left Arm, Patient Position: Sitting)    Pulse (!) 40    Ht  (1.803 m)    Wt 214 lb 2 oz (97.1 kg)    BMI 29.86 kg/m    Well  nourished, well developed male in no acute distress.   ASSESSMENT & PLAN:    1. Lethargy/confusion/bradycardia: It is uncertain if his self-reported BP and heart rate are accurate though with his associated terms of lethargy and confusion which are new when compared to his baseline this is concerning.  He is on low-dose carvedilol 6.25 mg twice  daily and amiodarone 200 mg daily which both could affect his heart rate.  However, he needs to be further evaluated in person to evaluate if he has significant AV block.  I have recommended he proceed to the ED for further evaluation and management.  Cardiology follow-up pending ED evaluation.  2. Acute on chronic HFrEF secondary to ICM: Likely in the setting of dietary noncompliance with increased sodium intake.  Given the above recommendation to proceed to the ED I will hold off on outpatient diuresis.  This can be reassessed either in the ED or as an outpatient pending his work-up this evening.  At a minimum, his torsemide should be briefly titrated for several days with close monitoring of his renal function thereafter given his underlying kidney disease.  His Coreg will need to be held at this time pending further evaluation of his bradycardic heart rate.  Otherwise, he should remain on Imdur and hydralazine and spironolactone, as long as renal function allows the latter.  CHF education with daily weights is recommended.  3. CAD involving native coronary arteries without angina: He denies any chest pain.  He was noted to have a mildly elevated troponin during recent admission for sepsis.  Outpatient ischemic evaluation was recommended and has been ordered though not yet completed, possibly in the setting of recommended social distancing from COVID-19.  He remains on Xarelto in place of aspirin.  Coreg has been held as above.  Continue Lipitor and aggressive risk factor modification.  4. Persistent A. fib: Status post cardioversion as outlined above.  At his last office visit, he was maintaining sinus rhythm.  He has a self-reported heart rate of 40 bpm this evening at the time of his virtual office visit and I am unable to evaluate this further given social distancing.  Coreg and amiodarone have been held as above.  Continue Xarelto.  Based on his creatinine clearance is dosing it may need to be  changed.  5. CKD stage II: He will need close monitoring of his renal function with diuresis.  6. Carotid artery disease/recent CVA: Some of his lethargy and confusion certainly may be in the setting of his recent stroke however with his noted bradycardic heart rate as above he needs further in person evaluation.  His carotid artery disease is followed by vascular surgery and they are planning for right-sided CEA once he is deemed medically stable.  Ischemic evaluation for preoperative cardiac evaluation is pending as above.  Continue optimal blood pressure and lipid control.  7. Hypertension: His blood pressure has been well controlled at his office visit and he reports home health nurse has told him his blood pressure is "normal."  He was noted to have elevated blood pressure as outlined above at the time of our office visit this afternoon though I am unable to corroborate this objectively.  As above.  8. Hyperlipidemia: Remains on atorvastatin with goal LDL less than 70.  COVID-19 Education: The signs and symptoms of COVID-19 were discussed with the patient and how to seek care for testing (follow up with PCP or arrange E-visit).  The importance of social distancing was discussed today.  Time:   Today, I have spent 21 minutes with the patient with telehealth technology discussing the above problems.    Medication Adjustments/Labs and Tests Ordered: Current medicines are reviewed at length with the patient today.  Concerns regarding medicines are outlined above.   Tests Ordered: No orders of the defined types were placed in this encounter.   Medication Changes: No orders of the defined types were placed in this encounter.   Disposition:  Follow up after ED evaluation   Signed, Eula Listen, PA-C  10/12/2018 4:23 PM    [facility]

## 2018-10-12 NOTE — Telephone Encounter (Signed)
Patient spouse calling  States since the visit with R Dunn the HR has come up to 66 but the BP has dropped a little, may not be going to hospital Patient would like clarification on what dosage to take for the fluid pill  Please call to discuss

## 2018-10-12 NOTE — Telephone Encounter (Signed)
HR up to 66, BP increased to 160/90 (approximately, not sure). EMS did come to the house but patient refused to go with them to the hospital.  EMS told him they recommend him going but cannot make him. Patient refused. Discussed that it was strongly advised in office visit for patient to go to the hospital. Advised significant other to call 911 if needed and I will make Ryan aware.

## 2018-10-12 NOTE — Patient Instructions (Addendum)
Medication Instructions:  Your physician recommends that you continue on your current medications as directed. Please refer to the Current Medication list given to you today.  If you need a refill on your cardiac medications before your next appointment, please call your pharmacy.   Lab work: None ordered If you have labs (blood work) drawn today and your tests are completely normal, you will receive your results only by: [name] MyChart Message (if you have MyChart) OR . A paper copy in the mail If you have any lab test that is abnormal or we need to change your treatment, we will call you to review the results.  Testing/Procedures: None ordered  Follow-Up: At [facility], you and your health needs are our priority.  As part of our continuing mission to provide you with exceptional heart care, we have created designated Provider Care Teams.  These Care Teams include your primary Cardiologist (physician) and Advanced Practice Providers (APPs -  Physician Assistants and Nurse Practitioners) who all work together to provide you with the care you need, when you need it.  Your physician recommends that you schedule a follow-up appointment in: 1 week e-visit  if not admitted with MD only  Any Other Special Instructions Will Be Listed Below (If Applicable). Please report to [facility] ED for evaluation

## 2018-10-12 NOTE — Telephone Encounter (Signed)
TELEPHONE CALL NOTE  [name] has been deemed a candidate for a follow-up tele-health visit to limit community exposure during the Covid-19 pandemic. I spoke with the patient via phone to ensure availability of phone/video source, confirm preferred email & phone number, discuss instructions and expectations, and review consent.   I reminded Shirley Dudoit to be prepared with any vital sign and/or heart rhythm information that could potentially be obtained via home monitoring, at the time of his visit.  Finally, I reminded Cheikh Ronningen to expect an e-mail containing a link for their video-based visit approximately 15 minutes before his visit, or alternatively, a phone call at the time of his visit if his visit is planned to be a phone encounter.  Did the patient verbally consent to treatment as below? YES  [name], CMA 10/12/2018 10:33 AM  CONSENT FOR TELE-HEALTH VISIT - PLEASE REVIEW  I hereby voluntarily request, consent and authorize [facility] and its employed or contracted physicians, physician assistants, nurse practitioners or other licensed health care professionals (the Practitioner), to provide me with telemedicine health care services (the "Services") as deemed necessary by the treating Practitioner. I acknowledge and consent to receive the Services by the Practitioner via telemedicine. I understand that the telemedicine visit will involve communicating with the Practitioner through telephonic communication technology and the disclosure of certain medical information by electronic transmission. I acknowledge that I have been given the opportunity to request an in-person assessment or other available alternative prior to the telemedicine visit and am voluntarily participating in the telemedicine visit.  I understand that I have the right to withhold or withdraw my consent to the use of telemedicine in the course of my care at any time, without affecting my right  to future care or treatment, and that the Practitioner or I may terminate the telemedicine visit at any time. I understand that I have the right to inspect all information obtained and/or recorded in the course of the telemedicine visit and may receive copies of available information for a reasonable fee.  I understand that some of the potential risks of receiving the Services via telemedicine include:  [name] Delay or interruption in medical evaluation due to technological equipment failure or disruption; . Information transmitted may not be sufficient (e.g. poor resolution of images) to allow for appropriate medical decision making by the Practitioner; and/or  . In rare instances, security protocols could fail, causing a breach of personal health information.  Furthermore, I acknowledge that it is my responsibility to provide information about my medical history, conditions and care that is complete and accurate to the best of my ability. I acknowledge that Practitioner's advice, recommendations, and/or decision may be based on factors not within their control, such as incomplete or inaccurate data provided by me or lack of visual representation. I understand that the practice of medicine is not an exact science and that Practitioner makes no warranties or guarantees regarding treatment outcomes. I acknowledge that I will receive a copy of this consent concurrently upon execution via email to the email address I last provided but may also request a printed copy by calling the office of [facility].    I understand that my insurance may be billed for this visit.   I have read or had this consent read to me. . I understand the contents of this consent, which adequately explains the benefits and risks of the Services being provided via telemedicine.  . I have been  provided ample opportunity to ask questions regarding this consent and the Services and have had my questions answered to my  satisfaction. . I give my informed consent for the services to be provided through the use of telemedicine in my medical care  By participating in this telemedicine visit I agree to the above.

## 2018-10-12 NOTE — Telephone Encounter (Signed)
Spoke with Eula Listen, PA ok to keep patient as evisit with him or Dr Mariah Milling.

## 2018-10-12 NOTE — Telephone Encounter (Signed)
SPoke with patient's wife, ok per DPR.  Patient has not been able to weight today. Larey Seat a few weeks ago and left knee was injured. His left knee and stomach are swelling. Patient has been laying around and not up and moving about at all. She is agreeable to virtual visit today. She will have her grandson help to download the Cisco. Advised her to have patient weigh, BP and HR prior to the call for appointment in order to help better treat him. She verbalized understanding.       Virtual Visit Pre-Appointment Phone Call  Steps For Call:  1. Confirm consent - "In the setting of the current Covid19 crisis, you are scheduled for a (phone or video) visit with your provider on (date) at (time).  Just as we do with many in-office visits, in order for you to participate in this visit, we must obtain consent.  If you'd like, I can send this to your mychart (if signed up) or email for you to review.  Otherwise, I can obtain your verbal consent now.  All virtual visits are billed to your insurance company just like a normal visit would be.  By agreeing to a virtual visit, we'd like you to understand that the technology does not allow for your provider to perform an examination, and thus may limit your provider's ability to fully assess your condition.  Finally, though the technology is pretty good, we cannot assure that it will always work on either your or our end, and in the setting of a video visit, we may have to convert it to a phone-only visit.  In either situation, we cannot ensure that we have a secure connection.  Are you willing to proceed?" STAFF: Did the patient verbally acknowledge consent to treatment? yes  2. Give patient instructions for WebEx/MyChart download to smartphone or Doximity/Doxy.me if video visit (depending on what platform provider is using)  3. Advise patient to be prepared with any vital sign or heart rhythm information, their current medicines, and a piece of paper and  pen handy for any instructions they may receive the day of their visit  4. Inform patient they will receive a phone call 15 minutes prior to their appointment time (may be from unknown caller ID) so they should be prepared to answer  5. Confirm that appointment type is correct in Epic appointment notes (video vs telephone)    TELEPHONE CALL NOTE  [name] has been deemed a candidate for a follow-up tele-health visit to limit community exposure during the Covid-19 pandemic. I spoke with the patient via phone to ensure availability of phone/video source, confirm preferred email & phone number, and discuss instructions and expectations.  I reminded [name] to be prepared with any vital sign and/or heart rhythm information that could potentially be obtained via home monitoring, at the time of his visit. I reminded [name] to expect a phone call at the time of his visit if his visit.  Catalina Gravel, RN 10/12/2018 11:51 AM   DOWNLOADING THE WEBEX SOFTWARE TO SMARTPHONE  - If Apple, go to [employer] and type in WebEx in the search bar. Download [employer], the blue/green circle. The app is free but as with any other app downloads, their phone may require them to verify saved payment information or Apple password. The patient does NOT have to create an account.  - If Android, ask patient to go to [employer] and type in Suarez in  the search bar. Download [employer] Meetings, the blue/green circle. The app is free but as with any other app downloads, their phone may require them to verify saved payment information or Android password. The patient does NOT have to create an account.   CONSENT FOR TELE-HEALTH VISIT - PLEASE REVIEW  I hereby voluntarily request, consent and authorize [facility] and its employed or contracted physicians, physician assistants, nurse practitioners or other licensed health care professionals (the Practitioner), to provide me with  telemedicine health care services (the "Services") as deemed necessary by the treating Practitioner. I acknowledge and consent to receive the Services by the Practitioner via telemedicine. I understand that the telemedicine visit will involve communicating with the Practitioner through live audiovisual communication technology and the disclosure of certain medical information by electronic transmission. I acknowledge that I have been given the opportunity to request an in-person assessment or other available alternative prior to the telemedicine visit and am voluntarily participating in the telemedicine visit.  I understand that I have the right to withhold or withdraw my consent to the use of telemedicine in the course of my care at any time, without affecting my right to future care or treatment, and that the Practitioner or I may terminate the telemedicine visit at any time. I understand that I have the right to inspect all information obtained and/or recorded in the course of the telemedicine visit and may receive copies of available information for a reasonable fee.  I understand that some of the potential risks of receiving the Services via telemedicine include:  [name]. Delay or interruption in medical evaluation due to technological equipment failure or disruption; . Information transmitted may not be sufficient (e.g. poor resolution of images) to allow for appropriate medical decision making by the Practitioner; and/or  . In rare instances, security protocols could fail, causing a breach of personal health information.  Furthermore, I acknowledge that it is my responsibility to provide information about my medical history, conditions and care that is complete and accurate to the best of my ability. I acknowledge that Practitioner's advice, recommendations, and/or decision may be based on factors not within their control, such as incomplete or inaccurate data provided by me or distortions of diagnostic  images or specimens that may result from electronic transmissions. I understand that the practice of medicine is not an exact science and that Practitioner makes no warranties or guarantees regarding treatment outcomes. I acknowledge that I will receive a copy of this consent concurrently upon execution via email to the email address I last provided but may also request a printed copy by calling the office of [facility].    I understand that my insurance will be billed for this visit.   I have read or had this consent read to me. . I understand the contents of this consent, which adequately explains the benefits and risks of the Services being provided via telemedicine.  . I have been provided ample opportunity to ask questions regarding this consent and the Services and have had my questions answered to my satisfaction. . I give my informed consent for the services to be provided through the use of telemedicine in my medical care  By participating in this telemedicine visit I agree to the above.

## 2018-10-12 NOTE — Telephone Encounter (Signed)
   TELEPHONE CALL NOTE  This patient has been deemed a candidate for follow-up tele-health visit to limit community exposure during the Covid-19 pandemic. I spoke with the patient via phone to discuss instructions. The patient was advised to review the section on consent for treatment as well. The patient will receive a phone call 2-3 days prior to their E-Visit at which time consent will be verbally confirmed. A Virtual Office Visit appointment type has been scheduled for 10/13/2018 with Clarisa Kindred FNP.  [name], CMA 10/12/2018 10:33 AM

## 2018-10-13 ENCOUNTER — Telehealth: Payer: Self-pay | Admitting: Family

## 2018-10-13 ENCOUNTER — Ambulatory Visit: Payer: Medicare Other | Attending: Family | Admitting: Family

## 2018-10-13 ENCOUNTER — Encounter: Payer: Self-pay | Admitting: Family

## 2018-10-13 ENCOUNTER — Other Ambulatory Visit: Payer: Self-pay

## 2018-10-13 VITALS — HR 67 | Wt 214.0 lb

## 2018-10-13 DIAGNOSIS — I10 Essential (primary) hypertension: Secondary | ICD-10-CM

## 2018-10-13 DIAGNOSIS — I5023 Acute on chronic systolic (congestive) heart failure: Secondary | ICD-10-CM

## 2018-10-13 NOTE — Telephone Encounter (Signed)
Called to give Ryan's recommendation. lmtcb.

## 2018-10-13 NOTE — Patient Instructions (Signed)
Continue weighing daily and call for an overnight weight gain of > 2 pounds or a weekly weight gain of >5 pounds. 

## 2018-10-13 NOTE — Telephone Encounter (Signed)
Spoke with the pt wife. She is aware of Ryan's recommendation. Pt will increase torsemide 40mg  bid x3 days then down to 20mg  bid. Order placed for BMP and 1 wk telephone e-visit scheduled with Dr.Gollan 10/19/18. Pt will have lab drawn on 4/20 @ [facility]. Pt wife verbalized understanding and voiced appreciation for the call.

## 2018-10-13 NOTE — Telephone Encounter (Signed)
Called at scheduled time to do virtual telephone visit. Wife answered the phone and said that she was currently at the grocery store and that she would call me back once she returns home. Verified that she had our correct number.

## 2018-10-13 NOTE — Progress Notes (Signed)
Virtual Visit via Telephone Note    Evaluation Performed:  Follow-up visit  This visit type was conducted due to national recommendations for restrictions regarding the COVID-19 Pandemic (e.g. social distancing).  This format is felt to be most appropriate for this patient at this time.  All issues noted in this document were discussed and addressed.  No physical exam was performed (except for noted visual exam findings with Video Visits).  Please refer to the patient's chart (MyChart message for video visits and phone note for telephone visits) for the patient's consent to telehealth for [facility]  Date:  10/13/2018   ID:  [name], DOB [birth year], MRN [id number]  Patient Location:  [address] [place] [address]   Provider location:   [facility] [address] [place], [place] [address]  PCP:  [facility]  Cardiologist:  Eula Listen, PA Electrophysiologist:  None   Chief Complaint:  Swelling in legs  History of Present Illness:    [name] is a 63 y.o. male who presents via audio/video conferencing for a telehealth visit today.  Patient verified DOB and address.  The patient does not have symptoms concerning for COVID-19 infection (fever, chills, cough, or new SHORTNESS OF BREATH).   Patient reports increasing weight over the last week. He had a telemedicine visit with cardiology yesterday who advised him to increase his diuretic for 3 days which patient started last night. He has not weighed himself yet today but says that his weight yesterday was 214 lbs and the day prior it was 214.2 lbs but last week it was reported as 209 lbs. He notices associated swelling in his legs and is also having pain in his left knee making it difficult to get up and move around. He denies any fatigue, difficulty sleeping (wearing CPAP nightly), sore throat, cough, shortness of breath, chest pain, palpitations or dizziness. Says  that he's not adding salt to his food and rinses out his canned vegetables. He does eat out ~ twice / week and may eat at [employer] or get a sausage biscuit somewhere.   Prior CV studies:   The following studies were reviewed today:  Echo report from 06/14/18 reviewed and showed an EF of 30-35%.  Past Medical History:  Diagnosis Date  . CAD in native artery    a. stress echo 12/2007 abnl, EF > 55%, b. LHC 01/28/08: mLAD 30, D1 40, dLCx 70, pRCA 30, mRCA 70, mRCA lesion 2 80, PDA 90, s/p PCI/BMS to prox and distal RCA, s/p PCI/BMS to PDA; c. patient reports PCI/stenting x 2 in early 2017 at the [place] (no records on file) d. 06/2016: cath showing patent stents along RCA and LCx with moderate 40% stenosis along the LAD.   [name] Carotid arterial disease (HCC)    a. 05/2018 CTA Head/neck: 80-90% stenosis @ L carotid bifurcation, extending into the prox LICA. 60% stenosis @ R carotid bifurcation. 50-75% bilateral distal cavernous carotid dzs.  . Chronic combined systolic (congestive) and diastolic (congestive) heart failure (HCC)    a. 2009 Echo: > 55%; b. 06/2016: EF 25-30%; c. 12/2017 Echo: EF 30-35%, diff HK; d. 05/2018 Echo: 30-35%, mild conc LVH, diff HK. Mild MR. Mildly dil LA/RA.  [name] Chronic kidney disease   . COPD (chronic obstructive pulmonary disease) (HCC)   . Diabetes mellitus without complication (HCC)   . Gout   . Hyperlipidemia   . Hypertension   . Hypertensive heart disease   .  Ischemic cardiomyopathy    a. 05/2018: 30-35%.  . Obesity   . Persistent atrial fibrillation 01/2017   a. cardioverted 9/18 to NSR; b. 12/2017 noted to be back in Afib-outpt dccv rec; c. CHA2DS2VASc = 6-->Xarelto.  . Stroke (cerebrum) (HCC)    a. 05/2018 MRI: small patchy acute to subacute cortial and white matter infarct in the bilat cerebrum. Mild chronic small vessel ischmia; b. 05/2018 Carotid U/S: L-carotid bifurcation 80-90% stenosed, R- carotid bifurcation 60% stenosed.  . Tobacco abuse    Past Surgical  History:  Procedure Laterality Date  . CARDIAC CATHETERIZATION  2009   [facility];   . CARDIAC CATHETERIZATION  2010  . CARDIAC CATHETERIZATION N/A 07/28/2016   Procedure: Right and Left Heart Cath and possible PCI;  Surgeon: Iran OuchMuhammad A Arida, MD;  Location: [facility] INVASIVE CV LAB;  Service: Cardiovascular;  Laterality: N/A;  . CARDIOVERSION N/A 03/27/2017   Procedure: CARDIOVERSION;  Surgeon: Iran OuchArida, Muhammad A, MD;  Location: [facility] ORS;  Service: Cardiovascular;  Laterality: N/A;  . CARDIOVERSION N/A 07/30/2018   Procedure: CARDIOVERSION;  Surgeon: Antonieta IbaGollan, Timothy J, MD;  Location: [facility] ORS;  Service: Cardiovascular;  Laterality: N/A;  . CORONARY ANGIOPLASTY  2009   [facility]     Current Meds  Medication Sig  . albuterol-ipratropium (COMBIVENT) 18-103 MCG/ACT inhaler Inhale 2 puffs into the lungs every 4 (four) hours as needed for wheezing.  [name]. amiodarone (PACERONE) 200 MG tablet Take 200 mg by mouth daily.  [name]. atorvastatin (LIPITOR) 80 MG tablet Take 80 mg by mouth daily.  . budesonide-formoterol (SYMBICORT) 160-4.5 MCG/ACT inhaler Inhale 2 puffs into the lungs 2 (two) times daily.  . carvedilol (COREG) 6.25 MG tablet Take 1 tablet (6.25 mg total) by mouth 2 (two) times daily with a meal.  . docusate sodium (COLACE) 100 MG capsule Take 1 capsule (100 mg total) by mouth 2 (two) times daily.  . isosorbide-hydrALAZINE (BIDIL) 20-37.5 MG tablet Take 1.5 tablets by mouth 2 (two) times daily.  . magnesium oxide (MAG-OX) 400 MG tablet Take 400 mg by mouth 2 (two) times daily.  . nitroGLYCERIN (NITROSTAT) 0.4 MG SL tablet Place 0.4 mg under the tongue every 5 (five) minutes as needed for chest pain.  . potassium chloride SA (K-DUR,KLOR-CON) 20 MEQ tablet Take 20 mEq by mouth daily.  . rivaroxaban (XARELTO) 15 MG TABS tablet Take 1 tablet (15 mg total) by mouth daily with supper.  [name]. spironolactone (ALDACTONE) 25 MG tablet Take 1 tablet (25 mg total) by mouth daily.  [name]. torsemide (DEMADEX)  20 MG tablet Take 1 tablet (20 mg total) by mouth 2 (two) times daily.  . traZODone (DESYREL) 50 MG tablet Take 50 mg by mouth at bedtime.     Allergies:   Aspirin; Ibuprofen; and Tylenol [acetaminophen]   Social History   Tobacco Use  . Smoking status: Former Smoker    Packs/day: 1.00    Years: 35.00    Pack years: 35.00    Types: Cigarettes  . Smokeless tobacco: Never Used  Substance Use Topics  . Alcohol use: Not Currently    Comment: Quit 8 yrs.  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 Drug use: No     Family Hx: The patient's family history includes Cancer in his brother; Heart attack (age of onset: 6855) in his mother; Hypertension in his mother.  ROS:   Please see the history of present illness.     All other systems reviewed and are negative.   Labs/Other Tests  and Data Reviewed:    Recent Labs: 03/04/2018: B Natriuretic Peptide 524.0 07/31/2018: Magnesium 2.2 08/10/2018: TSH 5.829 08/18/2018: Hemoglobin 16.0; Platelets 157 08/19/2018: ALT 30 09/09/2018: BUN 24; Creatinine, Ser 1.71; Potassium 3.8; Sodium 137   Recent Lipid Panel Lab Results  Component Value Date/Time   CHOL 139 06/14/2018 05:17 AM   CHOL 191 12/09/2012 04:39 AM   TRIG 135 06/14/2018 05:17 AM   TRIG 78 12/09/2012 04:39 AM   HDL 33 (L) 06/14/2018 05:17 AM   HDL 54 12/09/2012 04:39 AM   CHOLHDL 4.2 06/14/2018 05:17 AM   LDLCALC 79 06/14/2018 05:17 AM   LDLCALC 121 (H) 12/09/2012 04:39 AM    Wt Readings from Last 3 Encounters:  10/13/18 214 lb (97.1 kg)  10/12/18 214 lb 2 oz (97.1 kg)  09/27/18 207 lb 10.8 oz (94.2 kg)     Exam:    Vital Signs:  Pulse 67 Comment: self-reported  Wt 214 lb (97.1 kg) Comment: self-reported  BMI 29.85 kg/m    Well nourished, well developed male in no  acute distress.   ASSESSMENT & PLAN:    1. Acute on chronic heart failure- - NYHA class I - fluid overloaded based on patient's description of swelling in his legs/ weight gain - already has had diuretic adjusted by  cardiology yesterday  - has not weighed himself yet today but he was reminded to weigh daily and call for an overnight weight gain of >2 pounds or a weekly weight gain of >5 pounds - he says that he's not adding salt but does eat out ~ twice / week. Reviewed that he'll get more salt in the foods when he eats out especially since he's eating subs or sausage biscuits - had telemedicine visit with cardiology Shea Evans) yesterday  2: HTN- - Patient says that he checks his BP but hasn't done so yet today and isn't ready to get out of the bed yet - normally sees a PCP at the [place] although his wife says that they are trying to get a local PCP - BMP from 09/09/2018 reviewed and showed sodium 137, potassium 3.8, creatinine 1.71 and GFR 49   COVID-19 Education: The signs and symptoms of COVID-19 were discussed with the patient and how to seek care for testing (follow up with PCP or arrange E-visit).  The importance of social distancing was discussed today.  Patient Risk:   After full review of this patients clinical status, I feel that they are at least moderate risk at this time.  Time:   Today, I have spent 14 minutes with the patient with telehealth technology discussing medications, diet, daily weighing and symptoms to call about.   Medication Adjustments/Labs and Tests Ordered: Current medicines are reviewed at length with the patient today.  Concerns regarding medicines are outlined above.   Tests Ordered: No orders of the defined types were placed in this encounter.  Medication Changes: No orders of the defined types were placed in this encounter.   Disposition:  Note from cardiology yesterday says they will do another telemedicine visit at the end of this week or beginning of next so will schedule patient for a phone visit from [place] in 1 month or sooner if needed.   Signed, Delma Freeze, FNP  10/13/2018 11:02 AM    [facility]

## 2018-10-14 ENCOUNTER — Ambulatory Visit: Payer: Self-pay | Admitting: Family

## 2018-10-15 ENCOUNTER — Telehealth: Payer: Self-pay

## 2018-10-15 NOTE — Telephone Encounter (Addendum)
Pt lexisacn is scheduled on 11/02/18 @8am . Called to give pre-test instructions. lmtcb

## 2018-10-18 NOTE — Progress Notes (Signed)
Did not pick up the phone for televisit This encounter was created in error - please disregard.

## 2018-10-19 ENCOUNTER — Encounter: Payer: Self-pay | Admitting: Cardiovascular Disease

## 2018-10-19 ENCOUNTER — Other Ambulatory Visit: Payer: Self-pay

## 2018-10-19 DIAGNOSIS — I25118 Atherosclerotic heart disease of native coronary artery with other forms of angina pectoris: Secondary | ICD-10-CM

## 2018-10-19 DIAGNOSIS — I10 Essential (primary) hypertension: Secondary | ICD-10-CM

## 2018-10-19 DIAGNOSIS — I5023 Acute on chronic systolic (congestive) heart failure: Secondary | ICD-10-CM

## 2018-10-19 DIAGNOSIS — R5383 Other fatigue: Secondary | ICD-10-CM

## 2018-10-19 DIAGNOSIS — N182 Chronic kidney disease, stage 2 (mild): Secondary | ICD-10-CM

## 2018-10-19 DIAGNOSIS — I255 Ischemic cardiomyopathy: Secondary | ICD-10-CM

## 2018-10-19 DIAGNOSIS — R410 Disorientation, unspecified: Secondary | ICD-10-CM

## 2018-10-19 DIAGNOSIS — I4819 Other persistent atrial fibrillation: Secondary | ICD-10-CM

## 2018-10-20 ENCOUNTER — Ambulatory Visit: Payer: Self-pay | Admitting: Internal Medicine

## 2018-11-02 ENCOUNTER — Encounter: Admission: RE | Admit: 2018-11-02 | Payer: Self-pay | Source: Ambulatory Visit

## 2018-11-02 ENCOUNTER — Ambulatory Visit: Payer: Self-pay

## 2018-11-12 ENCOUNTER — Telehealth: Payer: Self-pay | Admitting: Family

## 2018-11-12 NOTE — Telephone Encounter (Signed)
Patient did not return 2 messages left regarding [facility] Clinic telemedicine visit on 11/12/2018. Will attempt to reschedule.

## 2018-11-15 ENCOUNTER — Telehealth: Payer: Self-pay

## 2018-11-15 NOTE — Telephone Encounter (Signed)
   TELEPHONE CALL NOTE  This patient has been deemed a candidate for follow-up tele-health visit to limit community exposure during the Covid-19 pandemic. I spoke with the patient via phone to discuss instructions. The patient was advised to review the section on consent for treatment as well. The patient will receive a phone call 2-3 days prior to their E-Visit at which time consent will be verbally confirmed. A Virtual Office Visit appointment type has been scheduled for 11/18/2018 with [facility]  [name], CMA 11/15/2018 1:43 PM

## 2018-11-15 NOTE — Telephone Encounter (Signed)
TELEPHONE CALL NOTE  [name] has been deemed a candidate for a follow-up tele-health visit to limit community exposure during the Covid-19 pandemic. I spoke with the patient via phone to ensure availability of phone/video source, confirm preferred email & phone number, discuss instructions and expectations, and review consent.   I reminded [name] to be prepared with any vital sign and/or heart rhythm information that could potentially be obtained via home monitoring, at the time of his visit.  Finally, I reminded [name] to expect an e-mail containing a link for their video-based visit approximately 15 minutes before his visit, or alternatively, a phone call at the time of his visit if his visit is planned to be a phone encounter.  Did the patient verbally consent to treatment as below? YES  [name], CMA 11/15/2018 1:44 PM  CONSENT FOR TELE-HEALTH VISIT - PLEASE REVIEW  I hereby voluntarily request, consent and authorize [facility] and its employed or contracted physicians, physician assistants, nurse practitioners or other licensed health care professionals (the Practitioner), to provide me with telemedicine health care services (the "Services") as deemed necessary by the treating Practitioner. I acknowledge and consent to receive the Services by the Practitioner via telemedicine. I understand that the telemedicine visit will involve communicating with the Practitioner through telephonic communication technology and the disclosure of certain medical information by electronic transmission. I acknowledge that I have been given the opportunity to request an in-person assessment or other available alternative prior to the telemedicine visit and am voluntarily participating in the telemedicine visit.  I understand that I have the right to withhold or withdraw my consent to the use of telemedicine in the course of my care at any time, without affecting my right  to future care or treatment, and that the Practitioner or I may terminate the telemedicine visit at any time. I understand that I have the right to inspect all information obtained and/or recorded in the course of the telemedicine visit and may receive copies of available information for a reasonable fee.  I understand that some of the potential risks of receiving the Services via telemedicine include:  [name] Delay or interruption in medical evaluation due to technological equipment failure or disruption; . Information transmitted may not be sufficient (e.g. poor resolution of images) to allow for appropriate medical decision making by the Practitioner; and/or  . In rare instances, security protocols could fail, causing a breach of personal health information.  Furthermore, I acknowledge that it is my responsibility to provide information about my medical history, conditions and care that is complete and accurate to the best of my ability. I acknowledge that Practitioner's advice, recommendations, and/or decision may be based on factors not within their control, such as incomplete or inaccurate data provided by me or lack of visual representation. I understand that the practice of medicine is not an exact science and that Practitioner makes no warranties or guarantees regarding treatment outcomes. I acknowledge that I will receive a copy of this consent concurrently upon execution via email to the email address I last provided but may also request a printed copy by calling the office of [facility].    I understand that my insurance may be billed for this visit.   I have read or had this consent read to me. . I understand the contents of this consent, which adequately explains the benefits and risks of the Services being provided via telemedicine.  . I have been  provided ample opportunity to ask questions regarding this consent and the Services and have had my questions answered to my  satisfaction. . I give my informed consent for the services to be provided through the use of telemedicine in my medical care  By participating in this telemedicine visit I agree to the above.

## 2018-11-17 ENCOUNTER — Other Ambulatory Visit: Payer: Self-pay | Admitting: Family

## 2018-11-17 ENCOUNTER — Inpatient Hospital Stay: Admission: RE | Admit: 2018-11-17 | Payer: Self-pay | Source: Ambulatory Visit

## 2018-11-17 ENCOUNTER — Telehealth: Payer: Self-pay

## 2018-11-17 DIAGNOSIS — I5023 Acute on chronic systolic (congestive) heart failure: Secondary | ICD-10-CM

## 2018-11-17 NOTE — Telephone Encounter (Signed)
Mr. Busko home health nurse called the office to inform the provider of a 10 lb weight change. She states that on 5/13 he was weighing 206 lb  and today he was 213 lb. She states that he has swelling in the abdominal area but that his lower extremities are not showing signs of swelling. Upon assessment she did find the patient to have diminished lung sounds. However the patient does not complain of being "anymore short of breath then usual." He is currently taking 40 mg of Torsemide daily with 40 mEq of potassium.   Per Clarisa Kindred FNP patient will need to come in to have IV Lasix if possible today. I will contact patient to arrange IV lasix with same day surgery.

## 2018-11-17 NOTE — Progress Notes (Signed)
Home health nurse called to report a weight gain of 10 pounds over the last 1-2 weeks. Patient feels distended in the abdomen causing an increase in his shortness of breath. Currently taking 20mg  torsemide BID along with spironolactone 25mg  daily. Taking potassium daily along with this. Will schedule patient for IV lasix.

## 2018-11-18 ENCOUNTER — Ambulatory Visit
Admission: RE | Admit: 2018-11-18 | Discharge: 2018-11-18 | Disposition: A | Discharge status: Home / Self Care | Payer: Self-pay | Source: Ambulatory Visit | Attending: Family | Admitting: Family

## 2018-11-18 ENCOUNTER — Ambulatory Visit: Payer: Self-pay | Admitting: Family

## 2018-11-18 ENCOUNTER — Telehealth: Payer: Self-pay | Admitting: Family

## 2018-11-18 ENCOUNTER — Other Ambulatory Visit: Payer: Self-pay

## 2018-11-18 DIAGNOSIS — I5022 Chronic systolic (congestive) heart failure: Secondary | ICD-10-CM | POA: Insufficient documentation

## 2018-11-18 DIAGNOSIS — I5023 Acute on chronic systolic (congestive) heart failure: Secondary | ICD-10-CM

## 2018-11-18 LAB — BASIC METABOLIC PANEL
Anion gap: 8 (ref 5–15)
BUN: 25 mg/dL — ABNORMAL HIGH (ref 8–23)
CO2: 25 mmol/L (ref 22–32)
Calcium: 9.5 mg/dL (ref 8.9–10.3)
Chloride: 109 mmol/L (ref 98–111)
Creatinine, Ser: 1.62 mg/dL — ABNORMAL HIGH (ref 0.61–1.24)
GFR calc Af Amer: 52 mL/min — ABNORMAL LOW (ref >60)
GFR calc non Af Amer: 45 mL/min — ABNORMAL LOW (ref >60)
Glucose, Bld: 176 mg/dL — ABNORMAL HIGH (ref 70–99)
Potassium: 3.9 mmol/L (ref 3.5–5.1)
Sodium: 142 mmol/L (ref 135–145)

## 2018-11-18 LAB — BRAIN NATRIURETIC PEPTIDE: B Natriuretic Peptide: 1314 pg/mL — ABNORMAL HIGH (ref 0.0–100.0)

## 2018-11-18 MED ORDER — POTASSIUM CHLORIDE CRYS ER 20 MEQ PO TBCR
EXTENDED_RELEASE_TABLET | ORAL | Status: AC
  Administered 2018-11-18: 11:00:00 40 meq via ORAL
  Filled 2018-11-18: qty 2

## 2018-11-18 MED ORDER — SODIUM CHLORIDE FLUSH 0.9 % IV SOLN
INTRAVENOUS | Status: AC
  Administered 2018-11-18: 10 mL
  Filled 2018-11-18: qty 10

## 2018-11-18 MED ORDER — FUROSEMIDE 10 MG/ML IJ SOLN
INTRAMUSCULAR | Status: AC
  Administered 2018-11-18: 11:00:00 80 mg via INTRAVENOUS
  Filled 2018-11-18: qty 8

## 2018-11-18 MED ORDER — FUROSEMIDE 10 MG/ML IJ SOLN
80.0000 mg | Freq: Once | INTRAMUSCULAR | Status: AC
  Administered 2018-11-18: 11:00:00 80 mg via INTRAVENOUS

## 2018-11-18 MED ORDER — POTASSIUM CHLORIDE CRYS ER 20 MEQ PO TBCR
40.0000 meq | EXTENDED_RELEASE_TABLET | Freq: Once | ORAL | Status: AC
  Administered 2018-11-18: 40 meq via ORAL

## 2018-11-18 NOTE — Telephone Encounter (Signed)
Patient was scheduled for IV lasix yesterday but fell asleep and didn't make it so it was rescheduled for this morning after patient's wife called saying that his weight was still up to 215 pounds. Advised his wife that since he was still at same day surgery for the IV lasix, that we would not do his telemedicine visit today but, instead, would do it tomorrow at 10:40am. Patient's wife understood and was agreeable to this.

## 2018-11-19 ENCOUNTER — Other Ambulatory Visit: Payer: Self-pay | Admitting: Family

## 2018-11-19 ENCOUNTER — Ambulatory Visit: Payer: Medicare Other | Attending: Family | Admitting: Family

## 2018-11-19 ENCOUNTER — Encounter: Payer: Self-pay | Admitting: Family

## 2018-11-19 ENCOUNTER — Ambulatory Visit: Payer: Medicare Other

## 2018-11-19 VITALS — Wt 210.0 lb

## 2018-11-19 DIAGNOSIS — I5023 Acute on chronic systolic (congestive) heart failure: Secondary | ICD-10-CM

## 2018-11-19 DIAGNOSIS — I10 Essential (primary) hypertension: Secondary | ICD-10-CM

## 2018-11-19 NOTE — Progress Notes (Signed)
Virtual Visit via Telephone Note    Evaluation Performed:  Follow-up visit  This visit type was conducted due to national recommendations for restrictions regarding the COVID-19 Pandemic (e.g. social distancing).  This format is felt to be most appropriate for this patient at this time.  All issues noted in this document were discussed and addressed.  No physical exam was performed (except for noted visual exam findings with Video Visits).  Please refer to the patient's chart (MyChart message for video visits and phone note for telephone visits) for the patient's consent to telehealth for [facility]  Date:  11/19/2018   ID:  [name], DOB April 05, 1956, MRN [id number]  Patient Location:  [address] [place] [address]   Provider location:   [facility] [address] [place], [place] [address]  PCP:  [facility]  Cardiologist:  Julien Nordmann, MD Electrophysiologist:  None   Chief Complaint:  Shortness of breath  History of Present Illness:    [name] is a 62 y.o. male who presents via audio/video conferencing for a telehealth visit today.  Patient verified DOB and address.  The patient does not have symptoms concerning for COVID-19 infection (fever, chills, cough, or new SHORTNESS OF BREATH).   Patient reports minimal shortness of breath upon moderate exertion. He describes this as chronic in nature having been present for several years. He has associated fatigue along with this. He denies any dizziness, swelling in his legs/ abdomen, palpitations, chest pain, cough, difficulty sleeping or weight gain. He received  IV lasix yesterday and weight declined 5 pounds overnight. He now weighs 210 but says that his normal/ dry weight is 202-204 pounds.   Prior CV studies:   The following studies were reviewed today:  Echo report from 06/14/18 reviewed and showed an EF of 30-35% along with mild MR.   Past Medical  History:  Diagnosis Date  . CAD in native artery    a. stress echo 12/2007 abnl, EF > 55%, b. LHC 01/28/08: mLAD 30, D1 40, dLCx 70, pRCA 30, mRCA 70, mRCA lesion 2 80, PDA 90, s/p PCI/BMS to prox and distal RCA, s/p PCI/BMS to PDA; c. patient reports PCI/stenting x 2 in early 2017 at the [place] (no records on file) d. 06/2016: cath showing patent stents along RCA and LCx with moderate 40% stenosis along the LAD.   [name] Carotid arterial disease (HCC)    a. 05/2018 CTA Head/neck: 80-90% stenosis @ L carotid bifurcation, extending into the prox LICA. 60% stenosis @ R carotid bifurcation. 50-75% bilateral distal cavernous carotid dzs.  . Chronic combined systolic (congestive) and diastolic (congestive) heart failure (HCC)    a. 2009 Echo: > 55%; b. 06/2016: EF 25-30%; c. 12/2017 Echo: EF 30-35%, diff HK; d. 05/2018 Echo: 30-35%, mild conc LVH, diff HK. Mild MR. Mildly dil LA/RA.  [name] Chronic kidney disease   . COPD (chronic obstructive pulmonary disease) (HCC)   . Diabetes mellitus without complication (HCC)   . Gout   . Hyperlipidemia   . Hypertension   . Hypertensive heart disease   . Ischemic cardiomyopathy    a. 05/2018: 30-35%.  . Obesity   . Persistent atrial fibrillation 01/2017   a. cardioverted 9/18 to NSR; b. 12/2017 noted to be back in Afib-outpt dccv rec; c. CHA2DS2VASc = 6-->Xarelto.  . Stroke (cerebrum) (HCC)    a. 05/2018 MRI: small patchy acute to subacute cortial and white matter infarct in the bilat  cerebrum. Mild chronic small vessel ischmia; b. 05/2018 Carotid U/S: L-carotid bifurcation 80-90% stenosed, R- carotid bifurcation 60% stenosed.  . Tobacco abuse    Past Surgical History:  Procedure Laterality Date  . CARDIAC CATHETERIZATION  2009   [facility];   . CARDIAC CATHETERIZATION  2010  . CARDIAC CATHETERIZATION N/A 07/28/2016   Procedure: Right and Left Heart Cath and possible PCI;  Surgeon: Iran OuchMuhammad A Arida, MD;  Location: [facility] INVASIVE CV LAB;  Service: Cardiovascular;  Laterality:  N/A;  . CARDIOVERSION N/A 03/27/2017   Procedure: CARDIOVERSION;  Surgeon: Iran OuchArida, Muhammad A, MD;  Location: [facility] ORS;  Service: Cardiovascular;  Laterality: N/A;  . CARDIOVERSION N/A 07/30/2018   Procedure: CARDIOVERSION;  Surgeon: Antonieta IbaGollan, Timothy J, MD;  Location: [facility] ORS;  Service: Cardiovascular;  Laterality: N/A;  . CORONARY ANGIOPLASTY  2009   [facility].     Current Meds  Medication Sig  . albuterol-ipratropium (COMBIVENT) 18-103 MCG/ACT inhaler Inhale 2 puffs into the lungs every 4 (four) hours as needed for wheezing.  [name]. amiodarone (PACERONE) 200 MG tablet Take 200 mg by mouth daily.  [name]. atorvastatin (LIPITOR) 80 MG tablet Take 80 mg by mouth daily.  . budesonide-formoterol (SYMBICORT) 160-4.5 MCG/ACT inhaler Inhale 2 puffs into the lungs 2 (two) times daily.  . carvedilol (COREG) 6.25 MG tablet Take 1 tablet (6.25 mg total) by mouth 2 (two) times daily with a meal.  . docusate sodium (COLACE) 100 MG capsule Take 1 capsule (100 mg total) by mouth 2 (two) times daily.  [name]. HYDROcodone-acetaminophen (NORCO/VICODIN) 5-325 MG tablet hydrocodone 5 mg-acetaminophen 325 mg tablet  Take 1 tablet every 4 hours by oral route.  . isosorbide-hydrALAZINE (BIDIL) 20-37.5 MG tablet Take 1.5 tablets by mouth 2 (two) times daily.  . magnesium oxide (MAG-OX) 400 MG tablet Take 400 mg by mouth 2 (two) times daily.  . nitroGLYCERIN (NITROSTAT) 0.4 MG SL tablet Place 0.4 mg under the tongue every 5 (five) minutes as needed for chest pain.  . potassium chloride SA (K-DUR,KLOR-CON) 20 MEQ tablet Take 20 mEq by mouth daily.  . rivaroxaban (XARELTO) 15 MG TABS tablet Take 1 tablet (15 mg total) by mouth daily with supper.  [name]. spironolactone (ALDACTONE) 25 MG tablet Take 1 tablet (25 mg total) by mouth daily.  [name]. torsemide (DEMADEX) 20 MG tablet Take 1 tablet (20 mg total) by mouth 2 (two) times daily.  . traZODone (DESYREL) 50 MG tablet Take 50 mg by mouth at bedtime.     Allergies:   Aspirin;  Ibuprofen; and Tylenol [acetaminophen]   Social History   Tobacco Use  . Smoking status: Former Smoker    Packs/day: 1.00    Years: 35.00    Pack years: 35.00    Types: Cigarettes  . Smokeless tobacco: Never Used  Substance Use Topics  . Alcohol use: Not Currently    Comment: Quit 8 yrs.  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 Drug use: No     Family Hx: The patient's family history includes Cancer in his brother; Heart attack (age of onset: 4255) in his mother; Hypertension in his mother.  ROS:   Please see the history of present illness.     All other systems reviewed and are negative.   Labs/Other Tests and Data Reviewed:    Recent Labs: 07/31/2018: Magnesium 2.2 08/10/2018: TSH 5.829 08/18/2018: Hemoglobin 16.0; Platelets 157 08/19/2018: ALT 30 11/18/2018: B Natriuretic Peptide 1,314.0; BUN 25; Creatinine, Ser 1.62; Potassium 3.9; Sodium 142   Recent Lipid  Panel Lab Results  Component Value Date/Time   CHOL 139 06/14/2018 05:17 AM   CHOL 191 12/09/2012 04:39 AM   TRIG 135 06/14/2018 05:17 AM   TRIG 78 12/09/2012 04:39 AM   HDL 33 (L) 06/14/2018 05:17 AM   HDL 54 12/09/2012 04:39 AM   CHOLHDL 4.2 06/14/2018 05:17 AM   LDLCALC 79 06/14/2018 05:17 AM   LDLCALC 121 (H) 12/09/2012 04:39 AM    Wt Readings from Last 3 Encounters:  11/19/18 210 lb (95.3 kg)  10/13/18 214 lb (97.1 kg)  10/12/18 214 lb 2 oz (97.1 kg)     Exam:    Vital Signs:  Wt 210 lb (95.3 kg) Comment: self-reported  BMI 29.29 kg/m    Well nourished, well developed male in no  acute distress.   ASSESSMENT & PLAN:    1. Acute on chronic heart failure with reduced ejection fraction- - NYHA class II - still fluid overloaded based on weight and patient's description of symptoms although patient says that he does feel better today than he did yesterday - weighing daily and says that his weight dropped 5 pounds overnight after receiving IV lasix yesterday but that his dry weight is 202-204 pounds - will call  same day surgery and have patient get 80mg  IV lasix later today - not adding salt but he has been eating "a lot" of butter lately and he now realizes that butter has quite a bit of sodium in it. He's also not been rinsing his canned foods consistently.  - reviewed the importance of rinsing his canned foods and to get unsalted butter to use - had telemedicine visit with cardiology Mariah Milling) 10/19/2018 - patient was outside and did not have his medications with him to review; "nothing has changed" - BNP 11/18/2018 elevated at 1314.0  2: HTN-  - not checking his BP at home - goes to the [place] for his primary care - BMP 11/18/2018 reviewed and showed sodium 142, potassium 3.9, creatinine 1.62 and GFR 52  COVID-19 Education: The signs and symptoms of COVID-19 were discussed with the patient and how to seek care for testing (follow up with PCP or arrange E-visit).  The importance of social distancing was discussed today.  Patient Risk:   After full review of this patients clinical status, I feel that they are at least moderate risk at this time.  Time:   Today, I have spent 12 minutes with the patient with telehealth technology discussing diet, weight and symptoms to report.     Medication Adjustments/Labs and Tests Ordered: Current medicines are reviewed at length with the patient today.  Concerns regarding medicines are outlined above.   Tests Ordered: No orders of the defined types were placed in this encounter.  Medication Changes: No orders of the defined types were placed in this encounter.   Disposition: Follow-up in 1 week or sooner for any questions/problems before then.   Signed, Delma Freeze, FNP  11/19/2018 11:07 AM    [facility]

## 2018-11-19 NOTE — Patient Instructions (Signed)
Continue weighing daily and call for an overnight weight gain of > 2 pounds or a weekly weight gain of >5 pounds. 

## 2018-11-24 ENCOUNTER — Telehealth: Payer: Self-pay | Admitting: Family

## 2018-11-24 NOTE — Telephone Encounter (Signed)
Patient did not return message left regarding telemedicine visit with the HF Clinic on 11/24/2018. Will attempt to reschedule.

## 2018-11-26 ENCOUNTER — Telehealth: Payer: Self-pay | Admitting: Family

## 2018-12-07 ENCOUNTER — Telehealth: Payer: Self-pay | Admitting: Family

## 2018-12-07 NOTE — Telephone Encounter (Signed)
Patient did not return call regarding telemedicine visit with the HF clinic on 12/07/2018. Will attempt to reschedule.

## 2019-01-22 ENCOUNTER — Inpatient Hospital Stay
Admission: EM | Admit: 2019-01-22 | Discharge: 2019-01-26 | DRG: 291 | Disposition: A | Discharge status: Home Health | Payer: No Typology Code available for payment source | Attending: Internal Medicine | Admitting: Internal Medicine

## 2019-01-22 ENCOUNTER — Other Ambulatory Visit: Payer: Self-pay

## 2019-01-22 ENCOUNTER — Emergency Department: Payer: No Typology Code available for payment source

## 2019-01-22 DIAGNOSIS — I5043 Acute on chronic combined systolic (congestive) and diastolic (congestive) heart failure: Secondary | ICD-10-CM | POA: Diagnosis present

## 2019-01-22 DIAGNOSIS — Z87891 Personal history of nicotine dependence: Secondary | ICD-10-CM

## 2019-01-22 DIAGNOSIS — N183 Chronic kidney disease, stage 3 (moderate): Secondary | ICD-10-CM | POA: Diagnosis present

## 2019-01-22 DIAGNOSIS — E669 Obesity, unspecified: Secondary | ICD-10-CM | POA: Diagnosis present

## 2019-01-22 DIAGNOSIS — Z6830 Body mass index (BMI) 30.0-30.9, adult: Secondary | ICD-10-CM

## 2019-01-22 DIAGNOSIS — Z955 Presence of coronary angioplasty implant and graft: Secondary | ICD-10-CM

## 2019-01-22 DIAGNOSIS — I4892 Unspecified atrial flutter: Secondary | ICD-10-CM | POA: Diagnosis present

## 2019-01-22 DIAGNOSIS — R042 Hemoptysis: Secondary | ICD-10-CM | POA: Diagnosis not present

## 2019-01-22 DIAGNOSIS — I959 Hypotension, unspecified: Secondary | ICD-10-CM | POA: Diagnosis not present

## 2019-01-22 DIAGNOSIS — I509 Heart failure, unspecified: Secondary | ICD-10-CM

## 2019-01-22 DIAGNOSIS — E785 Hyperlipidemia, unspecified: Secondary | ICD-10-CM | POA: Diagnosis present

## 2019-01-22 DIAGNOSIS — Z20828 Contact with and (suspected) exposure to other viral communicable diseases: Secondary | ICD-10-CM | POA: Diagnosis present

## 2019-01-22 DIAGNOSIS — I130 Hypertensive heart and chronic kidney disease with heart failure and stage 1 through stage 4 chronic kidney disease, or unspecified chronic kidney disease: Secondary | ICD-10-CM | POA: Diagnosis not present

## 2019-01-22 DIAGNOSIS — R0602 Shortness of breath: Secondary | ICD-10-CM

## 2019-01-22 DIAGNOSIS — E872 Acidosis: Secondary | ICD-10-CM | POA: Diagnosis present

## 2019-01-22 DIAGNOSIS — J209 Acute bronchitis, unspecified: Secondary | ICD-10-CM | POA: Diagnosis present

## 2019-01-22 DIAGNOSIS — I5023 Acute on chronic systolic (congestive) heart failure: Secondary | ICD-10-CM | POA: Diagnosis present

## 2019-01-22 DIAGNOSIS — R188 Other ascites: Secondary | ICD-10-CM

## 2019-01-22 DIAGNOSIS — Z79891 Long term (current) use of opiate analgesic: Secondary | ICD-10-CM

## 2019-01-22 DIAGNOSIS — D631 Anemia in chronic kidney disease: Secondary | ICD-10-CM | POA: Diagnosis present

## 2019-01-22 DIAGNOSIS — Z7901 Long term (current) use of anticoagulants: Secondary | ICD-10-CM

## 2019-01-22 DIAGNOSIS — Z8673 Personal history of transient ischemic attack (TIA), and cerebral infarction without residual deficits: Secondary | ICD-10-CM

## 2019-01-22 DIAGNOSIS — G4733 Obstructive sleep apnea (adult) (pediatric): Secondary | ICD-10-CM | POA: Diagnosis present

## 2019-01-22 DIAGNOSIS — Z79899 Other long term (current) drug therapy: Secondary | ICD-10-CM

## 2019-01-22 DIAGNOSIS — J440 Chronic obstructive pulmonary disease with acute lower respiratory infection: Secondary | ICD-10-CM | POA: Diagnosis present

## 2019-01-22 DIAGNOSIS — I480 Paroxysmal atrial fibrillation: Secondary | ICD-10-CM

## 2019-01-22 DIAGNOSIS — I248 Other forms of acute ischemic heart disease: Secondary | ICD-10-CM | POA: Diagnosis present

## 2019-01-22 DIAGNOSIS — E1165 Type 2 diabetes mellitus with hyperglycemia: Secondary | ICD-10-CM | POA: Diagnosis present

## 2019-01-22 DIAGNOSIS — I2510 Atherosclerotic heart disease of native coronary artery without angina pectoris: Secondary | ICD-10-CM | POA: Diagnosis present

## 2019-01-22 DIAGNOSIS — I6521 Occlusion and stenosis of right carotid artery: Secondary | ICD-10-CM | POA: Diagnosis present

## 2019-01-22 DIAGNOSIS — E1122 Type 2 diabetes mellitus with diabetic chronic kidney disease: Secondary | ICD-10-CM | POA: Diagnosis present

## 2019-01-22 DIAGNOSIS — R7989 Other specified abnormal findings of blood chemistry: Secondary | ICD-10-CM

## 2019-01-22 DIAGNOSIS — I420 Dilated cardiomyopathy: Secondary | ICD-10-CM

## 2019-01-22 DIAGNOSIS — Z8249 Family history of ischemic heart disease and other diseases of the circulatory system: Secondary | ICD-10-CM

## 2019-01-22 DIAGNOSIS — R778 Other specified abnormalities of plasma proteins: Secondary | ICD-10-CM

## 2019-01-22 DIAGNOSIS — Z886 Allergy status to analgesic agent status: Secondary | ICD-10-CM

## 2019-01-22 DIAGNOSIS — I4819 Other persistent atrial fibrillation: Secondary | ICD-10-CM | POA: Diagnosis present

## 2019-01-22 DIAGNOSIS — I255 Ischemic cardiomyopathy: Secondary | ICD-10-CM | POA: Diagnosis present

## 2019-01-22 LAB — COMPREHENSIVE METABOLIC PANEL
ALT: 33 U/L (ref 0–44)
AST: 43 U/L — ABNORMAL HIGH (ref 15–41)
Albumin: 3.9 g/dL (ref 3.5–5.0)
Alkaline Phosphatase: 292 U/L — ABNORMAL HIGH (ref 38–126)
Anion gap: 12 (ref 5–15)
BUN: 25 mg/dL — ABNORMAL HIGH (ref 8–23)
CO2: 25 mmol/L (ref 22–32)
Calcium: 9.5 mg/dL (ref 8.9–10.3)
Chloride: 105 mmol/L (ref 98–111)
Creatinine, Ser: 1.64 mg/dL — ABNORMAL HIGH (ref 0.61–1.24)
GFR calc Af Amer: 51 mL/min — ABNORMAL LOW (ref >60)
GFR calc non Af Amer: 44 mL/min — ABNORMAL LOW (ref >60)
Glucose, Bld: 133 mg/dL — ABNORMAL HIGH (ref 70–99)
Potassium: 4 mmol/L (ref 3.5–5.1)
Sodium: 142 mmol/L (ref 135–145)
Total Bilirubin: 2.1 mg/dL — ABNORMAL HIGH (ref 0.3–1.2)
Total Protein: 8.6 g/dL — ABNORMAL HIGH (ref 6.5–8.1)

## 2019-01-22 LAB — CBC WITH DIFFERENTIAL/PLATELET
Abs Immature Granulocytes: 0.02 10*3/uL (ref 0.00–0.07)
Basophils Absolute: 0.1 10*3/uL (ref 0.0–0.1)
Basophils Relative: 1 %
Eosinophils Absolute: 0.1 10*3/uL (ref 0.0–0.5)
Eosinophils Relative: 1 %
HCT: 47 % (ref 39.0–52.0)
Hemoglobin: 14.5 g/dL (ref 13.0–17.0)
Immature Granulocytes: 0 %
Lymphocytes Relative: 15 %
Lymphs Abs: 1.3 10*3/uL (ref 0.7–4.0)
MCH: 30 pg (ref 26.0–34.0)
MCHC: 30.9 g/dL (ref 30.0–36.0)
MCV: 97.3 fL (ref 80.0–100.0)
Monocytes Absolute: 0.7 10*3/uL (ref 0.1–1.0)
Monocytes Relative: 9 %
Neutro Abs: 6.4 10*3/uL (ref 1.7–7.7)
Neutrophils Relative %: 74 %
Platelets: 173 10*3/uL (ref 150–400)
RBC: 4.83 MIL/uL (ref 4.22–5.81)
RDW: 17.1 % — ABNORMAL HIGH (ref 11.5–15.5)
WBC: 8.6 10*3/uL (ref 4.0–10.5)
nRBC: 0 % (ref 0.0–0.2)

## 2019-01-22 LAB — BRAIN NATRIURETIC PEPTIDE: B Natriuretic Peptide: 2247 pg/mL — ABNORMAL HIGH (ref 0.0–100.0)

## 2019-01-22 LAB — PROCALCITONIN: Procalcitonin: 0.23 ng/mL

## 2019-01-22 LAB — TROPONIN I (HIGH SENSITIVITY)
Troponin I (High Sensitivity): 126 ng/L (ref <18)
Troponin I (High Sensitivity): 122 ng/L (ref <18)

## 2019-01-22 LAB — SARS CORONAVIRUS 2 BY RT PCR (HOSPITAL ORDER, PERFORMED IN ~~LOC~~ HOSPITAL LAB): SARS Coronavirus 2: NEGATIVE

## 2019-01-22 LAB — TSH: TSH: 1.003 u[IU]/mL (ref 0.350–4.500)

## 2019-01-22 MED ORDER — METHOCARBAMOL 500 MG PO TABS
500.0000 mg | ORAL_TABLET | Freq: Four times a day (QID) | ORAL | Status: DC
  Administered 2019-01-22 – 2019-01-26 (×16): 500 mg via ORAL
  Filled 2019-01-22 (×20): qty 1

## 2019-01-22 MED ORDER — SPIRONOLACTONE 25 MG PO TABS
25.0000 mg | ORAL_TABLET | Freq: Every day | ORAL | Status: DC
  Administered 2019-01-22 – 2019-01-25 (×4): 25 mg via ORAL
  Filled 2019-01-22 (×4): qty 1

## 2019-01-22 MED ORDER — AMIODARONE HCL 200 MG PO TABS
200.0000 mg | ORAL_TABLET | Freq: Every day | ORAL | Status: DC
  Administered 2019-01-22 – 2019-01-26 (×5): 200 mg via ORAL
  Filled 2019-01-22 (×5): qty 1

## 2019-01-22 MED ORDER — GUAIFENESIN-DM 100-10 MG/5ML PO SYRP
15.0000 mL | ORAL_SOLUTION | ORAL | Status: DC | PRN
  Administered 2019-01-22: 15 mL via ORAL
  Filled 2019-01-22: qty 15

## 2019-01-22 MED ORDER — BUDESONIDE 0.25 MG/2ML IN SUSP
0.2500 mg | Freq: Two times a day (BID) | RESPIRATORY_TRACT | Status: DC
  Administered 2019-01-22 – 2019-01-26 (×8): 0.25 mg via RESPIRATORY_TRACT
  Filled 2019-01-22 (×9): qty 2

## 2019-01-22 MED ORDER — DOCUSATE SODIUM 100 MG PO CAPS
100.0000 mg | ORAL_CAPSULE | Freq: Two times a day (BID) | ORAL | Status: DC
  Administered 2019-01-22 – 2019-01-26 (×8): 100 mg via ORAL
  Filled 2019-01-22 (×9): qty 1

## 2019-01-22 MED ORDER — BUMETANIDE 0.25 MG/ML IJ SOLN
1.0000 mg | Freq: Once | INTRAMUSCULAR | Status: AC
  Administered 2019-01-22: 02:00:00 1 mg via INTRAVENOUS
  Filled 2019-01-22: qty 4

## 2019-01-22 MED ORDER — TRAZODONE HCL 50 MG PO TABS
50.0000 mg | ORAL_TABLET | Freq: Every day | ORAL | Status: DC
  Administered 2019-01-22 – 2019-01-25 (×4): 50 mg via ORAL
  Filled 2019-01-22 (×4): qty 1

## 2019-01-22 MED ORDER — RIVAROXABAN 15 MG PO TABS
15.0000 mg | ORAL_TABLET | Freq: Every day | ORAL | Status: DC
  Filled 2019-01-22: qty 1

## 2019-01-22 MED ORDER — MOMETASONE FURO-FORMOTEROL FUM 200-5 MCG/ACT IN AERO
2.0000 | INHALATION_SPRAY | Freq: Two times a day (BID) | RESPIRATORY_TRACT | Status: DC
  Administered 2019-01-22 – 2019-01-26 (×9): 2 via RESPIRATORY_TRACT
  Filled 2019-01-22: qty 8.8

## 2019-01-22 MED ORDER — ONDANSETRON HCL 4 MG PO TABS: 4.0000 mg | ORAL_TABLET | Freq: Four times a day (QID) | ORAL | Status: DC | PRN

## 2019-01-22 MED ORDER — FUROSEMIDE 10 MG/ML IJ SOLN
40.0000 mg | Freq: Two times a day (BID) | INTRAMUSCULAR | Status: DC
  Administered 2019-01-22 – 2019-01-24 (×5): 40 mg via INTRAVENOUS
  Filled 2019-01-22 (×5): qty 4

## 2019-01-22 MED ORDER — ATORVASTATIN CALCIUM 80 MG PO TABS
80.0000 mg | ORAL_TABLET | Freq: Every day | ORAL | Status: DC
  Administered 2019-01-22 – 2019-01-25 (×4): 80 mg via ORAL
  Filled 2019-01-22 (×4): qty 1

## 2019-01-22 MED ORDER — OXYCODONE HCL 5 MG PO TABS
5.0000 mg | ORAL_TABLET | Freq: Two times a day (BID) | ORAL | Status: DC
  Administered 2019-01-22 – 2019-01-26 (×9): 5 mg via ORAL
  Filled 2019-01-22 (×9): qty 1

## 2019-01-22 MED ORDER — IPRATROPIUM-ALBUTEROL 0.5-2.5 (3) MG/3ML IN SOLN
3.0000 mL | Freq: Four times a day (QID) | RESPIRATORY_TRACT | Status: DC
  Administered 2019-01-22 – 2019-01-23 (×3): 3 mL via RESPIRATORY_TRACT
  Filled 2019-01-22 (×3): qty 3

## 2019-01-22 MED ORDER — DOXYCYCLINE HYCLATE 100 MG PO TABS
100.0000 mg | ORAL_TABLET | Freq: Two times a day (BID) | ORAL | Status: DC
  Administered 2019-01-22 – 2019-01-26 (×9): 100 mg via ORAL
  Filled 2019-01-22 (×9): qty 1

## 2019-01-22 MED ORDER — ISOSORB DINITRATE-HYDRALAZINE 20-37.5 MG PO TABS
1.0000 | ORAL_TABLET | Freq: Two times a day (BID) | ORAL | Status: DC
  Administered 2019-01-22 – 2019-01-26 (×9): 1 via ORAL
  Filled 2019-01-22 (×12): qty 1

## 2019-01-22 MED ORDER — DOCUSATE SODIUM 100 MG PO CAPS: 100.0000 mg | ORAL_CAPSULE | Freq: Two times a day (BID) | ORAL | Status: DC

## 2019-01-22 MED ORDER — ONDANSETRON HCL 4 MG/2ML IJ SOLN: 4.0000 mg | Freq: Four times a day (QID) | INTRAMUSCULAR | Status: DC | PRN

## 2019-01-22 MED ORDER — RIVAROXABAN 20 MG PO TABS
20.0000 mg | ORAL_TABLET | Freq: Every day | ORAL | Status: DC
  Administered 2019-01-22 – 2019-01-25 (×4): 20 mg via ORAL
  Filled 2019-01-22 (×4): qty 1

## 2019-01-22 MED ORDER — CARVEDILOL 6.25 MG PO TABS
6.2500 mg | ORAL_TABLET | Freq: Two times a day (BID) | ORAL | Status: DC
  Administered 2019-01-22 – 2019-01-26 (×8): 6.25 mg via ORAL
  Filled 2019-01-22 (×8): qty 1

## 2019-01-22 NOTE — ED Notes (Signed)
ED TO INPATIENT HANDOFF REPORT  ED Nurse Name and Phone #: Finleigh Cheong [phone]   S Name/Age/Gender James CluckSamuel Harrington 63 y.o. male Room/Bed: ED24A/ED24A  Code Status   Code Status: Prior  Home/SNF/Other Home Patient oriented to: self, place, time and situation Is this baseline? Yes   Triage Complete: Triage complete  Chief Complaint Breathing Diff  Triage Note Pt states several days of shob, abd swelling. History of chf. Pt appears in no acute distress.    Allergies Allergies  Allergen Reactions  . Aspirin Other (See Comments)    Told not to take because of blood thinner  . Ibuprofen Nausea And Vomiting  . Tylenol [Acetaminophen] Nausea And Vomiting    Level of Care/Admitting Diagnosis ED Disposition    ED Disposition Condition Comment   Admit  [facility] [[id number]]  Level of Care: Telemetry [5]  Covid Evaluation: Confirmed COVID Negative  Diagnosis: Acute on chronic systolic CHF (congestive heart failure) [facility](HCC) [[id number]]) [[id number]]  Admitting Physician: Arnaldo NatalIAMOND, MICHAEL S [[id number]]  Attending Physician: Arnaldo NatalIAMOND, MICHAEL S [phone][[id number]]  PT Class (Do Not Modify): Observation [104]  PT Acc Code (Do Not Modify): Observation [10022]       B Medical/Surgery History Past Medical History:  Diagnosis Date  . CAD in native artery    a. stress echo 12/2007 abnl, EF > 55%, b. LHC 01/28/08: mLAD 30, D1 40, dLCx 70, pRCA 30, mRCA 70, mRCA lesion 2 80, PDA 90, s/p PCI/BMS to prox and distal RCA, s/p PCI/BMS to PDA; c. patient [place] [facility] [facility] [place] [facility] [place] ([place]) d. 06/2016: cath showing patent stents along RCA and LCx with moderate 40% stenosis along the LAD.   [name]. Carotid arterial disease (HCC)    a. 05/2018 CTA Head/neck: 80-90% stenosis @ L carotid bifurcation, extending into the prox LICA. 60% stenosis @ R carotid bifurcation. 50-75% bilateral distal cavernous carotid dzs.  . Chronic combined systolic (congestive) and diastolic  (congestive) heart failure (HCC)    a. 2009 Echo: > 55%; b. 06/2016: EF 25-30%; c. 12/2017 Echo: EF 30-35%, diff HK; d. 05/2018 Echo: 30-35%, mild conc LVH, diff HK. Mild MR. Mildly dil LA/RA.  [name]. Chronic kidney disease   . COPD (chronic obstructive pulmonary disease) (HCC)   . Diabetes mellitus without complication (HCC)   . Gout   . Hyperlipidemia   . Hypertension   . Hypertensive heart disease   . Ischemic cardiomyopathy    a. 05/2018: 30-35%.  . Obesity   . Persistent atrial fibrillation 01/2017   a. cardioverted 9/18 to NSR; b. 12/2017 noted to be back in Afib-outpt dccv rec; c. CHA2DS2VASc = 6-->Xarelto.  . Stroke (cerebrum) (HCC)    a. 05/2018 MRI: small patchy acute to subacute cortial and white matter infarct in the bilat cerebrum. Mild chronic small vessel ischmia; b. 05/2018 Carotid U/S: L-carotid bifurcation 80-90% stenosed, R- carotid bifurcation 60% stenosed.  . Tobacco abuse    Past Surgical History:  Procedure Laterality Date  . CARDIAC CATHETERIZATION  2009   [facility];   . CARDIAC CATHETERIZATION  2010  . CARDIAC CATHETERIZATION N/A 07/28/2016   Procedure: Right and Left Heart Cath and possible PCI;  Surgeon: Iran OuchMuhammad A Arida, MD;  Location: [facility] INVASIVE CV LAB;  Service: Cardiovascular;  Laterality: N/A;  . CARDIOVERSION N/A 03/27/2017   Procedure: CARDIOVERSION;  Surgeon: Iran OuchArida, Muhammad A, MD;  Location: [facility] ORS;  Service: Cardiovascular;  Laterality: N/A;  . CARDIOVERSION N/A 07/30/2018   Procedure:  CARDIOVERSION;  Surgeon: Antonieta IbaGollan, Timothy J, MD;  Location: [facility] ORS;  Service: Cardiovascular;  Laterality: N/A;  . CORONARY ANGIOPLASTY  2009   [facility].     A IV Location/Drains/Wounds Patient Lines/Drains/Airways Status   Active Line/Drains/Airways    Name:   Placement date:   Placement time:   Site:   Days:   Peripheral IV 01/22/19 Left Hand   01/22/19    0116    Hand   less than 1          Intake/Output Last 24 hours  Intake/Output Summary  (Last 24 hours) at 01/22/2019 0259 Last data filed at 01/22/2019 0250 Gross per 24 hour  Intake -  Output 200 ml  Net -200 ml    Labs/Imaging Results for orders placed or performed during the hospital encounter of 01/22/19 (from the past 48 hour(s))  CBC with Differential     Status: Abnormal   Collection Time: 01/22/19  1:18 AM  Result Value Ref Range   WBC 8.6 4.0 - 10.5 K/uL   RBC 4.83 4.22 - 5.81 MIL/uL   Hemoglobin 14.5 13.0 - 17.0 g/dL   HCT 16.147.0 09.639.0 - 04.552.0 %   MCV 97.3 80.0 - 100.0 fL   MCH 30.0 26.0 - 34.0 pg   MCHC 30.9 30.0 - 36.0 g/dL   RDW 40.917.1 (H) 81.111.5 - 91.415.5 %   Platelets 173 150 - 400 K/uL   nRBC 0.0 0.0 - 0.2 %   Neutrophils Relative % 74 %   Neutro Abs 6.4 1.7 - 7.7 K/uL   Lymphocytes Relative 15 %   Lymphs Abs 1.3 0.7 - 4.0 K/uL   Monocytes Relative 9 %   Monocytes Absolute 0.7 0.1 - 1.0 K/uL   Eosinophils Relative 1 %   Eosinophils Absolute 0.1 0.0 - 0.5 K/uL   Basophils Relative 1 %   Basophils Absolute 0.1 0.0 - 0.1 K/uL   Immature Granulocytes 0 %   Abs Immature Granulocytes 0.02 0.00 - 0.07 K/uL    Comment: Performed at [facility], [address] [place], [place] [address]  Comprehensive metabolic panel     Status: Abnormal   Collection Time: 01/22/19  1:18 AM  Result Value Ref Range   Sodium 142 135 - 145 mmol/L   Potassium 4.0 3.5 - 5.1 mmol/L   Chloride 105 98 - 111 mmol/L   CO2 25 22 - 32 mmol/L   Glucose, Bld 133 (H) 70 - 99 mg/dL   BUN 25 (H) 8 - 23 mg/dL   Creatinine, Ser 6.211.64 (H) 0.61 - 1.24 mg/dL   Calcium 9.5 8.9 - 30.810.3 mg/dL   Total Protein 8.6 (H) 6.5 - 8.1 g/dL   Albumin 3.9 3.5 - 5.0 g/dL   AST 43 (H) 15 - 41 U/L   ALT 33 0 - 44 U/L   Alkaline Phosphatase 292 (H) 38 - 126 U/L   Total Bilirubin 2.1 (H) 0.3 - 1.2 mg/dL   GFR calc non Af Amer 44 (L) >60 mL/min   GFR calc Af Amer 51 (L) >60 mL/min   Anion gap 12 5 - 15    Comment: Performed at [facility], [address] [place], [place] [address]  Brain  natriuretic peptide     Status: Abnormal   Collection Time: 01/22/19  1:18 AM  Result Value Ref Range   B Natriuretic Peptide 2,247.0 (H) 0.0 - 100.0 pg/mL    Comment: Performed at [facility], [address] [place], [place] [address]  Troponin I (High Sensitivity)     Status: Abnormal   Collection Time: 01/22/19  1:18 AM  Result Value Ref Range   Troponin I (High Sensitivity) 126 (HH) <18 ng/L    Comment: CRITICAL RESULT CALLED TO, READ BACK BY AND VERIFIED WITH Cloee Dunwoody ON 01/22/19 AT 0201 SRC (NOTE) Elevated high sensitivity troponin I (hsTnI) values and significant  changes across serial measurements may suggest ACS but many other  chronic and acute conditions are known to elevate hsTnI results.  Refer to the "Links" section for chest pain algorithms and additional  guidance. Performed at [facility], [address] [place], [place] [address]   SARS Coronavirus 2 (CEPHEID - Performed in [facility] hospital lab), Hosp Order     Status: None   Collection Time: 01/22/19  1:41 AM   Specimen: Nasopharyngeal Swab  Result Value Ref Range   SARS Coronavirus 2 NEGATIVE NEGATIVE    Comment: (NOTE) If result is NEGATIVE SARS-CoV-2 target nucleic acids are NOT DETECTED. The SARS-CoV-2 RNA is generally detectable in upper and lower  respiratory specimens during the acute phase of infection. The lowest  concentration of SARS-CoV-2 viral copies this assay can detect is 250  copies / mL. A negative result does not preclude SARS-CoV-2 infection  and should not be used as the sole basis for treatment or other  patient management decisions.  A negative result may occur with  improper specimen collection / handling, submission of specimen other  than nasopharyngeal swab, presence of viral mutation(s) within the  areas targeted by this assay, and inadequate number of viral copies  (<250 copies / mL). A negative result must be combined with clinical  observations,  patient history, and epidemiological information. If result is POSITIVE SARS-CoV-2 target nucleic acids are DETECTED. The SARS-CoV-2 RNA is generally detectable in upper and lower  respiratory specimens dur ing the acute phase of infection.  Positive  results are indicative of active infection with SARS-CoV-2.  Clinical  correlation with patient history and other diagnostic information is  necessary to determine patient infection status.  Positive results do  not rule out bacterial infection or co-infection with other viruses. If result is PRESUMPTIVE POSTIVE SARS-CoV-2 nucleic acids MAY BE PRESENT.   A presumptive positive result was obtained on the submitted specimen  and confirmed on repeat testing.  While 2019 novel coronavirus  (SARS-CoV-2) nucleic acids may be present in the submitted sample  additional confirmatory testing may be necessary for epidemiological  and / or clinical management purposes  to differentiate between  SARS-CoV-2 and other Sarbecovirus currently known to infect humans.  If clinically indicated additional testing with an alternate test  methodology [phone]([id number]) is advised. The SARS-CoV-2 RNA is generally  detectable in upper and lower respiratory sp ecimens during the acute  phase of infection. The expected result is Negative. Fact Sheet for Patients:  BoilerBrush.com.cyhttps://www.fda.gov/media/136312/download Fact Sheet for Healthcare Providers: https://pope.com/https://www.fda.gov/media/136313/download This test is not yet approved or cleared by the [place] FDA and has been authorized for detection and/or diagnosis of SARS-CoV-2 by FDA under an Emergency Use Authorization (EUA).  This EUA will remain in effect (meaning this test can be used) for the duration of the COVID-19 declaration under Section 564(b)(1) of the Act, 21 U.S.C. section 360bbb-3(b)(1), unless the authorization is terminated or revoked sooner. Performed at [facility], [address]  [place], [place] [address]    Dg Chest Port 1 View  Result Date: 01/22/2019  CLINICAL DATA:  Shortness of breath EXAM: PORTABLE CHEST 1 VIEW COMPARISON:  August 18, 2018 FINDINGS: The heart size is enlarged. There are prominent interstitial lung markings bilaterally with scattered ground-glass airspace opacities. There is no pneumothorax. No large pleural effusion. There is no acute osseous abnormality. IMPRESSION: Cardiomegaly with scattered airspace opacities bilaterally and prominent interstitial lung markings. These findings are favored to represent pulmonary edema, however an atypical infectious process can have a similar appearance and should be excluded. Electronically Signed   By: Constance Holster M.D.   On: 01/22/2019 01:33    Pending Labs Unresulted Labs (From admission, onward)    Start     Ordered   Signed and Held  TSH  Add-on,   R     Signed and Held          Vitals/Pain Today's Vitals   01/22/19 0200 01/22/19 0215 01/22/19 0230 01/22/19 0245  BP: (!) 144/107  (!) 164/94   Pulse: (!) 104 (!) 102 99 (!) 106  Resp: (!) 27 20 15 20   Temp:      TempSrc:      SpO2: 94% 92% 95% 95%  Weight:      Height:      PainSc:        Isolation Precautions No active isolations  Medications Medications  bumetanide (BUMEX) injection 1 mg (1 mg Intravenous Given 01/22/19 0214)    Mobility walks Low fall risk   Focused Assessments Cardiac Assessment Handoff:    Lab Results  Component Value Date   CKTOTAL 126 12/11/2012   CKMB 3.7 (H) 12/11/2012   TROPONINI 0.37 ([place]) 07/26/2018   No results found for: DDIMER Does the Patient currently have chest pain? No     R Recommendations: See Admitting Provider Note  Report given to:   Additional Notes:

## 2019-01-22 NOTE — Consult Note (Signed)
Cardiology Consult    Patient ID: [name] MRN: [id number], DOB/AGE: Oct 30, 1955   Admit date: 01/22/2019 Date of Consult: 01/22/2019  Primary Physician: [facility] Primary [facility]ologist[facility] Requesting Provider: Dr. Joycelyn RuaMichael diamond Reason for consult: Shortness of breath, abdominal swelling, CHF  Patient Profile    [name] is a 63 y.o. male with a history of CAD s/p prior stenting to the RCA and RPDA, HFrEF w/ EF of 30-35% by echo 05/2018, HTN, HL, DMII, persistent afib/flutter, and carotid arterial dzs s/p recent stroke in 05/2018 - pending R CEA, who is being seen today for the evaluation of shortness of breath, abdominal swelling, chest tightness  Past Medical History   Past Medical History:  Diagnosis Date  . CAD in native artery    a. stress echo 12/2007 abnl, EF > 55%, b. LHC 01/28/08: mLAD 30, D1 40, dLCx 70, pRCA 30, mRCA 70, mRCA lesion 2 80, PDA 90, s/p PCI/BMS to prox and distal RCA, s/p PCI/BMS to PDA; c. patient reports PCI/stenting x 2 in early 2017 at the [facility] (no records on file) d. 06/2016: cath showing patent stents along RCA and LCx with moderate 40% stenosis along the LAD.   [name]. Carotid arterial disease (HCC)    a. 05/2018 CTA Head/neck: 80-90% stenosis @ L carotid bifurcation, extending into the prox LICA. 60% stenosis @ R carotid bifurcation. 50-75% bilateral distal cavernous carotid dzs.  . Chronic combined systolic (congestive) and diastolic (congestive) heart failure (HCC)    a. 2009 Echo: > 55%; b. 06/2016: EF 25-30%; c. 12/2017 Echo: EF 30-35%, diff HK; d. 05/2018 Echo: 30-35%, mild conc LVH, diff HK. Mild MR. Mildly dil LA/RA.  [name]. Chronic kidney disease   . COPD (chronic obstructive pulmonary disease) (HCC)   . Diabetes mellitus without complication (HCC)   . Gout   . Hyperlipidemia   . Hypertension   . Hypertensive heart disease   . Ischemic cardiomyopathy    a. 05/2018: 30-35%.  . Obesity   . Persistent atrial fibrillation  01/2017   a. cardioverted 9/18 to NSR; b. 12/2017 noted to be back in Afib-outpt dccv rec; c. CHA2DS2VASc = 6-->Xarelto.  . Stroke (cerebrum) (HCC)    a. 05/2018 MRI: small patchy acute to subacute cortial and white matter infarct in the bilat cerebrum. Mild chronic small vessel ischmia; b. 05/2018 Carotid U/S: L-carotid bifurcation 80-90% stenosed, R- carotid bifurcation 60% stenosed.  . Tobacco abuse     Past Surgical History:  Procedure Laterality Date  . CARDIAC CATHETERIZATION  2009   [facility];   . CARDIAC CATHETERIZATION  2010  . CARDIAC CATHETERIZATION N/A 07/28/2016   Procedure: Right and Left Heart Cath and possible PCI;  Surgeon: Iran OuchMuhammad A Arida, MD;  Location: [facility] INVASIVE CV LAB;  Service: Cardiovascular;  Laterality: N/A;  . CARDIOVERSION N/A 03/27/2017   Procedure: CARDIOVERSION;  Surgeon: Iran OuchArida, Muhammad A, MD;  Location: [facility] ORS;  Service: Cardiovascular;  Laterality: N/A;  . CARDIOVERSION N/A 07/30/2018   Procedure: CARDIOVERSION;  Surgeon: Antonieta IbaGollan, Timothy J, MD;  Location: [facility] ORS;  Service: Cardiovascular;  Laterality: N/A;  . CORONARY ANGIOPLASTY  2009   [facility]     Allergies  Allergies  Allergen Reactions  . Aspirin Other (See Comments)    Told not to take because of blood thinner  . Ibuprofen Nausea And Vomiting  . Tylenol [Acetaminophen] Nausea And Vomiting    History of Present Illness    63 y/o ? with PMH including CAD  s/p prior stenting to the RCA and RPDA, HFrEF w/ EF of 30-35% by echo 05/2018, HTN, HL, DMII, persistent afib/flutter, and carotid arterial dzs s/p stroke in 05/2018 ,  [facility].    Recent no-shows to CHF clinic Notes indicating dry weight 202 up to 204 pounds Weight has been trending high 207 up to 215 pounds over the past several months Previously reported compliance with torsemide 20 twice daily.  He does not know if he is taking torsemide, wife gives him his medications He reports  having significant cough over the past 2 weeks, sometimes brown sputum Diet is poor Blood pressure poorly controlled -For fluid or weight gain has been advised in the past to increase torsemide up to 40 twice daily for several days then back down to 20 twice daily  Prior hospital admission in January 2020 for atrial fibrillation/flutter with RVR.  Also with acute renal failure (creat 2.43), leukocytosis (18.3), lactic acidosis (lactate 3.2; PCT 8.4), & w/ elevated trop (0.20).     treated w/ IV amio and IV dilt, as well as loaded w/ digoxin with improvement in rate.   placed on IV abx.     --2008 with abnl stress test @ that time, followed by cath and PCI of the dRCA and RPDA in 2009.   -- repeat stenting in 2017  --hospitalized in 06/2016 with CHF and new finding of LV dysfxn (EF 25-30%).  Cath @ that time showed patent RCA/RPDA and LCX stents and he was medically managed.   --02/2017, Afib/flutter w/ RVR and was successfully cardioverted.   --admitted 12/2017 with recurrent afib/flutter and CHF.  rate controlled and maintained on xarelto w/ a plan for outpt dccv.   --readmitted in 05/2018 with slurred speech and facial droop.  MRI showed bilateral cerebral stroke and CTA of head/neck showed severe R carotid dzs.    Plan for carotid endarterectomy    Inpatient Medications    . docusate sodium  100 mg Oral BID  . mometasone-formoterol  2 puff Inhalation BID  . Rivaroxaban  15 mg Oral Q supper    Family History    Family History  Problem Relation Age of Onset  . Heart attack Mother 2155  . Hypertension Mother   . Cancer Brother    He indicated that his mother is deceased. He indicated that his father is alive. He indicated that his brother is deceased.   Social History    Social History   Socioeconomic History  . Marital status: Married    Spouse name: Not on file  . Number of children: Not on file  . Years of education: Not on file  . Highest education level: Not on file   Occupational History  . Not on file  Social Needs  . Financial resource strain: Not on file  . Food insecurity    Worry: Not on file    Inability: Not on file  . Transportation needs    Medical: Not on file    Non-medical: Not on file  Tobacco Use  . Smoking status: Former Smoker    Packs/day: 1.00    Years: 35.00    Pack years: 35.00    Types: Cigarettes  . Smokeless tobacco: Never Used  Substance and Sexual Activity  . Alcohol use: Not Currently    Comment: Quit 8 yrs.  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 Drug use: No  . Sexual activity: Not on file  Lifestyle  . Physical activity  Days per week: Not on file    Minutes per session: Not on file  . Stress: Not on file  Relationships  . Social Herbalist on phone: Not on file    Gets together: Not on file    Attends religious service: Not on file    Active member of club or organization: Not on file    Attends meetings of clubs or organizations: Not on file    Relationship status: Not on file  . Intimate partner violence    Fear of current or ex partner: Not on file    Emotionally abused: Not on file    Physically abused: Not on file    Forced sexual activity: Not on file  Other Topics Concern  . Not on file  Social History Narrative   Lives locally with wife.  Does not routinely exercise.     Review of Systems    Review of Systems  Constitutional: Negative.   HENT: Negative.   Respiratory: Positive for shortness of breath.   Cardiovascular: Negative.        Abdominal fullness  Gastrointestinal: Negative.   Musculoskeletal: Negative.   Neurological: Negative.   Psychiatric/Behavioral: Negative.   All other systems reviewed and are negative.    Physical Exam    Blood pressure (!) 158/103, pulse (!) 102, temperature 99.8 F (37.7 C), temperature source Oral, resp. rate 17, height 5\' 11"  (1.803 m), weight 96.8 kg, SpO2 98 %.  Constitutional:  oriented to person, place, and time. No distress.  HENT:   Head: Grossly normal Eyes:  no discharge. No scleral icterus.  Neck: No JVD, no carotid bruits  Cardiovascular: Regular rate and rhythm, no murmurs appreciated Pulmonary/Chest: Clear to auscultation bilaterally, mild wheezing, scattered Rales at the bases Abdominal: Soft.  no distension.  no tenderness.  Musculoskeletal: Normal range of motion Neurological:  normal muscle tone. Coordination normal. No atrophy Skin: Skin warm and dry Psychiatric: normal affect, pleasant   Labs    No results for input(s): CKTOTAL, CKMB, TROPONINI in the last 72 hours. Lab Results  Component Value Date   WBC 8.6 01/22/2019   HGB 14.5 01/22/2019   HCT 47.0 01/22/2019   MCV 97.3 01/22/2019   PLT 173 01/22/2019    Recent Labs  Lab 01/22/19 0118  NA 142  K 4.0  CL 105  CO2 25  BUN 25*  CREATININE 1.64*  CALCIUM 9.5  PROT 8.6*  BILITOT 2.1*  ALKPHOS 292*  ALT 33  AST 43*  GLUCOSE 133*   Lab Results  Component Value Date   CHOL 139 06/14/2018   HDL 33 (L) 06/14/2018   LDLCALC 79 06/14/2018   TRIG 135 06/14/2018     Radiology Studies    Echocardiogram December 2019 - Left ventricle: The cavity size was mildly dilated. There was mild concentric hypertrophy. Systolic function was moderately to severely reduced. The estimated ejection fraction was in the range of 30% to 35%. Diffuse hypokinesis. - Mitral valve: There was mild regurgitation. - Left atrium: The atrium was mildly dilated. - Right atrium: The atrium was mildly dilated. - Pulmonary arteries: Systolic pressure could not be accurately estimated. - Inferior vena cava: The vessel was dilated. The respirophasic diameter changes were blunted (< 50%), consistent with elevated central venous pressure.  ECG & Cardiac Imaging    EKG personally reviewed by myself showing normal sinus rhythm rate 101 bpm PVCs unable to exclude old anterior MI, consider old inferior MI  Assessment &  Plan    1.  Acute on chronic diastolic and  systolic CHF Markedly elevated BNP, weight gain, Shortness of breath with abdominal bloating Weight in the chart is 212 pounds Baseline around 202 up to 204 REDS VEST measurement performed by myself at the bedside midmorning Score of 39, repeat score of 40.  Score was placed in the flowsheet --Order placed for Lasix 40 IV twice daily  2.  Afib RVR:   Maintaining normal sinus rhythm On beta-blocker , will restart his  amiodarone  3.  Elevated troponin:  Demand ischemia in the setting of heart failure Non-trending high-sensitivity troponin No plan for ischemic work-up at this time  4.  Essential HTN/Hypotension:   Blood pressure elevated, has been restarted on his outpatient medications  5.  HL:  LDL 79 in Dec.   Continue statin  7.  DMII:  Per IM.  8.  Carotid arterial dzs:  s/p stroke in Dec w/ 80% right carotid stenosis @ the bifurcation.    Cough 2 weeks of cough, with sputum  unable to exclude bronchitis We will monitor for now  REDS VEST measurements made in the room, hospitalist present, discussion concerning his care with hospitalist and with patient  Total encounter time more than 110 minutes  Greater than 50% was spent in counseling and coordination of care with the patient   Signed, Julien Nordmannimothy Gollan, 01/22/2019, 9:56 AM  For questions or updates, please contact   Please consult [email or web address] for contact info under Cardiology/STEMI.

## 2019-01-22 NOTE — H&P (Signed)
James CluckSamuel Harrington is an 63 y.o. male.   Chief Complaint: Shortness of breath HPI: The patient with past medical history of CHF, coronary artery disease status post multiple stent placement, hypertension, hyperlipidemia, atrial fibrillation and stroke presents to the emergency department complaining of shortness of breath.  The patient reports that his dyspnea has been progressive over the last several days to weeks.  He is also developed some chest pain/tightness with mild exertion.  Laboratory evaluation was significant for dramatically elevated BNP as well as elevated troponin.  Chest x-ray showed pulmonary edema.  Patient was then given Bumex prior to the emergency department staff called the hospitalist service for admission.  Past Medical History:  Diagnosis Date  . CAD in native artery    a. stress echo 12/2007 abnl, EF > 55%, b. LHC 01/28/08: mLAD 30, D1 40, dLCx 70, pRCA 30, mRCA 70, mRCA lesion 2 80, PDA 90, s/p PCI/BMS to prox and distal RCA, s/p PCI/BMS to PDA; c. patient reports [facility] 2 in early [facility] (no records on file) d. 06/2016: cath showing patent stents along RCA and LCx with moderate 40% stenosis along the LAD.   [name]. Carotid arterial disease (HCC)    a. 05/2018 CTA Head/neck: 80-90% stenosis @ L carotid bifurcation, extending into the prox LICA. 60% stenosis @ R carotid bifurcation. 50-75% bilateral distal cavernous carotid dzs.  . Chronic combined systolic (congestive) and diastolic (congestive) heart failure (HCC)    a. 2009 Echo: > 55%; b. 06/2016: EF 25-30%; c. 12/2017 Echo: EF 30-35%, diff HK; d. 05/2018 Echo: 30-35%, mild conc LVH, diff HK. Mild MR. Mildly dil LA/RA.  [name]. Chronic kidney disease   . COPD (chronic obstructive pulmonary disease) (HCC)   . Diabetes mellitus without complication (HCC)   . Gout   . Hyperlipidemia   . Hypertension   . Hypertensive heart disease   . Ischemic cardiomyopathy    a. 05/2018: 30-35%.  . Obesity   . Persistent atrial  fibrillation 01/2017   a. cardioverted 9/18 to NSR; b. 12/2017 noted to be back in Afib-outpt dccv rec; c. CHA2DS2VASc = 6-->Xarelto.  . Stroke (cerebrum) (HCC)    a. 05/2018 MRI: small patchy acute to subacute cortial and white matter infarct in the bilat cerebrum. Mild chronic small vessel ischmia; b. 05/2018 Carotid U/S: L-carotid bifurcation 80-90% stenosed, R- carotid bifurcation 60% stenosed.  . Tobacco abuse     Past Surgical History:  Procedure Laterality Date  . CARDIAC CATHETERIZATION  2009   [facility];   . CARDIAC CATHETERIZATION  2010  . CARDIAC CATHETERIZATION N/A 07/28/2016   Procedure: Right and Left Heart Cath and possible PCI;  Surgeon: Iran OuchMuhammad A Arida, MD;  Location: [facility] INVASIVE CV LAB;  Service: Cardiovascular;  Laterality: N/A;  . CARDIOVERSION N/A 03/27/2017   Procedure: CARDIOVERSION;  Surgeon: Iran OuchArida, Muhammad A, MD;  Location: [facility] ORS;  Service: Cardiovascular;  Laterality: N/A;  . CARDIOVERSION N/A 07/30/2018   Procedure: CARDIOVERSION;  Surgeon: Antonieta IbaGollan, Timothy J, MD;  Location: [facility] ORS;  Service: Cardiovascular;  Laterality: N/A;  . CORONARY ANGIOPLASTY  2009   [facility].    Family History  Problem Relation Age of Onset  . Heart attack Mother [id number]  . Hypertension Mother   . Cancer Brother    Social History:  reports that he has quit smoking. His smoking use included cigarettes. He has a 35.00 pack-year smoking history. He has never used smokeless tobacco. He reports previous alcohol use. He reports  that he does not use drugs.  Allergies:  Allergies  Allergen Reactions  . Aspirin Other (See Comments)    Told not to take because of blood thinner  . Ibuprofen Nausea And Vomiting  . Tylenol [Acetaminophen] Nausea And Vomiting    Medications Prior to Admission  Medication Sig Dispense Refill  . albuterol-ipratropium (COMBIVENT) 18-103 MCG/ACT inhaler Inhale 2 puffs into the lungs every 4 (four) hours as needed for wheezing.    [name] amiodarone  (PACERONE) 200 MG tablet Take 200 mg by mouth daily.    [name] atorvastatin (LIPITOR) 80 MG tablet Take 80 mg by mouth daily.    . budesonide-formoterol (SYMBICORT) 160-4.5 MCG/ACT inhaler Inhale 2 puffs into the lungs 2 (two) times daily.    . carvedilol (COREG) 6.25 MG tablet Take 1 tablet (6.25 mg total) by mouth 2 (two) times daily with a meal. 60 tablet 1  . docusate sodium (COLACE) 100 MG capsule Take 1 capsule (100 mg total) by mouth 2 (two) times daily. 60 capsule 6  . HYDROcodone-acetaminophen (NORCO/VICODIN) 5-325 MG tablet hydrocodone 5 mg-acetaminophen 325 mg tablet  Take 1 tablet every 4 hours by oral route.    . isosorbide-hydrALAZINE (BIDIL) 20-37.5 MG tablet Take 1.5 tablets by mouth 2 (two) times daily.    . magnesium oxide (MAG-OX) 400 MG tablet Take 400 mg by mouth 2 (two) times daily.    . nitroGLYCERIN (NITROSTAT) 0.4 MG SL tablet Place 0.4 mg under the tongue every 5 (five) minutes as needed for chest pain.    . potassium chloride SA (K-DUR,KLOR-CON) 20 MEQ tablet Take 20 mEq by mouth daily.    . rivaroxaban (XARELTO) 15 MG TABS tablet Take 1 tablet (15 mg total) by mouth daily with supper. 30 tablet 1  . spironolactone (ALDACTONE) 25 MG tablet Take 1 tablet (25 mg total) by mouth daily. 90 tablet 3  . torsemide (DEMADEX) 20 MG tablet Take 1 tablet (20 mg total) by mouth 2 (two) times daily. 60 tablet 0  . traZODone (DESYREL) 50 MG tablet Take 50 mg by mouth at bedtime.      Results for orders placed or performed during the hospital encounter of 01/22/19 (from the past 48 hour(s))  CBC with Differential     Status: Abnormal   Collection Time: 01/22/19  1:18 AM  Result Value Ref Range   WBC 8.6 4.0 - 10.5 K/uL   RBC 4.83 4.22 - 5.81 MIL/uL   Hemoglobin 14.5 13.0 - 17.0 g/dL   HCT 47.0 39.0 - 52.0 %   MCV 97.3 80.0 - 100.0 fL   MCH 30.0 26.0 - 34.0 pg   MCHC 30.9 30.0 - 36.0 g/dL   RDW 17.1 (H) 11.5 - 15.5 %   Platelets 173 150 - 400 K/uL   nRBC 0.0 0.0 - 0.2 %    Neutrophils Relative % 74 %   Neutro Abs 6.4 1.7 - 7.7 K/uL   Lymphocytes Relative 15 %   Lymphs Abs 1.3 0.7 - 4.0 K/uL   Monocytes Relative 9 %   Monocytes Absolute 0.7 0.1 - 1.0 K/uL   Eosinophils Relative 1 %   Eosinophils Absolute 0.1 0.0 - 0.5 K/uL   Basophils Relative 1 %   Basophils Absolute 0.1 0.0 - 0.1 K/uL   Immature Granulocytes 0 %   Abs Immature Granulocytes 0.02 0.00 - 0.07 K/uL    Comment: Performed at [facility], [address], [place], [place] [address]  Comprehensive metabolic panel  Status: Abnormal   Collection Time: 01/22/19  1:18 AM  Result Value Ref Range   Sodium 142 135 - 145 mmol/L   Potassium 4.0 3.5 - 5.1 mmol/L   Chloride 105 98 - 111 mmol/L   CO2 25 22 - 32 mmol/L   Glucose, Bld 133 (H) 70 - 99 mg/dL   BUN 25 (H) 8 - 23 mg/dL   Creatinine, Ser 1.611.64 (H) 0.61 - 1.24 mg/dL   Calcium 9.5 8.9 - 09.610.3 mg/dL   Total Protein 8.6 (H) 6.5 - 8.1 g/dL   Albumin 3.9 3.5 - 5.0 g/dL   AST 43 (H) 15 - 41 U/L   ALT 33 0 - 44 U/L   Alkaline Phosphatase 292 (H) 38 - 126 U/L   Total Bilirubin 2.1 (H) 0.3 - 1.2 mg/dL   GFR calc non Af Amer 44 (L) >60 mL/min   GFR calc Af Amer 51 (L) >60 mL/min   Anion gap 12 5 - 15    Comment: Performed at [facility], [address] [place], [place] [address]  Brain natriuretic peptide     Status: Abnormal   Collection Time: 01/22/19  1:18 AM  Result Value Ref Range   B Natriuretic Peptide 2,247.0 (H) 0.0 - 100.0 pg/mL    Comment: Performed at [facility], [address] [place], [place] [address]  Troponin I (High Sensitivity)     Status: Abnormal   Collection Time: 01/22/19  1:18 AM  Result Value Ref Range   Troponin I (High Sensitivity) 126 (HH) <18 ng/L    Comment: CRITICAL RESULT CALLED TO, READ BACK BY AND VERIFIED WITH APRIL BRUMGARD ON 01/22/19 AT 0201 SRC (NOTE) Elevated high sensitivity troponin I (hsTnI) values and significant  changes across serial measurements may suggest ACS  but many other  chronic and acute conditions are known to elevate hsTnI results.  Refer to the "Links" section for chest pain algorithms and additional  guidance. Performed at [facility], [address] [place], [place] [address]   SARS Coronavirus 2 (CEPHEID - Performed in [facility] hospital lab), Hosp Order     Status: None   Collection Time: 01/22/19  1:41 AM   Specimen: Nasopharyngeal Swab  Result Value Ref Range   SARS Coronavirus 2 NEGATIVE NEGATIVE    Comment: (NOTE) If result is NEGATIVE SARS-CoV-2 target nucleic acids are NOT DETECTED. The SARS-CoV-2 RNA is generally detectable in upper and lower  respiratory specimens during the acute phase of infection. The lowest  concentration of SARS-CoV-2 viral copies this assay can detect is 250  copies / mL. A negative result does not preclude SARS-CoV-2 infection  and should not be used as the sole basis for treatment or other  patient management decisions.  A negative result may occur with  improper specimen collection / handling, submission of specimen other  than nasopharyngeal swab, presence of viral mutation(s) within the  areas targeted by this assay, and inadequate number of viral copies  (<250 copies / mL). A negative result must be combined with clinical  observations, patient history, and epidemiological information. If result is POSITIVE SARS-CoV-2 target nucleic acids are DETECTED. The SARS-CoV-2 RNA is generally detectable in upper and lower  respiratory specimens dur ing the acute phase of infection.  Positive  results are indicative of active infection with SARS-CoV-2.  Clinical  correlation with patient history and other diagnostic information is  necessary to determine patient infection status.  Positive results do  not rule out  bacterial infection or co-infection with other viruses. If result is PRESUMPTIVE POSTIVE SARS-CoV-2 nucleic acids MAY BE PRESENT.   A presumptive positive result was  obtained on the submitted specimen  and confirmed on repeat testing.  While 2019 novel coronavirus  (SARS-CoV-2) nucleic acids may be present in the submitted sample  additional confirmatory testing may be necessary for epidemiological  and / or clinical management purposes  to differentiate between  SARS-CoV-2 and other Sarbecovirus currently known to infect humans.  If clinically indicated additional testing with an alternate test  methodology [id number]) is advised. The SARS-CoV-2 RNA is generally  detectable in upper and lower respiratory sp ecimens during the acute  phase of infection. The expected result is Negative. Fact Sheet for Patients:  BoilerBrush.com.cyhttps://www.fda.gov/media/136312/download Fact Sheet for Healthcare Providers: https://pope.com/https://www.fda.gov/media/136313/download This test is not yet approved or cleared by the [place] FDA and has been authorized for detection and/or diagnosis of SARS-CoV-2 by FDA under an Emergency Use Authorization (EUA).  This EUA will remain in effect (meaning this test can be used) for the duration of the COVID-19 declaration under Section 564(b)(1) of the Act, 21 U.S.C. section 360bbb-3(b)(1), unless the authorization is terminated or revoked sooner. Performed at [facility], [address] [place], [place] [address]    Dg Chest Port 1 View  Result Date: 01/22/2019 CLINICAL DATA:  Shortness of breath EXAM: PORTABLE CHEST 1 VIEW COMPARISON:  August 18, 2018 FINDINGS: The heart size is enlarged. There are prominent interstitial lung markings bilaterally with scattered ground-glass airspace opacities. There is no pneumothorax. No large pleural effusion. There is no acute osseous abnormality. IMPRESSION: Cardiomegaly with scattered airspace opacities bilaterally and prominent interstitial lung markings. These findings are favored to represent pulmonary edema, however an atypical infectious process can have a similar appearance and should be  excluded. Electronically Signed   By: Katherine Mantlehristopher  Green M.D.   On: 01/22/2019 01:33    Review of Systems  Constitutional: Negative for chills and fever.  HENT: Negative for sore throat and tinnitus.   Eyes: Negative for blurred vision and redness.  Respiratory: Positive for shortness of breath. Negative for cough.   Cardiovascular: Negative for chest pain, palpitations, orthopnea and PND.  Gastrointestinal: Negative for abdominal pain, diarrhea, nausea and vomiting.  Genitourinary: Negative for dysuria, frequency and urgency.  Musculoskeletal: Negative for joint pain and myalgias.  Skin: Negative for rash.       No lesions  Neurological: Negative for speech change, focal weakness and weakness.  Endo/Heme/Allergies: Does not bruise/bleed easily.       No temperature intolerance  Psychiatric/Behavioral: Negative for depression and suicidal ideas.    Blood pressure (!) 164/94, pulse (!) 106, temperature 98.3 F (36.8 C), temperature source Oral, resp. rate 20, height 5\' 11"  (1.803 m), weight 97.5 kg, SpO2 95 %. Physical Exam  Constitutional: He is oriented to person, place, and time. He appears well-developed and well-nourished. No distress.  HENT:  Head: Normocephalic and atraumatic.  Mouth/Throat: Oropharynx is clear and moist.  Eyes: Pupils are equal, round, and reactive to light. Conjunctivae and EOM are normal. No scleral icterus.  Neck: Normal range of motion. Neck supple. No JVD present. No tracheal deviation present. No thyromegaly present.  Cardiovascular: Normal rate, regular rhythm and normal heart sounds. Exam reveals no gallop and no friction rub.  No murmur heard. Respiratory: Effort normal and breath sounds normal. No respiratory distress.  GI: Soft. Bowel sounds are normal. He exhibits no distension. There is no abdominal tenderness.  Genitourinary:  Genitourinary Comments: Deferred   Musculoskeletal: Normal range of motion.        General: No edema.   Lymphadenopathy:    He has no cervical adenopathy.  Neurological: He is alert and oriented to person, place, and time. No cranial nerve deficit.  Skin: Skin is warm and dry. There is erythema.  Psychiatric: He has a normal mood and affect. His behavior is normal. Judgment and thought content normal.     Assessment/Plan This is a 63 year old male admitted for CHF exacerbation. 1.  CHF: Acute on chronic; systolic.  Last EF 30 to 35%.  Continue diuresis.  Consult cardiology. 2.  CAD: Contributing to ischemic cardiomyopathy.  Stable; continue aspirin.  Nitroglycerin as needed.  The patient is chest pain-free 3.  Hypertension: Controlled; continue BiDil, Coreg and Spironolactone.  (Awaiting med reconciliation completion). 4.  Diabetes mellitus type 2: Check hemoglobin A1c.  The patient does not appear to be on any diabetic medication. 5.  Hyperlipidemia: Resume statin therapy 6.  History of stroke: Continue Xarelto 7.  COPD: Continue inhaled corticosteroid and anticholinergic 8.  DVT prophylaxis: Therapeutic anticoagulation 9.  GI prophylaxis: None The patient is a full code.  Time spent on admission orders and patient care approximately 45 minutes  Arnaldo Natal, MD 01/22/2019, 2:54 AM

## 2019-01-22 NOTE — ED Triage Notes (Signed)
Pt states several days of shob, abd swelling. History of chf. Pt appears in no acute distress.

## 2019-01-22 NOTE — Progress Notes (Signed)
[employer] - [facility] at [facility]                                                                                                                                                                                  Patient Demographics   James CluckSamuel Harrington, is a 63 y.o. male, DOB - 11-09-1955, NGE:[id number]  Admit date - 01/22/2019   Admitting Physician Arnaldo NatalMichael S Diamond, MD  Outpatient Primary MD for the patient is [facility]   LOS - 0  Subjective: Patient complains of shortness of breath cough and wheezing No fevers   Review of Systems:   CONSTITUTIONAL: No documented fever. No fatigue, weakness. No weight gain, no weight loss.  EYES: No blurry or double vision.  ENT: No tinnitus. No postnasal drip. No redness of the oropharynx.  RESPIRATORY: Positive cough, positive wheeze, no hemoptysis.  Positive dyspnea.  CARDIOVASCULAR: No chest pain. No orthopnea. No palpitations. No syncope.  GASTROINTESTINAL: No nausea, no vomiting or diarrhea. No abdominal pain. No melena or hematochezia.  GENITOURINARY: No dysuria or hematuria.  ENDOCRINE: No polyuria or nocturia. No heat or cold intolerance.  HEMATOLOGY: No anemia. No bruising. No bleeding.  INTEGUMENTARY: No rashes. No lesions.  MUSCULOSKELETAL: No arthritis. No swelling. No gout.  NEUROLOGIC: No numbness, tingling, or ataxia. No seizure-type activity.  PSYCHIATRIC: No anxiety. No insomnia. No ADD.    Vitals:   Vitals:   01/22/19 0300 01/22/19 0338 01/22/19 0547 01/22/19 0806  BP: (!) 146/93 (!) 172/104 (!) 145/88 (!) 158/103  Pulse: (!) 102 (!) 104 95 (!) 102  Resp: (!) 28 20  17   Temp:  98.3 F (36.8 C)  99.8 F (37.7 C)  TempSrc:  Oral  Oral  SpO2: 96% 96% 96% 98%  Weight:  96.8 kg    Height:  5\' 11"  (1.803 m)      Wt Readings from Last 3 Encounters:  01/22/19 96.8 kg  11/19/18 95.3 kg  10/13/18 97.1 kg     Intake/Output Summary (Last 24 hours) at 01/22/2019 1531 Last data filed at  01/22/2019 1441 Gross per 24 hour  Intake -  Output 1590 ml  Net -1590 ml    Physical Exam:   GENERAL: Pleasant-appearing in no apparent distress.  HEAD, EYES, EARS, NOSE AND THROAT: Atraumatic, normocephalic. Extraocular muscles are intact. Pupils equal and reactive to light. Sclerae anicteric. No conjunctival injection. No oro-pharyngeal erythema.  NECK: Supple. There is no jugular venous distention. No bruits, no lymphadenopathy, no thyromegaly.  HEART: Regular rate and rhythm,. No murmurs, no rubs, no clicks.  LUNGS:  wheezing bilaterally.  ABDOMEN: Soft, flat, nontender, nondistended. Has good bowel sounds. No hepatosplenomegaly appreciated.  EXTREMITIES: No  evidence of any cyanosis, clubbing, or peripheral edema.  +2 pedal and radial pulses bilaterally.  NEUROLOGIC: The patient is alert, awake, and oriented x3 with no focal motor or sensory deficits appreciated bilaterally.  SKIN: Moist and warm with no rashes appreciated.  Psych: Not anxious, depressed LN: No inguinal LN enlargement    Antibiotics   Anti-infectives (From admission, onward)   Start     Dose/Rate Route Frequency Ordered Stop   01/22/19 1545  doxycycline (VIBRA-TABS) tablet 100 mg     100 mg Oral Every 12 hours 01/22/19 1531        Medications   Scheduled Meds: . amiodarone  200 mg Oral Daily  . atorvastatin  80 mg Oral q1800  . budesonide (PULMICORT) nebulizer solution  0.25 mg Nebulization BID  . carvedilol  6.25 mg Oral BID WC  . docusate sodium  100 mg Oral BID  . doxycycline  100 mg Oral Q12H  . furosemide  40 mg Intravenous BID  . ipratropium-albuterol  3 mL Nebulization Q6H  . isosorbide-hydrALAZINE  1 tablet Oral BID  . methocarbamol  500 mg Oral QID  . mometasone-formoterol  2 puff Inhalation BID  . oxyCODONE  5 mg Oral BID  . Rivaroxaban  20 mg Oral Q supper  . spironolactone  25 mg Oral Daily  . traZODone  50 mg Oral QHS   Continuous Infusions: PRN Meds:.guaiFENesin-dextromethorphan,  ondansetron **OR** ondansetron (ZOFRAN) IV   Data Review:   Micro Results Recent Results (from the past 240 hour(s))  SARS Coronavirus 2 (CEPHEID - [facility]formed [facility] Hosp Order     Status: None   Collection Time: 01/22/19  1:41 AM   Specimen: Nasopharyngeal Swab  Result Value Ref Range Status   SARS Coronavirus 2 NEGATIVE NEGATIVE Final    Comment: (NOTE) If result is NEGATIVE SARS-CoV-2 target nucleic acids are NOT DETECTED. The SARS-CoV-2 RNA is generally detectable in upper and lower  respiratory specimens during the acute phase of infection. The lowest  concentration of SARS-CoV-2 viral copies this assay can detect is 250  copies / mL. A negative result does not preclude SARS-CoV-2 infection  and should not be used as the sole basis for treatment or other  patient management decisions.  A negative result may occur with  improper specimen collection / handling, submission of specimen other  than nasopharyngeal swab, presence of viral mutation(s) within the  areas targeted by this assay, and inadequate number of viral copies  (<250 copies / mL). A negative result must be combined with clinical  observations, patient history, and epidemiological information. If result is POSITIVE SARS-CoV-2 target nucleic acids are DETECTED. The SARS-CoV-2 RNA is generally detectable in upper and lower  respiratory specimens dur ing the acute phase of infection.  Positive  results are indicative of active infection with SARS-CoV-2.  Clinical  correlation with patient history and other diagnostic information is  necessary to determine patient infection status.  Positive results do  not rule out bacterial infection or co-infection with other viruses. If result is PRESUMPTIVE POSTIVE SARS-CoV-2 nucleic acids MAY BE PRESENT.   A presumptive positive result was obtained on the submitted specimen  and confirmed on repeat testing.  While 2019 novel coronavirus  (SARS-CoV-2)  nucleic acids may be present in the submitted sample  additional confirmatory testing may be necessary for epidemiological  and / or clinical management purposes  to differentiate between  SARS-CoV-2 and other Sarbecovirus currently known to infect humans.  If clinically  indicated additional testing with an alternate test  methodology [phone]) is advised. The SARS-CoV-2 RNA is generally  detectable in upper and lower respiratory sp ecimens during the acute  phase of infection. The expected result is Negative. Fact Sheet for Patients:  StrictlyIdeas.no Fact Sheet for Healthcare Providers: BankingDealers.co.za This test is not yet approved or cleared by the [place] FDA and has been authorized for detection and/or diagnosis of SARS-CoV-2 by FDA under an Emergency Use Authorization (EUA).  This EUA will remain in effect (meaning this test can be used) for the duration of the COVID-19 declaration under Section 564(b)(1) of the Act, 21 U.S.C. section 360bbb-3(b)(1), unless the authorization is terminated or revoked sooner. Performed at [facility], [address], [place], [place] [address]     Radiology Reports Dg Chest [place] 1 View  Result Date: 01/22/2019 CLINICAL DATA:  Shortness of breath EXAM: PORTABLE CHEST 1 VIEW COMPARISON:  August 18, 2018 FINDINGS: The heart size is enlarged. There are prominent interstitial lung markings bilaterally with scattered ground-glass airspace opacities. There is no pneumothorax. No large pleural effusion. There is no acute osseous abnormality. IMPRESSION: Cardiomegaly with scattered airspace opacities bilaterally and prominent interstitial lung markings. These findings are favored to represent pulmonary edema, however an atypical infectious process can have a similar appearance and should be excluded. Electronically Signed   By: Constance Holster M.D.   On: 01/22/2019 01:33      CBC Recent Labs  Lab 01/22/19 0118  WBC 8.6  HGB 14.5  HCT 47.0  PLT 173  MCV 97.3  MCH 30.0  MCHC 30.9  RDW 17.1*  LYMPHSABS 1.3  MONOABS 0.7  EOSABS 0.1  BASOSABS 0.1    Chemistries  Recent Labs  Lab 01/22/19 0118  NA 142  K 4.0  CL 105  CO2 25  GLUCOSE 133*  BUN 25*  CREATININE 1.64*  CALCIUM 9.5  AST 43*  ALT 33  ALKPHOS 292*  BILITOT 2.1*   ------------------------------------------------------------------------------------------------------------------ estimated creatinine clearance is 55.4 mL/min (A) (by C-G formula based on SCr of 1.64 mg/dL (H)). ------------------------------------------------------------------------------------------------------------------ No results for input(s): HGBA1C in the last 72 hours. ------------------------------------------------------------------------------------------------------------------ No results for input(s): CHOL, HDL, LDLCALC, TRIG, CHOLHDL, LDLDIRECT in the last 72 hours. ------------------------------------------------------------------------------------------------------------------ Recent Labs    01/22/19 0300  TSH 1.003   ------------------------------------------------------------------------------------------------------------------ No results for input(s): VITAMINB12, FOLATE, FERRITIN, TIBC, IRON, RETICCTPCT in the last 72 hours.  Coagulation profile No results for input(s): INR, PROTIME in the last 168 hours.  No results for input(s): DDIMER in the last 72 hours.  Cardiac Enzymes No results for input(s): CKMB, TROPONINI, MYOGLOBIN in the last 168 hours.  Invalid input(s): CK ------------------------------------------------------------------------------------------------------------------ Invalid input(s): [place]   This is a 63 year old male admitted for CHF exacerbation. 1.  CHF: Acute on chronic; systolic.  Last EF 30 to 35%.  Continue diuresis.  Cardiology consult  appreciated  2.  Acute bronchospasm I will place patient on nebulizer therapy he also sounds like he has acute bronchitis we will treat with doxycycline 3.  CAD: Contributing to ischemic cardiomyopathy.  Stable; continue aspirin.  Nitroglycerin as needed.  The patient is chest pain-free 4.  Hypertension: Controlled; continue BiDil, Coreg and Spironolactone.  (Awaiting med reconciliation completion). 5.  Diabetes mellitus type 2: Check hemoglobin A1c.  The patient does not appear to be on any diabetic medication. 6.  Hyperlipidemia: Resume statin therapy 7.  History of stroke: Continue Xarelto 8.  COPD: Continue inhaled corticosteroid and  anticholinergic 9.  DVT prophylaxis: Therapeutic anticoagulation 10.  GI prophylaxis: None The patient is a full code.  Time spent on admission      Code Status Orders  (From admission, onward)         Start     Ordered   01/22/19 0349  Full code  Continuous     01/22/19 0348        Code Status History    Date Active Date Inactive Code Status Order ID Comments User Context   08/18/2018 1028 08/20/2018 1510 Full Code [id number]  Adrian SaranMody, Sital, MD ED   07/26/2018 1207 07/31/2018 1751 Partial Code [id number]  Erin FullingKasa, Kurian, MD Inpatient   07/25/2018 2020 07/26/2018 1207 Full Code [id number]  Gery Prayrosley, Debby, MD Inpatient   06/13/2018 2049 06/15/2018 1706 Full Code [id number]  Gery Prayrosley, Debby, MD Inpatient   01/19/2018 2358 01/21/2018 1343 Full Code [id number]  Cammy CopaMaier, Angela, MD ED   03/26/2017 0636 03/27/2017 1729 Full Code [id number]  Arnaldo Nataliamond, Michael S, MD Inpatient   02/18/2017 2112 02/19/2017 1707 Full Code [id number]  Houston SirenSainani, Vivek J, MD Inpatient   01/05/2017 2246 01/07/2017 1614 Full Code [id number]  Altamese DillingVachhani, Vaibhavkumar, MD Inpatient   07/24/2016 0235 07/31/2016 1401 Full Code [id number]  Oralia ManisWillis, David, MD Inpatient   Advance Care Planning Activity           Consults cardiology  DVT Prophylaxis Xarelto Lab Results  Component Value Date   PLT 173 01/22/2019      Time Spent in minutes 35 minutes spent greater than 50% of time spent in care coordination and counseling patient regarding the condition and plan of care.   Auburn BilberryShreyang Darus Hershman M.D on 01/22/2019 at 3:31 PM  Between 7am to 6pm - Pager - [phone]  After 6pm go to [email or web address] - Social research officer, governmentpassword [email or web address]  [employer]   Office  [phone]

## 2019-01-22 NOTE — Progress Notes (Signed)
Advanced care plan.  Purpose of the Encounter: CODE STATUS  Parties in Attendance: Patient himself  Patient's Decision Capacity: Intact  Subjective/Patient's story: The patient with past medical history of CHF, coronary artery disease status post multiple stent placement, hypertension, hyperlipidemia, atrial fibrillation and stroke presents to the emergency department complaining of shortness of breath.    Objective/Medical story Discussed with the patient regarding his desires for cardiac and pulmonary resuscitation.   Goals of care determination:   Patient states that he would like everything to be done wants to be a full code  CODE STATUS: Full code   Time spent discussing advanced care planning: 16 minutes

## 2019-01-22 NOTE — ED Notes (Signed)
Lab here to collect repeat troponin.

## 2019-01-22 NOTE — ED Notes (Signed)
Additional warm blankets provided. Bed adjusted for comfort.

## 2019-01-22 NOTE — ED Provider Notes (Signed)
[facility] Emergency Department Provider Note   ____________________________________________   First MD Initiated Contact with Patient 01/22/19 0104     (approximate)  I have reviewed the triage vital signs and the nursing notes.   HISTORY  Chief Complaint Shortness of breath   HPI [name] is a 63 y.o. male brought to the ED from home via EMS with a chief complaint of shortness of breath and abdominal swelling.  Patient has a history of CHF.  States he is compliant with his medications.  Reports a 1 week history of progressive shortness of breath.  Cannot walk without being very short of breath with some chest tightness.  Denies fever, cough, abdominal pain, nausea or vomiting.  Denies recent travel, trauma or exposure to persons diagnosed with coronavirus.       Past Medical History:  Diagnosis Date  . CAD in native artery    a. stress echo 12/2007 abnl, EF > 55%, b. LHC 01/28/08: mLAD 30, D1 40, dLCx 70, pRCA 30, mRCA 70, mRCA lesion 2 80, PDA 90, s/p PCI/BMS to prox and distal RCA, s/p PCI/BMS to PDA; c. patient reports PCI/st[facility] x 2 in early [facility] (no records on file) d. 06/2016: cath showing patent stents along RCA and LCx with moderate 40% stenosis along the LAD.   [name]. Carotid arterial disease (HCC)    a. 05/2018 CTA Head/neck: 80-90% stenosis @ L carotid bifurcation, extending into the prox LICA. 60% stenosis @ R carotid bifurcation. 50-75% bilateral distal cavernous carotid dzs.  . Chronic combined systolic (congestive) and diastolic (congestive) heart failure (HCC)    a. 2009 Echo: > 55%; b. 06/2016: EF 25-30%; c. 12/2017 Echo: EF 30-35%, diff HK; d. 05/2018 Echo: 30-35%, mild conc LVH, diff HK. Mild MR. Mildly dil LA/RA.  [name]. Chronic kidney disease   . COPD (chronic obstructive pulmonary disease) (HCC)   . Diabetes mellitus without complication (HCC)   . Gout   . Hyperlipidemia   . Hypertension   . Hypertensive heart disease   .  Ischemic cardiomyopathy    a. 05/2018: 30-35%.  . Obesity   . Persistent atrial fibrillation 01/2017   a. cardioverted 9/18 to NSR; b. 12/2017 noted to be back in Afib-outpt dccv rec; c. CHA2DS2VASc = 6-->Xarelto.  . Stroke (cerebrum) (HCC)    a. 05/2018 MRI: small patchy acute to subacute cortial and white matter infarct in the bilat cerebrum. Mild chronic small vessel ischmia; b. 05/2018 Carotid U/S: L-carotid bifurcation 80-90% stenosed, R- carotid bifurcation 60% stenosed.  . Tobacco abuse     Patient Active Problem List   Diagnosis Date Noted  . Acute on chronic systolic CHF (congestive heart failure) (HCC) 01/22/2019  . HTN (hypertension) 09/02/2018  . DM (diabetes mellitus) (HCC) 09/02/2018  . SOB (shortness of breath) 08/18/2018  . HFrEF (heart failure with reduced ejection fraction) (HCC)   . Lactic acidosis 07/25/2018  . Hyperbilirubinemia 07/25/2018  . Carotid stenosis 06/13/2018  . Elevated troponin 06/13/2018  . Atrial fibrillation, chronic 06/13/2018  . TIA (transient ischemic attack) 06/13/2018  . TIA involving carotid artery 06/13/2018  . Atrial flutter with rapid ventricular response (HCC) 02/18/2017  . CAD in native artery   . Chest pain 01/05/2017  . Ischemic cardiomyopathy 07/26/2016  . Non-sustained ventricular tachycardia (HCC) 07/26/2016  . COPD (chronic obstructive pulmonary disease) (HCC) 07/24/2016  . CAD (coronary artery disease) 07/24/2016  . HLD (hyperlipidemia) 07/24/2016  . Tobacco abuse 07/24/2016  . Hypertensive heart disease  07/24/2016    Past Surgical History:  Procedure Laterality Date  . CARDIAC CATHETERIZATION  2009   [facility];   . CARDIAC CATHETERIZATION  2010  . CARDIAC CATHETERIZATION N/A 07/28/2016   Procedure: Right and Left Heart Cath and possible PCI;  Surgeon: Wellington Hampshire, MD;  Location: [facility];  Service: Cardiovascular;  Laterality: N/A;  . CARDIOVERSION N/A 03/27/2017   Procedure: CARDIOVERSION;  Surgeon: Wellington Hampshire, MD;  Location: [facility];  Service: Cardiovascular;  Laterality: N/A;  . CARDIOVERSION N/A 07/30/2018   Procedure: CARDIOVERSION;  Surgeon: Minna Merritts, MD;  Location: [facility];  Service: Cardiovascular;  Laterality: N/A;  . CORONARY ANGIOPLASTY  2009   s/p stent placement at [facility].    Prior to Admission medications   Medication Sig Start Date End Date Taking? Authorizing Provider  albuterol-ipratropium (COMBIVENT) 18-103 MCG/ACT inhaler Inhale 2 puffs into the lungs every 4 (four) hours as needed for wheezing.    [provider]  amiodarone (PACERONE) 200 MG tablet Take 200 mg by mouth daily.    [provider]  atorvastatin (LIPITOR) 80 MG tablet Take 80 mg by mouth daily.    [provider]  budesonide-formoterol (SYMBICORT) 160-4.5 MCG/ACT inhaler Inhale 2 puffs into the lungs 2 (two) times daily.    [provider]  carvedilol (COREG) 6.25 MG tablet Take 1 tablet (6.25 mg total) by mouth 2 (two) times daily with a meal. 07/31/18   [name], MD  docusate sodium (COLACE) 100 MG capsule Take 1 capsule (100 mg total) by mouth 2 (two) times daily. 09/02/18   Alisa Graff, FNP  HYDROcodone-acetaminophen (NORCO/VICODIN) 5-325 MG tablet hydrocodone 5 mg-acetaminophen 325 mg tablet  Take 1 tablet every 4 hours by oral route.    [provider]  isosorbide-hydrALAZINE (BIDIL) 20-37.5 MG tablet Take 1.5 tablets by mouth 2 (two) times daily.    [provider]  magnesium oxide (MAG-OX) 400 MG tablet Take 400 mg by mouth 2 (two) times daily.    [provider]  nitroGLYCERIN (NITROSTAT) 0.4 MG SL tablet Place 0.4 mg under the tongue every 5 (five) minutes as needed for chest pain.    [provider]  potassium chloride SA (K-DUR,KLOR-CON) 20 MEQ tablet Take 20 mEq by mouth daily.    [provider]  rivaroxaban (XARELTO) 15 MG TABS tablet Take 1 tablet (15 mg total) by mouth daily with supper.  08/20/18   Fritzi Mandes, MD  spironolactone (ALDACTONE) 25 MG tablet Take 1 tablet (25 mg total) by mouth daily. 09/09/18   Dunn, Areta Haber, PA-C  torsemide (DEMADEX) 20 MG tablet Take 1 tablet (20 mg total) by mouth 2 (two) times daily. 08/20/18   Fritzi Mandes, MD  traZODone (DESYREL) 50 MG tablet Take 50 mg by mouth at bedtime.    [provider]    Allergies Aspirin, Ibuprofen, and Tylenol [acetaminophen]  Family History  Problem Relation Age of Onset  . Heart attack Mother 41  . Hypertension Mother   . Cancer Brother     Social History Social History   Tobacco Use  . Smoking status: Former Smoker    Packs/day: 1.00    Years: 35.00    Pack years: 35.00    Types: Cigarettes  . Smokeless tobacco: Never Used  Substance Use Topics  . Alcohol use: Not Currently    Comment: Quit 8 yrs.  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 Drug use: No    Review of  Systems  Constitutional: No fever/chills Eyes: No visual changes. ENT: No sore throat. Cardiovascular: Denies chest pain. Respiratory: Positive for shortness of breath. Gastrointestinal: No abdominal pain.  No nausea, no vomiting.  No diarrhea.  No constipation. Genitourinary: Negative for dysuria. Musculoskeletal: Negative for back pain. Skin: Negative for rash. Neurological: Negative for headaches, focal weakness or numbness.   ____________________________________________   PHYSICAL EXAM:  VITAL SIGNS: ED Triage Vitals  Enc Vitals Group     BP      Pulse      Resp      Temp      Temp src      SpO2      Weight      Height      Head Circumference      Peak Flow      Pain Score      Pain Loc      Pain Edu?      Excl. in GC?     Constitutional: Alert and oriented. Well appearing and in mild acute distress. Eyes: Conjunctivae are normal. PERRL. EOMI. Head: Atraumatic. Nose: No congestion/rhinnorhea. Mouth/Throat: Mucous membranes are moist.  Oropharynx non-erythematous. Neck: No stridor.   Cardiovascular: Normal  rate, regular rhythm. Grossly normal heart sounds.  Good peripheral circulation. Respiratory: Normal respiratory effort.  No retractions. Lungs with bilateral rales. Gastrointestinal: Soft and nontender. No distention. No abdominal bruits. No CVA tenderness. Musculoskeletal: No lower extremity tenderness nor edema.  No joint effusions. Neurologic:  Normal speech and language. No gross focal neurologic deficits are appreciated.  Skin:  Skin is warm, dry and intact. No rash noted. Psychiatric: Mood and affect are normal. Speech and behavior are normal.  ____________________________________________   LABS (all labs ordered are listed, but only abnormal results are displayed)  Labs Reviewed  CBC WITH DIFFERENTIAL/PLATELET - Abnormal; Notable for the following components:      Result Value   RDW 17.1 (*)    All other components within normal limits  COMPREHENSIVE METABOLIC PANEL - Abnormal; Notable for the following components:   Glucose, Bld 133 (*)    BUN 25 (*)    Creatinine, Ser 1.64 (*)    Total Protein 8.6 (*)    AST 43 (*)    Alkaline Phosphatase 292 (*)    Total Bilirubin 2.1 (*)    GFR calc non Af Amer 44 (*)    GFR calc Af Amer 51 (*)    All other components within normal limits  BRAIN NATRIURETIC PEPTIDE - Abnormal; Notable for the following components:   B Natriuretic Peptide 2,247.0 (*)    All other components within normal limits  TROPONIN I (HIGH SENSITIVITY) - Abnormal; Notable for the following components:   Troponin I (High Sensitivity) 126 (*)    All other components within normal limits  SARS CORONAVIRUS 2 (HOSPITAL ORDER, PERFORMED IN [facility])  TROPONIN I (HIGH SENSITIVITY)   ____________________________________________  EKG  ED ECG REPORT I, , J, the attending physician, personally viewed and interpreted this ECG.   Date: 01/22/2019  EKG Time: 0111  Rate: 101  Rhythm: sinus tachycardia  Axis: LAD  Intervals:nonspecific  intraventricular conduction delay  ST&T Change: Nonspecific  ____________________________________________  RADIOLOGY  ED MD interpretation: CHF  Official radiology report(s): Dg Chest Port 1 View  Result Date: 01/22/2019 CLINICAL DATA:  Shortness of breath EXAM: PORTABLE CHEST 1 VIEW COMPARISON:  August 18, 2018 FINDINGS: The heart size is enlarged. There are prominent interstitial lung markings bilaterally with  scattered ground-glass airspace opacities. There is no pneumothorax. No large pleural effusion. There is no acute osseous abnormality. IMPRESSION: Cardiomegaly with scattered airspace opacities bilaterally and prominent interstitial lung markings. These findings are favored to represent pulmonary edema, however an atypical infectious process can have a similar appearance and should be excluded. Electronically Signed   By: Katherine Mantlehristopher  Green M.D.   On: 01/22/2019 01:33    ____________________________________________   PROCEDURES  Procedure(s) performed (including Critical Care):  Procedures   ____________________________________________   INITIAL IMPRESSION / ASSESSMENT AND PLAN / ED COURSE  As part of my medical decision making, I reviewed the following data within the electronic MEDICAL RECORD NUMBER Nursing notes reviewed and incorporated, Labs reviewed, EKG interpreted, Old chart reviewed, Radiograph reviewed, Discussed with admitting physician Dr. Sheryle Hailiamond and Notes from prior ED visits     James CluckSamuel Bewley was evaluated in Emergency Department on 01/22/2019 for the symptoms described in the history of present illness. He was evaluated in the context of the global COVID-19 pandemic, which necessitated consideration that the patient might be at risk for infection with the SARS-CoV-2 virus that causes COVID-19. Institutional protocols and algorithms that pertain to the evaluation of patients at risk for COVID-19 are in a state of rapid change based on information released by  regulatory bodies including the CDC and federal and state organizations. These policies and algorithms were followed during the patient's care in the ED.   63 year old male with CHF who presents with shortness of breath and abdominal swelling. Differential includes, but is not limited to, viral syndrome, bronchitis including COPD exacerbation, pneumonia, reactive airway disease including asthma, CHF including exacerbation with or without pulmonary/interstitial edema, pneumothorax, ACS, thoracic trauma, and pulmonary embolism.  Will obtain lab work, x-ray, COVID swab in anticipation of hospitalization.  Administer 1 mg Bumex for diuresis for probable CHF.  Will reassess.   Clinical Course as of Jan 22 312  Sat Jan 22, 2019  0247 COVID is negative.  Will discuss with hospitalist Dr. Sheryle Hailiamond who will evaluate patient in the emergency department for admission.   [JS]    Clinical Course User Index [JS] Irean HongSung,  J, MD     ____________________________________________   FINAL CLINICAL IMPRESSION(S) / ED DIAGNOSES  Final diagnoses:  Shortness of breath  Acute on chronic congestive heart failure, unspecified heart failure type (HCC)  Elevated troponin     ED Discharge Orders    None       Note:  This document was prepared using Dragon voice recognition software and may include unintentional dictation errors.   Irean HongSung,  J, MD 01/22/19 [phone]

## 2019-01-22 NOTE — ED Notes (Signed)
Critical troponin of 126 called from lab. Dr. Beather Arbour notified, order for bumex received, pharmacy notified of need for bumex.

## 2019-01-22 NOTE — Plan of Care (Signed)
  Problem: Activity: Goal: Capacity to carry out activities will improve Outcome: Progressing   Problem: Cardiac: Goal: Ability to achieve and maintain adequate cardiopulmonary perfusion will improve Outcome: Progressing   Problem: Education: Goal: Individualized Educational Video(s) Outcome: Not Progressing Note: Patient does not want to watch EMMI videos at this time

## 2019-01-23 DIAGNOSIS — E785 Hyperlipidemia, unspecified: Secondary | ICD-10-CM | POA: Diagnosis present

## 2019-01-23 DIAGNOSIS — R7989 Other specified abnormal findings of blood chemistry: Secondary | ICD-10-CM | POA: Diagnosis not present

## 2019-01-23 DIAGNOSIS — I959 Hypotension, unspecified: Secondary | ICD-10-CM | POA: Diagnosis not present

## 2019-01-23 DIAGNOSIS — R042 Hemoptysis: Secondary | ICD-10-CM | POA: Diagnosis not present

## 2019-01-23 DIAGNOSIS — Z20828 Contact with and (suspected) exposure to other viral communicable diseases: Secondary | ICD-10-CM | POA: Diagnosis present

## 2019-01-23 DIAGNOSIS — Z8673 Personal history of transient ischemic attack (TIA), and cerebral infarction without residual deficits: Secondary | ICD-10-CM | POA: Diagnosis not present

## 2019-01-23 DIAGNOSIS — I2510 Atherosclerotic heart disease of native coronary artery without angina pectoris: Secondary | ICD-10-CM | POA: Diagnosis present

## 2019-01-23 DIAGNOSIS — Z955 Presence of coronary angioplasty implant and graft: Secondary | ICD-10-CM | POA: Diagnosis not present

## 2019-01-23 DIAGNOSIS — E872 Acidosis: Secondary | ICD-10-CM | POA: Diagnosis present

## 2019-01-23 DIAGNOSIS — I255 Ischemic cardiomyopathy: Secondary | ICD-10-CM | POA: Diagnosis present

## 2019-01-23 DIAGNOSIS — D631 Anemia in chronic kidney disease: Secondary | ICD-10-CM | POA: Diagnosis present

## 2019-01-23 DIAGNOSIS — Z7901 Long term (current) use of anticoagulants: Secondary | ICD-10-CM | POA: Diagnosis not present

## 2019-01-23 DIAGNOSIS — I6521 Occlusion and stenosis of right carotid artery: Secondary | ICD-10-CM | POA: Diagnosis present

## 2019-01-23 DIAGNOSIS — J440 Chronic obstructive pulmonary disease with acute lower respiratory infection: Secondary | ICD-10-CM | POA: Diagnosis present

## 2019-01-23 DIAGNOSIS — I4819 Other persistent atrial fibrillation: Secondary | ICD-10-CM | POA: Diagnosis not present

## 2019-01-23 DIAGNOSIS — I130 Hypertensive heart and chronic kidney disease with heart failure and stage 1 through stage 4 chronic kidney disease, or unspecified chronic kidney disease: Secondary | ICD-10-CM | POA: Diagnosis present

## 2019-01-23 DIAGNOSIS — G4733 Obstructive sleep apnea (adult) (pediatric): Secondary | ICD-10-CM | POA: Diagnosis present

## 2019-01-23 DIAGNOSIS — E1165 Type 2 diabetes mellitus with hyperglycemia: Secondary | ICD-10-CM | POA: Diagnosis present

## 2019-01-23 DIAGNOSIS — I5043 Acute on chronic combined systolic (congestive) and diastolic (congestive) heart failure: Secondary | ICD-10-CM | POA: Diagnosis present

## 2019-01-23 DIAGNOSIS — I248 Other forms of acute ischemic heart disease: Secondary | ICD-10-CM | POA: Diagnosis present

## 2019-01-23 DIAGNOSIS — I509 Heart failure, unspecified: Secondary | ICD-10-CM

## 2019-01-23 DIAGNOSIS — J209 Acute bronchitis, unspecified: Secondary | ICD-10-CM | POA: Diagnosis present

## 2019-01-23 DIAGNOSIS — N183 Chronic kidney disease, stage 3 (moderate): Secondary | ICD-10-CM | POA: Diagnosis present

## 2019-01-23 DIAGNOSIS — I5023 Acute on chronic systolic (congestive) heart failure: Secondary | ICD-10-CM | POA: Diagnosis not present

## 2019-01-23 DIAGNOSIS — E669 Obesity, unspecified: Secondary | ICD-10-CM | POA: Diagnosis present

## 2019-01-23 DIAGNOSIS — I4892 Unspecified atrial flutter: Secondary | ICD-10-CM | POA: Diagnosis present

## 2019-01-23 DIAGNOSIS — R0602 Shortness of breath: Secondary | ICD-10-CM | POA: Diagnosis present

## 2019-01-23 DIAGNOSIS — E1122 Type 2 diabetes mellitus with diabetic chronic kidney disease: Secondary | ICD-10-CM | POA: Diagnosis present

## 2019-01-23 LAB — BASIC METABOLIC PANEL
Anion gap: 7 (ref 5–15)
BUN: 25 mg/dL — ABNORMAL HIGH (ref 8–23)
CO2: 27 mmol/L (ref 22–32)
Calcium: 8.4 mg/dL — ABNORMAL LOW (ref 8.9–10.3)
Chloride: 102 mmol/L (ref 98–111)
Creatinine, Ser: 1.49 mg/dL — ABNORMAL HIGH (ref 0.61–1.24)
GFR calc Af Amer: 57 mL/min — ABNORMAL LOW (ref >60)
GFR calc non Af Amer: 50 mL/min — ABNORMAL LOW (ref >60)
Glucose, Bld: 119 mg/dL — ABNORMAL HIGH (ref 70–99)
Potassium: 2.9 mmol/L — ABNORMAL LOW (ref 3.5–5.1)
Sodium: 136 mmol/L (ref 135–145)

## 2019-01-23 MED ORDER — BENZONATATE 100 MG PO CAPS
200.0000 mg | ORAL_CAPSULE | Freq: Three times a day (TID) | ORAL | Status: DC
  Administered 2019-01-23 – 2019-01-26 (×9): 200 mg via ORAL
  Filled 2019-01-23 (×9): qty 2

## 2019-01-23 MED ORDER — POTASSIUM CHLORIDE CRYS ER 20 MEQ PO TBCR
40.0000 meq | EXTENDED_RELEASE_TABLET | ORAL | Status: AC
  Administered 2019-01-23 (×2): 40 meq via ORAL
  Filled 2019-01-23 (×2): qty 2

## 2019-01-23 MED ORDER — GUAIFENESIN-CODEINE 100-10 MG/5ML PO SOLN
10.0000 mL | ORAL | Status: DC | PRN
  Administered 2019-01-23 – 2019-01-24 (×4): 10 mL via ORAL
  Filled 2019-01-23 (×4): qty 10

## 2019-01-23 MED ORDER — IPRATROPIUM-ALBUTEROL 0.5-2.5 (3) MG/3ML IN SOLN
3.0000 mL | Freq: Four times a day (QID) | RESPIRATORY_TRACT | Status: DC | PRN
  Administered 2019-01-23: 3 mL via RESPIRATORY_TRACT

## 2019-01-23 MED ORDER — IPRATROPIUM-ALBUTEROL 0.5-2.5 (3) MG/3ML IN SOLN
3.0000 mL | Freq: Two times a day (BID) | RESPIRATORY_TRACT | Status: DC
  Administered 2019-01-24 – 2019-01-26 (×5): 3 mL via RESPIRATORY_TRACT
  Filled 2019-01-23 (×6): qty 3

## 2019-01-23 NOTE — Progress Notes (Signed)
Progress Note  Patient Name: James CluckSamuel Harrington Date of Encounter: 01/23/2019  [facility] Cardiologist: [facility]  Subjective   Reports his abdomen feels less swollen after Lasix IV twice daily yesterday Still significant coughing, thick sputum, some of the bloody and brown, spitting into a cup  Slept well, uses CPAP at home, did not bring his in with him  Inpatient Medications    Scheduled Meds: . amiodarone  200 mg Oral Daily  . atorvastatin  80 mg Oral q1800  . benzonatate  200 mg Oral TID  . budesonide (PULMICORT) nebulizer solution  0.25 mg Nebulization BID  . carvedilol  6.25 mg Oral BID WC  . docusate sodium  100 mg Oral BID  . doxycycline  100 mg Oral Q12H  . furosemide  40 mg Intravenous BID  . ipratropium-albuterol  3 mL Nebulization Q6H  . isosorbide-hydrALAZINE  1 tablet Oral BID  . methocarbamol  500 mg Oral QID  . mometasone-formoterol  2 puff Inhalation BID  . oxyCODONE  5 mg Oral BID  . Rivaroxaban  20 mg Oral Q supper  . spironolactone  25 mg Oral Daily  . traZODone  50 mg Oral QHS   Continuous Infusions:  PRN Meds: guaiFENesin-codeine, ondansetron **OR** ondansetron (ZOFRAN) IV   Vital Signs    Vitals:   01/22/19 1924 01/23/19 0336 01/23/19 0337 01/23/19 0809  BP: (!) 138/93 114/85  119/69  Pulse: 87 81  75  Resp: 20 20    Temp: 98.5 F (36.9 C) 97.9 F (36.6 C)  (!) 97.5 F (36.4 C)  TempSrc: Oral Oral  Oral  SpO2: 100% 99%  100%  Weight:   98.7 kg   Height:        Intake/Output Summary (Last 24 hours) at 01/23/2019 1310 Last data filed at 01/23/2019 1253 Gross per 24 hour  Intake 720 ml  Output 1835 ml  Net -1115 ml   Last 3 Weights 01/23/2019 01/22/2019 01/22/2019  Weight (lbs) 217 lb 9.5 oz 213 lb 6.4 oz 215 lb  Weight (kg) 98.7 kg 96.798 kg 97.523 kg  Some encounter information is confidential and restricted. Go to Review Flowsheets activity to see all data.      Telemetry    Normal sinus rhythm- Personally Reviewed   ECG    - Personally Reviewed  Physical Exam   GEN: No acute distress.   Neck: No JVD Cardiac: RRR, no murmurs, rubs, or gallops.  Respiratory:  Clear with scattered wheezing bilaterally GI: Soft, nontender, non-distended  MS: No edema; No deformity. Neuro:  Nonfocal  Psych: Normal affect   Labs    High Sensitivity Troponin:   Recent Labs  Lab 01/22/19 0118 01/22/19 0300  TROPONINIHS 126* 122*      Cardiac EnzymesNo results for input(s): TROPONINI in the last 168 hours. No results for input(s): TROPIPOC in the last 168 hours.   Chemistry Recent Labs  Lab 01/22/19 0118 01/23/19 0534  NA 142 136  K 4.0 2.9*  CL 105 102  CO2 25 27  GLUCOSE 133* 119*  BUN 25* 25*  CREATININE 1.64* 1.49*  CALCIUM 9.5 8.4*  PROT 8.6*  --   ALBUMIN 3.9  --   AST 43*  --   ALT 33  --   ALKPHOS 292*  --   BILITOT 2.1*  --   GFRNONAA 44* 50*  GFRAA 51* 57*  ANIONGAP 12 7     Hematology Recent Labs  Lab 01/22/19 0118  WBC 8.6  RBC  4.83  HGB 14.5  HCT 47.0  MCV 97.3  MCH 30.0  MCHC 30.9  RDW 17.1*  PLT 173    BNP Recent Labs  Lab 01/22/19 0118  BNP 2,247.0*     DDimer No results for input(s): DDIMER in the last 168 hours.   Radiology    Dg Chest Port 1 View  Result Date: 01/22/2019 CLINICAL DATA:  Shortness of breath EXAM: PORTABLE CHEST 1 VIEW COMPARISON:  August 18, 2018 FINDINGS: The heart size is enlarged. There are prominent interstitial lung markings bilaterally with scattered ground-glass airspace opacities. There is no pneumothorax. No large pleural effusion. There is no acute osseous abnormality. IMPRESSION: Cardiomegaly with scattered airspace opacities bilaterally and prominent interstitial lung markings. These findings are favored to represent pulmonary edema, however an atypical infectious process can have a similar appearance and should be excluded. Electronically Signed   By: Constance Holster M.D.   On: 01/22/2019 01:33    Cardiac Studies      Patient Profile     [name] is a 63 y.o. male with a history of CAD s/p prior stenting to the RCA and RPDA, HFrEF w/ EF of 30-35% by echo 05/2018, HTN, HL, DMII, persistent afib/flutter, and carotid arterial dzs s/p recent stroke in 05/2018 - pending R CEA, who is being seen today for the evaluation of shortness of breath, abdominal swelling, chest tightness  Assessment & Plan    1.  Acute on chronic diastolic and systolic CHF Markedly elevated BNP, weight gain on arrival Shortness of breath with abdominal bloating Weight close to 215 pounds on arrival Baseline around 202 up to 204 REDS VEST measurement performed by myself at the bedside yesterday Score of 39, repeat score of 40.  Unable to repeat today as he was eating He received Lasix 40 IV twice daily yesterday, would continue again today  2.  Afib RVR:   Maintaining normal sinus rhythm On beta-blocker ,  amiodarone, anticoagulation/Xarelto  3.  Elevated troponin:  Demand ischemia in the setting of heart failure Non-trending high-sensitivity troponin No plan for ischemic work-up at this time Blood pressure heart rate much improved on today's evaluation  4.  Essential HTN Much improved on his outpatient medications  5.  HL:  LDL 79 in Dec.   Continue statin  7.  DMII:  Per IM.  8.  Carotid arterial dzs:  s/p stroke in Dec w/ 80% right carotid stenosis @ the bifurcation.    Cough 2 weeks of cough, with sputum  unable to exclude bronchitis, he is on doxycycline Cupful of thick sputum at the bedside today   Total encounter time more than 25 minutes  Greater than 50% was spent in counseling and coordination of care with the patient   For questions or updates, please contact [place] Please consult [email or web address] for contact info under        Signed, Ida Rogue, MD  01/23/2019, 1:10 PM

## 2019-01-23 NOTE — Progress Notes (Signed)
[employer] - [place] [facility]                                                                                                                                                                                  Patient Demographics   James CluckSamuel Harrington, is a 63 y.o. male, DOB - 1956/06/03, ONG:[id number]  Admit date - 01/22/2019   Admitting Physician Arnaldo NatalMichael S Diamond, MD  Outpatient Primary MD for the patient is [facility]   LOS - 0  Subjective: Patient's breathing is some improved he still says that he is short of breath and having cough   Review of Systems:   CONSTITUTIONAL: No documented fever. No fatigue, weakness. No weight gain, no weight loss.  EYES: No blurry or double vision.  ENT: No tinnitus. No postnasal drip. No redness of the oropharynx.  RESPIRATORY: Positive cough, positive wheeze, no hemoptysis.  Positive dyspnea.  CARDIOVASCULAR: No chest pain. No orthopnea. No palpitations. No syncope.  GASTROINTESTINAL: No nausea, no vomiting or diarrhea. No abdominal pain. No melena or hematochezia.  GENITOURINARY: No dysuria or hematuria.  ENDOCRINE: No polyuria or nocturia. No heat or cold intolerance.  HEMATOLOGY: No anemia. No bruising. No bleeding.  INTEGUMENTARY: No rashes. No lesions.  MUSCULOSKELETAL: No arthritis. No swelling. No gout.  NEUROLOGIC: No numbness, tingling, or ataxia. No seizure-type activity.  PSYCHIATRIC: No anxiety. No insomnia. No ADD.    Vitals:   Vitals:   01/22/19 1924 01/23/19 0336 01/23/19 0337 01/23/19 0809  BP: (!) 138/93 114/85  119/69  Pulse: 87 81  75  Resp: 20 20    Temp: 98.5 F (36.9 C) 97.9 F (36.6 C)  (!) 97.5 F (36.4 C)  TempSrc: Oral Oral  Oral  SpO2: 100% 99%  100%  Weight:   98.7 kg   Height:        Wt Readings from Last 3 Encounters:  01/23/19 98.7 kg  11/19/18 95.3 kg  10/13/18 97.1 kg     Intake/Output Summary (Last 24 hours) at 01/23/2019 1416 Last data filed at 01/23/2019  1253 Gross per 24 hour  Intake 720 ml  Output 1835 ml  Net -1115 ml    Physical Exam:   GENERAL: Pleasant-appearing in no apparent distress.  HEAD, EYES, EARS, NOSE AND THROAT: Atraumatic, normocephalic. Extraocular muscles are intact. Pupils equal and reactive to light. Sclerae anicteric. No conjunctival injection. No oro-pharyngeal erythema.  NECK: Supple. There is no jugular venous distention. No bruits, no lymphadenopathy, no thyromegaly.  HEART: Regular rate and rhythm,. No murmurs, no rubs, no clicks.  LUNGS:  wheezing bilaterally.  ABDOMEN: Soft, flat, nontender, nondistended. Has good bowel sounds. No hepatosplenomegaly appreciated.  EXTREMITIES:  No evidence of any cyanosis, clubbing, or peripheral edema.  +2 pedal and radial pulses bilaterally.  NEUROLOGIC: The patient is alert, awake, and oriented x3 with no focal motor or sensory deficits appreciated bilaterally.  SKIN: Moist and warm with no rashes appreciated.  Psych: Not anxious, depressed LN: No inguinal LN enlargement    Antibiotics   Anti-infectives (From admission, onward)   Start     Dose/Rate Route Frequency Ordered Stop   01/22/19 1545  doxycycline (VIBRA-TABS) tablet 100 mg     100 mg Oral Every 12 hours 01/22/19 1531        Medications   Scheduled Meds: . amiodarone  200 mg Oral Daily  . atorvastatin  80 mg Oral q1800  . benzonatate  200 mg Oral TID  . budesonide (PULMICORT) nebulizer solution  0.25 mg Nebulization BID  . carvedilol  6.25 mg Oral BID WC  . docusate sodium  100 mg Oral BID  . doxycycline  100 mg Oral Q12H  . furosemide  40 mg Intravenous BID  . ipratropium-albuterol  3 mL Nebulization BID  . isosorbide-hydrALAZINE  1 tablet Oral BID  . methocarbamol  500 mg Oral QID  . mometasone-formoterol  2 puff Inhalation BID  . oxyCODONE  5 mg Oral BID  . Rivaroxaban  20 mg Oral Q supper  . spironolactone  25 mg Oral Daily  . traZODone  50 mg Oral QHS   Continuous Infusions: PRN  Meds:.guaiFENesin-codeine, ipratropium-albuterol, ondansetron **OR** ondansetron (ZOFRAN) IV   Data Review:   Micro Results Recent Results (from the past 240 hour(s))  SARS Coronavirus 2 (CEPHEID - [facility] Hosp Order     Status: None   Collection Time: 01/22/19  1:41 AM   Specimen: Nasopharyngeal Swab  Result Value Ref Range Status   SARS Coronavirus 2 NEGATIVE NEGATIVE Final    Comment: (NOTE) If result is NEGATIVE SARS-CoV-2 target nucleic acids are NOT DETECTED. The SARS-CoV-2 RNA is generally detectable in upper and lower  respiratory specimens during the acute phase of infection. The lowest  concentration of SARS-CoV-2 viral copies this assay can detect is 250  copies / mL. A negative result does not preclude SARS-CoV-2 infection  and should not be used as the sole basis for treatment or other  patient management decisions.  A negative result may occur with  improper specimen collection / handling, submission of specimen other  than nasopharyngeal swab, presence of viral mutation(s) within the  areas targeted by this assay, and inadequate number of viral copies  (<250 copies / mL). A negative result must be combined with clinical  observations, patient history, and epidemiological information. If result is POSITIVE SARS-CoV-2 target nucleic acids are DETECTED. The SARS-CoV-2 RNA is generally detectable in upper and lower  respiratory specimens dur ing the acute phase of infection.  Positive  results are indicative of active infection with SARS-CoV-2.  Clinical  correlation with patient history and other diagnostic information is  necessary to determine patient infection status.  Positive results do  not rule out bacterial infection or co-infection with other viruses. If result is PRESUMPTIVE POSTIVE SARS-CoV-2 nucleic acids MAY BE PRESENT.   A presumptive positive result was obtained on the submitted specimen  and confirmed on repeat testing.   While 2019 novel coronavirus  (SARS-CoV-2) nucleic acids may be present in the submitted sample  additional confirmatory testing may be necessary for epidemiological  and / or clinical management purposes  to differentiate between  SARS-CoV-2 and  other Sarbecovirus currently known to infect humans.  If clinically indicated additional testing with an alternate test  methodology [id number]) is advised. The SARS-CoV-2 RNA is generally  detectable in upper and lower respiratory sp ecimens during the acute  phase of infection. The expected result is Negative. Fact Sheet for Patients:  BoilerBrush.com.cyhttps://www.fda.gov/media/136312/download Fact Sheet for Healthcare Providers: https://pope.com/https://www.fda.gov/media/136313/download This test is not yet approved or cleared by the [place] FDA and has been authorized for detection and/or diagnosis of SARS-CoV-2 by FDA under an Emergency Use Authorization (EUA).  This EUA will remain in effect (meaning this test can be used) for the duration of the COVID-19 declaration under Section 564(b)(1) of the Act, 21 U.S.C. section 360bbb-3(b)(1), unless the authorization is terminated or revoked sooner. Performed at [facility], [address] [place], [place] [address]     Radiology Reports Dg Chest [place]  Result Date: 01/22/2019 CLINICAL DATA:  Shortness of breath EXAM: PORTABLE CHEST 1 VIEW COMPARISON:  August 18, 2018 FINDINGS: The heart size is enlarged. There are prominent interstitial lung markings bilaterally with scattered ground-glass airspace opacities. There is no pneumothorax. No large pleural effusion. There is no acute osseous abnormality. IMPRESSION: Cardiomegaly with scattered airspace opacities bilaterally and prominent interstitial lung markings. These findings are favored to represent pulmonary edema, however an atypical infectious process can have a similar appearance and should be excluded. Electronically Signed   By: Katherine Mantlehristopher  Green  M.D.   On: 01/22/2019 01:33     CBC Recent Labs  Lab 01/22/19 0118  WBC 8.6  HGB 14.5  HCT 47.0  PLT 173  MCV 97.3  MCH 30.0  MCHC 30.9  RDW 17.1*  LYMPHSABS 1.3  MONOABS 0.7  EOSABS 0.1  BASOSABS 0.1    Chemistries  Recent Labs  Lab 01/22/19 0118 01/23/19 0534  NA 142 136  K 4.0 2.9*  CL 105 102  CO2 25 27  GLUCOSE 133* 119*  BUN 25* 25*  CREATININE 1.64* 1.49*  CALCIUM 9.5 8.4*  AST 43*  --   ALT 33  --   ALKPHOS 292*  --   BILITOT 2.1*  --    ------------------------------------------------------------------------------------------------------------------ estimated creatinine clearance is 61.6 mL/min (A) (by C-G formula based on SCr of 1.49 mg/dL (H)). ------------------------------------------------------------------------------------------------------------------ No results for input(s): HGBA1C in the last 72 hours. ------------------------------------------------------------------------------------------------------------------ No results for input(s): CHOL, HDL, LDLCALC, TRIG, CHOLHDL, LDLDIRECT in the last 72 hours. ------------------------------------------------------------------------------------------------------------------ Recent Labs    01/22/19 0300  TSH 1.003   ------------------------------------------------------------------------------------------------------------------ No results for input(s): VITAMINB12, FOLATE, FERRITIN, TIBC, IRON, RETICCTPCT in the last 72 hours.  Coagulation profile No results for input(s): INR, PROTIME in the last 168 hours.  No results for input(s): DDIMER in the last 72 hours.  Cardiac Enzymes No results for input(s): CKMB, TROPONINI, MYOGLOBIN in the last 168 hours.  Invalid input(s): CK ------------------------------------------------------------------------------------------------------------------ Invalid input(s): POCBNP    Assessment & Plan   This is a [age] year old male admitted for CHF  exacerbation. 1.  CHF: Acute on chronic; systolic.  Last EF 30 to 35%.  Continue diuresis.  Cardiology consult appreciated  2.  Acute bronchospasm continue nebulizer oral antibiotics I will add antitussives  3.  CAD: Contributing to ischemic cardiomyopathy.  Stable; continue aspirin.  Nitroglycerin as needed.  The patient is chest pain-free 4.  Hypertension: Controlled; continue BiDil, Coreg and Spironolactone.  (Awaiting med reconciliation completion). 5.  Diabetes mellitus type 2: Check hemoglobin A1c.  The patient does not appear to be on any diabetic  medication. 6.  Hyperlipidemia: Resume statin therapy 7.  History of stroke: Continue Xarelto 8.  COPD: Continue inhaled corticosteroid and anticholinergic 9.  DVT prophylaxis: Therapeutic anticoagulation 10.  GI prophylaxis: None The patient is a full code.  Time spent on admission      Code Status Orders  (From admission, onward)         Start     Ordered   01/22/19 0349  Full code  Continuous     01/22/19 0348        Code Status History    Date Active Date Inactive Code Status Order ID Comments User Context   08/18/2018 1028 08/20/2018 1510 Full Code [id number]  Bettey Costa, MD ED   07/26/2018 1207 07/31/2018 1751 Partial Code [id number]  Flora Lipps, MD Inpatient   07/25/2018 2020 07/26/2018 1207 Full Code [id number]  Quintella Baton, MD Inpatient   06/13/2018 2049 06/15/2018 1706 Full Code [id number]  Quintella Baton, MD Inpatient   01/19/2018 2358 01/21/2018 1343 Full Code [id number]  Amelia Jo, MD ED   03/26/2017 0636 03/27/2017 1729 Full Code [id number]  Harrie Foreman, MD Inpatient   02/18/2017 2112 02/19/2017 1707 Full Code [id number]  Henreitta Leber, MD Inpatient   01/05/2017 2246 01/07/2017 1614 Full Code [id number]  [name], MD Inpatient   07/24/2016 0235 07/31/2016 1401 Full Code [id number]  Lance Coon, MD Inpatient   Advance Care Planning Activity           Consults cardiology  DVT Prophylaxis  Xarelto Lab Results  Component Value Date   PLT 173 01/22/2019     Time Spent in minutes 35 minutes spent greater than 50% of time spent in care coordination and counseling patient regarding the condition and plan of care.   Dustin Flock M.D on 01/23/2019 at 2:16 PM  Between 7am to 6pm - Pager - [phone]  After 6pm go to [email or web address] - Proofreader  [employer]   Office  [phone]

## 2019-01-24 ENCOUNTER — Inpatient Hospital Stay: Payer: No Typology Code available for payment source

## 2019-01-24 LAB — CBC
HCT: 37.3 % — ABNORMAL LOW (ref 39.0–52.0)
Hemoglobin: 11.2 g/dL — ABNORMAL LOW (ref 13.0–17.0)
MCH: 29.5 pg (ref 26.0–34.0)
MCHC: 30 g/dL (ref 30.0–36.0)
MCV: 98.2 fL (ref 80.0–100.0)
Platelets: 152 10*3/uL (ref 150–400)
RBC: 3.8 MIL/uL — ABNORMAL LOW (ref 4.22–5.81)
RDW: 16.6 % — ABNORMAL HIGH (ref 11.5–15.5)
WBC: 8 10*3/uL (ref 4.0–10.5)
nRBC: 0 % (ref 0.0–0.2)

## 2019-01-24 LAB — BASIC METABOLIC PANEL
Anion gap: 7 (ref 5–15)
BUN: 30 mg/dL — ABNORMAL HIGH (ref 8–23)
CO2: 27 mmol/L (ref 22–32)
Calcium: 8.7 mg/dL — ABNORMAL LOW (ref 8.9–10.3)
Chloride: 104 mmol/L (ref 98–111)
Creatinine, Ser: 1.66 mg/dL — ABNORMAL HIGH (ref 0.61–1.24)
GFR calc Af Amer: 50 mL/min — ABNORMAL LOW (ref >60)
GFR calc non Af Amer: 44 mL/min — ABNORMAL LOW (ref >60)
Glucose, Bld: 144 mg/dL — ABNORMAL HIGH (ref 70–99)
Potassium: 3.6 mmol/L (ref 3.5–5.1)
Sodium: 138 mmol/L (ref 135–145)

## 2019-01-24 MED ORDER — SODIUM CHLORIDE 0.9% FLUSH
3.0000 mL | Freq: Two times a day (BID) | INTRAVENOUS | Status: DC
  Administered 2019-01-24 – 2019-01-26 (×4): 3 mL via INTRAVENOUS

## 2019-01-24 MED ORDER — FUROSEMIDE 10 MG/ML IJ SOLN
60.0000 mg | Freq: Two times a day (BID) | INTRAMUSCULAR | Status: DC
  Administered 2019-01-24 – 2019-01-25 (×2): 60 mg via INTRAVENOUS
  Filled 2019-01-24 (×2): qty 6

## 2019-01-24 MED ORDER — POTASSIUM CHLORIDE CRYS ER 10 MEQ PO TBCR: 10.0000 meq | EXTENDED_RELEASE_TABLET | Freq: Two times a day (BID) | ORAL | Status: DC

## 2019-01-24 MED ORDER — TORSEMIDE 20 MG PO TABS: 20.0000 mg | ORAL_TABLET | Freq: Two times a day (BID) | ORAL | Status: DC

## 2019-01-24 MED ORDER — POTASSIUM CHLORIDE CRYS ER 20 MEQ PO TBCR
20.0000 meq | EXTENDED_RELEASE_TABLET | Freq: Two times a day (BID) | ORAL | Status: DC
  Administered 2019-01-24 – 2019-01-26 (×4): 20 meq via ORAL
  Filled 2019-01-24 (×4): qty 1

## 2019-01-24 NOTE — Progress Notes (Signed)
[facility] [place] [facility]                                                                                                                                                                                  Patient Demographics   James CluckSamuel Harrington, is a 63 y.o. male, DOB - [birth year], ZOX:[id number]  Admit date - 01/22/2019   Admitting Physician Arnaldo NatalMichael S Diamond, MD  Outpatient Primary [facility] for the [facility] is [facility]   LOS - 1  Subjective: Patient continues to be short of breath also complains of abdominal distention   Review of Systems:   CONSTITUTIONAL: No documented fever. No fatigue, weakness. No weight gain, no weight loss.  EYES: No blurry or double vision.  ENT: No tinnitus. No postnasal drip. No redness of the oropharynx.  RESPIRATORY: Positive cough, positive wheeze, no hemoptysis.  Positive dyspnea.  CARDIOVASCULAR: No chest pain. No orthopnea. No palpitations. No syncope.  GASTROINTESTINAL: No nausea, no vomiting or diarrhea. No abdominal pain. No melena or hematochezia.  Positive abdominal distention GENITOURINARY: No dysuria or hematuria.  ENDOCRINE: No polyuria or nocturia. No heat or cold intolerance.  HEMATOLOGY: No anemia. No bruising. No bleeding.  INTEGUMENTARY: No rashes. No lesions.  MUSCULOSKELETAL: No arthritis. No swelling. No gout.  NEUROLOGIC: No numbness, tingling, or ataxia. No seizure-type activity.  PSYCHIATRIC: No anxiety. No insomnia. No ADD.    Vitals:   Vitals:   01/23/19 2014 01/24/19 0316 01/24/19 0750 01/24/19 0801  BP: 126/86 (!) 117/97  (!) 137/95  Pulse: 83 71 70 72  Resp: 20 20 18 18   Temp: 98.5 F (36.9 C) 98.7 F (37.1 C)    TempSrc: Oral Oral    SpO2: 97% 98% 97% 100%  Weight:  97 kg    Height:        Wt Readings from Last 3 Encounters:  01/24/19 97 kg  11/19/18 95.3 kg  10/13/18 97.1 kg     Intake/Output Summary (Last 24 hours) at 01/24/2019 1441 Last data filed at 01/24/2019  1237 Gross per 24 hour  Intake 720 ml  Output 660 ml  Net 60 ml    Physical Exam:   GENERAL: Pleasant-appearing in no apparent distress.  HEAD, EYES, EARS, NOSE AND THROAT: Atraumatic, normocephalic. Extraocular muscles are intact. Pupils equal and reactive to light. Sclerae anicteric. No conjunctival injection. No oro-pharyngeal erythema.  NECK: Supple. There is no jugular venous distention. No bruits, no lymphadenopathy, no thyromegaly.  HEART: Regular rate and rhythm,. No murmurs, no rubs, no clicks.  LUNGS:  wheezing bilaterally.  ABDOMEN: Soft, flat, nontender, nondistended. Has good bowel sounds. No hepatosplenomegaly appreciated.  Bowel distention positive EXTREMITIES: No evidence  of any cyanosis, clubbing, or peripheral edema.  +2 pedal and radial pulses bilaterally.  NEUROLOGIC: The patient is alert, awake, and oriented x3 with no focal motor or sensory deficits appreciated bilaterally.  SKIN: Moist and warm with no rashes appreciated.  Psych: Not anxious, depressed LN: No inguinal LN enlargement    Antibiotics   Anti-infectives (From admission, onward)   Start     Dose/Rate Route Frequency Ordered Stop   01/22/19 1545  doxycycline (VIBRA-TABS) tablet 100 mg     100 mg Oral Every 12 hours 01/22/19 1531        Medications   Scheduled Meds: . amiodarone  200 mg Oral Daily  . atorvastatin  80 mg Oral q1800  . benzonatate  200 mg Oral TID  . budesonide (PULMICORT) nebulizer solution  0.25 mg Nebulization BID  . carvedilol  6.25 mg Oral BID WC  . docusate sodium  100 mg Oral BID  . doxycycline  100 mg Oral Q12H  . ipratropium-albuterol  3 mL Nebulization BID  . isosorbide-hydrALAZINE  1 tablet Oral BID  . methocarbamol  500 mg Oral QID  . mometasone-formoterol  2 puff Inhalation BID  . oxyCODONE  5 mg Oral BID  . potassium chloride  20 mEq Oral BID  . Rivaroxaban  20 mg Oral Q supper  . spironolactone  25 mg Oral Daily  . torsemide  20 mg Oral BID  . traZODone   50 mg Oral QHS   Continuous Infusions: PRN Meds:.guaiFENesin-codeine, ipratropium-albuterol, ondansetron **OR** ondansetron (ZOFRAN) IV   Data Review:   Micro Results Recent Results (from the past 240 hour(s))  SARS Coronavirus 2 (CEPHEID - Performed in [place] hospital lab), Hosp Order     Status: None   Collection Time: 01/22/19  1:41 AM   Specimen: Nasopharyngeal Swab  Result Value Ref Range Status   SARS Coronavirus 2 NEGATIVE NEGATIVE Final    Comment: (NOTE) If result is NEGATIVE SARS-CoV-2 target nucleic acids are NOT DETECTED. The SARS-CoV-2 RNA is generally detectable in upper and lower  respiratory specimens during the acute phase of infection. The lowest  concentration of SARS-CoV-2 viral copies this assay can detect is 250  copies / mL. A negative result does not preclude SARS-CoV-2 infection  and should not be used as the sole basis for treatment or other  patient management decisions.  A negative result may occur with  improper specimen collection / handling, submission of specimen other  than nasopharyngeal swab, presence of viral mutation(s) within the  areas targeted by this assay, and inadequate number of viral copies  (<250 copies / mL). A negative result must be combined with clinical  observations, patient history, and epidemiological information. If result is POSITIVE SARS-CoV-2 target nucleic acids are DETECTED. The SARS-CoV-2 RNA is generally detectable in upper and lower  respiratory specimens dur ing the acute phase of infection.  Positive  results are indicative of active infection with SARS-CoV-2.  Clinical  correlation with patient history and other diagnostic information is  necessary to determine patient infection status.  Positive results do  not rule out bacterial infection or co-infection with other viruses. If result is PRESUMPTIVE POSTIVE SARS-CoV-2 nucleic acids MAY BE PRESENT.   A presumptive positive result was obtained on the  submitted specimen  and confirmed on repeat testing.  While 2019 novel coronavirus  (SARS-CoV-2) nucleic acids may be present in the submitted sample  additional confirmatory testing may be necessary for epidemiological  and / or clinical management purposes  to differentiate between  SARS-CoV-2 and other Sarbecovirus currently known to infect humans.  If clinically indicated additional testing with an alternate test  methodology [phone]([id number]) is advised. The SARS-CoV-2 RNA is generally  detectable in upper and lower respiratory sp ecimens during the acute  phase of infection. The expected result is Negative. Fact Sheet for Patients:  BoilerBrush.com.cyhttps://www.fda.gov/media/136312/download Fact Sheet for Healthcare Providers: https://pope.com/https://www.fda.gov/media/136313/download This test is not yet approved or cleared by the [place] FDA and has been authorized for detection and/or diagnosis of SARS-CoV-2 by FDA under an Emergency Use Authorization (EUA).  This EUA will remain in effect (meaning this test can be used) for the duration of the COVID-19 declaration under Section 564(b)(1) of the Act, 21 U.S.C. section 360bbb-3(b)(1), unless the authorization is terminated or revoked sooner. Performed at [facility], [address] [place], [place] [address]     Radiology Reports Dg Chest [place] 1 View  Result Date: 01/22/2019 CLINICAL DATA:  Shortness of breath EXAM: PORTABLE CHEST 1 VIEW COMPARISON:  August 18, 2018 FINDINGS: The heart size is enlarged. There are prominent interstitial lung markings bilaterally with scattered ground-glass airspace opacities. There is no pneumothorax. No large pleural effusion. There is no acute osseous abnormality. IMPRESSION: Cardiomegaly with scattered airspace opacities bilaterally and prominent interstitial lung markings. These findings are favored to represent pulmonary edema, however an atypical infectious process can have a similar appearance and should be  excluded. Electronically Signed   By: Katherine Mantlehristopher  Green M.D.   On: 01/22/2019 01:33     CBC Recent Labs  Lab 01/22/19 0118 01/24/19 0511  WBC 8.6 8.0  HGB 14.5 11.2*  HCT 47.0 37.3*  PLT 173 152  MCV 97.3 98.2  MCH 30.0 29.5  MCHC 30.9 30.0  RDW 17.1* 16.6*  LYMPHSABS 1.3  --   MONOABS 0.7  --   EOSABS 0.1  --   BASOSABS 0.1  --     Chemistries  Recent Labs  Lab 01/22/19 0118 01/23/19 0534 01/24/19 0511  NA 142 136 138  K 4.0 2.9* 3.6  CL 105 102 104  CO2 25 27 27   GLUCOSE 133* 119* 144*  BUN 25* 25* 30*  CREATININE 1.64* 1.49* 1.66*  CALCIUM 9.5 8.4* 8.7*  AST 43*  --   --   ALT 33  --   --   ALKPHOS 292*  --   --   BILITOT 2.1*  --   --    ------------------------------------------------------------------------------------------------------------------ estimated creatinine clearance is 54.8 mL/min (A) (by C-G formula based on SCr of 1.66 mg/dL (H)). ------------------------------------------------------------------------------------------------------------------ No results for input(s): HGBA1C in the last 72 hours. ------------------------------------------------------------------------------------------------------------------ No results for input(s): CHOL, HDL, LDLCALC, TRIG, CHOLHDL, LDLDIRECT in the last 72 hours. ------------------------------------------------------------------------------------------------------------------ Recent Labs    01/22/19 0300  TSH 1.003   ------------------------------------------------------------------------------------------------------------------ No results for input(s): VITAMINB12, FOLATE, FERRITIN, TIBC, IRON, RETICCTPCT in the last 72 hours.  Coagulation profile No results for input(s): INR, PROTIME in the last 168 hours.  No results for input(s): DDIMER in the last 72 hours.  Cardiac Enzymes No results for input(s): CKMB, TROPONINI, MYOGLOBIN in the last 168 hours.  Invalid input(s):  CK ------------------------------------------------------------------------------------------------------------------ Invalid input(s): POCBNP    Assessment & Plan   This is a 63 year old male admitted for CHF exacerbation. 1.  CHF: Acute on chronic; systolic.  Last EF 30 to 35%.    Changed to oral torsemide cardiology consult appreciated , abdominal distention I will order ultrasound of the abdomen to see if he has any  ascites that needs drainage 2.  Acute bronchospasm continue nebulizer oral antibiotics continue antitussives  3.  CAD: Contributing to ischemic cardiomyopathy.  Stable; continue aspirin.  Nitroglycerin as needed.  The patient is chest pain-free 4.  Hypertension: Controlled; continue BiDil, Coreg and Spironolactone.  5.  Diabetes mellitus type 2: Check hemoglobin A1c.  The patient does not appear to be on any diabetic medication. 6.  Hyperlipidemia: Resume statin therapy 7.  History of stroke: Continue Xarelto 8.  COPD: Continue inhaled corticosteroid and anticholinergic 9.  DVT prophylaxis: Therapeutic anticoagulation 10.  GI prophylaxis: None The patient is a full code.  Time spent on admission      Code Status Orders  (From admission, onward)         Start     Ordered   01/22/19 0349  Full code  Continuous     01/22/19 0348        Code Status History    Date Active Date Inactive Code Status Order ID Comments User Context   08/18/2018 1028 08/20/2018 1510 Full Code [id number]  Adrian SaranMody, Sital, MD ED   07/26/2018 1207 07/31/2018 1751 Partial Code [id number]  Erin FullingKasa, Kurian, MD Inpatient   07/25/2018 2020 07/26/2018 1207 Full Code [id number]  Gery Prayrosley, Debby, MD Inpatient   06/13/2018 2049 06/15/2018 1706 Full Code [id number]  Gery Prayrosley, Debby, MD Inpatient   01/19/2018 2358 01/21/2018 1343 Full Code [id number]  Cammy CopaMaier, Angela, MD ED   03/26/2017 0636 03/27/2017 1729 Full Code [id number]  Arnaldo Nataliamond, Michael S, MD Inpatient   02/18/2017 2112 02/19/2017 1707 Full Code [id number]  Houston SirenSainani,  Vivek J, MD Inpatient   01/05/2017 2246 01/07/2017 1614 Full Code [id number]  Altamese DillingVachhani, Vaibhavkumar, MD Inpatient   07/24/2016 0235 07/31/2016 1401 Full Code [id number]  Oralia ManisWillis, David, MD Inpatient   Advance Care Planning Activity           Consults cardiology  DVT Prophylaxis Xarelto Lab Results  Component Value Date   PLT 152 01/24/2019     Time Spent in minutes 35 minutes spent greater than 50% of time spent in care coordination and counseling patient regarding the condition and plan of care.   Auburn BilberryShreyang Reymundo Winship M.D on 01/24/2019 at 2:41 PM  Between 7am to 6pm - Pager - [phone]  After 6pm go to [email or web address] - Social research officer, governmentpassword [email or web address]  [employer]   Office  [phone]

## 2019-01-24 NOTE — TOC Initial Note (Addendum)
Transition of Care [facility]) - Initial/Assessment Note    Patient Details  Name: [name] MRN: [id number] Date of Birth: 1956-01-07  Transition of [facility]) CM/SW Contact:    Shade Flood, LCSW Phone Number: 01/24/2019, 2:48 PM  Clinical Narrative:                  Pt admitted from home. Pt is high risk for readmission. Met with pt today to assess. Per pt, he lives with his wife at home. He states that he is normally fairly independent in ADLs. His wife drives to appointments. Pt states he can obtain his medications without difficulty. Per pt, he has a cane, walker, and wheelchair at home. He states he is trying to work with the [place] to get a scooter.   Pt states he is active with [facility] for RN. He had PT as well but states he recently got discharged from those services. Pt states he thinks he might need PT back again. Spoke with Corene Cornea from [facility] to confirm/update.  Anticipating pt will return home with HH at dc. TOC will follow.  15:15 Received call back from [place] with [facility] stating that pt is not currently active with them and that apparently [facility] has him open for RN, PT, aide. Will follow up with [facility] tomorrow.   Expected Discharge Plan: [place] Barriers to Discharge: Continued Medical Work up   Patient Goals and CMS Choice        Expected Discharge Plan and Services Expected Discharge Plan: [place] In-house Referral: Clinical Social Work     Living arrangements for the past 2 months: Apartment                                      Prior Living Arrangements/Services Living arrangements for the past 2 months: Apartment Lives with:: Spouse Patient language and need for interpreter reviewed:: Yes Do you feel safe going back to the place where you live?: Yes      Need for Family Participation in Patient Care: Yes (Comment) Care giver support system in place?: Yes (comment) Current home services: Home  RN Criminal Activity/Legal Involvement Pertinent to Current Situation/Hospitalization: No - Comment as needed  Activities of Daily Living Home Assistive Devices/Equipment: None ADL Screening (condition at time of admission) Patient's cognitive ability adequate to safely complete daily activities?: No Is the patient deaf or have difficulty hearing?: No Does the patient have difficulty seeing, even when wearing glasses/contacts?: No Does the patient have difficulty concentrating, remembering, or making decisions?: No Patient able to express need for assistance with ADLs?: Yes Does the patient have difficulty dressing or bathing?: No Independently performs ADLs?: Yes (appropriate for developmental age) Does the patient have difficulty walking or climbing stairs?: No Weakness of Legs: None Weakness of Arms/Hands: None  Permission Sought/Granted                  Emotional Assessment Appearance:: Appears stated age Attitude/Demeanor/Rapport: Engaged Affect (typically observed): Pleasant Orientation: : Oriented to Self, Oriented to Place, Oriented to  Time, Oriented to Situation Alcohol / Substance Use: Not Applicable Psych Involvement: No (comment)  Admission diagnosis:  Shortness of breath [R06.02] Elevated troponin [R79.89] Acute on chronic congestive heart failure, unspecified heart failure type [facility]) [I50.9] Patient Active Problem List   Diagnosis Date Noted  . CHF (congestive heart failure) ([place]) 01/23/2019  . Acute  on chronic systolic CHF (congestive heart failure) ([place]) 01/22/2019  . HTN (hypertension) 09/02/2018  . DM (diabetes mellitus) ([place]) 09/02/2018  . SOB (shortness of breath) 08/18/2018  . HFrEF (heart failure with reduced ejection fraction) ([place])   . Lactic acidosis 07/25/2018  . Hyperbilirubinemia 07/25/2018  . Carotid stenosis 06/13/2018  . Elevated troponin 06/13/2018  . Atrial fibrillation, chronic 06/13/2018  . TIA (transient ischemic attack) 06/13/2018   . TIA involving carotid artery 06/13/2018  . Atrial flutter with rapid ventricular response ([place]) 02/18/2017  . CAD in native artery   . Chest pain 01/05/2017  . Ischemic cardiomyopathy 07/26/2016  . Non-sustained ventricular tachycardia ([place]) 07/26/2016  . COPD (chronic obstructive pulmonary disease) ([place]) 07/24/2016  . CAD (coronary artery disease) 07/24/2016  . HLD (hyperlipidemia) 07/24/2016  . Tobacco abuse 07/24/2016  . Hypertensive heart disease 07/24/2016   PCP:  [facility]   [place], [place] [address] Phone: [phone] Fax: [place] #[id number] Lorina Rabon, [place] AT [place] [address] [place] [address] Phone: [phone] Fax: [phone]     Social Determinants of Health (SDOH) Interventions    Readmission Risk Interventions Readmission Risk Prevention Plan 01/24/2019  Transportation Screening Complete  HRI or [place] Complete  Social Work Consult for [place] Planning/Counseling Complete  Palliative Care Screening Not Applicable  Medication Review Press photographer) Complete  Some recent data might be hidden

## 2019-01-24 NOTE — Progress Notes (Signed)
Progress Note  Patient Name: [name] Date of Encounter: 01/24/2019  Primary Cardiologist: Dr. Mariah MillingGollan, [facility]  Subjective   No chest pain, palpitations, or racing HR.  Feels SOB/DOE and dizzy when attempting ambulation.  Feels SOB when sitting on the bedside. Does not feel back at baseline. Reports coughing whenever he is lying in the bed and continues to produce significant sputum. Feels like he is still holding on to fluid. Does not feel ready to go home.  Inpatient Medications    Scheduled Meds: . amiodarone  200 mg Oral Daily  . atorvastatin  80 mg Oral q1800  . benzonatate  200 mg Oral TID  . budesonide (PULMICORT) nebulizer solution  0.25 mg Nebulization BID  . carvedilol  6.25 mg Oral BID WC  . docusate sodium  100 mg Oral BID  . doxycycline  100 mg Oral Q12H  . furosemide  40 mg Intravenous BID  . ipratropium-albuterol  3 mL Nebulization BID  . isosorbide-hydrALAZINE  1 tablet Oral BID  . methocarbamol  500 mg Oral QID  . mometasone-formoterol  2 puff Inhalation BID  . oxyCODONE  5 mg Oral BID  . Rivaroxaban  20 mg Oral Q supper  . spironolactone  25 mg Oral Daily  . traZODone  50 mg Oral QHS   Continuous Infusions:  PRN Meds: guaiFENesin-codeine, ipratropium-albuterol, ondansetron **OR** ondansetron (ZOFRAN) IV   Vital Signs    Vitals:   01/23/19 2014 01/24/19 0316 01/24/19 0750 01/24/19 0801  BP: 126/86 (!) 117/97  (!) 137/95  Pulse: 83 71 70 72  Resp: 20 20 18 18   Temp: 98.5 F (36.9 C) 98.7 F (37.1 C)    TempSrc: Oral Oral    SpO2: 97% 98% 97% 100%  Weight:  97 kg    Height:        Intake/Output Summary (Last 24 hours) at 01/24/2019 1000 Last data filed at 01/24/2019 0801 Gross per 24 hour  Intake 240 ml  Output 720 ml  Net -480 ml   Last 3 Weights 01/24/2019 01/23/2019 01/22/2019  Weight (lbs) 213 lb 12.8 oz 217 lb 9.5 oz 213 lb 6.4 oz  Weight (kg) 96.979 kg 98.7 kg 96.798 kg  Some encounter information is confidential and  restricted. Go to Review Flowsheets activity to see all data.      Telemetry    SR, 60-70s - Personally Reviewed  ECG    No new tracings - Personally Reviewed  Physical Exam   GEN: Sitting on edge of bed  Neck: JVD difficult to assess as cannot move off of edge of bed d/t SOB/dizziness Cardiac: distant/soft heart sounds, RRR, no murmurs, rubs, or gallops.  Respiratory: Bilateral coarse breath sounds, some bibasilar crackles, expiratory wheezing, bilateral rhonchi GI: Soft, nontender, non-distended  MS: minimal lower extremity edema; No deformity. Neuro:  Nonfocal  Psych: Normal affect   Labs    High Sensitivity Troponin:   Recent Labs  Lab 01/22/19 0118 01/22/19 0300  TROPONINIHS 126* 122*      Cardiac EnzymesNo results for input(s): TROPONINI in the last 168 hours. No results for input(s): TROPIPOC in the last 168 hours.   Chemistry Recent Labs  Lab 01/22/19 0118 01/23/19 0534 01/24/19 0511  NA 142 136 138  K 4.0 2.9* 3.6  CL 105 102 104  CO2 25 27 27   GLUCOSE 133* 119* 144*  BUN 25* 25* 30*  CREATININE 1.64* 1.49* 1.66*  CALCIUM 9.5 8.4* 8.7*  PROT 8.6*  --   --  ALBUMIN 3.9  --   --   AST 43*  --   --   ALT 33  --   --   ALKPHOS 292*  --   --   BILITOT 2.1*  --   --   GFRNONAA 44* 50* 44*  GFRAA 51* 57* 50*  ANIONGAP 12 7 7      Hematology Recent Labs  Lab 01/22/19 0118  WBC 8.6  RBC 4.83  HGB 14.5  HCT 47.0  MCV 97.3  MCH 30.0  MCHC 30.9  RDW 17.1*  PLT 173    BNP Recent Labs  Lab 01/22/19 0118  BNP 2,247.0*     DDimer No results for input(s): DDIMER in the last 168 hours.   Radiology    No results found.  Cardiac Studies   TTE 06/14/2018 - Left ventricle: The cavity size was mildly dilated. There was   mild concentric hypertrophy. Systolic function was moderately to   severely reduced. The estimated ejection fraction was in the   range of 30% to 35%. Diffuse hypokinesis. - Mitral valve: There was mild regurgitation.  - Left atrium: The atrium was mildly dilated. - Right atrium: The atrium was mildly dilated. - Pulmonary arteries: Systolic pressure could not be accurately   estimated. - Inferior vena cava: The vessel was dilated. The respirophasic   diameter changes were blunted (< 50%), consistent with elevated   central venous pressure.  Patient Profile     63 y.o. male with a history of CAD s/p prior stenting to the RCA and rPDA, HFrEF with EF of 30 to 35% by echo 05/2018, hypertension, hyperlipidemia, DM2, persistent atrial fibrillation/atrial flutter, and carotid arterial disease s/p recent stroke and 05/2018 with pending R CEA and who is being seen today for the evaluation of shortness of breath, abdominal swelling, and chest tightness.  Assessment & Plan    Acute on chronic diastolic and systolic congestive heart failure (EF 30-35%) - Continues to report SOB/DOE. Elevated BNP and weight gain on arrival. CXR showed cardiomegaly and pulmonary edema v atypical infectious process with scattered airspace opacities. - Reds vest measurement performed by cardiology over the weekend with score of 39 and repeat score of 40. 05/2018 Echo as above with severely reduced EF. --Suspect that ongoing SOB and dizziness likely multifactorial and would consider possible contributing infectious etiology given c/o cough and sputum as above. No significant LEE on exam with coarse bilateral breath sounds.  --Renal function bump overnight on IV lasix 40 mg twice daily with Kdur supplementation. Cr 1.49  1.66 and reduced urine output today despite diuresis. Wt 215lbs  213lbs (patient stated dry weight ~204-207lbs). -640cc yesterday and total -2.6L for admission.  --Recommend transition back to oral diuresis given the above bump in renal function. Of note, patient was on PTA torsemide at 20mg  BID and could restart this home oral diuresis with close monitoring of renal function. --Continue Coreg 6.25mg  BID, Bidil 20-37.45mg  BID,  spironolactone 25mg  daily. Given reduced EF and comorbid conditions of HTN/DM2, if renal function improves and BP stable, could consider [facility]B initiation with [facility]. For now, current renal function and labile pressures preclude the addition of ACE/ARB/Entresto.  Atrial fibrillation with rapid ventricular rate - Maintaining normal sinus rhythm. Rates controlled in the 70s. TSH 1.003. --CHA2DS2VASc score of at least 6 (CHF, HTN, DM2, stroke x2, vascular) with recommendation for long term anticoagulation.  --Continue Xarelto at 20mg  as does not currently meet reduced dosing criteria. - Continue beta-blocker, amiodarone 200mg   daily, Xarelto 20mg  daily.  HTN --Suboptimally controlled but labile. Hypotension precludes current escalation of Coreg to prevent pre-renal injury.No current ARB given renal function as above.  Elevated troponin  History of CAD -No chest pain. --EKG without acute changes. --Troponin minimally elevated, flat trending with peak at 125 (0.126) and down-trending. -Suspect likely etiology of Tn elevation supply demand ischemia in the setting of volume overload, possible infection, uncontrolled BP.  -No plan for ischemic work-up or invasive cardiac catheterization at this time. -Continue medical management.  Essential hypertension -Labile. Continue medical management with Coreg 6.25mg  BID as tolerated given labile pressures.  HLD --Continue high intensity statin therapy with Lipitor 80mg  daily.  AOCKD --Daily BMET. Cr 1.49  1.66. Caution with nephrotoxins. Avoid hypotension / pre-renal injury.  OSA --CPAP encouraged  DM2, poorly controlled --A1C 8.8 07/2018 --Could consider ARB if renal function/BP allows given comorbid HTN and DM2 and as reduced EF with ultimate plan to transition to [facility] as renal function /BP allows.  --Strict glucose control recommeneded   For questions or updates, please contact [place] Please consult  [email or web address] for contact info under        Signed, Arvil Chaco, PA-C  01/24/2019, 10:00 AM

## 2019-01-24 NOTE — Progress Notes (Signed)
Offered to show heart failure videos - pt refused - will attempt at a later time

## 2019-01-25 ENCOUNTER — Telehealth: Payer: Self-pay | Admitting: Physician Assistant

## 2019-01-25 DIAGNOSIS — I4819 Other persistent atrial fibrillation: Secondary | ICD-10-CM

## 2019-01-25 LAB — EXPECTORATED SPUTUM ASSESSMENT W GRAM STAIN, RFLX TO RESP C: Special Requests: NORMAL

## 2019-01-25 LAB — CBC
HCT: 38.7 % — ABNORMAL LOW (ref 39.0–52.0)
Hemoglobin: 11.8 g/dL — ABNORMAL LOW (ref 13.0–17.0)
MCH: 29.9 pg (ref 26.0–34.0)
MCHC: 30.5 g/dL (ref 30.0–36.0)
MCV: 98.2 fL (ref 80.0–100.0)
Platelets: 180 10*3/uL (ref 150–400)
RBC: 3.94 MIL/uL — ABNORMAL LOW (ref 4.22–5.81)
RDW: 16.8 % — ABNORMAL HIGH (ref 11.5–15.5)
WBC: 7.5 10*3/uL (ref 4.0–10.5)
nRBC: 0 % (ref 0.0–0.2)

## 2019-01-25 LAB — BASIC METABOLIC PANEL
Anion gap: 10 (ref 5–15)
BUN: 32 mg/dL — ABNORMAL HIGH (ref 8–23)
CO2: 25 mmol/L (ref 22–32)
Calcium: 9 mg/dL (ref 8.9–10.3)
Chloride: 101 mmol/L (ref 98–111)
Creatinine, Ser: 1.69 mg/dL — ABNORMAL HIGH (ref 0.61–1.24)
GFR calc Af Amer: 49 mL/min — ABNORMAL LOW (ref >60)
GFR calc non Af Amer: 43 mL/min — ABNORMAL LOW (ref >60)
Glucose, Bld: 132 mg/dL — ABNORMAL HIGH (ref 70–99)
Potassium: 4 mmol/L (ref 3.5–5.1)
Sodium: 136 mmol/L (ref 135–145)

## 2019-01-25 LAB — BRAIN NATRIURETIC PEPTIDE: B Natriuretic Peptide: 782 pg/mL — ABNORMAL HIGH (ref 0.0–100.0)

## 2019-01-25 MED ORDER — SENNA 8.6 MG PO TABS
2.0000 | ORAL_TABLET | Freq: Every day | ORAL | Status: DC
  Administered 2019-01-25 – 2019-01-26 (×2): 17.2 mg via ORAL
  Filled 2019-01-25 (×2): qty 2

## 2019-01-25 MED ORDER — FUROSEMIDE 10 MG/ML IJ SOLN
80.0000 mg | Freq: Two times a day (BID) | INTRAMUSCULAR | Status: DC
  Administered 2019-01-25: 80 mg via INTRAVENOUS
  Filled 2019-01-25 (×2): qty 8

## 2019-01-25 MED ORDER — POLYETHYLENE GLYCOL 3350 17 G PO PACK
17.0000 g | PACK | Freq: Every day | ORAL | Status: DC
  Administered 2019-01-25 – 2019-01-26 (×2): 17 g via ORAL
  Filled 2019-01-25 (×2): qty 1

## 2019-01-25 NOTE — Progress Notes (Addendum)
Progress Note  Patient Name: [name] Date of Encounter: 01/25/2019  Primary Cardiologist: Dr. Mariah MillingGollan, [facility]  Subjective   No chest pain this morning.  Admits to palpitations, which he stated is not unusual for him and an ongoing issue. No racing HR.  Reported that shortness of breath improved overnight and with increased IV diuresis.  He still does not feel back at baseline volume status, however. Cough improving. No further hemoptysis.   Continues to report dizziness, now stating that this is increasing in frequency and occurring at rest for hime.   Reports abdominal fullness, which he attributed to not yet having a bowel movement for the day.  Inpatient Medications    Scheduled Meds: . amiodarone  200 mg Oral Daily  . atorvastatin  80 mg Oral q1800  . benzonatate  200 mg Oral TID  . budesonide (PULMICORT) nebulizer solution  0.25 mg Nebulization BID  . carvedilol  6.25 mg Oral BID WC  . docusate sodium  100 mg Oral BID  . doxycycline  100 mg Oral Q12H  . furosemide  60 mg Intravenous BID  . ipratropium-albuterol  3 mL Nebulization BID  . isosorbide-hydrALAZINE  1 tablet Oral BID  . methocarbamol  500 mg Oral QID  . mometasone-formoterol  2 puff Inhalation BID  . oxyCODONE  5 mg Oral BID  . potassium chloride  20 mEq Oral BID  . Rivaroxaban  20 mg Oral Q supper  . sodium chloride flush  3 mL Intravenous Q12H  . spironolactone  25 mg Oral Daily  . traZODone  50 mg Oral QHS   Continuous Infusions:  PRN Meds: guaiFENesin-codeine, ipratropium-albuterol, ondansetron **OR** ondansetron (ZOFRAN) IV   Vital Signs    Vitals:   01/24/19 2006 01/25/19 0425 01/25/19 0426 01/25/19 0829  BP: (!) 139/91  109/72 (!) 146/90  Pulse: 84  67 82  Resp: 20  20 15   Temp: 98 F (36.7 C)  98.3 F (36.8 C) 97.6 F (36.4 C)  TempSrc: Oral  Oral Oral  SpO2: 100%  100% 100%  Weight:  98.3 kg    Height:        Intake/Output Summary (Last 24 hours) at 01/25/2019 0911  Last data filed at 01/25/2019 0905 Gross per 24 hour  Intake 483 ml  Output 690 ml  Net -207 ml   Last 3 Weights 01/25/2019 01/24/2019 01/23/2019  Weight (lbs) 216 lb 12.8 oz 213 lb 12.8 oz 217 lb 9.5 oz  Weight (kg) 98.34 kg 96.979 kg 98.7 kg  Some encounter information is confidential and restricted. Go to Review Flowsheets activity to see all data.      Telemetry    SR, 60-80s, borderline PR interval / first degree AVB - Personally Reviewed  ECG    No new tracings - Personally Reviewed  Physical Exam   GEN: Sitting on edge of bed, eating breakfast  Neck: JVD difficult to assess as eating breakfast and cannot move off of edge of bed d/t SOB/dizziness Cardiac: distant/soft heart sounds, RRR, no murmurs, rubs, or gallops.  Respiratory: Poor inspiratory effort, bilaterally diminished breath sounds throughout lung fields  GI: Somewhat distended, nontender  MS: 1-2+ bilateral LEE; No deformity. Neuro:  Nonfocal  Psych: Normal affect   Labs    High Sensitivity Troponin:   Recent Labs  Lab 01/22/19 0118 01/22/19 0300  TROPONINIHS 126* 122*      Cardiac EnzymesNo results for input(s): TROPONINI in the last 168 hours. No results for input(s): TROPIPOC in  the last 168 hours.   Chemistry Recent Labs  Lab 01/22/19 0118 01/23/19 0534 01/24/19 0511 01/25/19 0256  NA 142 136 138 136  K 4.0 2.9* 3.6 4.0  CL 105 102 104 101  CO2 25 27 27 25   GLUCOSE 133* 119* 144* 132*  BUN 25* 25* 30* 32*  CREATININE 1.64* 1.49* 1.66* 1.69*  CALCIUM 9.5 8.4* 8.7* 9.0  PROT 8.6*  --   --   --   ALBUMIN 3.9  --   --   --   AST 43*  --   --   --   ALT 33  --   --   --   ALKPHOS 292*  --   --   --   BILITOT 2.1*  --   --   --   GFRNONAA 44* 50* 44* 43*  GFRAA 51* 57* 50* 49*  ANIONGAP 12 7 7 10      Hematology Recent Labs  Lab 01/22/19 0118 01/24/19 0511  WBC 8.6 8.0  RBC 4.83 3.80*  HGB 14.5 11.2*  HCT 47.0 37.3*  MCV 97.3 98.2  MCH 30.0 29.5  MCHC 30.9 30.0  RDW 17.1*  16.6*  PLT 173 152    BNP Recent Labs  Lab 01/22/19 0118  BNP 2,247.0*     DDimer No results for input(s): DDIMER in the last 168 hours.   Radiology    [place] Abdomen Limited  Result Date: 01/24/2019 CLINICAL DATA:  Ascites EXAM: LIMITED ABDOMEN ULTRASOUND FOR ASCITES TECHNIQUE: Limited ultrasound survey for ascites was performed in all four abdominal quadrants. COMPARISON:  CT abdomen and pelvis 07/25/2018 FINDINGS: Imaging of the 4 quadrants was performed. No ascites is identified. IMPRESSION: No ascites. Electronically Signed   By: Ulyses SouthwardMark  Boles M.D.   On: 01/24/2019 16:37    Cardiac Studies   TTE 06/14/2018 - Left ventricle: The cavity size was mildly dilated. There was   mild concentric hypertrophy. Systolic function was moderately to   severely reduced. The estimated ejection fraction was in the   range of 30% to 35%. Diffuse hypokinesis. - Mitral valve: There was mild regurgitation. - Left atrium: The atrium was mildly dilated. - Right atrium: The atrium was mildly dilated. - Pulmonary arteries: Systolic pressure could not be accurately   estimated. - Inferior vena cava: The vessel was dilated. The respirophasic   diameter changes were blunted (< 50%), consistent with elevated   central venous pressure.  Patient Profile     63 y.o. male with a history of CAD s/p prior stenting to the RCA and rPDA, HFrEF with EF of 30 to 35% by echo 05/2018, hypertension, hyperlipidemia, DM2, persistent atrial fibrillation/atrial flutter, and carotid arterial disease s/p recent stroke and 05/2018 with pending R CEA and who is being seen today for the evaluation of shortness of breath, abdominal swelling, and chest tightness.  Assessment & Plan    Acute on chronic diastolic and systolic congestive heart failure (EF 30-35%) - Continues to report SOB/DOE, though did note somewhat improved overnight. Reporting presyncope with repeat EKG pending. Cough and hemoptysis improving today/overnight.  Elevated BNP and weight gain on arrival with repeat BNP pending. CXR showed cardiomegaly and pulmonary edema v atypical infectious process with scattered airspace opacities. Reds vest score of 39 and repeat score of 40. 05/2018 Echo as above with severely reduced EF. --Suspect that ongoing SOB and dizziness likely multifactorial given c/o cough /sputum. Cough and sputum improving today. Repeat BNP / EKG pending. Considered PE given ongoing SOB  but less likely given on PTA anticoagulation with compliance reported.  --Continues to be volume overloaded despite increased IV diuresis. If continued difficulty with diuresis, consider RHC. Renal function bumped overnight. Wt 215lbs  213lbs  216lbs  (stated dry weight ~204-207lbs).  Continue Coreg 6.25mg  BID, Bidil 20-37.45mg  BID, spironolactone 25mg  daily. Given reduced EF and comorbid conditions of HTN/DM2, if renal function improves and BP stable, could consider ARB initiation with eventual transition to [facility]. For now, current renal function and labile pressures preclude the addition of ACE/ARB/Entresto.  Atrial fibrillation with rapid ventricular rate - Maintaining normal sinus rhythm with borderline PRi. TSH 1.003. --CHA2DS2VASc score of at least 6 (CHF, HTN, DM2, stroke x2, vascular) with recommendation for long term anticoagulation. Continue Xarelto at 20mg  as does not currently meet reduced dosing criteria. No further hemoptysis reported, which was of noted given drop in Hgb in yesterday's CBC. - Continue beta-blocker, amiodarone 200mg  daily, Xarelto 20mg  daily.  HTN --Suboptimally controlled but labile. Hypotension precludes current escalation of Coreg to prevent pre-renal injury.No current ARB given renal function as above.  Elevated troponin  History of CAD -No chest pain. EKG without acute ST/T changes. Troponin minimally elevated, flat trending with peak at 125 (0.126) and down-trending. Suspect likely etiology of Tn elevation supply demand  ischemia in the setting of volume overload, possible infection, BP. No plan for ischemic work-up or invasive cardiac catheterization at this time. Continue medical management.  Essential hypertension -Labile. Continue medical management with Coreg 6.25mg  BID as tolerated given labile pressures.  HLD --Continue high intensity statin therapy with Lipitor 80mg  daily.  AOCKD --Daily BMET. Cr 1.49  1.66  1.69. Caution with nephrotoxins. Avoid hypotension / pre-renal injury.  OSA --CPAP encouraged  DM2, poorly controlled --A1C 8.8 07/2018 --Could consider ARB if renal function/BP allows given comorbid HTN and DM2 and as reduced EF with ultimate plan to transition to [facility] as renal function /BP allows.  --Strict glucose control recommeneded   For questions or updates, please contact [place] Please consult [email or web address] for contact info under        Signed, Arvil Chaco, PA-C  01/25/2019, 9:11 AM

## 2019-01-25 NOTE — Progress Notes (Signed)
Cardiovascular and Pulmonary Nurse Navigator Note:    01/25/2019 @ 12:30 p.m. ReDS Clip Reading of 73%    63 year old male with PMHX of CHF, CAD - s/p multiple stent placements, HTN, HLD, atrial fibrillation, and stroke who presented to the ED with SOB progressively worsening over the past couple weeks. Patient admitted with dx of  CHF Exacerbation.       BNP on admission 2,247;  BNP today 782.   CXR:  Revealed Pulmonary Edema.   Active Problems being addressed by the medical team this admission: 1. Acute on chronic systolic CHF. 2. Acute bronchospasm  3. CAD - stress test scheduled for tomorrow.  Troponin 122 and 126. 4. HTN - Patient currently prescribed BiDil, Coreg, and Spironolactone.   5. HLD - Statin to be ordered by MD 6. COPD   CHF Education:?? Educational session with patient completed. CHF is not a new diagnosis for this patient and the patient wanted this nurse to know this.   Provided patient with "Living Better with Heart Failure" packet. Briefly reviewed definition of heart failure and signs and symptoms of an exacerbation.?Explained to patient that HF is a chronic illness which requires self-assessment / self-management along with help from the cardiologist/PCP.?Discussed EF value, his EF and normal EF.  Last EF 06/14/2018 of 30-35%.    *Reviewed importance of and reason behind checking weight daily in the AM, after using the bathroom, but before getting dressed. Patient has functioning scale and was reminded to weigh daily.     *Reviewed with patient the following information: *Discussed when to call the Dr= weight gain of >2-3lb overnight of 5lb in a week, *Discussed yellow zone= call MD: weight gain of >2-3lb overnight of 5lb in a week, increased swelling, increased SOB when lying down, chest discomfort, dizziness, increased fatigue *Red Zone= call 911: struggle to breath, fainting or near fainting, significant chest pain *Heart Failure Zone magnet given and reviewed with  patient.  *Diet - Reviewed low sodium diet-provided handout of recommended and not recommended foods. Dietitian Consultation for diet education has been ordered and is still pending.   *Discussed fluid intake with patient as well. Patient not currently on a fluid restriction, but advised no more than 64 ounces of fluid per day.?  *Instructed patient to take medications as prescribed for heart failure. Explained briefly why pt is on the medications (either make you feel better, live longer or keep you out of the hospital) and discussed monitoring and side effects.  *Smoking Cessation- Patient is a FORMER smoker.   *Exercise / Activity - Previously patient had been followed by [facility] with RN, PT, and Aide.  Will evaluate patient tomorrow for Cardiac Rehab.  Patient is followed by Dr. Rockey Situ and the [facility].    *[place] [facility] - Patient is an established patient in the [facility].  Encouraged patient to keep his appointments.  Next appointment scheduled for 02/02/2019 at 11:30 a.m.    Again, the 5 Steps to Living Better with Heart Failure were reviewed with patient.  TEACH BACK method was used during this session.  Patient was able to answer all questions on Heart Failure Quiz.    Patient thanked me for providing the above information.  ? Roanna Epley, RN, BSN, [place]  [facility] Cardiac & Pulmonary Rehab  Cardiovascular & Pulmonary Nurse Navigator  Direct Line: [phone]  Department Phone #: [phone] Fax: [phone]  Email Address: [email or web address]

## 2019-01-25 NOTE — Progress Notes (Addendum)
[place] [facility]                                                                                                                                                                                  Patient Demographics   [name], is a 63 y.o. male, DOB - 14-Oct-1955, ONG:[id number]  Admit date - 01/22/2019   Admitting Physician Harrie Foreman, MD  Outpatient Primary MD for the patient is [facility]   LOS - 2  Subjective: Patient continues to be short of breath also complains of abdominal distention   Review of Systems:   CONSTITUTIONAL: No documented fever. No fatigue, weakness. No weight gain, no weight loss.  EYES: No blurry or double vision.  ENT: No tinnitus. No postnasal drip. No redness of the oropharynx.  RESPIRATORY: Positive cough, positive wheeze, no hemoptysis.  Positive dyspnea.  CARDIOVASCULAR: No chest pain. No orthopnea. No palpitations. No syncope.  GASTROINTESTINAL: No nausea, no vomiting or diarrhea. No abdominal pain. No melena or hematochezia.  Positive abdominal distention GENITOURINARY: No dysuria or hematuria.  ENDOCRINE: No polyuria or nocturia. No heat or cold intolerance.  HEMATOLOGY: No anemia. No bruising. No bleeding.  INTEGUMENTARY: No rashes. No lesions.  MUSCULOSKELETAL: No arthritis. No swelling. No gout.  NEUROLOGIC: No numbness, tingling, or ataxia. No seizure-type activity.  PSYCHIATRIC: No anxiety. No insomnia. No ADD.    Vitals:   Vitals:   01/24/19 2006 01/25/19 0425 01/25/19 0426 01/25/19 0829  BP: (!) 139/91  109/72 (!) 146/90  Pulse: 84  67 82  Resp: 20  20 15   Temp: 98 F (36.7 C)  98.3 F (36.8 C) 97.6 F (36.4 C)  TempSrc: Oral  Oral Oral  SpO2: 100%  100% 100%  Weight:  98.3 kg    Height:        Wt Readings from Last 3 Encounters:  01/25/19 98.3 kg  11/19/18 95.3 kg  10/13/18 97.1 kg     Intake/Output Summary (Last 24 hours) at 01/25/2019 1522 Last data filed at  01/25/2019 1431 Gross per 24 hour  Intake 483 ml  Output 925 ml  Net -442 ml    Physical Exam:   GENERAL: Pleasant-appearing in no apparent distress.  HEAD, EYES, EARS, NOSE AND THROAT: Atraumatic, normocephalic. Extraocular muscles are intact. Pupils equal and reactive to light. Sclerae anicteric. No conjunctival injection. No oro-pharyngeal erythema.  NECK: Supple. There is no jugular venous distention. No bruits, no lymphadenopathy, no thyromegaly.  HEART: Regular rate and rhythm,. No murmurs, no rubs, no clicks.  LUNGS:  wheezing bilaterally.  ABDOMEN: Soft, flat, nontender, nondistended. Has good bowel sounds. No hepatosplenomegaly appreciated.  Bowel distention positive  EXTREMITIES: No evidence of any cyanosis, clubbing, or peripheral edema.  +2 pedal and radial pulses bilaterally.  NEUROLOGIC: The patient is alert, awake, and oriented x3 with no focal motor or sensory deficits appreciated bilaterally.  SKIN: Moist and warm with no rashes appreciated.  Psych: Not anxious, depressed LN: No inguinal LN enlargement    Antibiotics   Anti-infectives (From admission, onward)   Start     Dose/Rate Route Frequency Ordered Stop   01/22/19 1545  doxycycline (VIBRA-TABS) tablet 100 mg     100 mg Oral Every 12 hours 01/22/19 1531        Medications   Scheduled Meds: . amiodarone  200 mg Oral Daily  . atorvastatin  80 mg Oral q1800  . benzonatate  200 mg Oral TID  . budesonide (PULMICORT) nebulizer solution  0.25 mg Nebulization BID  . carvedilol  6.25 mg Oral BID WC  . docusate sodium  100 mg Oral BID  . doxycycline  100 mg Oral Q12H  . furosemide  80 mg Intravenous BID  . ipratropium-albuterol  3 mL Nebulization BID  . isosorbide-hydrALAZINE  1 tablet Oral BID  . methocarbamol  500 mg Oral QID  . mometasone-formoterol  2 puff Inhalation BID  . oxyCODONE  5 mg Oral BID  . polyethylene glycol  17 g Oral Daily  . potassium chloride  20 mEq Oral BID  . Rivaroxaban  20 mg Oral Q  supper  . senna  2 tablet Oral Daily  . sodium chloride flush  3 mL Intravenous Q12H  . traZODone  50 mg Oral QHS   Continuous Infusions: PRN Meds:.guaiFENesin-codeine, ipratropium-albuterol, ondansetron **OR** ondansetron (ZOFRAN) IV   Data Review:   Micro Results Recent Results (from the past 240 hour(s))  SARS Coronavirus 2 (CEPHEID - Performed in [facility] lab), Hosp Order     Status: None   Collection Time: 01/22/19  1:41 AM   Specimen: Nasopharyngeal Swab  Result Value Ref Range Status   SARS Coronavirus 2 NEGATIVE NEGATIVE Final    Comment: (NOTE) If result is NEGATIVE SARS-CoV-2 target nucleic acids are NOT DETECTED. The SARS-CoV-2 RNA is generally detectable in upper and lower  respiratory specimens during the acute phase of infection. The lowest  concentration of SARS-CoV-2 viral copies this assay can detect is 250  copies / mL. A negative result does not preclude SARS-CoV-2 infection  and should not be used as the sole basis for treatment or other  patient management decisions.  A negative result may occur with  improper specimen collection / handling, submission of specimen other  than nasopharyngeal swab, presence of viral mutation(s) within the  areas targeted by this assay, and inadequate number of viral copies  (<250 copies / mL). A negative result must be combined with clinical  observations, patient history, and epidemiological information. If result is POSITIVE SARS-CoV-2 target nucleic acids are DETECTED. The SARS-CoV-2 RNA is generally detectable in upper and lower  respiratory specimens dur ing the acute phase of infection.  Positive  results are indicative of active infection with SARS-CoV-2.  Clinical  correlation with patient history and other diagnostic information is  necessary to determine patient infection status.  Positive results do  not rule out bacterial infection or co-infection with other viruses. If result is PRESUMPTIVE  POSTIVE SARS-CoV-2 nucleic acids MAY BE PRESENT.   A presumptive positive result was obtained on the submitted specimen  and confirmed on repeat testing.  While 2019 novel coronavirus  (SARS-CoV-2) nucleic acids may  be present in the submitted sample  additional confirmatory testing may be necessary for epidemiological  and / or clinical management purposes  to differentiate between  SARS-CoV-2 and other Sarbecovirus currently known to infect humans.  If clinically indicated additional testing with an alternate test  methodology [phone]([id number]) is advised. The SARS-CoV-2 RNA is generally  detectable in upper and lower respiratory sp ecimens during the acute  phase of infection. The expected result is Negative. Fact Sheet for Patients:  BoilerBrush.com.cyhttps://www.fda.gov/media/136312/download Fact Sheet for Healthcare Providers: https://pope.com/https://www.fda.gov/media/136313/download This test is not yet approved or cleared by the [place] FDA and has been authorized for detection and/or diagnosis of SARS-CoV-2 by FDA under an Emergency Use Authorization (EUA).  This EUA will remain in effect (meaning this test can be used) for the duration of the COVID-19 declaration under Section 564(b)(1) of the Act, 21 U.S.C. section 360bbb-3(b)(1), unless the authorization is terminated or revoked sooner. Performed at [facility], [address] [place], [place] [address]     Radiology Reports [place] Abdomen Limited  Result Date: 01/24/2019 CLINICAL DATA:  Ascites EXAM: LIMITED ABDOMEN ULTRASOUND FOR ASCITES TECHNIQUE: Limited ultrasound survey for ascites was performed in all four abdominal quadrants. COMPARISON:  CT abdomen and pelvis 07/25/2018 FINDINGS: Imaging of the 4 quadrants was performed. No ascites is identified. IMPRESSION: No ascites. Electronically Signed   By: Ulyses SouthwardMark  Boles M.D.   On: 01/24/2019 16:37   Dg Chest Port 1 View  Result Date: 01/22/2019 CLINICAL DATA:  Shortness of breath EXAM: PORTABLE  CHEST 1 VIEW COMPARISON:  August 18, 2018 FINDINGS: The heart size is enlarged. There are prominent interstitial lung markings bilaterally with scattered ground-glass airspace opacities. There is no pneumothorax. No large pleural effusion. There is no acute osseous abnormality. IMPRESSION: Cardiomegaly with scattered airspace opacities bilaterally and prominent interstitial lung markings. These findings are favored to represent pulmonary edema, however an atypical infectious process can have a similar appearance and should be excluded. Electronically Signed   By: Katherine Mantlehristopher  Green M.D.   On: 01/22/2019 01:33     CBC Recent Labs  Lab 01/22/19 0118 01/24/19 0511 01/25/19 0256  WBC 8.6 8.0 7.5  HGB 14.5 11.2* 11.8*  HCT 47.0 37.3* 38.7*  PLT 173 152 180  MCV 97.3 98.2 98.2  MCH 30.0 29.5 29.9  MCHC 30.9 30.0 30.5  RDW 17.1* 16.6* 16.8*  LYMPHSABS 1.3  --   --   MONOABS 0.7  --   --   EOSABS 0.1  --   --   BASOSABS 0.1  --   --     Chemistries  Recent Labs  Lab 01/22/19 0118 01/23/19 0534 01/24/19 0511 01/25/19 0256  NA 142 136 138 136  K 4.0 2.9* 3.6 4.0  CL 105 102 104 101  CO2 25 27 27 25   GLUCOSE 133* 119* 144* 132*  BUN 25* 25* 30* 32*  CREATININE 1.64* 1.49* 1.66* 1.69*  CALCIUM 9.5 8.4* 8.7* 9.0  AST 43*  --   --   --   ALT 33  --   --   --   ALKPHOS 292*  --   --   --   BILITOT 2.1*  --   --   --    ------------------------------------------------------------------------------------------------------------------ estimated creatinine clearance is 54.2 mL/min (A) (by C-G formula based on SCr of 1.69 mg/dL (H)). ------------------------------------------------------------------------------------------------------------------ No results for input(s): HGBA1C in the last 72 hours. ------------------------------------------------------------------------------------------------------------------ No results for input(s): CHOL, HDL, LDLCALC, TRIG, CHOLHDL, LDLDIRECT in  the last  72 hours. ------------------------------------------------------------------------------------------------------------------ No results for input(s): TSH, T4TOTAL, T3FREE, THYROIDAB in the last 72 hours.  Invalid input(s): FREET3 ------------------------------------------------------------------------------------------------------------------ No results for input(s): VITAMINB12, FOLATE, FERRITIN, TIBC, IRON, RETICCTPCT in the last 72 hours.  Coagulation profile No results for input(s): INR, PROTIME in the last 168 hours.  No results for input(s): DDIMER in the last 72 hours.  Cardiac Enzymes No results for input(s): CKMB, TROPONINI, MYOGLOBIN in the last 168 hours.  Invalid input(s): CK ------------------------------------------------------------------------------------------------------------------ Invalid input(s): POCBNP    Assessment & Plan   This is a 63 year old male admitted for CHF exacerbation. 1.  CHF: Acute on chronic; systolic.  Last EF 30 to 35%.    Back on IV Lasix 2.  Acute bronchospasm continue nebulizer oral antibiotics continue antitussives we will repeat a chest x-ray 3.  CAD: Contributing to ischemic cardiomyopathy.  Stable; continue aspirin.  Nitroglycerin as needed.  The patient is chest pain-free 4.  Hypertension: Controlled; continue BiDil, Coreg and Spironolactone.  5.  Diabetes mellitus type 2: Check hemoglobin A1c.  The patient does not appear to be on any diabetic medication. 6.  Hyperlipidemia: Resume statin therapy 7.  History of stroke: Continue Xarelto 8.  COPD: Continue inhaled corticosteroid and anticholinergic 9.  DVT prophylaxis: Therapeutic anticoagulation 10.  GI prophylaxis: None The patient is a full code.  Time spent on admission      Code Status Orders  (From admission, onward)         Start     Ordered   01/22/19 0349  Full code  Continuous     01/22/19 0348        Code Status History    Date Active Date Inactive  Code Status Order ID Comments User Context   08/18/2018 1028 08/20/2018 1510 Full Code [id number]  Adrian SaranMody, Sital, MD ED   07/26/2018 1207 07/31/2018 1751 Partial Code [id number]  Erin FullingKasa, Kurian, MD Inpatient   07/25/2018 2020 07/26/2018 1207 Full Code [id number]  Gery Prayrosley, Debby, MD Inpatient   06/13/2018 2049 06/15/2018 1706 Full Code [id number]  Gery Prayrosley, Debby, MD Inpatient   01/19/2018 2358 01/21/2018 1343 Full Code [id number]  Cammy CopaMaier, Angela, MD ED   03/26/2017 0636 03/27/2017 1729 Full Code [id number]  Arnaldo Nataliamond, Michael S, MD Inpatient   02/18/2017 2112 02/19/2017 1707 Full Code [id number]  Houston SirenSainani, Vivek J, MD Inpatient   01/05/2017 2246 01/07/2017 1614 Full Code [id number]  Altamese DillingVachhani, Vaibhavkumar, MD Inpatient   07/24/2016 0235 07/31/2016 1401 Full Code [id number]  Oralia ManisWillis, David, MD Inpatient   Advance Care Planning Activity           Consults cardiology  DVT Prophylaxis Xarelto Lab Results  Component Value Date   PLT 180 01/25/2019     Time Spent in minutes 35 minutes spent greater than 50% of time spent in care coordination and counseling patient regarding the condition and plan of care.   Auburn BilberryShreyang Wetona Viramontes M.D on 01/25/2019 at 3:22 PM  Between 7am to 6pm - Pager - [phone]  After 6pm go to [email or web address] - Social research officer, governmentpassword [email or web address]  [employer]   Office  [phone]

## 2019-01-25 NOTE — Telephone Encounter (Signed)
Update already to given to the pt wife.

## 2019-01-25 NOTE — Telephone Encounter (Signed)
Please inform patient's wife patient is currently on our census and being rounded on by our group.

## 2019-01-25 NOTE — Telephone Encounter (Signed)
FYI- update fwd to Christell Faith, PA

## 2019-01-25 NOTE — Progress Notes (Signed)
Continues to decline to watch CHF videos

## 2019-01-25 NOTE — Plan of Care (Signed)
  Problem: Education: Goal: Knowledge of General Education information will improve Description: Including pain rating scale, medication(s)/side effects and non-pharmacologic comfort measures Outcome: Progressing   Problem: Clinical Measurements: Goal: Ability to maintain clinical measurements within normal limits will improve Outcome: Progressing Goal: Will remain free from infection Outcome: Progressing Note: Remains afebrile Goal: Cardiovascular complication will be avoided Outcome: Progressing Note: Denies chest pain   Problem: Education: Goal: Ability to demonstrate management of disease process will improve Outcome: Not Progressing Goal: Individualized Educational Video(s) Outcome: Not Progressing Note: Pt has not interest in video's or continued education on HF   Problem: Clinical Measurements: Goal: Respiratory complications will improve Outcome: Not Progressing Note: Pt states SOB when ambulating, with some associated dizziness

## 2019-01-25 NOTE — Telephone Encounter (Signed)
Patients wife called in and wanted to make [name] aware of patients hospitalization. She asked if [name] could go see the patient, it was then explained to her that while he is not scheduled for hospital hours this week we will have other providers that are available. With this information she was pleased.

## 2019-01-26 ENCOUNTER — Inpatient Hospital Stay: Payer: No Typology Code available for payment source

## 2019-01-26 LAB — BASIC METABOLIC PANEL
Anion gap: 9 (ref 5–15)
BUN: 36 mg/dL — ABNORMAL HIGH (ref 8–23)
CO2: 26 mmol/L (ref 22–32)
Calcium: 9.6 mg/dL (ref 8.9–10.3)
Chloride: 102 mmol/L (ref 98–111)
Creatinine, Ser: 1.76 mg/dL — ABNORMAL HIGH (ref 0.61–1.24)
GFR calc Af Amer: 47 mL/min — ABNORMAL LOW (ref >60)
GFR calc non Af Amer: 41 mL/min — ABNORMAL LOW (ref >60)
Glucose, Bld: 126 mg/dL — ABNORMAL HIGH (ref 70–99)
Potassium: 4.6 mmol/L (ref 3.5–5.1)
Sodium: 137 mmol/L (ref 135–145)

## 2019-01-26 LAB — CBC
HCT: 41.8 % (ref 39.0–52.0)
Hemoglobin: 12.8 g/dL — ABNORMAL LOW (ref 13.0–17.0)
MCH: 29.4 pg (ref 26.0–34.0)
MCHC: 30.6 g/dL (ref 30.0–36.0)
MCV: 95.9 fL (ref 80.0–100.0)
Platelets: 212 10*3/uL (ref 150–400)
RBC: 4.36 MIL/uL (ref 4.22–5.81)
RDW: 16.6 % — ABNORMAL HIGH (ref 11.5–15.5)
WBC: 6.1 10*3/uL (ref 4.0–10.5)
nRBC: 0 % (ref 0.0–0.2)

## 2019-01-26 MED ORDER — DOXYCYCLINE HYCLATE 100 MG PO TABS: 100.0000 mg | ORAL_TABLET | Freq: Two times a day (BID) | ORAL | 0 refills | Status: AC

## 2019-01-26 MED ORDER — TORSEMIDE 20 MG PO TABS: 40.0000 mg | ORAL_TABLET | Freq: Two times a day (BID) | ORAL | 0 refills | Status: DC

## 2019-01-26 NOTE — Care Management Important Message (Signed)
Important Message  Patient Details  Name: [name] MRN: [id number] Date of Birth: 05/18/1956   Medicare Important Message Given:  Yes     Dannette Barbara 01/26/2019, 11:41 AM

## 2019-01-26 NOTE — Discharge Summary (Signed)
[place] [facility] [place] NAME: [name]    MR#:  [id number]  DATE OF BIRTH:  16-Nov-1955  DATE OF ADMISSION:  01/22/2019 ADMITTING PHYSICIAN: Harrie Foreman, MD  DATE OF DISCHARGE: 01/26/2019 12:55 PM  PRIMARY CARE PHYSICIAN: Administration, Veterans   ADMISSION DIAGNOSIS:  Shortness of breath [R06.02] Elevated troponin [R79.89] Acute on chronic congestive heart failure, unspecified heart failure type ([place]) [I50.9]  DISCHARGE DIAGNOSIS:  Active Problems:   Acute on chronic systolic CHF (congestive heart failure) (HCC)   CHF (congestive heart failure) (HCC)   Persistent atrial fibrillation Acute bronchitis Acute on chronic systolic heart failure exacerbation Coronary artery disease Hypertension Type 2 diabetes mellitus Elevated troponin secondary demand ischemia  SECONDARY DIAGNOSIS:   Past Medical History:  Diagnosis Date  . CAD in native artery    a. stress echo 12/2007 abnl, EF > 55%, b. LHC 01/28/08: mLAD 30, D1 40, dLCx 67, pRCA 30, mRCA 70, mRCA lesion 2 80, PDA 90, s/p PCI/BMS to prox and distal RCA, s/p PCI/BMS to PDA; c. patient reports PCI/stenting x 2 in early 2017 at the [place] (no records on file) d. 06/2016: cath showing patent stents along RCA and LCx with moderate 40% stenosis along the LAD.   [name] Carotid arterial disease ([place])    a. 05/2018 CTA Head/neck: 80-90% stenosis @ L carotid bifurcation, extending into the prox LICA. 60% stenosis @ R carotid bifurcation. 50-75% bilateral distal cavernous carotid dzs.  . Chronic combined systolic (congestive) and diastolic (congestive) heart failure ([place])    a. 2009 Echo: > 55%; b. 06/2016: EF 25-30%; c. 12/2017 Echo: EF 30-35%, diff HK; d. 05/2018 Echo: 30-35%, mild conc LVH, diff HK. Mild MR. Mildly dil LA/RA.  [name] Chronic kidney disease   . COPD (chronic obstructive pulmonary disease) ([place])   . Diabetes mellitus without complication ([place])   . Gout   . Hyperlipidemia   . Hypertension    . Hypertensive heart disease   . Ischemic cardiomyopathy    a. 05/2018: 30-35%.  . Obesity   . Persistent atrial fibrillation 01/2017   a. cardioverted 9/18 to NSR; b. 12/2017 noted to be back in Afib-outpt dccv rec; c. CHA2DS2VASc = 6-->Xarelto.  . Stroke (cerebrum) ([place])    a. 05/2018 MRI: small patchy acute to subacute cortial and white matter infarct in the bilat cerebrum. Mild chronic small vessel ischmia; b. 05/2018 Carotid U/S: L-carotid bifurcation 80-90% stenosed, R- carotid bifurcation 60% stenosed.  . Tobacco abuse      ADMITTING HISTORY The patient with past medical history of CHF, coronary artery disease status post multiple stent placement, hypertension, hyperlipidemia, atrial fibrillation and stroke presents to the emergency department complaining of shortness of breath.  The patient reports that his dyspnea has been progressive over the last several days to weeks.  He is also developed some chest pain/tightness with mild exertion.  Laboratory evaluation was significant for dramatically elevated BNP as well as elevated troponin.  Chest x-ray showed pulmonary edema.  Patient was then given Bumex prior to the emergency department staff called the hospitalist service for admission.  HOSPITAL COURSE:  Patient was admitted to telemetry.  Diuresed with IV Lasix aggressively 80 mg twice daily.  Was seen by cardiology during the hospitalization.  Patient continued Xarelto for anticoagulation.  Patient continue nebulizer therapy for bronchospasm along with antitussive medication.  Doxycycline antibiotic was given for bronchitis.  Cough and shortness of breath improved.  Swelling in the lower extremities also improved.  Patient will be discharged on oral torsemide as per recommendation by cardiology.  COVID-19 test is negative.  Sputum culture did not reveal any growth at the time of discharge.  Patient CHF exacerbation has improved.  Has been weaned off oxygen.  CONSULTS OBTAINED:   Treatment Team:  Antonieta IbaGollan, Timothy J, MD  DRUG ALLERGIES:   Allergies  Allergen Reactions  . Aspirin Other (See Comments)    Told not to take because of blood thinner  . Ibuprofen Nausea And Vomiting  . Tylenol [Acetaminophen] Nausea And Vomiting    DISCHARGE MEDICATIONS:   Allergies as of 01/26/2019      Reactions   Aspirin Other (See Comments)   Told not to take because of blood thinner   Ibuprofen Nausea And Vomiting   Tylenol [acetaminophen] Nausea And Vomiting      Medication List    TAKE these medications   atorvastatin 80 MG tablet Commonly known as: LIPITOR Take 80 mg by mouth daily.   budesonide-formoterol 160-4.5 MCG/ACT inhaler Commonly known as: SYMBICORT Inhale 2 puffs into the lungs 2 (two) times daily.   carvedilol 6.25 MG tablet Commonly known as: COREG Take 1 tablet (6.25 mg total) by mouth 2 (two) times daily with a meal.   Combivent 18-103 MCG/ACT inhaler Generic drug: albuterol-ipratropium Inhale 2 puffs into the lungs every 4 (four) hours as needed for wheezing.   docusate sodium 100 MG capsule Commonly known as: COLACE Take 1 capsule (100 mg total) by mouth 2 (two) times daily.   doxycycline 100 MG tablet Commonly known as: VIBRA-TABS Take 1 tablet (100 mg total) by mouth every 12 (twelve) hours for 5 days.   isosorbide-hydrALAZINE 20-37.5 MG tablet Commonly known as: BIDIL Take 1 tablet by mouth 2 (two) times daily.   magnesium oxide 400 MG tablet Commonly known as: MAG-OX Take 400 mg by mouth 2 (two) times daily.   methocarbamol 500 MG tablet Commonly known as: ROBAXIN Take 500 mg by mouth 4 (four) times daily.   nitroGLYCERIN 0.4 MG SL tablet Commonly known as: NITROSTAT Place 0.4 mg under the tongue every 5 (five) minutes as needed for chest pain.   oxyCODONE 5 MG immediate release tablet Commonly known as: Oxy IR/ROXICODONE Take 5 mg by mouth 2 (two) times a day.   potassium chloride SA 20 MEQ tablet Commonly known as:  K-DUR Take 20 mEq by mouth daily.   Rivaroxaban 15 MG Tabs tablet Commonly known as: XARELTO Take 1 tablet (15 mg total) by mouth daily with supper. What changed: how much to take   spironolactone 25 MG tablet Commonly known as: ALDACTONE Take 1 tablet (25 mg total) by mouth daily.   torsemide 20 MG tablet Commonly known as: Demadex Take 2 tablets (40 mg total) by mouth 2 (two) times daily. What changed: how much to take   traZODone 50 MG tablet Commonly known as: DESYREL Take 50 mg by mouth at bedtime.       Today  Patient seen today Shortness of breath is resolved Hemodynamically stable Tolerating diet well VITAL SIGNS:  Blood pressure (!) 131/92, pulse 87, temperature (!) 97.5 F (36.4 C), temperature source Oral, resp. rate 18, height 5\' 11"  (1.803 m), weight 97.8 kg, SpO2 100 %.  I/O:    Intake/Output Summary (Last 24 hours) at 01/26/2019 1356 Last data filed at 01/26/2019 0928 Gross per 24 hour  Intake 660 ml  Output 1175 ml  Net -515 ml    PHYSICAL EXAMINATION:  Physical Exam  GENERAL:  63 y.o.-year-old patient lying in the bed with no acute distress.  LUNGS: Normal breath sounds bilaterally, no wheezing, rales,rhonchi or crepitation. No use of accessory muscles of respiration.  CARDIOVASCULAR: S1, S2 normal. No murmurs, rubs, or gallops.  ABDOMEN: Soft, non-tender, non-distended. Bowel sounds present. No organomegaly or mass.  NEUROLOGIC: Moves all 4 extremities. PSYCHIATRIC: The patient is alert and oriented x 3.  SKIN: No obvious rash, lesion, or ulcer.   DATA REVIEW:   CBC Recent Labs  Lab 01/26/19 0330  WBC 6.1  HGB 12.8*  HCT 41.8  PLT 212    Chemistries  Recent Labs  Lab 01/22/19 0118  01/26/19 0330  NA 142   < > 137  K 4.0   < > 4.6  CL 105   < > 102  CO2 25   < > 26  GLUCOSE 133*   < > 126*  BUN 25*   < > 36*  CREATININE 1.64*   < > 1.76*  CALCIUM 9.5   < > 9.6  AST 43*  --   --   ALT 33  --   --   ALKPHOS 292*  --   --    BILITOT 2.1*  --   --    < > = values in this interval not displayed.    Cardiac Enzymes No results for input(s): TROPONINI in the last 168 hours.  Microbiology Results  Results for orders placed or performed during the hospital encounter of 01/22/19  SARS Coronavirus 2 (CEPHEID - Performed [facility]), Hosp Order     Status: None   Collection Time: 01/22/19  1:41 AM   Specimen: Nasopharyngeal Swab  Result Value Ref Range Status   SARS Coronavirus 2 NEGATIVE NEGATIVE Final    Comment: (NOTE) If result is NEGATIVE SARS-CoV-2 target nucleic acids are NOT DETECTED. The SARS-CoV-2 RNA is generally detectable in upper and lower  respiratory specimens during the acute phase of infection. The lowest  concentration of SARS-CoV-2 viral copies this assay can detect is 250  copies / mL. A negative result does not preclude SARS-CoV-2 infection  and should not be used as the sole basis for treatment or other  patient management decisions.  A negative result may occur with  improper specimen collection / handling, submission of specimen other  than nasopharyngeal swab, presence of viral mutation(s) within the  areas targeted by this assay, and inadequate number of viral copies  (<250 copies / mL). A negative result must be combined with clinical  observations, patient history, and epidemiological information. If result is POSITIVE SARS-CoV-2 target nucleic acids are DETECTED. The SARS-CoV-2 RNA is generally detectable in upper and lower  respiratory specimens dur ing the acute phase of infection.  Positive  results are indicative of active infection with SARS-CoV-2.  Clinical  correlation with patient history and other diagnostic information is  necessary to determine patient infection status.  Positive results do  not rule out bacterial infection or co-infection with other viruses. If result is PRESUMPTIVE POSTIVE SARS-CoV-2 nucleic acids MAY BE PRESENT.   A presumptive  positive result was obtained on the submitted specimen  and confirmed on repeat testing.  While 2019 novel coronavirus  (SARS-CoV-2) nucleic acids may be present in the submitted sample  additional confirmatory testing may be necessary for epidemiological  and / or clinical management purposes  to differentiate between  SARS-CoV-2 and other Sarbecovirus currently known to infect humans.  If clinically indicated additional testing with an  alternate test  methodology [phone]) is advised. The SARS-CoV-2 RNA is generally  detectable in upper and lower respiratory sp ecimens during the acute  phase of infection. The expected result is Negative. Fact Sheet for Patients:  BoilerBrush.com.cy Fact Sheet for Healthcare Providers: https://pope.com/ This test is not yet approved or cleared by the [place] FDA and has been authorized for detection and/or diagnosis of SARS-CoV-2 by FDA under an Emergency Use Authorization (EUA).  This EUA will remain in effect (meaning this test can be used) for the duration of the COVID-19 declaration under Section 564(b)(1) of the Act, 21 U.S.C. section 360bbb-3(b)(1), unless the authorization is terminated or revoked sooner. Performed at [facility], [address] [place], [place] [address]   Expectorated sputum assessment w rflx to resp cult     Status: None   Collection Time: 01/25/19  2:36 PM   Specimen: Expectorated Sputum  Result Value Ref Range Status   Specimen Description EXPECTORATED SPUTUM  Final   Special Requests Normal  Final   Sputum evaluation   Final    Sputum specimen not acceptable for testing.  Please recollect.   REQUEST FOR RECOLLECT CALLED TO TANYA BERRY AT 1612 ON 01/25/2019 [facility]. Performed at [facility], [address] [place], [place] [address]    Report Status 01/25/2019 FINAL  Final    RADIOLOGY:  Dg Chest 2 View  Result Date: 01/26/2019 CLINICAL  DATA:  Shortness of breath and cough. EXAM: CHEST - 2 VIEW COMPARISON:  01/22/2019 FINDINGS: Lungs are adequately inflated with mild interval improvement and patchy hazy bilateral airspace opacity. No effusion. Borderline stable cardiomegaly. Remainder of the exam is unchanged. IMPRESSION: Slight interval improvement and minimal hazy bilateral patchy airspace opacification which may be due to improving edema or infection. Electronically Signed   By: Elberta Fortis M.D.   On: 01/26/2019 09:34   US Abdomen Limited  Result Date: 01/24/2019 CLINICAL DATA:  Ascites EXAM: LIMITED ABDOMEN ULTRASOUND FOR ASCITES TECHNIQUE: Limited ultrasound survey for ascites was performed in all four abdominal quadrants. COMPARISON:  CT abdomen and pelvis 07/25/2018 FINDINGS: Imaging of the 4 quadrants was performed. No ascites is identified. IMPRESSION: No ascites. Electronically Signed   By: Ulyses Southward M.D.   On: 01/24/2019 16:37    Follow up with PCP in 1 week.  Management plans discussed with the patient, family and they are in agreement.  CODE STATUS: Full code    Code Status Orders  (From admission, onward)         Start     Ordered   01/22/19 0349  Full code  Continuous     01/22/19 0348        Code Status History    Date Active Date Inactive Code Status Order ID Comments User Context   08/18/2018 1028 08/20/2018 1510 Full Code [id number]  Adrian Saran, MD ED   07/26/2018 1207 07/31/2018 1751 Partial Code [id number]  Erin Fulling, MD Inpatient   07/25/2018 2020 07/26/2018 1207 Full Code [id number]  Gery Pray, MD Inpatient   06/13/2018 2049 06/15/2018 1706 Full Code [id number]  Gery Pray, MD Inpatient   01/19/2018 2358 01/21/2018 1343 Full Code [id number]  Cammy Copa, MD ED   03/26/2017 0636 03/27/2017 1729 Full Code [id number]  Arnaldo Natal, MD Inpatient   02/18/2017 2112 02/19/2017 1707 Full Code [id number]  Houston Siren, MD Inpatient   01/05/2017 2246 01/07/2017 1614 Full Code [id number]  Altamese Dilling, MD Inpatient   07/24/2016 0235 07/31/2016 1401  Full Code [id number]  Oralia ManisWillis, David, MD Inpatient   Advance Care Planning Activity      TOTAL TIME TAKING CARE OF THIS PATIENT ON DAY OF DISCHARGE: more than 35 minutes.   Ihor AustinPavan  M.D on 01/26/2019 at 1:56 PM  Between 7am to 6pm - Pager - [phone]  After 6pm go to [email or web address] - password [email or web address]  [facility] [place] [facility]  Office  [phone]  CC: Primary care physician; Administration, Veterans  Note: This dictation was prepared with Nurse, children'sDragon dictation along with smaller Lobbyistphrase technology. Any transcriptional errors that result from this process are unintentional.

## 2019-01-26 NOTE — Progress Notes (Signed)
Patient discharged to home.  Tele and IV d/c'd prior to discharge. Patient verbalizes understanding of discharge instructions. 

## 2019-01-26 NOTE — Progress Notes (Signed)
Cardiovascular and Pulmonary Nurse Navigator Note:    Rounded on patient this a.m.  Patient reporting he feels much better today. No lower extremity edema.  Patient walking around the room without c/o dizziness nor SOB.  Patient reports his coughing has improved and his abdomen does not feel as tight.  Patient reported having had a BM yesterday.    Rounded on patient to obtain ReDS PRO Clip reading:   0900 Weight 215.2 pounds (97.818 kg) - yesterday weight was 217.    0900 ReDS Clip reading Today - 33% down from 37% yesterday.  Dr. Rockey Situ informed of the above values   Roanna Epley, RN, BSN, [facility]  Cardiovascular & Pulmonary Nurse Navigator  Direct Line: [phone]  Department Phone #: [phone] Fax: [phone]  Email Address: [email or web address]

## 2019-01-26 NOTE — Progress Notes (Signed)
Patient's ReDS Clip reading 33% today.  Yesterday reading was 37%.

## 2019-01-26 NOTE — Plan of Care (Signed)
Nutrition Education Note  RD consulted for nutrition education regarding CHF.  63 year old male with PMHx of CAD, CHF, CKD, COPD, DM, gout, HLD, HTN, A-fib, hx CVA admitted with CHF exacerbation.  Met with patient at bedside. He is known to this RD from previous assessment in 06/2018. At that time he had a decreased appetite and intake in setting of abdominal pain and N/V. Patient reports his appetite has since improved and returned to baseline. He reports eating two meals per day. For breakfast he usually has eggs and toast. For his second meal of the day he may have meat with vegetables. He does not eat out at restaurants often and tries to eat food prepared at home. He limits intake of processed foods. When he does eat food from a can he rinses the salt off. He does not add any salt to food. Reviewed education with patient. He did not realize that hot sauce and other condiments also contained salt. He reports using a lot of condiments on certain foods.  RD provided "Heart Failure Nutrition Therapy" handout from the Academy of Nutrition and Dietetics. Reviewed patient's dietary recall. Provided examples on ways to decrease sodium intake in diet. Discouraged intake of processed foods and use of salt shaker. Encouraged fresh fruits and vegetables as well as whole grain sources of carbohydrates to maximize fiber intake.   RD discussed why it is important for patient to adhere to diet recommendations, and emphasized the role of fluids, foods to avoid, and importance of weighing self daily. Teach back method used.  Expect good compliance.  Body mass index is 30.08 kg/m. Pt meets criteria for obesity class I based on current BMI.  Current diet order is heart healthy, patient is consuming approximately 100% of meals at this time. Labs and medications reviewed. No further nutrition interventions warranted at this time. RD contact information provided. If additional nutrition issues arise, please re-consult  RD.   Willey Blade, MS, [place], LDN Office: [phone] Pager: [phone] After Hours/Weekend Pager: [phone]

## 2019-01-26 NOTE — Progress Notes (Signed)
   Contacted this AM regarding existing order for outpatient stress test for [name].   Confirmed patient was permitted to eat today.  No plan to stress test today and until improvement in volume / breathing status with IV diuresis.   Diet ordered for patient.   Recommend reschedule outpatient stress test if not performed later this admission and once breathing status improved. Of note, stress test has been ordered for some time but not yet completed by patient, and he may benefit from completing this [place] before discharge if able.   Signed, Arvil Chaco, PA-C 01/26/2019, 9:39 AM Pager [phone]

## 2019-01-26 NOTE — Progress Notes (Signed)
Progress Note  Patient Name: [name] Date of Encounter: 01/26/2019  Primary Cardiologist: Dr. Mariah MillingGollan, [facility]  Subjective   Reports that he feels well this morning, no complaints Still with some cough but this is improving on doxycycline Less sputum production Significant urination this admission Weight has dropped down to 215 pounds,   REDS measurement down to 33,    Inpatient Medications    Scheduled Meds: . amiodarone  200 mg Oral Daily  . atorvastatin  80 mg Oral q1800  . benzonatate  200 mg Oral TID  . budesonide (PULMICORT) nebulizer solution  0.25 mg Nebulization BID  . carvedilol  6.25 mg Oral BID WC  . docusate sodium  100 mg Oral BID  . doxycycline  100 mg Oral Q12H  . furosemide  60 mg Intravenous BID  . ipratropium-albuterol  3 mL Nebulization BID  . isosorbide-hydrALAZINE  1 tablet Oral BID  . methocarbamol  500 mg Oral QID  . mometasone-formoterol  2 puff Inhalation BID  . oxyCODONE  5 mg Oral BID  . potassium chloride  20 mEq Oral BID  . Rivaroxaban  20 mg Oral Q supper  . sodium chloride flush  3 mL Intravenous Q12H  . spironolactone  25 mg Oral Daily  . traZODone  50 mg Oral QHS   Continuous Infusions:  PRN Meds: guaiFENesin-codeine, ipratropium-albuterol, ondansetron **OR** ondansetron (ZOFRAN) IV      Vital Signs    Vitals:   01/26/19 0421 01/26/19 0745 01/26/19 0823 01/26/19 0900  BP: 129/79  (!) 131/92   Pulse: 80  87   Resp: 18  18   Temp: 98 F (36.7 C)  (!) 97.5 F (36.4 C)   TempSrc:   Oral   SpO2: 100% 95% 100%   Weight:    97.8 kg  Height:        Intake/Output Summary (Last 24 hours) at 01/26/2019 1819 Last data filed at 01/26/2019 [id number] Gross per 24 hour  Intake 660 ml  Output 950 ml  Net -290 ml   Last 3 Weights 01/26/2019 01/26/2019 01/25/2019  Weight (lbs) 215 lb 10.4 oz 214 lb 11.2 oz 216 lb 12.8 oz  Weight (kg) 97.818 kg 97.387 kg 98.34 kg  Some encounter information is confidential and restricted. Go to  Review Flowsheets activity to see all data.      Telemetry    SR, 60s, - Personally Reviewed  ECG    No new tracings - Personally Reviewed  Physical Exam   Constitutional:  oriented to person, place, and time. No distress.  HENT:  Head: Grossly normal Eyes:  no discharge. No scleral icterus.  Neck: No JVD, no carotid bruits  Cardiovascular: Regular rate and rhythm, no murmurs appreciated Pulmonary/Chest: Clear to auscultation bilaterally, no wheezes or rails Abdominal: Soft.  no distension.  no tenderness.  Musculoskeletal: Normal range of motion Neurological:  normal muscle tone. Coordination normal. No atrophy Skin: Skin warm and dry Psychiatric: normal affect, pleasant   Labs    High Sensitivity Troponin:   Recent Labs  Lab 01/22/19 0118 01/22/19 0300  TROPONINIHS 126* 122*      Cardiac EnzymesNo results for input(s): TROPONINI in the last 168 hours. No results for input(s): TROPIPOC in the last 168 hours.   Chemistry Recent Labs  Lab 01/22/19 0118  01/24/19 0511 01/25/19 0256 01/26/19 0330  NA 142   < > 138 136 137  K 4.0   < > 3.6 4.0 4.6  CL 105   < >  104 101 102  CO2 25   < > 27 25 26   GLUCOSE 133*   < > 144* 132* 126*  BUN 25*   < > 30* 32* 36*  CREATININE 1.64*   < > 1.66* 1.69* 1.76*  CALCIUM 9.5   < > 8.7* 9.0 9.6  PROT 8.6*  --   --   --   --   ALBUMIN 3.9  --   --   --   --   AST 43*  --   --   --   --   ALT 33  --   --   --   --   ALKPHOS 292*  --   --   --   --   BILITOT 2.1*  --   --   --   --   GFRNONAA 44*   < > 44* 43* 41*  GFRAA 51*   < > 50* 49* 47*  ANIONGAP 12   < > 7 10 9    < > = values in this interval not displayed.     Hematology Recent Labs  Lab 01/24/19 0511 01/25/19 0256 01/26/19 0330  WBC 8.0 7.5 6.1  RBC 3.80* 3.94* 4.36  HGB 11.2* 11.8* 12.8*  HCT 37.3* 38.7* 41.8  MCV 98.2 98.2 95.9  MCH 29.5 29.9 29.4  MCHC 30.0 30.5 30.6  RDW 16.6* 16.8* 16.6*  PLT 152 180 212    BNP Recent Labs  Lab 01/22/19  0118 01/25/19 0256  BNP 2,247.0* 782.0*     DDimer No results for input(s): DDIMER in the last 168 hours.   Radiology    Dg Chest 2 View  Result Date: 01/26/2019 CLINICAL DATA:  Shortness of breath and cough. EXAM: CHEST - 2 VIEW COMPARISON:  01/22/2019 FINDINGS: Lungs are adequately inflated with mild interval improvement and patchy hazy bilateral airspace opacity. No effusion. Borderline stable cardiomegaly. Remainder of the exam is unchanged. IMPRESSION: Slight interval improvement and minimal hazy bilateral patchy airspace opacification which may be due to improving edema or infection. Electronically Signed   By: Marin Olp M.D.   On: 01/26/2019 09:34    Cardiac Studies   TTE 06/14/2018 - Left ventricle: The cavity size was mildly dilated. There was   mild concentric hypertrophy. Systolic function was moderately to   severely reduced. The estimated ejection fraction was in the   range of 30% to 35%. Diffuse hypokinesis. - Mitral valve: There was mild regurgitation. - Left atrium: The atrium was mildly dilated. - Right atrium: The atrium was mildly dilated. - Pulmonary arteries: Systolic pressure could not be accurately   estimated. - Inferior vena cava: The vessel was dilated. The respirophasic   diameter changes were blunted (< 50%), consistent with elevated   central venous pressure.  Patient Profile     63 y.o. male with a history of CAD s/p prior stenting to the RCA and rPDA, HFrEF with EF of 30 to 35% by echo 05/2018, hypertension, hyperlipidemia, DM2, persistent atrial fibrillation/atrial flutter, and carotid arterial disease s/p recent stroke and 05/2018 with pending R CEA and who is being seen today for the evaluation of shortness of breath, abdominal swelling, and chest tightness.  Assessment & Plan    1.  Acute on chronic diastolic and systolic CHF Markedly elevated BNP, weight gain, Shortness of breath with abdominal bloating Reds measurement of 40.    Treated this admission with Lasix IV twice daily Saturday through today, 5 days with dramatic weight loss urine  output improvement of his symptoms Reds measurement down to 33 Dramatic improvement in his symptoms CHF education provided, Will continue torsemide 40 twice daily with close monitoring as an outpatient  2.  Afib RVR:   Maintaining normal sinus rhythm On beta-blocker , amiodarone  3.  Elevated troponin:  Demand ischemia in the setting of heart failure Non-trending high-sensitivity troponin No further testing at this time  4.  Essential HTN/Hypotension:   Blood pressure improved with aggressive diuresis, continue current medications  5.  HL:  LDL 79 in Dec.   Continue statin  7.  DMII:   Per IM. Diet discussed with him, recommended low carbohydrate diet  8.  Carotid arterial dzs:  s/p stroke in Dec w/ 80% right carotid stenosis @ the bifurcation.  Outpatient follow-up with periodic arterial ultrasound   Bronchitis, cough 2 weeks of cough, with sputum  Treated with antibiotics with improvement of symptoms  Long discussion with congestive heart failure team, hospitalist service Present for REDS measurement at the bedside Discussion of plan with hospitalist team and nursing   Total encounter time more than 35 minutes  Greater than 50% was spent in counseling and coordination of care with the patient   For questions or updates, please contact [facility] Please consult [email or web address] for contact info under        Signed, Julien Nordmannimothy Delesha Pohlman, MD  01/26/2019, 6:19 PM

## 2019-02-02 ENCOUNTER — Ambulatory Visit: Payer: Non-veteran care | Admitting: Family

## 2019-02-02 NOTE — Progress Notes (Deleted)
Patient ID: Jonell CluckSamuel Daughtrey, male    DOB: 23-Mar-1956, 63 y.o.   MRN: [id number]  HPI  [name] is a 63 y/o male with a history of CAD, DM, hyperlipidemia, HTN, CKD, stroke, COPD, gout, atrial fibrillation, previous tobacco use and chronic heart failure.   Echo report from 06/14/18 reviewed and showed an EF of 30-35% along with mild MR.  Catheterization done 07/28/16 and showed:  Prox RCA lesion, 0 %stenosed.  Mid RCA lesion, 10 %stenosed.  Mid Cx lesion, 0 %stenosed.  Mid Cx to Dist Cx lesion, 0 %stenosed.  Prox Cx lesion, 40 %stenosed.  Prox LAD lesion, 40 %stenosed.  Mid LAD lesion, 40 %stenosed.  Dist LAD lesion, 30 %stenosed.  LV end diastolic pressure is severely elevated. 1. Patent stents in the right coronary artery and left circumflex with no significant restenosis. Moderate LAD disease.  2. Right heart catheterization showed severely elevated filling pressures and severe pulmonary hypertension. RV pressure was 82/18, PDA 81/43 with a mean of 57. RA pressure was 11. Left ventricular end-diastolic pressure was 34 mmHg. Pulmonary capillary wedge pressure could not be obtained due to inability to advance the Swan-Ganz catheter. Cardiac output is 4.44 with a cardiac index of 1.94.  Admitted 01/22/2019 due to acute on chronic heart failure. Cardiology consult obtained. IV lasix given initially and then transitioned to oral diuretics. Discharged after 4 days. Admitted 08/18/2018 due to sepsis due to influenza. Treated with tamiflu and discharged after 2 days. Admitted 07/25/2018 due to sepsis due to UTI. Nephrology and cardiology consults were obtained. Given antibiotics. Given IV lasix with resultant >3L fluid loss and then transitioned to oral diuretics. Needed IV fluids due to acute kidney injury. Discharged after 6 days.   He presents today for a follow-up visit with a chief complaint of   Past Medical History:  Diagnosis Date  . CAD in native artery    a. stress echo 12/2007  abnl, EF > 55%, b. LHC 01/28/08: mLAD 30, D1 40, dLCx 70, pRCA 30, mRCA 70, mRCA lesion 2 80, PDA 90, s/p PCI/BMS to prox and distal RCA, s/p PCI/BMS to PDA; c. patient [facility] ([facility] on [facility]) d. 06/2016: cath showing patent stents along RCA and LCx with moderate 40% stenosis along the LAD.   [name]. Carotid arterial disease (HCC)    a. 05/2018 CTA Head/neck: 80-90% stenosis @ L carotid bifurcation, extending into the prox LICA. 60% stenosis @ R carotid bifurcation. 50-75% bilateral distal cavernous carotid dzs.  . Chronic combined systolic (congestive) and diastolic (congestive) heart failure (HCC)    a. 2009 Echo: > 55%; b. 06/2016: EF 25-30%; c. 12/2017 Echo: EF 30-35%, diff HK; d. 05/2018 Echo: 30-35%, mild conc LVH, diff HK. Mild MR. Mildly dil LA/RA.  [name]. Chronic kidney disease   . COPD (chronic obstructive pulmonary disease) (HCC)   . Diabetes mellitus without complication (HCC)   . Gout   . Hyperlipidemia   . Hypertension   . Hypertensive heart disease   . Ischemic cardiomyopathy    a. 05/2018: 30-35%.  . Obesity   . Persistent atrial fibrillation 01/2017   a. cardioverted 9/18 to NSR; b. 12/2017 noted to be back in Afib-outpt dccv rec; c. CHA2DS2VASc = 6-->Xarelto.  . Stroke (cerebrum) (HCC)    a. 05/2018 MRI: small patchy acute to subacute cortial and white matter infarct in the bilat cerebrum. Mild chronic small vessel ischmia; b. 05/2018 Carotid U/S: L-carotid bifurcation 80-90% stenosed,  R- carotid bifurcation 60% stenosed.  . Tobacco abuse    Past Surgical History:  Procedure Laterality Date  . CARDIAC CATHETERIZATION  2009   [facility];   . CARDIAC CATHETERIZATION  2010  . CARDIAC CATHETERIZATION N/A 07/28/2016   Procedure: Right and Left Heart Cath and possible PCI;  Surgeon: Iran OuchMuhammad A Arida, MD;  Location: [facility] INVASIVE CV LAB;  Service: Cardiovascular;  Laterality: N/A;  . CARDIOVERSION N/A 03/27/2017   Procedure: CARDIOVERSION;  Surgeon:  Iran OuchArida, Muhammad A, MD;  Location: [facility] ORS;  Service: Cardiovascular;  Laterality: N/A;  . CARDIOVERSION N/A 07/30/2018   Procedure: CARDIOVERSION;  Surgeon: Antonieta IbaGollan, Timothy J, MD;  Location: [facility] ORS;  Service: Cardiovascular;  Laterality: N/A;  . CORONARY ANGIOPLASTY  2009   [facility].   Family History  Problem Relation Age of Onset  . Heart attack Mother 3655  . Hypertension Mother   . Cancer Brother    Social History   Tobacco Use  . Smoking status: Former Smoker    Packs/day: 1.00    Years: 35.00    Pack years: 35.00    Types: Cigarettes  . Smokeless tobacco: Never Used  Substance Use Topics  . Alcohol use: Not Currently    Comment: Quit 8 yrs.  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 Aspirin Other (See Comments)    Told not to take because of blood thinner  . Ibuprofen Nausea And Vomiting  . Tylenol [Acetaminophen] Nausea And Vomiting     Review of Systems  Constitutional: Positive for fatigue. Negative for appetite change.  HENT: Positive for rhinorrhea. Negative for congestion and sore throat.   Eyes: Negative.   Respiratory: Positive for cough and shortness of breath (with minimal exertion). Negative for chest tightness.   Cardiovascular: Negative for chest pain, palpitations and leg swelling.  Gastrointestinal: Negative for abdominal distention and abdominal pain.  Endocrine: Negative.   Genitourinary: Negative.   Musculoskeletal: Positive for back pain. Negative for neck pain.  Skin: Negative.   Allergic/Immunologic: Negative.   Neurological: Negative for dizziness and light-headedness.  Hematological: Negative for adenopathy. Does not bruise/bleed easily.  Psychiatric/Behavioral: Negative for dysphoric mood and sleep disturbance (sleeping on 2 pillows; wearing CPAP). The patient is not nervous/anxious.       Physical Exam Vitals signs and nursing note reviewed.  Constitutional:      Appearance: He is well-developed.   HENT:     Head: Normocephalic and atraumatic.  Neck:     Musculoskeletal: Normal range of motion and neck supple.     Vascular: No JVD.  Cardiovascular:     Rate and Rhythm: Normal rate and regular rhythm.  Pulmonary:     Effort: Pulmonary effort is normal. No respiratory distress.     Breath sounds: No wheezing or rales.  Abdominal:     Palpations: Abdomen is soft.     Tenderness: There is no abdominal tenderness.  Musculoskeletal:     Right lower leg: He exhibits no tenderness. No edema.     Left lower leg: He exhibits no tenderness. No edema.  Skin:    General: Skin is warm and dry.  Neurological:     General: No focal deficit present.     Mental Status: He is alert and oriented to person, place, and time.  Psychiatric:        Mood and Affect: Mood normal.        Behavior: Behavior normal.    Assessment & Plan:  1: Chronic heart failure with reduced ejection fraction- - NYHA class III - euvolemic today - weighing daily and he was reminded to call for an overnight weight gain of >2 pounds or a weekly weight gain of >5 pounds - weight 210.4 from last visit 5 months ago - not adding salt and his wife has been reading food labels. Reviewed the importance of closely following a 2000mg  sodium diet  - consider entresto if able with renal function - could consider titrating up carvedilol although he has been bradycardic in the past - had telemedicine visit with cardiology ([facility]) 10/12/2018 - BNP 01/25/2019 was 782.0  2: HTN- - BP  - goes to [facility] for [facility] care - BMP from 01/26/2019 reviewed and showed sodium 137, potassium 4.6, creatinine 1.76 and GFR 49  3: DM- - A1c 08/18/2018 was 8.8%  4: Atrial fibrillation- - cardioverted 07/30/2018 - on amiodarone as well as xarelto  Medication list was reviewed.

## 2019-02-07 NOTE — Progress Notes (Signed)
Patient ID: [name], male    DOB: [birth year], 63 y.o.   MRN: [id number]  HPI  Mr [name] is a 63 y/o male with a history of CAD, DM, hyperlipidemia, HTN, CKD, stroke, COPD, gout, atrial fibrillation, previous tobacco use and chronic heart failure.   Echo report from 06/14/18 reviewed and showed an EF of 30-35% along with mild MR.  Catheterization done 07/28/16 and showed:  Prox RCA lesion, 0 %stenosed.  Mid RCA lesion, 10 %stenosed.  Mid Cx lesion, 0 %stenosed.  Mid Cx to Dist Cx lesion, 0 %stenosed.  Prox Cx lesion, 40 %stenosed.  Prox LAD lesion, 40 %stenosed.  Mid LAD lesion, 40 %stenosed.  Dist LAD lesion, 30 %stenosed.  LV end diastolic pressure is severely elevated. 1. Patent stents in the right coronary artery and left circumflex with no significant restenosis. Moderate LAD disease.  2. Right heart catheterization showed severely elevated filling pressures and severe pulmonary hypertension. RV pressure was 82/18, PDA 81/43 with a mean of 57. RA pressure was 11. Left ventricular end-diastolic pressure was 34 mmHg. Pulmonary capillary wedge pressure could not be obtained due to inability to advance the Swan-Ganz catheter. Cardiac output is 4.44 with a cardiac index of 1.94.  Admitted 01/22/2019 due to acute on chronic heart failure. Cardiology consult obtained. IV lasix given initially and then transitioned to oral diuretics. Discharged after 4 days. Admitted 08/18/2018 due to sepsis due to influenza. Treated with tamiflu and discharged after 2 days. Admitted 07/25/2018 due to sepsis due to UTI. Nephrology and cardiology consults were obtained. Given antibiotics. Given IV lasix with resultant >3L fluid loss and then transitioned to oral diuretics. Needed IV fluids due to acute kidney injury. Discharged after 6 days.   He presents today for a follow-up visit with a chief complaint of moderate shortness of breath upon minimal exertion. He describes this as chronic in nature  having been present for several years. He has associated fatigue, cough, back pain and left knee swelling/ pain. He denies any difficulty sleeping, dizziness, abdominal distention, palpitations, pedal edema or chest pain. He hasn't been weighing himself lately because with the left knee swelling/pain, he's unable to stand for long periods of time.   Past Medical History:  Diagnosis Date  . CAD in native artery    a. stress echo 12/2007 abnl, EF > 55%, b. LHC 01/28/08: mLAD 30, D1 40, dLCx 70, pRCA 30, mRCA 70, mRCA lesion 2 80, PDA 90, s/p PCI/BMS to prox and distal RCA, s/p PCI/BMS to PDA; c. patient reports PCI/stenting x 2 in early 2017 at the [facility] (no records on file) d. 06/2016: cath showing patent stents along RCA and LCx with moderate 40% stenosis along the LAD.   [name]. Carotid arterial disease (HCC)    a. 05/2018 CTA Head/neck: 80-90% stenosis @ L carotid bifurcation, extending into the prox LICA. 60% stenosis @ R carotid bifurcation. 50-75% bilateral distal cavernous carotid dzs.  . Chronic combined systolic (congestive) and diastolic (congestive) heart failure (HCC)    a. 2009 Echo: > 55%; b. 06/2016: EF 25-30%; c. 12/2017 Echo: EF 30-35%, diff HK; d. 05/2018 Echo: 30-35%, mild conc LVH, diff HK. Mild MR. Mildly dil LA/RA.  [name]. Chronic kidney disease   . COPD (chronic obstructive pulmonary disease) (HCC)   . Diabetes mellitus without complication (HCC)   . Gout   . Hyperlipidemia   . Hypertension   . Hypertensive heart disease   . Ischemic cardiomyopathy    a. 05/2018: 30-35%.  .[name]  Obesity   . Persistent atrial fibrillation 01/2017   a. cardioverted 9/18 to NSR; b. 12/2017 noted to be back in Afib-outpt dccv rec; c. CHA2DS2VASc = 6-->Xarelto.  . Stroke (cerebrum) (HCC)    a. 05/2018 MRI: small patchy acute to subacute cortial and white matter infarct in the bilat cerebrum. Mild chronic small vessel ischmia; b. 05/2018 Carotid U/S: L-carotid bifurcation 80-90% stenosed, R- carotid bifurcation 60%  stenosed.  . Tobacco abuse    Past Surgical History:  Procedure Laterality Date  . CARDIAC CATHETERIZATION  2009   [facility];   . CARDIAC CATHETERIZATION  2010  . CARDIAC CATHETERIZATION N/A 07/28/2016   Procedure: Right and Left Heart Cath and possible PCI;  Surgeon: Iran OuchMuhammad A Arida, MD;  Location: [facility]IVE [facility];  Service: Cardiovascular;  Laterality: N/A;  . CARDIOVERSION N/A 03/27/2017   Procedure: CARDIOVERSION;  Surgeon: Iran OuchArida, Muhammad A, MD;  Location: [facility] ORS;  Service: Cardiovascular;  Laterality: N/A;  . CARDIOVERSION N/A 07/30/2018   Procedure: CARDIOVERSION;  Surgeon: Antonieta IbaGollan, Timothy J, MD;  Location: [facility] ORS;  Service: Cardiovascular;  Laterality: N/A;  . CORONARY ANGIOPLASTY  2009   [facility]   Family History  Problem Relation Age of Onset  . Heart attack Mother 2655  . Hypertension Mother   . Cancer Brother    Social History   Tobacco Use  . Smoking status: Former Smoker    Packs/day: 1.00    Years: 35.00    Pack years: 35.00    Types: Cigarettes  . Smokeless tobacco: Never Used  Substance Use Topics  . Alcohol use: Not Currently    Comment: Quit 8 yrs.  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 Aspirin Other (See Comments)    Told not to take because of blood thinner  . Ibuprofen Nausea And Vomiting  . Tylenol [Acetaminophen] Nausea And Vomiting   Prior to Admission medications   Medication Sig Start Date End Date Taking? Authorizing Provider  albuterol-ipratropium (COMBIVENT) 18-103 MCG/ACT inhaler Inhale 2 puffs into the lungs every 4 (four) hours as needed for wheezing.   Yes [provider]  atorvastatin (LIPITOR) 80 MG tablet Take 80 mg by mouth daily.   Yes [provider]  budesonide-formoterol (SYMBICORT) 160-4.5 MCG/ACT inhaler Inhale 2 puffs into the lungs 2 (two) times daily.   Yes [provider]  carvedilol (COREG) 6.25 MG tablet Take 1 tablet (6.25 mg total) by mouth 2 (two)  times daily with a meal. 07/31/18  Yes Altamese DillingVachhani, Vaibhavkumar, MD  docusate sodium (COLACE) 100 MG capsule Take 1 capsule (100 mg total) by mouth 2 (two) times daily. 09/02/18  Yes ,  A, FNP  isosorbide-hydrALAZINE (BIDIL) 20-37.5 MG tablet Take 1 tablet by mouth 2 (two) times daily.    Yes [provider]  magnesium oxide (MAG-OX) 400 MG tablet Take 400 mg by mouth 2 (two) times daily.   Yes [provider]  methocarbamol (ROBAXIN) 500 MG tablet Take 500 mg by mouth 4 (four) times daily.   Yes [provider]  nitroGLYCERIN (NITROSTAT) 0.4 MG SL tablet Place 0.4 mg under the tongue every 5 (five) minutes as needed for chest pain.   Yes [provider]  oxyCODONE (OXY IR/ROXICODONE) 5 MG immediate release tablet Take 5 mg by mouth 2 (two) times a day. 01/14/19  Yes [provider]  potassium chloride SA (K-DUR,KLOR-CON) 20 MEQ tablet Take 20 mEq by mouth daily.   Yes [provider]  rivaroxaban (XARELTO) 15 MG TABS tablet Take 1 tablet (15 mg total) by mouth daily with supper. Patient taking differently: Take 20 mg by mouth daily with supper.  08/20/18  Yes Enedina FinnerPatel, Sona, MD  spironolactone (ALDACTONE) 25 MG tablet Take 1 tablet (25 mg total) by mouth daily. 09/09/18  Yes Dunn, Raymon Muttonyan M, PA-C  torsemide (DEMADEX) 20 MG tablet Take 2 tablets (40 mg total) by mouth 2 (two) times daily. 01/26/19 02/25/19 Yes Pyreddy, Vivien RotaPavan, MD  traZODone (DESYREL) 50 MG tablet Take 50 mg by mouth at bedtime.   Yes [provider]     Review of Systems  Constitutional: Positive for fatigue. Negative for appetite change.  HENT: Positive for rhinorrhea. Negative for congestion and sore throat.   Eyes: Negative.   Respiratory: Positive for cough and shortness of breath (with minimal exertion). Negative for chest tightness.   Cardiovascular: Negative for chest pain, palpitations and leg swelling.  Gastrointestinal: Negative for abdominal distention and  abdominal pain.  Endocrine: Negative.   Genitourinary: Negative.   Musculoskeletal: Positive for arthralgias (left knee with swelling) and back pain. Negative for neck pain.  Skin: Negative.   Allergic/Immunologic: Negative.   Neurological: Negative for dizziness and light-headedness.  Hematological: Negative for adenopathy. Does not bruise/bleed easily.  Psychiatric/Behavioral: Negative for dysphoric mood and sleep disturbance (sleeping on 2 pillows; wearing CPAP at times). The patient is not nervous/anxious.    Vitals:   02/08/19 1245  BP: (!) 142/93  Pulse: 82  Resp: 18  SpO2: 100%  Height: 5\' 11"  (1.803 m)   Wt Readings from Last 3 Encounters:  01/26/19 215 lb 10.4 oz (97.8 kg)  11/19/18 210 lb (95.3 kg)  10/13/18 214 lb (97.1 kg)   Lab Results  Component Value Date   CREATININE 1.76 (H) 01/26/2019   CREATININE 1.69 (H) 01/25/2019   CREATININE 1.66 (H) 01/24/2019    Physical Exam Vitals signs and nursing note reviewed.  Constitutional:      Appearance: He is well-developed.  HENT:     Head: Normocephalic and atraumatic.  Neck:     Musculoskeletal: Normal range of motion and neck supple.     Vascular: No JVD.  Cardiovascular:     Rate and Rhythm: Normal rate and regular rhythm.  Pulmonary:     Effort: Pulmonary effort is normal. No respiratory distress.     Breath sounds: No wheezing or rales.  Abdominal:     Palpations: Abdomen is soft.     Tenderness: There is no abdominal tenderness.  Musculoskeletal:        General: Swelling (left knee) present.     Right lower leg: He exhibits no tenderness. No edema.     Left lower leg: He exhibits no tenderness. No edema.  Skin:    General: Skin is warm and dry.  Neurological:     General: No focal deficit present.     Mental Status: He is alert and oriented to person, place, and time.  Psychiatric:        Mood and Affect: Mood normal.        Behavior: Behavior normal.    Assessment & Plan:   1: Chronic heart  failure with reduced ejection fraction- - NYHA class III - euvolemic today - not weighing daily due to left knee pain/ swelling; encouraged him to resume when he safely can and to call for an overnight weight gain of >2 pounds or a weekly weight gain of >5 pounds - patient unable to  stand today to be weighed - not adding salt and his wife has been reading food labels. Reminded to closely follow a 2000mg  sodium diet  - will add entresto 24/26mg  BID; 30 day coupon given to patient and will apply to [employer] as patient says that he has to pay out of pocket for everything if it's not done through the [place] - will ask cardiology to check a BMP in 2 weeks at his visit with them - could consider titrating up carvedilol although he has been bradycardic in the past - had telemedicine visit with cardiology (Dunn) 10/12/2018 - BNP 01/25/2019 was 782.0 - cardioverted 07/30/2018  2: HTN- - BP mildly elevated today but he hasn't taken his medications yet today - goes to [facility] for [facility] care and just went there yesterday; he's unsure of what they are going to do about his knee - BMP from 01/26/2019 reviewed and showed sodium 137, potassium 4.6, creatinine 1.76 and GFR 49  3: DM- - A1c 08/18/2018 was 8.8%   Medication list was reviewed.  Since he's seeing cardiology in 2 weeks, will have him return here in 1 month or sooner for any questions/problems before then.

## 2019-02-08 ENCOUNTER — Ambulatory Visit: Payer: No Typology Code available for payment source | Attending: Family | Admitting: Family

## 2019-02-08 ENCOUNTER — Other Ambulatory Visit: Payer: Self-pay

## 2019-02-08 ENCOUNTER — Encounter: Payer: Self-pay | Admitting: Family

## 2019-02-08 VITALS — BP 142/93 | HR 82 | Resp 18 | Ht 71.0 in

## 2019-02-08 DIAGNOSIS — E1122 Type 2 diabetes mellitus with diabetic chronic kidney disease: Secondary | ICD-10-CM | POA: Diagnosis not present

## 2019-02-08 DIAGNOSIS — M25462 Effusion, left knee: Secondary | ICD-10-CM | POA: Diagnosis not present

## 2019-02-08 DIAGNOSIS — Z87891 Personal history of nicotine dependence: Secondary | ICD-10-CM | POA: Diagnosis not present

## 2019-02-08 DIAGNOSIS — Z79899 Other long term (current) drug therapy: Secondary | ICD-10-CM | POA: Diagnosis not present

## 2019-02-08 DIAGNOSIS — Z8673 Personal history of transient ischemic attack (TIA), and cerebral infarction without residual deficits: Secondary | ICD-10-CM | POA: Insufficient documentation

## 2019-02-08 DIAGNOSIS — I4891 Unspecified atrial fibrillation: Secondary | ICD-10-CM | POA: Insufficient documentation

## 2019-02-08 DIAGNOSIS — R0602 Shortness of breath: Secondary | ICD-10-CM | POA: Diagnosis present

## 2019-02-08 DIAGNOSIS — Z7951 Long term (current) use of inhaled steroids: Secondary | ICD-10-CM | POA: Diagnosis not present

## 2019-02-08 DIAGNOSIS — M109 Gout, unspecified: Secondary | ICD-10-CM | POA: Diagnosis not present

## 2019-02-08 DIAGNOSIS — I10 Essential (primary) hypertension: Secondary | ICD-10-CM

## 2019-02-08 DIAGNOSIS — I5022 Chronic systolic (congestive) heart failure: Secondary | ICD-10-CM

## 2019-02-08 DIAGNOSIS — I255 Ischemic cardiomyopathy: Secondary | ICD-10-CM | POA: Diagnosis not present

## 2019-02-08 DIAGNOSIS — Z7901 Long term (current) use of anticoagulants: Secondary | ICD-10-CM | POA: Insufficient documentation

## 2019-02-08 DIAGNOSIS — I2510 Atherosclerotic heart disease of native coronary artery without angina pectoris: Secondary | ICD-10-CM | POA: Insufficient documentation

## 2019-02-08 DIAGNOSIS — M549 Dorsalgia, unspecified: Secondary | ICD-10-CM | POA: Insufficient documentation

## 2019-02-08 DIAGNOSIS — Z955 Presence of coronary angioplasty implant and graft: Secondary | ICD-10-CM | POA: Insufficient documentation

## 2019-02-08 DIAGNOSIS — I5042 Chronic combined systolic (congestive) and diastolic (congestive) heart failure: Secondary | ICD-10-CM | POA: Diagnosis not present

## 2019-02-08 DIAGNOSIS — I130 Hypertensive heart and chronic kidney disease with heart failure and stage 1 through stage 4 chronic kidney disease, or unspecified chronic kidney disease: Secondary | ICD-10-CM | POA: Diagnosis not present

## 2019-02-08 DIAGNOSIS — I272 Pulmonary hypertension, unspecified: Secondary | ICD-10-CM | POA: Diagnosis not present

## 2019-02-08 DIAGNOSIS — N183 Chronic kidney disease, stage 3 unspecified: Secondary | ICD-10-CM

## 2019-02-08 DIAGNOSIS — N189 Chronic kidney disease, unspecified: Secondary | ICD-10-CM | POA: Insufficient documentation

## 2019-02-08 DIAGNOSIS — Z8249 Family history of ischemic heart disease and other diseases of the circulatory system: Secondary | ICD-10-CM | POA: Diagnosis not present

## 2019-02-08 DIAGNOSIS — J449 Chronic obstructive pulmonary disease, unspecified: Secondary | ICD-10-CM | POA: Insufficient documentation

## 2019-02-08 DIAGNOSIS — Z8774 Personal history of (corrected) congenital malformations of heart and circulatory system: Secondary | ICD-10-CM | POA: Diagnosis not present

## 2019-02-08 DIAGNOSIS — E785 Hyperlipidemia, unspecified: Secondary | ICD-10-CM | POA: Diagnosis not present

## 2019-02-08 MED ORDER — SACUBITRIL-VALSARTAN 24-26 MG PO TABS: 1.0000 | ORAL_TABLET | Freq: Two times a day (BID) | ORAL | 3 refills | Status: DC

## 2019-02-08 NOTE — Patient Instructions (Addendum)
Continue weighing daily and call for an overnight weight gain of > 2 pounds or a weekly weight gain of >5 pounds.  Begin entresto by taking 1 tablet twice daily

## 2019-02-09 ENCOUNTER — Encounter: Payer: Self-pay | Admitting: Family

## 2019-02-22 NOTE — Progress Notes (Deleted)
Follow-up Outpatient Visit Date: 02/23/2019  Primary Care Provider: Administration, [place] [address]  Chief Complaint: ***  HPI:  [name] is a 63 y.o. year-old male with history of CAD status post prior stenting to the RCA and rPDA, HFrEF with an EF of 30 to 35% by echo in 05/2018 due to ICM, persistent A. fib/flutter on Xarelto, carotid artery disease status post recent stroke in 05/2018 pending right sided CEA, CKD stage II, COPD secondary to tobacco abuse, diabetes, hypertension, hyperlipidemia, and obesity, who presents for follow-up of CAD, HFrEF, and atrial fibrillation.  He was hospitalized last month with acute on chronic systolic and diastolic heart failure requiring aggressive diuresis.  He was seen by Darylene Price, NP, on 02/08/2019, at which time [facility] 24-26 was added.  --------------------------------------------------------------------------------------------------  Past Medical History:  Diagnosis Date  . CAD in native artery    a. stress echo 12/2007 abnl, EF > 55%, b. LHC 01/28/08: mLAD 30, D1 40, dLCx 86, pRCA 30, mRCA 70, mRCA lesion 2 80, PDA 90, s/p PCI/BMS to prox and distal RCA, s/p PCI/BMS to PDA; c. patient reports PCI/stenting x 2 in early 2017 at the [place] (no records on file) d. 06/2016: cath showing patent stents along RCA and LCx with moderate 40% stenosis along the LAD.   [name] Carotid arterial disease ([place])    a. 05/2018 CTA Head/neck: 80-90% stenosis @ L carotid bifurcation, extending into the prox LICA. 60% stenosis @ R carotid bifurcation. 50-75% bilateral distal cavernous carotid dzs.  . Chronic combined systolic (congestive) and diastolic (congestive) heart failure ([place])    a. 2009 Echo: > 55%; b. 06/2016: EF 25-30%; c. 12/2017 Echo: EF 30-35%, diff HK; d. 05/2018 Echo: 30-35%, mild conc LVH, diff HK. Mild MR. Mildly dil LA/RA.  [name] Chronic kidney disease   . COPD (chronic obstructive pulmonary disease) ([place])   . Diabetes mellitus  without complication ([place])   . Gout   . Hyperlipidemia   . Hypertension   . Hypertensive heart disease   . Ischemic cardiomyopathy    a. 05/2018: 30-35%.  . Obesity   . Persistent atrial fibrillation 01/2017   a. cardioverted 9/18 to NSR; b. 12/2017 noted to be back in Afib-outpt dccv rec; c. CHA2DS2VASc = 6-->Xarelto.  . Stroke (cerebrum) ([place])    a. 05/2018 MRI: small patchy acute to subacute cortial and white matter infarct in the bilat cerebrum. Mild chronic small vessel ischmia; b. 05/2018 Carotid U/S: L-carotid bifurcation 80-90% stenosed, R- carotid bifurcation 60% stenosed.  . Tobacco abuse    Past Surgical History:  Procedure Laterality Date  . CARDIAC CATHETERIZATION  2009   [facility];   . CARDIAC CATHETERIZATION  2010  . CARDIAC CATHETERIZATION N/A 07/28/2016   Procedure: Right and Left Heart Cath and possible PCI;  Surgeon: Wellington Hampshire, MD;  Location: [facility];  Service: Cardiovascular;  Laterality: N/A;  . CARDIOVERSION N/A 03/27/2017   Procedure: CARDIOVERSION;  Surgeon: Wellington Hampshire, MD;  Location: [facility] ORS;  Service: Cardiovascular;  Laterality: N/A;  . CARDIOVERSION N/A 07/30/2018   Procedure: CARDIOVERSION;  Surgeon: Minna Merritts, MD;  Location: [facility] ORS;  Service: Cardiovascular;  Laterality: N/A;  . CORONARY ANGIOPLASTY  2009   s/p stent placement at [facility].    No outpatient medications have been marked as taking for the 02/23/19 encounter (Appointment) with Tuyen Uncapher, Harrell Gave, MD.    Allergies: Aspirin, Ibuprofen, and Tylenol [acetaminophen]  Social History   Tobacco Use  .  Smoking status: Former Smoker    Packs/day: 1.00    Years: 35.00    Pack years: 35.00    Types: Cigarettes  . Smokeless tobacco: Never Used  Substance Use Topics  . Alcohol use: Not Currently    Comment: Quit 8 yrs.  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 Drug use: No    Family History  Problem Relation Age of Onset  . Heart attack Mother 3955  . Hypertension Mother   . Cancer  Brother     Review of Systems: A 12-system review of systems was performed and was negative except as noted in the HPI.  --------------------------------------------------------------------------------------------------  Physical Exam: There were no vitals taken for this visit.  General:  *** HEENT: No conjunctival pallor or scleral icterus. Moist mucous membranes.  OP clear. Neck: Supple without lymphadenopathy, thyromegaly, JVD, or HJR. No carotid bruit. Lungs: Normal work of breathing. Clear to auscultation bilaterally without wheezes or crackles. Heart: Regular rate and rhythm without murmurs, rubs, or gallops. Non-displaced PMI. Abd: Bowel sounds present. Soft, NT/ND without hepatosplenomegaly Ext: No lower extremity edema. Radial, PT, and DP pulses are 2+ bilaterally. Skin: Warm and dry without rash.  EKG:  ***  Lab Results  Component Value Date   WBC 6.1 01/26/2019   HGB 12.8 (L) 01/26/2019   HCT 41.8 01/26/2019   MCV 95.9 01/26/2019   PLT 212 01/26/2019    Lab Results  Component Value Date   NA 137 01/26/2019   K 4.6 01/26/2019   CL 102 01/26/2019   CO2 26 01/26/2019   BUN 36 (H) 01/26/2019   CREATININE 1.76 (H) 01/26/2019   GLUCOSE 126 (H) 01/26/2019   ALT 33 01/22/2019    Lab Results  Component Value Date   CHOL 139 06/14/2018   HDL 33 (L) 06/14/2018   LDLCALC 79 06/14/2018   TRIG 135 06/14/2018   CHOLHDL 4.2 06/14/2018    --------------------------------------------------------------------------------------------------  ASSESSMENT AND PLAN: Cristal Deer***  Ethan Kasperski, MD 02/22/2019 9:23 PM

## 2019-02-23 ENCOUNTER — Other Ambulatory Visit: Payer: Self-pay | Admitting: Family

## 2019-02-23 ENCOUNTER — Ambulatory Visit: Payer: Self-pay | Admitting: Internal Medicine

## 2019-02-23 DIAGNOSIS — I5022 Chronic systolic (congestive) heart failure: Secondary | ICD-10-CM

## 2019-02-28 ENCOUNTER — Inpatient Hospital Stay: Admission: RE | Admit: 2019-02-28 | Payer: No Typology Code available for payment source | Source: Ambulatory Visit

## 2019-03-04 ENCOUNTER — Emergency Department: Payer: No Typology Code available for payment source

## 2019-03-04 ENCOUNTER — Emergency Department
Admission: EM | Admit: 2019-03-04 | Discharge: 2019-03-04 | Disposition: A | Discharge status: Home / Self Care | Payer: No Typology Code available for payment source | Attending: Emergency Medicine | Admitting: Emergency Medicine

## 2019-03-04 ENCOUNTER — Encounter: Payer: Self-pay | Admitting: Intensive Care

## 2019-03-04 ENCOUNTER — Other Ambulatory Visit: Payer: Self-pay

## 2019-03-04 DIAGNOSIS — I5022 Chronic systolic (congestive) heart failure: Secondary | ICD-10-CM | POA: Insufficient documentation

## 2019-03-04 DIAGNOSIS — Z87891 Personal history of nicotine dependence: Secondary | ICD-10-CM | POA: Insufficient documentation

## 2019-03-04 DIAGNOSIS — R479 Unspecified speech disturbances: Secondary | ICD-10-CM | POA: Diagnosis not present

## 2019-03-04 DIAGNOSIS — I2510 Atherosclerotic heart disease of native coronary artery without angina pectoris: Secondary | ICD-10-CM | POA: Insufficient documentation

## 2019-03-04 DIAGNOSIS — R0600 Dyspnea, unspecified: Secondary | ICD-10-CM | POA: Insufficient documentation

## 2019-03-04 DIAGNOSIS — E119 Type 2 diabetes mellitus without complications: Secondary | ICD-10-CM | POA: Insufficient documentation

## 2019-03-04 DIAGNOSIS — I110 Hypertensive heart disease with heart failure: Secondary | ICD-10-CM | POA: Diagnosis not present

## 2019-03-04 DIAGNOSIS — R4781 Slurred speech: Secondary | ICD-10-CM | POA: Insufficient documentation

## 2019-03-04 DIAGNOSIS — R531 Weakness: Secondary | ICD-10-CM | POA: Insufficient documentation

## 2019-03-04 DIAGNOSIS — J449 Chronic obstructive pulmonary disease, unspecified: Secondary | ICD-10-CM | POA: Insufficient documentation

## 2019-03-04 DIAGNOSIS — R5383 Other fatigue: Secondary | ICD-10-CM | POA: Diagnosis present

## 2019-03-04 LAB — COMPREHENSIVE METABOLIC PANEL
ALT: 23 U/L (ref 0–44)
AST: 24 U/L (ref 15–41)
Albumin: 3.7 g/dL (ref 3.5–5.0)
Alkaline Phosphatase: 210 U/L — ABNORMAL HIGH (ref 38–126)
Anion gap: 12 (ref 5–15)
BUN: 24 mg/dL — ABNORMAL HIGH (ref 8–23)
CO2: 27 mmol/L (ref 22–32)
Calcium: 9.2 mg/dL (ref 8.9–10.3)
Chloride: 102 mmol/L (ref 98–111)
Creatinine, Ser: 1.39 mg/dL — ABNORMAL HIGH (ref 0.61–1.24)
GFR calc Af Amer: >60 mL/min (ref >60)
GFR calc non Af Amer: 54 mL/min — ABNORMAL LOW (ref >60)
Glucose, Bld: 166 mg/dL — ABNORMAL HIGH (ref 70–99)
Potassium: 3.3 mmol/L — ABNORMAL LOW (ref 3.5–5.1)
Sodium: 141 mmol/L (ref 135–145)
Total Bilirubin: 0.8 mg/dL (ref 0.3–1.2)
Total Protein: 7.4 g/dL (ref 6.5–8.1)

## 2019-03-04 LAB — URINALYSIS, COMPLETE (UACMP) WITH MICROSCOPIC
Bacteria, UA: NONE SEEN
Bilirubin Urine: NEGATIVE
Glucose, UA: NEGATIVE mg/dL
Hgb urine dipstick: NEGATIVE
Ketones, ur: NEGATIVE mg/dL
Leukocytes,Ua: NEGATIVE
Nitrite: NEGATIVE
Protein, ur: NEGATIVE mg/dL
Specific Gravity, Urine: 1.008 (ref 1.005–1.030)
pH: 6 (ref 5.0–8.0)

## 2019-03-04 LAB — CBC WITH DIFFERENTIAL/PLATELET
Abs Immature Granulocytes: 0.02 10*3/uL (ref 0.00–0.07)
Basophils Absolute: 0.1 10*3/uL (ref 0.0–0.1)
Basophils Relative: 1 %
Eosinophils Absolute: 0.2 10*3/uL (ref 0.0–0.5)
Eosinophils Relative: 2 %
HCT: 44.1 % (ref 39.0–52.0)
Hemoglobin: 13.6 g/dL (ref 13.0–17.0)
Immature Granulocytes: 0 %
Lymphocytes Relative: 18 %
Lymphs Abs: 1.3 10*3/uL (ref 0.7–4.0)
MCH: 28.5 pg (ref 26.0–34.0)
MCHC: 30.8 g/dL (ref 30.0–36.0)
MCV: 92.5 fL (ref 80.0–100.0)
Monocytes Absolute: 0.5 10*3/uL (ref 0.1–1.0)
Monocytes Relative: 8 %
Neutro Abs: 5.2 10*3/uL (ref 1.7–7.7)
Neutrophils Relative %: 71 %
Platelets: 131 10*3/uL — ABNORMAL LOW (ref 150–400)
RBC: 4.77 MIL/uL (ref 4.22–5.81)
RDW: 17.2 % — ABNORMAL HIGH (ref 11.5–15.5)
WBC: 7.2 10*3/uL (ref 4.0–10.5)
nRBC: 0 % (ref 0.0–0.2)

## 2019-03-04 LAB — URINE DRUG SCREEN, QUALITATIVE (ARMC ONLY)
Amphetamines, Ur Screen: NOT DETECTED
Barbiturates, Ur Screen: NOT DETECTED
Benzodiazepine, Ur Scrn: NOT DETECTED
Cannabinoid 50 Ng, Ur ~~LOC~~: NOT DETECTED
Cocaine Metabolite,Ur ~~LOC~~: NOT DETECTED
MDMA (Ecstasy)Ur Screen: NOT DETECTED
Methadone Scn, Ur: NOT DETECTED
Opiate, Ur Screen: NOT DETECTED
Phencyclidine (PCP) Ur S: NOT DETECTED
Tricyclic, Ur Screen: NOT DETECTED

## 2019-03-04 LAB — ETHANOL: Alcohol, Ethyl (B): <10 mg/dL (ref <10)

## 2019-03-04 LAB — TROPONIN I (HIGH SENSITIVITY): Troponin I (High Sensitivity): 65 ng/L — ABNORMAL HIGH (ref <18)

## 2019-03-04 NOTE — ED Notes (Signed)
Patient transported to CT 

## 2019-03-04 NOTE — ED Provider Notes (Signed)
[facility] Emergency Department Provider Note       Time seen: ----------------------------------------- 12:07 PM on 03/04/2019 -----------------------------------------   I have reviewed the triage vital signs and the nursing notes.  HISTORY   Chief Complaint No chief complaint on file.    HPI [name] is a 63 y.o. male with a history of coronary artery disease, heart failure, chronic kidney disease, COPD, hypertension, hyperlipidemia, atrial fibrillation, CVA who presents to the ED for lethargy, trouble breathing yesterday with slurred speech.  According to EMS the stroke screen was negative.  Patient is slow to respond to questions and the wife reports that is not normal.  Patient denies any complaints, unsure of time of onset for this.  Past Medical History:  Diagnosis Date  . CAD in native artery    a. stress echo 12/2007 abnl, EF > 55%, b. LHC 01/28/08: mLAD 30, D1 40, dLCx 70, pRCA 30, mRCA 70, mRCA lesion 2 80, PDA 90, s/p PCI/BMS to prox and distal RCA, s/p PCI/BMS to PDA; c. patient reports PCI/stenting x 2 in early 2017 at the [place] (no records on file) d. 06/2016: cath showing patent stents along RCA and LCx with moderate 40% stenosis along the LAD.   [name] Carotid arterial disease (HCC)    a. 05/2018 CTA Head/neck: 80-90% stenosis @ L carotid bifurcation, extending into the prox LICA. 60% stenosis @ R carotid bifurcation. 50-75% bilateral distal cavernous carotid dzs.  . Chronic combined systolic (congestive) and diastolic (congestive) heart failure (HCC)    a. 2009 Echo: > 55%; b. 06/2016: EF 25-30%; c. 12/2017 Echo: EF 30-35%, diff HK; d. 05/2018 Echo: 30-35%, mild conc LVH, diff HK. Mild MR. Mildly dil LA/RA.  [name] Chronic kidney disease   . COPD (chronic obstructive pulmonary disease) (HCC)   . Diabetes mellitus without complication (HCC)   . Gout   . Hyperlipidemia   . Hypertension   . Hypertensive heart disease   . Ischemic cardiomyopathy    a.  05/2018: 30-35%.  . Obesity   . Persistent atrial fibrillation 01/2017   a. cardioverted 9/18 to NSR; b. 12/2017 noted to be back in Afib-outpt dccv rec; c. CHA2DS2VASc = 6-->Xarelto.  . Stroke (cerebrum) (HCC)    a. 05/2018 MRI: small patchy acute to subacute cortial and white matter infarct in the bilat cerebrum. Mild chronic small vessel ischmia; b. 05/2018 Carotid U/S: L-carotid bifurcation 80-90% stenosed, R- carotid bifurcation 60% stenosed.  . Tobacco abuse     Patient Active Problem List   Diagnosis Date Noted  . Persistent atrial fibrillation   . CHF (congestive heart failure) (HCC) 01/23/2019  . Acute on chronic systolic CHF (congestive heart failure) (HCC) 01/22/2019  . HTN (hypertension) 09/02/2018  . DM (diabetes mellitus) (HCC) 09/02/2018  . SOB (shortness of breath) 08/18/2018  . HFrEF (heart failure with reduced ejection fraction) (HCC)   . Lactic acidosis 07/25/2018  . Hyperbilirubinemia 07/25/2018  . Carotid stenosis 06/13/2018  . Elevated troponin 06/13/2018  . Atrial fibrillation, chronic 06/13/2018  . TIA (transient ischemic attack) 06/13/2018  . TIA involving carotid artery 06/13/2018  . Atrial flutter with rapid ventricular response (HCC) 02/18/2017  . CAD in native artery   . Chest pain 01/05/2017  . Ischemic cardiomyopathy 07/26/2016  . Non-sustained ventricular tachycardia (HCC) 07/26/2016  . COPD (chronic obstructive pulmonary disease) (HCC) 07/24/2016  . CAD (coronary artery disease) 07/24/2016  . HLD (hyperlipidemia) 07/24/2016  . Tobacco abuse 07/24/2016  . Hypertensive heart disease 07/24/2016  Past Surgical History:  Procedure Laterality Date  . CARDIAC CATHETERIZATION  2009   [facility];   . CARDIAC CATHETERIZATION  2010  . CARDIAC CATHETERIZATION N/A 07/28/2016   Procedure: Right and Left Heart Cath and possible PCI;  Surgeon: Iran OuchMuhammad A Arida, MD;  Location: [facility] INVASIVE CV LAB;  Service: Cardiovascular;  Laterality: N/A;  . CARDIOVERSION N/A  03/27/2017   Procedure: CARDIOVERSION;  Surgeon: Iran OuchArida, Muhammad A, MD;  Location: [facility] ORS;  Service: Cardiovascular;  Laterality: N/A;  . CARDIOVERSION N/A 07/30/2018   Procedure: CARDIOVERSION;  Surgeon: Antonieta IbaGollan, Timothy J, MD;  Location: [facility] ORS;  Service: Cardiovascular;  Laterality: N/A;  . CORONARY ANGIOPLASTY  2009   [facility]    Allergies Aspirin, Ibuprofen, and Tylenol [acetaminophen]  Social History Social History   Tobacco Use  . Smoking status: Former Smoker    Packs/day: 1.00    Years: 35.00    Pack years: 35.00    Types: Cigarettes  . Smokeless tobacco: Never Used  Substance Use Topics  . Alcohol use: Not Currently    Comment: Quit 8 yrs.  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 Drug use: No   Review of Systems Constitutional: Negative for fever. Cardiovascular: Negative for chest pain. Respiratory: Negative for shortness of breath. Gastrointestinal: Negative for abdominal pain, vomiting and diarrhea. Musculoskeletal: Negative for back pain. Skin: Negative for rash. Neurological: Positive for lethargy and slurred speech  All systems negative/normal/unremarkable except as stated in the HPI  ____________________________________________   PHYSICAL EXAM:  VITAL SIGNS: ED Triage Vitals  Enc Vitals Group     BP      Pulse      Resp      Temp      Temp src      SpO2      Weight      Height      Head Circumference      Peak Flow      Pain Score      Pain Loc      Pain Edu?      Excl. in GC?    Constitutional: Well appearing and in no distress. Eyes: Conjunctivae are normal. Normal extraocular movements. ENT      Head: Normocephalic and atraumatic.      Nose: No congestion/rhinnorhea.      Mouth/Throat: Mucous membranes are moist.      Neck: No stridor. Cardiovascular: Normal rate, regular rhythm. No murmurs, rubs, or gallops. Respiratory: Normal respiratory effort without tachypnea nor retractions. Breath sounds are clear and equal  bilaterally. No wheezes/rales/rhonchi. Gastrointestinal: Soft and nontender. Normal bowel sounds Musculoskeletal: Nontender with normal range of motion in extremities. No lower extremity tenderness nor edema. Neurologic: Slow, halting speech without aphasia, otherwise unremarkable.  Decrease sensation on the left side of his face compared to right.  Generalized weakness Skin:  Skin is warm, dry and intact. No rash noted. Psychiatric: Flat affect ____________________________________________  EKG: Interpreted by me.  Sinus rhythm with rate of 86 bpm, bigeminy, prolonged PR interval, LVH with repolarization abnormality  ____________________________________________  ED COURSE:  As part of my medical decision making, I reviewed the following data within the electronic MEDICAL RECORD NUMBER History obtained from family if available, nursing notes, old chart and ekg, as well as notes from prior ED visits. Patient presented for lethargy and slurred speech with some trouble breathing, we will assess with labs and imaging as indicated at this time.   Procedures  [name] was evaluated in  Emergency Department on 03/04/2019 for the symptoms described in the history of present illness. He was evaluated in the context of the global COVID-19 pandemic, which necessitated consideration that the patient might be at risk for infection with the SARS-CoV-2 virus that causes COVID-19. Institutional protocols and algorithms that pertain to the evaluation of patients at risk for COVID-19 are in a state of rapid change based on information released by regulatory bodies including the CDC and federal and state organizations. These policies and algorithms were followed during the patient's care in the ED.  ____________________________________________   LABS (pertinent positives/negatives)  Labs Reviewed  CBC WITH DIFFERENTIAL/PLATELET - Abnormal; Notable for the following components:      Result Value   RDW 17.2 (*)     Platelets 131 (*)    All other components within normal limits  COMPREHENSIVE METABOLIC PANEL - Abnormal; Notable for the following components:   Potassium 3.3 (*)    Glucose, Bld 166 (*)    BUN 24 (*)    Creatinine, Ser 1.39 (*)    Alkaline Phosphatase 210 (*)    GFR calc non Af Amer 54 (*)    All other components within normal limits  URINALYSIS, COMPLETE (UACMP) WITH MICROSCOPIC - Abnormal; Notable for the following components:   Color, Urine YELLOW (*)    APPearance CLEAR (*)    All other components within normal limits  TROPONIN I (HIGH SENSITIVITY) - Abnormal; Notable for the following components:   Troponin I (High Sensitivity) 65 (*)    All other components within normal limits  ETHANOL  URINE DRUG SCREEN, QUALITATIVE ([facility] ONLY)    RADIOLOGY Images were viewed by me  Chest x-ray, CT head IMPRESSION:  1. No CT evidence for acute intracranial abnormality.  2. Atrophy and mild small vessel ischemic changes of the white  matter  IMPRESSION:  No acute abnormality. Stable cardiomegaly, pulmonary vascular  congestion and mild chronic interstitial lung disease.  ____________________________________________   DIFFERENTIAL DIAGNOSIS   Dehydration, electrolyte abnormality, occult infection, CVA, TIA, MI  FINAL ASSESSMENT AND PLAN  Weakness, speech difficulty   Plan: The patient had presented for weakness and slurred speech with recent difficulty breathing. Patient's labs did not reveal any acute process. Patient's imaging was reassuring.  Patient was requesting to go home.  He has a halting almost stuttering speech but no aphasia and otherwise appears to be neurologically intact despite some paresthesias.  He currently takes Xarelto, is cleared for outpatient follow-up.   Laurence Aly, MD    Note: This note was generated in part or whole with voice recognition software. Voice recognition is usually quite accurate but there are transcription errors that can  and very often do occur. I apologize for any typographical errors that were not detected and corrected.     Earleen Newport, MD 03/04/19 [phone]

## 2019-03-04 NOTE — ED Triage Notes (Signed)
Arrived by EMS from home. Wife c/o lethargy, trouble breathing yesterday and this morning started having slurred speech. Per EMS stroke screen negative. Slow to respond to questions and wife reports that's not normal. Blood sugar 258, 124/78b/p. HX A-fib, CHF and COPD. Last seen normal not clear.

## 2019-03-09 ENCOUNTER — Telehealth: Payer: Self-pay | Admitting: Family

## 2019-03-09 NOTE — Telephone Encounter (Signed)
Received call from [facility] home health regarding patient complaining of dizziness and weakness since beginning the entresto. Also says that he has some blurry vision and the nurse says that his eyes look jaundiced to her.   HR ranging from 40-62 and BP was 112/54  Patient was started on entresto 02/08/2019 and had an ED visit on 03/04/2019. ED visit was negative and he was released.   Advised the nurse to have patient stop the entresto at this time. He has a follow-up appointment scheduled with [place] on 03/14/2019. The nurse says that she will be returning to the home on 03/11/2019.

## 2019-03-13 NOTE — Progress Notes (Signed)
Patient ID: [name], male    DOB: [birth year], 63 y.o.   MRN: [id number]  HPI  [name] is a 63 y/o male with a history of CAD, DM, hyperlipidemia, HTN, CKD, stroke, COPD, gout, atrial fibrillation, previous tobacco use and chronic heart failure.   Echo report from 06/14/18 reviewed and showed an EF of 30-35% along with mild James.  Catheterization done 07/28/16 and showed:  Prox RCA lesion, 0 %stenosed.  Mid RCA lesion, 10 %stenosed.  Mid Cx lesion, 0 %stenosed.  Mid Cx to Dist Cx lesion, 0 %stenosed.  Prox Cx lesion, 40 %stenosed.  Prox LAD lesion, 40 %stenosed.  Mid LAD lesion, 40 %stenosed.  Dist LAD lesion, 30 %stenosed.  LV end diastolic pressure is severely elevated. 1. Patent stents in the right coronary artery and left circumflex with no significant restenosis. Moderate LAD disease.  2. Right heart catheterization showed severely elevated filling pressures and severe pulmonary hypertension. RV pressure was 82/18, PDA 81/43 with a mean of 57. RA pressure was 11. Left ventricular end-diastolic pressure was 34 mmHg. Pulmonary capillary wedge pressure could not be obtained due to inability to advance the Swan-Ganz catheter. Cardiac output is 4.44 with a cardiac index of 1.94.  Went to ED 03/04/19 for weakness and slurred speech and was treated and discharged. Admitted 01/22/2019 due to acute on chronic heart failure. Cardiology consult obtained. IV lasix given initially and then transitioned to oral diuretics. Discharged after 4 days.   He presents today for a follow-up visit with a chief complaint of shortness of breath on minimal exertion associated with fatigue and dizziness with walking. Also notes chronic left knee pain and using a cane to ambulate. He denies chest pain, leg swelling, abdominal distention and trouble sleeping.   He states that since he started entresto, he has noticed weakness, dizziness, and trouble speaking. Stopped entresto on 03/09/19 due to weakness,  dizziness, and HR 40-62 per home health RN. He states he does not think it was the entresto itself, but that all the medications he was taking made his blood pressure too low.   Past Medical History:  Diagnosis Date  . CAD in native artery    a. stress echo 12/2007 abnl, EF > 55%, b. LHC 01/28/08: mLAD 30, D1 40, dLCx 70, pRCA 30, mRCA 70, mRCA lesion 2 80, PDA 90, s/p PCI/BMS to prox and distal RCA, s/p PCI/BMS to PDA; c. patient reports PCI[facility] x 2 in early [place] [facility] (no records on file) d. 06/2016: cath showing patent stents along RCA and LCx with moderate 40% stenosis along the LAD.   [name]. Carotid arterial disease (HCC)    a. 05/2018 CTA Head/neck: 80-90% stenosis @ L carotid bifurcation, extending into the prox LICA. 60% stenosis @ R carotid bifurcation. 50-75% bilateral distal cavernous carotid dzs.  . Chronic combined systolic (congestive) and diastolic (congestive) heart failure (HCC)    a. 2009 Echo: > 55%; b. 06/2016: EF 25-30%; c. 12/2017 Echo: EF 30-35%, diff HK; d. 05/2018 Echo: 30-35%, mild conc LVH, diff HK. Mild James. Mildly dil LA/RA.  [name]. Chronic kidney disease   . COPD (chronic obstructive pulmonary disease) (HCC)   . Diabetes mellitus without complication (HCC)   . Gout   . Hyperlipidemia   . Hypertension   . Hypertensive heart disease   . Ischemic cardiomyopathy    a. 05/2018: 30-35%.  . Obesity   . Persistent atrial fibrillation 01/2017   a. cardioverted 9/18 to NSR; b. 12/2017 noted  to be back in Afib-outpt dccv rec; c. CHA2DS2VASc = 6-->Xarelto.  . Stroke (cerebrum) (HCC)    a. 05/2018 MRI: small patchy acute to subacute cortial and white matter infarct in the bilat cerebrum. Mild chronic small vessel ischmia; b. 05/2018 Carotid U/S: L-carotid bifurcation 80-90% stenosed, R- carotid bifurcation 60% stenosed.  . Tobacco abuse    Past Surgical History:  Procedure Laterality Date  . CARDIAC CATHETERIZATION  2009   [facility];   . CARDIAC CATHETERIZATION  2010  . CARDIAC  CATHETERIZATION N/A 07/28/2016   Procedure: Right and Left Heart Cath and possible PCI;  Surgeon: Iran OuchMuhammad A Arida, MD;  Location: [facility] INVASIVE CV LAB;  Service: Cardiovascular;  Laterality: N/A;  . CARDIOVERSION N/A 03/27/2017   Procedure: CARDIOVERSION;  Surgeon: Iran OuchArida, Muhammad A, MD;  Location: [facility] ORS;  Service: Cardiovascular;  Laterality: N/A;  . CARDIOVERSION N/A 07/30/2018   Procedure: CARDIOVERSION;  Surgeon: Antonieta IbaGollan, Timothy J, MD;  Location: [facility] ORS;  Service: Cardiovascular;  Laterality: N/A;  . CORONARY ANGIOPLASTY  2009   [facility].   Family History  Problem Relation Age of Onset  . Heart attack Mother [id number]  . Hypertension Mother   . Cancer Brother    Social History   Tobacco Use  . Smoking status: Former Smoker    Packs/day: 1.00    Years: 35.00    Pack years: 35.00    Types: Cigarettes  . Smokeless tobacco: Never Used  Substance Use Topics  . Alcohol use: Not Currently    Comment: Quit 8 yrs.  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 Aspirin Other (See Comments)    Told not to take because of blood thinner  . Ibuprofen Nausea And Vomiting  . Tylenol [Acetaminophen] Nausea And Vomiting   Prior to Admission medications   Medication Sig Start Date End Date Taking? Authorizing Provider  albuterol-ipratropium (COMBIVENT) 18-103 MCG/ACT inhaler Inhale 2 puffs into the lungs every 4 (four) hours as needed for wheezing.   Yes [provider]  atorvastatin (LIPITOR) 80 MG tablet Take 80 mg by mouth daily.   Yes [provider]  budesonide-formoterol (SYMBICORT) 160-4.5 MCG/ACT inhaler Inhale 2 puffs into the lungs 2 (two) times daily.   Yes [provider]  carvedilol (COREG) 6.25 MG tablet Take 1 tablet (6.25 mg total) by mouth 2 (two) times daily with a meal. 07/31/18  Yes Altamese DillingVachhani, Vaibhavkumar, MD  docusate sodium (COLACE) 100 MG capsule Take 1 capsule (100 mg total) by mouth 2 (two) times daily. 09/02/18  Yes  Hackney, Tina A, FNP  isosorbide-hydrALAZINE (BIDIL) 20-37.5 MG tablet Take 1 tablet by mouth 2 (two) times daily.    Yes [provider]  magnesium oxide (MAG-OX) 400 MG tablet Take 400 mg by mouth 2 (two) times daily.   Yes [provider]  methocarbamol (ROBAXIN) 500 MG tablet Take 500 mg by mouth 4 (four) times daily.   Yes [provider]  nitroGLYCERIN (NITROSTAT) 0.4 MG SL tablet Place 0.4 mg under the tongue every 5 (five) minutes as needed for chest pain.   Yes [provider]  oxyCODONE (OXY IR/ROXICODONE) 5 MG immediate release tablet Take 5 mg by mouth 2 (two) times a day. 01/14/19  Yes [provider]  potassium chloride SA (K-DUR,KLOR-CON) 20 MEQ tablet Take 20 mEq by mouth daily.   Yes [provider]  rivaroxaban (XARELTO) 15 MG TABS tablet Take 1 tablet (15 mg total) by mouth daily with  supper. Patient taking differently: Take 20 mg by mouth daily with supper.  08/20/18  Yes Fritzi Mandes, MD  torsemide (DEMADEX) 20 MG tablet Take 2 tablets (40 mg total) by mouth 2 (two) times daily. 01/26/19 03/14/19 Yes Pyreddy, Reatha Harps, MD  traZODone (DESYREL) 50 MG tablet Take 50 mg by mouth at bedtime.   Yes [provider]  sacubitril-valsartan (ENTRESTO) 24-26 MG Take 1 tablet by mouth 2 (two) times daily. Patient not taking: Reported on 03/14/2019 02/08/19   Alisa Graff, FNP  spironolactone (ALDACTONE) 25 MG tablet Take 1 tablet (25 mg total) by mouth daily. 09/09/18   Rise Mu, PA-C      Review of Systems  Constitutional: Positive for fatigue (with walking). Negative for appetite change.  HENT: Negative for congestion, rhinorrhea and sore throat.   Eyes: Negative.   Respiratory: Positive for shortness of breath (with minimal exertion). Negative for cough and chest tightness.   Cardiovascular: Negative for chest pain, palpitations and leg swelling.  Gastrointestinal: Negative for abdominal distention and abdominal pain.   Endocrine: Negative.   Genitourinary: Negative.   Musculoskeletal: Positive for arthralgias (left knee with swelling) and back pain. Negative for neck pain.  Skin: Negative.   Allergic/Immunologic: Negative.   Neurological: Positive for dizziness (with walking). Negative for light-headedness.  Hematological: Negative for adenopathy. Does not bruise/bleed easily.  Psychiatric/Behavioral: Negative for dysphoric mood and sleep disturbance (sleeping on 2 pillows; wearing CPAP ). The patient is not nervous/anxious.    Vitals:   03/14/19 1040  BP: (!) 138/99  Pulse: 95  Resp: 18  SpO2: 100%   Filed Weights   03/14/19 1040  Weight: 215 lb (97.5 kg)   Lab Results  Component Value Date   CREATININE 1.39 (H) 03/04/2019   CREATININE 1.76 (H) 01/26/2019   CREATININE 1.69 (H) 01/25/2019     Physical Exam Vitals signs and nursing note reviewed.  Constitutional:      Appearance: He is well-developed.  HENT:     Head: Normocephalic and atraumatic.  Neck:     Musculoskeletal: Normal range of motion and neck supple.     Vascular: No JVD.  Cardiovascular:     Rate and Rhythm: Normal rate and regular rhythm.  Pulmonary:     Effort: Pulmonary effort is normal. No respiratory distress.     Breath sounds: No wheezing or rales.  Abdominal:     Palpations: Abdomen is soft.     Tenderness: There is no abdominal tenderness.  Musculoskeletal:        General: Swelling (left knee) present.     Right lower leg: He exhibits no tenderness. Edema (trace) present.     Left lower leg: He exhibits no tenderness. Edema (trace) present.  Skin:    General: Skin is warm and dry.  Neurological:     General: No focal deficit present.     Mental Status: He is alert and oriented to person, place, and time.  Psychiatric:        Mood and Affect: Mood normal.        Behavior: Behavior normal.    Assessment & Plan:   1: Chronic heart failure with reduced ejection fraction- - NYHA class III - euvolemic  today - weights himself every day, states his weight is up 4 pounds in the last 3 days. Reminded to call for an overnight weight gain of >2 pounds or a weekly weight gain of >5 pounds - Weight is the same from 01/26/2019 - not adding  salt and his wife has been reading food labels. Reminded to closely follow a 2000mg  sodium diet  - Will restart entresto 24-26mg  BID - Stop bidil. May be able to restart in the future if entresto is tolerated.  - will check BMP at next visit - had telemedicine visit with cardiology ([facility]) 10/12/2018 - BNP 01/25/2019 was 782.0 - cardioverted 07/30/2018  2: HTN- - BP 138/99 today - goes to [facility] for primary care and saw them last week - BMP from 03/04/2019 reviewed and showed sodium 141, potassium 3.3, creatinine 1.39 and GFR >60  3: DM- - A1c 08/18/2018 was 8.8% - CBG yesterday 119  Medication list was reviewed.  Follow-up in 1 week or sooner for any questions/problems before then.

## 2019-03-14 ENCOUNTER — Ambulatory Visit: Payer: No Typology Code available for payment source | Attending: Family | Admitting: Family

## 2019-03-14 ENCOUNTER — Encounter: Payer: Self-pay | Admitting: Family

## 2019-03-14 ENCOUNTER — Other Ambulatory Visit: Payer: Self-pay

## 2019-03-14 VITALS — BP 138/99 | HR 95 | Resp 18 | Ht 71.0 in | Wt 215.0 lb

## 2019-03-14 DIAGNOSIS — E785 Hyperlipidemia, unspecified: Secondary | ICD-10-CM | POA: Insufficient documentation

## 2019-03-14 DIAGNOSIS — I10 Essential (primary) hypertension: Secondary | ICD-10-CM

## 2019-03-14 DIAGNOSIS — N189 Chronic kidney disease, unspecified: Secondary | ICD-10-CM | POA: Insufficient documentation

## 2019-03-14 DIAGNOSIS — J449 Chronic obstructive pulmonary disease, unspecified: Secondary | ICD-10-CM | POA: Diagnosis not present

## 2019-03-14 DIAGNOSIS — Z87891 Personal history of nicotine dependence: Secondary | ICD-10-CM | POA: Diagnosis not present

## 2019-03-14 DIAGNOSIS — R0602 Shortness of breath: Secondary | ICD-10-CM | POA: Diagnosis present

## 2019-03-14 DIAGNOSIS — Z8249 Family history of ischemic heart disease and other diseases of the circulatory system: Secondary | ICD-10-CM | POA: Diagnosis not present

## 2019-03-14 DIAGNOSIS — Z8774 Personal history of (corrected) congenital malformations of heart and circulatory system: Secondary | ICD-10-CM | POA: Diagnosis not present

## 2019-03-14 DIAGNOSIS — I255 Ischemic cardiomyopathy: Secondary | ICD-10-CM | POA: Diagnosis not present

## 2019-03-14 DIAGNOSIS — I4891 Unspecified atrial fibrillation: Secondary | ICD-10-CM | POA: Diagnosis not present

## 2019-03-14 DIAGNOSIS — M25562 Pain in left knee: Secondary | ICD-10-CM | POA: Insufficient documentation

## 2019-03-14 DIAGNOSIS — Z79899 Other long term (current) drug therapy: Secondary | ICD-10-CM | POA: Diagnosis not present

## 2019-03-14 DIAGNOSIS — I130 Hypertensive heart and chronic kidney disease with heart failure and stage 1 through stage 4 chronic kidney disease, or unspecified chronic kidney disease: Secondary | ICD-10-CM | POA: Diagnosis not present

## 2019-03-14 DIAGNOSIS — M109 Gout, unspecified: Secondary | ICD-10-CM | POA: Insufficient documentation

## 2019-03-14 DIAGNOSIS — E1122 Type 2 diabetes mellitus with diabetic chronic kidney disease: Secondary | ICD-10-CM | POA: Diagnosis not present

## 2019-03-14 DIAGNOSIS — I5042 Chronic combined systolic (congestive) and diastolic (congestive) heart failure: Secondary | ICD-10-CM | POA: Insufficient documentation

## 2019-03-14 DIAGNOSIS — Z7901 Long term (current) use of anticoagulants: Secondary | ICD-10-CM | POA: Diagnosis not present

## 2019-03-14 DIAGNOSIS — G8929 Other chronic pain: Secondary | ICD-10-CM | POA: Insufficient documentation

## 2019-03-14 DIAGNOSIS — Z955 Presence of coronary angioplasty implant and graft: Secondary | ICD-10-CM | POA: Insufficient documentation

## 2019-03-14 DIAGNOSIS — Z8673 Personal history of transient ischemic attack (TIA), and cerebral infarction without residual deficits: Secondary | ICD-10-CM | POA: Diagnosis not present

## 2019-03-14 DIAGNOSIS — I2510 Atherosclerotic heart disease of native coronary artery without angina pectoris: Secondary | ICD-10-CM | POA: Insufficient documentation

## 2019-03-14 DIAGNOSIS — R42 Dizziness and giddiness: Secondary | ICD-10-CM | POA: Insufficient documentation

## 2019-03-14 DIAGNOSIS — I272 Pulmonary hypertension, unspecified: Secondary | ICD-10-CM | POA: Insufficient documentation

## 2019-03-14 DIAGNOSIS — I5022 Chronic systolic (congestive) heart failure: Secondary | ICD-10-CM

## 2019-03-14 NOTE — Patient Instructions (Addendum)
Continue weighing daily and call for an overnight weight gain of >2 pounds or a weekly weight gain of >5 pounds.  Start taking entresto 24-26mg  twice a day  Stop taking bidil (isosorbide-hydralazine)

## 2019-03-17 ENCOUNTER — Telehealth: Payer: Self-pay | Admitting: Family

## 2019-03-17 ENCOUNTER — Encounter: Payer: Self-pay | Admitting: Family

## 2019-03-17 ENCOUNTER — Other Ambulatory Visit: Payer: Self-pay | Admitting: Family

## 2019-03-17 NOTE — Telephone Encounter (Signed)
The home health nurse called to say that patient was having similar symptoms from [facility] as he was having when he tried to initially take it.  He was willing to try it again and started it back 3 days ago and "almost immediately", he developed dizziness, weakness and stuttering speech like he did previously. He stopped taking it yesterday and started feeling better.   Advised his nurse to keep him off of it and that I would add it to his allergies since he's tried taking it twice and both times, he had the same reaction. The nurse says his BP today was 138/72.

## 2019-03-21 NOTE — Progress Notes (Deleted)
Patient ID: [name], male    DOB: [birth year], 63 y.o.   MRN: [id number]  HPI  [name] is a 63 y/o male with a history of CAD, DM, hyperlipidemia, HTN, CKD, stroke, COPD, gout, atrial fibrillation, previous tobacco use and chronic heart failure.   Echo report from 06/14/18 reviewed and showed an EF of 30-35% along with mild James.  Catheterization done 07/28/16 and showed:  Prox RCA lesion, 0 %stenosed.  Mid RCA lesion, 10 %stenosed.  Mid Cx lesion, 0 %stenosed.  Mid Cx to Dist Cx lesion, 0 %stenosed.  Prox Cx lesion, 40 %stenosed.  Prox LAD lesion, 40 %stenosed.  Mid LAD lesion, 40 %stenosed.  Dist LAD lesion, 30 %stenosed.  LV end diastolic pressure is severely elevated. 1. Patent stents in the right coronary artery and left circumflex with no significant restenosis. Moderate LAD disease.  2. Right heart catheterization showed severely elevated filling pressures and severe pulmonary hypertension. RV pressure was 82/18, PDA 81/43 with a mean of 57. RA pressure was 11. Left ventricular end-diastolic pressure was 34 mmHg. Pulmonary capillary wedge pressure could not be obtained due to inability to advance the Swan-Ganz catheter. Cardiac output is 4.44 with a cardiac index of 1.94.  Went to ED 03/04/19 for weakness and slurred speech and was treated and discharged. Admitted 01/22/2019 due to acute on chronic heart failure. Cardiology consult obtained. IV lasix given initially and then transitioned to oral diuretics. Discharged after 4 days.   He presents today for a follow-up visit with a chief complaint of  Last visit, Bidil was stopped in attempt to start entresto. After taking entresto for three days, he states the dizziness, weakness, and stuttering speech started again almost "immediately." Stopped taking entresto on 03/17/2019 after calling the office.   Past Medical History:  Diagnosis Date  . CAD in native artery    a. stress echo 12/2007 abnl, EF > 55%, b. LHC 01/28/08:  mLAD 30, D1 40, dLCx 70, pRCA 30, mRCA 70, mRCA lesion 2 80, PDA 90, s/p PCI/BMS to prox and distal RCA, s/p PCI/BMS to PDA; c. patient reports PCI/stenting x 2 in early 2017 at the [place] (no records on file) d. 06/2016: cath showing patent stents along RCA and LCx with moderate 40% stenosis along the LAD.   [name] Carotid arterial disease (HCC)    a. 05/2018 CTA Head/neck: 80-90% stenosis @ L carotid bifurcation, extending into the prox LICA. 60% stenosis @ R carotid bifurcation. 50-75% bilateral distal cavernous carotid dzs.  . Chronic combined systolic (congestive) and diastolic (congestive) heart failure (HCC)    a. 2009 Echo: > 55%; b. 06/2016: EF 25-30%; c. 12/2017 Echo: EF 30-35%, diff HK; d. 05/2018 Echo: 30-35%, mild conc LVH, diff HK. Mild James. Mildly dil LA/RA.  [name] Chronic kidney disease   . COPD (chronic obstructive pulmonary disease) (HCC)   . Diabetes mellitus without complication (HCC)   . Gout   . Hyperlipidemia   . Hypertension   . Hypertensive heart disease   . Ischemic cardiomyopathy    a. 05/2018: 30-35%.  . Obesity   . Persistent atrial fibrillation 01/2017   a. cardioverted 9/18 to NSR; b. 12/2017 noted to be back in Afib-outpt dccv rec; c. CHA2DS2VASc = 6-->Xarelto.  . Stroke (cerebrum) (HCC)    a. 05/2018 MRI: small patchy acute to subacute cortial and white matter infarct in the bilat cerebrum. Mild chronic small vessel ischmia; b. 05/2018 Carotid U/S: L-carotid bifurcation 80-90% stenosed, R- carotid bifurcation 60% stenosed.  [name]  Tobacco abuse    Past Surgical History:  Procedure Laterality Date  . CARDIAC CATHETERIZATION  2009   [facility];   . CARDIAC CATHETERIZATION  2010  . CARDIAC CATHETERIZATION N/A 07/28/2016   Procedure: Right and Left Heart Cath and possible PCI;  Surgeon: Iran Ouch, MD;  Location: [facility] INVASIVE CV LAB;  Service: Cardiovascular;  Laterality: N/A;  . CARDIOVERSION N/A 03/27/2017   Procedure: CARDIOVERSION;  Surgeon: Iran Ouch, MD;  Location:  [facility] ORS;  Service: Cardiovascular;  Laterality: N/A;  . CARDIOVERSION N/A 07/30/2018   Procedure: CARDIOVERSION;  Surgeon: Antonieta Iba, MD;  Location: [facility] ORS;  Service: Cardiovascular;  Laterality: N/A;  . CORONARY ANGIOPLASTY  2009   s/p stent placement at [facility].   Family History  Problem Relation Age of Onset  . Heart attack Mother 52  . Hypertension Mother   . Cancer Brother    Social History   Tobacco Use  . Smoking status: Former Smoker    Packs/day: 1.00    Years: 35.00    Pack years: 35.00    Types: Cigarettes  . Smokeless tobacco: Never Used  Substance Use Topics  . Alcohol use: Not Currently    Comment: Quit 8 yrs.  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 Aspirin Other (See Comments)    Told not to take because of blood thinner  . Entresto [Sacubitril-Valsartan] Other (See Comments)    Dizziness, weakness and stuttering  . Ibuprofen Nausea And Vomiting  . Tylenol [Acetaminophen] Nausea And Vomiting       Review of Systems  Constitutional: Positive for fatigue (with walking). Negative for appetite change.  HENT: Negative for congestion, rhinorrhea and sore throat.   Eyes: Negative.   Respiratory: Positive for shortness of breath (with minimal exertion). Negative for cough and chest tightness.   Cardiovascular: Negative for chest pain, palpitations and leg swelling.  Gastrointestinal: Negative for abdominal distention and abdominal pain.  Endocrine: Negative.   Genitourinary: Negative.   Musculoskeletal: Positive for arthralgias (left knee with swelling) and back pain. Negative for neck pain.  Skin: Negative.   Allergic/Immunologic: Negative.   Neurological: Positive for dizziness (with walking). Negative for light-headedness.  Hematological: Negative for adenopathy. Does not bruise/bleed easily.  Psychiatric/Behavioral: Negative for dysphoric mood and sleep disturbance (sleeping on 2 pillows; wearing CPAP ). The patient is not  nervous/anxious.      Physical Exam Vitals signs and nursing note reviewed.  Constitutional:      Appearance: He is well-developed.  HENT:     Head: Normocephalic and atraumatic.  Neck:     Musculoskeletal: Normal range of motion and neck supple.     Vascular: No JVD.  Cardiovascular:     Rate and Rhythm: Normal rate and regular rhythm.  Pulmonary:     Effort: Pulmonary effort is normal. No respiratory distress.     Breath sounds: No wheezing or rales.  Abdominal:     Palpations: Abdomen is soft.     Tenderness: There is no abdominal tenderness.  Musculoskeletal:        General: Swelling (left knee) present.     Right lower leg: He exhibits no tenderness. Edema (trace) present.     Left lower leg: He exhibits no tenderness. Edema (trace) present.  Skin:    General: Skin is warm and dry.  Neurological:     General: No focal deficit present.     Mental Status: He is alert and oriented to person, place,  and time.  Psychiatric:        Mood and Affect: Mood normal.        Behavior: Behavior normal.    Assessment & Plan:   1: Chronic heart failure with reduced ejection fraction- - NYHA class III - euvolemic today - weights himself every day, states his weight is up 4 pounds in the last 3 days. Reminded to call for an overnight weight gain of >2 pounds or a weekly weight gain of >5 pounds - Weight is the same from 01/26/2019 - not adding salt and his wife has been reading food labels. Reminded to closely follow a 2000mg  sodium diet  - Will restart entresto 24-26mg  BID - Stop bidil. May be able to restart in the future if entresto is tolerated.  - will check BMP at next visit - had telemedicine visit with cardiology ([facility]) 10/12/2018 - BNP 01/25/2019 was 782.0 - cardioverted 07/30/2018  2: HTN- - BP 138/99 today - goes to [facility] for primary care and saw them last week - BMP from 03/04/2019 reviewed and showed sodium 141, potassium 3.3, creatinine 1.39 and GFR >60  3:  DM- - A1c 08/18/2018 was 8.8% - CBG yesterday 119  Medication list was reviewed.  Follow-up in 1 week or sooner for any questions/problems before then.

## 2019-03-23 ENCOUNTER — Ambulatory Visit: Payer: No Typology Code available for payment source | Admitting: Family

## 2019-03-23 ENCOUNTER — Telehealth: Payer: Self-pay | Admitting: Family

## 2019-03-23 NOTE — Telephone Encounter (Signed)
Patient did not show for his Heart Failure Clinic appointment on 03/23/2019. Will attempt to reschedule.

## 2019-03-29 NOTE — Progress Notes (Deleted)
Patient ID: James CluckSamuel Aja, male    DOB: 1955-11-20, 63 y.o.   MRN: [id number]  HPI  [name] is a 63 y/o male with a history of CAD, DM, hyperlipidemia, HTN, CKD, stroke, COPD, gout, atrial fibrillation, previous tobacco use and chronic heart failure.   Echo report from 06/14/18 reviewed and showed an EF of 30-35% along with mild James.  Catheterization done 07/28/16 and showed:  Prox RCA lesion, 0 %stenosed.  Mid RCA lesion, 10 %stenosed.  Mid Cx lesion, 0 %stenosed.  Mid Cx to Dist Cx lesion, 0 %stenosed.  Prox Cx lesion, 40 %stenosed.  Prox LAD lesion, 40 %stenosed.  Mid LAD lesion, 40 %stenosed.  Dist LAD lesion, 30 %stenosed.  LV end diastolic pressure is severely elevated. 1. Patent stents in the right coronary artery and left circumflex with no significant restenosis. Moderate LAD disease.  2. Right heart catheterization showed severely elevated filling pressures and severe pulmonary hypertension. RV pressure was 82/18, PDA 81/43 with a mean of 57. RA pressure was 11. Left ventricular end-diastolic pressure was 34 mmHg. Pulmonary capillary wedge pressure could not be obtained due to inability to advance the Swan-Ganz catheter. Cardiac output is 4.44 with a cardiac index of 1.94.  Went to ED 03/04/19 for weakness and slurred speech and was treated and discharged. Admitted 01/22/2019 due to acute on chronic heart failure. Cardiology consult obtained. IV lasix given initially and then transitioned to oral diuretics. Discharged after 4 days.   [name] presents today for a follow-up visit with a chief complaint of   Past Medical History:  Diagnosis Date  . CAD in native artery    a. stress echo 12/2007 abnl, EF > 55%, b. LHC 01/28/08: mLAD 30, D1 40, dLCx 70, pRCA 30, mRCA 70, mRCA lesion 2 80, PDA 90, s/p PCI/BMS to prox and distal RCA, s/p PCI/BMS to PDA; c. patient reports PCI/stenting x 2 in early 2017 at [facility] (no records on file) d. 06/2016: cath showing patent stents along RCA and  LCx with moderate 40% stenosis along the LAD.   [name]. Carotid arterial disease (HCC)    a. 05/2018 CTA Head/neck: 80-90% stenosis @ L carotid bifurcation, extending into the prox LICA. 60% stenosis @ R carotid bifurcation. 50-75% bilateral distal cavernous carotid dzs.  . Chronic combined systolic (congestive) and diastolic (congestive) heart failure (HCC)    a. 2009 Echo: > 55%; b. 06/2016: EF 25-30%; c. 12/2017 Echo: EF 30-35%, diff HK; d. 05/2018 Echo: 30-35%, mild conc LVH, diff HK. Mild James. Mildly dil LA/RA.  [name]. Chronic kidney disease   . COPD (chronic obstructive pulmonary disease) (HCC)   . Diabetes mellitus without complication (HCC)   . Gout   . Hyperlipidemia   . Hypertension   . Hypertensive heart disease   . Ischemic cardiomyopathy    a. 05/2018: 30-35%.  . Obesity   . Persistent atrial fibrillation 01/2017   a. cardioverted 9/18 to NSR; b. 12/2017 noted to be back in Afib-outpt dccv rec; c. CHA2DS2VASc = 6-->Xarelto.  . Stroke (cerebrum) (HCC)    a. 05/2018 MRI: small patchy acute to subacute cortial and white matter infarct in the bilat cerebrum. Mild chronic small vessel ischmia; b. 05/2018 Carotid U/S: L-carotid bifurcation 80-90% stenosed, R- carotid bifurcation 60% stenosed.  . Tobacco abuse    Past Surgical History:  Procedure Laterality Date  . CARDIAC CATHETERIZATION  2009   [facility];   . CARDIAC CATHETERIZATION  2010  . CARDIAC CATHETERIZATION N/A 07/28/2016   Procedure:  Right and Left Heart Cath and possible PCI;  Surgeon: Iran Ouch, MD;  Location: [facility] INVASIVE CV LAB;  Service: Cardiovascular;  Laterality: N/A;  . CARDIOVERSION N/A 03/27/2017   Procedure: CARDIOVERSION;  Surgeon: Iran Ouch, MD;  Location: [facility] ORS;  Service: Cardiovascular;  Laterality: N/A;  . CARDIOVERSION N/A 07/30/2018   Procedure: CARDIOVERSION;  Surgeon: Antonieta Iba, MD;  Location: [facility] ORS;  Service: Cardiovascular;  Laterality: N/A;  . CORONARY ANGIOPLASTY  2009   s/p stent  placement at [facility].   Family History  Problem Relation Age of Onset  . Heart attack Mother 59  . Hypertension Mother   . Cancer Brother    Social History   Tobacco Use  . Smoking status: Former Smoker    Packs/day: 1.00    Years: 35.00    Pack years: 35.00    Types: Cigarettes  . Smokeless tobacco: Never Used  Substance Use Topics  . Alcohol use: Not Currently    Comment: Quit 8 yrs.  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 Aspirin Other (See Comments)    Told not to take because of blood thinner  . Entresto [Sacubitril-Valsartan] Other (See Comments)    Dizziness, weakness and stuttering  . Ibuprofen Nausea And Vomiting  . Tylenol [Acetaminophen] Nausea And Vomiting       Review of Systems  Constitutional: Positive for fatigue (with walking). Negative for appetite change.  HENT: Negative for congestion, rhinorrhea and sore throat.   Eyes: Negative.   Respiratory: Positive for shortness of breath (with minimal exertion). Negative for cough and chest tightness.   Cardiovascular: Negative for chest pain, palpitations and leg swelling.  Gastrointestinal: Negative for abdominal distention and abdominal pain.  Endocrine: Negative.   Genitourinary: Negative.   Musculoskeletal: Positive for arthralgias (left knee with swelling) and back pain. Negative for neck pain.  Skin: Negative.   Allergic/Immunologic: Negative.   Neurological: Positive for dizziness (with walking). Negative for light-headedness.  Hematological: Negative for adenopathy. Does not bruise/bleed easily.  Psychiatric/Behavioral: Negative for dysphoric mood and sleep disturbance (sleeping on 2 pillows; wearing CPAP ). The patient is not nervous/anxious.       Physical Exam Vitals signs and nursing note reviewed.  Constitutional:      Appearance: [name] is well-developed.  HENT:     Head: Normocephalic and atraumatic.  Neck:     Musculoskeletal: Normal range of motion and neck supple.      Vascular: No JVD.  Cardiovascular:     Rate and Rhythm: Normal rate and regular rhythm.  Pulmonary:     Effort: Pulmonary effort is normal. No respiratory distress.     Breath sounds: No wheezing or rales.  Abdominal:     Palpations: Abdomen is soft.     Tenderness: There is no abdominal tenderness.  Musculoskeletal:        General: Swelling (left knee) present.     Right lower leg: [name] exhibits no tenderness. Edema (trace) present.     Left lower leg: [name] exhibits no tenderness. Edema (trace) present.  Skin:    General: Skin is warm and dry.  Neurological:     General: No focal deficit present.     Mental Status: [name] is alert and oriented to person, place, and time.  Psychiatric:        Mood and Affect: Mood normal.        Behavior: Behavior normal.    Assessment & Plan:   1: Chronic  heart failure with reduced ejection fraction- - NYHA class III - euvolemic today - weights himself every day, states his weight is up 4 pounds in the last 3 days. Reminded to call for an overnight weight gain of >2 pounds or a weekly weight gain of >5 pounds - Weight 215 from last visit here 2 weeks ago - not adding salt and his wife has been reading food labels. Reminded to closely follow a 2000mg  sodium diet  - patient unable to tolerate entresto as [name]'s tried it twice - had telemedicine visit with cardiology ([facility]) 10/12/2018 - BNP 01/25/2019 was 782.0 - cardioverted 07/30/2018  2: HTN- - BP - goes to [facility] care and saw them last week - BMP from 03/04/2019 reviewed and showed sodium 141, potassium 3.3, creatinine 1.39 and GFR >60  3: DM- - A1c 08/18/2018 was 8.8% - CBG yesterday   Medication list was reviewed.

## 2019-03-31 ENCOUNTER — Telehealth: Payer: Self-pay | Admitting: Family

## 2019-03-31 ENCOUNTER — Ambulatory Visit: Payer: No Typology Code available for payment source | Admitting: Family

## 2019-03-31 NOTE — Telephone Encounter (Signed)
Patient did not show for his Heart Failure Clinic appointment on 03/31/2019. Will attempt to reschedule.   Of note, this is the 11th appointment that he has missed.

## 2019-04-04 ENCOUNTER — Inpatient Hospital Stay
Admission: EM | Admit: 2019-04-04 | Discharge: 2019-04-16 | DRG: 064 | Disposition: A | Discharge status: Hospice | Payer: No Typology Code available for payment source | Attending: Internal Medicine | Admitting: Internal Medicine

## 2019-04-04 ENCOUNTER — Emergency Department
Admission: EM | Admit: 2019-04-04 | Discharge: 2019-04-04 | Disposition: A | Discharge status: Home / Self Care | Payer: No Typology Code available for payment source | Attending: Emergency Medicine | Admitting: Emergency Medicine

## 2019-04-04 ENCOUNTER — Other Ambulatory Visit: Payer: Self-pay

## 2019-04-04 ENCOUNTER — Emergency Department: Payer: No Typology Code available for payment source

## 2019-04-04 ENCOUNTER — Encounter: Payer: Self-pay | Admitting: *Deleted

## 2019-04-04 ENCOUNTER — Encounter: Payer: Self-pay | Admitting: Emergency Medicine

## 2019-04-04 DIAGNOSIS — I69351 Hemiplegia and hemiparesis following cerebral infarction affecting right dominant side: Secondary | ICD-10-CM | POA: Diagnosis not present

## 2019-04-04 DIAGNOSIS — R402142 Coma scale, eyes open, spontaneous, at arrival to emergency department: Secondary | ICD-10-CM | POA: Diagnosis present

## 2019-04-04 DIAGNOSIS — Z87891 Personal history of nicotine dependence: Secondary | ICD-10-CM | POA: Insufficient documentation

## 2019-04-04 DIAGNOSIS — J449 Chronic obstructive pulmonary disease, unspecified: Secondary | ICD-10-CM | POA: Diagnosis present

## 2019-04-04 DIAGNOSIS — I420 Dilated cardiomyopathy: Secondary | ICD-10-CM | POA: Diagnosis present

## 2019-04-04 DIAGNOSIS — Z7189 Other specified counseling: Secondary | ICD-10-CM

## 2019-04-04 DIAGNOSIS — R0602 Shortness of breath: Secondary | ICD-10-CM

## 2019-04-04 DIAGNOSIS — I158 Other secondary hypertension: Secondary | ICD-10-CM | POA: Diagnosis present

## 2019-04-04 DIAGNOSIS — J9601 Acute respiratory failure with hypoxia: Secondary | ICD-10-CM | POA: Diagnosis not present

## 2019-04-04 DIAGNOSIS — I4892 Unspecified atrial flutter: Secondary | ICD-10-CM | POA: Diagnosis not present

## 2019-04-04 DIAGNOSIS — E119 Type 2 diabetes mellitus without complications: Secondary | ICD-10-CM

## 2019-04-04 DIAGNOSIS — I4819 Other persistent atrial fibrillation: Secondary | ICD-10-CM | POA: Diagnosis present

## 2019-04-04 DIAGNOSIS — N182 Chronic kidney disease, stage 2 (mild): Secondary | ICD-10-CM | POA: Diagnosis present

## 2019-04-04 DIAGNOSIS — E1122 Type 2 diabetes mellitus with diabetic chronic kidney disease: Secondary | ICD-10-CM | POA: Insufficient documentation

## 2019-04-04 DIAGNOSIS — R531 Weakness: Secondary | ICD-10-CM | POA: Diagnosis present

## 2019-04-04 DIAGNOSIS — G9340 Encephalopathy, unspecified: Secondary | ICD-10-CM | POA: Diagnosis not present

## 2019-04-04 DIAGNOSIS — I130 Hypertensive heart and chronic kidney disease with heart failure and stage 1 through stage 4 chronic kidney disease, or unspecified chronic kidney disease: Secondary | ICD-10-CM | POA: Diagnosis present

## 2019-04-04 DIAGNOSIS — M79604 Pain in right leg: Secondary | ICD-10-CM

## 2019-04-04 DIAGNOSIS — E1165 Type 2 diabetes mellitus with hyperglycemia: Secondary | ICD-10-CM | POA: Diagnosis present

## 2019-04-04 DIAGNOSIS — I5023 Acute on chronic systolic (congestive) heart failure: Secondary | ICD-10-CM | POA: Diagnosis not present

## 2019-04-04 DIAGNOSIS — Z7984 Long term (current) use of oral hypoglycemic drugs: Secondary | ICD-10-CM | POA: Diagnosis not present

## 2019-04-04 DIAGNOSIS — Z8673 Personal history of transient ischemic attack (TIA), and cerebral infarction without residual deficits: Secondary | ICD-10-CM | POA: Diagnosis not present

## 2019-04-04 DIAGNOSIS — I4891 Unspecified atrial fibrillation: Secondary | ICD-10-CM

## 2019-04-04 DIAGNOSIS — I2510 Atherosclerotic heart disease of native coronary artery without angina pectoris: Secondary | ICD-10-CM | POA: Insufficient documentation

## 2019-04-04 DIAGNOSIS — D631 Anemia in chronic kidney disease: Secondary | ICD-10-CM | POA: Diagnosis present

## 2019-04-04 DIAGNOSIS — Z515 Encounter for palliative care: Secondary | ICD-10-CM

## 2019-04-04 DIAGNOSIS — Z538 Procedure and treatment not carried out for other reasons: Secondary | ICD-10-CM | POA: Diagnosis present

## 2019-04-04 DIAGNOSIS — Z7951 Long term (current) use of inhaled steroids: Secondary | ICD-10-CM

## 2019-04-04 DIAGNOSIS — R0609 Other forms of dyspnea: Secondary | ICD-10-CM | POA: Insufficient documentation

## 2019-04-04 DIAGNOSIS — R29716 NIHSS score 16: Secondary | ICD-10-CM | POA: Diagnosis not present

## 2019-04-04 DIAGNOSIS — Z79899 Other long term (current) drug therapy: Secondary | ICD-10-CM | POA: Diagnosis not present

## 2019-04-04 DIAGNOSIS — M109 Gout, unspecified: Secondary | ICD-10-CM | POA: Diagnosis present

## 2019-04-04 DIAGNOSIS — J9602 Acute respiratory failure with hypercapnia: Secondary | ICD-10-CM | POA: Diagnosis not present

## 2019-04-04 DIAGNOSIS — R4781 Slurred speech: Secondary | ICD-10-CM | POA: Diagnosis present

## 2019-04-04 DIAGNOSIS — I10 Essential (primary) hypertension: Secondary | ICD-10-CM | POA: Diagnosis present

## 2019-04-04 DIAGNOSIS — R29707 NIHSS score 7: Secondary | ICD-10-CM | POA: Diagnosis present

## 2019-04-04 DIAGNOSIS — M79605 Pain in left leg: Secondary | ICD-10-CM

## 2019-04-04 DIAGNOSIS — D696 Thrombocytopenia, unspecified: Secondary | ICD-10-CM | POA: Diagnosis not present

## 2019-04-04 DIAGNOSIS — I5020 Unspecified systolic (congestive) heart failure: Secondary | ICD-10-CM | POA: Diagnosis present

## 2019-04-04 DIAGNOSIS — I82409 Acute embolism and thrombosis of unspecified deep veins of unspecified lower extremity: Secondary | ICD-10-CM

## 2019-04-04 DIAGNOSIS — I63232 Cerebral infarction due to unspecified occlusion or stenosis of left carotid arteries: Secondary | ICD-10-CM | POA: Diagnosis present

## 2019-04-04 DIAGNOSIS — Z888 Allergy status to other drugs, medicaments and biological substances status: Secondary | ICD-10-CM

## 2019-04-04 DIAGNOSIS — R402362 Coma scale, best motor response, obeys commands, at arrival to emergency department: Secondary | ICD-10-CM | POA: Diagnosis present

## 2019-04-04 DIAGNOSIS — Z9861 Coronary angioplasty status: Secondary | ICD-10-CM | POA: Diagnosis not present

## 2019-04-04 DIAGNOSIS — Z66 Do not resuscitate: Secondary | ICD-10-CM | POA: Diagnosis present

## 2019-04-04 DIAGNOSIS — M7989 Other specified soft tissue disorders: Secondary | ICD-10-CM

## 2019-04-04 DIAGNOSIS — I6522 Occlusion and stenosis of left carotid artery: Secondary | ICD-10-CM | POA: Diagnosis not present

## 2019-04-04 DIAGNOSIS — Z886 Allergy status to analgesic agent status: Secondary | ICD-10-CM

## 2019-04-04 DIAGNOSIS — I255 Ischemic cardiomyopathy: Secondary | ICD-10-CM | POA: Diagnosis present

## 2019-04-04 DIAGNOSIS — R0600 Dyspnea, unspecified: Secondary | ICD-10-CM

## 2019-04-04 DIAGNOSIS — Z23 Encounter for immunization: Secondary | ICD-10-CM

## 2019-04-04 DIAGNOSIS — Z20828 Contact with and (suspected) exposure to other viral communicable diseases: Secondary | ICD-10-CM | POA: Diagnosis present

## 2019-04-04 DIAGNOSIS — Z955 Presence of coronary angioplasty implant and graft: Secondary | ICD-10-CM

## 2019-04-04 DIAGNOSIS — R4701 Aphasia: Secondary | ICD-10-CM | POA: Diagnosis present

## 2019-04-04 DIAGNOSIS — E785 Hyperlipidemia, unspecified: Secondary | ICD-10-CM | POA: Diagnosis present

## 2019-04-04 DIAGNOSIS — J984 Other disorders of lung: Secondary | ICD-10-CM

## 2019-04-04 DIAGNOSIS — Z9119 Patient's noncompliance with other medical treatment and regimen: Secondary | ICD-10-CM

## 2019-04-04 DIAGNOSIS — Z79891 Long term (current) use of opiate analgesic: Secondary | ICD-10-CM

## 2019-04-04 DIAGNOSIS — N400 Enlarged prostate without lower urinary tract symptoms: Secondary | ICD-10-CM | POA: Diagnosis present

## 2019-04-04 DIAGNOSIS — N179 Acute kidney failure, unspecified: Secondary | ICD-10-CM | POA: Diagnosis not present

## 2019-04-04 DIAGNOSIS — I5043 Acute on chronic combined systolic (congestive) and diastolic (congestive) heart failure: Secondary | ICD-10-CM | POA: Diagnosis present

## 2019-04-04 DIAGNOSIS — N189 Chronic kidney disease, unspecified: Secondary | ICD-10-CM | POA: Insufficient documentation

## 2019-04-04 DIAGNOSIS — R471 Dysarthria and anarthria: Secondary | ICD-10-CM | POA: Diagnosis present

## 2019-04-04 DIAGNOSIS — Z8249 Family history of ischemic heart disease and other diseases of the circulatory system: Secondary | ICD-10-CM

## 2019-04-04 DIAGNOSIS — Z7901 Long term (current) use of anticoagulants: Secondary | ICD-10-CM

## 2019-04-04 DIAGNOSIS — I248 Other forms of acute ischemic heart disease: Secondary | ICD-10-CM | POA: Diagnosis present

## 2019-04-04 DIAGNOSIS — R32 Unspecified urinary incontinence: Secondary | ICD-10-CM | POA: Diagnosis not present

## 2019-04-04 DIAGNOSIS — R402252 Coma scale, best verbal response, oriented, at arrival to emergency department: Secondary | ICD-10-CM | POA: Diagnosis present

## 2019-04-04 DIAGNOSIS — F17210 Nicotine dependence, cigarettes, uncomplicated: Secondary | ICD-10-CM | POA: Diagnosis present

## 2019-04-04 DIAGNOSIS — I639 Cerebral infarction, unspecified: Secondary | ICD-10-CM | POA: Diagnosis present

## 2019-04-04 LAB — CBC WITH DIFFERENTIAL/PLATELET
Abs Immature Granulocytes: 0.02 10*3/uL (ref 0.00–0.07)
Abs Immature Granulocytes: 0.01 10*3/uL (ref 0.00–0.07)
Basophils Absolute: 0.1 10*3/uL (ref 0.0–0.1)
Basophils Absolute: 0.1 10*3/uL (ref 0.0–0.1)
Basophils Relative: 1 %
Basophils Relative: 1 %
Eosinophils Absolute: 0 10*3/uL (ref 0.0–0.5)
Eosinophils Absolute: 0.1 10*3/uL (ref 0.0–0.5)
Eosinophils Relative: 0 %
Eosinophils Relative: 1 %
HCT: 38.5 % — ABNORMAL LOW (ref 39.0–52.0)
HCT: 39.5 % (ref 39.0–52.0)
Hemoglobin: 12.2 g/dL — ABNORMAL LOW (ref 13.0–17.0)
Hemoglobin: 12.6 g/dL — ABNORMAL LOW (ref 13.0–17.0)
Immature Granulocytes: 0 %
Immature Granulocytes: 0 %
Lymphocytes Relative: 14 %
Lymphocytes Relative: 20 %
Lymphs Abs: 1 10*3/uL (ref 0.7–4.0)
Lymphs Abs: 1.6 10*3/uL (ref 0.7–4.0)
MCH: 28.8 pg (ref 26.0–34.0)
MCH: 28.8 pg (ref 26.0–34.0)
MCHC: 31.7 g/dL (ref 30.0–36.0)
MCHC: 31.9 g/dL (ref 30.0–36.0)
MCV: 90.8 fL (ref 80.0–100.0)
MCV: 90.4 fL (ref 80.0–100.0)
Monocytes Absolute: 0.3 10*3/uL (ref 0.1–1.0)
Monocytes Absolute: 0.5 10*3/uL (ref 0.1–1.0)
Monocytes Relative: 5 %
Monocytes Relative: 6 %
Neutro Abs: 5.5 10*3/uL (ref 1.7–7.7)
Neutro Abs: 5.8 10*3/uL (ref 1.7–7.7)
Neutrophils Relative %: 80 %
Neutrophils Relative %: 72 %
Platelets: 105 10*3/uL — ABNORMAL LOW (ref 150–400)
Platelets: 120 10*3/uL — ABNORMAL LOW (ref 150–400)
RBC: 4.24 MIL/uL (ref 4.22–5.81)
RBC: 4.37 MIL/uL (ref 4.22–5.81)
RDW: 18.5 % — ABNORMAL HIGH (ref 11.5–15.5)
RDW: 18.6 % — ABNORMAL HIGH (ref 11.5–15.5)
WBC: 6.9 10*3/uL (ref 4.0–10.5)
WBC: 8.1 10*3/uL (ref 4.0–10.5)
nRBC: 0.4 % — ABNORMAL HIGH (ref 0.0–0.2)
nRBC: 0.7 % — ABNORMAL HIGH (ref 0.0–0.2)

## 2019-04-04 LAB — URINE DRUG SCREEN, QUALITATIVE (ARMC ONLY)
Amphetamines, Ur Screen: NOT DETECTED
Barbiturates, Ur Screen: NOT DETECTED
Benzodiazepine, Ur Scrn: NOT DETECTED
Cannabinoid 50 Ng, Ur ~~LOC~~: NOT DETECTED
Cocaine Metabolite,Ur ~~LOC~~: NOT DETECTED
MDMA (Ecstasy)Ur Screen: NOT DETECTED
Methadone Scn, Ur: NOT DETECTED
Opiate, Ur Screen: NOT DETECTED
Phencyclidine (PCP) Ur S: NOT DETECTED
Tricyclic, Ur Screen: NOT DETECTED

## 2019-04-04 LAB — BASIC METABOLIC PANEL
Anion gap: 9 (ref 5–15)
BUN: 21 mg/dL (ref 8–23)
CO2: 24 mmol/L (ref 22–32)
Calcium: 9.2 mg/dL (ref 8.9–10.3)
Chloride: 104 mmol/L (ref 98–111)
Creatinine, Ser: 1.29 mg/dL — ABNORMAL HIGH (ref 0.61–1.24)
GFR calc Af Amer: >60 mL/min (ref >60)
GFR calc non Af Amer: 59 mL/min — ABNORMAL LOW (ref >60)
Glucose, Bld: 190 mg/dL — ABNORMAL HIGH (ref 70–99)
Potassium: 4.1 mmol/L (ref 3.5–5.1)
Sodium: 137 mmol/L (ref 135–145)

## 2019-04-04 LAB — URINALYSIS, COMPLETE (UACMP) WITH MICROSCOPIC
Bacteria, UA: NONE SEEN
Bilirubin Urine: NEGATIVE
Glucose, UA: NEGATIVE mg/dL
Ketones, ur: NEGATIVE mg/dL
Leukocytes,Ua: NEGATIVE
Nitrite: NEGATIVE
Protein, ur: 30 mg/dL — AB
Specific Gravity, Urine: 1.01 (ref 1.005–1.030)
pH: 5 (ref 5.0–8.0)

## 2019-04-04 LAB — COMPREHENSIVE METABOLIC PANEL
ALT: 35 U/L (ref 0–44)
AST: 30 U/L (ref 15–41)
Albumin: 4.2 g/dL (ref 3.5–5.0)
Alkaline Phosphatase: 209 U/L — ABNORMAL HIGH (ref 38–126)
Anion gap: 12 (ref 5–15)
BUN: 26 mg/dL — ABNORMAL HIGH (ref 8–23)
CO2: 24 mmol/L (ref 22–32)
Calcium: 9.8 mg/dL (ref 8.9–10.3)
Chloride: 99 mmol/L (ref 98–111)
Creatinine, Ser: 1.45 mg/dL — ABNORMAL HIGH (ref 0.61–1.24)
GFR calc Af Amer: 59 mL/min — ABNORMAL LOW (ref >60)
GFR calc non Af Amer: 51 mL/min — ABNORMAL LOW (ref >60)
Glucose, Bld: 116 mg/dL — ABNORMAL HIGH (ref 70–99)
Potassium: 3.6 mmol/L (ref 3.5–5.1)
Sodium: 135 mmol/L (ref 135–145)
Total Bilirubin: 1.8 mg/dL — ABNORMAL HIGH (ref 0.3–1.2)
Total Protein: 8.5 g/dL — ABNORMAL HIGH (ref 6.5–8.1)

## 2019-04-04 LAB — TROPONIN I (HIGH SENSITIVITY)
Troponin I (High Sensitivity): 64 ng/L — ABNORMAL HIGH (ref <18)
Troponin I (High Sensitivity): 89 ng/L — ABNORMAL HIGH (ref <18)

## 2019-04-04 LAB — ETHANOL: Alcohol, Ethyl (B): <10 mg/dL (ref <10)

## 2019-04-04 LAB — BRAIN NATRIURETIC PEPTIDE: B Natriuretic Peptide: 1358 pg/mL — ABNORMAL HIGH (ref 0.0–100.0)

## 2019-04-04 MED ORDER — INFLUENZA VAC SPLIT QUAD 0.5 ML IM SUSY
0.5000 mL | PREFILLED_SYRINGE | INTRAMUSCULAR | Status: AC
  Administered 2019-04-05: 10:00:00 0.5 mL via INTRAMUSCULAR
  Filled 2019-04-04: qty 0.5

## 2019-04-04 MED ORDER — PNEUMOCOCCAL VAC POLYVALENT 25 MCG/0.5ML IJ INJ
0.5000 mL | INJECTION | INTRAMUSCULAR | Status: AC
  Administered 2019-04-05: 0.5 mL via INTRAMUSCULAR
  Filled 2019-04-04: qty 0.5

## 2019-04-04 MED ORDER — ATORVASTATIN CALCIUM 20 MG PO TABS
80.0000 mg | ORAL_TABLET | Freq: Every day | ORAL | Status: DC
  Administered 2019-04-05 – 2019-04-15 (×10): 80 mg via ORAL
  Filled 2019-04-04 (×10): qty 4

## 2019-04-04 MED ORDER — MOMETASONE FURO-FORMOTEROL FUM 200-5 MCG/ACT IN AERO
2.0000 | INHALATION_SPRAY | Freq: Two times a day (BID) | RESPIRATORY_TRACT | Status: DC
  Administered 2019-04-05 – 2019-04-09 (×10): 2 via RESPIRATORY_TRACT
  Filled 2019-04-04: qty 8.8

## 2019-04-04 MED ORDER — STROKE: EARLY STAGES OF RECOVERY BOOK
Freq: Once | Status: AC
  Administered 2019-04-04

## 2019-04-04 MED ORDER — RIVAROXABAN 20 MG PO TABS
20.0000 mg | ORAL_TABLET | Freq: Every day | ORAL | Status: DC
  Administered 2019-04-05 – 2019-04-09 (×4): 20 mg via ORAL
  Filled 2019-04-04 (×5): qty 1

## 2019-04-04 MED ORDER — FUROSEMIDE 10 MG/ML IJ SOLN
40.0000 mg | Freq: Once | INTRAMUSCULAR | Status: AC
  Administered 2019-04-04: 09:00:00 40 mg via INTRAVENOUS
  Filled 2019-04-04: qty 4

## 2019-04-04 NOTE — ED Notes (Signed)
Patient denies pain and is resting comfortably.  

## 2019-04-04 NOTE — ED Notes (Signed)
Pt gathered all personal belongings from room and removed them prior to ED departure  

## 2019-04-04 NOTE — ED Triage Notes (Addendum)
C/O SOB/ DOe x 1 day.  Denies fever.  Also c/o productive cough.  Patient reports recent 10 lb weight gain and feeling SOB when laying flat on back, states he has had to lay on his side "for a while" to sleep. Per EMS, VSS.  SPO2 98%.

## 2019-04-04 NOTE — ED Notes (Signed)
Pt brought in via ems from home with weakness and confusion.  Pt discharged from er today at 1300.  Pt reports feeling weak all over.  Pt slow to answer question.  No headache.  Speech clear  No facial droop.  No chest pain.

## 2019-04-04 NOTE — ED Notes (Signed)
Report called to phyliss rn floor nurse 

## 2019-04-04 NOTE — ED Notes (Signed)
Pt refusing iv needle stick at his time.  md aware.

## 2019-04-04 NOTE — ED Notes (Signed)
Pt with blood pressure cuff off and pulse ox.off, watching tv     Waiting on admission.

## 2019-04-04 NOTE — H&P (Signed)
[facility] [place] NAME: [name]    MR#:  [id number]  DATE OF BIRTH:  1955/09/11  DATE OF ADMISSION:  04/04/2019  PRIMARY CARE PHYSICIAN: [facility] [place]   REQUESTING/REFERRING PHYSICIAN: Joan Mayans, MD  CHIEF COMPLAINT:   Chief Complaint  Patient presents with  . Weakness    HISTORY OF PRESENT ILLNESS:  [name]  is a 63 y.o. male who presents with chief complaint as above.  Patient presents to the ED tonight with a complaint of dysarthria and some mild right-sided weakness.  He was here earlier in the day with a complaint of shortness of breath and was treated for CHF.  His breathing is improved tonight, but he states that during his earlier ED visit he did not mention these symptoms.  CT scan in the ED shows left frontal infarct.  Hospitalist called for admission  PAST MEDICAL HISTORY:   Past Medical History:  Diagnosis Date  . CAD in native artery    a. stress echo 12/2007 abnl, EF > 55%, b. LHC 01/28/08: mLAD 30, D1 40, dLCx 61, pRCA 30, mRCA 70, mRCA lesion 2 80, PDA 90, s/p PCI/BMS to prox and distal RCA, s/p PCI/BMS to PDA; c. patient reports PCI/stenting x 2 in early 2017 at the [place] (no records on file) d. 06/2016: cath showing patent stents along RCA and LCx with moderate 40% stenosis along the LAD.   [name] Carotid arterial disease ([place])    a. 05/2018 CTA Head/neck: 80-90% stenosis @ L carotid bifurcation, extending into the prox LICA. 60% stenosis @ R carotid bifurcation. 50-75% bilateral distal cavernous carotid dzs.  . Chronic combined systolic (congestive) and diastolic (congestive) heart failure ([place])    a. 2009 Echo: > 55%; b. 06/2016: EF 25-30%; c. 12/2017 Echo: EF 30-35%, diff HK; d. 05/2018 Echo: 30-35%, mild conc LVH, diff HK. Mild MR. Mildly dil LA/RA.  [name] Chronic kidney disease   . COPD (chronic obstructive pulmonary disease) ([place])   . Diabetes mellitus without complication ([place])   . Gout   .  Hyperlipidemia   . Hypertension   . Hypertensive heart disease   . Ischemic cardiomyopathy    a. 05/2018: 30-35%.  . Obesity   . Persistent atrial fibrillation ([place]) 01/2017   a. cardioverted 9/18 to NSR; b. 12/2017 noted to be back in Afib-outpt dccv rec; c. CHA2DS2VASc = 6-->Xarelto.  . Stroke (cerebrum) ([place])    a. 05/2018 MRI: small patchy acute to subacute cortial and white matter infarct in the bilat cerebrum. Mild chronic small vessel ischmia; b. 05/2018 Carotid U/S: L-carotid bifurcation 80-90% stenosed, R- carotid bifurcation 60% stenosed.  . Tobacco abuse      PAST SURGICAL HISTORY:   Past Surgical History:  Procedure Laterality Date  . CARDIAC CATHETERIZATION  2009   [facility];   . CARDIAC CATHETERIZATION  2010  . CARDIAC CATHETERIZATION N/A 07/28/2016   Procedure: Right and Left Heart Cath and possible PCI;  Surgeon: Wellington Hampshire, MD;  Location: [facility];  Service: Cardiovascular;  Laterality: N/A;  . CARDIOVERSION N/A 03/27/2017   Procedure: CARDIOVERSION;  Surgeon: Wellington Hampshire, MD;  Location: [facility];  Service: Cardiovascular;  Laterality: N/A;  . CARDIOVERSION N/A 07/30/2018   Procedure: CARDIOVERSION;  Surgeon: Minna Merritts, MD;  Location: [facility];  Service: Cardiovascular;  Laterality: N/A;  . CORONARY ANGIOPLASTY  2009   s/p stent [facility] at [facility].     SOCIAL HISTORY:  Social History   Tobacco Use  . Smoking status: Former Smoker    Packs/day: 1.00    Years: 35.00    Pack years: 35.00    Types: Cigarettes  . Smokeless tobacco: Never Used  Substance Use Topics  . Alcohol use: Not Currently    Comment: Quit 8 yrs.  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 Heart attack Mother 46  . Hypertension Mother   . Cancer Brother      DRUG ALLERGIES:   Allergies  Allergen Reactions  . Aspirin Other (See Comments)    Told not to take because of blood thinner  . Entresto  [Sacubitril-Valsartan] Other (See Comments)    Dizziness, weakness and stuttering  . Ibuprofen Nausea And Vomiting  . Tylenol [Acetaminophen] Nausea And Vomiting    MEDICATIONS AT HOME:   Prior to Admission medications   Medication Sig Start Date End Date Taking? Authorizing Provider  albuterol-ipratropium (COMBIVENT) 18-103 MCG/ACT inhaler Inhale 2 puffs into the lungs every 4 (four) hours as needed for wheezing.    [provider]  atorvastatin (LIPITOR) 80 MG tablet Take 80 mg by mouth daily.    [provider]  budesonide-formoterol (SYMBICORT) 160-4.5 MCG/ACT inhaler Inhale 2 puffs into the lungs 2 (two) times daily.    [provider]  carvedilol (COREG) 6.25 MG tablet Take 1 tablet (6.25 mg total) by mouth 2 (two) times daily with a meal. 07/31/18   Altamese Dilling, MD  docusate sodium (COLACE) 100 MG capsule Take 1 capsule (100 mg total) by mouth 2 (two) times daily. 09/02/18   Clarisa Kindred A, FNP  magnesium oxide (MAG-OX) 400 MG tablet Take 400 mg by mouth 2 (two) times daily.    [provider]  methocarbamol (ROBAXIN) 500 MG tablet Take 500 mg by mouth 4 (four) times daily.    [provider]  nitroGLYCERIN (NITROSTAT) 0.4 MG SL tablet Place 0.4 mg under the tongue every 5 (five) minutes as needed for chest pain.    [provider]  oxyCODONE (OXY IR/ROXICODONE) 5 MG immediate release tablet Take 5 mg by mouth 2 (two) times a day. 01/14/19   [provider]  potassium chloride SA (K-DUR,KLOR-CON) 20 MEQ tablet Take 20 mEq by mouth daily.    [provider]  rivaroxaban (XARELTO) 15 MG TABS tablet Take 1 tablet (15 mg total) by mouth daily with supper. Patient taking differently: Take 20 mg by mouth daily with supper.  08/20/18   Enedina Finner, MD  spironolactone (ALDACTONE) 25 MG tablet Take 1 tablet (25 mg total) by mouth daily. 09/09/18   Dunn, Raymon Mutton, PA-C  torsemide (DEMADEX) 20 MG tablet Take 2 tablets (40  mg total) by mouth 2 (two) times daily. 01/26/19 03/14/19  Ihor Austin, MD  traZODone (DESYREL) 50 MG tablet Take 50 mg by mouth at bedtime.    [provider]    REVIEW OF SYSTEMS:  Review of Systems  Constitutional: Negative for chills, fever, malaise/fatigue and weight loss.  HENT: Negative for ear pain, hearing loss and tinnitus.   Eyes: Negative for blurred vision, double vision, pain and redness.  Respiratory: Negative for cough, hemoptysis and shortness of breath.   Cardiovascular: Negative for chest pain, palpitations, orthopnea and leg swelling.  Gastrointestinal: Negative for abdominal pain, constipation, diarrhea, nausea and vomiting.  Genitourinary: Negative for dysuria, frequency and hematuria.  Musculoskeletal: Negative for back pain, joint  pain and neck pain.  Skin:       No acne, rash, or lesions  Neurological: Positive for speech change and focal weakness. Negative for dizziness, tremors and weakness.  Endo/Heme/Allergies: Negative for polydipsia. Does not bruise/bleed easily.  Psychiatric/Behavioral: Negative for depression. The patient is not nervous/anxious and does not have insomnia.      VITAL SIGNS:   Vitals:   04/04/19 1850 04/04/19 1945 04/04/19 1950 04/04/19 1952  BP:  (!) 135/94    Pulse: 90  91 93  Resp: 17     Temp:      TempSrc:      SpO2: 100%  98% 97%   Wt Readings from Last 3 Encounters:  04/04/19 99.8 kg  03/14/19 97.5 kg  03/04/19 94.8 kg    PHYSICAL EXAMINATION:  Physical Exam  Vitals reviewed. Constitutional: He is oriented to person, place, and time. He appears well-developed and well-nourished. No distress.  HENT:  Head: Normocephalic and atraumatic.  Mouth/Throat: Oropharynx is clear and moist.  Eyes: Pupils are equal, round, and reactive to light. Conjunctivae and EOM are normal. No scleral icterus.  Neck: Normal range of motion. Neck supple. No JVD present. No thyromegaly present.  Cardiovascular: Normal rate, regular  rhythm and intact distal pulses. Exam reveals no gallop and no friction rub.  No murmur heard. Respiratory: Effort normal and breath sounds normal. No respiratory distress. He has no wheezes. He has no rales.  GI: Soft. Bowel sounds are normal. He exhibits no distension. There is no abdominal tenderness.  Musculoskeletal: Normal range of motion.        General: No edema.     Comments: No arthritis, no gout  Lymphadenopathy:    He has no cervical adenopathy.  Neurological: He is alert and oriented to person, place, and time. No cranial nerve deficit.  Neurologic: Cranial nerves II-XII intact, Sensation intact to light touch/pinprick, 5/5 strength in left extremities, 4/5 strength in right extremities, no overt severe dysarthria - though patient endorses persistent mild speech changes, no aphasia, no dysphagia, memory intact, finger to nose testing showed no abnormality  Skin: Skin is warm and dry. No rash noted. No erythema.  Psychiatric: He has a normal mood and affect. His behavior is normal. Judgment and thought content normal.    LABORATORY PANEL:   CBC Recent Labs  Lab 04/04/19 1937  WBC 8.1  HGB 12.6*  HCT 39.5  PLT 120*   ------------------------------------------------------------------------------------------------------------------  Chemistries  Recent Labs  Lab 04/04/19 2041  NA 135  K 3.6  CL 99  CO2 24  GLUCOSE 116*  BUN 26*  CREATININE 1.45*  CALCIUM 9.8  AST 30  ALT 35  ALKPHOS 209*  BILITOT 1.8*   ------------------------------------------------------------------------------------------------------------------  Cardiac Enzymes No results for input(s): TROPONINI in the last 168 hours. ------------------------------------------------------------------------------------------------------------------  RADIOLOGY:  Dg Chest Port 1 View  Result Date: 04/04/2019 CLINICAL DATA:  Weakness and confusion. EXAM: PORTABLE CHEST 1 VIEW COMPARISON:  Radiographs  04/04/2019 and 03/04/2019. FINDINGS: 1853 hours. There is stable cardiomegaly and aortic atherosclerosis. Chronic interstitial prominence is similar to the study done earlier today and before. There is no superimposed airspace disease, pleural effusion or pneumothorax. The bones appear unchanged. IMPRESSION: Stable examination with cardiomegaly, vascular congestion and chronic interstitial prominence. No new findings. Electronically Signed   By: Carey BullocksWilliam  Veazey M.D.   On: 04/04/2019 19:23   Dg Chest Portable 1 View  Result Date: 04/04/2019 CLINICAL DATA:  Patient with cough and shortness of breath EXAM: PORTABLE CHEST  1 VIEW COMPARISON:  Chest radiograph 03/04/2019 FINDINGS: Monitoring leads overlie the patient. Stable cardiomegaly. Aortic atherosclerosis. No large area of pulmonary consolidation. Similar mild interstitial opacities. No pleural effusion or pneumothorax. Thoracic spine degenerative changes. IMPRESSION: Cardiomegaly. Similar mild interstitial opacities may represent mild edema Electronically Signed   By: Annia Belt M.D.   On: 04/04/2019 08:10    EKG:   Orders placed or performed during the hospital encounter of 04/04/19  . EKG 12-Lead  . EKG 12-Lead    IMPRESSION AND PLAN:  Principal Problem:   Stroke [facility]) -area concerning for stroke seen on CT head.  Admit to the hospital per stroke admission order set with appropriate labs, imaging, and consults including neurology consult.  Permissive hypertension tonight, see below for blood pressure goal Active Problems:   Persistent atrial fibrillation (HCC) -continue home meds including anticoagulation   COPD (chronic obstructive pulmonary disease) (HCC) -continue home medications   CAD in native artery -continue home meds   HFrEF (heart failure with reduced ejection raction) (HCC) -patient's breathing is improved from this morning.  Continue home meds   HTN (hypertension) -hold antihypertensives for now as the patient will be on  permissive hypertension tonight, blood pressure goal less than 220/120   DM (diabetes mellitus) (HCC) -sliding scale insulin   HLD (hyperlipidemia) -home dose antilipid  Chart review performed and case discussed with ED provider. Labs, imaging and/or ECG reviewed by provider and discussed with patient/family. Management plans discussed with the patient and/or family.  COVID-19 status: Pending  DVT PROPHYLAXIS: Systemic anticoagulation  GI PROPHYLAXIS:  None  ADMISSION STATUS: Inpatient     CODE STATUS: Full Code Status History    Date Active Date Inactive Code Status Order ID Comments User Context   01/22/2019 0348 01/26/2019 1621 Full Code [id number]  Arnaldo Natal, MD Inpatient   08/18/2018 1028 08/20/2018 1510 Full Code [id number]  Adrian Saran, MD ED   07/26/2018 1207 07/31/2018 1751 Partial Code [id number]  Erin Fulling, MD Inpatient   07/25/2018 2020 07/26/2018 1207 Full Code [id number]  Gery Pray, MD Inpatient   06/13/2018 2049 06/15/2018 1706 Full Code [id number]  Gery Pray, MD Inpatient   01/19/2018 2358 01/21/2018 1343 Full Code [id number]  Cammy Copa, MD ED   03/26/2017 0636 03/27/2017 1729 Full Code [id number]  Arnaldo Natal, MD Inpatient   02/18/2017 2112 02/19/2017 1707 Full Code [id number]  Houston Siren, MD Inpatient   01/05/2017 2246 01/07/2017 1614 Full Code [id number]  Altamese Dilling, MD Inpatient   07/24/2016 0235 07/31/2016 1401 Full Code [id number]  Oralia Manis, MD Inpatient   Advance Care Planning Activity      TOTAL TIME TAKING CARE OF THIS PATIENT: 45 minutes.   This patient was evaluated in the context of the global COVID-19 pandemic, which necessitated consideration that the patient might be at risk for infection with the SARS-CoV-2 virus that causes COVID-19. Institutional protocols and algorithms that pertain to the evaluation of patients at risk for COVID-19 are in a state of rapid change based on information released by regulatory bodies including  the CDC and federal and state organizations. These policies and algorithms were followed to the best of this provider's knowledge to date during the patient's care at this facility.  Barney Drain 04/04/2019, 9:46 PM  [facility]  Office  [phone]  CC: Primary care physician; [facility]  Note:  This document was prepared using Conservation officer, historic buildings and may include unintentional dictation errors.

## 2019-04-04 NOTE — ED Notes (Addendum)
Pt ate dinner tray.  Pt dry and alert when leaving the er.

## 2019-04-04 NOTE — ED Notes (Signed)
OOB to bathroom.  Tolerated well.

## 2019-04-04 NOTE — ED Provider Notes (Signed)
[facility] Emergency Department Provider Note  ____________________________________________   First MD Initiated Contact with Patient 04/04/19 1834     (approximate)  I have reviewed the triage vital signs and the nursing notes.  History  Chief Complaint Weakness    HPI [name] is a 63 y.o. male with history of CAD, CHF, HTN, HLD, atrial fibrillation on Xarelto, CVA who presents to the ED for right sided weakness and subjective slurred speech.   Patient was just seen in this ED earlier today for SOB, found to have acute on chronic exacerbation of his heart failure. Received IV Lasix and discharged with plan to follow up in heart failure clinic. He states after leaving the emergency department he developed some slurred speech and right-sided weakness.  His home health nurse checked on him and was concerned he may be experiencing a stroke, which prompted him to return to the emergency department.  Patient states his symptoms started around 1 PM today.  He is on anticoagulation for his history of atrial fibrillation.  He reports a history of prior strokes with similar symptoms.  He denies any recent illnesses, no vomiting, diarrhea, urinary symptoms.   Past Medical Hx Past Medical History:  Diagnosis Date  . CAD in native artery    a. stress echo 12/2007 abnl, EF > 55%, b. LHC 01/28/08: mLAD 30, D1 40, dLCx 70, pRCA 30, mRCA 70, mRCA lesion 2 80, PDA 90, s/p PCI/BMS to prox and distal RCA, s/p PCI/BMS to PDA; c. patient reports PCI/stenting x 2 in early 2017 at the [place] (no records on file) d. 06/2016: cath showing patent stents along RCA and LCx with moderate 40% stenosis along the LAD.   [name] Carotid arterial disease (HCC)    a. 05/2018 CTA Head/neck: 80-90% stenosis @ L carotid bifurcation, extending into the prox LICA. 60% stenosis @ R carotid bifurcation. 50-75% bilateral distal cavernous carotid dzs.  . Chronic combined systolic (congestive) and diastolic  (congestive) heart failure (HCC)    a. 2009 Echo: > 55%; b. 06/2016: EF 25-30%; c. 12/2017 Echo: EF 30-35%, diff HK; d. 05/2018 Echo: 30-35%, mild conc LVH, diff HK. Mild MR. Mildly dil LA/RA.  [name] Chronic kidney disease   . COPD (chronic obstructive pulmonary disease) (HCC)   . Diabetes mellitus without complication (HCC)   . Gout   . Hyperlipidemia   . Hypertension   . Hypertensive heart disease   . Ischemic cardiomyopathy    a. 05/2018: 30-35%.  . Obesity   . Persistent atrial fibrillation (HCC) 01/2017   a. cardioverted 9/18 to NSR; b. 12/2017 noted to be back in Afib-outpt dccv rec; c. CHA2DS2VASc = 6-->Xarelto.  . Stroke (cerebrum) (HCC)    a. 05/2018 MRI: small patchy acute to subacute cortial and white matter infarct in the bilat cerebrum. Mild chronic small vessel ischmia; b. 05/2018 Carotid U/S: L-carotid bifurcation 80-90% stenosed, R- carotid bifurcation 60% stenosed.  . Tobacco abuse     Problem List Patient Active Problem List   Diagnosis Date Noted  . Persistent atrial fibrillation (HCC)   . CHF (congestive heart failure) (HCC) 01/23/2019  . Acute on chronic systolic CHF (congestive heart failure) (HCC) 01/22/2019  . HTN (hypertension) 09/02/2018  . DM (diabetes mellitus) (HCC) 09/02/2018  . SOB (shortness of breath) 08/18/2018  . HFrEF (heart failure with reduced ejection fraction) (HCC)   . Lactic acidosis 07/25/2018  . Hyperbilirubinemia 07/25/2018  . Carotid stenosis 06/13/2018  . Elevated troponin 06/13/2018  . Atrial  fibrillation, chronic (HCC) 06/13/2018  . TIA (transient ischemic attack) 06/13/2018  . TIA involving carotid artery 06/13/2018  . Atrial flutter with rapid ventricular response (HCC) 02/18/2017  . CAD in native artery   . Chest pain 01/05/2017  . Ischemic cardiomyopathy 07/26/2016  . Non-sustained ventricular tachycardia (HCC) 07/26/2016  . COPD (chronic obstructive pulmonary disease) (HCC) 07/24/2016  . CAD (coronary artery disease) 07/24/2016   . HLD (hyperlipidemia) 07/24/2016  . Tobacco abuse 07/24/2016  . Hypertensive heart disease 07/24/2016    Past Surgical Hx Past Surgical History:  Procedure Laterality Date  . CARDIAC CATHETERIZATION  2009   [facility];   . CARDIAC CATHETERIZATION  2010  . CARDIAC CATHETERIZATION N/A 07/28/2016   Procedure: Right and Left Heart Cath and possible PCI;  Surgeon: Iran OuchMuhammad A Arida, MD;  Location: [facility] INVASIVE CV LAB;  Service: Cardiovascular;  Laterality: N/A;  . CARDIOVERSION N/A 03/27/2017   Procedure: CARDIOVERSION;  Surgeon: Iran OuchArida, Muhammad A, MD;  Location: [facility] ORS;  Service: Cardiovascular;  Laterality: N/A;  . CARDIOVERSION N/A 07/30/2018   Procedure: CARDIOVERSION;  Surgeon: Antonieta IbaGollan, Timothy J, MD;  Location: [facility] ORS;  Service: Cardiovascular;  Laterality: N/A;  . CORONARY ANGIOPLASTY  2009   [facility]    Medications Prior to Admission medications   Medication Sig Start Date End Date Taking? Authorizing Provider  albuterol-ipratropium (COMBIVENT) 18-103 MCG/ACT inhaler Inhale 2 puffs into the lungs every 4 (four) hours as needed for wheezing.    [provider]  atorvastatin (LIPITOR) 80 MG tablet Take 80 mg by mouth daily.    [provider]  budesonide-formoterol (SYMBICORT) 160-4.5 MCG/ACT inhaler Inhale 2 puffs into the lungs 2 (two) times daily.    [provider]  carvedilol (COREG) 6.25 MG tablet Take 1 tablet (6.25 mg total) by mouth 2 (two) times daily with a meal. 07/31/18   Altamese DillingVachhani, Vaibhavkumar, MD  docusate sodium (COLACE) 100 MG capsule Take 1 capsule (100 mg total) by mouth 2 (two) times daily. 09/02/18   Clarisa KindredHackney, Tina A, FNP  magnesium oxide (MAG-OX) 400 MG tablet Take 400 mg by mouth 2 (two) times daily.    [provider]  methocarbamol (ROBAXIN) 500 MG tablet Take 500 mg by mouth 4 (four) times daily.    [provider]  nitroGLYCERIN (NITROSTAT) 0.4 MG SL tablet Place 0.4 mg under the tongue every 5 (five)  minutes as needed for chest pain.    [provider]  oxyCODONE (OXY IR/ROXICODONE) 5 MG immediate release tablet Take 5 mg by mouth 2 (two) times a day. 01/14/19   [provider]  potassium chloride SA (K-DUR,KLOR-CON) 20 MEQ tablet Take 20 mEq by mouth daily.    [provider]  rivaroxaban (XARELTO) 15 MG TABS tablet Take 1 tablet (15 mg total) by mouth daily with supper. Patient taking differently: Take 20 mg by mouth daily with supper.  08/20/18   Enedina FinnerPatel, Sona, MD  spironolactone (ALDACTONE) 25 MG tablet Take 1 tablet (25 mg total) by mouth daily. 09/09/18   Dunn, Raymon Muttonyan M, PA-C  torsemide (DEMADEX) 20 MG tablet Take 2 tablets (40 mg total) by mouth 2 (two) times daily. 01/26/19 03/14/19  Ihor AustinPyreddy, Pavan, MD  traZODone (DESYREL) 50 MG tablet Take 50 mg by mouth at bedtime.    [provider]    Allergies Aspirin, Entresto [sacubitril-valsartan], Ibuprofen, and Tylenol [acetaminophen]  Family Hx Family History  Problem Relation Age of Onset  . Heart attack Mother 2155  . Hypertension Mother   .  Cancer Brother     Social Hx Social History   Tobacco Use  . Smoking status: Former Smoker    Packs/day: 1.00    Years: 35.00    Pack years: 35.00    Types: Cigarettes  . Smokeless tobacco: Never Used  Substance Use Topics  . Alcohol use: Not Currently    Comment: Quit 8 yrs.  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 Drug use: Yes    Comment: prescribed oxy     Review of Systems  Constitutional: Negative for fever, chills. Eyes: Negative for visual changes. ENT: Negative for sore throat. Cardiovascular: Negative for chest pain. Respiratory: Negative for shortness of breath. Gastrointestinal: Negative for nausea, vomiting.  Genitourinary: Negative for dysuria. Musculoskeletal: Negative for leg swelling. Skin: Negative for rash. Neurological: + weakness, slurred speech   Physical Exam  Vital Signs: ED Triage Vitals [04/04/19 1826]  Enc Vitals Group     BP  (!) 150/107     Pulse Rate 95     Resp 20     Temp 98.2 F (36.8 C)     Temp Source Oral     SpO2 96 %     Weight      Height      Head Circumference      Peak Flow      Pain Score      Pain Loc      Pain Edu?      Excl. in [place]?     Constitutional: Alert and oriented.  Appears older than stated age. Head: Normocephalic. Atraumatic.  Face symmetric. Eyes: Conjunctivae clear. Sclera anicteric. Nose: No congestion. No rhinorrhea. Mouth/Throat: Mucous membranes are moist.  Tongue midline. Neck: No stridor.   Cardiovascular: Normal rate. Extremities well perfused. Respiratory: Normal respiratory effort.   Gastrointestinal: Non-distended.  Musculoskeletal: Trace symmetric edema to mid/upper shins bilaterally Neurologic: Alert and oriented.  Face is symmetric.  His speech seems clear to me, but he says it is subjectively different.  He does have subtle right upper extremity weakness compared to the left, 4/5 versus 5/5.  No obvious weakness of the lower extremities.  No issues with sensation. Skin: Skin is warm, dry and intact. No rash noted. Psychiatric: Mood and affect are appropriate for situation.  EKG  Personally reviewed.   Rate: WNL Rhythm: sinus Axis: LAD Intervals: AV block Appears unchanged from prior No STEMI    Radiology  CT, as dictated by radiology, unfortunately there is an issue with the computer system and the official read is not crossing over: - Focal hypodensity within left frontal white matter, suspicious for acute to subacute infarct. Negative for hemorrhage.     Procedures  Procedure(s) performed (including critical care):  Procedures   Initial Impression / Assessment and Plan / ED Course  63 y.o. male who presents to the ED for subjective slurred speech and R sided weakness. Patient states symptoms started around 1 PM (arrives here ~6:20 PM). He is not a tPA candidate due to timeline as well as status on anticoagulation.   His speech seems  fairly clear to me, but he states it is subjectively different than normal. He has subtle RUE weakness compared to left 4/5 versus 5/5.   Ddx includes CVA, underlying infection/electrolyte etiology w/ recrudescence of prior stroke symptoms. Will obtain labs, imaging.   CT c/w acute to subacute infarct. Will plan to admit, patient is agreeable. Discussed w/ hospitalist.    Final Clinical Impression(s) / ED Diagnosis  Final diagnoses:  Weakness  Cerebrovascular accident (CVA), unspecified mechanism (HCC)       Note:  This document was prepared using Dragon voice recognition software and may include unintentional dictation errors.   Miguel Aschoff., MD 04/05/19 [phone]

## 2019-04-04 NOTE — ED Provider Notes (Signed)
[facility] Emergency Department Provider Note   ____________________________________________   First MD Initiated Contact with Patient 04/04/19 [phone]     (approximate)  I have reviewed the triage vital signs and the nursing notes.   HISTORY  Chief Complaint Shortness of Breath    HPI [name] is a 63 y.o. male with past medical history of CAD, CHF, COPD, diabetes, and A. fib on Xarelto who presents to the ED complaining of shortness of breath.  Patient reports he has had increasing shortness of breath over approximately the past day, which seems to be worse with exertion or lying flat.  He states he has recently gained 10 pounds and has had to sleep on his side.  Symptoms have been associated with productive cough, but he denies any fevers or chest pain.  He states he has been compliant with all of his medications and avoiding salty foods.  He has been using his inhaler at home without relief.        Past Medical History:  Diagnosis Date   CAD in native artery    a. stress echo 12/2007 abnl, EF > 55%, b. LHC 01/28/08: mLAD 30, D1 40, dLCx 68, pRCA 30, mRCA 70, mRCA lesion 2 80, PDA 90, s/p PCI/BMS to prox and distal RCA, s/p PCI/BMS to PDA; c. patient reports PCI/stenting x 2 in early 2017 at the [place] (no records on file) d. 06/2016: cath showing patent stents along RCA and LCx with moderate 40% stenosis along the LAD.    Carotid arterial disease ([place])    a. 05/2018 CTA Head/neck: 80-90% stenosis @ L carotid bifurcation, extending into the prox LICA. 60% stenosis @ R carotid bifurcation. 50-75% bilateral distal cavernous carotid dzs.   Chronic combined systolic (congestive) and diastolic (congestive) heart failure ([place])    a. 2009 Echo: > 55%; b. 06/2016: EF 25-30%; c. 12/2017 Echo: EF 30-35%, diff HK; d. 05/2018 Echo: 30-35%, mild conc LVH, diff HK. Mild MR. Mildly dil LA/RA.   Chronic kidney disease    COPD (chronic obstructive pulmonary disease) (HCC)     Diabetes mellitus without complication ([place])    Gout    Hyperlipidemia    Hypertension    Hypertensive heart disease    Ischemic cardiomyopathy    a. 05/2018: 30-35%.   Obesity    Persistent atrial fibrillation ([place]) 01/2017   a. cardioverted 9/18 to NSR; b. 12/2017 noted to be back in Afib-outpt dccv rec; c. CHA2DS2VASc = 6-->Xarelto.   Stroke (cerebrum) ([place])    a. 05/2018 MRI: small patchy acute to subacute cortial and white matter infarct in the bilat cerebrum. Mild chronic small vessel ischmia; b. 05/2018 Carotid U/S: L-carotid bifurcation 80-90% stenosed, R- carotid bifurcation 60% stenosed.   Tobacco abuse     Patient Active Problem List   Diagnosis Date Noted   Persistent atrial fibrillation (HCC)    CHF (congestive heart failure) ([place]) 01/23/2019   Acute on chronic systolic CHF (congestive heart failure) ([place]) 01/22/2019   HTN (hypertension) 09/02/2018   DM (diabetes mellitus) ([place]) 09/02/2018   SOB (shortness of breath) 08/18/2018   HFrEF (heart failure with reduced ejection fraction) (HCC)    Lactic acidosis 07/25/2018   Hyperbilirubinemia 07/25/2018   Carotid stenosis 06/13/2018   Elevated troponin 06/13/2018   Atrial fibrillation, chronic ([place]) 06/13/2018   TIA (transient ischemic attack) 06/13/2018   TIA involving carotid artery 06/13/2018   Atrial flutter with rapid ventricular response ([place]) 02/18/2017   CAD in native artery  Chest pain 01/05/2017   Ischemic cardiomyopathy 07/26/2016   Non-sustained ventricular tachycardia (HCC) 07/26/2016   COPD (chronic obstructive pulmonary disease) (HCC) 07/24/2016   CAD (coronary artery disease) 07/24/2016   HLD (hyperlipidemia) 07/24/2016   Tobacco abuse 07/24/2016   Hypertensive heart disease 07/24/2016    Past Surgical History:  Procedure Laterality Date   CARDIAC CATHETERIZATION  2009   [facility];    CARDIAC CATHETERIZATION  2010   CARDIAC CATHETERIZATION N/A 07/28/2016    Procedure: Right and Left Heart Cath and possible PCI;  Surgeon: Iran Ouch, MD;  Location: [facility] INVASIVE CV LAB;  Service: Cardiovascular;  Laterality: N/A;   CARDIOVERSION N/A 03/27/2017   Procedure: CARDIOVERSION;  Surgeon: Iran Ouch, MD;  Location: [facility] ORS;  Service: Cardiovascular;  Laterality: N/A;   CARDIOVERSION N/A 07/30/2018   Procedure: CARDIOVERSION;  Surgeon: Antonieta Iba, MD;  Location: [facility] ORS;  Service: Cardiovascular;  Laterality: N/A;   CORONARY ANGIOPLASTY  2009   s/p stent placement at [facility].    Prior to Admission medications   Medication Sig Start Date End Date Taking? Authorizing Provider  albuterol-ipratropium (COMBIVENT) 18-103 MCG/ACT inhaler Inhale 2 puffs into the lungs every 4 (four) hours as needed for wheezing.    [provider]  atorvastatin (LIPITOR) 80 MG tablet Take 80 mg by mouth daily.    [provider]  budesonide-formoterol (SYMBICORT) 160-4.5 MCG/ACT inhaler Inhale 2 puffs into the lungs 2 (two) times daily.    [provider]  carvedilol (COREG) 6.25 MG tablet Take 1 tablet (6.25 mg total) by mouth 2 (two) times daily with a meal. 07/31/18   Altamese Dilling, MD  docusate sodium (COLACE) 100 MG capsule Take 1 capsule (100 mg total) by mouth 2 (two) times daily. 09/02/18   Clarisa Kindred A, FNP  magnesium oxide (MAG-OX) 400 MG tablet Take 400 mg by mouth 2 (two) times daily.    [provider]  methocarbamol (ROBAXIN) 500 MG tablet Take 500 mg by mouth 4 (four) times daily.    [provider]  nitroGLYCERIN (NITROSTAT) 0.4 MG SL tablet Place 0.4 mg under the tongue every 5 (five) minutes as needed for chest pain.    [provider]  oxyCODONE (OXY IR/ROXICODONE) 5 MG immediate release tablet Take 5 mg by mouth 2 (two) times a day. 01/14/19   [provider]  potassium chloride SA (K-DUR,KLOR-CON) 20 MEQ tablet Take 20 mEq by mouth daily.    [provider]    rivaroxaban (XARELTO) 15 MG TABS tablet Take 1 tablet (15 mg total) by mouth daily with supper. Patient taking differently: Take 20 mg by mouth daily with supper.  08/20/18   Enedina Finner, MD  spironolactone (ALDACTONE) 25 MG tablet Take 1 tablet (25 mg total) by mouth daily. 09/09/18   Dunn, Raymon Mutton, PA-C  torsemide (DEMADEX) 20 MG tablet Take 2 tablets (40 mg total) by mouth 2 (two) times daily. 01/26/19 03/14/19  Ihor Austin, MD  traZODone (DESYREL) 50 MG tablet Take 50 mg by mouth at bedtime.    [provider]    Allergies Aspirin, Entresto [sacubitril-valsartan], Ibuprofen, and Tylenol [acetaminophen]  Family History  Problem Relation Age of Onset   Heart attack Mother 59   Hypertension Mother    Cancer Brother     Social History Social History   Tobacco Use   Smoking status: Former Smoker    Packs/day: 1.00    Years: 35.00    Pack years: 35.00  Types: Cigarettes   Smokeless tobacco: Never Used  Substance Use Topics   Alcohol use: Not Currently    Comment: Quit 8 yrs.  Used to drink heavily   Drug use: Yes    Comment: prescribed oxy    Review of Systems  Constitutional: No fever/chills Eyes: No visual changes. ENT: No sore throat. Cardiovascular: Denies chest pain. Respiratory: Positive for shortness of breath.  Positive for cough. Gastrointestinal: No abdominal pain.  No nausea, no vomiting.  No diarrhea.  No constipation. Genitourinary: Negative for dysuria. Musculoskeletal: Negative for back pain.  Positive for leg swelling. Skin: Negative for rash. Neurological: Negative for headaches, focal weakness or numbness.  ____________________________________________   PHYSICAL EXAM:  VITAL SIGNS: ED Triage Vitals  Enc Vitals Group     BP      Pulse      Resp      Temp      Temp src      SpO2      Weight      Height      Head Circumference      Peak Flow      Pain Score      Pain Loc      Pain Edu?      Excl. in [place]?      Constitutional: Alert and oriented. Eyes: Conjunctivae are normal. Head: Atraumatic. Nose: No congestion/rhinnorhea. Mouth/Throat: Mucous membranes are moist. Neck: Normal ROM Cardiovascular: Normal rate, regular rhythm. Grossly normal heart sounds. Respiratory: Normal respiratory effort.  No retractions. Lungs with crackles at bases, no wheezing. Gastrointestinal: Soft and nontender. No distention. Genitourinary: deferred Musculoskeletal: Trace pitting edema to bilateral lower extremities up to knees with no associated tenderness. Neurologic:  Normal speech and language. No gross focal neurologic deficits are appreciated. Skin:  Skin is warm, dry and intact. No rash noted. Psychiatric: Mood and affect are normal. Speech and behavior are normal.  ____________________________________________   LABS (all labs ordered are listed, but only abnormal results are displayed)  Labs Reviewed  BASIC METABOLIC PANEL - Abnormal; Notable for the following components:      Result Value   Glucose, Bld 190 (*)    Creatinine, Ser 1.29 (*)    GFR calc non Af Amer 59 (*)    All other components within normal limits  CBC WITH DIFFERENTIAL/PLATELET - Abnormal; Notable for the following components:   Hemoglobin 12.2 (*)    HCT 38.5 (*)    RDW 18.5 (*)    Platelets 105 (*)    nRBC 0.4 (*)    All other components within normal limits  BRAIN NATRIURETIC PEPTIDE - Abnormal; Notable for the following components:   B Natriuretic Peptide 1,358.0 (*)    All other components within normal limits  TROPONIN I (HIGH SENSITIVITY) - Abnormal; Notable for the following components:   Troponin I (High Sensitivity) 64 (*)    All other components within normal limits   ____________________________________________  EKG  ED ECG REPORT I, Chesley Noon, the attending physician, personally viewed and interpreted this ECG.   Date: 04/04/2019  EKG Time: 7:38  Rate: 88  Rhythm: normal sinus rhythm  Axis:  LAD  Intervals:first-degree A-V block   ST&T Change: Q waves inferiorly and anteriorly, unchanged, T wave inversions laterally    PROCEDURES  Procedure(s) performed (including Critical Care):  Procedures   ____________________________________________   INITIAL IMPRESSION / ASSESSMENT AND PLAN / ED COURSE       63 year old male with history of CAD, COPD,  and CHF presents to the ED with worsening shortness of breath over the past 24 to 48 hours which is associated with a cough.  No wheezing noted on exam, given weight gain along with swelling in his legs and orthopnea, suspect CHF exacerbation.  EKG similar to prior, doubt ACS, will screen troponin.  Will check chest x-ray, anticipate diuresis here in the ED with close follow-up with the heart failure clinic.  Per chart, he has missed multiple appointments in the heart failure clinic recently.  Work-up consistent with mild CHF exacerbation given findings on chest x-ray and elevated BNP.  Troponin is elevated, however this is comparable to prior and likely related to CHF rather than ACS.  Patient given dose of Lasix and counseled to double up on Lasix for the next couple of days.  Counseled patient to follow-up in the heart failure clinic and return to the ED for new or worsening symptoms, patient agrees with plan.      ____________________________________________   FINAL CLINICAL IMPRESSION(S) / ED DIAGNOSES  Final diagnoses:  Acute on chronic systolic congestive heart failure (HCC)  Dyspnea on exertion     ED Discharge Orders         Ordered    AMB referral to CHF clinic     04/04/19 0946           Note:  This document was prepared using Dragon voice recognition software and may include unintentional dictation errors.   Chesley NoonJessup, Mitra Duling, MD 04/04/19 [phone]

## 2019-04-04 NOTE — ED Notes (Signed)
Pt requesting to speak with EDP. EDP notified

## 2019-04-04 NOTE — ED Triage Notes (Signed)
Pt brought in via ems from home.  Pt sent to er for eval of weakness and confusion.   Pt was discharged from er at 1300 today.  Pt alert on arrival.

## 2019-04-04 NOTE — Discharge Instructions (Signed)
You were seen today in the ER for shortness of breath, which appears related to your heart failure.  Please take double your usual dose of fluid pill for the next 2 to 3 days until your weight returns to normal.  Please schedule follow-up in the heart failure clinic as soon as possible and return to the ER for new or worsening symptoms.

## 2019-04-05 ENCOUNTER — Encounter (HOSPITAL_COMMUNITY): Payer: Self-pay

## 2019-04-05 ENCOUNTER — Inpatient Hospital Stay: Payer: No Typology Code available for payment source

## 2019-04-05 ENCOUNTER — Inpatient Hospital Stay
Admit: 2019-04-05 | Discharge: 2019-04-05 | Disposition: A | Discharge status: Home / Self Care | Payer: No Typology Code available for payment source | Attending: Internal Medicine | Admitting: Internal Medicine

## 2019-04-05 DIAGNOSIS — I63232 Cerebral infarction due to unspecified occlusion or stenosis of left carotid arteries: Secondary | ICD-10-CM | POA: Diagnosis not present

## 2019-04-05 LAB — LIPID PANEL
Cholesterol: 167 mg/dL (ref 0–200)
HDL: 56 mg/dL (ref >40)
LDL Cholesterol: 86 mg/dL (ref 0–99)
Total CHOL/HDL Ratio: 3 RATIO
Triglycerides: 126 mg/dL (ref <150)
VLDL: 25 mg/dL (ref 0–40)

## 2019-04-05 LAB — URINE CULTURE: Culture: NO GROWTH

## 2019-04-05 LAB — ECHOCARDIOGRAM COMPLETE
Height: 71 in
Weight: 3520.31 oz

## 2019-04-05 LAB — HEMOGLOBIN A1C
Hgb A1c MFr Bld: 8.6 % — ABNORMAL HIGH (ref 4.8–5.6)
Mean Plasma Glucose: 200.12 mg/dL

## 2019-04-05 LAB — SARS CORONAVIRUS 2 (TAT 6-24 HRS): SARS Coronavirus 2: NEGATIVE

## 2019-04-05 LAB — HIV ANTIBODY (ROUTINE TESTING W REFLEX): HIV Screen 4th Generation wRfx: NONREACTIVE

## 2019-04-05 MED ORDER — DOCUSATE SODIUM 100 MG PO CAPS
100.0000 mg | ORAL_CAPSULE | Freq: Two times a day (BID) | ORAL | Status: DC
  Administered 2019-04-05 – 2019-04-06 (×3): 100 mg via ORAL
  Filled 2019-04-05 (×4): qty 1

## 2019-04-05 MED ORDER — FUROSEMIDE 10 MG/ML IJ SOLN
40.0000 mg | Freq: Two times a day (BID) | INTRAMUSCULAR | Status: DC
  Administered 2019-04-05 – 2019-04-07 (×5): 40 mg via INTRAVENOUS
  Filled 2019-04-05 (×5): qty 4

## 2019-04-05 MED ORDER — HYDRALAZINE HCL 20 MG/ML IJ SOLN
10.0000 mg | Freq: Four times a day (QID) | INTRAMUSCULAR | Status: DC | PRN
  Administered 2019-04-05: 16:00:00 10 mg via INTRAVENOUS
  Filled 2019-04-05: qty 1

## 2019-04-05 MED ORDER — MAGNESIUM OXIDE 400 (241.3 MG) MG PO TABS
400.0000 mg | ORAL_TABLET | Freq: Two times a day (BID) | ORAL | Status: DC
  Administered 2019-04-05 – 2019-04-16 (×19): 400 mg via ORAL
  Filled 2019-04-05 (×19): qty 1

## 2019-04-05 MED ORDER — IPRATROPIUM-ALBUTEROL 0.5-2.5 (3) MG/3ML IN SOLN: 3.0000 mL | Freq: Three times a day (TID) | RESPIRATORY_TRACT | Status: DC

## 2019-04-05 MED ORDER — SIMETHICONE 80 MG PO CHEW
80.0000 mg | CHEWABLE_TABLET | Freq: Four times a day (QID) | ORAL | Status: DC | PRN
  Administered 2019-04-06: 80 mg via ORAL
  Filled 2019-04-05 (×3): qty 1

## 2019-04-05 MED ORDER — CARVEDILOL 3.125 MG PO TABS
6.2500 mg | ORAL_TABLET | Freq: Two times a day (BID) | ORAL | Status: DC
  Administered 2019-04-05: 12:00:00 6.25 mg via ORAL
  Filled 2019-04-05: qty 2

## 2019-04-05 MED ORDER — LORAZEPAM 2 MG/ML IJ SOLN: 1.0000 mg | Freq: Once | INTRAMUSCULAR | Status: DC

## 2019-04-05 MED ORDER — CARVEDILOL 3.125 MG PO TABS: 6.2500 mg | ORAL_TABLET | Freq: Two times a day (BID) | ORAL | Status: DC

## 2019-04-05 MED ORDER — SPIRONOLACTONE 25 MG PO TABS
25.0000 mg | ORAL_TABLET | Freq: Every day | ORAL | Status: DC
  Administered 2019-04-05: 25 mg via ORAL
  Filled 2019-04-05: qty 1

## 2019-04-05 MED ORDER — GUAIFENESIN ER 600 MG PO TB12
600.0000 mg | ORAL_TABLET | Freq: Two times a day (BID) | ORAL | Status: DC
  Administered 2019-04-05 – 2019-04-16 (×18): 600 mg via ORAL
  Filled 2019-04-05 (×23): qty 1

## 2019-04-05 MED ORDER — METHOCARBAMOL 500 MG PO TABS
500.0000 mg | ORAL_TABLET | Freq: Four times a day (QID) | ORAL | Status: DC
  Administered 2019-04-05 – 2019-04-16 (×32): 500 mg via ORAL
  Filled 2019-04-05 (×47): qty 1

## 2019-04-05 MED ORDER — BISACODYL 5 MG PO TBEC
5.0000 mg | DELAYED_RELEASE_TABLET | Freq: Every day | ORAL | Status: DC | PRN
  Administered 2019-04-06: 01:00:00 5 mg via ORAL
  Filled 2019-04-05: qty 1

## 2019-04-05 MED ORDER — POLYETHYLENE GLYCOL 3350 17 G PO PACK
17.0000 g | PACK | Freq: Two times a day (BID) | ORAL | Status: AC
  Administered 2019-04-05 – 2019-04-06 (×2): 17 g via ORAL
  Filled 2019-04-05 (×2): qty 1

## 2019-04-05 MED ORDER — IPRATROPIUM-ALBUTEROL 0.5-2.5 (3) MG/3ML IN SOLN
3.0000 mL | Freq: Four times a day (QID) | RESPIRATORY_TRACT | Status: DC | PRN
  Administered 2019-04-05 – 2019-04-09 (×3): 3 mL via RESPIRATORY_TRACT
  Filled 2019-04-05 (×3): qty 3

## 2019-04-05 MED ORDER — IOHEXOL 9 MG/ML PO SOLN: 500.0000 mL | ORAL | Status: AC

## 2019-04-05 MED ORDER — TRAZODONE HCL 50 MG PO TABS
50.0000 mg | ORAL_TABLET | Freq: Every day | ORAL | Status: DC
  Administered 2019-04-05 – 2019-04-08 (×4): 50 mg via ORAL
  Filled 2019-04-05 (×5): qty 1

## 2019-04-05 MED ORDER — METOPROLOL TARTRATE 50 MG PO TABS
50.0000 mg | ORAL_TABLET | Freq: Two times a day (BID) | ORAL | Status: DC
  Administered 2019-04-05 – 2019-04-06 (×3): 50 mg via ORAL
  Filled 2019-04-05 (×2): qty 1

## 2019-04-05 MED ORDER — SPIRONOLACTONE 25 MG PO TABS
25.0000 mg | ORAL_TABLET | Freq: Every day | ORAL | Status: DC
  Administered 2019-04-06 – 2019-04-09 (×4): 25 mg via ORAL
  Filled 2019-04-05 (×4): qty 1

## 2019-04-05 MED ORDER — OXYCODONE HCL 5 MG PO TABS
5.0000 mg | ORAL_TABLET | Freq: Two times a day (BID) | ORAL | Status: DC
  Administered 2019-04-05 – 2019-04-09 (×9): 5 mg via ORAL
  Filled 2019-04-05 (×10): qty 1

## 2019-04-05 NOTE — TOC Initial Note (Signed)
Transition of Care [facility]) - Initial/Assessment Note    Patient Details  Name: [name] MRN: [id number] Date of Birth: [birth year]  Transition of Care [facility]) CM/SW Contact:    Shelbie Hutching, RN Phone Number: 04/05/2019, 10:36 AM  Clinical Narrative:                 Patient admitted for stroke, history of COPD.  Patient goes to the [facility] for PCP services and medications.  Patient is open with [facility].  Sharmon Revere with [facility] is aware of admission.  Patient has needed DME at home but reports that he would like a scooter and the [place] is working with him on this.   Patient lives with his wife, she provides his transportation.  Patient is independent in ADL's and walks with a walker.  RNCM will cont to follow.   Expected Discharge Plan: [place] Barriers to Discharge: Continued Medical Work up   Patient Goals and CMS Choice   CMS Medicare.gov Compare Post Acute Care list provided to:: Patient Choice offered to / list presented to : Patient  Expected Discharge Plan and Services Expected Discharge Plan: [place]   Discharge Planning Services: CM Consult Post Acute Care Choice: Resumption of Svcs/PTA Provider Living arrangements for the past 2 months: Apartment                             HH Agency: [place] Date [place]: 04/05/19 Time HH Agency Contacted: [phone] Representative spoke with at [place]: Sharmon Revere  Prior Living Arrangements/Services Living arrangements for the past 2 months: Apartment Lives with:: Spouse Patient language and need for interpreter reviewed:: No Do you feel safe going back to the place where you live?: Yes      Need for Family Participation in Patient Care: Yes (Comment)(COPD) Care giver support system in place?: Yes (comment)(wife) Current home services: Home PT, Home RN, DME(walker, cane) Criminal Activity/Legal Involvement Pertinent to Current  Situation/Hospitalization: No - Comment as needed  Activities of Daily Living Home Assistive Devices/Equipment: Cane (specify quad or straight), Walker (specify type) ADL Screening (condition at time of admission) Patient's cognitive ability adequate to safely complete daily activities?: Yes Is the patient deaf or have difficulty hearing?: No Does the patient have difficulty seeing, even when wearing glasses/contacts?: No Does the patient have difficulty concentrating, remembering, or making decisions?: No Patient able to express need for assistance with ADLs?: Yes Does the patient have difficulty dressing or bathing?: No Independently performs ADLs?: Yes (appropriate for developmental age) Does the patient have difficulty walking or climbing stairs?: Yes Weakness of Legs: Left Weakness of Arms/Hands: None  Permission Sought/Granted Permission sought to share information with : Case Manager, Other (comment) Permission granted to share information with : Yes, Verbal Permission Granted     Permission granted to share info w AGENCY: [facility]        Emotional Assessment Appearance:: Appears stated age Attitude/Demeanor/Rapport: Engaged Affect (typically observed): Accepting Orientation: : Oriented to Self, Oriented to Place, Oriented to  Time, Oriented to Situation Alcohol / Substance Use: Not Applicable Psych Involvement: No (comment)  Admission diagnosis:  Weakness [R53.1] Cerebrovascular accident (CVA), unspecified mechanism ([place]) [I63.9] Patient Active Problem List   Diagnosis Date Noted  . Persistent atrial fibrillation ([place])   . CHF (congestive heart failure) ([place]) 01/23/2019  . Acute on chronic systolic CHF (congestive heart failure) ([place]) 01/22/2019  .  HTN (hypertension) 09/02/2018  . DM (diabetes mellitus) (HCC) 09/02/2018  . SOB (shortness of breath) 08/18/2018  . HFrEF (heart failure with reduced ejection fraction) (HCC)   . Lactic acidosis 07/25/2018  .  Hyperbilirubinemia 07/25/2018  . Stroke (HCC) 06/14/2018  . Carotid stenosis 06/13/2018  . Elevated troponin 06/13/2018  . Atrial fibrillation, chronic (HCC) 06/13/2018  . TIA (transient ischemic attack) 06/13/2018  . TIA involving carotid artery 06/13/2018  . Atrial flutter with rapid ventricular response (HCC) 02/18/2017  . CAD in native artery   . Chest pain 01/05/2017  . Ischemic cardiomyopathy 07/26/2016  . Non-sustained ventricular tachycardia (HCC) 07/26/2016  . COPD (chronic obstructive pulmonary disease) (HCC) 07/24/2016  . CAD (coronary artery disease) 07/24/2016  . HLD (hyperlipidemia) 07/24/2016  . Tobacco abuse 07/24/2016  . Hypertensive heart disease 07/24/2016   PCP:  [facility]:   [facility] - [place], [place] - [address] [place] [address] Phone: [phone] Fax: [phone]  [id number] [place], [place] - [address] AT [facility] OF [address] [place] [address] Phone: [phone] Fax: [phone]  [facility] #[id number] [place], [place] - [address] AT [facility] [address] [place] [address] Phone: [phone] Fax: [phone]     Social Determinants of Health (SDOH) Interventions    Readmission Risk Interventions Readmission Risk Prevention Plan 01/24/2019  Transportation Screening Complete  HRI or Home Care Consult Complete  Social Work Consult for Recovery Care Planning/Counseling Complete  Palliative Care Screening Not Applicable  Medication Review Oceanographer) Complete  Some recent data might be hidden

## 2019-04-05 NOTE — Progress Notes (Signed)
Patient went down for the US of the carotid arteries and they got half way through and [place] called me to tell me that he refused to continue on because he said he had mucus in his chest so they are sending him back to his room.

## 2019-04-05 NOTE — Progress Notes (Signed)
Patients blood pressure was 149/120 in right arm and  119/102 in left arm. Gave PRN Hydralazine.

## 2019-04-05 NOTE — Progress Notes (Signed)
Patient c/o having gas and being constipated. Messaged Dr. Darvin Neighbours. New orders.

## 2019-04-05 NOTE — Progress Notes (Signed)
SLP Cancellation Note  Patient Details Name: [name] MRN: [id number] DOB: September 24, 1955   Cancelled treatment:       Reason Eval/Treat Not Completed: Patient not medically ready(chart reviewed). Pt's BP is elevated at this time. ST services will f/u in the morning for Cognitive-linguistic evaluation.     Orinda Kenner, [place], CCC-SLP Watson,Katherine 04/05/2019, 5:09 PM

## 2019-04-05 NOTE — Evaluation (Signed)
Occupational Therapy Evaluation Patient Details Name: [name] MRN: [id number] DOB: 07/01/1955 Today's Date: 04/05/2019    History of Present Illness 63yo male presenting with R side weakness and dysarthria. PMHx includes Afib, DM, CAD, CHF, COPD, and CVA.   Clinical Impression   Pt seen for OT evaluation this date. Prior to hospital admission, pt was independent in all aspects of ADL and using a scoot for most mobility per pt.  Pt lives with his spouse in a handicap accessible apartment. Currently pt demonstrates impairments in R sided strength and coordination and mild impaired balance. Pt is R hand dominant and CGA to Min A for LB ADL and CGA for functional mobility due to impairments. Pt is eager to return to his PLOF with increased independence and functional use of his right arm and leg. Pt would benefit from skilled OT to address noted impairments and functional limitations (see below for any additional details) in order to maximize safety and independence while minimizing falls risk and caregiver burden.  Upon hospital discharge, recommend pt discharge with HHOT to maximize safety and return to PLOF.    Follow Up Recommendations  Home health OT    Equipment Recommendations  None recommended by OT    Recommendations for Other Services       Precautions / Restrictions Precautions Precautions: Fall Restrictions Weight Bearing Restrictions: No      Mobility Bed Mobility               General bed mobility comments: deferred, received in recliner  Transfers Overall transfer level: Needs assistance Equipment used: None Transfers: Sit to/from Stand Sit to Stand: Min guard              Balance Overall balance assessment: Mild deficits observed, not formally tested                                         ADL either performed or assessed with clinical judgement   ADL Overall ADL's : Needs assistance/impaired                                        General ADL Comments: CGA to Min A for LB ADL     Vision Patient Visual Report: No change from baseline Vision Assessment?: No apparent visual deficits     Perception     Praxis      Pertinent Vitals/Pain Pain Assessment: No/denies pain     Hand Dominance Right   Extremity/Trunk Assessment Upper Extremity Assessment Upper Extremity Assessment: RUE deficits/detail;LUE deficits/detail RUE Deficits / Details: grossly 4-/5, mild FMC deficits, denies sensory deficits RUE Sensation: WNL RUE Coordination: decreased fine motor LUE Deficits / Details: grossly 4/5 LUE Sensation: WNL LUE Coordination: WNL   Lower Extremity Assessment Lower Extremity Assessment: Defer to PT evaluation;RLE deficits/detail;LLE deficits/detail RLE Deficits / Details: grossly 4-/5, denies sensory deficits LLE Deficits / Details: grossly 4/5   Cervical / Trunk Assessment Cervical / Trunk Assessment: Normal   Communication Communication Communication: No difficulties   Cognition Arousal/Alertness: Lethargic Behavior During Therapy: WFL for tasks assessed/performed Overall Cognitive Status: Within Functional Limits for tasks assessed  General Comments: cues to remain alert and attend to session, very sleepy   General Comments       Exercises     Shoulder Instructions      Home Living Family/patient expects to be discharged to:: Private residence Living Arrangements: Spouse/significant other Available Help at Discharge: Family;Available 24 hours/day Type of Home: Apartment(handicap accessible) Home Access: Level entry     Home Layout: One level     Bathroom Shower/Tub: Occupational psychologist: Handicapped height     Home Equipment: Environmental consultant - 2 wheels;Cane - single point;Electric scooter   Additional Comments: primarily uses scooter      Prior Functioning/Environment Level of Independence: Independent  with assistive device(s)        Comments: Per pt, uses scooter for most mobility, indep with ADL, limited functional mobility        OT Problem List: Decreased strength;Decreased coordination;Decreased activity tolerance;Impaired balance (sitting and/or standing);Decreased knowledge of use of DME or AE;Impaired UE functional use      OT Treatment/Interventions: Self-care/ADL training;Therapeutic exercise;Therapeutic activities;Neuromuscular education;DME and/or AE instruction;Patient/family education;Balance training    OT Goals(Current goals can be found in the care plan section) Acute Rehab OT Goals Patient Stated Goal: go home OT Goal Formulation: With patient Time For Goal Achievement: 04/29/19 Potential to Achieve Goals: Good ADL Goals Pt Will Perform Lower Body Dressing: with supervision;sit to/from stand Pt Will Transfer to Toilet: with supervision;ambulating;regular height toilet Additional ADL Goal #1: Pt will perform there-ex for RUE strength and coordination with modified independence using handout.  OT Frequency: Min 1X/week   Barriers to D/C:            Co-evaluation              AM-PAC OT "6 Clicks" Daily Activity     Outcome Measure Help from another person eating meals?: None Help from another person taking care of personal grooming?: None Help from another person toileting, which includes using toliet, bedpan, or urinal?: A Little Help from another person bathing (including washing, rinsing, drying)?: A Little Help from another person to put on and taking off regular upper body clothing?: None Help from another person to put on and taking off regular lower body clothing?: A Little 6 Click Score: 21   End of Session    Activity Tolerance: Patient tolerated treatment well Patient left: in chair;with call bell/phone within reach;with chair alarm set  OT Visit Diagnosis: Other abnormalities of gait and mobility (R26.89);Hemiplegia and  hemiparesis Hemiplegia - Right/Left: Right Hemiplegia - dominant/non-dominant: Dominant Hemiplegia - caused by: Unspecified                Time: 1610-9604 OT Time Calculation (min): 10 min Charges:  OT General Charges $OT Visit: 1 Visit OT Evaluation $OT Eval Low Complexity: 1 Low  Jeni Salles, MPH, MS, OTR/L ascom [phone] 04/05/19, 10:50 AM

## 2019-04-05 NOTE — Progress Notes (Addendum)
Patients blood pressure at 1117 was 162/103 and now at 1144 it was 164/108 HR 80s. Messaged Dr. Serita Grit about same.  New orders placed.

## 2019-04-05 NOTE — Progress Notes (Signed)
[employer] [facility] - [place] [facility]                                                                                                                                                                                  Patient Demographics   [name], is a 63 y.o. male, DOB - [birth year], EPP:[id number]  Admit date - 04/04/2019   Admitting Physician Oralia Manisavid Willis, MD  Outpatient [facility]   LOS - 1  Subjective: Patient states that his right-sided weakness is resolved.  He is complaining of some congestion and cough.    Review of Systems:   CONSTITUTIONAL: No documented fever. No fatigue, weakness. No weight gain, no weight loss.  EYES: No blurry or double vision.  ENT: No tinnitus. No postnasal drip. No redness of the oropharynx.  RESPIRATORY: Positive cough, no wheeze, no hemoptysis.  Positive dyspnea.  CARDIOVASCULAR: No chest pain. No orthopnea. No palpitations. No syncope.  GASTROINTESTINAL: No nausea, no vomiting or diarrhea. No abdominal pain. No melena or hematochezia.  GENITOURINARY: No dysuria or hematuria.  ENDOCRINE: No polyuria or nocturia. No heat or cold intolerance.  HEMATOLOGY: No anemia. No bruising. No bleeding.  INTEGUMENTARY: No rashes. No lesions.  MUSCULOSKELETAL: No arthritis. No swelling. No gout.  NEUROLOGIC: No numbness, tingling, or ataxia. No seizure-type activity.  PSYCHIATRIC: No anxiety. No insomnia. No ADD.    Vitals:   Vitals:   04/05/19 0829 04/05/19 0950 04/05/19 1117 04/05/19 1144  BP: 131/87 (!) 148/84 (!) 162/103 (!) 164/108  Pulse: (!) 106 88  84  Resp: 18 18  20   Temp: 98.4 F (36.9 C) 98.2 F (36.8 C)  98.4 F (36.9 C)  TempSrc: Oral Oral    SpO2: 100% 98%  100%  Weight:      Height:        Wt Readings from Last 3 Encounters:  04/05/19 99.8 kg  04/04/19 99.8 kg  03/14/19 97.5 kg     Intake/Output Summary (Last 24 hours) at 04/05/2019 1318 Last data filed at  04/05/2019 0900 Gross per 24 hour  Intake 240 ml  Output -  Net 240 ml    Physical Exam:   GENERAL: Pleasant-appearing in no apparent distress.  HEAD, EYES, EARS, NOSE AND THROAT: Atraumatic, normocephalic. Extraocular muscles are intact. Pupils equal and reactive to light. Sclerae anicteric. No conjunctival injection. No oro-pharyngeal erythema.  NECK: Supple. There is no jugular venous distention. No bruits, no lymphadenopathy, no thyromegaly.  HEART: Regular rate and rhythm,. No murmurs, no rubs, no clicks.  LUNGS: OCCASIONAL CRACKLES no wheezing no rhonchus ABDOMEN: Soft, flat, nontender, nondistended. Has good bowel sounds. No  hepatosplenomegaly appreciated.  EXTREMITIES: No evidence of any cyanosis, clubbing, or peripheral edema.  +2 pedal and radial pulses bilaterally.  NEUROLOGIC: The patient is alert, awake, and oriented x3 with no focal motor or sensory deficits appreciated bilaterally.  SKIN: Moist and warm with no rashes appreciated.  Psych: Not anxious, depressed LN: No inguinal LN enlargement    Antibiotics   Anti-infectives (From admission, onward)   None      Medications   Scheduled Meds: . atorvastatin  80 mg Oral Daily  . docusate sodium  100 mg Oral BID  . furosemide  40 mg Intravenous Q12H  . guaiFENesin  600 mg Oral BID  . LORazepam  1 mg Intravenous Once  . magnesium oxide  400 mg Oral BID  . methocarbamol  500 mg Oral QID  . mometasone-formoterol  2 puff Inhalation BID  . oxyCODONE  5 mg Oral BID  . rivaroxaban  20 mg Oral Q supper  . spironolactone  25 mg Oral Daily  . traZODone  50 mg Oral QHS   Continuous Infusions: PRN Meds:.hydrALAZINE, ipratropium-albuterol   Data Review:   Micro Results Recent Results (from the past 240 hour(s))  SARS CORONAVIRUS 2 (TAT 6-24 HRS) Nasopharyngeal Nasopharyngeal Swab     Status: None   Collection Time: 04/04/19  9:21 PM   Specimen: Nasopharyngeal Swab  Result Value Ref Range Status   SARS Coronavirus  2 NEGATIVE NEGATIVE Final    Comment: (NOTE) SARS-CoV-2 target nucleic acids are NOT DETECTED. The SARS-CoV-2 RNA is generally detectable in upper and lower respiratory specimens during the acute phase of infection. Negative results do not preclude SARS-CoV-2 infection, do not rule out co-infections with other pathogens, and should not be used as the sole basis for treatment or other patient management decisions. Negative results must be combined with clinical observations, patient history, and epidemiological information. The expected result is Negative. Fact Sheet for Patients: SugarRoll.be Fact Sheet for Healthcare Providers: https://www.woods-mathews.com/ This test is not yet approved or cleared by the [place] FDA and  has been authorized for detection and/or diagnosis of SARS-CoV-2 by FDA under an Emergency Use Authorization (EUA). This EUA will remain  in effect (meaning this test can be used) for the duration of the COVID-19 declaration under Section 56 4(b)(1) of the Act, 21 U.S.C. section 360bbb-3(b)(1), unless the authorization is terminated or revoked sooner. Performed at [facility], [place] [address], [place], [place] [address]     Radiology Reports Ct Head Wo Contrast  Result Date: 04/04/2019 CLINICAL DATA:  Weakness and confusion EXAM: CT HEAD WITHOUT CONTRAST TECHNIQUE: Contiguous axial images were obtained from the base of the skull through the vertex without intravenous contrast. COMPARISON:  CT 03/04/2019, MRI 06/14/2018 FINDINGS: Brain: Focal hypodensity within the left frontal white matter, new since 03/04/2019 comparison head CT. No hemorrhage. No mass. Mild atrophy and small vessel ischemic changes of the white matter. Stable ventricle size Vascular: No hyperdense vessels.  Carotid vascular calcification Skull: Normal. Negative for fracture or focal lesion. Sinuses/Orbits: Old fractures of the medial walls of both  orbits. Other: None IMPRESSION: 1. Focal hypodensity within the left frontal white matter, suspicious for acute to subacute infarct. Negative for hemorrhage. 2. Mild atrophy and small vessel ischemic changes of the white matter. Electronically Signed   By: Donavan Foil M.D.   On: 04/04/2019 19:27   Dg Chest Port 1 View  Result Date: 04/04/2019 CLINICAL DATA:  Weakness and confusion. EXAM: PORTABLE CHEST 1 VIEW COMPARISON:  Radiographs 04/04/2019 and 03/04/2019. FINDINGS: 1853 hours. There is stable cardiomegaly and aortic atherosclerosis. Chronic interstitial prominence is similar to the study done earlier today and before. There is no superimposed airspace disease, pleural effusion or pneumothorax. The bones appear unchanged. IMPRESSION: Stable examination with cardiomegaly, vascular congestion and chronic interstitial prominence. No new findings. Electronically Signed   By: Carey Bullocks M.D.   On: 04/04/2019 19:23   Dg Chest Portable 1 View  Result Date: 04/04/2019 CLINICAL DATA:  Patient with cough and shortness of breath EXAM: PORTABLE CHEST 1 VIEW COMPARISON:  Chest radiograph 03/04/2019 FINDINGS: Monitoring leads overlie the patient. Stable cardiomegaly. Aortic atherosclerosis. No large area of pulmonary consolidation. Similar mild interstitial opacities. No pleural effusion or pneumothorax. Thoracic spine degenerative changes. IMPRESSION: Cardiomegaly. Similar mild interstitial opacities may represent mild edema Electronically Signed   By: Annia Belt M.D.   On: 04/04/2019 08:10     CBC Recent Labs  Lab 04/04/19 0748 04/04/19 1937  WBC 6.9 8.1  HGB 12.2* 12.6*  HCT 38.5* 39.5  PLT 105* 120*  MCV 90.8 90.4  MCH 28.8 28.8  MCHC 31.7 31.9  RDW 18.5* 18.6*  LYMPHSABS 1.0 1.6  MONOABS 0.3 0.5  EOSABS 0.0 0.1  BASOSABS 0.1 0.1    Chemistries  Recent Labs  Lab 04/04/19 0748 04/04/19 2041  NA 137 135  K 4.1 3.6  CL 104 99  CO2 24 24  GLUCOSE 190* 116*  BUN 21 26*   CREATININE 1.29* 1.45*  CALCIUM 9.2 9.8  AST  --  30  ALT  --  35  ALKPHOS  --  209*  BILITOT  --  1.8*   ------------------------------------------------------------------------------------------------------------------ estimated creatinine clearance is 63.6 mL/min (A) (by C-G formula based on SCr of 1.45 mg/dL (H)). ------------------------------------------------------------------------------------------------------------------ Recent Labs    04/05/19 0521  HGBA1C 8.6*   ------------------------------------------------------------------------------------------------------------------ Recent Labs    04/05/19 0521  CHOL 167  HDL 56  LDLCALC 86  TRIG 126  CHOLHDL 3.0   ------------------------------------------------------------------------------------------------------------------ No results for input(s): TSH, T4TOTAL, T3FREE, THYROIDAB in the last 72 hours.  Invalid input(s): FREET3 ------------------------------------------------------------------------------------------------------------------ No results for input(s): VITAMINB12, FOLATE, FERRITIN, TIBC, IRON, RETICCTPCT in the last 72 hours.  Coagulation profile No results for input(s): INR, PROTIME in the last 168 hours.  No results for input(s): DDIMER in the last 72 hours.  Cardiac Enzymes No results for input(s): CKMB, TROPONINI, MYOGLOBIN in the last 168 hours.  Invalid input(s): CK ------------------------------------------------------------------------------------------------------------------ Invalid input(s): POCBNP    Assessment & Plan  Patient 63 year old presenting with acute stroke    1. Acute stroke (HCC) -MRI/MRA of the brain pending.  Carotid Dopplers are pending as well according to his history states that patient has a history of coronary artery stenosis and due to multiple medical problems he has not had this operated on. Continue Xarelto Started aspirin   2.  Persistent atrial  fibrillation (HCC) -continue home meds including anticoagulation  3.   COPD (chronic obstructive pulmonary disease) (HCC) -continue home medications  4.  CAD in native artery -continue home meds  5.   HFrEF (heart failure with reduced ejection raction) (HCC) -patient's breathing is improved from this morning.  Continue home meds  6.   HTN (hypertension) -hold antihypertensives for now as the patient will be on permissive hypertension tonight, blood pressure goal less than 220/120  7.   DM (diabetes mellitus) (HCC) -sliding scale insulin    8. HLD (hyperlipidemia) -home dose antilipid      Code Status Orders  (  From admission, onward)         Start     Ordered   04/04/19 2324  Full code  Continuous     04/04/19 2323        Code Status History    Date Active Date Inactive Code Status Order ID Comments User Context   01/22/2019 0348 01/26/2019 1621 Full Code [id number]  Arnaldo Natal, MD Inpatient   08/18/2018 1028 08/20/2018 1510 Full Code [id number]  Adrian Saran, MD ED   07/26/2018 1207 07/31/2018 1751 Partial Code [id number]  Erin Fulling, MD Inpatient   07/25/2018 2020 07/26/2018 1207 Full Code [id number]  Gery Pray, MD Inpatient   06/13/2018 2049 06/15/2018 1706 Full Code [id number]  Gery Pray, MD Inpatient   01/19/2018 2358 01/21/2018 1343 Full Code [id number]  Cammy Copa, MD ED   03/26/2017 0636 03/27/2017 1729 Full Code [id number]  Arnaldo Natal, MD Inpatient   02/18/2017 2112 02/19/2017 1707 Full Code [id number]  Houston Siren, MD Inpatient   01/05/2017 2246 01/07/2017 1614 Full Code [id number]  Altamese Dilling, MD Inpatient   07/24/2016 0235 07/31/2016 1401 Full Code [id number]  Oralia Manis, MD Inpatient   Advance Care Planning Activity    Advance Directive Documentation     Most Recent Value  Type of Advance Directive  Living will  Pre-existing out of facility DNR order (yellow form or pink MOST form)  -  "MOST" Form in Place?  -           Consults  neurology   DVT Prophylaxis Xarelto  Lab Results  Component Value Date   PLT 120 (L) 04/04/2019     Time Spent in minutes   Greater than 50% of time spent in care coordination and counseling patient regarding the condition and plan of care.   Auburn Bilberry M.D on 04/05/2019 at 1:18 PM  Between 7am to 6pm - Pager - [phone]  After 6pm go to [email or web address] - Social research officer, government  [employer]   Office  [phone]

## 2019-04-05 NOTE — Progress Notes (Signed)
*  PRELIMINARY RESULTS* Echocardiogram 2D Echocardiogram has been performed.  [name] [name] 04/05/2019, 11:04 AM

## 2019-04-05 NOTE — Progress Notes (Signed)
Attempted to contact [name] yesterday but spoke with his wife due to he was cooking.  Set up appt at his home this week on Wednesday. Noticed today he has been admitted, will watch to see when discharged and will visit at home then.    [place] [phone]

## 2019-04-05 NOTE — Evaluation (Signed)
Physical Therapy Evaluation Patient Details Name: [name] MRN: [id number] DOB: [birth year] Today's Date: 04/05/2019   History of Present Illness  63 yo male presented to ED 10/5 with dysarthria and mild R sided weakness, [facility] scan shows left frontal infarct. PMH includes CAD, CKD, COPD, DM, HLD, HTN, persistent Afib, stroke 05/2018  Clinical Impression  Pt is a 63 yo male admitted for above. Pt up ambulating to restroom upon arrival and agreed to participate with PT. Pt reports having been getting up in his room independently. BP assessed in sitting and 162/103 with nursing notified. Pt performed functional mobility with min guard assistance. Pt presents with decreased strength (R sided > L sided), balance and activity tolerance. Pt reports being independent with ADLs/IADLs but is mostly using a scooter for mobility. Pt reports having a recent fall a couple months ago where he tripped over the trashcan and landed on his knee. Pt alert and oriented to person, place, month and situation stating he had a stroke. Pt denies any sensation changes or visual changes during session. Pt educated on seated therex to performance as HEP. Pt would benefit from skilled acute therapy to improve deficits noted and home health PT in order to further improve deficits, decrease fall risk and improve independence.     Follow Up Recommendations Home health PT    Equipment Recommendations  None recommended by PT    Recommendations for Other Services       Precautions / Restrictions Precautions Precautions: Fall Restrictions Weight Bearing Restrictions: No      Mobility  Bed Mobility Overal bed mobility: Independent             General bed mobility comments: pt up walking to restroom upon arrival  Transfers Overall transfer level: Needs assistance Equipment used: None Transfers: Sit to/from Stand Sit to Stand: Min guard         General transfer  comment: pt steady with initial standing, pt tries to reach out for furniture to help pull himself to standing, pt requires vc for hand placement, min guard for safety  Ambulation/Gait Ambulation/Gait assistance: Min guard Gait Distance (Feet): 50 Feet Assistive device: None Gait Pattern/deviations: Step-through pattern;Decreased stride length;Antalgic Gait velocity: decreased   General Gait Details: pt ambulated with step through gait pattern however very antalgic secondary to complaints of Left knee pain, pt also ambulates with decreased knee flexion on left, pt reports he typically uses his scooter for ambulation, pt had no overt LOB, unsteadiness or buckling, pt freq trying to reach out for hand rails during ambulation  Stairs            Wheelchair Mobility    Modified Rankin (Stroke Patients Only)       Balance Overall balance assessment: Needs assistance;History of Falls Sitting-balance support: Feet supported Sitting balance-Leahy Scale: Good Sitting balance - Comments: steady sitting EOB     Standing balance-Leahy Scale: Good Standing balance comment: pt able to maintain balance without AD however mild balance deficits noted               High Level Balance Comments: pt able to maintain narrow BOS without UE support for 30 sec, pt able to maintain semi tandem for ~15 sec with each foot in front with increased difficulty and sway with pt losing balance and having to use a step response and reach for rail             Pertinent  Vitals/Pain Pain Assessment: 0-10 Pain Score: 4  Pain Location: left knee Pain Descriptors / Indicators: Aching;Sore Pain Intervention(s): Limited activity within patient's tolerance;Monitored during session    Home Living Family/patient expects to be discharged to:: Private residence Living Arrangements: Spouse/significant other Available Help at Discharge: Family;Available 24 hours/day Type of Home: Apartment(handicap  accessible) Home Access: Level entry     Home Layout: One level Home Equipment: Walker - 2 wheels;Cane - single point;Electric scooter Additional Comments: pt reports primarily using scooter    Prior Function Level of Independence: Independent with assistive device(s)         Comments: Per pt, uses scooter for most mobility, indep with ADL, limited functional mobility     Hand Dominance   Dominant Hand: Right    Extremity/Trunk Assessment   Upper Extremity Assessment Upper Extremity Assessment: Defer to OT evaluation(RUE weaker than LUE)     Lower Extremity Assessment Lower Extremity Assessment: (dec strength RLE > LLE, denies any sensory changes, RLE grossly 4-/5)      Cervical / Trunk Assessment Cervical / Trunk Assessment: Normal  Communication   Communication: No difficulties  Cognition Arousal/Alertness: Awake/alert Behavior During Therapy: WFL for tasks assessed/performed Overall Cognitive Status: Within Functional Limits for tasks assessed                                       General Comments General comments (skin integrity, edema, etc.): pt denies any blurry vision today    Exercises Total Joint Exercises Long Arc Quad: AROM;Both;10 reps Marching in Standing: AROM;Both;10 reps;Seated General Exercises - Lower Extremity Toe Raises: AROM;Both;10 reps;Seated Heel Raises: AROM;Right;10 reps;Seated   Assessment/Plan    PT Assessment Patient needs continued PT services  PT Problem List Decreased strength;Decreased mobility;Decreased safety awareness;Decreased range of motion;Decreased coordination;Decreased activity tolerance;Decreased balance;Decreased knowledge of use of DME       PT Treatment Interventions DME instruction;Therapeutic exercise;Gait training;Balance training;Stair training;Neuromuscular re-education;Functional mobility training;Therapeutic activities;Patient/family education    PT Goals (Current goals can be found  in the Care Plan section)  Acute Rehab PT Goals Patient Stated Goal: return home PT Goal Formulation: With patient Time For Goal Achievement: 05-15-2019 Potential to Achieve Goals: Good    Frequency 7X/week   Barriers to discharge        Co-evaluation               AM-PAC PT "6 Clicks" Mobility  Outcome Measure Help needed turning from your back to your side while in a flat bed without using bedrails?: None Help needed moving from lying on your back to sitting on the side of a flat bed without using bedrails?: None Help needed moving to and from a bed to a chair (including a wheelchair)?: None Help needed standing up from a chair using your arms (e.g., wheelchair or bedside chair)?: A Little Help needed to walk in hospital room?: A Little Help needed climbing 3-5 steps with a railing? : A Little 6 Click Score: 21    End of Session Equipment Utilized During Treatment: Gait belt Activity Tolerance: Patient tolerated treatment well Patient left: in chair;with call bell/phone within reach;with nursing/sitter in room Nurse Communication: Mobility status PT Visit Diagnosis: Difficulty in walking, not elsewhere classified (R26.2);Muscle weakness (generalized) (M62.81);Other symptoms and signs involving the nervous system (R29.898)    Time: 6237-6283 PT Time Calculation (min) (ACUTE ONLY): 28 min   Charges:   PT Evaluation $PT  Eval Low Complexity: 1 Low         Latoshia Monrroy PT, DPT 11:35 AM,04/05/19 [phone]

## 2019-04-05 NOTE — Progress Notes (Signed)
Pt continues to refuse his MRI. MD aware.

## 2019-04-05 NOTE — Consult Note (Signed)
Chief Complaint:  Left frontal stroke HPI: [name] is an 63 y.o. male with history of CAD, CHF, HTN, HLD, atrial fibrillation on Xarelto, CVA admitted with right sided weakness and slurred speech.  Patient is a limited historian.  Per  Note, patient seen in this ED earlier on day of admission  for SOB, found to have acute on chronic exacerbation of his heart failure. Received IV Lasix and discharged with plan to follow up in heart failure clinic. After leaving the emergency department he developed some slurred speech and right-sided weakness.  His home health nurse checked on him and was concerned he may be experiencing a stroke, which prompted him to return to the emergency department. Patient states his symptoms started around 1 PM today.  He is on anticoagulation for his history of atrial fibrillation.  He reports a history of prior strokes with similar symptoms.  He denies any recent illnesses, no vomiting, diarrhea, urinary symptoms Head ct obtained in ER showing: Focal hypodensity within the left frontal white matter,suspicious for acute to subacute infarct. Negative for hemorrhage.Mild atrophy and small vessel ischemic changes of the white matter. Reviewing his chart, patient had an ct angio of neck and head on 05/2018 that showed Severe calcified and noncalcified plaque at the LEFT carotid bifurcation, extending into the proximal ICA. Estimated 80-90% stenosis.Non stenotic atheromatous change RIGHT carotid bifurcation, estimated 60% stenosis.no intracranial flow-limiting stenosis or large vessel occlusion.Significant calcified plaque both distal cavernous carotid arteries, estimated 50-75% stenosis bilaterally.Poor dentition, with numerous missing teeth, dental caries, andperiapical lucencies. Echo on 05/2018 with EF 30-35% with diffuse hypokinesis. Pateitn admitted for further management This AM, patient working with PT, standing and has walked around. States that he is sob  Past  Medical History:  Diagnosis Date  . CAD in native artery    a. stress echo 12/2007 abnl, EF > 55%, b. LHC 01/28/08: mLAD 30, D1 40, dLCx 70, pRCA 30, mRCA 70, mRCA lesion 2 80, PDA 90, s/p PCI/BMS to prox and distal RCA, s/p PCI/BMS to PDA; c. patient reports PCI/stenting x 2 in early 2017 at the [place] (no records on file) d. 06/2016: cath showing patent stents along RCA and LCx with moderate 40% stenosis along the LAD.   [name] Carotid arterial disease (HCC)    a. 05/2018 CTA Head/neck: 80-90% stenosis @ L carotid bifurcation, extending into the prox LICA. 60% stenosis @ R carotid bifurcation. 50-75% bilateral distal cavernous carotid dzs.  . Chronic combined systolic (congestive) and diastolic (congestive) heart failure (HCC)    a. 2009 Echo: > 55%; b. 06/2016: EF 25-30%; c. 12/2017 Echo: EF 30-35%, diff HK; d. 05/2018 Echo: 30-35%, mild conc LVH, diff HK. Mild MR. Mildly dil LA/RA.  [name] Chronic kidney disease   . COPD (chronic obstructive pulmonary disease) (HCC)   . Diabetes mellitus without complication (HCC)   . Gout   . Hyperlipidemia   . Hypertension   . Hypertensive heart disease   . Ischemic cardiomyopathy    a. 05/2018: 30-35%.  . Obesity   . Persistent atrial fibrillation (HCC) 01/2017   a. cardioverted 9/18 to NSR; b. 12/2017 noted to be back in Afib-outpt dccv rec; c. CHA2DS2VASc = 6-->Xarelto.  . Stroke (cerebrum) (HCC)    a. 05/2018 MRI: small patchy acute to subacute cortial and white matter infarct in the bilat cerebrum. Mild chronic small vessel ischmia; b. 05/2018 Carotid U/S: L-carotid bifurcation 80-90% stenosed, R- carotid bifurcation 60% stenosed.  . Tobacco abuse  Past Surgical History:  Procedure Laterality Date  . CARDIAC CATHETERIZATION  2009   [facility];   . CARDIAC CATHETERIZATION  2010  . CARDIAC CATHETERIZATION N/A 07/28/2016   Procedure: Right and Left Heart Cath and possible PCI;  Surgeon: Wellington Hampshire, MD;  Location: [facility];  Service: Cardiovascular;   Laterality: N/A;  . CARDIOVERSION N/A 03/27/2017   Procedure: CARDIOVERSION;  Surgeon: Wellington Hampshire, MD;  Location: [facility];  Service: Cardiovascular;  Laterality: N/A;  . CARDIOVERSION N/A 07/30/2018   Procedure: CARDIOVERSION;  Surgeon: Minna Merritts, MD;  Location: [facility];  Service: Cardiovascular;  Laterality: N/A;  . CORONARY ANGIOPLASTY  2009   s/p stent placement at [facility].    Family History  Problem Relation Age of Onset  . Heart attack Mother 76  . Hypertension Mother   . Cancer Brother    Social History:  reports that he has quit smoking. His smoking use included cigarettes. He has a 35.00 pack-year smoking history. He has never used smokeless tobacco. He reports previous alcohol use. He reports current drug use.  Allergies:  Allergies  Allergen Reactions  . Aspirin Other (See Comments)    Told not to take because of blood thinner  . Entresto [Sacubitril-Valsartan] Other (See Comments)    Dizziness, weakness and stuttering  . Ibuprofen Nausea And Vomiting  . Tylenol [Acetaminophen] Nausea And Vomiting    Medications Prior to Admission  Medication Sig Dispense Refill  . albuterol-ipratropium (COMBIVENT) 18-103 MCG/ACT inhaler Inhale 2 puffs into the lungs every 4 (four) hours as needed for wheezing.    [name] atorvastatin (LIPITOR) 80 MG tablet Take 80 mg by mouth daily.    . budesonide-formoterol (SYMBICORT) 160-4.5 MCG/ACT inhaler Inhale 2 puffs into the lungs 2 (two) times daily.    . carvedilol (COREG) 6.25 MG tablet Take 1 tablet (6.25 mg total) by mouth 2 (two) times daily with a meal. 60 tablet 1  . docusate sodium (COLACE) 100 MG capsule Take 1 capsule (100 mg total) by mouth 2 (two) times daily. 60 capsule 6  . magnesium oxide (MAG-OX) 400 MG tablet Take 400 mg by mouth 2 (two) times daily.    . methocarbamol (ROBAXIN) 500 MG tablet Take 500 mg by mouth 4 (four) times daily.    . nitroGLYCERIN (NITROSTAT) 0.4 MG SL tablet Place 0.4 mg under the tongue every 5  (five) minutes as needed for chest pain.    [name] oxyCODONE (OXY IR/ROXICODONE) 5 MG immediate release tablet Take 5 mg by mouth 2 (two) times a day.    . potassium chloride SA (K-DUR,KLOR-CON) 20 MEQ tablet Take 20 mEq by mouth daily.    . rivaroxaban (XARELTO) 15 MG TABS tablet Take 1 tablet (15 mg total) by mouth daily with supper. (Patient taking differently: Take 20 mg by mouth daily with supper. ) 30 tablet 1  . spironolactone (ALDACTONE) 25 MG tablet Take 1 tablet (25 mg total) by mouth daily. 90 tablet 3  . torsemide (DEMADEX) 20 MG tablet Take 2 tablets (40 mg total) by mouth 2 (two) times daily. 120 tablet 0  . traZODone (DESYREL) 50 MG tablet Take 50 mg by mouth at bedtime.      ROS: As pr HPI  Physical Examination: Blood pressure 131/87, pulse (!) 106, temperature 98.4 F (36.9 C), temperature source Oral, resp. rate 18, height 5\' 11"  (1.803 m), weight 99.8 kg, SpO2 100 %.  HEENT-  Normocephalic, no lesions, without obvious abnormality.  Normal external eye  and conjunctiva.  Normal TM's bilaterally.  Normal auditory canals and external ears. Normal external nose, mucus membranes and septum.  Normal pharynx. Neck supple with no masses, nodes, nodules or enlargement. Cardiovascular - regular rate and rhythm, S1, S2 normal, no murmur, click, rub or gallop Lungs - chest clear, no wheezing, rales, normal symmetric air entry, Heart exam - S1, S2 normal, no murmur, no gallop, rate regular Abdomen - soft, non-tender; bowel sounds normal; no masses,  no organomegaly Extremities - 1+ edema in LEX  Neurologic Examination: Alert, awake, following commands, slight dysrathria PERLA, EOMI, no nystagmus, VFF, face seems symmetrical, face sensation tot touch seems symmetrical, uvukla and tongue midline He is 4/5 in all extremities No sensory deficit appreciated No coordination deficit appreciated DTR not checked at time of examination Gait: Patient walked around with PT   Results for orders  placed or performed during the hospital encounter of 04/04/19 (from the past 48 hour(s))  CBC with Differential     Status: Abnormal   Collection Time: 04/04/19  7:37 PM  Result Value Ref Range   WBC 8.1 4.0 - 10.5 K/uL   RBC 4.37 4.22 - 5.81 MIL/uL   Hemoglobin 12.6 (L) 13.0 - 17.0 g/dL   HCT 14.7 82.9 - 56.2 %   MCV 90.4 80.0 - 100.0 fL   MCH 28.8 26.0 - 34.0 pg   MCHC 31.9 30.0 - 36.0 g/dL   RDW 13.0 (H) 86.5 - 78.4 %   Platelets 120 (L) 150 - 400 K/uL   nRBC 0.7 (H) 0.0 - 0.2 %   Neutrophils Relative % 72 %   Neutro Abs 5.8 1.7 - 7.7 K/uL   Lymphocytes Relative 20 %   Lymphs Abs 1.6 0.7 - 4.0 K/uL   Monocytes Relative 6 %   Monocytes Absolute 0.5 0.1 - 1.0 K/uL   Eosinophils Relative 1 %   Eosinophils Absolute 0.1 0.0 - 0.5 K/uL   Basophils Relative 1 %   Basophils Absolute 0.1 0.0 - 0.1 K/uL   Immature Granulocytes 0 %   Abs Immature Granulocytes 0.01 0.00 - 0.07 K/uL    Comment: Performed at [facility], [address] [place], [place] [address]  Urinalysis, Complete w Microscopic     Status: Abnormal   Collection Time: 04/04/19  7:37 PM  Result Value Ref Range   Color, Urine YELLOW (A) YELLOW   APPearance CLEAR (A) CLEAR   Specific Gravity, Urine 1.010 1.005 - 1.030   pH 5.0 5.0 - 8.0   Glucose, UA NEGATIVE NEGATIVE mg/dL   Hgb urine dipstick SMALL (A) NEGATIVE   Bilirubin Urine NEGATIVE NEGATIVE   Ketones, ur NEGATIVE NEGATIVE mg/dL   Protein, ur 30 (A) NEGATIVE mg/dL   Nitrite NEGATIVE NEGATIVE   Leukocytes,Ua NEGATIVE NEGATIVE   RBC / HPF 0-5 0 - 5 RBC/hpf   WBC, UA 0-5 0 - 5 WBC/hpf   Bacteria, UA NONE SEEN NONE SEEN   Squamous Epithelial / LPF 0-5 0 - 5   Mucus PRESENT    Hyaline Casts, UA PRESENT     Comment: Performed at [facility], [address] [place], [place] [address]  Urine Drug Screen, Qualitative     Status: None   Collection Time: 04/04/19  7:37 PM  Result Value Ref Range   Tricyclic, Ur Screen NONE DETECTED NONE  DETECTED   Amphetamines, Ur Screen NONE DETECTED NONE DETECTED   MDMA (Ecstasy)Ur Screen NONE DETECTED NONE DETECTED   Cocaine Metabolite,Ur [place] NONE DETECTED  NONE DETECTED   Opiate, Ur Screen NONE DETECTED NONE DETECTED   Phencyclidine (PCP) Ur S NONE DETECTED NONE DETECTED   Cannabinoid 50 Ng, Ur [place] NONE DETECTED NONE DETECTED   Barbiturates, Ur Screen NONE DETECTED NONE DETECTED   Benzodiazepine, Ur Scrn NONE DETECTED NONE DETECTED   Methadone Scn, Ur NONE DETECTED NONE DETECTED    Comment: (NOTE) Tricyclics + metabolites, urine    Cutoff 1000 ng/mL Amphetamines + metabolites, urine  Cutoff 1000 ng/mL MDMA (Ecstasy), urine              Cutoff 500 ng/mL Cocaine Metabolite, urine          Cutoff 300 ng/mL Opiate + metabolites, urine        Cutoff 300 ng/mL Phencyclidine (PCP), urine         Cutoff 25 ng/mL Cannabinoid, urine                 Cutoff 50 ng/mL Barbiturates + metabolites, urine  Cutoff 200 ng/mL Benzodiazepine, urine              Cutoff 200 ng/mL Methadone, urine                   Cutoff 300 ng/mL The urine drug screen provides only a preliminary, unconfirmed analytical test result and should not be used for non-medical purposes. Clinical consideration and professional judgment should be applied to any positive drug screen result due to possible interfering substances. A more specific alternate chemical method must be used in order to obtain a confirmed analytical result. Gas chromatography / mass spectrometry (GC/MS) is the preferred confirmat ory method. Performed at [facility], [address] [place], [place] [address]   Ethanol     Status: None   Collection Time: 04/04/19  7:37 PM  Result Value Ref Range   Alcohol, Ethyl (B) <10 <10 mg/dL    Comment: (NOTE) Lowest detectable limit for serum alcohol is 10 mg/dL. For medical purposes only. Performed at [facility], [address] [place], [place] [address]   Comprehensive metabolic  panel     Status: Abnormal   Collection Time: 04/04/19  8:41 PM  Result Value Ref Range   Sodium 135 135 - 145 mmol/L   Potassium 3.6 3.5 - 5.1 mmol/L   Chloride 99 98 - 111 mmol/L   CO2 24 22 - 32 mmol/L   Glucose, Bld 116 (H) 70 - 99 mg/dL   BUN 26 (H) 8 - 23 mg/dL   Creatinine, Ser 6.571.45 (H) 0.61 - 1.24 mg/dL   Calcium 9.8 8.9 - 84.610.3 mg/dL   Total Protein 8.5 (H) 6.5 - 8.1 g/dL   Albumin 4.2 3.5 - 5.0 g/dL   AST 30 15 - 41 U/L   ALT 35 0 - 44 U/L   Alkaline Phosphatase 209 (H) 38 - 126 U/L   Total Bilirubin 1.8 (H) 0.3 - 1.2 mg/dL   GFR calc non Af Amer 51 (L) >60 mL/min   GFR calc Af Amer 59 (L) >60 mL/min   Anion gap 12 5 - 15    Comment: Performed at [facility], [address] [place], [place] [address]  Troponin I (High Sensitivity)     Status: Abnormal   Collection Time: 04/04/19  8:41 PM  Result Value Ref Range   Troponin I (High Sensitivity) 89 (H) <18 ng/L    Comment: READ BACK AND VERIFIED WITH AMY COYNE AT 2135 04/04/2019  TFK (NOTE) Elevated high  sensitivity troponin I (hsTnI) values and significant  changes across serial measurements may suggest ACS but many other  chronic and acute conditions are known to elevate hsTnI results.  Refer to the "Links" section for chest pain algorithms and additional  guidance. Performed at [facility], [address] [place], [place] [address]   SARS CORONAVIRUS 2 (TAT 6-24 HRS) Nasopharyngeal Nasopharyngeal Swab     Status: None   Collection Time: 04/04/19  9:21 PM   Specimen: Nasopharyngeal Swab  Result Value Ref Range   SARS Coronavirus 2 NEGATIVE NEGATIVE    Comment: (NOTE) SARS-CoV-2 target nucleic acids are NOT DETECTED. The SARS-CoV-2 RNA is generally detectable in upper and lower respiratory specimens during the acute phase of infection. Negative results do not preclude SARS-CoV-2 infection, do not rule out co-infections with other pathogens, and should not be used as the sole basis for  treatment or other patient management decisions. Negative results must be combined with clinical observations, patient history, and epidemiological information. The expected result is Negative. Fact Sheet for Patients: HairSlick.no Fact Sheet for Healthcare Providers: quierodirigir.com This test is not yet approved or cleared by the [place] FDA and  has been authorized for detection and/or diagnosis of SARS-CoV-2 by FDA under an Emergency Use Authorization (EUA). This EUA will remain  in effect (meaning this test can be used) for the duration of the COVID-19 declaration under Section 56 4(b)(1) of the Act, 21 U.S.C. section 360bbb-3(b)(1), unless the authorization is terminated or revoked sooner. Performed at [facility], [address], [place], [place] [address]   Lipid panel     Status: None   Collection Time: 04/05/19  5:21 AM  Result Value Ref Range   Cholesterol 167 0 - 200 mg/dL   Triglycerides 829 <562 mg/dL   HDL 56 >13 mg/dL   Total CHOL/HDL Ratio 3.0 RATIO   VLDL 25 0 - 40 mg/dL   LDL Cholesterol 86 0 - 99 mg/dL    Comment:        Total Cholesterol/HDL:CHD Risk Coronary Heart Disease Risk Table                     Men   Women  1/2 Average Risk   3.4   3.3  Average Risk       5.0   4.4  2 X Average Risk   9.6   7.1  3 X Average Risk  23.4   11.0        Use the calculated Patient Ratio above and the CHD Risk Table to determine the patient's CHD Risk.        ATP III CLASSIFICATION (LDL):  <100     mg/dL   Optimal  086-578  mg/dL   Near or Above                    Optimal  130-159  mg/dL   Borderline  469-629  mg/dL   High  >528     mg/dL   Very High Performed at [facility], [address] [place], [place] [address]    Ct Head Wo Contrast  Result Date: 04/04/2019 CLINICAL DATA:  Weakness and confusion EXAM: CT HEAD WITHOUT CONTRAST TECHNIQUE: Contiguous axial images were obtained  from the base of the skull through the vertex without intravenous contrast. COMPARISON:  CT 03/04/2019, MRI 06/14/2018 FINDINGS: Brain: Focal hypodensity within the left frontal white matter, new since 03/04/2019 comparison head CT.  No hemorrhage. No mass. Mild atrophy and small vessel ischemic changes of the white matter. Stable ventricle size Vascular: No hyperdense vessels.  Carotid vascular calcification Skull: Normal. Negative for fracture or focal lesion. Sinuses/Orbits: Old fractures of the medial walls of both orbits. Other: None IMPRESSION: 1. Focal hypodensity within the left frontal white matter, suspicious for acute to subacute infarct. Negative for hemorrhage. 2. Mild atrophy and small vessel ischemic changes of the white matter. Electronically Signed   By: Jasmine Pang M.D.   On: 04/04/2019 19:27   Dg Chest Port 1 View  Result Date: 04/04/2019 CLINICAL DATA:  Weakness and confusion. EXAM: PORTABLE CHEST 1 VIEW COMPARISON:  Radiographs 04/04/2019 and 03/04/2019. FINDINGS: 1853 hours. There is stable cardiomegaly and aortic atherosclerosis. Chronic interstitial prominence is similar to the study done earlier today and before. There is no superimposed airspace disease, pleural effusion or pneumothorax. The bones appear unchanged. IMPRESSION: Stable examination with cardiomegaly, vascular congestion and chronic interstitial prominence. No new findings. Electronically Signed   By: Carey Bullocks M.D.   On: 04/04/2019 19:23   Dg Chest Portable 1 View  Result Date: 04/04/2019 CLINICAL DATA:  Patient with cough and shortness of breath EXAM: PORTABLE CHEST 1 VIEW COMPARISON:  Chest radiograph 03/04/2019 FINDINGS: Monitoring leads overlie the patient. Stable cardiomegaly. Aortic atherosclerosis. No large area of pulmonary consolidation. Similar mild interstitial opacities. No pleural effusion or pneumothorax. Thoracic spine degenerative changes. IMPRESSION: Cardiomegaly. Similar mild interstitial  opacities may represent mild edema Electronically Signed   By: Annia Belt M.D.   On: 04/04/2019 08:10    Assessment: 63 y.o. male with history of CAD, CHF, HTN, HLD, atrial fibrillation on Xarelto, CVA admitted with right sided weakness and slurred speech in whom head ct shows a left frontal hypodensity most likely c/w subacute infarct.  Plan: 1. HgbA1c, fasting lipid panel 2. MRI, MRA  of the brain without contrast 3. PT consult, OT consult, Speech consult 4. Echocardiogram 5. Carotid dopplers: given finding of ct angio on 05/2018, consider cardiovascular consult 6. Prophylactic therapy-continue home xarelto 7. Risk factor modification 8. Telemetry monitoring 9. Frequent neuro checks  04/05/2019, 9:29 AM

## 2019-04-06 ENCOUNTER — Inpatient Hospital Stay: Payer: No Typology Code available for payment source

## 2019-04-06 ENCOUNTER — Encounter: Payer: Self-pay | Admitting: Radiology

## 2019-04-06 LAB — GLUCOSE, CAPILLARY
Glucose-Capillary: 201 mg/dL — ABNORMAL HIGH (ref 70–99)
Glucose-Capillary: 135 mg/dL — ABNORMAL HIGH (ref 70–99)
Glucose-Capillary: 158 mg/dL — ABNORMAL HIGH (ref 70–99)

## 2019-04-06 LAB — BASIC METABOLIC PANEL
Anion gap: 10 (ref 5–15)
BUN: 30 mg/dL — ABNORMAL HIGH (ref 8–23)
CO2: 23 mmol/L (ref 22–32)
Calcium: 9.3 mg/dL (ref 8.9–10.3)
Chloride: 104 mmol/L (ref 98–111)
Creatinine, Ser: 1.47 mg/dL — ABNORMAL HIGH (ref 0.61–1.24)
GFR calc Af Amer: 58 mL/min — ABNORMAL LOW (ref >60)
GFR calc non Af Amer: 50 mL/min — ABNORMAL LOW (ref >60)
Glucose, Bld: 141 mg/dL — ABNORMAL HIGH (ref 70–99)
Potassium: 3.5 mmol/L (ref 3.5–5.1)
Sodium: 137 mmol/L (ref 135–145)

## 2019-04-06 LAB — TROPONIN I (HIGH SENSITIVITY)
Troponin I (High Sensitivity): 56 ng/L — ABNORMAL HIGH (ref <18)
Troponin I (High Sensitivity): 60 ng/L — ABNORMAL HIGH (ref <18)

## 2019-04-06 MED ORDER — NITROGLYCERIN 0.4 MG SL SUBL
0.4000 mg | SUBLINGUAL_TABLET | SUBLINGUAL | Status: DC | PRN
  Administered 2019-04-06: 17:00:00 0.4 mg via SUBLINGUAL

## 2019-04-06 MED ORDER — HYDRALAZINE HCL 20 MG/ML IJ SOLN
10.0000 mg | Freq: Once | INTRAMUSCULAR | Status: AC
  Administered 2019-04-06: 10 mg via INTRAVENOUS
  Filled 2019-04-06: qty 1

## 2019-04-06 MED ORDER — NITROGLYCERIN 0.4 MG SL SUBL
SUBLINGUAL_TABLET | SUBLINGUAL | Status: AC
  Administered 2019-04-06: 17:00:00
  Filled 2019-04-06: qty 1

## 2019-04-06 MED ORDER — IOHEXOL 9 MG/ML PO SOLN
500.0000 mL | ORAL | Status: AC
  Administered 2019-04-06 (×2): 500 mL via ORAL

## 2019-04-06 MED ORDER — AMLODIPINE BESYLATE 10 MG PO TABS
10.0000 mg | ORAL_TABLET | Freq: Every day | ORAL | Status: DC
  Administered 2019-04-06 – 2019-04-08 (×3): 10 mg via ORAL
  Filled 2019-04-06 (×3): qty 1

## 2019-04-06 MED ORDER — INSULIN ASPART 100 UNIT/ML ~~LOC~~ SOLN
0.0000 [IU] | Freq: Three times a day (TID) | SUBCUTANEOUS | Status: DC
  Administered 2019-04-06: 18:00:00 1 [IU] via SUBCUTANEOUS
  Administered 2019-04-06: 3 [IU] via SUBCUTANEOUS
  Administered 2019-04-08 – 2019-04-09 (×4): 1 [IU] via SUBCUTANEOUS
  Administered 2019-04-10 (×2): 2 [IU] via SUBCUTANEOUS
  Administered 2019-04-10 – 2019-04-11 (×2): 1 [IU] via SUBCUTANEOUS
  Administered 2019-04-12 (×2): 2 [IU] via SUBCUTANEOUS
  Administered 2019-04-14 (×2): 1 [IU] via SUBCUTANEOUS
  Administered 2019-04-15 (×2): 2 [IU] via SUBCUTANEOUS
  Administered 2019-04-15: 13:00:00 1 [IU] via SUBCUTANEOUS
  Administered 2019-04-16: 2 [IU] via SUBCUTANEOUS
  Administered 2019-04-16: 13:00:00 1 [IU] via SUBCUTANEOUS
  Filled 2019-04-06 (×18): qty 1

## 2019-04-06 MED ORDER — TIOTROPIUM BROMIDE MONOHYDRATE 18 MCG IN CAPS
18.0000 ug | ORAL_CAPSULE | Freq: Every day | RESPIRATORY_TRACT | Status: DC
  Administered 2019-04-06 – 2019-04-11 (×6): 18 ug via RESPIRATORY_TRACT
  Filled 2019-04-06 (×2): qty 5

## 2019-04-06 MED ORDER — SENNOSIDES-DOCUSATE SODIUM 8.6-50 MG PO TABS
2.0000 | ORAL_TABLET | Freq: Every day | ORAL | Status: DC
  Administered 2019-04-06 – 2019-04-16 (×11): 2 via ORAL
  Filled 2019-04-06 (×10): qty 2

## 2019-04-06 MED ORDER — IOHEXOL 300 MG/ML  SOLN
100.0000 mL | Freq: Once | INTRAMUSCULAR | Status: AC | PRN
  Administered 2019-04-06: 100 mL via INTRAVENOUS

## 2019-04-06 MED ORDER — POLYETHYLENE GLYCOL 3350 17 G PO PACK
17.0000 g | PACK | Freq: Every day | ORAL | Status: DC
  Administered 2019-04-06 – 2019-04-14 (×7): 17 g via ORAL
  Filled 2019-04-06 (×7): qty 1

## 2019-04-06 MED ORDER — INSULIN ASPART 100 UNIT/ML ~~LOC~~ SOLN
0.0000 [IU] | Freq: Every day | SUBCUTANEOUS | Status: DC
  Administered 2019-04-08: 21:00:00 2 [IU] via SUBCUTANEOUS
  Filled 2019-04-06: qty 1

## 2019-04-06 MED ORDER — ONDANSETRON HCL 4 MG/2ML IJ SOLN
4.0000 mg | Freq: Four times a day (QID) | INTRAMUSCULAR | Status: DC | PRN
  Administered 2019-04-06: 4 mg via INTRAVENOUS
  Filled 2019-04-06: qty 2

## 2019-04-06 MED ORDER — DIAZEPAM 5 MG/ML IJ SOLN
5.0000 mg | Freq: Once | INTRAMUSCULAR | Status: DC
  Filled 2019-04-06: qty 2

## 2019-04-06 MED ORDER — CARVEDILOL 6.25 MG PO TABS
6.2500 mg | ORAL_TABLET | Freq: Two times a day (BID) | ORAL | Status: DC
  Administered 2019-04-06 – 2019-04-11 (×8): 6.25 mg via ORAL
  Filled 2019-04-06 (×8): qty 2

## 2019-04-06 NOTE — Evaluation (Addendum)
Speech Language Pathology Evaluation Patient Details Name: [name] MRN: [id number] DOB: 01/25/1956 Today's Date: 04/06/2019 Time: 1005-1100 SLP Time Calculation (min) (ACUTE ONLY): 55 min  Problem List:  Patient Active Problem List   Diagnosis Date Noted  . Persistent atrial fibrillation ([place])   . CHF (congestive heart failure) ([place]) 01/23/2019  . Acute on chronic systolic CHF (congestive heart failure) ([place]) 01/22/2019  . HTN (hypertension) 09/02/2018  . DM (diabetes mellitus) ([place]) 09/02/2018  . SOB (shortness of breath) 08/18/2018  . HFrEF (heart failure with reduced ejection fraction) ([place])   . Lactic acidosis 07/25/2018  . Hyperbilirubinemia 07/25/2018  . Stroke ([place]) 06/14/2018  . Carotid stenosis 06/13/2018  . Elevated troponin 06/13/2018  . Atrial fibrillation, chronic ([place]) 06/13/2018  . TIA (transient ischemic attack) 06/13/2018  . TIA involving carotid artery 06/13/2018  . Atrial flutter with rapid ventricular response ([place]) 02/18/2017  . CAD in native artery   . Chest pain 01/05/2017  . Ischemic cardiomyopathy 07/26/2016  . Non-sustained ventricular tachycardia ([place]) 07/26/2016  . COPD (chronic obstructive pulmonary disease) ([place]) 07/24/2016  . CAD (coronary artery disease) 07/24/2016  . HLD (hyperlipidemia) 07/24/2016  . Tobacco abuse 07/24/2016  . Hypertensive heart disease 07/24/2016   Past Medical History:  Past Medical History:  Diagnosis Date  . CAD in native artery    a. stress echo 12/2007 abnl, EF > 55%, b. LHC 01/28/08: mLAD 30, D1 40, dLCx 49, pRCA 30, mRCA 70, mRCA lesion 2 80, PDA 90, s/p PCI/BMS to prox and distal RCA, s/p PCI/BMS to PDA; c. patient reports PCI/stenting x 2 in early 2017 at the [place] (no records on file) d. 06/2016: cath showing patent stents along RCA and LCx with moderate 40% stenosis along the LAD.   [name] Carotid arterial disease ([place])    a. 05/2018 CTA Head/neck: 80-90% stenosis @ L carotid bifurcation, extending into the prox  LICA. 60% stenosis @ R carotid bifurcation. 50-75% bilateral distal cavernous carotid dzs.  . Chronic combined systolic (congestive) and diastolic (congestive) heart failure ([place])    a. 2009 Echo: > 55%; b. 06/2016: EF 25-30%; c. 12/2017 Echo: EF 30-35%, diff HK; d. 05/2018 Echo: 30-35%, mild conc LVH, diff HK. Mild MR. Mildly dil LA/RA.  [name] Chronic kidney disease   . COPD (chronic obstructive pulmonary disease) ([place])   . Diabetes mellitus without complication ([place])   . Gout   . Hyperlipidemia   . Hypertension   . Hypertensive heart disease   . Ischemic cardiomyopathy    a. 05/2018: 30-35%.  . Obesity   . Persistent atrial fibrillation ([place]) 01/2017   a. cardioverted 9/18 to NSR; b. 12/2017 noted to be back in Afib-outpt dccv rec; c. CHA2DS2VASc = 6-->Xarelto.  . Stroke (cerebrum) ([place])    a. 05/2018 MRI: small patchy acute to subacute cortial and white matter infarct in the bilat cerebrum. Mild chronic small vessel ischmia; b. 05/2018 Carotid U/S: L-carotid bifurcation 80-90% stenosed, R- carotid bifurcation 60% stenosed.  . Tobacco abuse    Past Surgical History:  Past Surgical History:  Procedure Laterality Date  . CARDIAC CATHETERIZATION  2009   [facility];   . CARDIAC CATHETERIZATION  2010  . CARDIAC CATHETERIZATION N/A 07/28/2016   Procedure: Right and Left Heart Cath and possible PCI;  Surgeon: Wellington Hampshire, MD;  Location: [facility];  Service: Cardiovascular;  Laterality: N/A;  . CARDIOVERSION N/A 03/27/2017   Procedure: CARDIOVERSION;  Surgeon: Wellington Hampshire, MD;  Location: [facility];  Service: Cardiovascular;  Laterality: N/A;  . CARDIOVERSION N/A 07/30/2018   Procedure: CARDIOVERSION;  Surgeon: Antonieta Iba, MD;  Location: [facility];  Service: Cardiovascular;  Laterality: N/A;  . CORONARY ANGIOPLASTY  2009   s/p stent placement at [facility].   HPI:  Pt is a 63 yo male who presented to ED 10/5 with some dysarthria and mild R sided weakness, pt treated earlier in the  day for SOB and CHF, CT scan shows left frontal infarct. PMH includes CAD, CKD, COPD, DM, HLD, HTN, persistent Afib, stroke 05/2018. Noted previous MRI results in 2019 and ongoing issues of CAD and stenosis per chart notes. Pt denied any swallowing deficits w/ current diet; NSG endorsed toleration of oral diet and swallowing Pills adequately.  Assessment / Plan / Recommendation Clinical Impression  Pt appears to present w/ Mild Dysarthria and Mild Cognitive-linguistic deficits primarily impacting Delayed Recall of information, Fluency(naming fruits but pt was fully aware of fruits he enjoyed/ate), and Visuoperception tasks; Executive Function tasks were not assessed today. Given time, and min verbal cues(semantic), pt was able to increase accuracy w/ tasks of Recall and Fluency. Suspect a more formal, focal assessment could better yield pt's precise Cognitive-linguistic status in light of impact of h/o CVA, and previous MRI results in 2019 indicating "mild chronic small vessel ischemia" baseline, and CT Angio of neck and head 05/2018 that showed Severe calcified and noncalcified plaque at the LEFT carotid w/ Occlusion.  Today, pt exhibited strengths in areas of Orientation, Calculation, Abstraction, Naming, and Attention w/ immediate task. Pt exhibits Slight Dysarthria which he says is improved today; decreased articulatory precision noted in connected speech vs individual speech tasks. Overall pt's speech was 90-100% intelligible -- increasing volume and overarticulating were added as strategies. Of note, pt is missing many Dentition which impact articulatory precision. Auditory Comprehension tasks and Expressive Language tasks were grossly [facility] given min time for processing; attention to task -- pt closed eyes at times but re-alerted and attended to each task appropriately. Pt stated he "could not wear his CPAP last night"; suspect this could be impactful. MD/NSG aware of pt's report of mild SOB sitting in  chair. OM exam revealed lingual/labial strength grossly WFL w/ no unilateral weakness noted; min decreased consecutive lingual-oral movements noted during connected speech in conversation: pt was able to utilize strategies of slowing and and focusing on pronounciation/words of sentence.  Overall, pt is able to follow through w/ strategies and goals to increase Cognitive-linguistic skills in ADLs; more formal assessment for POC and tx goals recommended at next venue of care to better ID pt's needs for maintaining as much independence as possible w/ his ADLs. Recommend skilled ST f/u at discharge to address POC/tx goals while monitoring pt's medical issues.      SLP Assessment  SLP Recommendation/Assessment: All further Speech Lanaguage Pathology  needs can be addressed in the next venue of care SLP Visit Diagnosis: Cognitive communication deficit (R41.841)    Follow Up Recommendations  (TBD if SNF vs Outpt)    Frequency and Duration  TBD at next venue of care        SLP Evaluation Cognition  Overall Cognitive Status: No family/caregiver present to determine baseline cognitive functioning(but grossly WFL for tasks assessed) Arousal/Alertness: Awake/alert(closed eyes at times but relarted) Orientation Level: Oriented X4;Oriented to person;Oriented to place;Oriented to time;Oriented to situation Attention: Focused;Sustained Focused Attention: Appears intact Sustained Attention: (closed eyes at times but re-alerted and attended) Memory: Impaired Memory Impairment: Decreased short term memory Decreased Short Term Memory:  Verbal complex;Functional complex Awareness: Appears intact(grossly) Problem Solving: (grossly) Executive Function: (DNT) Behaviors: (drowsy) Safety/Judgment: (difficult to determine)       Comprehension  Auditory Comprehension Overall Auditory Comprehension: Appears within functional limits for tasks assessed(for basic information) Yes/No Questions: Within Functional  Limits Commands: Within Functional Limits(1-2 step) Conversation: Simple Interfering Components: Processing speed(min) EffectiveTechniques: Extra processing time;Pausing Visual Recognition/Discrimination Discrimination: Not tested Reading Comprehension Reading Status: Within funtional limits(basic)    Expression Expression Primary Mode of Expression: Verbal Verbal Expression Overall Verbal Expression: Appears within functional limits for tasks assessed Initiation: No impairment Automatic Speech: Name;Social Response;Counting;Day of week Level of Generative/Spontaneous Verbalization: Sentence Repetition: No impairment Naming: No impairment Pragmatics: No impairment Non-Verbal Means of Communication: Not applicable Written Expression Dominant Hand: Right Written Expression: Not tested   Oral / Motor  Oral Motor/Sensory Function Overall Oral Motor/Sensory Function: Mild impairment Facial ROM: Within Functional Limits Facial Symmetry: Within Functional Limits Facial Strength: Within Functional Limits Lingual ROM: Within Functional Limits Lingual Symmetry: Within Functional Limits Lingual Strength: Reduced(rapid movements) Lingual Sensation: Within Functional Limits Velum: Within Functional Limits Mandible: Within Functional Limits Motor Speech Overall Motor Speech: Impaired(Mild) Respiration: Within functional limits Phonation: Normal Resonance: Within functional limits Articulation: Impaired Level of Impairment: Conversation Intelligibility: Intelligibility reduced Word: Other (comment)(slight) Conversation: 75-100% accurate(slight) Motor Planning: Witnin functional limits Motor Speech Errors: Not applicable Interfering Components: Inadequate dentition Effective Techniques: Slow rate;Increased vocal intensity;Over-articulate   GO                     Jerilynn Som , MS, CCC-SLP , 04/06/2019, 4:05 PM

## 2019-04-06 NOTE — Progress Notes (Signed)
10/07: No major neuro event overnight Patient had  Ahead ct in AM: when I interviewed the patient, patient was complaining of SOB, denies any new weakness or speech disturbances. Head ct: Unchanged appearance of probable subacute nonhemorrhagic stroke in the left frontal periventricular white matter compared with prior study of 04/04/2019. No evidence of acute intracranial hemorrhage or mass effect.  Patient  refused MRI  Neuro exam remains unchanged and stable Neurologic Examination: Alert, awake, following commands, slight dysrathria PERLA, EOMI, no nystagmus, VFF, face seems symmetrical, face sensation tot touch seems symmetrical, uvukla and tongue midline He is 4/5 in all extremities No sensory deficit appreciated No coordination deficit appreciated DTR not checked at time of examination Gait: Patient. Per pt, patient ambulating from bathroom to his bed  RECS: - Continue neuro protectives measures including normothermia, normoglycemia, correct electrolytes/metabolic abnlites, treat any infection - Would not control patient BP very tightly given his atherosclerosis to allow for some brain perfusion - Consider CV consult for optimization of his treatment regarding carotid occlusion if not candidate for surgery - Continue Xarelto - PT/OT - patient agreeble for MRI. Will f/up   10/06: initial consult Chief Complaint:  Left frontal stroke HPI: [name] is an 63 y.o. male with history of CAD, CHF, HTN, HLD, atrial fibrillation on Xarelto, CVA admitted with right sided weakness and slurred speech.  Patient is a limited historian.  Per  Note, patient seen in this ED earlier on day of admission  for SOB, found to have acute on chronic exacerbation of his heart failure. Received IV Lasix and discharged with plan to follow up in heart failure clinic. After leaving the emergency department he developed some slurred speech and right-sided weakness.  His home health nurse checked on him and  was concerned he may be experiencing a stroke, which prompted him to return to the emergency department. Patient states his symptoms started around 1 PM today.  He is on anticoagulation for his history of atrial fibrillation.  He reports a history of prior strokes with similar symptoms.  He denies any recent illnesses, no vomiting, diarrhea, urinary symptoms Head ct obtained in ER showing: Focal hypodensity within the left frontal white matter,suspicious for acute to subacute infarct. Negative for hemorrhage.Mild atrophy and small vessel ischemic changes of the white matter. Reviewing his chart, patient had an ct angio of neck and head on 05/2018 that showed Severe calcified and noncalcified plaque at the LEFT carotid bifurcation, extending into the proximal ICA. Estimated 80-90% stenosis.Non stenotic atheromatous change RIGHT carotid bifurcation, estimated 60% stenosis.no intracranial flow-limiting stenosis or large vessel occlusion.Significant calcified plaque both distal cavernous carotid arteries, estimated 50-75% stenosis bilaterally.Poor dentition, with numerous missing teeth, dental caries, andperiapical lucencies. Echo on 05/2018 with EF 30-35% with diffuse hypokinesis. Pateitn admitted for further management This AM, patient working with PT, standing and has walked around. States that he is sob  Past Medical History:  Diagnosis Date  . CAD in native artery    a. stress echo 12/2007 abnl, EF > 55%, b. LHC 01/28/08: mLAD 30, D1 40, dLCx 70, pRCA 30, mRCA 70, mRCA lesion 2 80, PDA 90, s/p PCI/BMS to prox and distal RCA, s/p PCI/BMS to PDA; c. patient reports PCI/stenting x 2 in early 2017 at the [place] (no records on file) d. 06/2016: cath showing patent stents along RCA and LCx with moderate 40% stenosis along the LAD.   [name] Carotid arterial disease (HCC)    a. 05/2018 CTA Head/neck: 80-90% stenosis @ L carotid  bifurcation, extending into the prox LICA. 60% stenosis @ R carotid bifurcation. 50-75%  bilateral distal cavernous carotid dzs.  . Chronic combined systolic (congestive) and diastolic (congestive) heart failure (HCC)    a. 2009 Echo: > 55%; b. 06/2016: EF 25-30%; c. 12/2017 Echo: EF 30-35%, diff HK; d. 05/2018 Echo: 30-35%, mild conc LVH, diff HK. Mild MR. Mildly dil LA/RA.  [name] Chronic kidney disease   . COPD (chronic obstructive pulmonary disease) (HCC)   . Diabetes mellitus without complication (HCC)   . Gout   . Hyperlipidemia   . Hypertension   . Hypertensive heart disease   . Ischemic cardiomyopathy    a. 05/2018: 30-35%.  . Obesity   . Persistent atrial fibrillation (HCC) 01/2017   a. cardioverted 9/18 to NSR; b. 12/2017 noted to be back in Afib-outpt dccv rec; c. CHA2DS2VASc = 6-->Xarelto.  . Stroke (cerebrum) (HCC)    a. 05/2018 MRI: small patchy acute to subacute cortial and white matter infarct in the bilat cerebrum. Mild chronic small vessel ischmia; b. 05/2018 Carotid U/S: L-carotid bifurcation 80-90% stenosed, R- carotid bifurcation 60% stenosed.  . Tobacco abuse     Past Surgical History:  Procedure Laterality Date  . CARDIAC CATHETERIZATION  2009   [facility];   . CARDIAC CATHETERIZATION  2010  . CARDIAC CATHETERIZATION N/A 07/28/2016   Procedure: Right and Left Heart Cath and possible PCI;  Surgeon: Iran Ouch, MD;  Location: [facility] INVASIVE CV LAB;  Service: Cardiovascular;  Laterality: N/A;  . CARDIOVERSION N/A 03/27/2017   Procedure: CARDIOVERSION;  Surgeon: Iran Ouch, MD;  Location: [facility] ORS;  Service: Cardiovascular;  Laterality: N/A;  . CARDIOVERSION N/A 07/30/2018   Procedure: CARDIOVERSION;  Surgeon: Antonieta Iba, MD;  Location: [facility] ORS;  Service: Cardiovascular;  Laterality: N/A;  . CORONARY ANGIOPLASTY  2009   s/p stent placement at [facility].    Family History  Problem Relation Age of Onset  . Heart attack Mother 17  . Hypertension Mother   . Cancer Brother    Social History:  reports that he has quit smoking. His smoking use included  cigarettes. He has a 35.00 pack-year smoking history. He has never used smokeless tobacco. He reports previous alcohol use. He reports current drug use.  Allergies:  Allergies  Allergen Reactions  . Aspirin Other (See Comments)    Told not to take because of blood thinner  . Entresto [Sacubitril-Valsartan] Other (See Comments)    Dizziness, weakness and stuttering  . Ibuprofen Nausea And Vomiting  . Tylenol [Acetaminophen] Nausea And Vomiting    Medications Prior to Admission  Medication Sig Dispense Refill  . atorvastatin (LIPITOR) 80 MG tablet Take 80 mg by mouth daily.    . budesonide-formoterol (SYMBICORT) 160-4.5 MCG/ACT inhaler Inhale 2 puffs into the lungs 2 (two) times daily.    . carvedilol (COREG) 6.25 MG tablet Take 1 tablet (6.25 mg total) by mouth 2 (two) times daily with a meal. 60 tablet 1  . magnesium oxide (MAG-OX) 400 MG tablet Take 400 mg by mouth 2 (two) times daily.    . methocarbamol (ROBAXIN) 500 MG tablet Take 500 mg by mouth 4 (four) times daily.    . nitroGLYCERIN (NITROSTAT) 0.4 MG SL tablet Place 0.4 mg under the tongue every 5 (five) minutes as needed for chest pain.    [name] oxyCODONE (OXY IR/ROXICODONE) 5 MG immediate release tablet Take 5 mg by mouth 2 (two) times a day.    . rivaroxaban (XARELTO) 20 MG TABS tablet  Take 20 mg by mouth daily with supper.    . sennosides-docusate sodium (SENOKOT-S) 8.6-50 MG tablet Take 2 tablets by mouth daily.    [name] spironolactone (ALDACTONE) 25 MG tablet Take 1 tablet (25 mg total) by mouth daily. 90 tablet 3  . Tiotropium Bromide Monohydrate 2.5 MCG/ACT AERS Inhale 2 puffs into the lungs daily.    . traZODone (DESYREL) 50 MG tablet Take 50 mg by mouth at bedtime.    . docusate sodium (COLACE) 100 MG capsule Take 1 capsule (100 mg total) by mouth 2 (two) times daily. (Patient not taking: Reported on 04/05/2019) 60 capsule 6  . rivaroxaban (XARELTO) 15 MG TABS tablet Take 1 tablet (15 mg total) by mouth daily with supper.  (Patient not taking: Reported on 04/05/2019) 30 tablet 1    ROS: As pr HPI  Physical Examination: Blood pressure (!) 126/114, pulse 88, temperature 97.8 F (36.6 C), temperature source Oral, resp. rate 15, height  (1.803 m), weight 99.8 kg, SpO2 95 %.  HEENT-  Normocephalic, no lesions, without obvious abnormality.  Normal external eye and conjunctiva.  Normal TM's bilaterally.  Normal auditory canals and external ears. Normal external nose, mucus membranes and septum.  Normal pharynx. Neck supple with no masses, nodes, nodules or enlargement. Cardiovascular - regular rate and rhythm, S1, S2 normal, no murmur, click, rub or gallop Lungs - chest clear, no wheezing, rales, normal symmetric air entry, Heart exam - S1, S2 normal, no murmur, no gallop, rate regular Abdomen - soft, non-tender; bowel sounds normal; no masses,  no organomegaly Extremities - 1+ edema in LEX  Neurologic Examination: Alert, awake, following commands, slight dysrathria PERLA, EOMI, no nystagmus, VFF, face seems symmetrical, face sensation tot touch seems symmetrical, uvukla and tongue midline He is 4/5 in all extremities No sensory deficit appreciated No coordination deficit appreciated DTR not checked at time of examination Gait: Patient walked around with PT   Results for orders placed or performed during the hospital encounter of 04/04/19 (from the past 48 hour(s))  CBC with Differential     Status: Abnormal   Collection Time: 04/04/19  7:37 PM  Result Value Ref Range   WBC 8.1 4.0 - 10.5 K/uL   RBC 4.37 4.22 - 5.81 MIL/uL   Hemoglobin 12.6 (L) 13.0 - 17.0 g/dL   HCT 16.1 09.6 - 04.5 %   MCV 90.4 80.0 - 100.0 fL   MCH 28.8 26.0 - 34.0 pg   MCHC 31.9 30.0 - 36.0 g/dL   RDW 40.9 (H) 81.1 - 91.4 %   Platelets 120 (L) 150 - 400 K/uL   nRBC 0.7 (H) 0.0 - 0.2 %   Neutrophils Relative % 72 %   Neutro Abs 5.8 1.7 - 7.7 K/uL   Lymphocytes Relative 20 %   Lymphs Abs 1.6 0.7 - 4.0 K/uL   Monocytes  Relative 6 %   Monocytes Absolute 0.5 0.1 - 1.0 K/uL   Eosinophils Relative 1 %   Eosinophils Absolute 0.1 0.0 - 0.5 K/uL   Basophils Relative 1 %   Basophils Absolute 0.1 0.0 - 0.1 K/uL   Immature Granulocytes 0 %   Abs Immature Granulocytes 0.01 0.00 - 0.07 K/uL    Comment: Performed at [facility], [address] [place], [place] [address]  Urinalysis, Complete w Microscopic     Status: Abnormal   Collection Time: 04/04/19  7:37 PM  Result Value Ref Range   Color, Urine YELLOW (A) YELLOW   APPearance CLEAR (A)  CLEAR   Specific Gravity, Urine 1.010 1.005 - 1.030   pH 5.0 5.0 - 8.0   Glucose, UA NEGATIVE NEGATIVE mg/dL   Hgb urine dipstick SMALL (A) NEGATIVE   Bilirubin Urine NEGATIVE NEGATIVE   Ketones, ur NEGATIVE NEGATIVE mg/dL   Protein, ur 30 (A) NEGATIVE mg/dL   Nitrite NEGATIVE NEGATIVE   Leukocytes,Ua NEGATIVE NEGATIVE   RBC / HPF 0-5 0 - 5 RBC/hpf   WBC, UA 0-5 0 - 5 WBC/hpf   Bacteria, UA NONE SEEN NONE SEEN   Squamous Epithelial / LPF 0-5 0 - 5   Mucus PRESENT    Hyaline Casts, UA PRESENT     Comment: Performed at [facility], [address], [place], [place] [address]  Urine culture     Status: None   Collection Time: 04/04/19  7:37 PM   Specimen: Urine, Random  Result Value Ref Range   Specimen Description      URINE, RANDOM Performed at [facility], [address] [place], [place] [address]    Special Requests      NONE Performed at [facility], [address], [place], [place] [address]    Culture      NO GROWTH Performed at [facility], [place] [address], [place], [place] [address]    Report Status 04/05/2019 FINAL   Urine Drug Screen, Qualitative     Status: None   Collection Time: 04/04/19  7:37 PM  Result Value Ref Range   Tricyclic, Ur Screen NONE DETECTED NONE DETECTED   Amphetamines, Ur Screen NONE DETECTED NONE DETECTED   MDMA (Ecstasy)Ur Screen NONE DETECTED NONE DETECTED   Cocaine  Metabolite,Ur [place] NONE DETECTED NONE DETECTED   Opiate, Ur Screen NONE DETECTED NONE DETECTED   Phencyclidine (PCP) Ur S NONE DETECTED NONE DETECTED   Cannabinoid 50 Ng, Ur [place] NONE DETECTED NONE DETECTED   Barbiturates, Ur Screen NONE DETECTED NONE DETECTED   Benzodiazepine, Ur Scrn NONE DETECTED NONE DETECTED   Methadone Scn, Ur NONE DETECTED NONE DETECTED    Comment: (NOTE) Tricyclics + metabolites, urine    Cutoff 1000 ng/mL Amphetamines + metabolites, urine  Cutoff 1000 ng/mL MDMA (Ecstasy), urine              Cutoff 500 ng/mL Cocaine Metabolite, urine          Cutoff 300 ng/mL Opiate + metabolites, urine        Cutoff 300 ng/mL Phencyclidine (PCP), urine         Cutoff 25 ng/mL Cannabinoid, urine                 Cutoff 50 ng/mL Barbiturates + metabolites, urine  Cutoff 200 ng/mL Benzodiazepine, urine              Cutoff 200 ng/mL Methadone, urine                   Cutoff 300 ng/mL The urine drug screen provides only a preliminary, unconfirmed analytical test result and should not be used for non-medical purposes. Clinical consideration and professional judgment should be applied to any positive drug screen result due to possible interfering substances. A more specific alternate chemical method must be used in order to obtain a confirmed analytical result. Gas chromatography / mass spectrometry (GC/MS) is the preferred confirmat ory method. Performed at [facility], [address], [place], [place] [address]   Ethanol     Status: None   Collection Time: 04/04/19  7:37 PM  Result Value Ref Range   Alcohol, Ethyl (B) <10 <10 mg/dL    Comment: (NOTE) Lowest detectable limit for serum alcohol is 10 mg/dL. For medical purposes only. Performed at [facility], [address] [place], [place] [address]   Comprehensive metabolic panel     Status: Abnormal   Collection Time: 04/04/19  8:41 PM  Result Value Ref Range   Sodium 135 135 - 145 mmol/L   Potassium  3.6 3.5 - 5.1 mmol/L   Chloride 99 98 - 111 mmol/L   CO2 24 22 - 32 mmol/L   Glucose, Bld 116 (H) 70 - 99 mg/dL   BUN 26 (H) 8 - 23 mg/dL   Creatinine, Ser 6.041.45 (H) 0.61 - 1.24 mg/dL   Calcium 9.8 8.9 - 54.010.3 mg/dL   Total Protein 8.5 (H) 6.5 - 8.1 g/dL   Albumin 4.2 3.5 - 5.0 g/dL   AST 30 15 - 41 U/L   ALT 35 0 - 44 U/L   Alkaline Phosphatase 209 (H) 38 - 126 U/L   Total Bilirubin 1.8 (H) 0.3 - 1.2 mg/dL   GFR calc non Af Amer 51 (L) >60 mL/min   GFR calc Af Amer 59 (L) >60 mL/min   Anion gap 12 5 - 15    Comment: Performed at [facility], [address] [place], [place] [address]  Troponin I (High Sensitivity)     Status: Abnormal   Collection Time: 04/04/19  8:41 PM  Result Value Ref Range   Troponin I (High Sensitivity) 89 (H) <18 ng/L    Comment: READ BACK AND VERIFIED WITH AMY COYNE AT 2135 04/04/2019  TFK (NOTE) Elevated high sensitivity troponin I (hsTnI) values and significant  changes across serial measurements may suggest ACS but many other  chronic and acute conditions are known to elevate hsTnI results.  Refer to the "Links" section for chest pain algorithms and additional  guidance. Performed at [facility], [address] [place], [place] [address]   SARS CORONAVIRUS 2 (TAT 6-24 HRS) Nasopharyngeal Nasopharyngeal Swab     Status: None   Collection Time: 04/04/19  9:21 PM   Specimen: Nasopharyngeal Swab  Result Value Ref Range   SARS Coronavirus 2 NEGATIVE NEGATIVE    Comment: (NOTE) SARS-CoV-2 target nucleic acids are NOT DETECTED. The SARS-CoV-2 RNA is generally detectable in upper and lower respiratory specimens during the acute phase of infection. Negative results do not preclude SARS-CoV-2 infection, do not rule out co-infections with other pathogens, and should not be used as the sole basis for treatment or other patient management decisions. Negative results must be combined with clinical observations, patient history, and  epidemiological information. The expected result is Negative. Fact Sheet for Patients: HairSlick.nohttps://www.fda.gov/media/138098/download Fact Sheet for Healthcare Providers: quierodirigir.comhttps://www.fda.gov/media/138095/download This test is not yet approved or cleared by the [place] FDA and  has been authorized for detection and/or diagnosis of SARS-CoV-2 by FDA under an Emergency Use Authorization (EUA). This EUA will remain  in effect (meaning this test can be used) for the duration of the COVID-19 declaration under Section 56 4(b)(1) of the Act, 21 U.S.C. section 360bbb-3(b)(1), unless the authorization is terminated or revoked sooner. Performed at [facility], [address] [place], [place] [address]   HIV Antibody (routine testing w rflx)     Status: None   Collection Time: 04/05/19  5:21 AM  Result Value Ref Range   HIV Screen 4th Generation wRfx NON REACTIVE NON  REACTIVE    Comment: Performed at [facility], [address], [place], [place] [address]  Hemoglobin A1c     Status: Abnormal   Collection Time: 04/05/19  5:21 AM  Result Value Ref Range   Hgb A1c MFr Bld 8.6 (H) 4.8 - 5.6 %    Comment: (NOTE) Pre diabetes:          5.7%-6.4% Diabetes:              >6.4% Glycemic control for   <7.0% adults with diabetes    Mean Plasma Glucose 200.12 mg/dL    Comment: Performed at [facility], [address], [place], [place] [address]  Lipid panel     Status: None   Collection Time: 04/05/19  5:21 AM  Result Value Ref Range   Cholesterol 167 0 - 200 mg/dL   Triglycerides 253 <664 mg/dL   HDL 56 >40 mg/dL   Total CHOL/HDL Ratio 3.0 RATIO   VLDL 25 0 - 40 mg/dL   LDL Cholesterol 86 0 - 99 mg/dL    Comment:        Total Cholesterol/HDL:CHD Risk Coronary Heart Disease Risk Table                     Men   Women  1/2 Average Risk   3.4   3.3  Average Risk       5.0   4.4  2 X Average Risk   9.6   7.1  3 X Average Risk  23.4   11.0        Use the calculated  Patient Ratio above and the CHD Risk Table to determine the patient's CHD Risk.        ATP III CLASSIFICATION (LDL):  <100     mg/dL   Optimal  347-425  mg/dL   Near or Above                    Optimal  130-159  mg/dL   Borderline  956-387  mg/dL   High  >564     mg/dL   Very High Performed at [facility], [address] [place], [place] [address]   Basic metabolic panel     Status: Abnormal   Collection Time: 04/06/19  5:35 AM  Result Value Ref Range   Sodium 137 135 - 145 mmol/L   Potassium 3.5 3.5 - 5.1 mmol/L   Chloride 104 98 - 111 mmol/L   CO2 23 22 - 32 mmol/L   Glucose, Bld 141 (H) 70 - 99 mg/dL   BUN 30 (H) 8 - 23 mg/dL   Creatinine, Ser 1.88 (H) 0.61 - 1.24 mg/dL   Calcium 9.3 8.9 - 41.6 mg/dL   GFR calc non Af Amer 50 (L) >60 mL/min   GFR calc Af Amer 58 (L) >60 mL/min   Anion gap 10 5 - 15    Comment: Performed at [facility], [address] [place], [place] [address]   Ct Head Wo Contrast  Result Date: 04/06/2019 CLINICAL DATA:  Subacute neuro deficits. Suspected stroke. History of atrial fibrillation, heart failure, diabetes and hypertension. EXAM: CT HEAD WITHOUT CONTRAST TECHNIQUE: Contiguous axial images were obtained from the base of the skull through the vertex without intravenous contrast. COMPARISON:  CT head 04/04/2019 and 03/04/2019. FINDINGS: Brain: There is no evidence of acute intracranial hemorrhage, mass lesion, brain edema or extra-axial fluid collection. The ventricles and subarachnoid  spaces are appropriately sized for age. The ill-defined low-density in the left frontal periventricular white matter is not significantly changed over the last 36 hours. No cortical abnormality identified. Vascular: Intracranial vascular calcifications. No hyperdense vessel identified. Skull: Negative for fracture or focal lesion. Sinuses/Orbits: The visualized paranasal sinuses and mastoid air cells are clear. No orbital abnormalities are seen.  Other: Stable deformities of the medial walls of both orbits. IMPRESSION: 1. Unchanged appearance of probable subacute nonhemorrhagic stroke in the left frontal periventricular white matter compared with prior study of 04/04/2019. 2. No evidence of acute intracranial hemorrhage or mass effect. Electronically Signed   By: Carey Bullocks M.D.   On: 04/06/2019 09:34   Ct Head Wo Contrast  Result Date: 04/04/2019 CLINICAL DATA:  Weakness and confusion EXAM: CT HEAD WITHOUT CONTRAST TECHNIQUE: Contiguous axial images were obtained from the base of the skull through the vertex without intravenous contrast. COMPARISON:  CT 03/04/2019, MRI 06/14/2018 FINDINGS: Brain: Focal hypodensity within the left frontal white matter, new since 03/04/2019 comparison head CT. No hemorrhage. No mass. Mild atrophy and small vessel ischemic changes of the white matter. Stable ventricle size Vascular: No hyperdense vessels.  Carotid vascular calcification Skull: Normal. Negative for fracture or focal lesion. Sinuses/Orbits: Old fractures of the medial walls of both orbits. Other: None IMPRESSION: 1. Focal hypodensity within the left frontal white matter, suspicious for acute to subacute infarct. Negative for hemorrhage. 2. Mild atrophy and small vessel ischemic changes of the white matter. Electronically Signed   By: Jasmine Pang M.D.   On: 04/04/2019 19:27   US Carotid Bilateral (at [facility] Only)  Result Date: 04/06/2019 CLINICAL DATA:  63 year old male with a history of stroke EXAM: BILATERAL CAROTID DUPLEX ULTRASOUND TECHNIQUE: Wallace Cullens scale imaging, color Doppler and duplex ultrasound were performed of bilateral carotid and vertebral arteries in the neck. COMPARISON:  None. FINDINGS: Criteria: Quantification of carotid stenosis is based on velocity parameters that correlate the residual internal carotid diameter with NASCET-based stenosis levels, using the diameter of the distal internal carotid lumen as the denominator for  stenosis measurement. The following velocity measurements were obtained: RIGHT ICA:  Systolic 109 cm/sec, Diastolic 27 cm/sec CCA:  96 cm/sec SYSTOLIC ICA/CCA RATIO:  1.1 ECA:  73 cm/sec LEFT ICA: Occluded CCA:  36 cm/sec SYSTOLIC ICA/CCA RATIO:  Not applicable ECA:  140 cm/sec Right Brachial SBP: Not acquired Left Brachial SBP: Not acquired RIGHT CAROTID ARTERY: No significant calcifications of the right common carotid artery. Intermediate waveform maintained. Heterogeneous and partially calcified plaque at the right carotid bifurcation. No significant lumen shadowing. Low resistance waveform of the right ICA. No significant tortuosity. RIGHT VERTEBRAL ARTERY: Antegrade flow with low resistance waveform. LEFT CAROTID ARTERY: No significant calcifications of the left common carotid artery. Intermediate waveform maintained. Heterogeneous and partially calcified plaque at the left carotid bifurcation. Occlusion of the ICA. LEFT VERTEBRAL ARTERY:  Antegrade flow with low resistance waveform. IMPRESSION: Right: Color duplex indicates minimal heterogeneous and calcified plaque, with no hemodynamically significant stenosis by duplex criteria in the extracranial cerebrovascular circulation. Left: Occlusion of the left internal carotid artery. Signed, Yvone Neu. Reyne Dumas, RPVI [facility] Electronically Signed   By: Gilmer Mor D.O.   On: 04/06/2019 07:18   Dg Chest Port 1 View  Result Date: 04/04/2019 CLINICAL DATA:  Weakness and confusion. EXAM: PORTABLE CHEST 1 VIEW COMPARISON:  Radiographs 04/04/2019 and 03/04/2019. FINDINGS: 1853 hours. There is stable cardiomegaly and aortic atherosclerosis. Chronic interstitial prominence  is similar to the study done earlier today and before. There is no superimposed airspace disease, pleural effusion or pneumothorax. The bones appear unchanged. IMPRESSION: Stable examination with cardiomegaly, vascular congestion and  chronic interstitial prominence. No new findings. Electronically Signed   By: Carey Bullocks M.D.   On: 04/04/2019 19:23    Assessment: 63 y.o. male with history of CAD, CHF, HTN, HLD, atrial fibrillation on Xarelto, CVA admitted with right sided weakness and slurred speech in whom head ct shows a left frontal hypodensity most likely c/w subacute infarct.  Plan: 1. HgbA1c, fasting lipid panel 2. MRI, MRA  of the brain without contrast 3. PT consult, OT consult, Speech consult 4. Echocardiogram 5. Carotid dopplers: given finding of ct angio on 05/2018, consider cardiovascular consult 6. Prophylactic therapy-continue home xarelto 7. Risk factor modification 8. Telemetry monitoring 9. Frequent neuro checks  04/06/2019, 10:30 AM

## 2019-04-06 NOTE — Progress Notes (Signed)
[employer] - [place] [facility] [place] [facility]                                                                                                                                                                                  Patient Demographics   [name], is a 63 y.o. male, DOB - 1955/07/05, BJY:[id number]  Admit date - 04/04/2019   Admitting Physician Oralia Manis, MD  Outpatient Primary MD for the patient is [facility]   LOS - 2  Subjective: Pt had right sided weakness, pt has been refusing mri  C/o abdominal distention     Review of Systems:   CONSTITUTIONAL: No documented fever. No fatigue, weakness. No weight gain, no weight loss.  EYES: No blurry or double vision.  ENT: No tinnitus. No postnasal drip. No redness of the oropharynx.  RESPIRATORY: Positive cough, no wheeze, no hemoptysis.  Positive dyspnea.  CARDIOVASCULAR: No chest pain. No orthopnea. No palpitations. No syncope.  GASTROINTESTINAL: No nausea, no vomiting or diarrhea. No abdominal pain. No melena or hematochezia. + abdominal distention GENITOURINARY: No dysuria or hematuria.  ENDOCRINE: No polyuria or nocturia. No heat or cold intolerance.  HEMATOLOGY: No anemia. No bruising. No bleeding.  INTEGUMENTARY: No rashes. No lesions.  MUSCULOSKELETAL: No arthritis. No swelling. No gout.  NEUROLOGIC: No numbness, tingling, or ataxia. No seizure-type activity.  PSYCHIATRIC: No anxiety. No insomnia. No ADD.    Vitals:   Vitals:   04/06/19 0809 04/06/19 1000 04/06/19 1100 04/06/19 1111  BP: (!) 126/114 (!) 138/98 (!) 126/114 128/85  Pulse:  80 88 77  Resp:  Temp:  98.6 F (37 C) (!) 97.4 F (36.3 C) 98.8 F (37.1 C)  TempSrc:  Oral Oral   SpO2:  100% 96% 100%  Weight:      Height:        Wt Readings from Last 3 Encounters:  04/05/19 99.8 kg  04/04/19 99.8 kg  03/14/19 97.5 kg     Intake/Output Summary (Last 24 hours) at 04/06/2019 1318 Last data filed at 04/05/2019  1700 Gross per 24 hour  Intake 240 ml  Output -  Net 240 ml    Physical Exam:   GENERAL: Pleasant-appearing in no apparent distress.  HEAD, EYES, EARS, NOSE AND THROAT: Atraumatic, normocephalic. Extraocular muscles are intact. Pupils equal and reactive to light. Sclerae anicteric. No conjunctival injection. No oro-pharyngeal erythema.  NECK: Supple. There is no jugular venous distention. No bruits, no lymphadenopathy, no thyromegaly.  HEART: Regular rate and rhythm,. No murmurs, no rubs, no clicks.  LUNGS: OCCASIONAL CRACKLES no wheezing no rhonchus ABDOMEN: Soft, flat, nontender, +distention Has good bowel sounds. No  hepatosplenomegaly appreciated.  EXTREMITIES: No evidence of any cyanosis, clubbing, or peripheral edema.  +2 pedal and radial pulses bilaterally.  NEUROLOGIC: The patient is alert, awake, and oriented x3 , right upper ext 4/5, right lower ext 5/5, b/l upper ext 5/5  SKIN: Moist and warm with no rashes appreciated.  Psych: Not anxious, depressed LN: No inguinal LN enlargement    Antibiotics   Anti-infectives (From admission, onward)   None      Medications   Scheduled Meds: . amLODipine  10 mg Oral Daily  . atorvastatin  80 mg Oral Daily  . carvedilol  6.25 mg Oral BID WC  . diazepam  5 mg Intravenous Once  . docusate sodium  100 mg Oral BID  . furosemide  40 mg Intravenous Q12H  . guaiFENesin  600 mg Oral BID  . insulin aspart  0-5 Units Subcutaneous QHS  . insulin aspart  0-9 Units Subcutaneous TID WC  . LORazepam  1 mg Intravenous Once  . magnesium oxide  400 mg Oral BID  . methocarbamol  500 mg Oral QID  . mometasone-formoterol  2 puff Inhalation BID  . oxyCODONE  5 mg Oral BID  . polyethylene glycol  17 g Oral Daily  . rivaroxaban  20 mg Oral Q supper  . senna-docusate  2 tablet Oral Daily  . spironolactone  25 mg Oral Daily  . tiotropium  18 mcg Inhalation Daily  . traZODone  50 mg Oral QHS   Continuous Infusions: PRN Meds:.bisacodyl,  hydrALAZINE, ipratropium-albuterol, simethicone   Data Review:   Micro Results Recent Results (from the past 240 hour(s))  Urine culture     Status: None   Collection Time: 04/04/19  7:37 PM   Specimen: Urine, Random  Result Value Ref Range Status   Specimen Description   Final    URINE, RANDOM Performed at [facility], [address], [place], [place] [address]    Special Requests   Final    NONE Performed at [facility], [address] [place], [place] [address]    Culture   Final    NO GROWTH Performed at [facility], [address] [place], [place] [address]    Report Status 04/05/2019 FINAL  Final  SARS CORONAVIRUS 2 (TAT 6-24 HRS) Nasopharyngeal Nasopharyngeal Swab     Status: None   Collection Time: 04/04/19  9:21 PM   Specimen: Nasopharyngeal Swab  Result Value Ref Range Status   SARS Coronavirus 2 NEGATIVE NEGATIVE Final    Comment: (NOTE) SARS-CoV-2 target nucleic acids are NOT DETECTED. The SARS-CoV-2 RNA is generally detectable in upper and lower respiratory specimens during the acute phase of infection. Negative results do not preclude SARS-CoV-2 infection, do not rule out co-infections with other pathogens, and should not be used as the sole basis for treatment or other patient management decisions. Negative results must be combined with clinical observations, patient history, and epidemiological information. The expected result is Negative. Fact Sheet for Patients: HairSlick.no Fact Sheet for Healthcare Providers: quierodirigir.com This test is not yet approved or cleared by the [place] FDA and  has been authorized for detection and/or diagnosis of SARS-CoV-2 by FDA under an Emergency Use Authorization (EUA). This EUA will remain  in effect (meaning this test can be used) for the duration of the COVID-19 declaration under Section 56 4(b)(1) of the Act, 21  U.S.C. section 360bbb-3(b)(1), unless the authorization is terminated or revoked sooner. Performed at [facility], [address] [place], [place] [address]     Radiology Reports Ct Head Wo Contrast  Result Date: 04/06/2019 CLINICAL DATA:  Subacute neuro deficits. Suspected stroke. History of atrial fibrillation, heart failure, diabetes and hypertension. EXAM: CT HEAD WITHOUT CONTRAST TECHNIQUE: Contiguous axial images were obtained from the base of the skull through the vertex without intravenous contrast. COMPARISON:  CT head 04/04/2019 and 03/04/2019. FINDINGS: Brain: There is no evidence of acute intracranial hemorrhage, mass lesion, brain edema or extra-axial fluid collection. The ventricles and subarachnoid spaces are appropriately sized for age. The ill-defined low-density in the left frontal periventricular white matter is not significantly changed over the last 36 hours. No cortical abnormality identified. Vascular: Intracranial vascular calcifications. No hyperdense vessel identified. Skull: Negative for fracture or focal lesion. Sinuses/Orbits: The visualized paranasal sinuses and mastoid air cells are clear. No orbital abnormalities are seen. Other: Stable deformities of the medial walls of both orbits. IMPRESSION: 1. Unchanged appearance of probable subacute nonhemorrhagic stroke in the left frontal periventricular white matter compared with prior study of 04/04/2019. 2. No evidence of acute intracranial hemorrhage or mass effect. Electronically Signed   By: Carey Bullocks M.D.   On: 04/06/2019 09:34   Ct Head Wo Contrast  Result Date: 04/04/2019 CLINICAL DATA:  Weakness and confusion EXAM: CT HEAD WITHOUT CONTRAST TECHNIQUE: Contiguous axial images were obtained from the base of the skull through the vertex without intravenous contrast. COMPARISON:  CT 03/04/2019, MRI 06/14/2018 FINDINGS: Brain: Focal hypodensity within the left frontal white matter, new since 03/04/2019  comparison head CT. No hemorrhage. No mass. Mild atrophy and small vessel ischemic changes of the white matter. Stable ventricle size Vascular: No hyperdense vessels.  Carotid vascular calcification Skull: Normal. Negative for fracture or focal lesion. Sinuses/Orbits: Old fractures of the medial walls of both orbits. Other: None IMPRESSION: 1. Focal hypodensity within the left frontal white matter, suspicious for acute to subacute infarct. Negative for hemorrhage. 2. Mild atrophy and small vessel ischemic changes of the white matter. Electronically Signed   By: Jasmine Pang M.D.   On: 04/04/2019 19:27   Ct Abdomen Pelvis W Contrast  Result Date: 04/06/2019 CLINICAL DATA:  Nausea and vomiting EXAM: CT ABDOMEN AND PELVIS WITH CONTRAST TECHNIQUE: Multidetector CT imaging of the abdomen and pelvis was performed using the standard protocol following bolus administration of intravenous contrast. CONTRAST:  OMNIPAQUE IOHEXOL 300 MG/ML  SOLN COMPARISON:  07/25/2018 FINDINGS: Lower chest: Mild cardiomegaly. Coronary atherosclerotic calcification. Hepatobiliary: Early phase imaging is essentially noncontrast. There is mosaic enhancement of the liver on the delayed phase with patchy low-density likely reflecting steatosis. There is borderline surface lobulation and the caudate lobe is large. No focal masslike area. Early hepatic venous reflux usually from elevated right heart pressure. No evidence of biliary obstruction or stone. Pancreas: Unremarkable. Spleen: Unremarkable. Adrenals/Urinary Tract: Negative adrenals. Asymmetric left renal atrophy that has developed since prior. No hydronephrosis or urinary stone. Unremarkable bladder. Stomach/Bowel: No obstruction. No evidence of bowel inflammation. Appendectomy. Vascular/Lymphatic: Extensive atherosclerotic calcification of the aorta, iliacs, and visceral branches. Bilateral superficial femoral artery occlusion and collapse. no mass or adenopathy. Reproductive:No  pathologic findings. Other: No ascites or pneumoperitoneum. Fatty right paramedian umbilical hernia. Musculoskeletal: No acute abnormalities. IMPRESSION: 1. No acute finding. 2. Heterogeneous liver density on the delayed phase, favor either patchy fat infiltration or passive congestion. There are equivocal findings which could reflect cirrhosis, please correlate for risk factors. 3. Left renal atrophy since January 2020, possible interval renal artery compromise. 4. Fatty periumbilical  hernia. 5. 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 Electronically Signed   By: Monte Fantasia M.D.   On: 04/06/2019 11:29   US Carotid Bilateral (at [facility] Only)  Result Date: 04/06/2019 CLINICAL DATA:  63 year old male with a history of stroke EXAM: BILATERAL CAROTID DUPLEX ULTRASOUND TECHNIQUE: Pearline Cables scale imaging, color Doppler and duplex ultrasound were performed of bilateral carotid and vertebral arteries in the neck. COMPARISON:  None. FINDINGS: Criteria: Quantification of carotid stenosis is based on velocity parameters that correlate the residual internal carotid diameter with NASCET-based stenosis levels, using the diameter of the distal internal carotid lumen as the denominator for stenosis measurement. The following velocity measurements were obtained: RIGHT ICA:  Systolic 588 cm/sec, Diastolic 27 cm/sec CCA:  96 cm/sec SYSTOLIC ICA/CCA RATIO:  1.1 ECA:  73 cm/sec LEFT ICA: Occluded CCA:  36 cm/sec SYSTOLIC ICA/CCA RATIO:  Not applicable ECA:  325 cm/sec Right Brachial SBP: Not acquired Left Brachial SBP: Not acquired RIGHT CAROTID ARTERY: No significant calcifications of the right common carotid artery. Intermediate waveform maintained. Heterogeneous and partially calcified plaque at the right carotid bifurcation. No significant lumen shadowing. Low resistance waveform of the right ICA. No significant tortuosity. RIGHT VERTEBRAL ARTERY: Antegrade flow with low resistance waveform. LEFT  CAROTID ARTERY: No significant calcifications of the left common carotid artery. Intermediate waveform maintained. Heterogeneous and partially calcified plaque at the left carotid bifurcation. Occlusion of the ICA. LEFT VERTEBRAL ARTERY:  Antegrade flow with low resistance waveform. IMPRESSION: Right: Color duplex indicates minimal heterogeneous and calcified plaque, with no hemodynamically significant stenosis by duplex criteria in the extracranial cerebrovascular circulation. Left: Occlusion of the left internal carotid artery. Signed, Dulcy Fanny. Dellia Nims, RPVI [facility] Electronically Signed   By: Corrie Mckusick D.O.   On: 04/06/2019 07:18   Dg Chest Port 1 View  Result Date: 04/04/2019 CLINICAL DATA:  Weakness and confusion. EXAM: PORTABLE CHEST 1 VIEW COMPARISON:  Radiographs 04/04/2019 and 03/04/2019. FINDINGS: 1853 hours. There is stable cardiomegaly and aortic atherosclerosis. Chronic interstitial prominence is similar to the study done earlier today and before. There is no superimposed airspace disease, pleural effusion or pneumothorax. The bones appear unchanged. IMPRESSION: Stable examination with cardiomegaly, vascular congestion and chronic interstitial prominence. No new findings. Electronically Signed   By: Richardean Sale M.D.   On: 04/04/2019 19:23   Dg Chest Portable 1 View  Result Date: 04/04/2019 CLINICAL DATA:  Patient with cough and shortness of breath EXAM: PORTABLE CHEST 1 VIEW COMPARISON:  Chest radiograph 03/04/2019 FINDINGS: Monitoring leads overlie the patient. Stable cardiomegaly. Aortic atherosclerosis. No large area of pulmonary consolidation. Similar mild interstitial opacities. No pleural effusion or pneumothorax. Thoracic spine degenerative changes. IMPRESSION: Cardiomegaly. Similar mild interstitial opacities may represent mild edema Electronically Signed   By: Lovey Newcomer M.D.   On: 04/04/2019 08:10      CBC Recent Labs  Lab 04/04/19 0748 04/04/19 1937  WBC 6.9 8.1  HGB 12.2* 12.6*  HCT 38.5* 39.5  PLT 105* 120*  MCV 90.8 90.4  MCH 28.8 28.8  MCHC 31.7 31.9  RDW 18.5* 18.6*  LYMPHSABS 1.0 1.6  MONOABS 0.3 0.5  EOSABS 0.0 0.1  BASOSABS 0.1 0.1    Chemistries  Recent Labs  Lab 04/04/19 0748 04/04/19 2041 04/06/19 0535  NA 137 135 137  K 4.1 3.6 3.5  CL 104 99 104  CO2 24 24 23   GLUCOSE 190* 116* 141*  BUN 21 26* 30*  CREATININE 1.29*  1.45* 1.47*  CALCIUM 9.2 9.8 9.3  AST  --  30  --   ALT  --  35  --   ALKPHOS  --  209*  --   BILITOT  --  1.8*  --    ------------------------------------------------------------------------------------------------------------------ estimated creatinine clearance is 62.7 mL/min (A) (by C-G formula based on SCr of 1.47 mg/dL (H)). ------------------------------------------------------------------------------------------------------------------ Recent Labs    04/05/19 0521  HGBA1C 8.6*   ------------------------------------------------------------------------------------------------------------------ Recent Labs    04/05/19 0521  CHOL 167  HDL 56  LDLCALC 86  TRIG 126  CHOLHDL 3.0   ------------------------------------------------------------------------------------------------------------------ No results for input(s): TSH, T4TOTAL, T3FREE, THYROIDAB in the last 72 hours.  Invalid input(s): FREET3 ------------------------------------------------------------------------------------------------------------------ No results for input(s): VITAMINB12, FOLATE, FERRITIN, TIBC, IRON, RETICCTPCT in the last 72 hours.  Coagulation profile No results for input(s): INR, PROTIME in the last 168 hours.  No results for input(s): DDIMER in the last 72 hours.  Cardiac Enzymes No results for input(s): CKMB, TROPONINI, MYOGLOBIN in the last 168 hours.  Invalid input(s):  CK ------------------------------------------------------------------------------------------------------------------ Invalid input(s): POCBNP    Assessment & Plan  Patient 63 year old presenting with acute stroke    1. Acute stroke (HCC) - Continue Xarelto Continue aspirin Pt refuses MRA, of neck , due to ckd CT neck will be ideal Carotid stenosis noted possible complete occlusion.  Vascular consult   2.  Persistent atrial fibrillation (HCC) -continue home meds including anticoagulation  3.   COPD (chronic obstructive pulmonary disease) (HCC) -continue home medications  4.  CAD in native artery -continue home meds  5.   HFrEF (heart failure with reduced ejection raction) (HCC) -patient's breathing is improved from this morning.  Continue home meds  6.   HTN (hypertension) -bp elevated continue coreg, will start Norvasc due to elevated blood pressure  7.   DM (diabetes mellitus) (HCC) -sliding scale insulin    8. HLD (hyperlipidemia) -home dose antilipid      Code Status Orders  (From admission, onward)         Start     Ordered   04/04/19 2324  Full code  Continuous     04/04/19 2323        Code Status History    Date Active Date Inactive Code Status Order ID Comments User Context   01/22/2019 0348 01/26/2019 1621 Full Code [id number]  Arnaldo Natal, MD Inpatient   08/18/2018 1028 08/20/2018 1510 Full Code [id number]  Adrian Saran, MD ED   07/26/2018 1207 07/31/2018 1751 Partial Code [id number]  Erin Fulling, MD Inpatient   07/25/2018 2020 07/26/2018 1207 Full Code [id number]  Gery Pray, MD Inpatient   06/13/2018 2049 06/15/2018 1706 Full Code [id number]  Gery Pray, MD Inpatient   01/19/2018 2358 01/21/2018 1343 Full Code [id number]  Cammy Copa, MD ED   03/26/2017 0636 03/27/2017 1729 Full Code [id number]  Arnaldo Natal, MD Inpatient   02/18/2017 2112 02/19/2017 1707 Full Code [id number]  Houston Siren, MD Inpatient   01/05/2017 2246 01/07/2017 1614 Full Code  [id number]  Altamese Dilling, MD Inpatient   07/24/2016 0235 07/31/2016 1401 Full Code [id number]  Oralia Manis, MD Inpatient   Advance Care Planning Activity    Advance Directive Documentation     Most Recent Value  Type of Advance Directive  Living will  Pre-existing out of facility DNR order (yellow form or pink MOST form)  -  "MOST" Form in Place?  -           Consults neurology  DVT Prophylaxis Xarelto  Lab Results  Component Value Date   PLT 120 (L) 04/04/2019     Time Spent in minutes  35min  Greater than 50% of time spent in care coordination and counseling patient regarding the condition and plan of care.   Auburn BilberryShreyang Stiles Maxcy M.D on 04/06/2019 at 1:18 PM  Between 7am to 6pm - Pager - [phone]  After 6pm go to [email or web address] - Social research officer, governmentpassword [email or web address]  [employer]   Office  [phone]

## 2019-04-06 NOTE — Progress Notes (Signed)
Physical Therapy Treatment Patient Details Name: [name] MRN: [id number] DOB: 07-09-1955 Today's Date: 04/06/2019    History of Present Illness 63 yo male presented to ED 10/5 with dysarthria and mild R sided weakness, pt treated earlier in the day for [facility] and CHF, CT scan shows left frontal infarct. PMH includes CAD, CKD, COPD, DM, HLD, HTN, persistent Afib, stroke 05/2018    PT Comments    Pt sitting EOB upon arrival with nursing present to give medications. Pt presents with increased agitation today and did not want to participate with PT although with persistent encouragement from PT and nursing pt agreed to participate minimally. Throughout entire session pt stated he was short of breath and was having difficulty breathing. SpO2 assessed throughout and maintained 97%-100%. Pt required min assist for transfers and ambulation in room today secondary to pt a little more unsteady today and frequently reaching out for counters and bedrails. Pt stopped during ambulation at the foot of the bed repeating he was going to fall however pt had BUE supported on bed rail, therapist guarding and no overt unsteadiness or LOB. Pt refused to stay sitting up in recliner asking to return to bed. Pt also frequently complained of being weak during session. Pt encouraged to perform LE exercises throughout the day with pt stating that he did not feel like doing anything. When trying to assess RUE strength after nursing reported this morning that he was having increased difficulty today and unable to feed himself or use that UE pt stated that we did not believe him. Pt would benefit from continued skilled therapy to improve deficits.    Follow Up Recommendations  Home health PT     Equipment Recommendations  None recommended by PT    Recommendations for Other Services       Precautions / Restrictions Precautions Precautions: Fall Restrictions Weight Bearing Restrictions: No    Mobility  Bed  Mobility Overal bed mobility: Modified Independent             General bed mobility comments: pt requires increased time and effort but independent with bed mobility  Transfers Overall transfer level: Needs assistance Equipment used: None Transfers: Sit to/from Stand Sit to Stand: Min assist         General transfer comment: pt required min assist via HHA to steady with initial standing, pt able to rise without physical assist  Ambulation/Gait Ambulation/Gait assistance: Min guard Gait Distance (Feet): 15 Feet Assistive device: None Gait Pattern/deviations: Step-through pattern;Decreased stride length;Antalgic;Trunk flexed Gait velocity: decreased   General Gait Details: pt only willing to ambulate around bed to sit in recliner,pt frequently reaching out with B UE to reach for counter and bed rails for stability, at foot of bed rounding the corner pt stopped and repeated that he was going to fall multiple times despite therapist guarding and pt having B UE on bed rail and standing still with no unsteadiness noted   Stairs             Wheelchair Mobility    Modified Rankin (Stroke Patients Only)       Balance Overall balance assessment: Needs assistance;History of Falls Sitting-balance support: Feet supported Sitting balance-Leahy Scale: Good Sitting balance - Comments: steady sitting EOB     Standing balance-Leahy Scale: Fair Standing balance comment: pt frequently reaching out to grab furniture however no over LOB noted  Cognition Arousal/Alertness: Awake/alert Behavior During Therapy: Agitated Overall Cognitive Status: Within Functional Limits for tasks assessed                                 General Comments: pt with increased agitation today not wanting to participate in any therapy, pt complained of SHOB entire session with SpO2 97-100%      Exercises Total Joint Exercises Ankle Circles/Pumps:  AROM;Both;10 reps Long Arc Quad: AROM;Both;10 reps Marching in Standing: AROM;Both;10 reps;Seated    General Comments        Pertinent Vitals/Pain Pain Score: 5  Pain Location: left knee Pain Descriptors / Indicators: Aching;Sore Pain Intervention(s): Limited activity within patient's tolerance;Monitored during session    Home Living                      Prior Function            PT Goals (current goals can now be found in the care plan section) Progress towards PT goals: Progressing toward goals    Frequency    7X/week      PT Plan Current plan remains appropriate    Co-evaluation              AM-PAC PT "6 Clicks" Mobility   Outcome Measure  Help needed turning from your back to your side while in a flat bed without using bedrails?: None Help needed moving from lying on your back to sitting on the side of a flat bed without using bedrails?: None Help needed moving to and from a bed to a chair (including a wheelchair)?: A Little Help needed standing up from a chair using your arms (e.g., wheelchair or bedside chair)?: A Little Help needed to walk in hospital room?: A Little Help needed climbing 3-5 steps with a railing? : A Little 6 Click Score: 20    End of Session Equipment Utilized During Treatment: Gait belt Activity Tolerance: Treatment limited secondary to agitation Patient left: in bed;with call bell/phone within reach Nurse Communication: Mobility status PT Visit Diagnosis: Difficulty in walking, not elsewhere classified (R26.2);Muscle weakness (generalized) (M62.81);Other symptoms and signs involving the nervous system (K48.185)     Time: 1420-1440 PT Time Calculation (min) (ACUTE ONLY): 20 min  Charges:  $Therapeutic Exercise: 8-22 mins                     Olumide Dolinger PT, DPT 3:03 PM,04/06/19 [phone]

## 2019-04-07 ENCOUNTER — Inpatient Hospital Stay: Payer: No Typology Code available for payment source

## 2019-04-07 ENCOUNTER — Other Ambulatory Visit (INDEPENDENT_AMBULATORY_CARE_PROVIDER_SITE_OTHER): Payer: Self-pay | Admitting: Vascular Surgery

## 2019-04-07 ENCOUNTER — Encounter: Payer: Self-pay | Admitting: Radiology

## 2019-04-07 DIAGNOSIS — I6522 Occlusion and stenosis of left carotid artery: Secondary | ICD-10-CM

## 2019-04-07 LAB — GLUCOSE, CAPILLARY
Glucose-Capillary: 120 mg/dL — ABNORMAL HIGH (ref 70–99)
Glucose-Capillary: 460 mg/dL — ABNORMAL HIGH (ref 70–99)
Glucose-Capillary: 92 mg/dL (ref 70–99)
Glucose-Capillary: 181 mg/dL — ABNORMAL HIGH (ref 70–99)

## 2019-04-07 LAB — BASIC METABOLIC PANEL
Anion gap: 11 (ref 5–15)
BUN: 31 mg/dL — ABNORMAL HIGH (ref 8–23)
CO2: 23 mmol/L (ref 22–32)
Calcium: 9.2 mg/dL (ref 8.9–10.3)
Chloride: 104 mmol/L (ref 98–111)
Creatinine, Ser: 1.59 mg/dL — ABNORMAL HIGH (ref 0.61–1.24)
GFR calc Af Amer: 53 mL/min — ABNORMAL LOW (ref >60)
GFR calc non Af Amer: 46 mL/min — ABNORMAL LOW (ref >60)
Glucose, Bld: 127 mg/dL — ABNORMAL HIGH (ref 70–99)
Potassium: 4 mmol/L (ref 3.5–5.1)
Sodium: 138 mmol/L (ref 135–145)

## 2019-04-07 LAB — CBC
HCT: 38.3 % — ABNORMAL LOW (ref 39.0–52.0)
Hemoglobin: 12.2 g/dL — ABNORMAL LOW (ref 13.0–17.0)
MCH: 29 pg (ref 26.0–34.0)
MCHC: 31.9 g/dL (ref 30.0–36.0)
MCV: 91 fL (ref 80.0–100.0)
Platelets: 117 10*3/uL — ABNORMAL LOW (ref 150–400)
RBC: 4.21 MIL/uL — ABNORMAL LOW (ref 4.22–5.81)
RDW: 19.8 % — ABNORMAL HIGH (ref 11.5–15.5)
WBC: 6.1 10*3/uL (ref 4.0–10.5)
nRBC: 1.2 % — ABNORMAL HIGH (ref 0.0–0.2)

## 2019-04-07 MED ORDER — IOHEXOL 350 MG/ML SOLN
75.0000 mL | Freq: Once | INTRAVENOUS | Status: AC | PRN
  Administered 2019-04-07: 10:00:00 75 mL via INTRAVENOUS

## 2019-04-07 MED ORDER — SODIUM CHLORIDE 0.9 % IV SOLN
INTRAVENOUS | Status: DC
  Administered 2019-04-08 – 2019-04-09 (×3): via INTRAVENOUS

## 2019-04-07 MED ORDER — INSULIN ASPART 100 UNIT/ML ~~LOC~~ SOLN
14.0000 [IU] | Freq: Once | SUBCUTANEOUS | Status: AC
  Administered 2019-04-07: 12:00:00 14 [IU] via SUBCUTANEOUS
  Filled 2019-04-07: qty 1

## 2019-04-07 MED ORDER — DILTIAZEM HCL ER COATED BEADS 120 MG PO CP24
120.0000 mg | ORAL_CAPSULE | Freq: Every day | ORAL | Status: DC
  Administered 2019-04-07 – 2019-04-10 (×4): 120 mg via ORAL
  Filled 2019-04-07 (×5): qty 1

## 2019-04-07 MED ORDER — INSULIN GLARGINE 100 UNIT/ML ~~LOC~~ SOLN
10.0000 [IU] | Freq: Every day | SUBCUTANEOUS | Status: DC
  Administered 2019-04-07 – 2019-04-16 (×9): 10 [IU] via SUBCUTANEOUS
  Filled 2019-04-07 (×11): qty 0.1

## 2019-04-07 MED ORDER — DIAZEPAM 5 MG PO TABS
5.0000 mg | ORAL_TABLET | Freq: Once | ORAL | Status: AC
  Administered 2019-04-07: 13:00:00 5 mg via ORAL
  Filled 2019-04-07: qty 1

## 2019-04-07 MED ORDER — CEFAZOLIN SODIUM-DEXTROSE 2-4 GM/100ML-% IV SOLN
2.0000 g | Freq: Once | INTRAVENOUS | Status: AC
  Administered 2019-04-08 (×2): 2 g via INTRAVENOUS
  Filled 2019-04-07 (×2): qty 100

## 2019-04-07 MED ORDER — SODIUM CHLORIDE 0.9 % IV BOLUS
500.0000 mL | Freq: Once | INTRAVENOUS | Status: AC
  Administered 2019-04-07: 500 mL via INTRAVENOUS

## 2019-04-07 NOTE — Progress Notes (Signed)
OT Cancellation Note  Patient Details Name: [name] MRN: [id number] DOB: [birth year]   Cancelled Treatment:    Reason Eval/Treat Not Completed: Patient at procedure or test/ unavailable(OT treatment attempted. Pt. was out of his room, and not available. Will reattempt at a later time/date.)  Harrel Carina, MS, OTR/L 04/07/2019, 10:50 AM

## 2019-04-07 NOTE — Consult Note (Addendum)
[facility] Vascular Consult Note  MRN : [id number]  [name] is a 63 y.o. ([birth year]) male who presents with chief complaint of  Chief Complaint  Patient presents with  . Weakness   History of Present Illness:  The patient is a 63 year old male with multiple medical issues (see below) including known carotid artery stenosis who presented [name] emergency department complaining of right-sided weakness.  The patient is known to our service.  We have seen him in consult approximately a year ago for right sided weakness and acute CVA.  CTA of the neck on June 13, 2018 was notable for:  Severe calcified and noncalcified plaque at the LEFT carotid bifurcation, extending into the proximal ICA. Estimated 80-90% stenosis.  In the setting of an acute CVA plan was to move forward with a stent in approximately 2 to 3 weeks.  Unfortunately, the patient did not follow-up in our office/move forward with our recommendations.  Patient's partner was present in the room and notes that the patient has been having what sounds like TIA like symptoms for over the past year.  His partner notes that he continues to smoke tobacco daily and has not followed up with his primary care physician in over a year.  Partner also notes that she has tried to encourage him to seek medical attention for these TIA-like symptoms however he generally refuses.  Patient now presents again with right-sided weakness.  Carotid duplex conducted on 04/05/19: Right: Color duplex indicates minimal heterogeneous and calcified plaque, with no hemodynamically significant stenosis by duplex criteria in the extracranial cerebrovascular circulation. Left:  Occlusion of the left internal carotid artery.  Vascular surgery was consulted by Dr. Posey Pronto for further recommendations  Current Facility-Administered Medications  Medication Dose Route Frequency Provider Last Rate Last Dose  .  amLODipine (NORVASC) tablet 10 mg  10 mg Oral Daily Dustin Flock, MD   10 mg at 04/07/19 1101  . atorvastatin (LIPITOR) tablet 80 mg  80 mg Oral Daily Lance Coon, MD   80 mg at 04/06/19 1819  . bisacodyl (DULCOLAX) EC tablet 5 mg  5 mg Oral Daily PRN Lance Coon, MD   5 mg at 04/06/19 0046  . carvedilol (COREG) tablet 6.25 mg  6.25 mg Oral BID WC Dustin Flock, MD   6.25 mg at 04/07/19 1059  . diazepam (VALIUM) tablet 5 mg  5 mg Oral Once Henreitta Leber, MD      . furosemide (LASIX) injection 40 mg  40 mg Intravenous Q12H Dustin Flock, MD   40 mg at 04/07/19 1104  . guaiFENesin (MUCINEX) 12 hr tablet 600 mg  600 mg Oral BID Dustin Flock, MD   600 mg at 04/07/19 1102  . hydrALAZINE (APRESOLINE) injection 10 mg  10 mg Intravenous Q6H PRN Dustin Flock, MD   10 mg at 04/05/19 1541  . insulin aspart (novoLOG) injection 0-5 Units  0-5 Units Subcutaneous QHS Dustin Flock, MD      . insulin aspart (novoLOG) injection 0-9 Units  0-9 Units Subcutaneous TID WC Dustin Flock, MD   1 Units at 04/06/19 1821  . ipratropium-albuterol (DUONEB) 0.5-2.5 (3) MG/3ML nebulizer solution 3 mL  3 mL Nebulization Q6H PRN Dustin Flock, MD   3 mL at 04/06/19 2121  . LORazepam (ATIVAN) injection 1 mg  1 mg Intravenous Once Harrie Foreman, MD      . magnesium oxide (MAG-OX) tablet 400 mg  400 mg Oral BID Dustin Flock, MD  400 mg at 04/07/19 1101  . methocarbamol (ROBAXIN) tablet 500 mg  500 mg Oral QID Auburn Bilberry, MD   500 mg at 04/07/19 1103  . mometasone-formoterol (DULERA) 200-5 MCG/ACT inhaler 2 puff  2 puff Inhalation BID Oralia Manis, MD   2 puff at 04/07/19 1059  . nitroGLYCERIN (NITROSTAT) SL tablet 0.4 mg  0.4 mg Sublingual Q5 min PRN Auburn Bilberry, MD   0.4 mg at 04/06/19 1633  . ondansetron (ZOFRAN) injection 4 mg  4 mg Intravenous Q6H PRN Jimmye Norman, NP   4 mg at 04/06/19 2240  . oxyCODONE (Oxy IR/ROXICODONE) immediate release tablet 5 mg  5 mg Oral BID Auburn Bilberry, MD   5 mg at 04/07/19 1101  . polyethylene glycol (MIRALAX / GLYCOLAX) packet 17 g  17 g Oral Daily Auburn Bilberry, MD   17 g at 04/07/19 1104  . rivaroxaban (XARELTO) tablet 20 mg  20 mg Oral Q supper Oralia Manis, MD   20 mg at 04/06/19 1818  . senna-docusate (Senokot-S) tablet 2 tablet  2 tablet Oral Daily Auburn Bilberry, MD   2 tablet at 04/07/19 1058  . simethicone (MYLICON) chewable tablet 80 mg  80 mg Oral QID PRN Milagros Loll, MD   80 mg at 04/06/19 1008  . spironolactone (ALDACTONE) tablet 25 mg  25 mg Oral Daily Auburn Bilberry, MD   25 mg at 04/07/19 1100  . tiotropium (SPIRIVA) inhalation capsule ([facility] use ONLY) 18 mcg  18 mcg Inhalation Daily Auburn Bilberry, MD   18 mcg at 04/07/19 1100  . traZODone (DESYREL) tablet 50 mg  50 mg Oral QHS Auburn Bilberry, MD   50 mg at 04/06/19 2120   Past Medical History:  Diagnosis Date  . CAD in native artery    a. stress echo 12/2007 abnl, EF > 55%, b. LHC 01/28/08: mLAD 30, D1 40, dLCx 70, pRCA 30, mRCA 70, mRCA lesion 2 80, PDA 90, s/p PCI/BMS to prox and distal RCA, s/p PCI/BMS to PDA; c. patient reports PCI/stenting x 2 in early 2017 at the [place] (no records on file) d. 06/2016: cath showing patent stents along RCA and LCx with moderate 40% stenosis along the LAD.   [name] Carotid arterial disease (HCC)    a. 05/2018 CTA Head/neck: 80-90% stenosis @ L carotid bifurcation, extending into the prox LICA. 60% stenosis @ R carotid bifurcation. 50-75% bilateral distal cavernous carotid dzs.  . Chronic combined systolic (congestive) and diastolic (congestive) heart failure (HCC)    a. 2009 Echo: > 55%; b. 06/2016: EF 25-30%; c. 12/2017 Echo: EF 30-35%, diff HK; d. 05/2018 Echo: 30-35%, mild conc LVH, diff HK. Mild MR. Mildly dil LA/RA.  [name] Chronic kidney disease   . COPD (chronic obstructive pulmonary disease) (HCC)   . Diabetes mellitus without complication (HCC)   . Gout   . Hyperlipidemia   . Hypertension   . Hypertensive heart disease   .  Ischemic cardiomyopathy    a. 05/2018: 30-35%.  . Obesity   . Persistent atrial fibrillation (HCC) 01/2017   a. cardioverted 9/18 to NSR; b. 12/2017 noted to be back in Afib-outpt dccv rec; c. CHA2DS2VASc = 6-->Xarelto.  . Stroke (cerebrum) (HCC)    a. 05/2018 MRI: small patchy acute to subacute cortial and white matter infarct in the bilat cerebrum. Mild chronic small vessel ischmia; b. 05/2018 Carotid U/S: L-carotid bifurcation 80-90% stenosed, R- carotid bifurcation 60% stenosed.  . Tobacco abuse    Past Surgical History:  Procedure Laterality  Date  . CARDIAC CATHETERIZATION  2009   [facility];   . CARDIAC CATHETERIZATION  2010  . CARDIAC CATHETERIZATION N/A 07/28/2016   Procedure: Right and Left Heart Cath and possible PCI;  Surgeon: Iran Ouch, MD;  Location: [facility] INVASIVE CV LAB;  Service: Cardiovascular;  Laterality: N/A;  . CARDIOVERSION N/A 03/27/2017   Procedure: CARDIOVERSION;  Surgeon: Iran Ouch, MD;  Location: [facility] ORS;  Service: Cardiovascular;  Laterality: N/A;  . CARDIOVERSION N/A 07/30/2018   Procedure: CARDIOVERSION;  Surgeon: Antonieta Iba, MD;  Location: [facility] ORS;  Service: Cardiovascular;  Laterality: N/A;  . CORONARY ANGIOPLASTY  2009   s/p stent placement at [facility].   Social History Social History   Tobacco Use  . Smoking status: Former Smoker    Packs/day: 1.00    Years: 35.00    Pack years: 35.00    Types: Cigarettes  . Smokeless tobacco: Never Used  Substance Use Topics  . Alcohol use: Not Currently    Comment: Quit 8 yrs.  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 Drug use: Yes    Comment: prescribed oxy   Family History Family History  Problem Relation Age of Onset  . Heart attack Mother 67  . Hypertension Mother   . Cancer Brother   Denies family history of peripheral artery disease, venous disease or renal disease.  Allergies  Allergen Reactions  . Aspirin Other (See Comments)    Told not to take because of blood thinner  . Entresto  [Sacubitril-Valsartan] Other (See Comments)    Dizziness, weakness and stuttering  . Ibuprofen Nausea And Vomiting  . Tylenol [Acetaminophen] Nausea And Vomiting   REVIEW OF SYSTEMS (Negative unless checked)  Constitutional: Weight loss  Fever  Chills Cardiac: Chest pain   Chest pressure   Palpitations   Shortness of breath when laying flat   Shortness of breath at rest   Shortness of breath with exertion. Vascular:  Pain in legs with walking   Pain in legs at rest   Pain in legs when laying flat   Claudication   Pain in feet when walking  Pain in feet at rest  Pain in feet when laying flat   History of DVT   Phlebitis   Swelling in legs   Varicose veins   Non-healing ulcers Pulmonary:   Uses home oxygen   Productive cough   Hemoptysis   Wheeze  COPD   Asthma Neurologic:  Dizziness  Blackouts   Seizures   History of stroke   History of TIA  Aphasia   Temporary blindness   Dysphagia   Weakness or numbness in arms   Weakness or numbness in legs Musculoskeletal:  Arthritis   Joint swelling   Joint pain   Low back pain Hematologic:  Easy bruising  Easy bleeding   Hypercoagulable state   Anemic  Hepatitis Gastrointestinal:  Blood in stool   Vomiting blood  Gastroesophageal reflux/heartburn   Difficulty swallowing. Genitourinary:  Chronic kidney disease   Difficult urination  Frequent urination  Burning with urination   Blood in urine Skin:  Rashes   Ulcers   Wounds Psychological:  History of anxiety    History of major depression.  Physical Examination  Vitals:   04/06/19 2127 04/07/19 0012 04/07/19 0530 04/07/19 1054  BP:  109/77 133/90 (!) 134/100  Pulse: (!) 117 (!) 117 (!) 122 (!) 121  Resp:  Temp:   98.1 F (36.7 C) 97.6 F (36.4 C)  TempSrc:  SpO2: 100% 98% 96% 100%  Weight:      Height:       Body mass index is 30.69 kg/m. Gen:  WD/WN,  NAD Head: [place]/AT, No temporalis wasting. Prominent temp pulse not noted. Ear/Nose/Throat: Hearing grossly intact, nares w/o erythema or drainage, oropharynx w/o Erythema/Exudate Eyes: Sclera non-icteric, conjunctiva clear Neck: Trachea midline.  No JVD. No bruits. Pulmonary:  Good air movement, respirations not labored, equal bilaterally.  Cardiac: RRR, normal S1, S2. Vascular:  Vessel Right Left  Radial Palpable Palpable  Ulnar Palpable Palpable  Brachial Palpable Palpable  Carotid Palpable, without bruit Palpable, without bruit  Aorta Not palpable N/A  Femoral Palpable Palpable  Popliteal Palpable Palpable  PT Palpable Palpable  DP Palpable Palpable   Gastrointestinal: soft, non-tender/non-distended. No guarding/reflex.  Musculoskeletal: M/S 5/5 throughout.  Extremities without ischemic changes.  No deformity or atrophy. No edema. Neurologic: Sensation grossly intact in extremities.  Symmetrical.  Speech is fluent. Motor exam as listed above. Psychiatric: Judgment intact, Mood & affect appropriate for pt's clinical situation. Dermatologic: No rashes or ulcers noted.  No cellulitis or open wounds. Lymph : No Cervical, Axillary, or Inguinal lymphadenopathy.  CBC Lab Results  Component Value Date   WBC 6.1 04/07/2019   HGB 12.2 (L) 04/07/2019   HCT 38.3 (L) 04/07/2019   MCV 91.0 04/07/2019   PLT 117 (L) 04/07/2019   BMET    Component Value Date/Time   NA 138 04/07/2019 0638   NA 137 09/09/2018 1059   NA 141 12/11/2012 1214   K 4.0 04/07/2019 0638   K 4.2 12/11/2012 1214   CL 104 04/07/2019 0638   CL 108 (H) 12/11/2012 1214   CO2 23 04/07/2019 0638   CO2 28 12/11/2012 1214   GLUCOSE 127 (H) 04/07/2019 0638   GLUCOSE 112 (H) 12/11/2012 1214   BUN 31 (H) 04/07/2019 0638   BUN 24 09/09/2018 1059   BUN 18 12/11/2012 1214   CREATININE 1.59 (H) 04/07/2019 0638   CREATININE 1.26 12/11/2012 1214   CALCIUM 9.2 04/07/2019 0638   CALCIUM 8.8 12/11/2012 1214   GFRNONAA 46 (L)  04/07/2019 0638   GFRNONAA >60 12/11/2012 1214   GFRAA 53 (L) 04/07/2019 0638   GFRAA >60 12/11/2012 1214   Estimated Creatinine Clearance: 58 mL/min (A) (by C-G formula based on SCr of 1.59 mg/dL (H)).  COAG Lab Results  Component Value Date   INR 2.00 07/26/2018   INR 1.51 07/25/2018   INR 2.47 06/13/2018   Radiology Ct Head Wo Contrast  Result Date: 04/06/2019 CLINICAL DATA:  Subacute neuro deficits. Suspected stroke. History of atrial fibrillation, heart failure, diabetes and hypertension. EXAM: CT HEAD WITHOUT CONTRAST TECHNIQUE: Contiguous axial images were obtained from the base of the skull through the vertex without intravenous contrast. COMPARISON:  CT head 04/04/2019 and 03/04/2019. FINDINGS: Brain: There is no evidence of acute intracranial hemorrhage, mass lesion, brain edema or extra-axial fluid collection. The ventricles and subarachnoid spaces are appropriately sized for age. The ill-defined low-density in the left frontal periventricular white matter is not significantly changed over the last 36 hours. No cortical abnormality identified. Vascular: Intracranial vascular calcifications. No hyperdense vessel identified. Skull: Negative for fracture or focal lesion. Sinuses/Orbits: The visualized paranasal sinuses and mastoid air cells are clear. No orbital abnormalities are seen. Other: Stable deformities of the medial walls of both orbits. IMPRESSION: 1. Unchanged appearance of probable subacute nonhemorrhagic stroke in the left frontal periventricular white matter compared with prior study of 04/04/2019. 2. No evidence  of acute intracranial hemorrhage or mass effect. Electronically Signed   By: Carey BullocksWilliam  Veazey M.D.   On: 04/06/2019 09:34   Ct Head Wo Contrast  Result Date: 04/04/2019 CLINICAL DATA:  Weakness and confusion EXAM: CT HEAD WITHOUT CONTRAST TECHNIQUE: Contiguous axial images were obtained from the base of the skull through the vertex without intravenous contrast.  COMPARISON:  CT 03/04/2019, MRI 06/14/2018 FINDINGS: Brain: Focal hypodensity within the left frontal white matter, new since 03/04/2019 comparison head CT. No hemorrhage. No mass. Mild atrophy and small vessel ischemic changes of the white matter. Stable ventricle size Vascular: No hyperdense vessels.  Carotid vascular calcification Skull: Normal. Negative for fracture or focal lesion. Sinuses/Orbits: Old fractures of the medial walls of both orbits. Other: None IMPRESSION: 1. Focal hypodensity within the left frontal white matter, suspicious for acute to subacute infarct. Negative for hemorrhage. 2. Mild atrophy and small vessel ischemic changes of the white matter. Electronically Signed   By: Jasmine PangKim  Fujinaga M.D.   On: 04/04/2019 19:27   Ct Abdomen Pelvis W Contrast  Result Date: 04/06/2019 CLINICAL DATA:  Nausea and vomiting EXAM: CT ABDOMEN AND PELVIS WITH CONTRAST TECHNIQUE: Multidetector CT imaging of the abdomen and pelvis was performed using the standard protocol following bolus administration of intravenous contrast. CONTRAST:  100mL OMNIPAQUE IOHEXOL 300 MG/ML  SOLN COMPARISON:  07/25/2018 FINDINGS: Lower chest: Mild cardiomegaly. Coronary atherosclerotic calcification. Hepatobiliary: Early phase imaging is essentially noncontrast. There is mosaic enhancement of the liver on the delayed phase with patchy low-density likely reflecting steatosis. There is borderline surface lobulation and the caudate lobe is large. No focal masslike area. Early hepatic venous reflux usually from elevated right heart pressure. No evidence of biliary obstruction or stone. Pancreas: Unremarkable. Spleen: Unremarkable. Adrenals/Urinary Tract: Negative adrenals. Asymmetric left renal atrophy that has developed since prior. No hydronephrosis or urinary stone. Unremarkable bladder. Stomach/Bowel: No obstruction. No evidence of bowel inflammation. Appendectomy. Vascular/Lymphatic: Extensive atherosclerotic calcification of the  aorta, iliacs, and visceral branches. Bilateral superficial femoral artery occlusion and collapse. no mass or adenopathy. Reproductive:No pathologic findings. Other: No ascites or pneumoperitoneum. Fatty right paramedian umbilical hernia. Musculoskeletal: No acute abnormalities. IMPRESSION: 1. No acute finding. 2. Heterogeneous liver density on the delayed phase, favor either patchy fat infiltration or passive congestion. There are equivocal findings which could reflect cirrhosis, please correlate for risk factors. 3. Left renal atrophy since January 2020, possible interval renal artery compromise. 4. Fatty periumbilical hernia. 5. 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 Electronically Signed   By: Marnee SpringJonathon  Watts M.D.   On: 04/06/2019 11:29   [place] Carotid Bilateral (at [facility] And [facility] Only)  Result Date: 04/06/2019 CLINICAL DATA:  63 year old male with a history of stroke EXAM: BILATERAL CAROTID DUPLEX ULTRASOUND TECHNIQUE: Wallace CullensGray scale imaging, color Doppler and duplex ultrasound were performed of bilateral carotid and vertebral arteries in the neck. COMPARISON:  None. FINDINGS: Criteria: Quantification of carotid stenosis is based on velocity parameters that correlate the residual internal carotid diameter with NASCET-based stenosis levels, using the diameter of the distal internal carotid lumen as the denominator for stenosis measurement. The following velocity measurements were obtained: RIGHT ICA:  Systolic 109 cm/sec, Diastolic 27 cm/sec CCA:  96 cm/sec SYSTOLIC ICA/CCA RATIO:  1.1 ECA:  73 cm/sec LEFT ICA: Occluded CCA:  36 cm/sec SYSTOLIC ICA/CCA RATIO:  Not applicable ECA:  140 cm/sec Right Brachial SBP: Not acquired Left Brachial SBP: Not acquired RIGHT CAROTID ARTERY: No significant calcifications of the right common carotid artery. Intermediate waveform maintained. Heterogeneous  and partially calcified plaque at the right carotid bifurcation. No significant lumen shadowing. Low  resistance waveform of the right ICA. No significant tortuosity. RIGHT VERTEBRAL ARTERY: Antegrade flow with low resistance waveform. LEFT CAROTID ARTERY: No significant calcifications of the left common carotid artery. Intermediate waveform maintained. Heterogeneous and partially calcified plaque at the left carotid bifurcation. Occlusion of the ICA. LEFT VERTEBRAL ARTERY:  Antegrade flow with low resistance waveform. IMPRESSION: Right: Color duplex indicates minimal heterogeneous and calcified plaque, with no hemodynamically significant stenosis by duplex criteria in the extracranial cerebrovascular circulation. Left: Occlusion of the left internal carotid artery. Signed, Yvone Neu. Reyne Dumas, RPVI [facility] Radiology Electronically Signed   By: Gilmer Mor D.O.   On: 04/06/2019 07:18   Dg Chest Port 1 View  Result Date: 04/04/2019 CLINICAL DATA:  Weakness and confusion. EXAM: PORTABLE CHEST 1 VIEW COMPARISON:  Radiographs 04/04/2019 and 03/04/2019. FINDINGS: 1853 hours. There is stable cardiomegaly and aortic atherosclerosis. Chronic interstitial prominence is similar to the study done earlier today and before. There is no superimposed airspace disease, pleural effusion or pneumothorax. The bones appear unchanged. IMPRESSION: Stable examination with cardiomegaly, vascular congestion and chronic interstitial prominence. No new findings. Electronically Signed   By: Carey Bullocks M.D.   On: 04/04/2019 19:23   Dg Chest Portable 1 View  Result Date: 04/04/2019 CLINICAL DATA:  Patient with cough and shortness of breath EXAM: PORTABLE CHEST 1 VIEW COMPARISON:  Chest radiograph 03/04/2019 FINDINGS: Monitoring leads overlie the patient. Stable cardiomegaly. Aortic atherosclerosis. No large area of pulmonary consolidation. Similar mild interstitial opacities. No pleural effusion or pneumothorax. Thoracic spine degenerative changes. IMPRESSION: Cardiomegaly.  Similar mild interstitial opacities may represent mild edema Electronically Signed   By: Annia Belt M.D.   On: 04/04/2019 08:10   Assessment/Plan The patient is a 63 year old male with multiple medical issues (see below) including known carotid artery stenosis who presented  [facility] emergency department complaining of right-sided weakness. 1. Carotid Artery Stenosis: The patient is known to our service.  We have seen him in consult approximately a year ago for right sided weakness and acute CVA.  CTA of the neck on June 13, 2018 was notable for: Severe calcified and noncalcified plaque at the LEFT carotid bifurcation, extending into the proximal ICA. Estimated 80-90% stenosis. In the setting of an acute CVA plan was to move forward with a stent in approximately 2 to 3 weeks.  Unfortunately, the patient did not follow-up in our office/move forward with our recommendations.  Patient's partner was present in the room and notes that the patient has been having what sounds like TIA like symptoms for over the past year.  His partner notes that he continues to smoke tobacco daily and has not followed up with his primary care physician in over a year.  Partner also notes that she has tried to encourage him to seek medical attention for these TIA-like symptoms however he generally refuses.Carotid duplex conducted on 04/05/19: Right: Color duplex indicates minimal heterogeneous and calcified plaque, with no hemodynamically significant stenosis by duplex criteria in the extracranial cerebrovascular circulation. Left:  Occlusion of the left internal carotid artery. Will order a CTA of the neck to confirm occlusion.  If the patient's left internal carotid artery is occluded unfortunately there is no option for repair.  We would continue to monitor the right internal carotid as an outpatient.  2. Tobacco Abuse: We had a discussion for approximately three minutes regarding the absolute need  for smoking cessation due to the deleterious nature of tobacco on the vascular system. We discussed the tobacco use would diminish patency of any intervention, and likely significantly worsen progressio of disease. We discussed multiple agents for quitting including replacement therapy or medications to reduce cravings such as Chantix. The patient voices their understanding of the importance of smoking cessation. 3. Hyperlipidemia: On statin.  Would consider the initiation of aspirin for medical management. Encouraged good control as its slows the progression of atherosclerotic disease. 4.  Diabetes: On appropriate medications. Encouraged good control as its slows the progression of atherosclerotic disease.  Discussed with Dr. Romie Jumper, PA-C  04/07/2019 11:07 AM   Addendum :00 (04/07/19): CTA neck inconclusive. Recommend a diagnostic carotid angiogram.  Procedure, risks and benefit explained to the patient.  All questions answered.  The patient wishes to proceed.  We will plan on this tomorrow afternoon with Dr. Gilda Crease  This note was created with Dragon medical transcription system.  Any error is purely unintentional

## 2019-04-07 NOTE — Progress Notes (Signed)
[employer] - [place] at [facility]     PATIENT NAME: Jonell CluckSamuel Easterwood    MR#:  [id number]  DATE OF BIRTH:  [birth year]  SUBJECTIVE:   Pt. Here due to right sided weakness and mostly had RUE weakness.  Patient has previously refused MRI brain but agrees today.  Await CTA Neck to work up Carotid stenosis.   REVIEW OF SYSTEMS:    Review of Systems  Constitutional: Negative for chills and fever.  HENT: Negative for congestion and tinnitus.   Eyes: Negative for blurred vision and double vision.  Respiratory: Negative for cough, shortness of breath and wheezing.   Cardiovascular: Negative for chest pain, orthopnea and PND.  Gastrointestinal: Negative for abdominal pain, diarrhea, nausea and vomiting.  Genitourinary: Negative for dysuria and hematuria.  Neurological: Positive for weakness (Right Upper Ext.  ). Negative for dizziness, sensory change and focal weakness.  All other systems reviewed and are negative.   Nutrition: Dysphagia III Tolerating Diet: Yes Tolerating PT: Eval noted.    DRUG ALLERGIES:   Allergies  Allergen Reactions   Aspirin Other (See Comments)    Told not to take because of blood thinner   Entresto [Sacubitril-Valsartan] Other (See Comments)    Dizziness, weakness and stuttering   Ibuprofen Nausea And Vomiting   Tylenol [Acetaminophen] Nausea And Vomiting    VITALS:  Blood pressure (!) 134/100, pulse (!) 121, temperature 97.6 F (36.4 C), resp. rate 20, height 5\' 11"  (1.803 m), weight 99.8 kg, SpO2 100 %.  PHYSICAL EXAMINATION:   Physical Exam  GENERAL:  63 y.o.-year-old patient lying in bed in no acute distress.  EYES: Pupils equal, round, reactive to light and accommodation. No scleral icterus. Extraocular muscles intact.  HEENT: Head atraumatic, normocephalic. Oropharynx and nasopharynx clear.  NECK:  Supple, no jugular venous distention. No thyroid enlargement, no tenderness.  LUNGS: Normal breath sounds bilaterally, no  wheezing, rales, rhonchi. No use of accessory muscles of respiration.  CARDIOVASCULAR: S1, S2 normal. No murmurs, rubs, or gallops.  ABDOMEN: Soft, nontender, nondistended. Bowel sounds present. No organomegaly or mass.  EXTREMITIES: No cyanosis, clubbing or edema b/l.    NEUROLOGIC: Cranial nerves II through XII are intact. Right upper Ext. Weakness 3/5 Strength.   PSYCHIATRIC: The patient is alert and oriented x 3.  SKIN: No obvious rash, lesion, or ulcer.    LABORATORY PANEL:   CBC Recent Labs  Lab 04/07/19 0638  WBC 6.1  HGB 12.2*  HCT 38.3*  PLT 117*   ------------------------------------------------------------------------------------------------------------------  Chemistries  Recent Labs  Lab 04/04/19 2041  04/07/19 0638  NA 135   < > 138  K 3.6   < > 4.0  CL 99   < > 104  CO2 24   < > 23  GLUCOSE 116*   < > 127*  BUN 26*   < > 31*  CREATININE 1.45*   < > 1.59*  CALCIUM 9.8   < > 9.2  AST 30  --   --   ALT 35  --   --   ALKPHOS 209*  --   --   BILITOT 1.8*  --   --    < > = values in this interval not displayed.   ------------------------------------------------------------------------------------------------------------------  Cardiac Enzymes No results for input(s): TROPONINI in the last 168 hours. ------------------------------------------------------------------------------------------------------------------  RADIOLOGY:  Ct Head Wo Contrast  Result Date: 04/06/2019 CLINICAL DATA:  Subacute neuro deficits. Suspected stroke. History of atrial fibrillation, heart failure, diabetes and hypertension. EXAM: CT  HEAD WITHOUT CONTRAST TECHNIQUE: Contiguous axial images were obtained from the base of the skull through the vertex without intravenous contrast. COMPARISON:  CT head 04/04/2019 and 03/04/2019. FINDINGS: Brain: There is no evidence of acute intracranial hemorrhage, mass lesion, brain edema or extra-axial fluid collection. The ventricles and subarachnoid  spaces are appropriately sized for age. The ill-defined low-density in the left frontal periventricular white matter is not significantly changed over the last 36 hours. No cortical abnormality identified. Vascular: Intracranial vascular calcifications. No hyperdense vessel identified. Skull: Negative for fracture or focal lesion. Sinuses/Orbits: The visualized paranasal sinuses and mastoid air cells are clear. No orbital abnormalities are seen. Other: Stable deformities of the medial walls of both orbits. IMPRESSION: 1. Unchanged appearance of probable subacute nonhemorrhagic stroke in the left frontal periventricular white matter compared with prior study of 04/04/2019. 2. No evidence of acute intracranial hemorrhage or mass effect. Electronically Signed   By: Richardean Sale M.D.   On: 04/06/2019 09:34   Ct Abdomen Pelvis W Contrast  Result Date: 04/06/2019 CLINICAL DATA:  Nausea and vomiting EXAM: CT ABDOMEN AND PELVIS WITH CONTRAST TECHNIQUE: Multidetector CT imaging of the abdomen and pelvis was performed using the standard protocol following bolus administration of intravenous contrast. CONTRAST:  154mL OMNIPAQUE IOHEXOL 300 MG/ML  SOLN COMPARISON:  07/25/2018 FINDINGS: Lower chest: Mild cardiomegaly. Coronary atherosclerotic calcification. Hepatobiliary: Early phase imaging is essentially noncontrast. There is mosaic enhancement of the liver on the delayed phase with patchy low-density likely reflecting steatosis. There is borderline surface lobulation and the caudate lobe is large. No focal masslike area. Early hepatic venous reflux usually from elevated right heart pressure. No evidence of biliary obstruction or stone. Pancreas: Unremarkable. Spleen: Unremarkable. Adrenals/Urinary Tract: Negative adrenals. Asymmetric left renal atrophy that has developed since prior. No hydronephrosis or urinary stone. Unremarkable bladder. Stomach/Bowel: No obstruction. No evidence of bowel inflammation. Appendectomy.  Vascular/Lymphatic: Extensive atherosclerotic calcification of the aorta, iliacs, and visceral branches. Bilateral superficial femoral artery occlusion and collapse. no mass or adenopathy. Reproductive:No pathologic findings. Other: No ascites or pneumoperitoneum. Fatty right paramedian umbilical hernia. Musculoskeletal: No acute abnormalities. IMPRESSION: 1. No acute finding. 2. Heterogeneous liver density on the delayed phase, favor either patchy fat infiltration or passive congestion. There are equivocal findings which could reflect cirrhosis, please correlate for risk factors. 3. Left renal atrophy since January 2020, possible interval renal artery compromise. 4. Fatty periumbilical hernia. 5. 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 Electronically Signed   By: Monte Fantasia M.D.   On: 04/06/2019 11:29   US Carotid Bilateral (at [facility] And [facility] Only)  Result Date: 04/06/2019 CLINICAL DATA:  63 year old male with a history of stroke EXAM: BILATERAL CAROTID DUPLEX ULTRASOUND TECHNIQUE: Pearline Cables scale imaging, color Doppler and duplex ultrasound were performed of bilateral carotid and vertebral arteries in the neck. COMPARISON:  None. FINDINGS: Criteria: Quantification of carotid stenosis is based on velocity parameters that correlate the residual internal carotid diameter with NASCET-based stenosis levels, using the diameter of the distal internal carotid lumen as the denominator for stenosis measurement. The following velocity measurements were obtained: RIGHT ICA:  Systolic 409 cm/sec, Diastolic 27 cm/sec CCA:  96 cm/sec SYSTOLIC ICA/CCA RATIO:  1.1 ECA:  73 cm/sec LEFT ICA: Occluded CCA:  36 cm/sec SYSTOLIC ICA/CCA RATIO:  Not applicable ECA:  811 cm/sec Right Brachial SBP: Not acquired Left Brachial SBP: Not acquired RIGHT CAROTID ARTERY: No significant calcifications of the right common carotid artery. Intermediate waveform maintained. Heterogeneous and partially calcified plaque at the  right carotid bifurcation. No significant lumen shadowing. Low resistance waveform of the right ICA. No significant tortuosity. RIGHT VERTEBRAL ARTERY: Antegrade flow with low resistance waveform. LEFT CAROTID ARTERY: No significant calcifications of the left common carotid artery. Intermediate waveform maintained. Heterogeneous and partially calcified plaque at the left carotid bifurcation. Occlusion of the ICA. LEFT VERTEBRAL ARTERY:  Antegrade flow with low resistance waveform. IMPRESSION: Right: Color duplex indicates minimal heterogeneous and calcified plaque, with no hemodynamically significant stenosis by duplex criteria in the extracranial cerebrovascular circulation. Left: Occlusion of the left internal carotid artery. Signed, Yvone Neu. Reyne Dumas, RPVI [facility] Electronically Signed   By: Gilmer Mor D.O.   On: 04/06/2019 07:18     ASSESSMENT AND PLAN:   63 year old male with past medical history of atrial fibrillation, ischemic cardiomyopathy, hypertension, hyperlipidemia, history of gout, diabetes, COPD, chronic combined systolic diastolic CHF, carotid artery disease who presented to the hospital due to right upper extremity weakness and noted to have an acute CVA.  1.  Acute/subacute CVA-this was noted on the CT scan of the head on admission. -Patient was refusing MRI yesterday but has agreed to it today. - Continue Xarelto, patient has previous history of carotid artery stenosis and is refused treatment in the past. - Await CTA of the neck to rule out worsening left carotid stenosis and if completely occluded as per vascular surgery no further intervention and continue medical management. -Continue atorvastatin, PT OT as tolerated.  2.  Carotid artery stenosis-patient has previous history of left-sided carotid stenosis and has been noncompliant with intervention as per vascular surgery. -CTA neck obtained today which is  inconclusive.  Discussed with vascular surgery and they will talk to patient about possible carotid angiogram. -Continue Xarelto, atorvastatin for now.  3. Essential Hypertension - cont. Coreg, Aldactone  4. Hx of chronic Systolic CHF - clinically not in CHF.  - hold Lasix for now.  Cont. Aldactone, Coreg.    5. Hyperlipidemia - cont. Atorvastatin  6. Hx of COPD - no acute exacerbation.  - cont. Symbicort, duonebs as needed, Spiriva.   7.  Diabetes type 2 with hyperglycemia-patient's A1c was as high as 8. - Continue sliding scale insulin, will add low dose Lantus.   - will get DM Coordinator consult.      All the records are reviewed and case discussed with Care Management/Social Worker. Management plans discussed with the patient, family and they are in agreement.  CODE STATUS: Full code  DVT Prophylaxis: Xarelto  TOTAL TIME TAKING CARE OF THIS PATIENT: 35 minutes.   POSSIBLE D/C IN 1-2 DAYS, DEPENDING ON CLINICAL CONDITION.   Houston Siren M.D on 04/07/2019 at 12:41 PM  Between 7am to 6pm - Pager - [phone]  After 6pm go to [email or web address] - Therapist, nutritional Hospitalists  Office  [phone]  CC: Primary care physician; Center, [facility]

## 2019-04-07 NOTE — Care Management Important Message (Signed)
Important Message  Patient Details  Name: [name] MRN: [id number] Date of Birth: 11-11-1955   Medicare Important Message Given:  Yes     Juliann Pulse A Ashe Gago 04/07/2019, 11:18 AM

## 2019-04-07 NOTE — Progress Notes (Signed)
Patients blood sugar was 460 more than what the sliding scale calls for and stated to call MD. Also patients HR is running in the low 120's. Messaged Dr. Verdell Carmine, new orders for Novolog 14 units one time dose and 500 cc bolus NS.

## 2019-04-07 NOTE — Progress Notes (Signed)
Physical Therapy Treatment Patient Details Name: [name] MRN: [id number] DOB: Nov 21, 1955 Today's Date: 04/07/2019    History of Present Illness 63 yo male presented to ED 10/5 with dysarthria and mild R sided weakness, pt treated earlier in the day for [facility] and CHF, CT scan shows left frontal infarct. PMH includes CAD, CKD, COPD, DM, HLD, HTN, persistent Afib, stroke 05/2018    PT Comments    Pt in chair upon arrival with family present and agreed to try some exercises. Pt pleasant throughout session with decreased agitation as compared to yesterday but very lethargic seeming to fall asleep during exercises. Pt reports 6/10 all over and that his [facility] is not any better from yesterday. Pt BP assessed and stable in sitting, SpO2 97% and HR 121 at rest. Nursing present during vitals and reported that his HR has been elevated all morning and to limit therapy to seated therex today. Pt also scheduled for MRI this afternoon. Pt performed seated LE therex requiring min cuing for correct performance but max encouragement to participate and perform exercises. HR monitored throughout seated therex with HR staying 120-125 during activity and SpO2 92-97%. Pt continues to complain of worsening weakness. Pt with minimal contraction and participation with RUE stating he cannot use it. Grip strength 2-/5 with R hand. Pt would continue to benefit from skilled PT to improve deficits.    Follow Up Recommendations  Home health PT     Equipment Recommendations  None recommended by PT    Recommendations for Other Services       Precautions / Restrictions Precautions Precautions: Fall Restrictions Weight Bearing Restrictions: No    Mobility  Bed Mobility Overal bed mobility: Modified Independent             General bed mobility comments: pt requires increased time and effort but able to independently scoot and reposition himself in bed  Transfers Overall transfer level: Needs  assistance Equipment used: None Transfers: Sit to/from Stand Sit to Stand: Min guard         General transfer comment: very close guard today for STS from recliner and ambulation to bed, able to rise without physical assist, pt stated he felt like he was going to fall although no unsteadiness noted at the time  Ambulation/Gait             General Gait Details: deferred today secondary to pt lethargic and fatigued and elevated HR at rest   Stairs             Wheelchair Mobility    Modified Rankin (Stroke Patients Only) Modified Rankin (Stroke Patients Only) Pre-Morbid Rankin Score: No symptoms Modified Rankin: Moderate disability     Balance Overall balance assessment: Needs assistance;History of Falls Sitting-balance support: Feet supported Sitting balance-Leahy Scale: Good Sitting balance - Comments: steady sitting EOB     Standing balance-Leahy Scale: Fair Standing balance comment: no LOB without UE support however pt trying to reach out for furniture                            Cognition Arousal/Alertness: Lethargic Behavior During Therapy: [facility] for tasks assessed/performed Overall Cognitive Status: No family/caregiver present to determine baseline cognitive functioning                                        Exercises Total Joint Exercises  Ankle Circles/Pumps: AROM;Both;10 reps Long Arc Quad: AROM;Both;10 reps Marching in Standing: AROM;Both;10 reps;Seated General Exercises - Upper Extremity Elbow Flexion: AAROM;Right;10 reps Digit Composite Flexion: AROM;Right;5 reps(squeezing therapist hand, very light squeeze) General Exercises - Lower Extremity Toe Raises: AROM;Both;10 reps;Seated Heel Raises: AROM;Both;10 reps;Seated    General Comments        Pertinent Vitals/Pain Pain Score: 6  Pain Location: all over Pain Descriptors / Indicators: Aching;Sore Pain Intervention(s): Limited activity within patient's  tolerance;Monitored during session;Repositioned    Home Living                      Prior Function            PT Goals (current goals can now be found in the care plan section) Progress towards PT goals: Progressing toward goals    Frequency    7X/week      PT Plan Current plan remains appropriate    Co-evaluation              AM-PAC PT "6 Clicks" Mobility   Outcome Measure  Help needed turning from your back to your side while in a flat bed without using bedrails?: None Help needed moving from lying on your back to sitting on the side of a flat bed without using bedrails?: None Help needed moving to and from a bed to a chair (including a wheelchair)?: A Little Help needed standing up from a chair using your arms (e.g., wheelchair or bedside chair)?: A Little Help needed to walk in hospital room?: A Little Help needed climbing 3-5 steps with a railing? : A Little 6 Click Score: 20    End of Session Equipment Utilized During Treatment: Gait belt Activity Tolerance: Patient limited by fatigue;Patient limited by lethargy Patient left: in bed;with call bell/phone within reach Nurse Communication: Mobility status PT Visit Diagnosis: Difficulty in walking, not elsewhere classified (R26.2);Muscle weakness (generalized) (M62.81);Other symptoms and signs involving the nervous system (G95.621)     Time: 3086-5784 PT Time Calculation (min) (ACUTE ONLY): 19 min  Charges:  $Therapeutic Exercise: 8-22 mins                     Aritzel Krusemark PT, DPT 12:58 PM,04/07/19 [phone]

## 2019-04-07 NOTE — Progress Notes (Addendum)
Patients HR has been running in the low 120s all day. Made MD aware earlier, and had order to bolus 560ml NS, no change.  Later sent another message to Dr. Verdell Carmine, he said he was going to order some low dose cardizem for his HR.

## 2019-04-08 ENCOUNTER — Encounter
Admission: EM | Disposition: A | Discharge status: Hospice | Payer: Self-pay | Source: Home / Self Care | Attending: Internal Medicine

## 2019-04-08 ENCOUNTER — Inpatient Hospital Stay: Payer: No Typology Code available for payment source

## 2019-04-08 DIAGNOSIS — I6522 Occlusion and stenosis of left carotid artery: Secondary | ICD-10-CM

## 2019-04-08 HISTORY — PX: CAROTID ANGIOGRAPHY: CATH118230

## 2019-04-08 LAB — BASIC METABOLIC PANEL
Anion gap: 9 (ref 5–15)
BUN: 37 mg/dL — ABNORMAL HIGH (ref 8–23)
CO2: 24 mmol/L (ref 22–32)
Calcium: 9.1 mg/dL (ref 8.9–10.3)
Chloride: 104 mmol/L (ref 98–111)
Creatinine, Ser: 1.61 mg/dL — ABNORMAL HIGH (ref 0.61–1.24)
GFR calc Af Amer: 52 mL/min — ABNORMAL LOW (ref >60)
GFR calc non Af Amer: 45 mL/min — ABNORMAL LOW (ref >60)
Glucose, Bld: 135 mg/dL — ABNORMAL HIGH (ref 70–99)
Potassium: 4.3 mmol/L (ref 3.5–5.1)
Sodium: 137 mmol/L (ref 135–145)

## 2019-04-08 LAB — CBC
HCT: 36.6 % — ABNORMAL LOW (ref 39.0–52.0)
Hemoglobin: 11.5 g/dL — ABNORMAL LOW (ref 13.0–17.0)
MCH: 29 pg (ref 26.0–34.0)
MCHC: 31.4 g/dL (ref 30.0–36.0)
MCV: 92.2 fL (ref 80.0–100.0)
Platelets: 106 10*3/uL — ABNORMAL LOW (ref 150–400)
RBC: 3.97 MIL/uL — ABNORMAL LOW (ref 4.22–5.81)
RDW: 19.7 % — ABNORMAL HIGH (ref 11.5–15.5)
WBC: 6.1 10*3/uL (ref 4.0–10.5)
nRBC: 1.3 % — ABNORMAL HIGH (ref 0.0–0.2)

## 2019-04-08 LAB — GLUCOSE, CAPILLARY
Glucose-Capillary: 123 mg/dL — ABNORMAL HIGH (ref 70–99)
Glucose-Capillary: 124 mg/dL — ABNORMAL HIGH (ref 70–99)
Glucose-Capillary: 205 mg/dL — ABNORMAL HIGH (ref 70–99)
Glucose-Capillary: 242 mg/dL — ABNORMAL HIGH (ref 70–99)

## 2019-04-08 SURGERY — CAROTID ANGIOGRAPHY
Anesthesia: Moderate Sedation | Laterality: Bilateral

## 2019-04-08 MED ORDER — MIDAZOLAM HCL 2 MG/2ML IJ SOLN
INTRAMUSCULAR | Status: AC
  Filled 2019-04-08: qty 2

## 2019-04-08 MED ORDER — SODIUM CHLORIDE 0.9 % IV SOLN: INTRAVENOUS | Status: DC

## 2019-04-08 MED ORDER — METHYLPREDNISOLONE SODIUM SUCC 125 MG IJ SOLR: 125.0000 mg | Freq: Once | INTRAMUSCULAR | Status: DC | PRN

## 2019-04-08 MED ORDER — FAMOTIDINE 20 MG PO TABS: 40.0000 mg | ORAL_TABLET | Freq: Once | ORAL | Status: DC | PRN

## 2019-04-08 MED ORDER — HEPARIN SODIUM (PORCINE) 1000 UNIT/ML IJ SOLN
INTRAMUSCULAR | Status: AC
  Filled 2019-04-08: qty 1

## 2019-04-08 MED ORDER — DIPHENHYDRAMINE HCL 50 MG/ML IJ SOLN: 50.0000 mg | Freq: Once | INTRAMUSCULAR | Status: DC | PRN

## 2019-04-08 MED ORDER — HEPARIN SODIUM (PORCINE) 1000 UNIT/ML IJ SOLN
INTRAMUSCULAR | Status: DC | PRN
  Administered 2019-04-08: 3000 [IU] via INTRAVENOUS

## 2019-04-08 MED ORDER — ONDANSETRON HCL 4 MG/2ML IJ SOLN: 4.0000 mg | Freq: Four times a day (QID) | INTRAMUSCULAR | Status: DC | PRN

## 2019-04-08 MED ORDER — FENTANYL CITRATE (PF) 100 MCG/2ML IJ SOLN
INTRAMUSCULAR | Status: AC
  Filled 2019-04-08: qty 2

## 2019-04-08 MED ORDER — FENTANYL CITRATE (PF) 100 MCG/2ML IJ SOLN
INTRAMUSCULAR | Status: DC | PRN
  Administered 2019-04-08: 25 ug via INTRAVENOUS

## 2019-04-08 MED ORDER — HYDROMORPHONE HCL 1 MG/ML IJ SOLN: 1.0000 mg | Freq: Once | INTRAMUSCULAR | Status: DC | PRN

## 2019-04-08 MED ORDER — MIDAZOLAM HCL 2 MG/2ML IJ SOLN
INTRAMUSCULAR | Status: DC | PRN
  Administered 2019-04-08: 1 mg via INTRAVENOUS

## 2019-04-08 MED ORDER — LEVETIRACETAM IN NACL 500 MG/100ML IV SOLN
500.0000 mg | Freq: Two times a day (BID) | INTRAVENOUS | Status: DC
  Administered 2019-04-08 – 2019-04-11 (×6): 500 mg via INTRAVENOUS
  Filled 2019-04-08 (×8): qty 100

## 2019-04-08 MED ORDER — MIDAZOLAM HCL 2 MG/ML PO SYRP: 8.0000 mg | ORAL_SOLUTION | Freq: Once | ORAL | Status: DC | PRN

## 2019-04-08 MED ORDER — CEFAZOLIN SODIUM-DEXTROSE 2-4 GM/100ML-% IV SOLN
2.0000 g | Freq: Once | INTRAVENOUS | Status: DC
  Filled 2019-04-08: qty 100

## 2019-04-08 SURGICAL SUPPLY — 12 items
CATH ANGIO 5F 100CM .035 PIG (CATHETERS) ×3 IMPLANT
CATH BEACON 5 .035 100 H1 TIP (CATHETERS) ×3 IMPLANT
DEVICE STARCLOSE SE CLOSURE (Vascular Products) ×3 IMPLANT
DEVICE TORQUE .025-.038 (MISCELLANEOUS) ×3 IMPLANT
GLIDEWIRE ANGLED SS 035X260CM (WIRE) ×3 IMPLANT
NEEDLE ENTRY 21GA 7CM ECHOTIP (NEEDLE) ×3 IMPLANT
PACK ANGIOGRAPHY (CUSTOM PROCEDURE TRAY) ×3 IMPLANT
SET INTRO CAPELLA COAXIAL (SET/KITS/TRAYS/PACK) ×3 IMPLANT
SHEATH BRITE TIP 5FRX11 (SHEATH) ×3 IMPLANT
SYR MEDRAD MARK 7 150ML (SYRINGE) ×3 IMPLANT
TUBING CONTRAST HIGH PRESS 72 (TUBING) ×3 IMPLANT
WIRE J 3MM .035X145CM (WIRE) ×3 IMPLANT

## 2019-04-08 NOTE — Progress Notes (Signed)
10/09 - Patient states that he had an episode of urinary incontinence  - MRI 10/08: Multiple acute infarcts within the left cerebral hemisphere involving the cortex as well as subcortical and deep periventricular white matter. These infarcts are predominantly within the MCA/ACA and MCA/PCA watershed territories. Additional punctate acute infarct within the anterior left lentiform nucleus.Small acute infarcts also present within the bilateral occipital lobes within the PCA vascular territories.Moderate chronic small vessel ischemic disease, progressed as compared to prior MRI 06/14/2018.  - CTA 10/08: Prominent atherosclerotic disease within the visualized aortic arch and major branch vessels of the neck, most notably as follows.Severe soft and calcified plaque within the left carotid bifurcation and proximal ICA. Near complete occlusion versuscomplete occlusion with immediate reconstitution within the proxima lleft ICA. New from prior CTA in 2019, the mid to distal cervical left ICA is diminutive in caliber with asymmetrically decreased enhancement. Diminished and irregular opacification is also seen within the visualized left carotid artery siphon. Correlate with findings on carotid artery duplex performed 04/05/2019.Estimated 60% stenosis of the proximal right ICA. There is also an estimated 50% stenosis within the distal right common carotid artery.Heavily calcified right internal carotid artery siphon with regions of at least moderate stenosis in the cavernous right ICA.Soft and calcified plaque within the bilateral vertebral arteries. Moderate focal stenosis within the V1 left vertebral artery. Additional sites of mild/moderate stenosis within the bilateral cervical vertebral arteries. Calcified plaque within the non-dominant intracranial left vertebral artery with sites of moderate/severe stenosis.  Neuro exam remains stable. Patient resting comfortably in bed, stating that he is having a  procedure today (Per CV notes, he is a having diagnostic cerebral angio) C/o right hand weakness  Neurologic Examination: Alert, awake, following commands, slight dysrathria PERLA, EOMI, no nystagmus, VFF, face seems symmetrical, face sensation tot touch seems symmetrical, uvukla and tongue midline He is 4/5 in all extremities. He is 1/5 in right wrist flexion/extension No sensory deficit appreciated. Difficult to assess No coordination deficit appreciated DTR not checked at time of examination Gait: not checked   RECS: - Continue neuro protectives measures including normothermia, normoglycemia, correct electrolytes/metabolic abnlites, treat any infection while admitted - Would not control patient BP very tightly given his atherosclerosis to allow for some brain perfusion - Appreciate CV recs and input. - Continue Xarelto - PT/OT     10/07: No major neuro event overnight Patient had  Ahead ct in AM: when I interviewed the patient, patient was complaining of SOB, denies any new weakness or speech disturbances. Head ct: Unchanged appearance of probable subacute nonhemorrhagic stroke in the left frontal periventricular white matter compared with prior study of 04/04/2019. No evidence of acute intracranial hemorrhage or mass effect.  Patient  refused MRI  Neuro exam remains unchanged and stable Neurologic Examination: Alert, awake, following commands, slight dysrathria PERLA, EOMI, no nystagmus, VFF, face seems symmetrical, face sensation tot touch seems symmetrical, uvukla and tongue midline He is 4/5 in all extremities No sensory deficit appreciated No coordination deficit appreciated DTR not checked at time of examination Gait: Patient. Per pt, patient ambulating from bathroom to his bed  RECS: - Continue neuro protectives measures including normothermia, normoglycemia, correct electrolytes/metabolic abnlites, treat any infection - Would not control patient BP very tightly  given his atherosclerosis to allow for some brain perfusion - Consider CV consult for optimization of his treatment regarding carotid occlusion if not candidate for surgery - Continue Xarelto - PT/OT - patient agreeble for MRI. Will f/up   10/06: initial consult  Chief Complaint:  Left frontal stroke HPI: [name] is an 63 y.o. male with history of CAD, CHF, HTN, HLD, atrial fibrillation on Xarelto, CVA admitted with right sided weakness and slurred speech.  Patient is a limited historian.  Per  Note, patient seen in this ED earlier on day of admission  for SOB, found to have acute on chronic exacerbation of his heart failure. Received IV Lasix and discharged with plan to follow up in heart failure clinic. After leaving the emergency department he developed some slurred speech and right-sided weakness.  His home health nurse checked on him and was concerned he may be experiencing a stroke, which prompted him to return to the emergency department. Patient states his symptoms started around 1 PM today.  He is on anticoagulation for his history of atrial fibrillation.  He reports a history of prior strokes with similar symptoms.  He denies any recent illnesses, no vomiting, diarrhea, urinary symptoms Head ct obtained in ER showing: Focal hypodensity within the left frontal white matter,suspicious for acute to subacute infarct. Negative for hemorrhage.Mild atrophy and small vessel ischemic changes of the white matter. Reviewing his chart, patient had an ct angio of neck and head on 05/2018 that showed Severe calcified and noncalcified plaque at the LEFT carotid bifurcation, extending into the proximal ICA. Estimated 80-90% stenosis.Non stenotic atheromatous change RIGHT carotid bifurcation, estimated 60% stenosis.no intracranial flow-limiting stenosis or large vessel occlusion.Significant calcified plaque both distal cavernous carotid arteries, estimated 50-75% stenosis bilaterally.Poor dentition,  with numerous missing teeth, dental caries, andperiapical lucencies. Echo on 05/2018 with EF 30-35% with diffuse hypokinesis. Pateitn admitted for further management This AM, patient working with PT, standing and has walked around. States that he is sob  Past Medical History:  Diagnosis Date  . CAD in native artery    a. stress echo 12/2007 abnl, EF > 55%, b. LHC 01/28/08: mLAD 30, D1 40, dLCx 70, pRCA 30, mRCA 70, mRCA lesion 2 80, PDA 90, s/p PCI/BMS to prox and distal RCA, s/p PCI/BMS to PDA; c. patient reports PCI/stenting x 2 in early 2017 at the [place] (no records on file) d. 06/2016: cath showing patent stents along RCA and LCx with moderate 40% stenosis along the LAD.   [name] Carotid arterial disease (HCC)    a. 05/2018 CTA Head/neck: 80-90% stenosis @ L carotid bifurcation, extending into the prox LICA. 60% stenosis @ R carotid bifurcation. 50-75% bilateral distal cavernous carotid dzs.  . Chronic combined systolic (congestive) and diastolic (congestive) heart failure (HCC)    a. 2009 Echo: > 55%; b. 06/2016: EF 25-30%; c. 12/2017 Echo: EF 30-35%, diff HK; d. 05/2018 Echo: 30-35%, mild conc LVH, diff HK. Mild MR. Mildly dil LA/RA.  [name] Chronic kidney disease   . COPD (chronic obstructive pulmonary disease) (HCC)   . Diabetes mellitus without complication (HCC)   . Gout   . Hyperlipidemia   . Hypertension   . Hypertensive heart disease   . Ischemic cardiomyopathy    a. 05/2018: 30-35%.  . Obesity   . Persistent atrial fibrillation (HCC) 01/2017   a. cardioverted 9/18 to NSR; b. 12/2017 noted to be back in Afib-outpt dccv rec; c. CHA2DS2VASc = 6-->Xarelto.  . Stroke (cerebrum) (HCC)    a. 05/2018 MRI: small patchy acute to subacute cortial and white matter infarct in the bilat cerebrum. Mild chronic small vessel ischmia; b. 05/2018 Carotid U/S: L-carotid bifurcation 80-90% stenosed, R- carotid bifurcation 60% stenosed.  . Tobacco abuse     Past  Surgical History:  Procedure Laterality Date  .  CARDIAC CATHETERIZATION  2009   [facility];   . CARDIAC CATHETERIZATION  2010  . CARDIAC CATHETERIZATION N/A 07/28/2016   Procedure: Right and Left Heart Cath and possible PCI;  Surgeon: Iran OuchMuhammad A Arida, MD;  Location: [facility] INVASIVE CV LAB;  Service: Cardiovascular;  Laterality: N/A;  . CARDIOVERSION N/A 03/27/2017   Procedure: CARDIOVERSION;  Surgeon: Iran OuchArida, Muhammad A, MD;  Location: [facility] ORS;  Service: Cardiovascular;  Laterality: N/A;  . CARDIOVERSION N/A 07/30/2018   Procedure: CARDIOVERSION;  Surgeon: Antonieta IbaGollan, Timothy J, MD;  Location: [facility] ORS;  Service: Cardiovascular;  Laterality: N/A;  . CORONARY ANGIOPLASTY  2009   [facility]    Family History  Problem Relation Age of Onset  . Heart attack Mother [id number]  . Hypertension Mother   . Cancer Brother    Social History:  reports that he has quit smoking. His smoking use included cigarettes. He has a 35.00 pack-year smoking history. He has never used smokeless tobacco. He reports previous alcohol use. He reports current drug use.  Allergies:  Allergies  Allergen Reactions  . Aspirin Other (See Comments)    Told not to take because of blood thinner  . Entresto [Sacubitril-Valsartan] Other (See Comments)    Dizziness, weakness and stuttering  . Ibuprofen Nausea And Vomiting  . Tylenol [Acetaminophen] Nausea And Vomiting    Medications Prior to Admission  Medication Sig Dispense Refill  . atorvastatin (LIPITOR) 80 MG tablet Take 80 mg by mouth daily.    . budesonide-formoterol (SYMBICORT) 160-4.5 MCG/ACT inhaler Inhale 2 puffs into the lungs 2 (two) times daily.    . carvedilol (COREG) 6.25 MG tablet Take 1 tablet (6.25 mg total) by mouth 2 (two) times daily with a meal. 60 tablet 1  . magnesium oxide (MAG-OX) 400 MG tablet Take 400 mg by mouth 2 (two) times daily.    . methocarbamol (ROBAXIN) 500 MG tablet Take 500 mg by mouth 4 (four) times daily.    . nitroGLYCERIN (NITROSTAT) 0.4 MG SL tablet Place 0.4 mg under the  tongue every 5 (five) minutes as needed for chest pain.    [name]. oxyCODONE (OXY IR/ROXICODONE) 5 MG immediate release tablet Take 5 mg by mouth 2 (two) times a day.    . rivaroxaban (XARELTO) 20 MG TABS tablet Take 20 mg by mouth daily with supper.    . sennosides-docusate sodium (SENOKOT-S) 8.6-50 MG tablet Take 2 tablets by mouth daily.    [name]. spironolactone (ALDACTONE) 25 MG tablet Take 1 tablet (25 mg total) by mouth daily. 90 tablet 3  . Tiotropium Bromide Monohydrate 2.5 MCG/ACT AERS Inhale 2 puffs into the lungs daily.    . traZODone (DESYREL) 50 MG tablet Take 50 mg by mouth at bedtime.    . docusate sodium (COLACE) 100 MG capsule Take 1 capsule (100 mg total) by mouth 2 (two) times daily. (Patient not taking: Reported on 04/05/2019) 60 capsule 6  . rivaroxaban (XARELTO) 15 MG TABS tablet Take 1 tablet (15 mg total) by mouth daily with supper. (Patient not taking: Reported on 04/05/2019) 30 tablet 1    ROS: As pr HPI  Physical Examination: Blood pressure 123/87, pulse (!) 118, temperature 97.8 F (36.6 C), temperature source Oral, resp. rate 20, height 5\' 11"  (1.803 m), weight 99.8 kg, SpO2 94 %.  HEENT-  Normocephalic, no lesions, without obvious abnormality.  Normal external eye and conjunctiva.  Normal TM's bilaterally.  Normal auditory canals and external  ears. Normal external nose, mucus membranes and septum.  Normal pharynx. Neck supple with no masses, nodes, nodules or enlargement. Cardiovascular - regular rate and rhythm, S1, S2 normal, no murmur, click, rub or gallop Lungs - chest clear, no wheezing, rales, normal symmetric air entry, Heart exam - S1, S2 normal, no murmur, no gallop, rate regular Abdomen - soft, non-tender; bowel sounds normal; no masses,  no organomegaly Extremities - 1+ edema in LEX  Neurologic Examination: Alert, awake, following commands, slight dysrathria PERLA, EOMI, no nystagmus, VFF, face seems symmetrical, face sensation tot touch seems symmetrical,  uvukla and tongue midline He is 4/5 in all extremities No sensory deficit appreciated No coordination deficit appreciated DTR not checked at time of examination Gait: Patient walked around with PT   Results for orders placed or performed during the hospital encounter of 04/04/19 (from the past 48 hour(s))  Glucose, capillary     Status: Abnormal   Collection Time: 04/06/19 12:10 PM  Result Value Ref Range   Glucose-Capillary 201 (H) 70 - 99 mg/dL  Troponin I (High Sensitivity)     Status: Abnormal   Collection Time: 04/06/19  4:55 PM  Result Value Ref Range   Troponin I (High Sensitivity) 56 (H) <18 ng/L    Comment: (NOTE) Elevated high sensitivity troponin I (hsTnI) values and significant  changes across serial measurements may suggest ACS but many other  chronic and acute conditions are known to elevate hsTnI results.  Refer to the "Links" section for chest pain algorithms and additional  guidance. Performed at [facility], [place], [place] [address]   Glucose, capillary     Status: Abnormal   Collection Time: 04/06/19  5:28 PM  Result Value Ref Range   Glucose-Capillary 135 (H) 70 - 99 mg/dL  Troponin I (High Sensitivity)     Status: Abnormal   Collection Time: 04/06/19  7:28 PM  Result Value Ref Range   Troponin I (High Sensitivity) 60 (H) <18 ng/L    Comment: (NOTE) Elevated high sensitivity troponin I (hsTnI) values and significant  changes across serial measurements may suggest ACS but many other  chronic and acute conditions are known to elevate hsTnI results.  Refer to the "Links" section for chest pain algorithms and additional  guidance. Performed at [facility], [address] [place], [place] [address]   Glucose, capillary     Status: Abnormal   Collection Time: 04/06/19  9:14 PM  Result Value Ref Range   Glucose-Capillary 158 (H) 70 - 99 mg/dL  CBC     Status: Abnormal   Collection Time: 04/07/19  6:38 AM  Result  Value Ref Range   WBC 6.1 4.0 - 10.5 K/uL   RBC 4.21 (L) 4.22 - 5.81 MIL/uL   Hemoglobin 12.2 (L) 13.0 - 17.0 g/dL   HCT 38.3 (L) 39.0 - 52.0 %   MCV 91.0 80.0 - 100.0 fL   MCH 29.0 26.0 - 34.0 pg   MCHC 31.9 30.0 - 36.0 g/dL   RDW 19.8 (H) 11.5 - 15.5 %   Platelets 117 (L) 150 - 400 K/uL   nRBC 1.2 (H) 0.0 - 0.2 %    Comment: Performed at [facility], [address], [place], [place] [address]  Basic metabolic panel     Status: Abnormal   Collection Time: 04/07/19  6:38 AM  Result Value Ref Range   Sodium 138 135 - 145 mmol/L   Potassium 4.0 3.5 - 5.1 mmol/L  Chloride 104 98 - 111 mmol/L   CO2 23 22 - 32 mmol/L   Glucose, Bld 127 (H) 70 - 99 mg/dL   BUN 31 (H) 8 - 23 mg/dL   Creatinine, Ser 0.45 (H) 0.61 - 1.24 mg/dL   Calcium 9.2 8.9 - 40.9 mg/dL   GFR calc non Af Amer 46 (L) >60 mL/min   GFR calc Af Amer 53 (L) >60 mL/min   Anion gap 11 5 - 15    Comment: Performed at [facility], [address] [place], [place] [address]  Glucose, capillary     Status: Abnormal   Collection Time: 04/07/19  8:25 AM  Result Value Ref Range   Glucose-Capillary 120 (H) 70 - 99 mg/dL  Glucose, capillary     Status: Abnormal   Collection Time: 04/07/19 12:01 PM  Result Value Ref Range   Glucose-Capillary 460 (H) 70 - 99 mg/dL  Glucose, capillary     Status: None   Collection Time: 04/07/19  5:18 PM  Result Value Ref Range   Glucose-Capillary 92 70 - 99 mg/dL  Glucose, capillary     Status: Abnormal   Collection Time: 04/07/19  9:57 PM  Result Value Ref Range   Glucose-Capillary 181 (H) 70 - 99 mg/dL  Basic metabolic panel     Status: Abnormal   Collection Time: 04/08/19  7:16 AM  Result Value Ref Range   Sodium 137 135 - 145 mmol/L   Potassium 4.3 3.5 - 5.1 mmol/L   Chloride 104 98 - 111 mmol/L   CO2 24 22 - 32 mmol/L   Glucose, Bld 135 (H) 70 - 99 mg/dL   BUN 37 (H) 8 - 23 mg/dL   Creatinine, Ser 4.78 (H) 0.61 - 1.24 mg/dL   Calcium 9.1 8.9 - 29.5 mg/dL    GFR calc non Af Amer 45 (L) >60 mL/min   GFR calc Af Amer 52 (L) >60 mL/min   Anion gap 9 5 - 15    Comment: Performed at [facility], [address] [place], [place] [address]  CBC     Status: Abnormal   Collection Time: 04/08/19  7:16 AM  Result Value Ref Range   WBC 6.1 4.0 - 10.5 K/uL   RBC 3.97 (L) 4.22 - 5.81 MIL/uL   Hemoglobin 11.5 (L) 13.0 - 17.0 g/dL   HCT 86.5 (L) 78.4 - 69.6 %   MCV 92.2 80.0 - 100.0 fL   MCH 29.0 26.0 - 34.0 pg   MCHC 31.4 30.0 - 36.0 g/dL   RDW 29.5 (H) 28.4 - 13.2 %   Platelets 106 (L) 150 - 400 K/uL    Comment: Immature Platelet Fraction may be clinically indicated, consider ordering this additional test [id number]    nRBC 1.3 (H) 0.0 - 0.2 %    Comment: Performed at [facility], [address] [place], [place] [address]  Glucose, capillary     Status: Abnormal   Collection Time: 04/08/19  7:57 AM  Result Value Ref Range   Glucose-Capillary 123 (H) 70 - 99 mg/dL   Ct Head Wo Contrast  Result Date: 04/06/2019 CLINICAL DATA:  Subacute neuro deficits. Suspected stroke. History of atrial fibrillation, heart failure, diabetes and hypertension. EXAM: CT HEAD WITHOUT CONTRAST TECHNIQUE: Contiguous axial images were obtained from the base of the skull through the vertex without intravenous contrast. COMPARISON:  CT head 04/04/2019 and 03/04/2019. FINDINGS: Brain: There is no evidence of acute intracranial hemorrhage, mass lesion, brain edema or  extra-axial fluid collection. The ventricles and subarachnoid spaces are appropriately sized for age. The ill-defined low-density in the left frontal periventricular white matter is not significantly changed over the last 36 hours. No cortical abnormality identified. Vascular: Intracranial vascular calcifications. No hyperdense vessel identified. Skull: Negative for fracture or focal lesion. Sinuses/Orbits: The visualized paranasal sinuses and mastoid air cells are clear. No orbital abnormalities are  seen. Other: Stable deformities of the medial walls of both orbits. IMPRESSION: 1. Unchanged appearance of probable subacute nonhemorrhagic stroke in the left frontal periventricular white matter compared with prior study of 04/04/2019. 2. No evidence of acute intracranial hemorrhage or mass effect. Electronically Signed   By: Carey Bullocks M.D.   On: 04/06/2019 09:34   Ct Angio Neck W Or Wo Contrast  Result Date: 04/07/2019 CLINICAL DATA:  Right-sided weakness, known left ICA stenosis; carotid stenosis, known, follow-up. EXAM: CT ANGIOGRAPHY NECK TECHNIQUE: Multidetector CT imaging of the neck was performed using the standard protocol during bolus administration of intravenous contrast. Multiplanar CT image reconstructions and MIPs were obtained to evaluate the vascular anatomy. Carotid stenosis measurements (when applicable) are obtained utilizing NASCET criteria, using the distal internal carotid diameter as the denominator. CONTRAST:  75mL OMNIPAQUE IOHEXOL 350 MG/ML SOLN COMPARISON:  CT angiogram head/neck 06/13/2018 FINDINGS: Aortic arch: Standard branching. Imaged portions of the aortic arch demonstrate no evidence of dissection or aneurysm. Soft and calcified plaque within the visualized aortic arch and proximal major branch vessels of the neck. Right carotid system: Scattered soft and calcified plaque within the CCA. Soft plaque within the distal CCA with resultant 50% stenosis. Again demonstrated is soft and calcified plaque at the carotid bifurcation and within the proximal ICA with resultant 60% stenosis of the proximal ICA. Distal to this, the ICA is patent within the neck without significant stenosis. Heavily calcified partially visualized right internal carotid artery siphon. Sites of at least moderate stenosis within the visualized cavernous ICA (measured at 50-75% on prior CTA head 06/13/2018). Left carotid system: Scattered soft and calcified plaque within the common carotid artery without  significant stenosis. Severe soft and calcified plaque at the bifurcation and within the proximal ICA. There is either near complete occlusion of the proximal left ICA or complete occlusion with immediate reconstitution. New from prior examination, the mid to distal cervical ICA is diminutive in caliber with asymmetrically decreased enhancement. Diminished and irregular opacification is seen within the mid to distal cervical ICA and within the visualized ICA siphon Vertebral arteries: The origin of the right vertebral artery appears patent without significant stenosis, although streak artifact from a dense right-sided contrast bolus somewhat limits evaluation. Distal to this, scattered soft and calcified plaque within the cervical right vertebral artery with regions of mild-to-moderate stenosis (greatest at approximately the C5 level, series 7, image 164). Calcified plaque within the intracranial right vertebral artery without significant stenosis. The right vertebral artery is slightly dominant. Calcified plaque within the V1 left vertebral artery with moderate focal stenosis. Distal to this, there is additional scattered soft and calcified plaque within the cervical left vertebral artery with additional sites of mild to moderate stenosis. Calcified plaque within the intracranial left vertebral artery with sites of moderate/severe stenosis. Skeleton: Reversal of the expected cervical lordosis. Moderate/severe cervical spondylosis with multilevel posterior disc osteophytes, uncovertebral and facet hypertrophy. Poor dentition with numerous absent teeth. Other neck: No soft tissue neck mass or pathologically enlarged cervical chain lymph nodes. Upper chest: No consolidation within the imaged lung apices. Left internal carotid artery findings  called by telephone at the time of interpretation on 04/07/2019 at 2:25 pm to provider Dr. Hilda Lias, who verbally acknowledged these results. IMPRESSION: 1. Prominent  atherosclerotic disease within the visualized aortic arch and major branch vessels of the neck, most notably as follows. 2. Severe soft and calcified plaque within the left carotid bifurcation and proximal ICA. Near complete occlusion versus complete occlusion with immediate reconstitution within the proximal left ICA. New from prior CTA in 2019, the mid to distal cervical left ICA is diminutive in caliber with asymmetrically decreased enhancement. Diminished and irregular opacification is also seen within the visualized left carotid artery siphon. Correlate with findings on carotid artery duplex performed 04/05/2019. 3. Estimated 60% stenosis of the proximal right ICA. There is also an estimated 50% stenosis within the distal right common carotid artery. 4. Heavily calcified right internal carotid artery siphon with regions of at least moderate stenosis in the cavernous right ICA. 5. Soft and calcified plaque within the bilateral vertebral arteries. Moderate focal stenosis within the V1 left vertebral artery. Additional sites of mild/moderate stenosis within the bilateral cervical vertebral arteries. 6. Calcified plaque within the non-dominant intracranial left vertebral artery with sites of moderate/severe stenosis. Electronically Signed   By: Jackey Loge   On: 04/07/2019 14:25   Mr Brain Wo Contrast  Result Date: 04/07/2019 CLINICAL DATA:  Focal neuro deficit, greater than 6 hours, stroke suspected, right-sided weakness and positive stroke on CT scan. EXAM: MRI HEAD WITHOUT CONTRAST TECHNIQUE: Multiplanar, multiecho pulse sequences of the brain and surrounding structures were obtained without intravenous contrast. COMPARISON:  CT angiogram neck 04/07/2019, noncontrast head CT 04/06/2019, brain MRI 06/14/2018 FINDINGS: Brain: There is a 1.7 x 1.1 cm focus of restricted diffusion within the left frontal lobe subcortical white matter consistent with acute infarct. There are numerous additional punctate and  small acute infarcts within the left cerebral hemisphere which are predominantly within the left MCA/ACA and MCA/PCA watershed territories, involving the cortex as well as subcortical and deep periventricular white matter. Additional 3 mm acute infarct within the anterior left lentiform nucleus. Curvilinear cortical/subcortical acute infarct measuring 2.0 x 0.4 cm within the left occipital lobe PCA vascular territory (series 2, image 24). There is an additional punctate acute cortical/subcortical infarct within the right occipital lobe PCA vascular territory (series 2, image 22). Corresponding T2/FLAIR hyperintensity at some of the sites. Moderate scattered and confluent T2/FLAIR hyperintensity within the cerebral white matter and pons, consistent with chronic small vessel ischemic disease and progressed from prior MRI 06/14/2018. No midline shift or extra-axial fluid collection. No evidence of intracranial mass. No chronic intracranial blood products. Vascular: Please refer to CT angiogram neck performed earlier the same day for a description of diminished and irregular enhancement within the left internal carotid artery siphon. Otherwise, no definite loss of flow voids proximally. Skull and upper cervical spine: No focal marrow lesion. Sinuses/Orbits: Visualized orbits demonstrate no acute abnormality. Mild scattered paranasal sinus mucosal thickening. No significant mastoid effusion. These results were called by telephone at the time of interpretation on 04/07/2019 at 4:03 pm to provider [facility] , who verbally acknowledged these results. IMPRESSION: 1. 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 These infarcts are predominantly within the MCA/ACA and MCA/PCA watershed territories. Additional punctate acute infarct within the anterior left lentiform nucleus. 2. Small acute infarcts also present within the  bilateral occipital lobes within the PCA vascular territories. 3. Moderate chronic small vessel  ischemic disease, progressed as compared to prior MRI 06/14/2018. Electronically Signed   By: Jackey Loge   On: 04/07/2019 16:03   Ct Abdomen Pelvis W Contrast  Result Date: 04/06/2019 CLINICAL DATA:  Nausea and vomiting EXAM: CT ABDOMEN AND PELVIS WITH CONTRAST TECHNIQUE: Multidetector CT imaging of the abdomen and pelvis was performed using the standard protocol following bolus administration of intravenous contrast. CONTRAST:  OMNIPAQUE IOHEXOL 300 MG/ML  SOLN COMPARISON:  07/25/2018 FINDINGS: Lower chest: Mild cardiomegaly. Coronary atherosclerotic calcification. Hepatobiliary: Early phase imaging is essentially noncontrast. There is mosaic enhancement of the liver on the delayed phase with patchy low-density likely reflecting steatosis. There is borderline surface lobulation and the caudate lobe is large. No focal masslike area. Early hepatic venous reflux usually from elevated right heart pressure. No evidence of biliary obstruction or stone. Pancreas: Unremarkable. Spleen: Unremarkable. Adrenals/Urinary Tract: Negative adrenals. Asymmetric left renal atrophy that has developed since prior. No hydronephrosis or urinary stone. Unremarkable bladder. Stomach/Bowel: No obstruction. No evidence of bowel inflammation. Appendectomy. Vascular/Lymphatic: Extensive atherosclerotic calcification of the aorta, iliacs, and visceral branches. Bilateral superficial femoral artery occlusion and collapse. no mass or adenopathy. Reproductive:No pathologic findings. Other: No ascites or pneumoperitoneum. Fatty right paramedian umbilical hernia. Musculoskeletal: No acute abnormalities. IMPRESSION: 1. No acute finding. 2. Heterogeneous liver density on the delayed phase, favor either patchy fat infiltration or passive congestion. There are equivocal findings which could reflect cirrhosis, please correlate for risk factors. 3.  Left renal atrophy since January 2020, possible interval renal artery compromise. 4. Fatty periumbilical hernia. 5. 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 Electronically Signed   By: Marnee Spring M.D.   On: 04/06/2019 11:29    Assessment: 63 y.o. male with history of CAD, CHF, HTN, HLD, atrial fibrillation on Xarelto, CVA admitted with right sided weakness and slurred speech in whom head ct shows a left frontal hypodensity most likely c/w subacute infarct.  Plan: 1. HgbA1c, fasting lipid panel 2. MRI, MRA  of the brain without contrast 3. PT consult, OT consult, Speech consult 4. Echocardiogram 5. Carotid dopplers: given finding of ct angio on 05/2018, consider cardiovascular consult 6. Prophylactic therapy-continue home xarelto 7. Risk factor modification 8. Telemetry monitoring 9. Frequent neuro checks  04/08/2019, 8:40 AM

## 2019-04-08 NOTE — Progress Notes (Signed)
Rehab Admissions Coordinator Note:  Per PT and OT recommendation, this patient was screened by Raechel Ache for appropriateness for an Inpatient Acute Rehab Consult.  At this time, we are recommending Inpatient Rehab consult. Please have attending MD place a consult order for this patient if he is to be considered for CIR.   Raechel Ache 04/08/2019, 3:08 PM  I can be reached at [phone].

## 2019-04-08 NOTE — Progress Notes (Signed)
[employer] [facility] [place] [facility]     PATIENT NAME: [name]    MR#:  [id number]  DATE OF BIRTH:  [birth year]  SUBJECTIVE:   Pt. Here due to right sided weakness and mostly has RUE weakness.   Patient's MRI was positive for Multiple acute infarcts within the left cerebral hemisphere involving the cortex as well as subcortical and deep periventricular white matter.    Patient CTA of the neck Severe soft and calcified plaque within the left carotid bifurcation and proximal ICA. Near complete occlusion versus complete occlusion with immediate reconstitution within the proximal left ICA.  REVIEW OF SYSTEMS:    Review of Systems  Constitutional: Negative for chills and fever.  HENT: Negative for congestion and tinnitus.   Eyes: Negative for blurred vision and double vision.  Respiratory: Negative for cough, shortness of breath and wheezing.   Cardiovascular: Negative for chest pain, orthopnea and PND.  Gastrointestinal: Negative for abdominal pain, diarrhea, nausea and vomiting.  Genitourinary: Negative for dysuria and hematuria.  Neurological: Positive for weakness (Right Upper Ext.  ). Negative for dizziness, sensory change and focal weakness.  All other systems reviewed and are negative.   Nutrition: Dysphagia III Tolerating Diet: Yes Tolerating PT: Eval noted.    DRUG ALLERGIES:   Allergies  Allergen Reactions   Aspirin Other (See Comments)    Told not to take because of blood thinner   Entresto [Sacubitril-Valsartan] Other (See Comments)    Dizziness, weakness and stuttering   Ibuprofen Nausea And Vomiting   Tylenol [Acetaminophen] Nausea And Vomiting    VITALS:  Blood pressure 119/89, pulse (!) 121, temperature (!) 97.5 F (36.4 C), temperature source Oral, resp. rate 17, height  (1.803 m), weight 99.8 kg, SpO2 99 %.  PHYSICAL EXAMINATION:   Physical Exam  GENERAL:  63 y.o.-year-old patient lying in bed in no acute distress.    EYES: Pupils equal, round, reactive to light and accommodation. No scleral icterus. Extraocular muscles intact.  HEENT: Head atraumatic, normocephalic. Oropharynx and nasopharynx clear.  NECK:  Supple, no jugular venous distention. No thyroid enlargement, no tenderness.  LUNGS: Normal breath sounds bilaterally, no wheezing, rales, rhonchi. No use of accessory muscles of respiration.  CARDIOVASCULAR: S1, S2 normal. No murmurs, rubs, or gallops.  ABDOMEN: Soft, nontender, nondistended. Bowel sounds present. No organomegaly or mass.  EXTREMITIES: No cyanosis, clubbing or edema b/l.    NEUROLOGIC: Cranial nerves II through XII are intact. Right upper Ext. Weakness 3/5 Strength. Globally weak.   PSYCHIATRIC: The patient is alert and oriented x 3.  SKIN: No obvious rash, lesion, or ulcer.    LABORATORY PANEL:   CBC Recent Labs  Lab 04/08/19 0716  WBC 6.1  HGB 11.5*  HCT 36.6*  PLT 106*   ------------------------------------------------------------------------------------------------------------------  Chemistries  Recent Labs  Lab 04/04/19 2041  04/08/19 0716  NA 135   < > 137  K 3.6   < > 4.3  CL 99   < > 104  CO2 24   < > 24  GLUCOSE 116*   < > 135*  BUN 26*   < > 37*  CREATININE 1.45*   < > 1.61*  CALCIUM 9.8   < > 9.1  AST 30  --   --   ALT 35  --   --   ALKPHOS 209*  --   --   BILITOT 1.8*  --   --    < > = values in this interval not  displayed.   ------------------------------------------------------------------------------------------------------------------  Cardiac Enzymes No results for input(s): TROPONINI in the last 168 hours. ------------------------------------------------------------------------------------------------------------------  RADIOLOGY:  Ct Angio Neck W Or Wo Contrast  Result Date: 04/07/2019 CLINICAL DATA:  Right-sided weakness, known left ICA stenosis; carotid stenosis, known, follow-up. EXAM: CT ANGIOGRAPHY NECK TECHNIQUE: Multidetector  CT imaging of the neck was performed using the standard protocol during bolus administration of intravenous contrast. Multiplanar CT image reconstructions and MIPs were obtained to evaluate the vascular anatomy. Carotid stenosis measurements (when applicable) are obtained utilizing NASCET criteria, using the distal internal carotid diameter as the denominator. CONTRAST:  9mL OMNIPAQUE IOHEXOL 350 MG/ML SOLN COMPARISON:  CT angiogram head/neck 06/13/2018 FINDINGS: Aortic arch: Standard branching. Imaged portions of the aortic arch demonstrate no evidence of dissection or aneurysm. Soft and calcified plaque within the visualized aortic arch and proximal major branch vessels of the neck. Right carotid system: Scattered soft and calcified plaque within the CCA. Soft plaque within the distal CCA with resultant 50% stenosis. Again demonstrated is soft and calcified plaque at the carotid bifurcation and within the proximal ICA with resultant 60% stenosis of the proximal ICA. Distal to this, the ICA is patent within the neck without significant stenosis. Heavily calcified partially visualized right internal carotid artery siphon. Sites of at least moderate stenosis within the visualized cavernous ICA (measured at 50-75% on prior CTA head 06/13/2018). Left carotid system: Scattered soft and calcified plaque within the common carotid artery without significant stenosis. Severe soft and calcified plaque at the bifurcation and within the proximal ICA. There is either near complete occlusion of the proximal left ICA or complete occlusion with immediate reconstitution. New from prior examination, the mid to distal cervical ICA is diminutive in caliber with asymmetrically decreased enhancement. Diminished and irregular opacification is seen within the mid to distal cervical ICA and within the visualized ICA siphon Vertebral arteries: The origin of the right vertebral artery appears patent without significant stenosis, although  streak artifact from a dense right-sided contrast bolus somewhat limits evaluation. Distal to this, scattered soft and calcified plaque within the cervical right vertebral artery with regions of mild-to-moderate stenosis (greatest at approximately the C5 level, series 7, image 164). Calcified plaque within the intracranial right vertebral artery without significant stenosis. The right vertebral artery is slightly dominant. Calcified plaque within the V1 left vertebral artery with moderate focal stenosis. Distal to this, there is additional scattered soft and calcified plaque within the cervical left vertebral artery with additional sites of mild to moderate stenosis. Calcified plaque within the intracranial left vertebral artery with sites of moderate/severe stenosis. Skeleton: Reversal of the expected cervical lordosis. Moderate/severe cervical spondylosis with multilevel posterior disc osteophytes, uncovertebral and facet hypertrophy. Poor dentition with numerous absent teeth. Other neck: No soft tissue neck mass or pathologically enlarged cervical chain lymph nodes. Upper chest: No consolidation within the imaged lung apices. Left internal carotid artery findings called by telephone at the time of interpretation on 04/07/2019 at 2:25 pm to provider Dr. Abel Presto, who verbally acknowledged these results. IMPRESSION: 1. Prominent atherosclerotic disease within the visualized aortic arch and major branch vessels of the neck, most notably as follows. 2. Severe soft and calcified plaque within the left carotid bifurcation and proximal ICA. Near complete occlusion versus complete occlusion with immediate reconstitution within the proximal left ICA. New from prior CTA in 2019, the mid to distal cervical left ICA is diminutive in caliber with asymmetrically decreased enhancement. Diminished and irregular opacification is also seen within the visualized left  carotid artery siphon. Correlate with findings on carotid  artery duplex performed 04/05/2019. 3. Estimated 60% stenosis of the proximal right ICA. There is also an estimated 50% stenosis within the distal right common carotid artery. 4. Heavily calcified right internal carotid artery siphon with regions of at least moderate stenosis in the cavernous right ICA. 5. Soft and calcified plaque within the bilateral vertebral arteries. Moderate focal stenosis within the V1 left vertebral artery. Additional sites of mild/moderate stenosis within the bilateral cervical vertebral arteries. 6. Calcified plaque within the non-dominant intracranial left vertebral artery with sites of moderate/severe stenosis. Electronically Signed   By: Jackey Loge   On: 04/07/2019 14:25   Mr Brain Wo Contrast  Result Date: 04/07/2019 CLINICAL DATA:  Focal neuro deficit, greater than 6 hours, stroke suspected, right-sided weakness and positive stroke on CT scan. EXAM: MRI HEAD WITHOUT CONTRAST TECHNIQUE: Multiplanar, multiecho pulse sequences of the brain and surrounding structures were obtained without intravenous contrast. COMPARISON:  CT angiogram neck 04/07/2019, noncontrast head CT 04/06/2019, brain MRI 06/14/2018 FINDINGS: Brain: There is a 1.7 x 1.1 cm focus of restricted diffusion within the left frontal lobe subcortical white matter consistent with acute infarct. There are numerous additional punctate and small acute infarcts within the left cerebral hemisphere which are predominantly within the left MCA/ACA and MCA/PCA watershed territories, involving the cortex as well as subcortical and deep periventricular white matter. Additional 3 mm acute infarct within the anterior left lentiform nucleus. Curvilinear cortical/subcortical acute infarct measuring 2.0 x 0.4 cm within the left occipital lobe PCA vascular territory (series 2, image 24). There is an additional punctate acute cortical/subcortical infarct within the right occipital lobe PCA vascular territory (series 2, image 22).  Corresponding T2/FLAIR hyperintensity at some of the sites. Moderate scattered and confluent T2/FLAIR hyperintensity within the cerebral white matter and pons, consistent with chronic small vessel ischemic disease and progressed from prior MRI 06/14/2018. No midline shift or extra-axial fluid collection. No evidence of intracranial mass. No chronic intracranial blood products. Vascular: Please refer to CT angiogram neck performed earlier the same day for a description of diminished and irregular enhancement within the left internal carotid artery siphon. Otherwise, no definite loss of flow voids proximally. Skull and upper cervical spine: No focal marrow lesion. Sinuses/Orbits: Visualized orbits demonstrate no acute abnormality. Mild scattered paranasal sinus mucosal thickening. No significant mastoid effusion. These results were called by telephone at the time of interpretation on 04/07/2019 at 4:03 pm to provider [facility] , who verbally acknowledged these results. IMPRESSION: 1. 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 These infarcts are predominantly within the MCA/ACA and MCA/PCA watershed territories. Additional punctate acute infarct within the anterior left lentiform nucleus. 2. Small acute infarcts also present within the bilateral occipital lobes within the PCA vascular territories. 3. Moderate chronic small vessel ischemic disease, progressed as compared to prior MRI 06/14/2018. Electronically Signed   By: Jackey Loge   On: 04/07/2019 16:03     ASSESSMENT AND PLAN:   63 year old male with past medical history of atrial fibrillation, ischemic cardiomyopathy, hypertension, hyperlipidemia, history of gout, diabetes, COPD, chronic combined systolic diastolic CHF, carotid artery disease who presented to the hospital due to right upper extremity weakness and noted to have an acute CVA.  1.   Acute/subacute CVA-this was noted on the CT scan of the head on admission. -Patient was refusing MRI yesterday but agreed to it yesterday which showed Multiple  acute infarcts within the left cerebral hemisphere involving the cortex as well as subcortical and deep periventricular white matter.   - Continue Xarelto, patient has previous history of carotid artery stenosis and is refused treatment in the past. - CTA of the neck yesterday showing Severe soft and calcified plaque within the left carotid bifurcation and proximal ICA. Near complete occlusion versus complete occlusion with immediate reconstitution within the proximal left ICA. - cont. PT and OT as tolerated.   2.  Carotid artery stenosis-patient has previous history of left-sided carotid stenosis and has been noncompliant with intervention as per vascular surgery. - CTA neck yesterday showed Severe soft and calcified plaque within the left carotid bifurcation and proximal ICA. Near complete occlusion versus complete occlusion with immediate reconstitution within the proximal left ICA. - Discussed with vascular surgery and plan for Carotid Angiogram today.  -Continue Xarelto, atorvastatin for now.  3. AMS/Encephalopathy -this afternoon patient was noted to be altered and confused and slightly aphasic and therefore code stroke was initiated.  Seen by neurology and patient is now back to baseline.  CT head was ordered and results are still pending.  Neurology thinks this could possibly be seizures.  If CT is negative plan on starting IV Keppra and doing an EEG. -We will continue to follow mental status.  4. Essential Hypertension - cont. Coreg, Aldactone  5. Hx of chronic Systolic CHF - clinically not in CHF.  - hold Lasix for now.  Cont. Aldactone, Coreg.    6. Hyperlipidemia - cont. Atorvastatin  7. Hx of COPD - no acute exacerbation.  - cont. Symbicort, duonebs as needed, Spiriva.   8.  Diabetes type 2 with hyperglycemia-  patient's A1c was as high as 8. - Continue sliding scale insulin, low dose Lantus.  BS stable.    - await DM Coordinator input.     All the records are reviewed and case discussed with Care Management/Social Worker. Management plans discussed with the patient, family and they are in agreement.  CODE STATUS: Full code  DVT Prophylaxis: Xarelto  TOTAL TIME TAKING CARE OF THIS PATIENT: 40 minutes.   POSSIBLE D/C IN 1-2 DAYS, DEPENDING ON CLINICAL CONDITION.   Houston SirenVivek J Ples Trudel M.D on 04/08/2019 at 1:29 PM  Between 7am to 6pm - Pager - [phone]  After 6pm go to [email or web address] - Therapist, nutritionalpassword EPAS [email or web address]  [facility]  Office  [phone]  CC: Primary care physician; [facility]

## 2019-04-08 NOTE — Progress Notes (Addendum)
Called by Dr Verdell Carmine: patient unresponsive, not following commands Per nurse, wife noted "soemthing wrong with patient". Patient was lethargic, mumbling,  and intermittently following commands Upon my arrival. Patient awake, following commands Neurological exam same as this AM Alert, awake, following commands, slight dysrathria PERLA, EOMI, no nystagmus, VFF, face seems symmetrical ( questionab;le right facial), face sensation tot touch seems symmetrical, uukla and tongue midline He is 4/5 in all extremities. He is 1/5 in right wrist flexion/extension No coordination deficit appreciated DTR not checked at time of examination Gait: not checked  Given the presentation (patient lethargic then back to baseline), and structural brain disease, he might have suffered a seizure/ brain hypoperfusion given his advanced athersclerosis and stenosis  Recs:  - - Continue neuro protectives measures including normothermia, normoglycemia, correct electrolytes/metabolic abnlites, treat any infection while admitted - Would not control patient BP very tightly given his atherosclerosis to allow for some brain perfusion - head flat as feasible or not feasible at 15 % to allow for brain perfusion - if head ct with no acute abnlites: Keppra load 1gm then 500 mg q 12hr - obtain EEG - seizure precautions, ativan prn sz.  - Check BMP/CBC./mg/procal - if spikes fever BCX/UCX -  Continue Xarelto

## 2019-04-08 NOTE — Progress Notes (Signed)
Occupational Therapy Treatment Patient Details Name: [name] MRN: [id number] DOB: [birth year] Today's Date: 04/08/2019    History of present illness 63 yo male presented to ED 10/5 with dysarthria and mild R sided weakness, pt treated earlier in the day for [facility] and CHF, CT scan shows left frontal infarct. PMH includes CAD, CKD, COPD, DM, HLD, HTN, persistent Afib, stroke 05/2018   OT comments  Pt seen for OT tx this date. Pt presents with worsening strength/coordination deficits on R side since recent OT evaluation. Pt now requiring increased assist for functional ADL tasks and mobility tasks, particularly involving RUE. RUE noted with mild edema which spouse reports she noticed as well. Retrograde massage performed and RUE positioned with education provided in positioning and encouraging use of RUE as much as possible during the day. Pt/spouse verbalized understanding. Given decline in functional performance, discharge recommendations and therapy frequency updated to CIR this date. Will continue to progress.   Follow Up Recommendations  CIR    Equipment Recommendations  3 in 1 bedside commode    Recommendations for Other Services Rehab consult    Precautions / Restrictions Precautions Precautions: Fall Restrictions Weight Bearing Restrictions: No       Mobility Bed Mobility Overal bed mobility: Modified Independent             General bed mobility comments: increased time to initiate and use of rail noted  Transfers Overall transfer level: Needs assistance Equipment used: Rolling walker (2 wheeled) Transfers: Sit to/from Stand Sit to Stand: Min assist;+2 safety/equipment;+2 physical assistance         General transfer comment: stood to walker with min a x 2.  he is able to step in place but generally cautions.  unable to grip walker with R hand.  pt voices feeling unsteady and like he is having more trouble today with mobility and strength.    Balance Overall  balance assessment: Needs assistance Sitting-balance support: Feet supported Sitting balance-Leahy Scale: Good     Standing balance support: Bilateral upper extremity supported Standing balance-Leahy Scale: Poor Standing balance comment: uses walker for support, while somewhat steady with static standing, hesitant for any dynamic activities.                           ADL either performed or assessed with clinical judgement   ADL Overall ADL's : Needs assistance/impaired Eating/Feeding: NPO                                     General ADL Comments: Pt now requiring Min-Mod A for ADL tasks, difficulty using RUE for grooming and self feeding tasks     Vision Patient Visual Report: No change from baseline Vision Assessment?: No apparent visual deficits   Perception     Praxis      Cognition Arousal/Alertness: Lethargic Behavior During Therapy: Flat affect Overall Cognitive Status: Within Functional Limits for tasks assessed                                 General Comments: follows commands, but seems more lethargic today, reports being frustrated because he is hungry and wants food but he is pending a procedure/test that he must be NPO for        Exercises Other Exercises Other Exercises: RUE now demonstrates decreased strength  and coordination from previous evaluation date; grossly 3/5, pt denies sensory deficits, R hand appears slightly swollen compared to L and wife in room agrees; retrograde massage performed and RUE positioned with education provided in positioning and encouraging use of RUE as much as possible during the day. Pt/spouse verbalized understanding.   Shoulder Instructions       General Comments      Pertinent Vitals/ Pain       Pain Assessment: No/denies pain  Home Living                                          Prior Functioning/Environment              Frequency  Min 3X/week         Progress Toward Goals  OT Goals(current goals can now be found in the care plan section)  Progress towards OT goals: OT to reassess next treatment  Acute Rehab OT Goals Patient Stated Goal: return home OT Goal Formulation: With patient/family Time For Goal Achievement: 20-Apr-2019 Potential to Achieve Goals: Good  Plan Discharge plan needs to be updated;Frequency needs to be updated    Co-evaluation                 AM-PAC OT "6 Clicks" Daily Activity     Outcome Measure   Help from another person eating meals?: A Little Help from another person taking care of personal grooming?: A Little Help from another person toileting, which includes using toliet, bedpan, or urinal?: A Little Help from another person bathing (including washing, rinsing, drying)?: A Little Help from another person to put on and taking off regular upper body clothing?: A Little Help from another person to put on and taking off regular lower body clothing?: A Little 6 Click Score: 18    End of Session    OT Visit Diagnosis: Other abnormalities of gait and mobility (R26.89);Hemiplegia and hemiparesis Hemiplegia - Right/Left: Right Hemiplegia - dominant/non-dominant: Dominant Hemiplegia - caused by: Cerebral infarction   Activity Tolerance Patient tolerated treatment well   Patient Left in bed;with call bell/phone within reach;with bed alarm set;with family/visitor present   Nurse Communication Other (comment)(pt's HR, pt report nausea)        Time: 1349-1400 OT Time Calculation (min): 11 min  Charges: OT General Charges $OT Visit: 1 Visit OT Treatments $Therapeutic Activity: 8-22 mins  Jeni Salles, MPH, MS, OTR/L ascom [phone] 04/08/19, 2:44 PM

## 2019-04-08 NOTE — Op Note (Signed)
[facility]   OPERATIVE NOTE  DATE: 04/08/2019  PRE-OPERATIVE DIAGNOSIS: 1.  Occlusion left carotid artery  2.  Ambiguity of CT angios cervical carotids 3.  Acute CVA  POST-OPERATIVE DIAGNOSIS: 1.  String sign (greater than 99%) left internal carotid artery 2.  Acute CVA  PROCEDURE: 1.   Ultrasound Guidance for vascular access right femoral artery 2.   Catheter placement into left common carotid artery from right femoral approach 3.   Thoracic aortogram 4.   Cervical left carotid angiogram 5.   StarClose closure device right femoral artery  SURGEON: Levora Dredge, MD  ASSISTANT(S): None  ANESTHESIA: Moderate conscious sedation  ESTIMATED BLOOD LOSS: Less than 10 cc  FLUORO TIME: 1.8  CONTRAST: 40  MODERATE CONSCIOUS SEDATION TIME: Continuous ECG pulse oximetry and cardiopulmonary monitoring was performed throughout the entire procedure by the interventional radiology nurse total sedation time was 35 minutes.  FINDING(S): 1.  String sign left internal carotid artery  SPECIMEN(S):  None  INDICATIONS:   Patient is a 63 y.o.male who presents with acute CVA and right-sided weakness.  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  At this time the patient has had a duplex ultrasound and a carotid CT angiogram which are being reported as occlusion of the proximal internal carotid artery.  However there appears to be flow within the internal carotid artery from just above the level of the bifurcation and distally.  This is just congruous and angiography is being performed to reconcile this discrepancy.  Risks and benefits are discussed and informed consent was obtained.  DESCRIPTION: After obtaining full informed written consent, the patient was brought back to the operating room and placed supine upon the vascular suite table.  After obtaining adequate anesthesia, the patient  was prepped and draped in the standard fashion.  Moderate conscious sedation was administered during a face to face encounter with the patient throughout the procedure with my supervision of the RN administering medicines and monitoring the patients vital signs and mental status throughout from the start of the procedure until the patient was taken to the recovery room. The right femoral artery was visualized with ultrasound and found to be calcific but patent. It was then accessed under direct ultrasound guidance without difficulty with a micropuncture needle.  Microwire followed by micro-sheath were inserted.  A J-wire and 5 French sheath were placed and a permanent image was recorded.   The patient was given 3000 units of intravenous heparin.   A pigtail catheter was placed into the ascending aorta and an LAO projection thoracic aortogram was performed. This showed a type I aortic arch ostial great vessels are widely patent.  I then selected a H1 catheter and cannulated the left common carotid artery without difficulty and advanced into the distal left common carotid artery. Selective imaging was then performed of the cervical.  This demonstrated wide patency of the common carotid from its origin up to the bulb.  There is dense plaque within the carotid bifurcation however it is not occluded and there is a string sign which fills the proximal internal carotid artery.  The internal carotid artery is then widely patent without significant disease up to the petrous portion.  At this point, we had imaging to plan our treatment and we elected to terminate the procedure. The diagnostic catheter was removed. Oblique arteriogram was performed of the right femoral artery and StarClose closure device was  deployed in usual fashion with excellent hemostatic result. The patient tolerated the procedure well and was taken to the recovery room in stable condition.  COMPLICATIONS: None  CONDITION: Stable   Hortencia Pilar 04/08/2019 6:16 PM   This note was created with Dragon Medical transcription system. Any errors in dictation are purely unintentional.

## 2019-04-08 NOTE — Plan of Care (Signed)
Pt does not always participate in care and at times it is difficult to determine of patient does not want to do what is asked or there is a decline. Pt was SOB earlier in shift even though his O2 was > 95%. Will continue to monitor.

## 2019-04-08 NOTE — Progress Notes (Signed)
Physical Therapy Treatment Patient Details Name: [name] MRN: [id number] DOB: [birth year] Today's Date: 04/08/2019    History of Present Illness 63 yo male presented to ED 10/5 with dysarthria and mild R sided weakness, pt treated earlier in the day for [facility] and CHF, CT scan shows left frontal infarct. PMH includes CAD, CKD, COPD, DM, HLD, HTN, persistent Afib, stroke 05/2018    PT Comments    Pt in bed, generally lethargic but wanting to participate in session.  HR 120 at rest and max 122 with activity below.  Sat EOB with rail and no assist.  Participated in exercises as described below.  Stood to walker with min a x 2.  He was able to march hesitantly in place but further mobility was limited due to lethargy and pt stating that he feels overall weaker.  Returned to supine and assisted him in calling his wife per his request.    Pt is not progressing towards goals.  He continues with daily decline in mobility and requires increased assist with increased weakness noted R side.  Message to RN regarding concerns.  Message sent to care manager in regards to discharge plan.  Original recommendations for HHPT but due to continued decline, CIR may be most appropriate for pt at discharge to return to baseline.    Will schedule with PT tomorrow for visit due to general decline with mobility and strength.   Follow Up Recommendations  CIR     Equipment Recommendations  Other (comment)    Recommendations for Other Services       Precautions / Restrictions Precautions Precautions: Fall Restrictions Weight Bearing Restrictions: No    Mobility  Bed Mobility Overal bed mobility: Modified Independent             General bed mobility comments: increased time to initiate and use of rail noted  Transfers Overall transfer level: Needs assistance Equipment used: Rolling walker (2 wheeled) Transfers: Sit to/from Stand Sit to Stand: Min assist;+2 safety/equipment;+2 physical  assistance         General transfer comment: stood to walker with min a x 2.  he is able to step in place but generally cautions.  unable to grip walker with R hand.  pt voices feeling unsteady and like he is having more trouble today with mobility and strength.  Ambulation/Gait             General Gait Details: deferred to safety   Stairs             Wheelchair Mobility    Modified Rankin (Stroke Patients Only)       Balance Overall balance assessment: Needs assistance Sitting-balance support: Feet supported Sitting balance-Leahy Scale: Good     Standing balance support: Bilateral upper extremity supported Standing balance-Leahy Scale: Poor Standing balance comment: uses walker for support, while somewhat steady with static standing, hesitant for any dynamic activities.                            Cognition Arousal/Alertness: Lethargic Behavior During Therapy: WFL for tasks assessed/performed Overall Cognitive Status: Within Functional Limits for tasks assessed                                 General Comments: no agitation noted today but lethargy remains      Exercises Other Exercises Other Exercises: seated AROM - unable to  complete full ROM despite cues without AA - LAQ, marches, ab/add    General Comments        Pertinent Vitals/Pain Pain Assessment: No/denies pain    Home Living                      Prior Function            PT Goals (current goals can now be found in the care plan section) Progress towards PT goals: Not progressing toward goals - comment    Frequency    7X/week      PT Plan Discharge plan needs to be updated    Co-evaluation              AM-PAC PT "6 Clicks" Mobility   Outcome Measure  Help needed turning from your back to your side while in a flat bed without using bedrails?: None Help needed moving from lying on your back to sitting on the side of a flat bed without  using bedrails?: None Help needed moving to and from a bed to a chair (including a wheelchair)?: A Lot Help needed standing up from a chair using your arms (e.g., wheelchair or bedside chair)?: A Little Help needed to walk in hospital room?: A Lot Help needed climbing 3-5 steps with a railing? : Total 6 Click Score: 16    End of Session Equipment Utilized During Treatment: Gait belt Activity Tolerance: Patient tolerated treatment well;Patient limited by lethargy;Other (comment) Patient left: in bed;with call bell/phone within reach;with bed alarm set         Time: 3568-6168 PT Time Calculation (min) (ACUTE ONLY): 19 min  Charges:  $Therapeutic Exercise: 8-22 mins                     Chesley Noon, PTA 04/08/19, 1:47 PM

## 2019-04-08 NOTE — OR Nursing (Signed)
Noted ekg rhythm of atrial fib low 100's prior to tranfer to VIR, pt reprots chest pain but when questioned indicated it might have been happening for up to two weeks. But pt poor historian with other clarification requests Dr Delana Meyer notified. When transferred to table noted rate 120's. Dr Darlyn Chamber

## 2019-04-09 ENCOUNTER — Inpatient Hospital Stay: Payer: No Typology Code available for payment source

## 2019-04-09 DIAGNOSIS — I5020 Unspecified systolic (congestive) heart failure: Secondary | ICD-10-CM

## 2019-04-09 DIAGNOSIS — I639 Cerebral infarction, unspecified: Secondary | ICD-10-CM

## 2019-04-09 DIAGNOSIS — I4819 Other persistent atrial fibrillation: Secondary | ICD-10-CM

## 2019-04-09 LAB — BASIC METABOLIC PANEL
Anion gap: 12 (ref 5–15)
BUN: 38 mg/dL — ABNORMAL HIGH (ref 8–23)
CO2: 18 mmol/L — ABNORMAL LOW (ref 22–32)
Calcium: 9.1 mg/dL (ref 8.9–10.3)
Chloride: 108 mmol/L (ref 98–111)
Creatinine, Ser: 2.01 mg/dL — ABNORMAL HIGH (ref 0.61–1.24)
GFR calc Af Amer: 40 mL/min — ABNORMAL LOW (ref >60)
GFR calc non Af Amer: 35 mL/min — ABNORMAL LOW (ref >60)
Glucose, Bld: 134 mg/dL — ABNORMAL HIGH (ref 70–99)
Potassium: 5.2 mmol/L — ABNORMAL HIGH (ref 3.5–5.1)
Sodium: 138 mmol/L (ref 135–145)

## 2019-04-09 LAB — CBC WITH DIFFERENTIAL/PLATELET
Abs Immature Granulocytes: 0.02 10*3/uL (ref 0.00–0.07)
Basophils Absolute: 0.1 10*3/uL (ref 0.0–0.1)
Basophils Relative: 1 %
Eosinophils Absolute: 0.1 10*3/uL (ref 0.0–0.5)
Eosinophils Relative: 1 %
HCT: 40.1 % (ref 39.0–52.0)
Hemoglobin: 12.2 g/dL — ABNORMAL LOW (ref 13.0–17.0)
Immature Granulocytes: 0 %
Lymphocytes Relative: 18 %
Lymphs Abs: 1.2 10*3/uL (ref 0.7–4.0)
MCH: 29 pg (ref 26.0–34.0)
MCHC: 30.4 g/dL (ref 30.0–36.0)
MCV: 95.5 fL (ref 80.0–100.0)
Monocytes Absolute: 0.5 10*3/uL (ref 0.1–1.0)
Monocytes Relative: 7 %
Neutro Abs: 4.7 10*3/uL (ref 1.7–7.7)
Neutrophils Relative %: 73 %
Platelets: 150 10*3/uL (ref 150–400)
RBC: 4.2 MIL/uL — ABNORMAL LOW (ref 4.22–5.81)
RDW: 21.2 % — ABNORMAL HIGH (ref 11.5–15.5)
WBC: 6.5 10*3/uL (ref 4.0–10.5)
nRBC: 4.1 % — ABNORMAL HIGH (ref 0.0–0.2)

## 2019-04-09 LAB — GLUCOSE, CAPILLARY
Glucose-Capillary: 142 mg/dL — ABNORMAL HIGH (ref 70–99)
Glucose-Capillary: 130 mg/dL — ABNORMAL HIGH (ref 70–99)
Glucose-Capillary: 120 mg/dL — ABNORMAL HIGH (ref 70–99)
Glucose-Capillary: 140 mg/dL — ABNORMAL HIGH (ref 70–99)

## 2019-04-09 MED ORDER — MORPHINE SULFATE (PF) 2 MG/ML IV SOLN
1.0000 mg | Freq: Once | INTRAVENOUS | Status: AC
  Administered 2019-04-10: 1 mg via INTRAVENOUS
  Filled 2019-04-09: qty 1

## 2019-04-09 MED ORDER — MORPHINE SULFATE (PF) 2 MG/ML IV SOLN
1.0000 mg | INTRAVENOUS | Status: DC | PRN
  Administered 2019-04-10: 1 mg via INTRAVENOUS
  Filled 2019-04-09: qty 1

## 2019-04-09 MED ORDER — SODIUM CHLORIDE 0.9 % IV BOLUS
1000.0000 mL | Freq: Once | INTRAVENOUS | Status: AC
  Administered 2019-04-09: 1000 mL via INTRAVENOUS

## 2019-04-09 MED ORDER — FUROSEMIDE 10 MG/ML IJ SOLN
40.0000 mg | Freq: Two times a day (BID) | INTRAMUSCULAR | Status: DC
  Administered 2019-04-09 – 2019-04-11 (×5): 40 mg via INTRAVENOUS
  Filled 2019-04-09 (×5): qty 4

## 2019-04-09 MED ORDER — ALPRAZOLAM 0.5 MG PO TABS
0.5000 mg | ORAL_TABLET | Freq: Once | ORAL | Status: AC
  Administered 2019-04-09: 02:00:00 0.5 mg via ORAL
  Filled 2019-04-09: qty 1

## 2019-04-09 NOTE — Progress Notes (Signed)
Physical Therapy Treatment Patient Details Name: [name] MRN: [id number] DOB: [birth year] Today's Date: 04/09/2019    History of Present Illness 63 yo male presented to ED 10/5 with dysarthria and mild R sided weakness, pt treated earlier in the day for [facility] and CHF, CT scan shows left frontal infarct. PMH includes CAD, CKD, COPD, DM, HLD, HTN, persistent Afib, stroke 05/2018    PT Comments    Pt presented with deficits in strength, transfers, mobility, gait, balance, and activity tolerance.  Pt required Mod A with bed mobility tasks along with verbal cues for general sequencing.  Pt was able to maintain static sitting balance at the EOB without assist and required min A to come to standing.  Pt required min A to advance the RLE with side-stepping at the EOB but was steady without LOB.  Nursing in room prior to session assessing pt's R-sided strength with pt possibly experiencing evolving CVA symptoms.  Pt motivated to participate throughout the session.  SpO2 noted to be in the low to mid 80s when pt removed his nasal cannula and back to the mid 90s with canula donned, nursing notified.  Pt will benefit from PT services in an IR setting upon discharge to safely address above deficits for decreased caregiver assistance and eventual return to PLOF.     Follow Up Recommendations  CIR     Equipment Recommendations  Other (comment)(TBD at next venue of care)    Recommendations for Other Services       Precautions / Restrictions Precautions Precautions: Fall Restrictions Weight Bearing Restrictions: No    Mobility  Bed Mobility  Overal bed mobility: Needs Assistance Bed Mobility: Supine to Sit;Sit to Supine    Rolling: Mod assist Supine to sit: Mod assist Sit to supine: Mod assist   General bed mobility comments: Mod A for BLEs on/off bed and for trunk to full upright position  Transfers Overall transfer level: Needs assistance Equipment used: Rolling walker (2  wheeled) Transfers: Sit to/from Stand Sit to Stand: Min assist;From elevated surface;+2 safety/equipment         General transfer comment: Min A to stand from an elevated surface with assistance to place and hold the RUE onto the RW  Ambulation/Gait Ambulation/Gait assistance: Min assist Gait Distance (Feet): 3 Feet Assistive device: Rolling walker (2 wheeled) Gait Pattern/deviations: Step-to pattern Gait velocity: decreased   General Gait Details: Min A to advance the RLE during side stepping at the EOB; no RLE buckling noted during RLE stance phase with no assistance required to prevent LOB in standing   Stairs             Wheelchair Mobility    Modified Rankin (Stroke Patients Only)       Balance Overall balance assessment: Needs assistance Sitting-balance support: Feet supported Sitting balance-Leahy Scale: Fair     Standing balance support: Bilateral upper extremity supported Standing balance-Leahy Scale: Fair Standing balance comment: No LOB in static standing or during side-stepping                            Cognition Arousal/Alertness: Lethargic Behavior During Therapy: Flat affect Overall Cognitive Status: No family/caregiver present to determine baseline cognitive functioning                                 General Comments: Followed 1-step commands well during the session  Exercises Total Joint Exercises Heel Slides: AAROM;Right;5 reps Hip ABduction/ADduction: AAROM;Right;5 reps Straight Leg Raises: AAROM;Right;5 reps Long Arc Quad: AROM;Both;10 reps Knee Flexion: AROM;Both;10 reps Other Exercises: Rolling left/right and sup to/from sit training Other Exercises: Multiple sit to/from stands from an elevated EOB      General Comments        Pertinent Vitals/Pain Pain Assessment: No/denies pain    Home Living                      Prior Function            PT Goals (current goals can now be  found in the care plan section) Progress towards PT goals: Not progressing toward goals - comment(Possible evolving CVA)    Frequency    7X/week      PT Plan Current plan remains appropriate    Co-evaluation              AM-PAC PT "6 Clicks" Mobility   Outcome Measure  Help needed turning from your back to your side while in a flat bed without using bedrails?: A Lot Help needed moving from lying on your back to sitting on the side of a flat bed without using bedrails?: A Lot Help needed moving to and from a bed to a chair (including a wheelchair)?: A Lot Help needed standing up from a chair using your arms (e.g., wheelchair or bedside chair)?: A Little Help needed to walk in hospital room?: A Lot Help needed climbing 3-5 steps with a railing? : Total 6 Click Score: 12    End of Session Equipment Utilized During Treatment: Gait belt Activity Tolerance: Patient tolerated treatment well Patient left: in bed;with call bell/phone within reach;with bed alarm set;with nursing/sitter in room Nurse Communication: Mobility status PT Visit Diagnosis: Difficulty in walking, not elsewhere classified (R26.2);Muscle weakness (generalized) (M62.81);Other symptoms and signs involving the nervous system (Z61.096)     Time: 0940-1003 PT Time Calculation (min) (ACUTE ONLY): 23 min  Charges:  $Therapeutic Exercise: 8-22 mins $Therapeutic Activity: 8-22 mins                     D. Scott Iain Sawchuk PT, DPT 04/09/19, 12:45 PM

## 2019-04-09 NOTE — Care Management (Signed)
Order in for inpatient rehab and Claiborne Billings OT is following.

## 2019-04-09 NOTE — Progress Notes (Signed)
Subjective: Interval History: has no complaint of swallowing difficulty or headache. He was sleepy but arousable.   Objective: Vital signs in last 24 hours: Temp:  [97.5 F (36.4 C)-98.3 F (36.8 C)] 97.8 F (36.6 C) (10/10 0837) Pulse Rate:  [42-122] 120 (10/10 0837) Resp:  [0-24] 18 (10/10 0837) BP: (85-145)/(65-103) 133/91 (10/10 0837) SpO2:  [89 %-100 %] 99 % (10/10 1021)  Intake/Output from previous day: 10/09 0701 - 10/10 0700 In: 1361.3 [I.V.:1261.3; IV Piggyback:100] Out: 210 [Urine:210] Intake/Output this shift: No intake/output data recorded.  General appearance: cooperative and appears stated age Head: Normocephalic, without obvious abnormality, atraumatic Neck: no adenopathy, no carotid bruit, no JVD, supple, symmetrical, trachea midline and thyroid not enlarged, symmetric, no tenderness/mass/nodules Extremities: soft groin bilaterally  Lab Results: Recent Labs    04/07/19 0638 04/08/19 0716  WBC 6.1 6.1  HGB 12.2* 11.5*  HCT 38.3* 36.6*  PLT 117* 106*   BMET Recent Labs    04/07/19 0638 04/08/19 0716  NA 138 137  K 4.0 4.3  CL 104 104  CO2 23 24  GLUCOSE 127* 135*  BUN 31* 37*  CREATININE 1.59* 1.61*  CALCIUM 9.2 9.1    Studies/Results: Ct Head Wo Contrast  Result Date: 04/08/2019 CLINICAL DATA:  63 year old male with new onset confusion. MRI brain yesterday revealing multifocal acute left hemisphere infarcts. EXAM: CT HEAD WITHOUT CONTRAST TECHNIQUE: Contiguous axial images were obtained from the base of the skull through the vertex without intravenous contrast. COMPARISON:  Brain MRI 04/07/2019. Head CT 04/06/2019, and earlier. FINDINGS: Brain: No midline shift, mass effect, or evidence of intracranial mass lesion. No acute intracranial hemorrhage identified. No ventriculomegaly. Small areas of patchy hypodensity corresponding to the watershed type restricted diffusion seen by MRI yesterday. Gray-white matter differentiation elsewhere is stable. No  new cortically based infarct identified. Vascular: Calcified atherosclerosis at the skull base. No suspicious intracranial vascular hyperdensity. Skull: Negative. Sinuses/Orbits: Visualized paranasal sinuses and mastoids are stable and well pneumatized. Other: Visualized orbits and scalp soft tissues are within normal limits. IMPRESSION: 1. Expected CT appearance of the scattered small left hemisphere infarcts seen by MRI yesterday. No associated hemorrhage or mass effect. 2. No new intracranial abnormality. Electronically Signed   By: Genevie Ann M.D.   On: 04/08/2019 14:00   Ct Head Wo Contrast  Result Date: 04/06/2019 CLINICAL DATA:  Subacute neuro deficits. Suspected stroke. History of atrial fibrillation, heart failure, diabetes and hypertension. EXAM: CT HEAD WITHOUT CONTRAST TECHNIQUE: Contiguous axial images were obtained from the base of the skull through the vertex without intravenous contrast. COMPARISON:  CT head 04/04/2019 and 03/04/2019. FINDINGS: Brain: There is no evidence of acute intracranial hemorrhage, mass lesion, brain edema or extra-axial fluid collection. The ventricles and subarachnoid spaces are appropriately sized for age. The ill-defined low-density in the left frontal periventricular white matter is not significantly changed over the last 36 hours. No cortical abnormality identified. Vascular: Intracranial vascular calcifications. No hyperdense vessel identified. Skull: Negative for fracture or focal lesion. Sinuses/Orbits: The visualized paranasal sinuses and mastoid air cells are clear. No orbital abnormalities are seen. Other: Stable deformities of the medial walls of both orbits. IMPRESSION: 1. Unchanged appearance of probable subacute nonhemorrhagic stroke in the left frontal periventricular white matter compared with prior study of 04/04/2019. 2. No evidence of acute intracranial hemorrhage or mass effect. Electronically Signed   By: Richardean Sale M.D.   On: 04/06/2019 09:34    Ct Head Wo Contrast  Result Date: 04/04/2019 CLINICAL DATA:  Weakness  and confusion EXAM: CT HEAD WITHOUT CONTRAST TECHNIQUE: Contiguous axial images were obtained from the base of the skull through the vertex without intravenous contrast. COMPARISON:  CT 03/04/2019, MRI 06/14/2018 FINDINGS: Brain: Focal hypodensity within the left frontal white matter, new since 03/04/2019 comparison head CT. No hemorrhage. No mass. Mild atrophy and small vessel ischemic changes of the white matter. Stable ventricle size Vascular: No hyperdense vessels.  Carotid vascular calcification Skull: Normal. Negative for fracture or focal lesion. Sinuses/Orbits: Old fractures of the medial walls of both orbits. Other: None IMPRESSION: 1. Focal hypodensity within the left frontal white matter, suspicious for acute to subacute infarct. Negative for hemorrhage. 2. Mild atrophy and small vessel ischemic changes of the white matter. Electronically Signed   By: Jasmine PangKim  Fujinaga M.D.   On: 04/04/2019 19:27   Ct Angio Neck W Or Wo Contrast  Result Date: 04/07/2019 CLINICAL DATA:  Right-sided weakness, known left ICA stenosis; carotid stenosis, known, follow-up. EXAM: CT ANGIOGRAPHY NECK TECHNIQUE: Multidetector CT imaging of the neck was performed using the standard protocol during bolus administration of intravenous contrast. Multiplanar CT image reconstructions and MIPs were obtained to evaluate the vascular anatomy. Carotid stenosis measurements (when applicable) are obtained utilizing NASCET criteria, using the distal internal carotid diameter as the denominator. CONTRAST:  75mL OMNIPAQUE IOHEXOL 350 MG/ML SOLN COMPARISON:  CT angiogram head/neck 06/13/2018 FINDINGS: Aortic arch: Standard branching. Imaged portions of the aortic arch demonstrate no evidence of dissection or aneurysm. Soft and calcified plaque within the visualized aortic arch and proximal major branch vessels of the neck. Right carotid system: Scattered soft and  calcified plaque within the CCA. Soft plaque within the distal CCA with resultant 50% stenosis. Again demonstrated is soft and calcified plaque at the carotid bifurcation and within the proximal ICA with resultant 60% stenosis of the proximal ICA. Distal to this, the ICA is patent within the neck without significant stenosis. Heavily calcified partially visualized right internal carotid artery siphon. Sites of at least moderate stenosis within the visualized cavernous ICA (measured at 50-75% on prior CTA head 06/13/2018). Left carotid system: Scattered soft and calcified plaque within the common carotid artery without significant stenosis. Severe soft and calcified plaque at the bifurcation and within the proximal ICA. There is either near complete occlusion of the proximal left ICA or complete occlusion with immediate reconstitution. New from prior examination, the mid to distal cervical ICA is diminutive in caliber with asymmetrically decreased enhancement. Diminished and irregular opacification is seen within the mid to distal cervical ICA and within the visualized ICA siphon Vertebral arteries: The origin of the right vertebral artery appears patent without significant stenosis, although streak artifact from a dense right-sided contrast bolus somewhat limits evaluation. Distal to this, scattered soft and calcified plaque within the cervical right vertebral artery with regions of mild-to-moderate stenosis (greatest at approximately the C5 level, series 7, image 164). Calcified plaque within the intracranial right vertebral artery without significant stenosis. The right vertebral artery is slightly dominant. Calcified plaque within the V1 left vertebral artery with moderate focal stenosis. Distal to this, there is additional scattered soft and calcified plaque within the cervical left vertebral artery with additional sites of mild to moderate stenosis. Calcified plaque within the intracranial left vertebral artery  with sites of moderate/severe stenosis. Skeleton: Reversal of the expected cervical lordosis. Moderate/severe cervical spondylosis with multilevel posterior disc osteophytes, uncovertebral and facet hypertrophy. Poor dentition with numerous absent teeth. Other neck: No soft tissue neck mass or pathologically enlarged cervical chain lymph  nodes. Upper chest: No consolidation within the imaged lung apices. Left internal carotid artery findings called by telephone at the time of interpretation on 04/07/2019 at 2:25 pm to provider Dr. Hilda Lias, who verbally acknowledged these results. IMPRESSION: 1. Prominent atherosclerotic disease within the visualized aortic arch and major branch vessels of the neck, most notably as follows. 2. Severe soft and calcified plaque within the left carotid bifurcation and proximal ICA. Near complete occlusion versus complete occlusion with immediate reconstitution within the proximal left ICA. New from prior CTA in 2019, the mid to distal cervical left ICA is diminutive in caliber with asymmetrically decreased enhancement. Diminished and irregular opacification is also seen within the visualized left carotid artery siphon. Correlate with findings on carotid artery duplex performed 04/05/2019. 3. Estimated 60% stenosis of the proximal right ICA. There is also an estimated 50% stenosis within the distal right common carotid artery. 4. Heavily calcified right internal carotid artery siphon with regions of at least moderate stenosis in the cavernous right ICA. 5. Soft and calcified plaque within the bilateral vertebral arteries. Moderate focal stenosis within the V1 left vertebral artery. Additional sites of mild/moderate stenosis within the bilateral cervical vertebral arteries. 6. Calcified plaque within the non-dominant intracranial left vertebral artery with sites of moderate/severe stenosis. Electronically Signed   By: Jackey Loge   On: 04/07/2019 14:25   Mr Brain Wo  Contrast  Result Date: 04/07/2019 CLINICAL DATA:  Focal neuro deficit, greater than 6 hours, stroke suspected, right-sided weakness and positive stroke on CT scan. EXAM: MRI HEAD WITHOUT CONTRAST TECHNIQUE: Multiplanar, multiecho pulse sequences of the brain and surrounding structures were obtained without intravenous contrast. COMPARISON:  CT angiogram neck 04/07/2019, noncontrast head CT 04/06/2019, brain MRI 06/14/2018 FINDINGS: Brain: There is a 1.7 x 1.1 cm focus of restricted diffusion within the left frontal lobe subcortical white matter consistent with acute infarct. There are numerous additional punctate and small acute infarcts within the left cerebral hemisphere which are predominantly within the left MCA/ACA and MCA/PCA watershed territories, involving the cortex as well as subcortical and deep periventricular white matter. Additional 3 mm acute infarct within the anterior left lentiform nucleus. Curvilinear cortical/subcortical acute infarct measuring 2.0 x 0.4 cm within the left occipital lobe PCA vascular territory (series 2, image 24). There is an additional punctate acute cortical/subcortical infarct within the right occipital lobe PCA vascular territory (series 2, image 22). Corresponding T2/FLAIR hyperintensity at some of the sites. Moderate scattered and confluent T2/FLAIR hyperintensity within the cerebral white matter and pons, consistent with chronic small vessel ischemic disease and progressed from prior MRI 06/14/2018. No midline shift or extra-axial fluid collection. No evidence of intracranial mass. No chronic intracranial blood products. Vascular: Please refer to CT angiogram neck performed earlier the same day for a description of diminished and irregular enhancement within the left internal carotid artery siphon. Otherwise, no definite loss of flow voids proximally. Skull and upper cervical spine: No focal marrow lesion. Sinuses/Orbits: Visualized orbits demonstrate no acute  abnormality. Mild scattered paranasal sinus mucosal thickening. No significant mastoid effusion. These results were called by telephone at the time of interpretation on 04/07/2019 at 4:03 pm to provider [facility] , who verbally acknowledged these results. IMPRESSION: 1. 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 These infarcts are predominantly within the MCA/ACA and MCA/PCA watershed territories. Additional punctate acute infarct within the anterior left lentiform nucleus. 2. Small acute infarcts also present within  the bilateral occipital lobes within the PCA vascular territories. 3. Moderate chronic small vessel ischemic disease, progressed as compared to prior MRI 06/14/2018. Electronically Signed   By: Jackey Loge   On: 04/07/2019 16:03   Ct Abdomen Pelvis W Contrast  Result Date: 04/06/2019 CLINICAL DATA:  Nausea and vomiting EXAM: CT ABDOMEN AND PELVIS WITH CONTRAST TECHNIQUE: Multidetector CT imaging of the abdomen and pelvis was performed using the standard protocol following bolus administration of intravenous contrast. CONTRAST:  OMNIPAQUE IOHEXOL 300 MG/ML  SOLN COMPARISON:  07/25/2018 FINDINGS: Lower chest: Mild cardiomegaly. Coronary atherosclerotic calcification. Hepatobiliary: Early phase imaging is essentially noncontrast. There is mosaic enhancement of the liver on the delayed phase with patchy low-density likely reflecting steatosis. There is borderline surface lobulation and the caudate lobe is large. No focal masslike area. Early hepatic venous reflux usually from elevated right heart pressure. No evidence of biliary obstruction or stone. Pancreas: Unremarkable. Spleen: Unremarkable. Adrenals/Urinary Tract: Negative adrenals. Asymmetric left renal atrophy that has developed since prior. No hydronephrosis or urinary stone. Unremarkable bladder. Stomach/Bowel: No obstruction. No  evidence of bowel inflammation. Appendectomy. Vascular/Lymphatic: Extensive atherosclerotic calcification of the aorta, iliacs, and visceral branches. Bilateral superficial femoral artery occlusion and collapse. no mass or adenopathy. Reproductive:No pathologic findings. Other: No ascites or pneumoperitoneum. Fatty right paramedian umbilical hernia. Musculoskeletal: No acute abnormalities. IMPRESSION: 1. No acute finding. 2. Heterogeneous liver density on the delayed phase, favor either patchy fat infiltration or passive congestion. There are equivocal findings which could reflect cirrhosis, please correlate for risk factors. 3. Left renal atrophy since January 2020, possible interval renal artery compromise. 4. Fatty periumbilical hernia. 5. 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 Electronically Signed   By: Marnee Spring M.D.   On: 04/06/2019 11:29   US Carotid Bilateral (at [facility] Only)  Result Date: 04/06/2019 CLINICAL DATA:  63 year old male with a history of stroke EXAM: BILATERAL CAROTID DUPLEX ULTRASOUND TECHNIQUE: Wallace Cullens scale imaging, color Doppler and duplex ultrasound were performed of bilateral carotid and vertebral arteries in the neck. COMPARISON:  None. FINDINGS: Criteria: Quantification of carotid stenosis is based on velocity parameters that correlate the residual internal carotid diameter with NASCET-based stenosis levels, using the diameter of the distal internal carotid lumen as the denominator for stenosis measurement. The following velocity measurements were obtained: RIGHT ICA:  Systolic 109 cm/sec, Diastolic 27 cm/sec CCA:  96 cm/sec SYSTOLIC ICA/CCA RATIO:  1.1 ECA:  73 cm/sec LEFT ICA: Occluded CCA:  36 cm/sec SYSTOLIC ICA/CCA RATIO:  Not applicable ECA:  140 cm/sec Right Brachial SBP: Not acquired Left Brachial SBP: Not acquired RIGHT CAROTID ARTERY: No significant calcifications of the right common carotid artery. Intermediate waveform maintained.  Heterogeneous and partially calcified plaque at the right carotid bifurcation. No significant lumen shadowing. Low resistance waveform of the right ICA. No significant tortuosity. RIGHT VERTEBRAL ARTERY: Antegrade flow with low resistance waveform. LEFT CAROTID ARTERY: No significant calcifications of the left common carotid artery. Intermediate waveform maintained. Heterogeneous and partially calcified plaque at the left carotid bifurcation. Occlusion of the ICA. LEFT VERTEBRAL ARTERY:  Antegrade flow with low resistance waveform. IMPRESSION: Right: Color duplex indicates minimal heterogeneous and calcified plaque, with no hemodynamically significant stenosis by duplex criteria in the extracranial cerebrovascular circulation. Left: Occlusion of the left internal carotid artery. Signed, Yvone Neu. Reyne Dumas, RPVI [facility] Electronically Signed   By: Gilmer Mor D.O.   On: 04/06/2019 07:18   Dg [facility]  Result Date: 04/04/2019 CLINICAL DATA:  Weakness and confusion. EXAM: PORTABLE CHEST 1 VIEW COMPARISON:  Radiographs 04/04/2019 and 03/04/2019. FINDINGS: 1853 hours. There is stable cardiomegaly and aortic atherosclerosis. Chronic interstitial prominence is similar to the study done earlier today and before. There is no superimposed airspace disease, pleural effusion or pneumothorax. The bones appear unchanged. IMPRESSION: Stable examination with cardiomegaly, vascular congestion and chronic interstitial prominence. No new findings. Electronically Signed   By: Carey Bullocks M.D.   On: 04/04/2019 19:23   Dg Chest Portable 1 View  Result Date: 04/04/2019 CLINICAL DATA:  Patient with cough and shortness of breath EXAM: PORTABLE CHEST 1 VIEW COMPARISON:  Chest radiograph 03/04/2019 FINDINGS: Monitoring leads overlie the patient. Stable cardiomegaly. Aortic atherosclerosis. No large area of pulmonary consolidation. Similar mild interstitial  opacities. No pleural effusion or pneumothorax. Thoracic spine degenerative changes. IMPRESSION: Cardiomegaly. Similar mild interstitial opacities may represent mild edema Electronically Signed   By: Annia Belt M.D.   On: 04/04/2019 08:10   Anti-infectives: Anti-infectives (From admission, onward)   Start     Dose/Rate Route Frequency Ordered Stop   04/08/19 1915  ceFAZolin (ANCEF) IVPB 2g/100 mL premix    Note to Pharmacy: To be given in specials   2 g 200 mL/hr over 30 Minutes Intravenous  Once 04/08/19 1903     04/08/19 0000  ceFAZolin (ANCEF) IVPB 2g/100 mL premix    Note to Pharmacy: To be given in specials   2 g 200 mL/hr over 30 Minutes Intravenous  Once 04/07/19 2218 04/08/19 1803      Assessment/Plan: s/p Procedure(s): CAROTID ANGIOGRAPHY (Bilateral) patient underwent carotid angiogram yesterday. Found to have a string sign which will require revascularization. He is to remain in the hospital on heparin given his previous noncompliance with follow up. He will be scheduled for surgery this coming week.   LOS: 5 days   Leonides Sake 04/09/2019, 10:53 AM

## 2019-04-09 NOTE — Consult Note (Signed)
Cardiology Consultation:   Patient ID: [name] MRN: [id number]; DOB: [birth year]  Admit date: 04/04/2019 Date of Consult: 04/10/2019  Primary Care Provider: [facility] Primary Cardiologist: Julien Nordmann, MD    Patient Profile:   [name] is a 63 y.o. male with a hx ofCAD W prior stenting, known afib/flutter and cardiomyopathy with prior stroke who is being seen today for the evaluation of preop eval at the request of *Dr Enid Baas  Presented with an acute CVA with right upper extremity weakness.  MR I imaging demonstrated multiple acute infarcts within the left cerebral hemisphere.  CT of the neck showed a soft calcified plaque with near occlusion of left carotid..  Angiography confirms and he is anticipated for surgical endarterectomy.  Known coronary artery disease with apparent stenting 2017 and perhaps previously with catheterization 2018 demonstrated patent RCA and PDA and circumflex stents.  Echo 10/20 EF 30-35%.  Recurrent atrial fibrillation/flutter.  Anticoagulated with Xarelto and treated with amiodarone-- this appears to have been INADVERTENTLY stopped at discharge 7/20  ( was o inpt MAR and not mentioned in dc summary   Thromboembolic risk factors (, HTN-1, TIA/CVA-2, DM-1, Vasc disease -1, CHF-1) for a CHADSVASc Score of >=6  History of Present Illness:   [name]  Admitted with recurrent R sided weakness with previously known and now worse L carotid stenosis For endarterectomy scheduled on Tues I am unable to get much history-- he is cheyne-stokes breathing and he is able to talk only for the little bit of time that he is breathing deeply  He says his breathing is worse over recent months, but denies exertional chest pain-- c/o nocturnal dyspnea and edema, although currently without.     He says he is struggling with dyspnea   Last pm with increasing sob>> pO2 recorded as 40 but O2 Sats today in low 90s' ?? Venous gas CXR reviewed  " worsening  heart failure"   Has been maintained on IV lasix, Cr 1.6>>2.0   Review of notes Cardiology 4/20, ER 9/20 >> presentations with lethargy, 7/20 hospitalized with CHF  Heart Pathway Score:     Past Medical History:  Diagnosis Date   CAD in native artery    a. stress echo 12/2007 abnl, EF > 55%, b. LHC 01/28/08: mLAD 30, D1 40, dLCx 70, pRCA 30, mRCA 70, mRCA lesion 2 80, PDA 90, s/p PCI/BMS to prox and distal RCA, s/p PCI/BMS to PDA; c. patient reports PCI/stenting x 2 in early 2017 at the [place] (no records on file) d. 06/2016: cath showing patent stents along RCA and LCx with moderate 40% stenosis along the LAD.    Carotid arterial disease (HCC)    a. 05/2018 CTA Head/neck: 80-90% stenosis @ L carotid bifurcation, extending into the prox LICA. 60% stenosis @ R carotid bifurcation. 50-75% bilateral distal cavernous carotid dzs.   Chronic combined systolic (congestive) and diastolic (congestive) heart failure (HCC)    a. 2009 Echo: > 55%; b. 06/2016: EF 25-30%; c. 12/2017 Echo: EF 30-35%, diff HK; d. 05/2018 Echo: 30-35%, mild conc LVH, diff HK. Mild MR. Mildly dil LA/RA.   Chronic kidney disease    COPD (chronic obstructive pulmonary disease) (HCC)    Diabetes mellitus without complication (HCC)    Gout    Hyperlipidemia    Hypertension    Hypertensive heart disease    Ischemic cardiomyopathy    a. 05/2018: 30-35%.   Obesity    Persistent atrial fibrillation (HCC) 01/2017   a. cardioverted  9/18 to NSR; b. 12/2017 noted to be back in Afib-outpt dccv rec; c. CHA2DS2VASc = 6-->Xarelto.   Stroke (cerebrum) (HCC)    a. 05/2018 MRI: small patchy acute to subacute cortial and white matter infarct in the bilat cerebrum. Mild chronic small vessel ischmia; b. 05/2018 Carotid U/S: L-carotid bifurcation 80-90% stenosed, R- carotid bifurcation 60% stenosed.   Tobacco abuse     Past Surgical History:  Procedure Laterality Date   CARDIAC CATHETERIZATION  2009   [facility];    CARDIAC  CATHETERIZATION  2010   CARDIAC CATHETERIZATION N/A 07/28/2016   Procedure: Right and Left Heart Cath and possible PCI;  Surgeon: Iran Ouch, MD;  Location: [facility] INVASIVE CV LAB;  Service: Cardiovascular;  Laterality: N/A;   CARDIOVERSION N/A 03/27/2017   Procedure: CARDIOVERSION;  Surgeon: Iran Ouch, MD;  Location: [facility] ORS;  Service: Cardiovascular;  Laterality: N/A;   CARDIOVERSION N/A 07/30/2018   Procedure: CARDIOVERSION;  Surgeon: Antonieta Iba, MD;  Location: [facility] ORS;  Service: Cardiovascular;  Laterality: N/A;   CORONARY ANGIOPLASTY  2009   s/p stent placement at [facility].       Inpatient Medications: Scheduled Meds:  atorvastatin  80 mg Oral Daily   budesonide (PULMICORT) nebulizer solution  0.25 mg Nebulization BID   carvedilol  6.25 mg Oral BID WC   diltiazem  120 mg Oral Daily   furosemide  40 mg Intravenous Q12H   guaiFENesin  600 mg Oral BID   insulin aspart  0-5 Units Subcutaneous QHS   insulin aspart  0-9 Units Subcutaneous TID WC   insulin glargine  10 Units Subcutaneous QHS   magnesium oxide  400 mg Oral BID   methocarbamol  500 mg Oral QID   polyethylene glycol  17 g Oral Daily   senna-docusate  2 tablet Oral Daily   tiotropium  18 mcg Inhalation Daily   Continuous Infusions:  sodium chloride 250 mL (04/10/19 0714)    ceFAZolin (ANCEF) IV     heparin     levETIRAcetam 500 mg (04/10/19 0715)   PRN Meds: sodium chloride, bisacodyl, hydrALAZINE, HYDROmorphone (DILAUDID) injection, ipratropium-albuterol, morphine injection, nitroGLYCERIN, ondansetron (ZOFRAN) IV, ondansetron (ZOFRAN) IV, oxyCODONE, simethicone  Allergies:    Allergies  Allergen Reactions   Aspirin Other (See Comments)    Told not to take because of blood thinner   Entresto [Sacubitril-Valsartan] Other (See Comments)    Dizziness, weakness and stuttering   Ibuprofen Nausea And Vomiting   Tylenol [Acetaminophen] Nausea And Vomiting    Social History:    Social History   Socioeconomic History   Marital status: Married    Spouse name: Not on file   Number of children: Not on file   Years of education: Not on file   Highest education level: Not on file  Occupational History   Not on file  Social Needs   Financial resource strain: Patient refused   Food insecurity    Worry: Patient refused    Inability: Patient refused   Transportation needs    Medical: Patient refused    Non-medical: Patient refused  Tobacco Use   Smoking status: Former Smoker    Packs/day: 1.00    Years: 35.00    Pack years: 35.00    Types: Cigarettes   Smokeless tobacco: Never Used  Substance and Sexual Activity   Alcohol use: Not Currently    Comment: Quit 8 yrs.  Used to drink heavily   Drug use: Yes    Comment: prescribed oxy  Sexual activity: Not on file  Lifestyle   Physical activity    Days per week: Patient refused    Minutes per session: Patient refused   Stress: Patient refused  Relationships   Social connections    Talks on phone: Patient refused    Gets together: Patient refused    Attends religious service: Patient refused    Active member of club or organization: Patient refused    Attends meetings of clubs or organizations: Patient refused    Relationship status: Patient refused   Intimate partner violence    Fear of current or ex partner: Patient refused    Emotionally abused: Patient refused    Physically abused: Patient refused    Forced sexual activity: Patient refused  Other Topics Concern   Not on file  Social History Narrative   Lives locally with wife.  Does not routinely exercise.    Family History:     Family History  Problem Relation Age of Onset   Heart attack Mother 46   Hypertension Mother    Cancer Brother      ROS:  Please see the history of present illness.    All other ROS reviewed and negative.     Physical Exam/Data:   Vitals:   04/10/19 0428 04/10/19 0731 04/10/19 1009  04/10/19 1227  BP: (!) 118/92  (!) 135/100 132/90  Pulse: (!) 111  (!) 114 88  Resp: 16  14 16   Temp: 99.6 F (37.6 C)  97.6 F (36.4 C)   TempSrc: Rectal  Oral   SpO2: 98% 93% 97% 96%  Weight:      Height:        Intake/Output Summary (Last 24 hours) at 04/10/2019 1422 Last data filed at 04/10/2019 1300 Gross per 24 hour  Intake 854.16 ml  Output 700 ml  Net 154.16 ml   Last 3 Weights 04/05/2019 04/04/2019 03/14/2019  Weight (lbs) 220 lb 0.3 oz 220 lb 215 lb  Weight (kg) 99.8 kg 99.791 kg 97.523 kg  Some encounter information is confidential and restricted. Go to Review Flowsheets activity to see all data.     Body mass index is 30.69 kg/m.  General:  Well nourished, well developed,w cheyne stokes respiration  Intermittently responsive to questions and othertimes not-- observed over about 3  Minutes with aobut a one minute periodicity HEENT: normal Lymph: no adenopathy Neck: = 6-7 cm Vascular: carotid bruits not auscultated ;  Cardiac:  IRRR with 2/6 m Lungs:  clear to auscultation laterally,  Abd: soft, nontender, no hepatomegaly  Ext: no edema Musculoskeletal:  No deformities, BUE and BLE strength normal and equal Skin: warm and dry  Neuro:  CNs 2-12 intact, no focal abnormalities noted Psych:  Sleepy   EKG:  The EKG was personally reviewed and demonstrates:  Atrial flutter with variable block  Telemetry:  Telemetry was personally reviewed and demonstrates: afl with variable block   Relevant CV Studies: As above   Laboratory Data:  High Sensitivity Troponin:   Recent Labs  Lab 04/04/19 2041 04/06/19 1655 04/06/19 1928 04/10/19 0459 04/10/19 1250  TROPONINIHS 89* 56* 60* 145* 131*     Chemistry Recent Labs  Lab 04/08/19 0716 04/09/19 2304 04/10/19 0459  NA 137 138 139  K 4.3 5.2* 4.7  CL 104 108 109  CO2 24 18* 20*  GLUCOSE 135* 134* 121*  BUN 37* 38* 39*  CREATININE 1.61* 2.01* 2.02*  CALCIUM 9.1 9.1 9.0  GFRNONAA 45* 35* 34*  GFRAA 52*  40*  40*  ANIONGAP Recent Labs  Lab 04/04/19 2041  PROT 8.5*  ALBUMIN 4.2  AST 30  ALT 35  ALKPHOS 209*  BILITOT 1.8*   Hematology Recent Labs  Lab 04/08/19 0716 04/09/19 2304 04/10/19 0459  WBC 6.1 6.5 5.9  RBC 3.97* 4.20* 4.15*  HGB 11.5* 12.2* 12.1*  HCT 36.6* 40.1 38.7*  MCV 92.2 95.5 93.3  MCH 29.0 29.0 29.2  MCHC 31.4 30.4 31.3  RDW 19.7* 21.2* 20.9*  PLT 106* 150 129*   BNP Recent Labs  Lab 04/04/19 0748 04/10/19 0459  BNP 1,358.0* 1,018.0*    DDimer No results for input(s): DDIMER in the last 168 hours.   Radiology/Studies:  Dg Chest 1 View  Result Date: 04/09/2019 CLINICAL DATA:  Shortness of breath EXAM: CHEST  1 VIEW COMPARISON:  04/04/2019 FINDINGS: There is persistent cardiomegaly with worsening pulmonary edema and interstitial lung markings. No pneumothorax. There are probable small bilateral pleural effusions, right greater than left. IMPRESSION: Worsening congestive heart failure. Electronically Signed   By: Katherine Mantle M.D.   On: 04/09/2019 22:35   Dg Chest 1 View  Result Date: 04/09/2019 CLINICAL DATA:  Right side weakness. EXAM: CHEST  1 VIEW COMPARISON:  Single-view of the chest 04/04/2019. FINDINGS: There is cardiomegaly and pulmonary edema which is new since yesterday's study. No pneumothorax or pleural fluid. Atherosclerosis noted. IMPRESSION: New pulmonary edema. Cardiomegaly. Atherosclerosis. Electronically Signed   By: Drusilla Kanner M.D.   On: 04/09/2019 13:01   Ct Head Wo Contrast  Result Date: 04/09/2019 CLINICAL DATA:  Encephalopathy EXAM: CT HEAD WITHOUT CONTRAST TECHNIQUE: Contiguous axial images were obtained from the base of the skull through the vertex without intravenous contrast. COMPARISON:  04/08/2019 FINDINGS: Brain: There is no mass, hemorrhage or extra-axial collection. The size and configuration of the ventricles and extra-axial CSF spaces are normal. There is hypoattenuation of the white matter, most  commonly indicating chronic small vessel disease. Multifocal hypoattenuation in the left hemispheric white matter consistent with known areas of infarct. Vascular: No abnormal hyperdensity of the major intracranial arteries or dural venous sinuses. No intracranial atherosclerosis. Skull: The visualized skull base, calvarium and extracranial soft tissues are normal. Sinuses/Orbits: No fluid levels or advanced mucosal thickening of the visualized paranasal sinuses. No mastoid or middle ear effusion. The orbits are normal. IMPRESSION: Chronic ischemic microangiopathy without acute intracranial abnormality. Electronically Signed   By: Deatra Robinson M.D.   On: 04/09/2019 22:44   Ct Head Wo Contrast  Result Date: 04/08/2019 CLINICAL DATA:  63 year old male with new onset confusion. MRI brain yesterday revealing multifocal acute left hemisphere infarcts. EXAM: CT HEAD WITHOUT CONTRAST TECHNIQUE: Contiguous axial images were obtained from the base of the skull through the vertex without intravenous contrast. COMPARISON:  Brain MRI 04/07/2019. Head CT 04/06/2019, and earlier. FINDINGS: Brain: No midline shift, mass effect, or evidence of intracranial mass lesion. No acute intracranial hemorrhage identified. No ventriculomegaly. Small areas of patchy hypodensity corresponding to the watershed type restricted diffusion seen by MRI yesterday. Gray-white matter differentiation elsewhere is stable. No new cortically based infarct identified. Vascular: Calcified atherosclerosis at the skull base. No suspicious intracranial vascular hyperdensity. Skull: Negative. Sinuses/Orbits: Visualized paranasal sinuses and mastoids are stable and well pneumatized. Other: Visualized orbits and scalp soft tissues are within normal limits. IMPRESSION: 1. Expected CT appearance of the scattered small left hemisphere infarcts seen by MRI yesterday. No associated hemorrhage or mass effect. 2. No new intracranial abnormality. Electronically  Signed   By: Odessa Fleming M.D.   On: 04/08/2019 14:00   Ct Angio Neck W Or Wo Contrast  Result Date: 04/07/2019 CLINICAL DATA:  Right-sided weakness, known left ICA stenosis; carotid stenosis, known, follow-up. EXAM: CT ANGIOGRAPHY NECK TECHNIQUE: Multidetector CT imaging of the neck was performed using the standard protocol during bolus administration of intravenous contrast. Multiplanar CT image reconstructions and MIPs were obtained to evaluate the vascular anatomy. Carotid stenosis measurements (when applicable) are obtained utilizing NASCET criteria, using the distal internal carotid diameter as the denominator. CONTRAST:  75mL OMNIPAQUE IOHEXOL 350 MG/ML SOLN COMPARISON:  CT angiogram head/neck 06/13/2018 FINDINGS: Aortic arch: Standard branching. Imaged portions of the aortic arch demonstrate no evidence of dissection or aneurysm. Soft and calcified plaque within the visualized aortic arch and proximal major branch vessels of the neck. Right carotid system: Scattered soft and calcified plaque within the CCA. Soft plaque within the distal CCA with resultant 50% stenosis. Again demonstrated is soft and calcified plaque at the carotid bifurcation and within the proximal ICA with resultant 60% stenosis of the proximal ICA. Distal to this, the ICA is patent within the neck without significant stenosis. Heavily calcified partially visualized right internal carotid artery siphon. Sites of at least moderate stenosis within the visualized cavernous ICA (measured at 50-75% on prior CTA head 06/13/2018). Left carotid system: Scattered soft and calcified plaque within the common carotid artery without significant stenosis. Severe soft and calcified plaque at the bifurcation and within the proximal ICA. There is either near complete occlusion of the proximal left ICA or complete occlusion with immediate reconstitution. New from prior examination, the mid to distal cervical ICA is diminutive in caliber with asymmetrically  decreased enhancement. Diminished and irregular opacification is seen within the mid to distal cervical ICA and within the visualized ICA siphon Vertebral arteries: The origin of the right vertebral artery appears patent without significant stenosis, although streak artifact from a dense right-sided contrast bolus somewhat limits evaluation. Distal to this, scattered soft and calcified plaque within the cervical right vertebral artery with regions of mild-to-moderate stenosis (greatest at approximately the C5 level, series 7, image 164). Calcified plaque within the intracranial right vertebral artery without significant stenosis. The right vertebral artery is slightly dominant. Calcified plaque within the V1 left vertebral artery with moderate focal stenosis. Distal to this, there is additional scattered soft and calcified plaque within the cervical left vertebral artery with additional sites of mild to moderate stenosis. Calcified plaque within the intracranial left vertebral artery with sites of moderate/severe stenosis. Skeleton: Reversal of the expected cervical lordosis. Moderate/severe cervical spondylosis with multilevel posterior disc osteophytes, uncovertebral and facet hypertrophy. Poor dentition with numerous absent teeth. Other neck: No soft tissue neck mass or pathologically enlarged cervical chain lymph nodes. Upper chest: No consolidation within the imaged lung apices. Left internal carotid artery findings called by telephone at the time of interpretation on 04/07/2019 at 2:25 pm to provider Dr. Hilda Lias, who verbally acknowledged these results. IMPRESSION: 1. Prominent atherosclerotic disease within the visualized aortic arch and major branch vessels of the neck, most notably as follows. 2. Severe soft and calcified plaque within the left carotid bifurcation and proximal ICA. Near complete occlusion versus complete occlusion with immediate reconstitution within the proximal left ICA. New from  prior CTA in 2019, the mid to distal cervical left ICA is diminutive in caliber with asymmetrically decreased enhancement. Diminished and irregular opacification is also seen within the visualized left carotid artery siphon. Correlate with findings  on carotid artery duplex performed 04/05/2019. 3. Estimated 60% stenosis of the proximal right ICA. There is also an estimated 50% stenosis within the distal right common carotid artery. 4. Heavily calcified right internal carotid artery siphon with regions of at least moderate stenosis in the cavernous right ICA. 5. Soft and calcified plaque within the bilateral vertebral arteries. Moderate focal stenosis within the V1 left vertebral artery. Additional sites of mild/moderate stenosis within the bilateral cervical vertebral arteries. 6. Calcified plaque within the non-dominant intracranial left vertebral artery with sites of moderate/severe stenosis. Electronically Signed   By: Jackey LogeKyle  Golden   On: 04/07/2019 14:25   Mr Brain Wo Contrast  Result Date: 04/07/2019 CLINICAL DATA:  Focal neuro deficit, greater than 6 hours, stroke suspected, right-sided weakness and positive stroke on CT scan. EXAM: MRI HEAD WITHOUT CONTRAST TECHNIQUE: Multiplanar, multiecho pulse sequences of the brain and surrounding structures were obtained without intravenous contrast. COMPARISON:  CT angiogram neck 04/07/2019, noncontrast head CT 04/06/2019, brain MRI 06/14/2018 FINDINGS: Brain: There is a 1.7 x 1.1 cm focus of restricted diffusion within the left frontal lobe subcortical white matter consistent with acute infarct. There are numerous additional punctate and small acute infarcts within the left cerebral hemisphere which are predominantly within the left MCA/ACA and MCA/PCA watershed territories, involving the cortex as well as subcortical and deep periventricular white matter. Additional 3 mm acute infarct within the anterior left lentiform nucleus. Curvilinear cortical/subcortical  acute infarct measuring 2.0 x 0.4 cm within the left occipital lobe PCA vascular territory (series 2, image 24). There is an additional punctate acute cortical/subcortical infarct within the right occipital lobe PCA vascular territory (series 2, image 22). Corresponding T2/FLAIR hyperintensity at some of the sites. Moderate scattered and confluent T2/FLAIR hyperintensity within the cerebral white matter and pons, consistent with chronic small vessel ischemic disease and progressed from prior MRI 06/14/2018. No midline shift or extra-axial fluid collection. No evidence of intracranial mass. No chronic intracranial blood products. Vascular: Please refer to CT angiogram neck performed earlier the same day for a description of diminished and irregular enhancement within the left internal carotid artery siphon. Otherwise, no definite loss of flow voids proximally. Skull and upper cervical spine: No focal marrow lesion. Sinuses/Orbits: Visualized orbits demonstrate no acute abnormality. Mild scattered paranasal sinus mucosal thickening. No significant mastoid effusion. These results were called by telephone at the time of interpretation on 04/07/2019 at 4:03 pm to provider [facility] , who verbally acknowledged these results. IMPRESSION: 1. 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 These infarcts are predominantly within the MCA/ACA and MCA/PCA watershed territories. Additional punctate acute infarct within the anterior left lentiform nucleus. 2. Small acute infarcts also present within the bilateral occipital lobes within the PCA vascular territories. 3. Moderate chronic small vessel ischemic disease, progressed as compared to prior MRI 06/14/2018. Electronically Signed   By: Jackey LogeKyle  Golden   On: 04/07/2019 16:03   Chest Xray personally reviewed  Worsening congestive haert failure since 10/5  Assessment and Plan:    1. Preoperative Assessment 2. Carotid stenosis with pending endarectomy 3. CHF a/c systolic/diastolic 4. Atrial flutter -- recurrent/persistent  Previously maintaining sinus on amiodarone 5. Ischemic cardiomyopathy with prior PCI  6. Renal insufficiency Cr 1.6>>2.0  7. Cheyne-stokes respiration   It is difficult to stratify this mans risk; he describes worsening sob over recent months with an interval hospitalization about 2 m ago at which time myoview scanning  was recommended.  This is probably appropriate; as I have no way to accurately assess functional status with seeming progressive symptoms over recent months.  I have  reviewed with dr Verlin Fester the risk of systemic hypotension vis a vis maintaining of cerebral perfusion --she assures me that it is very low.  Only a very high risk scan should derail his scheduled surgery, I gather from neuro notes that it is Tuesday.  This is a discussion that can be finalized  tomorrow with Vascular Surgery  His C/S respirations may be manifestation of worsening chf and so would continue his diuresis, albeit gently given his renal issues.  I have spoken with Dr Enid Baas   Amiodarone and anticoagulation and repeat DCCV following CEA would be appropriate   For questions or updates, please contact [facility] Please consult [email or web address] for contact info under     Signed, Sherryl Manges, MD  04/10/2019 2:22 PM

## 2019-04-09 NOTE — Progress Notes (Addendum)
Occupational Therapy Treatment Patient Details Name: [name] MRN: [id number] DOB: 07-Dec-1955 Today's Date: 04/09/2019    History of present illness 63 yo male presented to ED 10/5 with dysarthria and mild R sided weakness, pt treated earlier in the day for [facility] and CHF, CT scan shows left frontal infarct. PMH includes CAD, CKD, COPD, DM, HLD, HTN, persistent Afib, stroke 05/2018   OT comments  Pt progressing toward stated goals, fatigued this date with decreased alertness without cueing. Focused session on BADL mobility progression and facilitation of RUE. No active movement of RUE this date noted. Pt max A for supine <> sit this date. Attempted stand pivot transfer to chair for lunch ,but pt fatigued and with little effort so further transfer deferred. Facilitated RUE weightbearing and functional PNF patterns. Once back in bed, pt total A to roll to R hemi side and max A to L for straightening out bed pads. Placed chair in bed/chair position for lunch. Continue to recommend CIR for continued intensive neuro-rehabilitation to facilitate PLOF. Will continue to follow.    Follow Up Recommendations  CIR;Supervision/Assistance - 24 hour    Equipment Recommendations  Other (comment)(defer to next venue)    Recommendations for Other Services      Precautions / Restrictions Precautions Precautions: Fall Restrictions Weight Bearing Restrictions: No       Mobility Bed Mobility Overal bed mobility: Needs Assistance Bed Mobility: Supine to Sit;Sit to Supine;Rolling Rolling: Max assist;Total assist(total to R, max to L)   Supine to sit: Max assist Sit to supine: Max assist   General bed mobility comments: max A for BLE translation to EOB and lifting trunk  Transfers Overall transfer level: Needs assistance Equipment used: Rolling walker (2 wheeled) Transfers: Sit to/from Stand Sit to Stand: Min assist;From elevated surface;+2 safety/equipment         General transfer  comment: attempted with +1 assist at max level, pt with decreased alertness level- deferred further t/f    Balance Overall balance assessment: Needs assistance Sitting-balance support: Feet supported Sitting balance-Leahy Scale: Fair Sitting balance - Comments: steady sitting EOB   Standing balance support: Bilateral upper extremity supported Standing balance-Leahy Scale: Fair Standing balance comment: No LOB in static standing or during side-stepping                           ADL either performed or assessed with clinical judgement   ADL Overall ADL's : Needs assistance/impaired Eating/Feeding: NPO   Grooming: Moderate assistance;Sitting Grooming Details (indicate cue type and reason): one handed, RUE without active movement this date Upper Body Bathing: Moderate assistance;Sitting   Lower Body Bathing: Maximal assistance;Sit to/from stand;Sitting/lateral leans   Upper Body Dressing : Moderate assistance;Sitting   Lower Body Dressing: Maximal assistance;Sit to/from stand;Sitting/lateral leans   Toilet Transfer: Maximal assistance;+2 for physical assistance;Stand-pivot;BSC   Toileting- Clothing Manipulation and Hygiene: Total assistance Toileting - Clothing Manipulation Details (indicate cue type and reason): incontinent     Functional mobility during ADLs: Maximal assistance;+2 for physical assistance;+2 for safety/equipment(EOB and bed level only this date) General ADL Comments: pt ltd from R sided weakness, decreased arousal, and cognitive deficits     Vision   Vision Assessment?: No apparent visual deficits   Perception     Praxis      Cognition Arousal/Alertness: Lethargic Behavior During Therapy: Flat affect Overall Cognitive Status: Impaired/Different from baseline Area of Impairment: Orientation;Attention;Memory;Following commands;Safety/judgement  Orientation Level: Disoriented to;Time;Situation Current Attention Level:  Focused Memory: Decreased short-term memory Following Commands: Follows one step commands with increased time Safety/Judgement: Decreased awareness of deficits     General Comments: pt lethargic this date, needing cues to attend and stay alert. Kept removing O2 and stating "give me air" despite education on wearing O2        Exercises Total Joint Exercises Heel Slides: AAROM;Right;5 reps Hip ABduction/ADduction: AAROM;Right;5 reps Straight Leg Raises: AAROM;Right;5 reps Long Arc Quad: AROM;Both;10 reps Knee Flexion: AROM;Both;10 reps Other Exercises Other Exercises: Multiple sit to/from stands from an elevated EOB   Shoulder Instructions       General Comments      Pertinent Vitals/ Pain       Pain Assessment: Faces Faces Pain Scale: Hurts a little bit Pain Location: all over Pain Descriptors / Indicators: Grimacing;Moaning Pain Intervention(s): Limited activity within patient's tolerance;Monitored during session;Repositioned  Home Living                                          Prior Functioning/Environment              Frequency  Min 3X/week        Progress Toward Goals  OT Goals(current goals can now be found in the care plan section)  Progress towards OT goals: Progressing toward goals  Acute Rehab OT Goals Patient Stated Goal: eat lunch OT Goal Formulation: With patient Time For Goal Achievement: 04/23/19  Plan Discharge plan remains appropriate;Frequency remains appropriate    Co-evaluation                 AM-PAC OT "6 Clicks" Daily Activity     Outcome Measure   Help from another person eating meals?: A Little Help from another person taking care of personal grooming?: A Lot Help from another person toileting, which includes using toliet, bedpan, or urinal?: A Lot Help from another person bathing (including washing, rinsing, drying)?: A Lot Help from another person to put on and taking off regular upper body  clothing?: A Lot Help from another person to put on and taking off regular lower body clothing?: A Lot 6 Click Score: 13    End of Session Equipment Utilized During Treatment: Gait belt  OT Visit Diagnosis: Other abnormalities of gait and mobility (R26.89);Hemiplegia and hemiparesis;Other symptoms and signs involving cognitive function Hemiplegia - Right/Left: Right Hemiplegia - dominant/non-dominant: Dominant Hemiplegia - caused by: Cerebral infarction   Activity Tolerance Patient limited by fatigue   Patient Left in bed;with call bell/phone within reach;with bed alarm set   Nurse Communication Mobility status        Time: 3557-3220 OT Time Calculation (min): 20 min  Charges: OT General Charges $OT Visit: 1 Visit OT Treatments $Self Care/Home Management : 8-22 mins  Zenovia Jarred, MSOT, OTR/L [place] 04/09/2019, 1:03 PM

## 2019-04-09 NOTE — Progress Notes (Signed)
10/10 Events overnight noted. Per nurse notes, BP fluctuating during the shift as low as 80 s. Patient given 1 l bolus. Patient started on Keppra 500 mg q 12 hr. Patient had a cerebral angio on 10/09 with string sign ( greater than 90 % of left ICA.  Neuro exam this AM: Patient lying on bed.  He is alert, awake, speech with slight dysarthria, oriented x3, following commands PERLA, EOMI, no nystagmus, VFF, face with flattening of right naso labial fold (questionable due to body habitus patient dentoulous) face sensation to touch seems symmetrical ( althought difficult to assess), uvula and tongue midline He is 4/5 on LUEX/LLEX/RLEX. He is 3/5 in RUEX and 1/5 in right hand No sensory deficit appreciated althought difficult to assess No coordination deficit appreciated DTR not checked at time of examination Gait not checked this AM.  RECS: - Continue neuro protectives measures including normothermia, normoglycemia, correct electrolytes/metabolic abnlites, treat any infection while admitted - Would not control patient BP very tightly given his atherosclerosis to allow for some brain perfusion. Keep head flat or at least 15 degrees to allow for brain perfusion - Will f/up on EEG - Appreciate CV recs and input. - Continue Xarelto - PT/OT - Discussed the case and plan of care with Primary team   10/09 - Patient states that he had an episode of urinary incontinence  - MRI 10/08: Multiple acute infarcts within the left cerebral hemisphere involving the cortex as well as subcortical and deep periventricular white matter. These infarcts are predominantly within the MCA/ACA and MCA/PCA watershed territories. Additional punctate acute infarct within the anterior left lentiform nucleus.Small acute infarcts also present within the bilateral occipital lobes within the PCA vascular territories.Moderate chronic small vessel ischemic disease, progressed as compared to prior MRI 06/14/2018.  - CTA  10/08: Prominent atherosclerotic disease within the visualized aortic arch and major branch vessels of the neck, most notably as follows.Severe soft and calcified plaque within the left carotid bifurcation and proximal ICA. Near complete occlusion versuscomplete occlusion with immediate reconstitution within the proxima lleft ICA. New from prior CTA in 2019, the mid to distal cervical left ICA is diminutive in caliber with asymmetrically decreased enhancement. Diminished and irregular opacification is also seen within the visualized left carotid artery siphon. Correlate with findings on carotid artery duplex performed 04/05/2019.Estimated 60% stenosis of the proximal right ICA. There is also an estimated 50% stenosis within the distal right common carotid artery.Heavily calcified right internal carotid artery siphon with regions of at least moderate stenosis in the cavernous right ICA.Soft and calcified plaque within the bilateral vertebral arteries. Moderate focal stenosis within the V1 left vertebral artery. Additional sites of mild/moderate stenosis within the bilateral cervical vertebral arteries. Calcified plaque within the non-dominant intracranial left vertebral artery with sites of moderate/severe stenosis.  Neuro exam remains stable. Patient resting comfortably in bed, stating that he is having a procedure today (Per CV notes, he is a having diagnostic cerebral angio) C/o right hand weakness  Neurologic Examination: Alert, awake, following commands, slight dysrathria PERLA, EOMI, no nystagmus, VFF, face seems symmetrical, face sensation tot touch seems symmetrical, uvukla and tongue midline He is 4/5 in all extremities. He is 1/5 in right wrist flexion/extension No sensory deficit appreciated. Difficult to assess No coordination deficit appreciated DTR not checked at time of examination Gait: not checked   RECS: - Continue neuro protectives measures including normothermia,  normoglycemia, correct electrolytes/metabolic abnlites, treat any infection while admitted - Would not control patient BP very tightly  given his atherosclerosis to allow for some brain perfusion - Appreciate CV recs and input. - Continue Xarelto - PT/OT     10/07: No major neuro event overnight Patient had  Ahead ct in AM: when I interviewed the patient, patient was complaining of SOB, denies any new weakness or speech disturbances. Head ct: Unchanged appearance of probable subacute nonhemorrhagic stroke in the left frontal periventricular white matter compared with prior study of 04/04/2019. No evidence of acute intracranial hemorrhage or mass effect.  Patient  refused MRI  Neuro exam remains unchanged and stable Neurologic Examination: Alert, awake, following commands, slight dysrathria PERLA, EOMI, no nystagmus, VFF, face seems symmetrical, face sensation tot touch seems symmetrical, uvukla and tongue midline He is 4/5 in all extremities No sensory deficit appreciated No coordination deficit appreciated DTR not checked at time of examination Gait: Patient. Per pt, patient ambulating from bathroom to his bed  RECS: - Continue neuro protectives measures including normothermia, normoglycemia, correct electrolytes/metabolic abnlites, treat any infection - Would not control patient BP very tightly given his atherosclerosis to allow for some brain perfusion - Consider CV consult for optimization of his treatment regarding carotid occlusion if not candidate for surgery - Continue Xarelto - PT/OT - patient agreeble for MRI. Will f/up   10/06: initial consult Chief Complaint:  Left frontal stroke HPI: [name] is an 63 y.o. male with history of CAD, CHF, HTN, HLD, atrial fibrillation on Xarelto, CVA admitted with right sided weakness and slurred speech.  Patient is a limited historian.  Per  Note, patient seen in this ED earlier on day of admission  for SOB, found to have acute  on chronic exacerbation of his heart failure. Received IV Lasix and discharged with plan to follow up in heart failure clinic. After leaving the emergency department he developed some slurred speech and right-sided weakness.  His home health nurse checked on him and was concerned he may be experiencing a stroke, which prompted him to return to the emergency department. Patient states his symptoms started around 1 PM today.  He is on anticoagulation for his history of atrial fibrillation.  He reports a history of prior strokes with similar symptoms.  He denies any recent illnesses, no vomiting, diarrhea, urinary symptoms Head ct obtained in ER showing: Focal hypodensity within the left frontal white matter,suspicious for acute to subacute infarct. Negative for hemorrhage.Mild atrophy and small vessel ischemic changes of the white matter. Reviewing his chart, patient had an ct angio of neck and head on 05/2018 that showed Severe calcified and noncalcified plaque at the LEFT carotid bifurcation, extending into the proximal ICA. Estimated 80-90% stenosis.Non stenotic atheromatous change RIGHT carotid bifurcation, estimated 60% stenosis.no intracranial flow-limiting stenosis or large vessel occlusion.Significant calcified plaque both distal cavernous carotid arteries, estimated 50-75% stenosis bilaterally.Poor dentition, with numerous missing teeth, dental caries, andperiapical lucencies. Echo on 05/2018 with EF 30-35% with diffuse hypokinesis. Pateitn admitted for further management This AM, patient working with PT, standing and has walked around. States that he is sob  Past Medical History:  Diagnosis Date  . CAD in native artery    a. stress echo 12/2007 abnl, EF > 55%, b. LHC 01/28/08: mLAD 30, D1 40, dLCx 70, pRCA 30, mRCA 70, mRCA lesion 2 80, PDA 90, s/p PCI/BMS to prox and distal RCA, s/p PCI/BMS to PDA; c. patient reports PCI/stenting x 2 in early 2017 at the [place] (no records on file) d. 06/2016: cath  showing patent stents along RCA and LCx with  moderate 40% stenosis along the LAD.   [name] Carotid arterial disease (HCC)    a. 05/2018 CTA Head/neck: 80-90% stenosis @ L carotid bifurcation, extending into the prox LICA. 60% stenosis @ R carotid bifurcation. 50-75% bilateral distal cavernous carotid dzs.  . Chronic combined systolic (congestive) and diastolic (congestive) heart failure (HCC)    a. 2009 Echo: > 55%; b. 06/2016: EF 25-30%; c. 12/2017 Echo: EF 30-35%, diff HK; d. 05/2018 Echo: 30-35%, mild conc LVH, diff HK. Mild MR. Mildly dil LA/RA.  [name] Chronic kidney disease   . COPD (chronic obstructive pulmonary disease) (HCC)   . Diabetes mellitus without complication (HCC)   . Gout   . Hyperlipidemia   . Hypertension   . Hypertensive heart disease   . Ischemic cardiomyopathy    a. 05/2018: 30-35%.  . Obesity   . Persistent atrial fibrillation (HCC) 01/2017   a. cardioverted 9/18 to NSR; b. 12/2017 noted to be back in Afib-outpt dccv rec; c. CHA2DS2VASc = 6-->Xarelto.  . Stroke (cerebrum) (HCC)    a. 05/2018 MRI: small patchy acute to subacute cortial and white matter infarct in the bilat cerebrum. Mild chronic small vessel ischmia; b. 05/2018 Carotid U/S: L-carotid bifurcation 80-90% stenosed, R- carotid bifurcation 60% stenosed.  . Tobacco abuse     Past Surgical History:  Procedure Laterality Date  . CARDIAC CATHETERIZATION  2009   [facility];   . CARDIAC CATHETERIZATION  2010  . CARDIAC CATHETERIZATION N/A 07/28/2016   Procedure: Right and Left Heart Cath and possible PCI;  Surgeon: Iran Ouch, MD;  Location: [facility] INVASIVE CV LAB;  Service: Cardiovascular;  Laterality: N/A;  . CARDIOVERSION N/A 03/27/2017   Procedure: CARDIOVERSION;  Surgeon: Iran Ouch, MD;  Location: [facility] ORS;  Service: Cardiovascular;  Laterality: N/A;  . CARDIOVERSION N/A 07/30/2018   Procedure: CARDIOVERSION;  Surgeon: Antonieta Iba, MD;  Location: [facility] ORS;  Service: Cardiovascular;  Laterality: N/A;  .  CORONARY ANGIOPLASTY  2009   s/p stent placement at [facility].    Family History  Problem Relation Age of Onset  . Heart attack Mother 65  . Hypertension Mother   . Cancer Brother    Social History:  reports that he has quit smoking. His smoking use included cigarettes. He has a 35.00 pack-year smoking history. He has never used smokeless tobacco. He reports previous alcohol use. He reports current drug use.  Allergies:  Allergies  Allergen Reactions  . Aspirin Other (See Comments)    Told not to take because of blood thinner  . Entresto [Sacubitril-Valsartan] Other (See Comments)    Dizziness, weakness and stuttering  . Ibuprofen Nausea And Vomiting  . Tylenol [Acetaminophen] Nausea And Vomiting    Medications Prior to Admission  Medication Sig Dispense Refill  . atorvastatin (LIPITOR) 80 MG tablet Take 80 mg by mouth daily.    . budesonide-formoterol (SYMBICORT) 160-4.5 MCG/ACT inhaler Inhale 2 puffs into the lungs 2 (two) times daily.    . carvedilol (COREG) 6.25 MG tablet Take 1 tablet (6.25 mg total) by mouth 2 (two) times daily with a meal. 60 tablet 1  . magnesium oxide (MAG-OX) 400 MG tablet Take 400 mg by mouth 2 (two) times daily.    . methocarbamol (ROBAXIN) 500 MG tablet Take 500 mg by mouth 4 (four) times daily.    . nitroGLYCERIN (NITROSTAT) 0.4 MG SL tablet Place 0.4 mg under the tongue every 5 (five) minutes as needed for chest pain.    [name] oxyCODONE (OXY IR/ROXICODONE) 5  MG immediate release tablet Take 5 mg by mouth 2 (two) times a day.    . rivaroxaban (XARELTO) 20 MG TABS tablet Take 20 mg by mouth daily with supper.    . sennosides-docusate sodium (SENOKOT-S) 8.6-50 MG tablet Take 2 tablets by mouth daily.    [name] spironolactone (ALDACTONE) 25 MG tablet Take 1 tablet (25 mg total) by mouth daily. 90 tablet 3  . Tiotropium Bromide Monohydrate 2.5 MCG/ACT AERS Inhale 2 puffs into the lungs daily.    . traZODone (DESYREL) 50 MG tablet Take 50 mg by mouth at bedtime.    .  docusate sodium (COLACE) 100 MG capsule Take 1 capsule (100 mg total) by mouth 2 (two) times daily. (Patient not taking: Reported on 04/05/2019) 60 capsule 6  . rivaroxaban (XARELTO) 15 MG TABS tablet Take 1 tablet (15 mg total) by mouth daily with supper. (Patient not taking: Reported on 04/05/2019) 30 tablet 1    ROS: As pr HPI  Physical Examination: Blood pressure (!) 117/98, pulse (!) 116, temperature 98.1 F (36.7 C), temperature source Oral, resp. rate 16, height 5\' 11"  (1.803 m), weight 99.8 kg, SpO2 100 %.  HEENT-  Normocephalic, no lesions, without obvious abnormality.  Normal external eye and conjunctiva.  Normal TM's bilaterally.  Normal auditory canals and external ears. Normal external nose, mucus membranes and septum.  Normal pharynx. Neck supple with no masses, nodes, nodules or enlargement. Cardiovascular - regular rate and rhythm, S1, S2 normal, no murmur, click, rub or gallop Lungs - chest clear, no wheezing, rales, normal symmetric air entry, Heart exam - S1, S2 normal, no murmur, no gallop, rate regular Abdomen - soft, non-tender; bowel sounds normal; no masses,  no organomegaly Extremities - 1+ edema in LEX  Neurologic Examination: Alert, awake, following commands, slight dysrathria PERLA, EOMI, no nystagmus, VFF, face seems symmetrical, face sensation tot touch seems symmetrical, uvukla and tongue midline He is 4/5 in all extremities No sensory deficit appreciated No coordination deficit appreciated DTR not checked at time of examination Gait: Patient walked around with PT   Results for orders placed or performed during the hospital encounter of 04/04/19 (from the past 48 hour(s))  Glucose, capillary     Status: Abnormal   Collection Time: 04/07/19 12:01 PM  Result Value Ref Range   Glucose-Capillary 460 (H) 70 - 99 mg/dL  Glucose, capillary     Status: None   Collection Time: 04/07/19  5:18 PM  Result Value Ref Range   Glucose-Capillary 92 70 - 99 mg/dL   Glucose, capillary     Status: Abnormal   Collection Time: 04/07/19  9:57 PM  Result Value Ref Range   Glucose-Capillary 181 (H) 70 - 99 mg/dL  Basic metabolic panel     Status: Abnormal   Collection Time: 04/08/19  7:16 AM  Result Value Ref Range   Sodium 137 135 - 145 mmol/L   Potassium 4.3 3.5 - 5.1 mmol/L   Chloride 104 98 - 111 mmol/L   CO2 24 22 - 32 mmol/L   Glucose, Bld 135 (H) 70 - 99 mg/dL   BUN 37 (H) 8 - 23 mg/dL   Creatinine, Ser 1.61 (H) 0.61 - 1.24 mg/dL   Calcium 9.1 8.9 - 10.3 mg/dL   GFR calc non Af Amer 45 (L) >60 mL/min   GFR calc Af Amer 52 (L) >60 mL/min   Anion gap 9 5 - 15    Comment: Performed at [facility], [address] [place],  [place] [id number]  CBC     Status: Abnormal   Collection Time: 04/08/19  7:16 AM  Result Value Ref Range   WBC 6.1 4.0 - 10.5 K/uL   RBC 3.97 (L) 4.22 - 5.81 MIL/uL   Hemoglobin 11.5 (L) 13.0 - 17.0 g/dL   HCT 19.136.6 (L) 47.839.0 - 29.552.0 %   MCV 92.2 80.0 - 100.0 fL   MCH 29.0 26.0 - 34.0 pg   MCHC 31.4 30.0 - 36.0 g/dL   RDW 62.119.7 (H) 30.811.5 - 65.715.5 %   Platelets 106 (L) 150 - 400 K/uL    Comment: Immature Platelet Fraction may be clinically indicated, consider ordering this additional test [id number]    nRBC 1.3 (H) 0.0 - 0.2 %    Comment: Performed at [name], [address] [place], [place] [address]  Glucose, capillary     Status: Abnormal   Collection Time: 04/08/19  7:57 AM  Result Value Ref Range   Glucose-Capillary 123 (H) 70 - 99 mg/dL  Glucose, capillary     Status: Abnormal   Collection Time: 04/08/19 12:11 PM  Result Value Ref Range   Glucose-Capillary 124 (H) 70 - 99 mg/dL  Glucose, capillary     Status: Abnormal   Collection Time: 04/08/19  8:54 PM  Result Value Ref Range   Glucose-Capillary 205 (H) 70 - 99 mg/dL  Glucose, capillary     Status: Abnormal   Collection Time: 04/08/19  9:45 PM  Result Value Ref Range   Glucose-Capillary 242 (H) 70 - 99 mg/dL  Glucose, capillary      Status: Abnormal   Collection Time: 04/09/19  7:48 AM  Result Value Ref Range   Glucose-Capillary 142 (H) 70 - 99 mg/dL   Ct Head Wo Contrast  Result Date: 04/08/2019 CLINICAL DATA:  63 year old male with new onset confusion. MRI brain yesterday revealing multifocal acute left hemisphere infarcts. EXAM: CT HEAD WITHOUT CONTRAST TECHNIQUE: Contiguous axial images were obtained from the base of the skull through the vertex without intravenous contrast. COMPARISON:  Brain MRI 04/07/2019. Head CT 04/06/2019, and earlier. FINDINGS: Brain: No midline shift, mass effect, or evidence of intracranial mass lesion. No acute intracranial hemorrhage identified. No ventriculomegaly. Small areas of patchy hypodensity corresponding to the watershed type restricted diffusion seen by MRI yesterday. Gray-white matter differentiation elsewhere is stable. No new cortically based infarct identified. Vascular: Calcified atherosclerosis at the skull base. No suspicious intracranial vascular hyperdensity. Skull: Negative. Sinuses/Orbits: Visualized paranasal sinuses and mastoids are stable and well pneumatized. Other: Visualized orbits and scalp soft tissues are within normal limits. IMPRESSION: 1. Expected CT appearance of the scattered small left hemisphere infarcts seen by MRI yesterday. No associated hemorrhage or mass effect. 2. No new intracranial abnormality. Electronically Signed   By: Odessa FlemingH  Hall M.D.   On: 04/08/2019 14:00   Ct Angio Neck W Or Wo Contrast  Result Date: 04/07/2019 CLINICAL DATA:  Right-sided weakness, known left ICA stenosis; carotid stenosis, known, follow-up. EXAM: CT ANGIOGRAPHY NECK TECHNIQUE: Multidetector CT imaging of the neck was performed using the standard protocol during bolus administration of intravenous contrast. Multiplanar CT image reconstructions and MIPs were obtained to evaluate the vascular anatomy. Carotid stenosis measurements (when applicable) are obtained utilizing NASCET criteria,  using the distal internal carotid diameter as the denominator. CONTRAST:  75mL OMNIPAQUE IOHEXOL 350 MG/ML SOLN COMPARISON:  CT angiogram head/neck 06/13/2018 FINDINGS: Aortic arch: Standard branching. Imaged portions of the aortic arch demonstrate no evidence of dissection or aneurysm. Soft and  calcified plaque within the visualized aortic arch and proximal major branch vessels of the neck. Right carotid system: Scattered soft and calcified plaque within the CCA. Soft plaque within the distal CCA with resultant 50% stenosis. Again demonstrated is soft and calcified plaque at the carotid bifurcation and within the proximal ICA with resultant 60% stenosis of the proximal ICA. Distal to this, the ICA is patent within the neck without significant stenosis. Heavily calcified partially visualized right internal carotid artery siphon. Sites of at least moderate stenosis within the visualized cavernous ICA (measured at 50-75% on prior CTA head 06/13/2018). Left carotid system: Scattered soft and calcified plaque within the common carotid artery without significant stenosis. Severe soft and calcified plaque at the bifurcation and within the proximal ICA. There is either near complete occlusion of the proximal left ICA or complete occlusion with immediate reconstitution. New from prior examination, the mid to distal cervical ICA is diminutive in caliber with asymmetrically decreased enhancement. Diminished and irregular opacification is seen within the mid to distal cervical ICA and within the visualized ICA siphon Vertebral arteries: The origin of the right vertebral artery appears patent without significant stenosis, although streak artifact from a dense right-sided contrast bolus somewhat limits evaluation. Distal to this, scattered soft and calcified plaque within the cervical right vertebral artery with regions of mild-to-moderate stenosis (greatest at approximately the C5 level, series 7, image 164). Calcified plaque  within the intracranial right vertebral artery without significant stenosis. The right vertebral artery is slightly dominant. Calcified plaque within the V1 left vertebral artery with moderate focal stenosis. Distal to this, there is additional scattered soft and calcified plaque within the cervical left vertebral artery with additional sites of mild to moderate stenosis. Calcified plaque within the intracranial left vertebral artery with sites of moderate/severe stenosis. Skeleton: Reversal of the expected cervical lordosis. Moderate/severe cervical spondylosis with multilevel posterior disc osteophytes, uncovertebral and facet hypertrophy. Poor dentition with numerous absent teeth. Other neck: No soft tissue neck mass or pathologically enlarged cervical chain lymph nodes. Upper chest: No consolidation within the imaged lung apices. Left internal carotid artery findings called by telephone at the time of interpretation on 04/07/2019 at 2:25 pm to provider Dr. Hilda Lias, who verbally acknowledged these results. IMPRESSION: 1. Prominent atherosclerotic disease within the visualized aortic arch and major branch vessels of the neck, most notably as follows. 2. Severe soft and calcified plaque within the left carotid bifurcation and proximal ICA. Near complete occlusion versus complete occlusion with immediate reconstitution within the proximal left ICA. New from prior CTA in 2019, the mid to distal cervical left ICA is diminutive in caliber with asymmetrically decreased enhancement. Diminished and irregular opacification is also seen within the visualized left carotid artery siphon. Correlate with findings on carotid artery duplex performed 04/05/2019. 3. Estimated 60% stenosis of the proximal right ICA. There is also an estimated 50% stenosis within the distal right common carotid artery. 4. Heavily calcified right internal carotid artery siphon with regions of at least moderate stenosis in the cavernous right ICA.  5. Soft and calcified plaque within the bilateral vertebral arteries. Moderate focal stenosis within the V1 left vertebral artery. Additional sites of mild/moderate stenosis within the bilateral cervical vertebral arteries. 6. Calcified plaque within the non-dominant intracranial left vertebral artery with sites of moderate/severe stenosis. Electronically Signed   By: Jackey Loge   On: 04/07/2019 14:25   Mr Brain Wo Contrast  Result Date: 04/07/2019 CLINICAL DATA:  Focal neuro deficit, greater than 6 hours, stroke  suspected, right-sided weakness and positive stroke on CT scan. EXAM: MRI HEAD WITHOUT CONTRAST TECHNIQUE: Multiplanar, multiecho pulse sequences of the brain and surrounding structures were obtained without intravenous contrast. COMPARISON:  CT angiogram neck 04/07/2019, noncontrast head CT 04/06/2019, brain MRI 06/14/2018 FINDINGS: Brain: There is a 1.7 x 1.1 cm focus of restricted diffusion within the left frontal lobe subcortical white matter consistent with acute infarct. There are numerous additional punctate and small acute infarcts within the left cerebral hemisphere which are predominantly within the left MCA/ACA and MCA/PCA watershed territories, involving the cortex as well as subcortical and deep periventricular white matter. Additional 3 mm acute infarct within the anterior left lentiform nucleus. Curvilinear cortical/subcortical acute infarct measuring 2.0 x 0.4 cm within the left occipital lobe PCA vascular territory (series 2, image 24). There is an additional punctate acute cortical/subcortical infarct within the right occipital lobe PCA vascular territory (series 2, image 22). Corresponding T2/FLAIR hyperintensity at some of the sites. Moderate scattered and confluent T2/FLAIR hyperintensity within the cerebral white matter and pons, consistent with chronic small vessel ischemic disease and progressed from prior MRI 06/14/2018. No midline shift or extra-axial fluid collection. No  evidence of intracranial mass. No chronic intracranial blood products. Vascular: Please refer to CT angiogram neck performed earlier the same day for a description of diminished and irregular enhancement within the left internal carotid artery siphon. Otherwise, no definite loss of flow voids proximally. Skull and upper cervical spine: No focal marrow lesion. Sinuses/Orbits: Visualized orbits demonstrate no acute abnormality. Mild scattered paranasal sinus mucosal thickening. No significant mastoid effusion. These results were called by telephone at the time of interpretation on 04/07/2019 at 4:03 pm to provider [facility] , who verbally acknowledged these results. IMPRESSION: 1. 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 These infarcts are predominantly within the MCA/ACA and MCA/PCA watershed territories. Additional punctate acute infarct within the anterior left lentiform nucleus. 2. Small acute infarcts also present within the bilateral occipital lobes within the PCA vascular territories. 3. Moderate chronic small vessel ischemic disease, progressed as compared to prior MRI 06/14/2018. Electronically Signed   By: Jackey Loge   On: 04/07/2019 16:03    Assessment: 63 y.o. male with history of CAD, CHF, HTN, HLD, atrial fibrillation on Xarelto, CVA admitted with right sided weakness and slurred speech in whom head ct shows a left frontal hypodensity most likely c/w subacute infarct.  Plan: 1. HgbA1c, fasting lipid panel 2. MRI, MRA  of the brain without contrast 3. PT consult, OT consult, Speech consult 4. Echocardiogram 5. Carotid dopplers: given finding of ct angio on 05/2018, consider cardiovascular consult 6. Prophylactic therapy-continue home xarelto 7. Risk factor modification 8. Telemetry monitoring 9. Frequent neuro checks  04/09/2019, 8:31 AM

## 2019-04-09 NOTE — Progress Notes (Signed)
[employer] - [place] [facility]     PATIENT NAME: Jonell CluckSamuel Powell    MR#:  [id number]  DATE OF BIRTH:  [birth year]  SUBJECTIVE:   Pt. Here due to right sided weakness and mostly has RUE weakness.   Patient's MRI was positive for Multiple acute infarcts within the left cerebral hemisphere involving the cortex as well as subcortical and deep periventricular white matter.    Patient CTA of the neck Severe soft and calcified plaque within the left carotid bifurcation and proximal ICA. Near complete occlusion versus complete occlusion with immediate reconstitution within the proximal left ICA.  Patient reported to have had low blood pressure last night and given IV fluid bolus with improvement in blood pressure. No new complaint this morning.  Patient sitting up in bed and having breakfast. REVIEW OF SYSTEMS:    Review of Systems  Constitutional: Negative for chills and fever.  HENT: Negative for congestion and tinnitus.   Eyes: Negative for blurred vision and double vision.  Respiratory: Negative for cough, shortness of breath and wheezing.   Cardiovascular: Negative for chest pain, orthopnea and PND.  Gastrointestinal: Negative for abdominal pain, diarrhea, nausea and vomiting.  Genitourinary: Negative for dysuria and hematuria.  Neurological: Positive for weakness (Right Upper Ext.  ). Negative for dizziness, sensory change and focal weakness.  All other systems reviewed and are negative.   Nutrition: Dysphagia III Tolerating Diet: Yes Tolerating PT: Eval noted.    DRUG ALLERGIES:   Allergies  Allergen Reactions  . Aspirin Other (See Comments)    Told not to take because of blood thinner  . Entresto [Sacubitril-Valsartan] Other (See Comments)    Dizziness, weakness and stuttering  . Ibuprofen Nausea And Vomiting  . Tylenol [Acetaminophen] Nausea And Vomiting    VITALS:  Blood pressure (!) 133/91, pulse (!) 120, temperature 97.8 F (36.6 C),  temperature source Oral, resp. rate 18, height 5\' 11"  (1.803 m), weight 99.8 kg, SpO2 99 %.  PHYSICAL EXAMINATION:   Physical Exam  GENERAL:  63 y.o.-year-old patient lying in bed in no acute distress.  EYES: Pupils equal, round, reactive to light and accommodation. No scleral icterus. Extraocular muscles intact.  HEENT: Head atraumatic, normocephalic. Oropharynx and nasopharynx clear.  NECK:  Supple, no jugular venous distention. No thyroid enlargement, no tenderness.  LUNGS: Normal breath sounds bilaterally, no wheezing, rales, rhonchi. No use of accessory muscles of respiration.  CARDIOVASCULAR: S1, S2 normal. No murmurs, rubs, or gallops.  ABDOMEN: Soft, nontender, nondistended. Bowel sounds present. No organomegaly or mass.  EXTREMITIES: No cyanosis, clubbing or edema b/l.    NEUROLOGIC: Cranial nerves II through XII are intact. Right upper Ext. Weakness 3/5 Strength. Globally weak.   PSYCHIATRIC: The patient is alert and oriented x 3.  SKIN: No obvious rash, lesion, or ulcer.    LABORATORY PANEL:   CBC Recent Labs  Lab 04/08/19 0716  WBC 6.1  HGB 11.5*  HCT 36.6*  PLT 106*   ------------------------------------------------------------------------------------------------------------------  Chemistries  Recent Labs  Lab 04/04/19 2041  04/08/19 0716  NA 135   < > 137  K 3.6   < > 4.3  CL 99   < > 104  CO2 24   < > 24  GLUCOSE 116*   < > 135*  BUN 26*   < > 37*  CREATININE 1.45*   < > 1.61*  CALCIUM 9.8   < > 9.1  AST 30  --   --   ALT 35  --   --  ALKPHOS 209*  --   --   BILITOT 1.8*  --   --    < > = values in this interval not displayed.   ------------------------------------------------------------------------------------------------------------------  Cardiac Enzymes No results for input(s): TROPONINI in the last 168 hours. ------------------------------------------------------------------------------------------------------------------  RADIOLOGY:   Ct Head Wo Contrast  Result Date: 04/08/2019 CLINICAL DATA:  63 year old male with new onset confusion. MRI brain yesterday revealing multifocal acute left hemisphere infarcts. EXAM: CT HEAD WITHOUT CONTRAST TECHNIQUE: Contiguous axial images were obtained from the base of the skull through the vertex without intravenous contrast. COMPARISON:  Brain MRI 04/07/2019. Head CT 04/06/2019, and earlier. FINDINGS: Brain: No midline shift, mass effect, or evidence of intracranial mass lesion. No acute intracranial hemorrhage identified. No ventriculomegaly. Small areas of patchy hypodensity corresponding to the watershed type restricted diffusion seen by MRI yesterday. Gray-white matter differentiation elsewhere is stable. No new cortically based infarct identified. Vascular: Calcified atherosclerosis at the skull base. No suspicious intracranial vascular hyperdensity. Skull: Negative. Sinuses/Orbits: Visualized paranasal sinuses and mastoids are stable and well pneumatized. Other: Visualized orbits and scalp soft tissues are within normal limits. IMPRESSION: 1. Expected CT appearance of the scattered small left hemisphere infarcts seen by MRI yesterday. No associated hemorrhage or mass effect. 2. No new intracranial abnormality. Electronically Signed   By: Genevie Ann M.D.   On: 04/08/2019 14:00   Mr Brain Wo Contrast  Result Date: 04/07/2019 CLINICAL DATA:  Focal neuro deficit, greater than 6 hours, stroke suspected, right-sided weakness and positive stroke on CT scan. EXAM: MRI HEAD WITHOUT CONTRAST TECHNIQUE: Multiplanar, multiecho pulse sequences of the brain and surrounding structures were obtained without intravenous contrast. COMPARISON:  CT angiogram neck 04/07/2019, noncontrast head CT 04/06/2019, brain MRI 06/14/2018 FINDINGS: Brain: There is a 1.7 x 1.1 cm focus of restricted diffusion within the left frontal lobe subcortical white matter consistent with acute infarct. There are numerous additional punctate  and small acute infarcts within the left cerebral hemisphere which are predominantly within the left MCA/ACA and MCA/PCA watershed territories, involving the cortex as well as subcortical and deep periventricular white matter. Additional 3 mm acute infarct within the anterior left lentiform nucleus. Curvilinear cortical/subcortical acute infarct measuring 2.0 x 0.4 cm within the left occipital lobe PCA vascular territory (series 2, image 24). There is an additional punctate acute cortical/subcortical infarct within the right occipital lobe PCA vascular territory (series 2, image 22). Corresponding T2/FLAIR hyperintensity at some of the sites. Moderate scattered and confluent T2/FLAIR hyperintensity within the cerebral white matter and pons, consistent with chronic small vessel ischemic disease and progressed from prior MRI 06/14/2018. No midline shift or extra-axial fluid collection. No evidence of intracranial mass. No chronic intracranial blood products. Vascular: Please refer to CT angiogram neck performed earlier the same day for a description of diminished and irregular enhancement within the left internal carotid artery siphon. Otherwise, no definite loss of flow voids proximally. Skull and upper cervical spine: No focal marrow lesion. Sinuses/Orbits: Visualized orbits demonstrate no acute abnormality. Mild scattered paranasal sinus mucosal thickening. No significant mastoid effusion. These results were called by telephone at the time of interpretation on 04/07/2019 at 4:03 pm to provider [facility] , who verbally acknowledged these results. IMPRESSION: 1. 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 These infarcts are predominantly within the MCA/ACA and MCA/PCA watershed territories. Additional punctate acute infarct within the anterior left lentiform nucleus. 2. Small acute infarcts also present within  the  bilateral occipital lobes within the PCA vascular territories. 3. Moderate chronic small vessel ischemic disease, progressed as compared to prior MRI 06/14/2018. Electronically Signed   By: Jackey Loge   On: 04/07/2019 16:03     ASSESSMENT AND PLAN:   63 year old male with past medical history of persistent atrial fibrillation, ischemic cardiomyopathy, hypertension, hyperlipidemia, history of gout, diabetes, COPD, chronic combined systolic diastolic CHF, carotid artery disease who presented to the hospital due to right upper extremity weakness and noted to have an acute CVA.  1.  Acute/subacute CVA-this was noted on the CT scan of the head on admission. -Patient was refusing MRI yesterday but agreed to it subsequently which showed Multiple acute infarcts within the left cerebral hemisphere involving the cortex as well as subcortical and deep periventricular white matter.  Patient with history of persistent atrial fibrillation - Patient being followed by neurologist.  I discussed case with him today.  Appreciate input.  Recommendation is to continue Xarelto.  Neurologist will make a decision if adding low-dose aspirin is indicated or not after vascular intervention Patient has previous history of carotid artery stenosis and is refused treatment in the past. - CTA of the neck recently showing Severe soft and calcified plaque within the left carotid bifurcation and proximal ICA. Near complete occlusion versus complete occlusion with immediate reconstitution within the proximal left ICA. - cont. PT and OT as tolerated.   2.  Carotid artery stenosis-patient has previous history of left-sided carotid stenosis and has been noncompliant with intervention as per vascular surgery. - CTA neck yesterday showed Severe soft and calcified plaque within the left carotid bifurcation and proximal ICA. Near complete occlusion versus complete occlusion with immediate reconstitution within the proximal left ICA.  -Vascular surgery following patient.  Patient had a cerebral angio on 10/09 with string sign ( greater than 90 % of left ICA.  Plan is for revascularization possibly with carotid endarterectomy after the weekend on Tuesday.  To continue anticoagulation for now.  Patient currently on Xarelto.  Will confirm with vascular surgery if they recommend switching to heparin drip prior to intervention.  Continue statins. Preop cardiology clearance already requested.  Would not control patient BP very tightly given his atherosclerosis to allow for some brain perfusion. Keep head flat or at least 15 degrees to allow for brain perfusion but patient reluctant to being placed 15 degrees due to complaint of shortness of breath.  Stat chest x-ray requested.  3. AMS/Encephalopathy ; improved  Patient had an episode of confusion on 04/08/2019.  Was reevaluated by neurology.  Neurology thinks this could possibly be seizures.  Repeat CT head done reviewed expected CT appearance of the scattered small left hemisphere infarcts seen by MRI previously.  Patient started on Keppra.  Follow-up on EEG.  4. Essential Hypertension - cont. Coreg, Aldactone  5. Hx of chronic Systolic CHF - clinically not in CHF.  - hold Lasix for now.  Cont. Aldactone, Coreg.  Follow-up on chest x-ray ordered today due to complaint of some shortness of breath.  6. Hyperlipidemia - cont. Atorvastatin  7. Hx of COPD - no acute exacerbation.  - cont. Symbicort, duonebs as needed, Spiriva.   8.  Diabetes type 2 with hyperglycemia- patient's A1c was as high as 8. - Continue sliding scale insulin, low dose Lantus.  BS stable.    - await DM Coordinator input.    DVT prophylaxis; patient on Xarelto  All the records are reviewed and case discussed with Care Management/Social  Worker. Management plans discussed with the patient, family and they are in agreement. I called and updated patient's wife on treatment plans as outlined above and all  questions were answered.  CODE STATUS: Full code  TOTAL TIME TAKING CARE OF THIS PATIENT: 35 minutes.   POSSIBLE D/C IN 4 DAYS, DEPENDING ON CLINICAL CONDITION.   Prudence Heiny M.D on 04/09/2019 at 12:11 PM  Between 7am to 6pm - Pager - [phone]  After 6pm go to [email or web address] - Therapist, [facility] Hospital[facility]  Office  [phone]  CC: Primary care physician; Center, [facility]

## 2019-04-09 NOTE — Plan of Care (Signed)
Pt was started on keppra last night. Pt is still weak and report he cant breathe even though oxygen level was >95%. Pt was given ativan for potential anxiety and pt has been resting since. This am neuro check was difficult to determine as pt kept falling asleep. Pt work up and wanted to stand up but was not able to stand very long.Pt BP has been fluctuating during shift as low as 80s. Pt was given 1L bolus during shift for low BP. No other signs of distress noted. Will continue to monitor.

## 2019-04-10 DIAGNOSIS — R0603 Acute respiratory distress: Secondary | ICD-10-CM

## 2019-04-10 LAB — APTT: aPTT: 49 seconds — ABNORMAL HIGH (ref 24–36)

## 2019-04-10 LAB — BASIC METABOLIC PANEL
Anion gap: 10 (ref 5–15)
BUN: 39 mg/dL — ABNORMAL HIGH (ref 8–23)
CO2: 20 mmol/L — ABNORMAL LOW (ref 22–32)
Calcium: 9 mg/dL (ref 8.9–10.3)
Chloride: 109 mmol/L (ref 98–111)
Creatinine, Ser: 2.02 mg/dL — ABNORMAL HIGH (ref 0.61–1.24)
GFR calc Af Amer: 40 mL/min — ABNORMAL LOW (ref >60)
GFR calc non Af Amer: 34 mL/min — ABNORMAL LOW (ref >60)
Glucose, Bld: 121 mg/dL — ABNORMAL HIGH (ref 70–99)
Potassium: 4.7 mmol/L (ref 3.5–5.1)
Sodium: 139 mmol/L (ref 135–145)

## 2019-04-10 LAB — CBC
HCT: 38.7 % — ABNORMAL LOW (ref 39.0–52.0)
Hemoglobin: 12.1 g/dL — ABNORMAL LOW (ref 13.0–17.0)
MCH: 29.2 pg (ref 26.0–34.0)
MCHC: 31.3 g/dL (ref 30.0–36.0)
MCV: 93.3 fL (ref 80.0–100.0)
Platelets: 129 10*3/uL — ABNORMAL LOW (ref 150–400)
RBC: 4.15 MIL/uL — ABNORMAL LOW (ref 4.22–5.81)
RDW: 20.9 % — ABNORMAL HIGH (ref 11.5–15.5)
WBC: 5.9 10*3/uL (ref 4.0–10.5)
nRBC: 2.7 % — ABNORMAL HIGH (ref 0.0–0.2)

## 2019-04-10 LAB — GLUCOSE, CAPILLARY
Glucose-Capillary: 121 mg/dL — ABNORMAL HIGH (ref 70–99)
Glucose-Capillary: 173 mg/dL — ABNORMAL HIGH (ref 70–99)
Glucose-Capillary: 162 mg/dL — ABNORMAL HIGH (ref 70–99)
Glucose-Capillary: 152 mg/dL — ABNORMAL HIGH (ref 70–99)
Glucose-Capillary: 113 mg/dL — ABNORMAL HIGH (ref 70–99)

## 2019-04-10 LAB — MRSA PCR SCREENING: MRSA by PCR: NEGATIVE

## 2019-04-10 LAB — MAGNESIUM: Magnesium: 2.9 mg/dL — ABNORMAL HIGH (ref 1.7–2.4)

## 2019-04-10 LAB — BLOOD GAS, ARTERIAL
Acid-base deficit: 2.6 mmol/L — ABNORMAL HIGH (ref 0.0–2.0)
Bicarbonate: 19.1 mmol/L — ABNORMAL LOW (ref 20.0–28.0)
FIO2: 0.28
O2 Saturation: 98.5 %
Patient temperature: 37
pCO2 arterial: 25 mmHg — ABNORMAL LOW (ref 32.0–48.0)
pH, Arterial: 7.49 — ABNORMAL HIGH (ref 7.350–7.450)
pO2, Arterial: 107 mmHg (ref 83.0–108.0)

## 2019-04-10 LAB — BRAIN NATRIURETIC PEPTIDE: B Natriuretic Peptide: 1018 pg/mL — ABNORMAL HIGH (ref 0.0–100.0)

## 2019-04-10 LAB — HEPARIN LEVEL (UNFRACTIONATED): Heparin Unfractionated: >3.6 IU/mL — ABNORMAL HIGH (ref 0.30–0.70)

## 2019-04-10 LAB — TROPONIN I (HIGH SENSITIVITY)
Troponin I (High Sensitivity): 145 ng/L (ref <18)
Troponin I (High Sensitivity): 131 ng/L (ref <18)

## 2019-04-10 LAB — PROTIME-INR
INR: 5.2 (ref 0.8–1.2)
Prothrombin Time: 46.8 seconds — ABNORMAL HIGH (ref 11.4–15.2)

## 2019-04-10 MED ORDER — CHLORHEXIDINE GLUCONATE CLOTH 2 % EX PADS
6.0000 | MEDICATED_PAD | Freq: Every day | CUTANEOUS | Status: DC
  Administered 2019-04-12 – 2019-04-14 (×3): 6 via TOPICAL

## 2019-04-10 MED ORDER — OXYCODONE HCL 5 MG PO TABS: 5.0000 mg | ORAL_TABLET | Freq: Two times a day (BID) | ORAL | Status: DC | PRN

## 2019-04-10 MED ORDER — SODIUM CHLORIDE 0.9 % IV SOLN
INTRAVENOUS | Status: DC | PRN
  Administered 2019-04-10 – 2019-04-16 (×2): 250 mL via INTRAVENOUS

## 2019-04-10 MED ORDER — HEPARIN (PORCINE) 25000 UT/250ML-% IV SOLN
1350.0000 [IU]/h | INTRAVENOUS | Status: DC
  Administered 2019-04-10: 1300 [IU]/h via INTRAVENOUS
  Administered 2019-04-11 (×2): 1450 [IU]/h via INTRAVENOUS
  Filled 2019-04-10 (×2): qty 250

## 2019-04-10 MED ORDER — BUDESONIDE 0.25 MG/2ML IN SUSP
0.2500 mg | Freq: Two times a day (BID) | RESPIRATORY_TRACT | Status: DC
  Administered 2019-04-10 – 2019-04-11 (×3): 0.25 mg via RESPIRATORY_TRACT
  Filled 2019-04-10 (×3): qty 2

## 2019-04-10 NOTE — Progress Notes (Signed)
Pt had critical troponin of 145 reported to MD. Stat EKG ordered and performed. Troponin will be trended. Pt is still lethargic and pulled out one of his IV this am. Pt still working to breathe at times and still having periods of apnea. No other signs of distress noted. Pass on information to oncoming nurse.

## 2019-04-10 NOTE — Progress Notes (Addendum)
Brief overnight report  Subjective: Per nursing staff, patient more lethargic with increased work of breathing.  Also noted with change in NIH stroke scale from 7 to 16.  Objective: Patient evaluated at the bedside, on exam he is lethargic but able to open his eyes to verbal stimuli. Follows commands with some difficulty.  Moves all extremities against gravity. No new focal neurologic deficit noted.  He is afebrile with blood pressure 124/101 mmHg, heart rate 114, respiration 24, sats 100% on 2 L nasal cannula.  Assessment: 62 y.o. male with history of CAD, CHF, HTN, HLD, atrial fibrillation on Xarelto, CVA admitted with right sided weakness and slurred speech in whom head ct shows a left frontal hypodensity most likely c/w subacute infarct.  Now noted with change in mental status and increased work of breathing  Plan: 1.  Stat head CT obtained which did not show acute intracranial abnormality 2.  ABGs shows no acute changes 3.  Chest x-ray also obtained showing worsening congestive heart failure 4.  Patient given 40 mg of IV Lasix, will continue to monitor renal function 5.  Patient placed on nocturnal BiPAP 6.  Hold sedatives, changed scheduled narcotics to PRN 8.  Follow labs in the a.m. we will also add BNP    Addendum: Notified of elevated troponin 145. BNP also elevated but slightly improved from baseline. Will continue to trend could be likely demand ischemia from CHF. Will get EKG and may need cardiology input as well.   Rufina Falco, DNP, CCRN, FNP-BC [facility] Nurse Practitioner Between 7am to 6pm - Pager [phone]  After 6pm go to [email or web address] - Proofreader  [employer]  [phone]

## 2019-04-10 NOTE — Progress Notes (Signed)
CRITICAL CARE NOTE 63 y.o. male with a hx of CAD W prior stenting, known afib/flutter and cardiomyopathy with prior stroke   Presented with an acute CVA with right upper extremity weakness  MR I imaging demonstrated multiple acute infarcts within the left cerebral hemisphere -CT of the neck showed a soft calcified plaque with near occlusion of left carotid  Angiography confirms and he is anticipated for surgical endarterectomy. Echo 10/20 EF 30-35%. CXR b/l Infiltrates Transferred to ICU for increased WOB, increased Somnolence with Cheyenne Stokes respirations   DIAGNOSIS: 1.  Occlusion left carotid artery  2.  Ambiguity of CT angios cervical carotids 3.  Acute CVA 4.acutue CHF exacerbation   CC  respiratory failure  SUBJECTIVE Patient with increased WOB High risk for intubation and cardiac arrest   BP (!) 135/106 (BP Location: Left Arm)   Pulse 86   Temp 97.6 F (36.4 C) (Oral)   Resp 14   Ht 5\' 11"  (1.803 m)   Wt 99.8 kg   SpO2 98%   BMI 30.69 kg/m    I/O last 3 completed shifts: In: 1441.8 [I.V.:1241.8; IV Piggyback:200] Out: 700 [Urine:700] Total I/O In: 240 [P.O.:240] Out: 1000 [Urine:1000]  SpO2: 98 % O2 Flow Rate (L/min): 2 L/min   SIGNIFICANT EVENTS   REVIEW OF SYSTEMS  PATIENT IS UNABLE TO PROVIDE COMPLETE REVIEW OF SYSTEMS DUE TO SEVERE CRITICAL ILLNESS   PHYSICAL EXAMINATION:  GENERAL:critically ill appearing, +resp distress HEAD: Normocephalic, atraumatic.  EYES: Pupils equal, round, reactive to light.  No scleral icterus.  MOUTH: Moist mucosal membrane. NECK: Supple.  PULMONARY: +rhonchi, +wheezing CARDIOVASCULAR: S1 and S2. Regular rate and rhythm. No murmurs, rubs, or gallops.  GASTROINTESTINAL: Soft, nontender, -distended. No masses. Positive bowel sounds. No hepatosplenomegaly.  MUSCULOSKELETAL: + edema.  NEUROLOGIC: lethargic SKIN:intact,warm,dry  MEDICATIONS: I have reviewed all medications and confirmed regimen as  documented   CULTURE RESULTS   Recent Results (from the past 240 hour(s))  Urine culture     Status: None   Collection Time: 04/04/19  7:37 PM   Specimen: Urine, Random  Result Value Ref Range Status   Specimen Description   Final    URINE, RANDOM Performed at [facility], [address] [place], [place] [address]    Special Requests   Final    NONE Performed at [facility], [address] [place], [place] [address]    Culture   Final    NO GROWTH Performed at [facility], [address] [place], [place] [address]    Report Status 04/05/2019 FINAL  Final  SARS CORONAVIRUS 2 (TAT 6-24 HRS) Nasopharyngeal Nasopharyngeal Swab     Status: None   Collection Time: 04/04/19  9:21 PM   Specimen: Nasopharyngeal Swab  Result Value Ref Range Status   SARS Coronavirus 2 NEGATIVE NEGATIVE Final    Comment: (NOTE) SARS-CoV-2 target nucleic acids are NOT DETECTED. The SARS-CoV-2 RNA is generally detectable in upper and lower respiratory specimens during the acute phase of infection. Negative results do not preclude SARS-CoV-2 infection, do not rule out co-infections with other pathogens, and should not be used as the sole basis for treatment or other patient management decisions. Negative results must be combined with clinical observations, patient history, and epidemiological information. The expected result is Negative. Fact Sheet for Patients: HairSlick.nohttps://www.fda.gov/media/138098/download Fact Sheet for Healthcare Providers: quierodirigir.comhttps://www.fda.gov/media/138095/download This test is not yet approved or cleared by the [place] FDA and  has been authorized for detection and/or diagnosis of SARS-CoV-2  by FDA under an Emergency Use Authorization (EUA). This EUA will remain  in effect (meaning this test can be used) for the duration of the COVID-19 declaration under Section 56 4(b)(1) of the Act, 21 U.S.C. section 360bbb-3(b)(1), unless the authorization is  terminated or revoked sooner. Performed at [facility], [place] [address] [place], [place] [address]           IMAGING    Dg Chest 1 View  Result Date: 04/09/2019 CLINICAL DATA:  Shortness of breath EXAM: CHEST  1 VIEW COMPARISON:  04/04/2019 FINDINGS: There is persistent cardiomegaly with worsening pulmonary edema and interstitial lung markings. No pneumothorax. There are probable small bilateral pleural effusions, right greater than left. IMPRESSION: Worsening congestive heart failure. Electronically Signed   By: Constance Holster M.D.   On: 04/09/2019 22:35   Ct Head Wo Contrast  Result Date: 04/09/2019 CLINICAL DATA:  Encephalopathy EXAM: CT HEAD WITHOUT CONTRAST TECHNIQUE: Contiguous axial images were obtained from the base of the skull through the vertex without intravenous contrast. COMPARISON:  04/08/2019 FINDINGS: Brain: There is no mass, hemorrhage or extra-axial collection. The size and configuration of the ventricles and extra-axial CSF spaces are normal. There is hypoattenuation of the white matter, most commonly indicating chronic small vessel disease. Multifocal hypoattenuation in the left hemispheric white matter consistent with known areas of infarct. Vascular: No abnormal hyperdensity of the major intracranial arteries or dural venous sinuses. No intracranial atherosclerosis. Skull: The visualized skull base, calvarium and extracranial soft tissues are normal. Sinuses/Orbits: No fluid levels or advanced mucosal thickening of the visualized paranasal sinuses. No mastoid or middle ear effusion. The orbits are normal. IMPRESSION: Chronic ischemic microangiopathy without acute intracranial abnormality. Electronically Signed   By: Ulyses Jarred M.D.   On: 04/09/2019 22:44    CBC    Component Value Date/Time   WBC 5.9 04/10/2019 0459   RBC 4.15 (L) 04/10/2019 0459   HGB 12.1 (L) 04/10/2019 0459   HGB 12.5 (L) 12/11/2012 1214   HCT 38.7 (L) 04/10/2019 0459   HCT 38.4  (L) 12/11/2012 1214   PLT 129 (L) 04/10/2019 0459   PLT 167 12/11/2012 1214   MCV 93.3 04/10/2019 0459   MCV 85 12/11/2012 1214   MCH 29.2 04/10/2019 0459   MCHC 31.3 04/10/2019 0459   RDW 20.9 (H) 04/10/2019 0459   RDW 16.5 (H) 12/11/2012 1214   LYMPHSABS 1.2 04/09/2019 2304   LYMPHSABS 1.1 01/16/2012 0549   MONOABS 0.5 04/09/2019 2304   MONOABS 0.4 01/16/2012 0549   EOSABS 0.1 04/09/2019 2304   EOSABS 0.0 01/16/2012 0549   BASOSABS 0.1 04/09/2019 2304   BASOSABS 0.0 01/16/2012 0549   BMP Latest Ref Rng & Units 04/10/2019 04/09/2019 04/08/2019  Glucose 70 - 99 mg/dL 121(H) 134(H) 135(H)  BUN 8 - 23 mg/dL 39(H) 38(H) 37(H)  Creatinine 0.61 - 1.24 mg/dL 2.02(H) 2.01(H) 1.61(H)  BUN/Creat Ratio 10 - 24 - - -  Sodium 135 - 145 mmol/L 139 138 137  Potassium 3.5 - 5.1 mmol/L 4.7 5.2(H) 4.3  Chloride 98 - 111 mmol/L 109 108 104  CO2 22 - 32 mmol/L 20(L) 18(L) 24  Calcium 8.9 - 10.3 mg/dL 9.0 9.1 9.1     ASSESSMENT AND PLAN SYNOPSIS   Severe ACUTE Hypoxic and Hypercapnic Respiratory Failure from acute CHF exacerbation with recent acute stroke with Cheyenne stokes Respirations with progressive cardiorenal syndrome biPAP as needed Oxygen as needed High risk for intubation   ACUTE SYSTOLIC CARDIAC FAILURE- EF 30% -  oxygen as needed -Lasix as tolerated -follow up cardiac enzymes as indicated -follow up cardiology recs    ACUTE KIDNEY INJURY/Renal Failure -follow chem 7 -follow UO -continue Foley Catheter-assess need -Avoid nephrotoxic agents -Recheck creatinine    NEUROLOGY Acute CVA Prognosis is poor   CARDIAC ICU monitoring   GI GI PROPHYLAXIS as indicated  NUTRITIONAL STATUS DIET-->NPO Constipation protocol as indicated  ENDO - will use ICU hypoglycemic\Hyperglycemia protocol if indicated   ELECTROLYTES -follow labs as needed -replace as needed -pharmacy consultation and following   DVT/GI PRX ordered TRANSFUSIONS AS NEEDED MONITOR  FSBS ASSESS the need for LABS as needed   Critical Care Time devoted to patient care services described in this note is 35   minutes.   Overall, patient is critically ill, prognosis is guarded.  Patient with Multiorgan failure and at high risk for cardiac arrest and death.    I called Wife but she is unavailable at this time.    Lucie Leather, M.D.  [facility]  Medical Director [facility] Medical Director [facility] Cardio-Pulmonary Department

## 2019-04-10 NOTE — Progress Notes (Signed)
PT Cancellation Note  Patient Details Name: Coben Godshall MRN: [id number] DOB: [birth year]   Cancelled Treatment:    Reason Eval/Treat Not Completed: Fatigue/lethargy limiting ability to participate.  Spoke to nursing who requested PT hold this date secondary to pt being lethargic and not appropriate for PT services at this time.  Will attempt to see pt tomorrow as medically appropriate.    Linus Salmons PT, DPT 04/10/19, 8:12 AM

## 2019-04-10 NOTE — Progress Notes (Signed)
Pt lethargic and somnolent with no change since am. VSS. Pt continues to have labor abdominal breathing with periods of apnea.  Called Dr Stark Jock check on pt. Dr Stark Jock present in pt's room. Pt to be transfer to ICU.

## 2019-04-10 NOTE — Progress Notes (Signed)
[employer] - [place] at [facility]     PATIENT NAME: [name]    MR#:  [id number]  DATE OF BIRTH:  [birth year]  SUBJECTIVE:  Last night patient reported to have been more lethargic with increased work of breathing.  Stat CT head did not show any acute intracranial findings.  Chest x-ray with findings of CHF.  Patient being diuresed with IV Lasix.  This morning patient more awake and alert.  Denied any chest pain.  Shortness of breath appears to be improving.  Patient seen by neurologist this morning.  REVIEW OF SYSTEMS:    Review of Systems  Constitutional: Negative for chills and fever.  HENT: Negative for congestion and tinnitus.   Eyes: Negative for blurred vision and double vision.  Respiratory: Negative for cough, shortness of breath and wheezing.   Cardiovascular: Negative for chest pain, orthopnea and PND.  Gastrointestinal: Negative for abdominal pain, diarrhea, nausea and vomiting.  Genitourinary: Negative for dysuria and hematuria.  Neurological: Positive for weakness (Right Upper Ext.  ). Negative for dizziness, sensory change and focal weakness.  All other systems reviewed and are negative.   Nutrition: Dysphagia III Tolerating Diet: Yes Tolerating PT: Eval noted.    DRUG ALLERGIES:   Allergies  Allergen Reactions   Aspirin Other (See Comments)    Told not to take because of blood thinner   Entresto [Sacubitril-Valsartan] Other (See Comments)    Dizziness, weakness and stuttering   Ibuprofen Nausea And Vomiting   Tylenol [Acetaminophen] Nausea And Vomiting    VITALS:  Blood pressure 132/90, pulse 88, temperature 97.6 F (36.4 C), temperature source Oral, resp. rate 16, height  (1.803 m), weight 99.8 kg, SpO2 96 %.  PHYSICAL EXAMINATION:   Physical Exam  GENERAL:  63 y.o.-year-old patient lying in bed in no acute distress.  EYES: Pupils equal, round, reactive to light and accommodation. No scleral icterus. Extraocular  muscles intact.  HEENT: Head atraumatic, normocephalic. Oropharynx and nasopharynx clear.  NECK:  Supple, no jugular venous distention. No thyroid enlargement, no tenderness.  LUNGS: Normal breath sounds bilaterally, no wheezing, rales, rhonchi. No use of accessory muscles of respiration.  CARDIOVASCULAR: S1, S2 normal. No murmurs, rubs, or gallops.  ABDOMEN: Soft, nontender, nondistended. Bowel sounds present. No organomegaly or mass.  EXTREMITIES: No cyanosis, clubbing or edema b/l.    NEUROLOGIC: Cranial nerves II through XII are intact. Right upper Ext weakness with strength of 1/5.  Global weakness with 3-4/5 Strength apart from right upper extremity PSYCHIATRIC: The patient is alert and oriented x 3.  SKIN: No obvious rash, lesion, or ulcer.    LABORATORY PANEL:   CBC Recent Labs  Lab 04/10/19 0459  WBC 5.9  HGB 12.1*  HCT 38.7*  PLT 129*   ------------------------------------------------------------------------------------------------------------------  Chemistries  Recent Labs  Lab 04/04/19 2041  04/10/19 0459  NA 135   < > 139  K 3.6   < > 4.7  CL 99   < > 109  CO2 24   < > 20*  GLUCOSE 116*   < > 121*  BUN 26*   < > 39*  CREATININE 1.45*   < > 2.02*  CALCIUM 9.8   < > 9.0  MG  --   --  2.9*  AST 30  --   --   ALT 35  --   --   ALKPHOS 209*  --   --   BILITOT 1.8*  --   --    < > =  values in this interval not displayed.   ------------------------------------------------------------------------------------------------------------------  Cardiac Enzymes No results for input(s): TROPONINI in the last 168 hours. ------------------------------------------------------------------------------------------------------------------  RADIOLOGY:  Dg Chest 1 View  Result Date: 04/09/2019 CLINICAL DATA:  Shortness of breath EXAM: CHEST  1 VIEW COMPARISON:  04/04/2019 FINDINGS: There is persistent cardiomegaly with worsening pulmonary edema and interstitial lung  markings. No pneumothorax. There are probable small bilateral pleural effusions, right greater than left. IMPRESSION: Worsening congestive heart failure. Electronically Signed   By: Katherine Mantlehristopher  Green M.D.   On: 04/09/2019 22:35   Dg Chest 1 View  Result Date: 04/09/2019 CLINICAL DATA:  Right side weakness. EXAM: CHEST  1 VIEW COMPARISON:  Single-view of the chest 04/04/2019. FINDINGS: There is cardiomegaly and pulmonary edema which is new since yesterday's study. No pneumothorax or pleural fluid. Atherosclerosis noted. IMPRESSION: New pulmonary edema. Cardiomegaly. Atherosclerosis. Electronically Signed   By: Drusilla Kannerhomas  Dalessio M.D.   On: 04/09/2019 13:01   Ct Head Wo Contrast  Result Date: 04/09/2019 CLINICAL DATA:  Encephalopathy EXAM: CT HEAD WITHOUT CONTRAST TECHNIQUE: Contiguous axial images were obtained from the base of the skull through the vertex without intravenous contrast. COMPARISON:  04/08/2019 FINDINGS: Brain: There is no mass, hemorrhage or extra-axial collection. The size and configuration of the ventricles and extra-axial CSF spaces are normal. There is hypoattenuation of the white matter, most commonly indicating chronic small vessel disease. Multifocal hypoattenuation in the left hemispheric white matter consistent with known areas of infarct. Vascular: No abnormal hyperdensity of the major intracranial arteries or dural venous sinuses. No intracranial atherosclerosis. Skull: The visualized skull base, calvarium and extracranial soft tissues are normal. Sinuses/Orbits: No fluid levels or advanced mucosal thickening of the visualized paranasal sinuses. No mastoid or middle ear effusion. The orbits are normal. IMPRESSION: Chronic ischemic microangiopathy without acute intracranial abnormality. Electronically Signed   By: Deatra RobinsonKevin  Herman M.D.   On: 04/09/2019 22:44   Ct Head Wo Contrast  Result Date: 04/08/2019 CLINICAL DATA:  63 year old male with new onset confusion. MRI brain yesterday  revealing multifocal acute left hemisphere infarcts. EXAM: CT HEAD WITHOUT CONTRAST TECHNIQUE: Contiguous axial images were obtained from the base of the skull through the vertex without intravenous contrast. COMPARISON:  Brain MRI 04/07/2019. Head CT 04/06/2019, and earlier. FINDINGS: Brain: No midline shift, mass effect, or evidence of intracranial mass lesion. No acute intracranial hemorrhage identified. No ventriculomegaly. Small areas of patchy hypodensity corresponding to the watershed type restricted diffusion seen by MRI yesterday. Gray-white matter differentiation elsewhere is stable. No new cortically based infarct identified. Vascular: Calcified atherosclerosis at the skull base. No suspicious intracranial vascular hyperdensity. Skull: Negative. Sinuses/Orbits: Visualized paranasal sinuses and mastoids are stable and well pneumatized. Other: Visualized orbits and scalp soft tissues are within normal limits. IMPRESSION: 1. Expected CT appearance of the scattered small left hemisphere infarcts seen by MRI yesterday. No associated hemorrhage or mass effect. 2. No new intracranial abnormality. Electronically Signed   By: Odessa FlemingH  Hall M.D.   On: 04/08/2019 14:00     ASSESSMENT AND PLAN:   63 year old male with past medical history of persistent atrial fibrillation, ischemic cardiomyopathy, hypertension, hyperlipidemia, history of gout, diabetes, COPD, chronic combined systolic diastolic CHF, carotid artery disease who presented to the hospital due to right upper extremity weakness and noted to have an acute CVA.  1.  Acute/subacute CVA-this was noted on the CT scan of the head on admission. -Patient was refusing MRI yesterday but agreed to it subsequently which showed Multiple acute infarcts within the  left cerebral hemisphere involving the cortex as well as subcortical and deep periventricular white matter.  Patient with history of persistent atrial fibrillation - Patient being followed by neurologist.   I discussed case with him.  Appreciate input.  Recommendation is to continue Xarelto.  Neurologist will make a decision if adding low-dose aspirin is indicated or not after vascular intervention Patient has previous history of carotid artery stenosis and is refused treatment in the past. - CTA of the neck recently showing Severe soft and calcified plaque within the left carotid bifurcation and proximal ICA. Near complete occlusion versus complete occlusion with immediate reconstitution within the proximal left ICA. - cont. PT and OT as tolerated.   2.  Carotid artery stenosis-patient has previous history of left-sided carotid stenosis and has been noncompliant with intervention as per vascular surgery. - CTA neck recently showed Severe soft and calcified plaque within the left carotid bifurcation and proximal ICA. Near complete occlusion versus complete occlusion with immediate reconstitution within the proximal left ICA. -Vascular surgery following patient.  Patient had a cerebral angio on 10/09 with string sign ( greater than 90 % of left ICA.  Plan is for revascularization possibly with carotid endarterectomy after the weekend on Tuesday pending cardiac preop evaluation.  To continue anticoagulation for now.  Patient was previously on Xarelto.  I discussed with vascular surgeon and decision was to transition to heparin drip from today due to plans for planned surgical intervention after the weekend.  Continue statins. Preop cardiology clearance already requested.  Would not control patient BP very tightly given his atherosclerosis to allow for some brain perfusion. Keep head flat or at least 15 degrees to allow for brain perfusion  3. AMS/Encephalopathy ; improved  Patient had an episode of confusion on 04/08/2019.  Was reevaluated by neurology.  Neurology thinks this could possibly be seizures.  Repeat CT head done reviewed expected CT appearance of the scattered small left hemisphere infarcts  seen by MRI previously.  Patient started on Keppra.  Follow-up on EEG.  4. Essential Hypertension - cont. Coreg, Aldactone  5.  Acute on chronic systolic CHF exacerbation.  Patient complained of worsening shortness of breath yesterday.  IV fluid initially started was discontinued yesterday.  Patient started on IV Lasix.  Continue Coreg.  Supplemental oxygen.  Patient required nocturnal BiPAP last night.  Noted elevated troponin of 145 .follow-up on repeat troponin   Cardiology consult already placed for preop evaluation.  I sent a text via haiku as well to cardiologist on-call Dr. Caryl Comes.  Patient had recent 2D echocardiogram done on 04/05/2019 which revealed ejection fraction of 30 to 35%.  Also revealed moderately increased left ventricular hypertrophy.  6. Hyperlipidemia - cont. Atorvastatin  7. Hx of COPD - no acute exacerbation.  - cont. Symbicort, duonebs as needed, Spiriva.   8.  Diabetes type 2 with hyperglycemia- patient's A1c was as high as 8. - Continue sliding scale insulin, low dose Lantus.  BS stable.    - await DM Coordinator input.    DVT prophylaxis; patient now on heparin drip  All the records are reviewed and case discussed with Care Management/Social Worker. Management plans discussed with the patient, family and they are in agreement. I called and updated patient's wife today on treatment plans as outlined above and all questions were answered.  CODE STATUS: Full code  TOTAL TIME TAKING CARE OF THIS PATIENT: 33 minutes.   POSSIBLE D/C IN 4 DAYS, DEPENDING ON CLINICAL CONDITION.   Deaveon Schoen Rayne  M.D on 04/10/2019 at 12:37 PM  Between 7am to 6pm - Pager - [phone]  After 6pm go to [email or web address] - Therapist, nutritional Hospitalists  Office  [phone]  CC: Primary care physician; Center, [facility]

## 2019-04-10 NOTE — Progress Notes (Signed)
10/11 Events overnight noted "Per nursing staff, patient more lethargic with increased work of breathing.  Also noted with change in NIH stroke scale from 7 to 16.on exam he is lethargic but able to open his eyes to verbal stimuli. Follows commands with some difficulty.  Moves all extremities against gravity. No new focal neurologic deficit noted.  He is afebrile with blood pressure 124/101 mmHg, heart rate 114, respiration 24, sats 100% on 2 L nasal cannula. Stat head CT obtained which did not show acute intracranial abnormality.ABGs shows no acute chang Chest x-ray also obtained showing worsening congestive heart failure. Patient given 40 mg of IV Lasix, will continue to monitor renal function. Patient placed on nocturnal BiPAP. Hold sedatives, changed scheduled narcotics to PRN Follow labs we will also add BNP"  This Am patient lying in bed. C/o SOB.  Neuro exam this AM: Patient lying on bed.  He is alert, awake, speech with slight dysarthria, oriented x3, following commands PERLA, EOMI, no nystagmus, VFF, face with flattening of right naso labial fold (questionable due to body habitus patient dentoulous) face sensation to touch seems symmetrical ( althought difficult to assess), uvula and tongue midline He is 4/5 on LUEX/LLEX/RLEX. He is 1/5 in RUEX and 1/5 in right hand No sensory deficit appreciated althought difficult to assess No coordination deficit appreciated DTR not checked at time of examination Gait not checked this AM.  RECS: - Continue neuro protectives measures including normothermia, normoglycemia, correct electrolytes/metabolic abnlites, treat any infection while admitted - Would not control patient BP very tightly given his atherosclerosis to allow for some brain perfusion. Keep head flat or at least 15 degrees to allow for brain perfusion - Will f/up on EEG. Continue keppra 500 mg BID and sz precautions - Appreciate CV recs and input; Per primary team patient is scheduled doe  CEA on Tuesday. - Patient on hep.drip - PT/OT - Discussed the case and plan of care with Primary team      10/10 Events overnight noted. Per nurse notes, BP fluctuating during the shift as low as 80 s. Patient given 1 l bolus. Patient started on Keppra 500 mg q 12 hr. Patient had a cerebral angio on 10/09 with string sign ( greater than 90 % of left ICA.  Neuro exam this AM: Patient lying on bed.  He is alert, awake, speech with slight dysarthria, oriented x3, following commands PERLA, EOMI, no nystagmus, VFF, face with flattening of right naso labial fold (questionable due to body habitus patient dentoulous) face sensation to touch seems symmetrical ( althought difficult to assess), uvula and tongue midline He is 4/5 on LUEX/LLEX/RLEX. He is 3/5 in RUEX and 1/5 in right hand No sensory deficit appreciated althought difficult to assess No coordination deficit appreciated DTR not checked at time of examination Gait not checked this AM.  RECS: - Continue neuro protectives measures including normothermia, normoglycemia, correct electrolytes/metabolic abnlites, treat any infection while admitted - Would not control patient BP very tightly given his atherosclerosis to allow for some brain perfusion. Keep head flat or at least 15 degrees to allow for brain perfusion - Will f/up on EEG - Appreciate CV recs and input. - Continue Xarelto - PT/OT - Discussed the case and plan of care with Primary team   10/09 - Patient states that he had an episode of urinary incontinence  - MRI 10/08: Multiple acute infarcts within the left cerebral hemisphere involving the cortex as well as subcortical and deep periventricular white matter. These infarcts  are predominantly within the MCA/ACA and MCA/PCA watershed territories. Additional punctate acute infarct within the anterior left lentiform nucleus.Small acute infarcts also present within the bilateral occipital lobes within the PCA vascular  territories.Moderate chronic small vessel ischemic disease, progressed as compared to prior MRI 06/14/2018.  - CTA 10/08: Prominent atherosclerotic disease within the visualized aortic arch and major branch vessels of the neck, most notably as follows.Severe soft and calcified plaque within the left carotid bifurcation and proximal ICA. Near complete occlusion versuscomplete occlusion with immediate reconstitution within the proxima lleft ICA. New from prior CTA in 2019, the mid to distal cervical left ICA is diminutive in caliber with asymmetrically decreased enhancement. Diminished and irregular opacification is also seen within the visualized left carotid artery siphon. Correlate with findings on carotid artery duplex performed 04/05/2019.Estimated 60% stenosis of the proximal right ICA. There is also an estimated 50% stenosis within the distal right common carotid artery.Heavily calcified right internal carotid artery siphon with regions of at least moderate stenosis in the cavernous right ICA.Soft and calcified plaque within the bilateral vertebral arteries. Moderate focal stenosis within the V1 left vertebral artery. Additional sites of mild/moderate stenosis within the bilateral cervical vertebral arteries. Calcified plaque within the non-dominant intracranial left vertebral artery with sites of moderate/severe stenosis.  Neuro exam remains stable. Patient resting comfortably in bed, stating that he is having a procedure today (Per CV notes, he is a having diagnostic cerebral angio) C/o right hand weakness  Neurologic Examination: Alert, awake, following commands, slight dysrathria PERLA, EOMI, no nystagmus, VFF, face seems symmetrical, face sensation tot touch seems symmetrical, uvukla and tongue midline He is 4/5 in all extremities. He is 1/5 in right wrist flexion/extension No sensory deficit appreciated. Difficult to assess No coordination deficit appreciated DTR not checked at time  of examination Gait: not checked   RECS: - Continue neuro protectives measures including normothermia, normoglycemia, correct electrolytes/metabolic abnlites, treat any infection while admitted - Would not control patient BP very tightly given his atherosclerosis to allow for some brain perfusion - Appreciate CV recs and input. - Continue Xarelto - PT/OT     10/07: No major neuro event overnight Patient had  Ahead ct in AM: when I interviewed the patient, patient was complaining of SOB, denies any new weakness or speech disturbances. Head ct: Unchanged appearance of probable subacute nonhemorrhagic stroke in the left frontal periventricular white matter compared with prior study of 04/04/2019. No evidence of acute intracranial hemorrhage or mass effect.  Patient  refused MRI  Neuro exam remains unchanged and stable Neurologic Examination: Alert, awake, following commands, slight dysrathria PERLA, EOMI, no nystagmus, VFF, face seems symmetrical, face sensation tot touch seems symmetrical, uvukla and tongue midline He is 4/5 in all extremities No sensory deficit appreciated No coordination deficit appreciated DTR not checked at time of examination Gait: Patient. Per pt, patient ambulating from bathroom to his bed  RECS: - Continue neuro protectives measures including normothermia, normoglycemia, correct electrolytes/metabolic abnlites, treat any infection - Would not control patient BP very tightly given his atherosclerosis to allow for some brain perfusion - Consider CV consult for optimization of his treatment regarding carotid occlusion if not candidate for surgery - Continue Xarelto - PT/OT - patient agreeble for MRI. Will f/up   10/06: initial consult Chief Complaint:  Left frontal stroke HPI: Purcell Jungbluth is an 63 y.o. male with history of CAD, CHF, HTN, HLD, atrial fibrillation on Xarelto, CVA admitted with right sided weakness and slurred speech.  Patient is a  limited  historian.  Per  Note, patient seen in this ED earlier on day of admission  for SOB, found to have acute on chronic exacerbation of his heart failure. Received IV Lasix and discharged with plan to follow up in heart failure clinic. After leaving the emergency department he developed some slurred speech and right-sided weakness.  His home health nurse checked on him and was concerned he may be experiencing a stroke, which prompted him to return to the emergency department. Patient states his symptoms started around 1 PM today.  He is on anticoagulation for his history of atrial fibrillation.  He reports a history of prior strokes with similar symptoms.  He denies any recent illnesses, no vomiting, diarrhea, urinary symptoms Head ct obtained in ER showing: Focal hypodensity within the left frontal white matter,suspicious for acute to subacute infarct. Negative for hemorrhage.Mild atrophy and small vessel ischemic changes of the white matter. Reviewing his chart, patient had an ct angio of neck and head on 05/2018 that showed Severe calcified and noncalcified plaque at the LEFT carotid bifurcation, extending into the proximal ICA. Estimated 80-90% stenosis.Non stenotic atheromatous change RIGHT carotid bifurcation, estimated 60% stenosis.no intracranial flow-limiting stenosis or large vessel occlusion.Significant calcified plaque both distal cavernous carotid arteries, estimated 50-75% stenosis bilaterally.Poor dentition, with numerous missing teeth, dental caries, andperiapical lucencies. Echo on 05/2018 with EF 30-35% with diffuse hypokinesis. Pateitn admitted for further management This AM, patient working with PT, standing and has walked around. States that he is sob  Past Medical History:  Diagnosis Date  . CAD in native artery    a. stress echo 12/2007 abnl, EF > 55%, b. LHC 01/28/08: mLAD 30, D1 40, dLCx 70, pRCA 30, mRCA 70, mRCA lesion 2 80, PDA 90, s/p PCI/BMS to prox and distal RCA, s/p PCI/BMS  to PDA; c. patient reports PCI/stenting x 2 in early 2017 at the [place] (no records on file) d. 06/2016: cath showing patent stents along RCA and LCx with moderate 40% stenosis along the LAD.   [name] Carotid arterial disease (HCC)    a. 05/2018 CTA Head/neck: 80-90% stenosis @ L carotid bifurcation, extending into the prox LICA. 60% stenosis @ R carotid bifurcation. 50-75% bilateral distal cavernous carotid dzs.  . Chronic combined systolic (congestive) and diastolic (congestive) heart failure (HCC)    a. 2009 Echo: > 55%; b. 06/2016: EF 25-30%; c. 12/2017 Echo: EF 30-35%, diff HK; d. 05/2018 Echo: 30-35%, mild conc LVH, diff HK. Mild MR. Mildly dil LA/RA.  [name] Chronic kidney disease   . COPD (chronic obstructive pulmonary disease) (HCC)   . Diabetes mellitus without complication (HCC)   . Gout   . Hyperlipidemia   . Hypertension   . Hypertensive heart disease   . Ischemic cardiomyopathy    a. 05/2018: 30-35%.  . Obesity   . Persistent atrial fibrillation (HCC) 01/2017   a. cardioverted 9/18 to NSR; b. 12/2017 noted to be back in Afib-outpt dccv rec; c. CHA2DS2VASc = 6-->Xarelto.  . Stroke (cerebrum) (HCC)    a. 05/2018 MRI: small patchy acute to subacute cortial and white matter infarct in the bilat cerebrum. Mild chronic small vessel ischmia; b. 05/2018 Carotid U/S: L-carotid bifurcation 80-90% stenosed, R- carotid bifurcation 60% stenosed.  . Tobacco abuse     Past Surgical History:  Procedure Laterality Date  . CARDIAC CATHETERIZATION  2009   [facility];   . CARDIAC CATHETERIZATION  2010  . CARDIAC CATHETERIZATION N/A 07/28/2016   Procedure: Right and Left Heart Cath and  possible PCI;  Surgeon: Iran Ouch, MD;  Location: [facility] INVASIVE CV LAB;  Service: Cardiovascular;  Laterality: N/A;  . CARDIOVERSION N/A 03/27/2017   Procedure: CARDIOVERSION;  Surgeon: Iran Ouch, MD;  Location: [facility] ORS;  Service: Cardiovascular;  Laterality: N/A;  . CARDIOVERSION N/A 07/30/2018   Procedure:  CARDIOVERSION;  Surgeon: Antonieta Iba, MD;  Location: [facility] ORS;  Service: Cardiovascular;  Laterality: N/A;  . CORONARY ANGIOPLASTY  2009   s/p stent placement at [facility] ([facility]).    Family History  Problem Relation Age of Onset  . Heart attack Mother 67  . Hypertension Mother   . Cancer Brother    Social History:  reports that he has quit smoking. His smoking use included cigarettes. He has a 35.00 pack-year smoking history. He has never used smokeless tobacco. He reports previous alcohol use. He reports current drug use.  Allergies:  Allergies  Allergen Reactions  . Aspirin Other (See Comments)    Told not to take because of blood thinner  . Entresto [Sacubitril-Valsartan] Other (See Comments)    Dizziness, weakness and stuttering  . Ibuprofen Nausea And Vomiting  . Tylenol [Acetaminophen] Nausea And Vomiting    Medications Prior to Admission  Medication Sig Dispense Refill  . atorvastatin (LIPITOR) 80 MG tablet Take 80 mg by mouth daily.    . budesonide-formoterol (SYMBICORT) 160-4.5 MCG/ACT inhaler Inhale 2 puffs into the lungs 2 (two) times daily.    . carvedilol (COREG) 6.25 MG tablet Take 1 tablet (6.25 mg total) by mouth 2 (two) times daily with a meal. 60 tablet 1  . magnesium oxide (MAG-OX) 400 MG tablet Take 400 mg by mouth 2 (two) times daily.    . methocarbamol (ROBAXIN) 500 MG tablet Take 500 mg by mouth 4 (four) times daily.    . nitroGLYCERIN (NITROSTAT) 0.4 MG SL tablet Place 0.4 mg under the tongue every 5 (five) minutes as needed for chest pain.    [name] oxyCODONE (OXY IR/ROXICODONE) 5 MG immediate release tablet Take 5 mg by mouth 2 (two) times a day.    . rivaroxaban (XARELTO) 20 MG TABS tablet Take 20 mg by mouth daily with supper.    . sennosides-docusate sodium (SENOKOT-S) 8.6-50 MG tablet Take 2 tablets by mouth daily.    [name] spironolactone (ALDACTONE) 25 MG tablet Take 1 tablet (25 mg total) by mouth daily. 90 tablet 3  . Tiotropium Bromide Monohydrate 2.5 MCG/ACT  AERS Inhale 2 puffs into the lungs daily.    . traZODone (DESYREL) 50 MG tablet Take 50 mg by mouth at bedtime.    . docusate sodium (COLACE) 100 MG capsule Take 1 capsule (100 mg total) by mouth 2 (two) times daily. (Patient not taking: Reported on 04/05/2019) 60 capsule 6  . rivaroxaban (XARELTO) 15 MG TABS tablet Take 1 tablet (15 mg total) by mouth daily with supper. (Patient not taking: Reported on 04/05/2019) 30 tablet 1    ROS: As pr HPI  Physical Examination: Blood pressure (!) 135/100, pulse (!) 114, temperature 97.6 F (36.4 C), resp. rate 14, height  (1.803 m), weight 99.8 kg, SpO2 97 %.  HEENT-  Normocephalic, no lesions, without obvious abnormality.  Normal external eye and conjunctiva.  Normal TM's bilaterally.  Normal auditory canals and external ears. Normal external nose, mucus membranes and septum.  Normal pharynx. Neck supple with no masses, nodes, nodules or enlargement. Cardiovascular - regular rate and rhythm, S1, S2 normal, no murmur, click, rub or gallop Lungs - chest clear,  no wheezing, rales, normal symmetric air entry, Heart exam - S1, S2 normal, no murmur, no gallop, rate regular Abdomen - soft, non-tender; bowel sounds normal; no masses,  no organomegaly Extremities - 1+ edema in LEX  Neurologic Examination: Alert, awake, following commands, slight dysrathria PERLA, EOMI, no nystagmus, VFF, face seems symmetrical, face sensation tot touch seems symmetrical, uvukla and tongue midline He is 4/5 in all extremities No sensory deficit appreciated No coordination deficit appreciated DTR not checked at time of examination Gait: Patient walked around with PT   Results for orders placed or performed during the hospital encounter of 04/04/19 (from the past 48 hour(s))  Glucose, capillary     Status: Abnormal   Collection Time: 04/08/19 12:11 PM  Result Value Ref Range   Glucose-Capillary 124 (H) 70 - 99 mg/dL  Glucose, capillary     Status: Abnormal    Collection Time: 04/08/19  8:54 PM  Result Value Ref Range   Glucose-Capillary 205 (H) 70 - 99 mg/dL  Glucose, capillary     Status: Abnormal   Collection Time: 04/08/19  9:45 PM  Result Value Ref Range   Glucose-Capillary 242 (H) 70 - 99 mg/dL  Glucose, capillary     Status: Abnormal   Collection Time: 04/09/19  7:48 AM  Result Value Ref Range   Glucose-Capillary 142 (H) 70 - 99 mg/dL  Glucose, capillary     Status: Abnormal   Collection Time: 04/09/19 11:33 AM  Result Value Ref Range   Glucose-Capillary 130 (H) 70 - 99 mg/dL  Glucose, capillary     Status: Abnormal   Collection Time: 04/09/19  5:04 PM  Result Value Ref Range   Glucose-Capillary 120 (H) 70 - 99 mg/dL  Glucose, capillary     Status: Abnormal   Collection Time: 04/09/19  8:35 PM  Result Value Ref Range   Glucose-Capillary 140 (H) 70 - 99 mg/dL  Blood gas, arterial     Status: Abnormal (Preliminary result)   Collection Time: 04/09/19 10:00 PM  Result Value Ref Range   FIO2 0.28    Delivery systems [place] 2 LPM    pH, Arterial 7.36 7.350 - 7.450   pCO2 arterial 35 32.0 - 48.0 mmHg   pO2, Arterial 45 (L) 83.0 - 108.0 mmHg   Bicarbonate 19.8 (L) 20.0 - 28.0 mmol/L   Acid-base deficit 4.9 (H) 0.0 - 2.0 mmol/L   O2 Saturation 78.6 %   Patient temperature 37.0    Collection site RIGHT RADIAL    Sample type ARTERIAL DRAW    Allens test (pass/fail) PASS PASS    Comment: Performed at [facility], [address] [place], [place] [address]   Mechanical Rate PENDING   CBC with Differential/Platelet     Status: Abnormal   Collection Time: 04/09/19 11:04 PM  Result Value Ref Range   WBC 6.5 4.0 - 10.5 K/uL   RBC 4.20 (L) 4.22 - 5.81 MIL/uL   Hemoglobin 12.2 (L) 13.0 - 17.0 g/dL   HCT 27.5 17.0 - 01.7 %   MCV 95.5 80.0 - 100.0 fL   MCH 29.0 26.0 - 34.0 pg   MCHC 30.4 30.0 - 36.0 g/dL   RDW 49.4 (H) 49.6 - 75.9 %   Platelets 150 150 - 400 K/uL   nRBC 4.1 (H) 0.0 - 0.2 %   Neutrophils Relative % 73 %    Neutro Abs 4.7 1.7 - 7.7 K/uL   Lymphocytes Relative 18 %   Lymphs Abs 1.2 0.7 -  4.0 K/uL   Monocytes Relative 7 %   Monocytes Absolute 0.5 0.1 - 1.0 K/uL   Eosinophils Relative 1 %   Eosinophils Absolute 0.1 0.0 - 0.5 K/uL   Basophils Relative 1 %   Basophils Absolute 0.1 0.0 - 0.1 K/uL   Immature Granulocytes 0 %   Abs Immature Granulocytes 0.02 0.00 - 0.07 K/uL    Comment: Performed at [facility], [address] [place], [place] [address]  Basic metabolic panel     Status: Abnormal   Collection Time: 04/09/19 11:04 PM  Result Value Ref Range   Sodium 138 135 - 145 mmol/L   Potassium 5.2 (H) 3.5 - 5.1 mmol/L   Chloride 108 98 - 111 mmol/L   CO2 18 (L) 22 - 32 mmol/L   Glucose, Bld 134 (H) 70 - 99 mg/dL   BUN 38 (H) 8 - 23 mg/dL   Creatinine, Ser 4.09 (H) 0.61 - 1.24 mg/dL   Calcium 9.1 8.9 - 81.1 mg/dL   GFR calc non Af Amer 35 (L) >60 mL/min   GFR calc Af Amer 40 (L) >60 mL/min   Anion gap 12 5 - 15    Comment: Performed at [facility], [address] [place], [place] [address]  Basic metabolic panel     Status: Abnormal   Collection Time: 04/10/19  4:59 AM  Result Value Ref Range   Sodium 139 135 - 145 mmol/L   Potassium 4.7 3.5 - 5.1 mmol/L   Chloride 109 98 - 111 mmol/L   CO2 20 (L) 22 - 32 mmol/L   Glucose, Bld 121 (H) 70 - 99 mg/dL   BUN 39 (H) 8 - 23 mg/dL   Creatinine, Ser 2.95 (H) 0.61 - 1.24 mg/dL   Calcium 9.0 8.9 - 62.1 mg/dL   GFR calc non Af Amer 34 (L) >60 mL/min   GFR calc Af Amer 40 (L) >60 mL/min   Anion gap 10 5 - 15    Comment: Performed at [facility], [address] [place], [place] [address]  CBC     Status: Abnormal   Collection Time: 04/10/19  4:59 AM  Result Value Ref Range   WBC 5.9 4.0 - 10.5 K/uL   RBC 4.15 (L) 4.22 - 5.81 MIL/uL   Hemoglobin 12.1 (L) 13.0 - 17.0 g/dL   HCT 78.4 (L) 69.6 - 29.5 %   MCV 93.3 80.0 - 100.0 fL   MCH 29.2 26.0 - 34.0 pg   MCHC 31.3 30.0 - 36.0 g/dL   RDW 28.4 (H) 13.2 -  15.5 %   Platelets 129 (L) 150 - 400 K/uL   nRBC 2.7 (H) 0.0 - 0.2 %    Comment: Performed at [facility], [address] [place], [place] [address]  Magnesium     Status: Abnormal   Collection Time: 04/10/19  4:59 AM  Result Value Ref Range   Magnesium 2.9 (H) 1.7 - 2.4 mg/dL    Comment: Performed at [facility], [address] [place], [place] [address]  Brain natriuretic peptide     Status: Abnormal   Collection Time: 04/10/19  4:59 AM  Result Value Ref Range   B Natriuretic Peptide 1,018.0 (H) 0.0 - 100.0 pg/mL    Comment: Performed at [facility], [address] [place], [place] [address]  Troponin I (High Sensitivity)     Status: Abnormal   Collection Time: 04/10/19  4:59 AM  Result Value Ref Range  Troponin I (High Sensitivity) 145 (HH) <18 ng/L    Comment: CRITICAL RESULT CALLED TO, READ BACK BY AND VERIFIED WITH EDNA GARNETT AT 6213 ON 04/10/2019 JJB (NOTE) Elevated high sensitivity troponin I (hsTnI) values and significant  changes across serial measurements may suggest ACS but many other  chronic and acute conditions are known to elevate hsTnI results.  Refer to the "Links" section for chest pain algorithms and additional  guidance. Performed at [facility], [address] [place], [place] [address]   Glucose, capillary     Status: Abnormal   Collection Time: 04/10/19  8:13 AM  Result Value Ref Range   Glucose-Capillary 121 (H) 70 - 99 mg/dL   Comment 1 Notify RN    Dg Chest 1 View  Result Date: 04/09/2019 CLINICAL DATA:  Shortness of breath EXAM: CHEST  1 VIEW COMPARISON:  04/04/2019 FINDINGS: There is persistent cardiomegaly with worsening pulmonary edema and interstitial lung markings. No pneumothorax. There are probable small bilateral pleural effusions, right greater than left. IMPRESSION: Worsening congestive heart failure. Electronically Signed   By: Constance Holster M.D.   On: 04/09/2019 22:35   Dg Chest 1  View  Result Date: 04/09/2019 CLINICAL DATA:  Right side weakness. EXAM: CHEST  1 VIEW COMPARISON:  Single-view of the chest 04/04/2019. FINDINGS: There is cardiomegaly and pulmonary edema which is new since yesterday's study. No pneumothorax or pleural fluid. Atherosclerosis noted. IMPRESSION: New pulmonary edema. Cardiomegaly. Atherosclerosis. Electronically Signed   By: Inge Rise M.D.   On: 04/09/2019 13:01   Ct Head Wo Contrast  Result Date: 04/09/2019 CLINICAL DATA:  Encephalopathy EXAM: CT HEAD WITHOUT CONTRAST TECHNIQUE: Contiguous axial images were obtained from the base of the skull through the vertex without intravenous contrast. COMPARISON:  04/08/2019 FINDINGS: Brain: There is no mass, hemorrhage or extra-axial collection. The size and configuration of the ventricles and extra-axial CSF spaces are normal. There is hypoattenuation of the white matter, most commonly indicating chronic small vessel disease. Multifocal hypoattenuation in the left hemispheric white matter consistent with known areas of infarct. Vascular: No abnormal hyperdensity of the major intracranial arteries or dural venous sinuses. No intracranial atherosclerosis. Skull: The visualized skull base, calvarium and extracranial soft tissues are normal. Sinuses/Orbits: No fluid levels or advanced mucosal thickening of the visualized paranasal sinuses. No mastoid or middle ear effusion. The orbits are normal. IMPRESSION: Chronic ischemic microangiopathy without acute intracranial abnormality. Electronically Signed   By: Ulyses Jarred M.D.   On: 04/09/2019 22:44   Ct Head Wo Contrast  Result Date: 04/08/2019 CLINICAL DATA:  63 year old male with new onset confusion. MRI brain yesterday revealing multifocal acute left hemisphere infarcts. EXAM: CT HEAD WITHOUT CONTRAST TECHNIQUE: Contiguous axial images were obtained from the base of the skull through the vertex without intravenous contrast. COMPARISON:  Brain MRI 04/07/2019.  Head CT 04/06/2019, and earlier. FINDINGS: Brain: No midline shift, mass effect, or evidence of intracranial mass lesion. No acute intracranial hemorrhage identified. No ventriculomegaly. Small areas of patchy hypodensity corresponding to the watershed type restricted diffusion seen by MRI yesterday. Gray-white matter differentiation elsewhere is stable. No new cortically based infarct identified. Vascular: Calcified atherosclerosis at the skull base. No suspicious intracranial vascular hyperdensity. Skull: Negative. Sinuses/Orbits: Visualized paranasal sinuses and mastoids are stable and well pneumatized. Other: Visualized orbits and scalp soft tissues are within normal limits. IMPRESSION: 1. Expected CT appearance of the scattered small left hemisphere infarcts seen by MRI yesterday. No associated hemorrhage or mass effect. 2. No new intracranial  abnormality. Electronically Signed   By: Odessa FlemingH  Hall M.D.   On: 04/08/2019 14:00    Assessment: 63 y.o. male with history of CAD, CHF, HTN, HLD, atrial fibrillation on Xarelto, CVA admitted with right sided weakness and slurred speech in whom head ct shows a left frontal hypodensity most likely c/w subacute infarct.  Plan: 1. HgbA1c, fasting lipid panel 2. MRI, MRA  of the brain without contrast 3. PT consult, OT consult, Speech consult 4. Echocardiogram 5. Carotid dopplers: given finding of ct angio on 05/2018, consider cardiovascular consult 6. Prophylactic therapy-continue home xarelto 7. Risk factor modification 8. Telemetry monitoring 9. Frequent neuro checks  04/10/2019, 10:19 AM

## 2019-04-10 NOTE — Plan of Care (Signed)
Pt has been more lethargic than usual this shift. NP called to the bedside and new orders were placed. ICU nurse, nursing supervisor and NP called back to room because of no improvement in pt. New orders were placed. Pt NIH is now 64 and 13 at reassessment. No other signs of distress noted. Will continue to monitor.

## 2019-04-10 NOTE — Consult Note (Signed)
ANTICOAGULATION CONSULT NOTE   Pharmacy Consult for Heparin Indication: atrial fibrillation  Allergies  Allergen Reactions  . Aspirin Other (See Comments)    Told not to take because of blood thinner  . Entresto [Sacubitril-Valsartan] Other (See Comments)    Dizziness, weakness and stuttering  . Ibuprofen Nausea And Vomiting  . Tylenol [Acetaminophen] Nausea And Vomiting    Patient Measurements: Height: 5\' 11"  (180.3 cm) Weight: 220 lb 0.3 oz (99.8 kg) IBW/kg (Calculated) : 75.3 Heparin Dosing Weight: 95.8 kg  Vital Signs: Temp: 99.6 F (37.6 C) (10/11 0428) Temp Source: Rectal (10/11 0428) BP: 118/92 (10/11 0428) Pulse Rate: 111 (10/11 0428)  Labs: Recent Labs    04/08/19 0716 04/09/19 2304 04/10/19 0459  HGB 11.5* 12.2* 12.1*  HCT 36.6* 40.1 38.7*  PLT 106* 150 129*  CREATININE 1.61* 2.01* 2.02*  TROPONINIHS  --   --  145*    Estimated Creatinine Clearance: 45.6 mL/min (A) (by C-G formula based on SCr of 2.02 mg/dL (H)).   Medical History: Past Medical History:  Diagnosis Date  . CAD in native artery    a. stress echo 12/2007 abnl, EF > 55%, b. LHC 01/28/08: mLAD 30, D1 40, dLCx 70, pRCA 30, mRCA 70, mRCA lesion 2 80, PDA 90, s/p PCI/BMS to prox and distal RCA, s/p PCI/BMS to PDA; c. patient reports PCI/stenting x 2 in early 2017 at the 2018 (no records on file) d. 06/2016: cath showing patent stents along RCA and LCx with moderate 40% stenosis along the LAD.   07/2016 Carotid arterial disease (HCC)    a. 05/2018 CTA Head/neck: 80-90% stenosis @ L carotid bifurcation, extending into the prox LICA. 60% stenosis @ R carotid bifurcation. 50-75% bilateral distal cavernous carotid dzs.  . Chronic combined systolic (congestive) and diastolic (congestive) heart failure (HCC)    a. 2009 Echo: > 55%; b. 06/2016: EF 25-30%; c. 12/2017 Echo: EF 30-35%, diff HK; d. 05/2018 Echo: 30-35%, mild conc LVH, diff HK. Mild MR. Mildly dil LA/RA.  06/2018 Chronic kidney disease   . COPD (chronic  obstructive pulmonary disease) (HCC)   . Diabetes mellitus without complication (HCC)   . Gout   . Hyperlipidemia   . Hypertension   . Hypertensive heart disease   . Ischemic cardiomyopathy    a. 05/2018: 30-35%.  . Obesity   . Persistent atrial fibrillation (HCC) 01/2017   a. cardioverted 9/18 to NSR; b. 12/2017 noted to be back in Afib-outpt dccv rec; c. CHA2DS2VASc = 6-->Xarelto.  . Stroke (cerebrum) (HCC)    a. 05/2018 MRI: small patchy acute to subacute cortial and white matter infarct in the bilat cerebrum. Mild chronic small vessel ischmia; b. 05/2018 Carotid U/S: L-carotid bifurcation 80-90% stenosed, R- carotid bifurcation 60% stenosed.  . Tobacco abuse     Medications:  Medications Prior to Admission  Medication Sig Dispense Refill Last Dose  . atorvastatin (LIPITOR) 80 MG tablet Take 80 mg by mouth daily.   Past Week at Unknown time  . budesonide-formoterol (SYMBICORT) 160-4.5 MCG/ACT inhaler Inhale 2 puffs into the lungs 2 (two) times daily.   Past Week at Unknown time  . carvedilol (COREG) 6.25 MG tablet Take 1 tablet (6.25 mg total) by mouth 2 (two) times daily with a meal. 60 tablet 1 Past Week at Unknown time  . magnesium oxide (MAG-OX) 400 MG tablet Take 400 mg by mouth 2 (two) times daily.   Past Week at Unknown time  . methocarbamol (ROBAXIN) 500 MG tablet Take 500 mg  by mouth 4 (four) times daily.   Past Week at Unknown time  . nitroGLYCERIN (NITROSTAT) 0.4 MG SL tablet Place 0.4 mg under the tongue every 5 (five) minutes as needed for chest pain.   Unknown at PRN  . oxyCODONE (OXY IR/ROXICODONE) 5 MG immediate release tablet Take 5 mg by mouth 2 (two) times a day.   Past Week at Unknown time  . rivaroxaban (XARELTO) 20 MG TABS tablet Take 20 mg by mouth daily with supper.   Past Week at Unknown time  . sennosides-docusate sodium (SENOKOT-S) 8.6-50 MG tablet Take 2 tablets by mouth daily.   Past Week at Unknown time  . spironolactone (ALDACTONE) 25 MG tablet Take 1  tablet (25 mg total) by mouth daily. 90 tablet 3 Past Week at Unknown time  . Tiotropium Bromide Monohydrate 2.5 MCG/ACT AERS Inhale 2 puffs into the lungs daily.   Past Week at Unknown time  . traZODone (DESYREL) 50 MG tablet Take 50 mg by mouth at bedtime.   Past Week at Unknown time  . docusate sodium (COLACE) 100 MG capsule Take 1 capsule (100 mg total) by mouth 2 (two) times daily. (Patient not taking: Reported on 04/05/2019) 60 capsule 6 Not Taking at Unknown time  . rivaroxaban (XARELTO) 15 MG TABS tablet Take 1 tablet (15 mg total) by mouth daily with supper. (Patient not taking: Reported on 04/05/2019) 30 tablet 1 Not Taking at Unknown time   Scheduled:  . atorvastatin  80 mg Oral Daily  . budesonide (PULMICORT) nebulizer solution  0.25 mg Nebulization BID  . carvedilol  6.25 mg Oral BID WC  . diltiazem  120 mg Oral Daily  . furosemide  40 mg Intravenous Q12H  . guaiFENesin  600 mg Oral BID  . insulin aspart  0-5 Units Subcutaneous QHS  . insulin aspart  0-9 Units Subcutaneous TID WC  . insulin glargine  10 Units Subcutaneous QHS  . magnesium oxide  400 mg Oral BID  . methocarbamol  500 mg Oral QID  . polyethylene glycol  17 g Oral Daily  . senna-docusate  2 tablet Oral Daily  . tiotropium  18 mcg Inhalation Daily   Infusions:  . sodium chloride 250 mL (04/10/19 0714)  .  ceFAZolin (ANCEF) IV    . levETIRAcetam 500 mg (04/10/19 0715)   PRN: sodium chloride, bisacodyl, hydrALAZINE, HYDROmorphone (DILAUDID) injection, ipratropium-albuterol, morphine injection, nitroGLYCERIN, ondansetron (ZOFRAN) IV, ondansetron (ZOFRAN) IV, oxyCODONE, simethicone  Assessment: Pharmacy consulted to start heparin and hold rivaroxaban for possible procedure. Will use aPTT to monitor heparin as patient has been on rivaroxaban. Last dose of rivaroxaban 10/9 @1757   Goal of Therapy:  aPTT 66-102 seconds and HL 0.3-0.7 once aPTT and heparin level correlate.  Monitor platelets by anticoagulation  protocol: Yes   Plan:  Start heparin infusion at 1300 units/hr at 1800.  Check anti-Xa level in 6 hours from start of heparin and daily while on heparin Continue to monitor H&H and platelets  Oswald Hillock, PharmD, BCPS 04/10/2019,7:36 AM

## 2019-04-10 NOTE — Progress Notes (Signed)
Received report from M. Dorothea Ogle, RN.

## 2019-04-10 NOTE — Progress Notes (Signed)
CRITICAL VALUE ALERT  Critical Value:  INR 5.2  Date & Time Notied:  04/10/19 1442  Provider Notified: Dr Stark Jock  Orders Received/Actions taken: No orders at this time.

## 2019-04-11 ENCOUNTER — Encounter: Payer: Self-pay | Admitting: Vascular Surgery

## 2019-04-11 ENCOUNTER — Inpatient Hospital Stay: Payer: No Typology Code available for payment source

## 2019-04-11 LAB — APTT
aPTT: 114 seconds — ABNORMAL HIGH (ref 24–36)
aPTT: 76 seconds — ABNORMAL HIGH (ref 24–36)
aPTT: 119 seconds — ABNORMAL HIGH (ref 24–36)

## 2019-04-11 LAB — MAGNESIUM: Magnesium: 2.9 mg/dL — ABNORMAL HIGH (ref 1.7–2.4)

## 2019-04-11 LAB — CBC
HCT: 38.5 % — ABNORMAL LOW (ref 39.0–52.0)
Hemoglobin: 12 g/dL — ABNORMAL LOW (ref 13.0–17.0)
MCH: 29.3 pg (ref 26.0–34.0)
MCHC: 31.2 g/dL (ref 30.0–36.0)
MCV: 94.1 fL (ref 80.0–100.0)
Platelets: 146 10*3/uL — ABNORMAL LOW (ref 150–400)
RBC: 4.09 MIL/uL — ABNORMAL LOW (ref 4.22–5.81)
RDW: 22.2 % — ABNORMAL HIGH (ref 11.5–15.5)
WBC: 6.3 10*3/uL (ref 4.0–10.5)
nRBC: 1.6 % — ABNORMAL HIGH (ref 0.0–0.2)

## 2019-04-11 LAB — GLUCOSE, CAPILLARY
Glucose-Capillary: 117 mg/dL — ABNORMAL HIGH (ref 70–99)
Glucose-Capillary: 105 mg/dL — ABNORMAL HIGH (ref 70–99)
Glucose-Capillary: 96 mg/dL (ref 70–99)
Glucose-Capillary: 136 mg/dL — ABNORMAL HIGH (ref 70–99)
Glucose-Capillary: 105 mg/dL — ABNORMAL HIGH (ref 70–99)

## 2019-04-11 LAB — BASIC METABOLIC PANEL
Anion gap: 8 (ref 5–15)
BUN: 46 mg/dL — ABNORMAL HIGH (ref 8–23)
CO2: 22 mmol/L (ref 22–32)
Calcium: 9 mg/dL (ref 8.9–10.3)
Chloride: 111 mmol/L (ref 98–111)
Creatinine, Ser: 2.12 mg/dL — ABNORMAL HIGH (ref 0.61–1.24)
GFR calc Af Amer: 38 mL/min — ABNORMAL LOW (ref >60)
GFR calc non Af Amer: 32 mL/min — ABNORMAL LOW (ref >60)
Glucose, Bld: 111 mg/dL — ABNORMAL HIGH (ref 70–99)
Potassium: 4.2 mmol/L (ref 3.5–5.1)
Sodium: 141 mmol/L (ref 135–145)

## 2019-04-11 MED ORDER — TORSEMIDE 20 MG PO TABS
20.0000 mg | ORAL_TABLET | Freq: Every day | ORAL | Status: DC
  Administered 2019-04-11 – 2019-04-12 (×2): 20 mg via ORAL
  Filled 2019-04-11 (×2): qty 1

## 2019-04-11 MED ORDER — TAMSULOSIN HCL 0.4 MG PO CAPS
0.4000 mg | ORAL_CAPSULE | Freq: Every day | ORAL | Status: DC
  Administered 2019-04-11 – 2019-04-16 (×6): 0.4 mg via ORAL
  Filled 2019-04-11 (×6): qty 1

## 2019-04-11 MED ORDER — SODIUM CHLORIDE 0.9 % IV SOLN
250.0000 mg | Freq: Two times a day (BID) | INTRAVENOUS | Status: DC
  Administered 2019-04-11 – 2019-04-16 (×10): 250 mg via INTRAVENOUS
  Filled 2019-04-11 (×13): qty 2.5

## 2019-04-11 NOTE — Consult Note (Signed)
ANTICOAGULATION CONSULT NOTE   Pharmacy Consult for Heparin Indication: atrial fibrillation  Allergies  Allergen Reactions  . Aspirin Other (See Comments)    Told not to take because of blood thinner  . Entresto [Sacubitril-Valsartan] Other (See Comments)    Dizziness, weakness and stuttering  . Ibuprofen Nausea And Vomiting  . Tylenol [Acetaminophen] Nausea And Vomiting    Patient Measurements: Height: 5\' 11"  (180.3 cm) Weight: 220 lb 0.3 oz (99.8 kg) IBW/kg (Calculated) : 75.3 Heparin Dosing Weight: 95.8 kg  Vital Signs: Temp: 97.9 F (36.6 C) (10/12 1200) Temp Source: Oral (10/12 1200) BP: 129/107 (10/12 1816) Pulse Rate: 116 (10/12 1816)  Labs: Recent Labs    04/09/19 2304 04/10/19 0459  04/10/19 1205 04/10/19 1250 04/11/19 0043 04/11/19 0747 04/11/19 1906  HGB 12.2* 12.1*  --   --   --   --  12.0*  --   HCT 40.1 38.7*  --   --   --   --  38.5*  --   PLT 150 129*  --   --   --   --  146*  --   APTT  --   --    < > 49*  --  114* 76* 119*  LABPROT  --   --   --  46.8*  --   --   --   --   INR  --   --   --  5.2*  --   --   --   --   HEPARINUNFRC  --   --   --  >3.60*  --   --   --   --   CREATININE 2.01* 2.02*  --   --   --   --  2.12*  --   TROPONINIHS  --  145*  --   --  131*  --   --   --    < > = values in this interval not displayed.    Estimated Creatinine Clearance: 43.5 mL/min (A) (by C-G formula based on SCr of 2.12 mg/dL (H)).   Medical History: Past Medical History:  Diagnosis Date  . CAD in native artery    a. stress echo 12/2007 abnl, EF > 55%, b. LHC 01/28/08: mLAD 30, D1 40, dLCx 70, pRCA 30, mRCA 70, mRCA lesion 2 80, PDA 90, s/p PCI/BMS to prox and distal RCA, s/p PCI/BMS to PDA; c. patient reports PCI/stenting x 2 in early 2017 at the [facility] (no records on file) d. 06/2016: cath showing patent stents along RCA and LCx with moderate 40% stenosis along the LAD.   [name]. Carotid arterial disease (HCC)    a. 05/2018 CTA Head/neck: 80-90% stenosis @ L  carotid bifurcation, extending into the prox LICA. 60% stenosis @ R carotid bifurcation. 50-75% bilateral distal cavernous carotid dzs.  . Chronic combined systolic (congestive) and diastolic (congestive) heart failure (HCC)    a. 2009 Echo: > 55%; b. 06/2016: EF 25-30%; c. 12/2017 Echo: EF 30-35%, diff HK; d. 05/2018 Echo: 30-35%, mild conc LVH, diff HK. Mild MR. Mildly dil LA/RA.  [name]. Chronic kidney disease   . COPD (chronic obstructive pulmonary disease) (HCC)   . Diabetes mellitus without complication (HCC)   . Gout   . Hyperlipidemia   . Hypertension   . Hypertensive heart disease   . Ischemic cardiomyopathy    a. 05/2018: 30-35%.  . Obesity   . Persistent atrial fibrillation (HCC) 01/2017   a. cardioverted 9/18 to NSR; b. 12/2017  noted to be back in Afib-outpt dccv rec; c. CHA2DS2VASc = 6-->Xarelto.  . Stroke (cerebrum) (HCC)    a. 05/2018 MRI: small patchy acute to subacute cortial and white matter infarct in the bilat cerebrum. Mild chronic small vessel ischmia; b. 05/2018 Carotid U/S: L-carotid bifurcation 80-90% stenosed, R- carotid bifurcation 60% stenosed.  . Tobacco abuse     Medications:  Medications Prior to Admission  Medication Sig Dispense Refill Last Dose  . atorvastatin (LIPITOR) 80 MG tablet Take 80 mg by mouth daily.   Past Week at Unknown time  . budesonide-formoterol (SYMBICORT) 160-4.5 MCG/ACT inhaler Inhale 2 puffs into the lungs 2 (two) times daily.   Past Week at Unknown time  . carvedilol (COREG) 6.25 MG tablet Take 1 tablet (6.25 mg total) by mouth 2 (two) times daily with a meal. 60 tablet 1 Past Week at Unknown time  . magnesium oxide (MAG-OX) 400 MG tablet Take 400 mg by mouth 2 (two) times daily.   Past Week at Unknown time  . methocarbamol (ROBAXIN) 500 MG tablet Take 500 mg by mouth 4 (four) times daily.   Past Week at Unknown time  . nitroGLYCERIN (NITROSTAT) 0.4 MG SL tablet Place 0.4 mg under the tongue every 5 (five) minutes as needed for chest pain.    Unknown at PRN  . oxyCODONE (OXY IR/ROXICODONE) 5 MG immediate release tablet Take 5 mg by mouth 2 (two) times a day.   Past Week at Unknown time  . rivaroxaban (XARELTO) 20 MG TABS tablet Take 20 mg by mouth daily with supper.   Past Week at Unknown time  . sennosides-docusate sodium (SENOKOT-S) 8.6-50 MG tablet Take 2 tablets by mouth daily.   Past Week at Unknown time  . spironolactone (ALDACTONE) 25 MG tablet Take 1 tablet (25 mg total) by mouth daily. 90 tablet 3 Past Week at Unknown time  . Tiotropium Bromide Monohydrate 2.5 MCG/ACT AERS Inhale 2 puffs into the lungs daily.   Past Week at Unknown time  . traZODone (DESYREL) 50 MG tablet Take 50 mg by mouth at bedtime.   Past Week at Unknown time  . docusate sodium (COLACE) 100 MG capsule Take 1 capsule (100 mg total) by mouth 2 (two) times daily. (Patient not taking: Reported on 04/05/2019) 60 capsule 6 Not Taking at Unknown time  . rivaroxaban (XARELTO) 15 MG TABS tablet Take 1 tablet (15 mg total) by mouth daily with supper. (Patient not taking: Reported on 04/05/2019) 30 tablet 1 Not Taking at Unknown time   Scheduled:  . atorvastatin  80 mg Oral Daily  . carvedilol  6.25 mg Oral BID WC  . Chlorhexidine Gluconate Cloth  6 each Topical Q0600  . guaiFENesin  600 mg Oral BID  . insulin aspart  0-5 Units Subcutaneous QHS  . insulin aspart  0-9 Units Subcutaneous TID WC  . insulin glargine  10 Units Subcutaneous QHS  . magnesium oxide  400 mg Oral BID  . methocarbamol  500 mg Oral QID  . polyethylene glycol  17 g Oral Daily  . senna-docusate  2 tablet Oral Daily  . tamsulosin  0.4 mg Oral Daily  . torsemide  20 mg Oral Daily   Infusions:  . sodium chloride Stopped (04/10/19 0742)  . heparin 1,450 Units/hr (04/11/19 1700)  . levETIRAcetam 250 mg (04/11/19 1816)   PRN: sodium chloride, bisacodyl, hydrALAZINE, nitroGLYCERIN, ondansetron (ZOFRAN) IV, ondansetron (ZOFRAN) IV, simethicone  Assessment: Pharmacy consulted to start heparin  and hold rivaroxaban for possible procedure.  Will use aPTT to monitor heparin as patient has been on rivaroxaban. Last dose of rivaroxaban 10/9 @1757   Goal of Therapy:  aPTT 66-102 seconds and HL 0.3-0.7 once aPTT and heparin level correlate.  Monitor platelets by anticoagulation protocol: Yes   Plan:  10/12 1906 APTT 119, supratherapeutic. Will decrease heparin drip to 1350 units/hr and check APTT at 0300.  Tawnya Crook, PharmD 04/11/2019,8:28 PM

## 2019-04-11 NOTE — Progress Notes (Signed)
[facility]  Daily Progress Note   Subjective  - 3 Days Post-Op  Transferred to the ICU for increased work of breathing and mental status changes yesterday.  Currently off oxygen but remains tachycardic.  Answer some questions appropriately but mental status remains somewhat poor.  Objective Vitals:   04/11/19 1000 04/11/19 1100 04/11/19 1200 04/11/19 1300  BP: (!) 146/87 (!) 127/103 (!) 134/98 139/88  Pulse: (!) 117 (!) 116 (!) 117 (!) 116  Resp: (!) 8 (!) 9 14 11   Temp:   97.9 F (36.6 C)   TempSrc:   Oral   SpO2: 95% 99% 97% 98%  Weight:      Height:        Intake/Output Summary (Last 24 hours) at 04/11/2019 1438 Last data filed at 04/11/2019 1300 Gross per 24 hour  Intake 591.47 ml  Output 700 ml  Net -108.53 ml    PULM  slightly labored but not tachypneic at this point CV  tachycardic but regular VASC  access site is clean, dry, and intact  Laboratory CBC    Component Value Date/Time   WBC 6.3 04/11/2019 0747   HGB 12.0 (L) 04/11/2019 0747   HGB 12.5 (L) 12/11/2012 1214   HCT 38.5 (L) 04/11/2019 0747   HCT 38.4 (L) 12/11/2012 1214   PLT 146 (L) 04/11/2019 0747   PLT 167 12/11/2012 1214    BMET    Component Value Date/Time   NA 141 04/11/2019 0747   NA 137 09/09/2018 1059   NA 141 12/11/2012 1214   K 4.2 04/11/2019 0747   K 4.2 12/11/2012 1214   CL 111 04/11/2019 0747   CL 108 (H) 12/11/2012 1214   CO2 22 04/11/2019 0747   CO2 28 12/11/2012 1214   GLUCOSE 111 (H) 04/11/2019 0747   GLUCOSE 112 (H) 12/11/2012 1214   BUN 46 (H) 04/11/2019 0747   BUN 24 09/09/2018 1059   BUN 18 12/11/2012 1214   CREATININE 2.12 (H) 04/11/2019 0747   CREATININE 1.26 12/11/2012 1214   CALCIUM 9.0 04/11/2019 0747   CALCIUM 8.8 12/11/2012 1214   GFRNONAA 32 (L) 04/11/2019 0747   GFRNONAA >60 12/11/2012 1214   GFRAA 38 (L) 04/11/2019 0747   GFRAA >60 12/11/2012 1214    Assessment/Planning: POD #3 s/p carotid angiogram   Patient  transferred to the ICU yesterday with increased work of breathing and mental status changes.  Seems to be breathing a bit better although tachycardia persists.  Mental status remains relatively poor although he answer some questions.  Unable to get his stress test today with mental status changes.  Would likely be moderate risk for surgery but his lesion is not optimal for carotid stenting.  Still considering carotid endarterectomy for near occlusive stenosis of the left internal carotid artery 1 day this week if his medical status and mental status allow.  Will follow    Leotis Pain  04/11/2019, 2:38 PM

## 2019-04-11 NOTE — Consult Note (Signed)
ANTICOAGULATION CONSULT NOTE   Pharmacy Consult for Heparin Indication: atrial fibrillation  Allergies  Allergen Reactions  . Aspirin Other (See Comments)    Told not to take because of blood thinner  . Entresto [Sacubitril-Valsartan] Other (See Comments)    Dizziness, weakness and stuttering  . Ibuprofen Nausea And Vomiting  . Tylenol [Acetaminophen] Nausea And Vomiting    Patient Measurements: Height: 5\' 11"  (180.3 cm) Weight: 220 lb 0.3 oz (99.8 kg) IBW/kg (Calculated) : 75.3 Heparin Dosing Weight: 95.8 kg  Vital Signs: Temp: 98.3 F (36.8 C) (10/12 0000) Temp Source: Axillary (10/12 0000) BP: 126/103 (10/12 0000) Pulse Rate: 97 (10/12 0000)  Labs: Recent Labs    04/08/19 0716 04/09/19 2304 04/10/19 0459 04/10/19 1205 04/10/19 1250  HGB 11.5* 12.2* 12.1*  --   --   HCT 36.6* 40.1 38.7*  --   --   PLT 106* 150 129*  --   --   APTT  --   --   --  49*  --   LABPROT  --   --   --  46.8*  --   INR  --   --   --  5.2*  --   HEPARINUNFRC  --   --   --  >3.60*  --   CREATININE 1.61* 2.01* 2.02*  --   --   TROPONINIHS  --   --  145*  --  131*    Estimated Creatinine Clearance: 45.6 mL/min (A) (by C-G formula based on SCr of 2.02 mg/dL (H)).   Medical History: Past Medical History:  Diagnosis Date  . CAD in native artery    a. stress echo 12/2007 abnl, EF > 55%, b. LHC 01/28/08: mLAD 30, D1 40, dLCx 70, pRCA 30, mRCA 70, mRCA lesion 2 80, PDA 90, s/p PCI/BMS to prox and distal RCA, s/p PCI/BMS to PDA; c. patient reports PCI/stenting x 2 in early 2017 at the [facility] (no records on file) d. 06/2016: cath showing patent stents along RCA and LCx with moderate 40% stenosis along the LAD.   [name]. Carotid arterial disease (HCC)    a. 05/2018 CTA Head/neck: 80-90% stenosis @ L carotid bifurcation, extending into the prox LICA. 60% stenosis @ R carotid bifurcation. 50-75% bilateral distal cavernous carotid dzs.  . Chronic combined systolic (congestive) and diastolic (congestive) heart  failure (HCC)    a. 2009 Echo: > 55%; b. 06/2016: EF 25-30%; c. 12/2017 Echo: EF 30-35%, diff HK; d. 05/2018 Echo: 30-35%, mild conc LVH, diff HK. Mild MR. Mildly dil LA/RA.  [name]. Chronic kidney disease   . COPD (chronic obstructive pulmonary disease) (HCC)   . Diabetes mellitus without complication (HCC)   . Gout   . Hyperlipidemia   . Hypertension   . Hypertensive heart disease   . Ischemic cardiomyopathy    a. 05/2018: 30-35%.  . Obesity   . Persistent atrial fibrillation (HCC) 01/2017   a. cardioverted 9/18 to NSR; b. 12/2017 noted to be back in Afib-outpt dccv rec; c. CHA2DS2VASc = 6-->Xarelto.  . Stroke (cerebrum) (HCC)    a. 05/2018 MRI: small patchy acute to subacute cortial and white matter infarct in the bilat cerebrum. Mild chronic small vessel ischmia; b. 05/2018 Carotid U/S: L-carotid bifurcation 80-90% stenosed, R- carotid bifurcation 60% stenosed.  . Tobacco abuse     Medications:  Medications Prior to Admission  Medication Sig Dispense Refill Last Dose  . atorvastatin (LIPITOR) 80 MG tablet Take 80 mg by mouth daily.   Past  Week at Unknown time  . budesonide-formoterol (SYMBICORT) 160-4.5 MCG/ACT inhaler Inhale 2 puffs into the lungs 2 (two) times daily.   Past Week at Unknown time  . carvedilol (COREG) 6.25 MG tablet Take 1 tablet (6.25 mg total) by mouth 2 (two) times daily with a meal. 60 tablet 1 Past Week at Unknown time  . magnesium oxide (MAG-OX) 400 MG tablet Take 400 mg by mouth 2 (two) times daily.   Past Week at Unknown time  . methocarbamol (ROBAXIN) 500 MG tablet Take 500 mg by mouth 4 (four) times daily.   Past Week at Unknown time  . nitroGLYCERIN (NITROSTAT) 0.4 MG SL tablet Place 0.4 mg under the tongue every 5 (five) minutes as needed for chest pain.   Unknown at PRN  . oxyCODONE (OXY IR/ROXICODONE) 5 MG immediate release tablet Take 5 mg by mouth 2 (two) times a day.   Past Week at Unknown time  . rivaroxaban (XARELTO) 20 MG TABS tablet Take 20 mg by mouth  daily with supper.   Past Week at Unknown time  . sennosides-docusate sodium (SENOKOT-S) 8.6-50 MG tablet Take 2 tablets by mouth daily.   Past Week at Unknown time  . spironolactone (ALDACTONE) 25 MG tablet Take 1 tablet (25 mg total) by mouth daily. 90 tablet 3 Past Week at Unknown time  . Tiotropium Bromide Monohydrate 2.5 MCG/ACT AERS Inhale 2 puffs into the lungs daily.   Past Week at Unknown time  . traZODone (DESYREL) 50 MG tablet Take 50 mg by mouth at bedtime.   Past Week at Unknown time  . docusate sodium (COLACE) 100 MG capsule Take 1 capsule (100 mg total) by mouth 2 (two) times daily. (Patient not taking: Reported on 04/05/2019) 60 capsule 6 Not Taking at Unknown time  . rivaroxaban (XARELTO) 15 MG TABS tablet Take 1 tablet (15 mg total) by mouth daily with supper. (Patient not taking: Reported on 04/05/2019) 30 tablet 1 Not Taking at Unknown time   Scheduled:  . atorvastatin  80 mg Oral Daily  . budesonide (PULMICORT) nebulizer solution  0.25 mg Nebulization BID  . carvedilol  6.25 mg Oral BID WC  . Chlorhexidine Gluconate Cloth  6 each Topical Q0600  . diltiazem  120 mg Oral Daily  . furosemide  40 mg Intravenous Q12H  . guaiFENesin  600 mg Oral BID  . insulin aspart  0-5 Units Subcutaneous QHS  . insulin aspart  0-9 Units Subcutaneous TID WC  . insulin glargine  10 Units Subcutaneous QHS  . magnesium oxide  400 mg Oral BID  . methocarbamol  500 mg Oral QID  . polyethylene glycol  17 g Oral Daily  . senna-docusate  2 tablet Oral Daily  . tiotropium  18 mcg Inhalation Daily   Infusions:  . sodium chloride Stopped (04/10/19 0742)  .  ceFAZolin (ANCEF) IV    . heparin 1,300 Units/hr (04/11/19 0000)  . levETIRAcetam Stopped (04/10/19 1852)   PRN: sodium chloride, bisacodyl, hydrALAZINE, HYDROmorphone (DILAUDID) injection, ipratropium-albuterol, morphine injection, nitroGLYCERIN, ondansetron (ZOFRAN) IV, ondansetron (ZOFRAN) IV, oxyCODONE, simethicone  Assessment: Pharmacy  consulted to start heparin and hold rivaroxaban for possible procedure. Will use aPTT to monitor heparin as patient has been on rivaroxaban. Last dose of rivaroxaban 10/9 @1757   Goal of Therapy:  aPTT 66-102 seconds and HL 0.3-0.7 once aPTT and heparin level correlate.  Monitor platelets by anticoagulation protocol: Yes   Plan:  10/12 @ 0000 aPTT 49 seconds subtherapeutic. Will increase rate to 1450 units/hr  and will recheck aPTT @ 0800, CBC stable will continue to monitor.  Thomasene Ripple, PharmD, BCPS 04/11/2019,2:00 AM

## 2019-04-11 NOTE — Progress Notes (Signed)
Progress Note  Patient Name: [name] Date of Encounter: 04/11/2019  Primary Cardiologist: Myton   Transferred to ICU on 10/11 with [place] respirations and AMS. No chest pain. [place] respirations persist this morning. Renal function slightly worse.   Inpatient Medications    Scheduled Meds:  atorvastatin  80 mg Oral Daily   budesonide (PULMICORT) nebulizer solution  0.25 mg Nebulization BID   carvedilol  6.25 mg Oral BID WC   Chlorhexidine Gluconate Cloth  6 each Topical Q0600   diltiazem  120 mg Oral Daily   furosemide  40 mg Intravenous Q12H   guaiFENesin  600 mg Oral BID   insulin aspart  0-5 Units Subcutaneous QHS   insulin aspart  0-9 Units Subcutaneous TID WC   insulin glargine  10 Units Subcutaneous QHS   magnesium oxide  400 mg Oral BID   methocarbamol  500 mg Oral QID   polyethylene glycol  17 g Oral Daily   senna-docusate  2 tablet Oral Daily   tiotropium  18 mcg Inhalation Daily   Continuous Infusions:  sodium chloride Stopped (04/10/19 0742)    ceFAZolin (ANCEF) IV     heparin 1,450 Units/hr (04/11/19 1022)   levETIRAcetam Stopped (04/11/19 0821)   PRN Meds: sodium chloride, bisacodyl, hydrALAZINE, ipratropium-albuterol, morphine injection, nitroGLYCERIN, ondansetron (ZOFRAN) IV, ondansetron (ZOFRAN) IV, simethicone   Vital Signs    Vitals:   04/11/19 0800 04/11/19 0839 04/11/19 0900 04/11/19 1000  BP: (!) 136/104  (!) 140/107 (!) 146/87  Pulse: (!) 115  (!) 116 (!) 117  Resp: (!) 27  10 (!) 8  Temp:      TempSrc:      SpO2: 97% 98% 100% 95%  Weight:      Height:        Intake/Output Summary (Last 24 hours) at 04/11/2019 1030 Last data filed at 04/11/2019 1000 Gross per 24 hour  Intake 794.3 ml  Output 1700 ml  Net -905.7 ml   Filed Weights   04/05/19 0618  Weight: 99.8 kg    Telemetry    Atrial flutter with variable AV block, 80s bpm - Personally Reviewed  ECG      Atrial flutter with variable AV block, 88 bpm, left axis deviation, low voltage QRS, poor R wave progression along the precordial leads, prolonged QT, nonspecific lateral st/t changes  - Personally Reviewed  Physical Exam   GEN: No acute distress.   Neck: No JVD. Cardiac: Tachycardic irregular, no murmurs, rubs, or gallops.  Respiratory: Clear to auscultation bilaterally.  GI: Soft, nontender, non-distended.   MS: No edema; No deformity. Neuro:  Alert and oriented x 3; Nonfocal. Somnolent.  Psych: Normal affect.  Labs    Chemistry Recent Labs  Lab 04/04/19 2041  04/09/19 2304 04/10/19 0459 04/11/19 0747  NA 135   < > 138 139 141  K 3.6   < > 5.2* 4.7 4.2  CL 99   < > 108 109 111  CO2 24   < > 18* 20* 22  GLUCOSE 116*   < > 134* 121* 111*  BUN 26*   < > 38* 39* 46*  CREATININE 1.45*   < > 2.01* 2.02* 2.12*  CALCIUM 9.8   < > 9.1 9.0 9.0  PROT 8.5*  --   --   --   --   ALBUMIN 4.2  --   --   --   --   AST 30  --   --   --   --  ALT 35  --   --   --   --   ALKPHOS 209*  --   --   --   --   BILITOT 1.8*  --   --   --   --   GFRNONAA 51*   < > 35* 34* 32*  GFRAA 59*   < > 40* 40* 38*  ANIONGAP 12   < > 12 10 8    < > = values in this interval not displayed.     Hematology Recent Labs  Lab 04/09/19 2304 04/10/19 0459 04/11/19 0747  WBC 6.5 5.9 6.3  RBC 4.20* 4.15* 4.09*  HGB 12.2* 12.1* 12.0*  HCT 40.1 38.7* 38.5*  MCV 95.5 93.3 94.1  MCH 29.0 29.2 29.3  MCHC 30.4 31.3 31.2  RDW 21.2* 20.9* 22.2*  PLT 150 129* 146*    Cardiac EnzymesNo results for input(s): TROPONINI in the last 168 hours. No results for input(s): TROPIPOC in the last 168 hours.   BNP Recent Labs  Lab 04/10/19 0459  BNP 1,018.0*     DDimer No results for input(s): DDIMER in the last 168 hours.   Radiology    Dg Chest 1 View  Result Date: 04/09/2019 IMPRESSION: Worsening congestive heart failure. Electronically Signed   By: 06/09/2019 M.D.   On: 04/09/2019 22:35   Dg  Chest 1 View  Result Date: 04/09/2019 IMPRESSION: New pulmonary edema. Cardiomegaly. Atherosclerosis. Electronically Signed   By: 06/09/2019 M.D.   On: 04/09/2019 13:01   Ct Head Wo Contrast  Result Date: 04/09/2019 IMPRESSION: Chronic ischemic microangiopathy without acute intracranial abnormality. Electronically Signed   By: 06/09/2019 M.D.   On: 04/09/2019 22:44    Cardiac Studies   2D echo 04/05/2019: 1. Left ventricular ejection fraction, by visual estimation, is 30 to 35%. The left ventricle has moderately decreased function. Left ventricular septal wall thickness was moderately increased. Moderately increased left ventricular posterior wall  thickness. There is moderately increased left ventricular hypertrophy.  2. Global right ventricle has normal systolic function.The right ventricular size is mildly enlarged. No increase in right ventricular wall thickness.  3. Left atrial size was mild-moderately dilated.  4. Right atrial size was mildly dilated.  5. The mitral valve is degenerative. Mild mitral valve regurgitation.  6. The tricuspid valve is not well visualized. Tricuspid valve regurgitation is mild.  7. The aortic valve was not well visualized Aortic valve regurgitation is trivial by color flow Doppler. Mild aortic valve stenosis.  8. The pulmonic valve was not well visualized. Pulmonic valve regurgitation is trivial by color flow Doppler.  9. The aortic root was not well visualized. 10. The interatrial septum was not assessed.  Patient Profile     63 y.o. male with history of CAD status post prior stenting to the RCA andrPDA,HFrEF with an EF of 30 to 35% by echo in 05/2018 due to ICM, persistent A. fib/flutter on Xarelto,carotid artery disease status post recent stroke in12/2019 pending right sided CEA, CKD stage II,COPD secondary to tobacco abuse, diabetes, hypertension, hyperlipidemia, and obesity admitted with acute CVA with noted near occlusion of the left  ICA as noted on CTA neck.   Assessment & Plan    1. Perioperative cardiac risk evaluation: -He denies any symptoms concerning for angina -After discussion with MD, we will cancel stress test given [facility] respirations -He will be moderate risk for noncardiac procedure regardless, no further testing or intervention will reduce this risk  2. CAD involving  the native coronary arteries: -No symptoms concerning for angina -Has been on Xarelto in place of ASA -Lipitor  3. Persistent Afib/flutter: -Ventricular rates are reasonably controlled without AV nodal blocking medications currently -Heparin gtt in place of Xarelto while admitted, will resume DOAC prior to admission -Currently on diltiazem, given his cardiomyopathy, will discontinue this -He has been continued on PTA Coreg, if needed for added rate control, can change to metoprolol  4. CVA: -Permissive hypertension -Neurology on board  -Pending left-sided CEA this admission -Lipitor  5. Carotid artery disease: -Ambiguous CTA neck/carotid artery ultrasound -Vascular surgery is planning for CEA   6. HFrEF secondary to ICM: -He does not appear grossly volume up -Slight bump in BUN/SCr this morning -Hold IV Lasix this afternoon (has already received IV Lasix this morning)  7. Acute on CKD stage II: -Slight worsening of BUN/SCr this morning -Hold IV Lasix this afternoon   8. [place] respirations: -Transferred to the ICU on 10/11 -High risk for intubation -Per PCCM   For questions or updates, please contact [facility] Please consult [email or web address] for contact info under Cardiology/STEMI.    Signed, Eula Listenyan Iman Reinertsen, PA-C [facility] Pager: [phone] 04/11/2019, 10:30 AM

## 2019-04-11 NOTE — Progress Notes (Signed)
Inpatient Rehabilitation Admissions Coordinator  Inpatient rehab consult received. Noted patient with medical decline and transferred to ICU over the weekend. I will follow his progress to assist with planning dispo when appropriate.  Danne Baxter, RN, MSN Rehab Admissions Coordinator [phone] 04/11/2019 10:25 AM

## 2019-04-11 NOTE — Progress Notes (Signed)
[employer] - [place] at [facility]     PATIENT NAME: Shreyansh Tiffany    MR#:  [id number]  DATE OF BIRTH:  05/09/1956  SUBJECTIVE:  Patient had to be transferred to ICU yesterday due to worsening shortness of breath.  Diuresing well with IV Lasix.  Feels better this morning.  REVIEW OF SYSTEMS:    Review of Systems  Constitutional: Negative for chills and fever.  HENT: Negative for congestion and tinnitus.   Eyes: Negative for blurred vision and double vision.  Respiratory: Negative for cough, shortness of breath and wheezing.   Cardiovascular: Negative for chest pain, orthopnea and PND.  Gastrointestinal: Negative for abdominal pain, diarrhea, nausea and vomiting.  Genitourinary: Negative for dysuria and hematuria.  Neurological: Positive for weakness (Right Upper Ext.  ). Negative for dizziness, sensory change and focal weakness.  All other systems reviewed and are negative.   Nutrition: Dysphagia III Tolerating Diet: Yes Tolerating PT: Eval noted.    DRUG ALLERGIES:   Allergies  Allergen Reactions  . Aspirin Other (See Comments)    Told not to take because of blood thinner  . Entresto [Sacubitril-Valsartan] Other (See Comments)    Dizziness, weakness and stuttering  . Ibuprofen Nausea And Vomiting  . Tylenol [Acetaminophen] Nausea And Vomiting    VITALS:  Blood pressure 139/88, pulse (!) 116, temperature 97.9 F (36.6 C), temperature source Oral, resp. rate 11, height 5\' 11"  (1.803 m), weight 99.8 kg, SpO2 98 %.  PHYSICAL EXAMINATION:   Physical Exam  GENERAL:  63 y.o.-year-old patient lying in bed in no acute distress.  EYES: Pupils equal, round, reactive to light and accommodation. No scleral icterus. Extraocular muscles intact.  HEENT: Head atraumatic, normocephalic. Oropharynx and nasopharynx clear.  NECK:  Supple, no jugular venous distention. No thyroid enlargement, no tenderness.  LUNGS: Normal breath sounds bilaterally, no wheezing,  rales, rhonchi. No use of accessory muscles of respiration.  CARDIOVASCULAR: S1, S2 normal. No murmurs, rubs, or gallops.  ABDOMEN: Soft, nontender, nondistended. Bowel sounds present. No organomegaly or mass.  EXTREMITIES: No cyanosis, clubbing or edema b/l.    NEUROLOGIC: Cranial nerves II through XII are intact. Right upper Ext weakness with strength of 1/5.  Global weakness with 3-4/5 Strength apart from right upper extremity PSYCHIATRIC: The patient is alert and oriented x 3.  SKIN: No obvious rash, lesion, or ulcer.    LABORATORY PANEL:   CBC Recent Labs  Lab 04/11/19 0747  WBC 6.3  HGB 12.0*  HCT 38.5*  PLT 146*   ------------------------------------------------------------------------------------------------------------------  Chemistries  Recent Labs  Lab 04/04/19 2041  04/11/19 0747  NA 135   < > 141  K 3.6   < > 4.2  CL 99   < > 111  CO2 24   < > 22  GLUCOSE 116*   < > 111*  BUN 26*   < > 46*  CREATININE 1.45*   < > 2.12*  CALCIUM 9.8   < > 9.0  MG  --    < > 2.9*  AST 30  --   --   ALT 35  --   --   ALKPHOS 209*  --   --   BILITOT 1.8*  --   --    < > = values in this interval not displayed.   ------------------------------------------------------------------------------------------------------------------  Cardiac Enzymes No results for input(s): TROPONINI in the last 168 hours. ------------------------------------------------------------------------------------------------------------------  RADIOLOGY:  Dg Chest 1 View  Result Date: 04/09/2019 CLINICAL DATA:  Shortness  of breath EXAM: CHEST  1 VIEW COMPARISON:  04/04/2019 FINDINGS: There is persistent cardiomegaly with worsening pulmonary edema and interstitial lung markings. No pneumothorax. There are probable small bilateral pleural effusions, right greater than left. IMPRESSION: Worsening congestive heart failure. Electronically Signed   By: Katherine Mantlehristopher  Green M.D.   On: 04/09/2019 22:35   Ct  Head Wo Contrast  Result Date: 04/09/2019 CLINICAL DATA:  Encephalopathy EXAM: CT HEAD WITHOUT CONTRAST TECHNIQUE: Contiguous axial images were obtained from the base of the skull through the vertex without intravenous contrast. COMPARISON:  04/08/2019 FINDINGS: Brain: There is no mass, hemorrhage or extra-axial collection. The size and configuration of the ventricles and extra-axial CSF spaces are normal. There is hypoattenuation of the white matter, most commonly indicating chronic small vessel disease. Multifocal hypoattenuation in the left hemispheric white matter consistent with known areas of infarct. Vascular: No abnormal hyperdensity of the major intracranial arteries or dural venous sinuses. No intracranial atherosclerosis. Skull: The visualized skull base, calvarium and extracranial soft tissues are normal. Sinuses/Orbits: No fluid levels or advanced mucosal thickening of the visualized paranasal sinuses. No mastoid or middle ear effusion. The orbits are normal. IMPRESSION: Chronic ischemic microangiopathy without acute intracranial abnormality. Electronically Signed   By: Deatra RobinsonKevin  Herman M.D.   On: 04/09/2019 22:44   Dg Chest Port 1 View  Result Date: 04/11/2019 CLINICAL DATA:  Pulmonary disease. Heart disease, hypertension, COPD EXAM: PORTABLE CHEST 1 VIEW COMPARISON:  04/09/2019 FINDINGS: Cardiomegaly. Bilateral interstitial and airspace opacities have slightly improved since prior study, likely improving edema. No effusions or acute bony abnormality. IMPRESSION: Cardiomegaly, improving CHF. Electronically Signed   By: Charlett NoseKevin  Dover M.D.   On: 04/11/2019 11:34     ASSESSMENT AND PLAN:   63 year old male with past medical history of persistent atrial fibrillation, ischemic cardiomyopathy, hypertension, hyperlipidemia, history of gout, diabetes, COPD, chronic combined systolic diastolic CHF, carotid artery disease who presented to the hospital due to right upper extremity weakness and noted to  have an acute CVA.  1.  Acute/subacute CVA-this was noted on the CT scan of the head on admission. -Patient was refusing MRI yesterday but agreed to it subsequently which showed Multiple acute infarcts within the left cerebral hemisphere involving the cortex as well as subcortical and deep periventricular white matter.  Patient with history of persistent atrial fibrillation - Patient being followed by neurologist.  I discussed case with him.  Appreciate input.  Recommendation is to continue Xarelto.  Neurologist will make a decision if adding low-dose aspirin is indicated or not after vascular intervention Patient has previous history of carotid artery stenosis and is refused treatment in the past. - CTA of the neck recently showing Severe soft and calcified plaque within the left carotid bifurcation and proximal ICA. Near complete occlusion versus complete occlusion with immediate reconstitution within the proximal left ICA. - cont. PT and OT as tolerated.  Patient seen by speech therapist and placed on pured diet with nectar thickened liquids.  2.  Carotid artery stenosis-patient has previous history of left-sided carotid stenosis and has been noncompliant with intervention as per vascular surgery. - CTA neck recently showed Severe soft and calcified plaque within the left carotid bifurcation and proximal ICA. Near complete occlusion versus complete occlusion with immediate reconstitution within the proximal left ICA. -Vascular surgery following patient.  Patient had a cerebral angio on 10/09 with string sign ( greater than 90 % of left ICA.  Plan is for revascularization possibly with carotid endarterectomy after the weekend on Tuesday  pending cardiac preop evaluation.  To continue anticoagulation for now.  Patient was previously on Xarelto.  I discussed with vascular surgeon and patient was transitioned from Xarelto to heparin drip.  Continue statins. Preop cardiology clearance already  requested.  Vascular surgery service following patient with plans for possible carotid endarterectomy once patient is cleared by cardiology service.  Stress test which was ordered by cardiology was consulted today due to mental status change. Would not control patient BP very tightly given his atherosclerosis to allow for some brain perfusion. Keep head flat or at least 15 degrees to allow for brain perfusion  3. AMS/Encephalopathy ; improved  Patient had an episode of confusion on 04/08/2019.  Was reevaluated by neurology.  Neurology thinks this could possibly be seizures.  Repeat CT head done reviewed expected CT appearance of the scattered small left hemisphere infarcts seen by MRI previously.  Patient started on Keppra.  Neurology following.  4. Essential Hypertension - cont. Coreg, Aldactone  5.  Acute on chronic systolic CHF exacerbation.  Patient had to be transferred to ICU on 04/10/2019 due to worsening shortness of breath.  Diuresing well with IV Lasix.  BiPAP as needed.  Continue Coreg.  Mildly elevated troponin possibly due to demand ischemia.  Stress test canceled this morning due to mental status change. Cardiology following patient.Patient had recent 2D echocardiogram done on 04/05/2019 which revealed ejection fraction of 30 to 35%.  Also revealed moderately increased left ventricular hypertrophy.  6. Hyperlipidemia - cont. Atorvastatin  7. Hx of COPD - no acute exacerbation.  - cont. Symbicort, duonebs as needed, Spiriva.   8.  Diabetes type 2 with hyperglycemia- patient's A1c was as high as 8. - Continue sliding scale insulin, low dose Lantus.  BS stable.    - await DM Coordinator input.    DVT prophylaxis; patient now on heparin drip  All the records are reviewed and case discussed with Care Management/Social Worker. Management plans discussed with the patient, family and they are in agreement. I called and updated patient's wife previously on treatment plans as outlined  above and all questions were answered.  CODE STATUS: Full code  TOTAL TIME TAKING CARE OF THIS PATIENT: 33 minutes.   POSSIBLE D/C IN 4 DAYS, DEPENDING ON CLINICAL CONDITION.   Pace Lamadrid M.D on 04/11/2019 at 3:53 PM  Between 7am to 6pm - Pager - [phone]  After 6pm go to [email or web address] - [facility]  Office  [phone]  CC: Primary care physician; [facility] [place]

## 2019-04-11 NOTE — Progress Notes (Signed)
Pt refusing to wear oxygen or Bi-PAP.

## 2019-04-11 NOTE — Consult Note (Signed)
ANTICOAGULATION CONSULT NOTE   Pharmacy Consult for Heparin Indication: atrial fibrillation  Patient Measurements: Height: 5\' 11"  (180.3 cm) Weight: 220 lb 0.3 oz (99.8 kg) IBW/kg (Calculated) : 75.3 Heparin Dosing Weight: 95.8 kg  Vital Signs: Temp: 97.9 F (36.6 C) (10/12 0400) Temp Source: Axillary (10/12 0400) BP: 148/101 (10/12 0700) Pulse Rate: 93 (10/12 0600)  Labs: Recent Labs    04/09/19 2304 04/10/19 0459 04/10/19 1205 04/10/19 1250 04/11/19 0043  HGB 12.2* 12.1*  --   --   --   HCT 40.1 38.7*  --   --   --   PLT 150 129*  --   --   --   APTT  --   --  49*  --  114*  LABPROT  --   --  46.8*  --   --   INR  --   --  5.2*  --   --   HEPARINUNFRC  --   --  >3.60*  --   --   CREATININE 2.01* 2.02*  --   --   --   TROPONINIHS  --  145*  --  131*  --     Estimated Creatinine Clearance: 45.6 mL/min (A) (by C-G formula based on SCr of 2.02 mg/dL (H)).   Medical History: Past Medical History:  Diagnosis Date  . CAD in native artery    a. stress echo 12/2007 abnl, EF > 55%, b. LHC 01/28/08: mLAD 30, D1 40, dLCx 84, pRCA 30, mRCA 70, mRCA lesion 2 80, PDA 90, s/p PCI/BMS to prox and distal RCA, s/p PCI/BMS to PDA; c. patient reports PCI/stenting x 2 in early 2017 at the [place] (no records on file) d. 06/2016: cath showing patent stents along RCA and LCx with moderate 40% stenosis along the LAD.   [name] Carotid arterial disease ([place])    a. 05/2018 CTA Head/neck: 80-90% stenosis @ L carotid bifurcation, extending into the prox LICA. 60% stenosis @ R carotid bifurcation. 50-75% bilateral distal cavernous carotid dzs.  . Chronic combined systolic (congestive) and diastolic (congestive) heart failure ([place])    a. 2009 Echo: > 55%; b. 06/2016: EF 25-30%; c. 12/2017 Echo: EF 30-35%, diff HK; d. 05/2018 Echo: 30-35%, mild conc LVH, diff HK. Mild MR. Mildly dil LA/RA.  [name] Chronic kidney disease   . COPD (chronic obstructive pulmonary disease) ([place])   . Diabetes mellitus without  complication ([place])   . Gout   . Hyperlipidemia   . Hypertension   . Hypertensive heart disease   . Ischemic cardiomyopathy    a. 05/2018: 30-35%.  . Obesity   . Persistent atrial fibrillation ([place]) 01/2017   a. cardioverted 9/18 to NSR; b. 12/2017 noted to be back in Afib-outpt dccv rec; c. CHA2DS2VASc = 6-->Xarelto.  . Stroke (cerebrum) ([place])    a. 05/2018 MRI: small patchy acute to subacute cortial and white matter infarct in the bilat cerebrum. Mild chronic small vessel ischmia; b. 05/2018 Carotid U/S: L-carotid bifurcation 80-90% stenosed, R- carotid bifurcation 60% stenosed.  . Tobacco abuse     Medications:  Medications Prior to Admission  Medication Sig Dispense Refill Last Dose  . atorvastatin (LIPITOR) 80 MG tablet Take 80 mg by mouth daily.   Past Week at Unknown time  . budesonide-formoterol (SYMBICORT) 160-4.5 MCG/ACT inhaler Inhale 2 puffs into the lungs 2 (two) times daily.   Past Week at Unknown time  . carvedilol (COREG) 6.25 MG tablet Take 1 tablet (6.25 mg total) by mouth 2 (two)  times daily with a meal. 60 tablet 1 Past Week at Unknown time  . magnesium oxide (MAG-OX) 400 MG tablet Take 400 mg by mouth 2 (two) times daily.   Past Week at Unknown time  . methocarbamol (ROBAXIN) 500 MG tablet Take 500 mg by mouth 4 (four) times daily.   Past Week at Unknown time  . nitroGLYCERIN (NITROSTAT) 0.4 MG SL tablet Place 0.4 mg under the tongue every 5 (five) minutes as needed for chest pain.   Unknown at PRN  . oxyCODONE (OXY IR/ROXICODONE) 5 MG immediate release tablet Take 5 mg by mouth 2 (two) times a day.   Past Week at Unknown time  . rivaroxaban (XARELTO) 20 MG TABS tablet Take 20 mg by mouth daily with supper.   Past Week at Unknown time  . sennosides-docusate sodium (SENOKOT-S) 8.6-50 MG tablet Take 2 tablets by mouth daily.   Past Week at Unknown time  . spironolactone (ALDACTONE) 25 MG tablet Take 1 tablet (25 mg total) by mouth daily. 90 tablet 3 Past Week at Unknown  time  . Tiotropium Bromide Monohydrate 2.5 MCG/ACT AERS Inhale 2 puffs into the lungs daily.   Past Week at Unknown time  . traZODone (DESYREL) 50 MG tablet Take 50 mg by mouth at bedtime.   Past Week at Unknown time  . docusate sodium (COLACE) 100 MG capsule Take 1 capsule (100 mg total) by mouth 2 (two) times daily. (Patient not taking: Reported on 04/05/2019) 60 capsule 6 Not Taking at Unknown time  . rivaroxaban (XARELTO) 15 MG TABS tablet Take 1 tablet (15 mg total) by mouth daily with supper. (Patient not taking: Reported on 04/05/2019) 30 tablet 1 Not Taking at Unknown time   Scheduled:  . atorvastatin  80 mg Oral Daily  . budesonide (PULMICORT) nebulizer solution  0.25 mg Nebulization BID  . carvedilol  6.25 mg Oral BID WC  . Chlorhexidine Gluconate Cloth  6 each Topical Q0600  . diltiazem  120 mg Oral Daily  . furosemide  40 mg Intravenous Q12H  . guaiFENesin  600 mg Oral BID  . insulin aspart  0-5 Units Subcutaneous QHS  . insulin aspart  0-9 Units Subcutaneous TID WC  . insulin glargine  10 Units Subcutaneous QHS  . magnesium oxide  400 mg Oral BID  . methocarbamol  500 mg Oral QID  . polyethylene glycol  17 g Oral Daily  . senna-docusate  2 tablet Oral Daily  . tiotropium  18 mcg Inhalation Daily   Infusions:  . sodium chloride Stopped (04/10/19 0742)  .  ceFAZolin (ANCEF) IV    . heparin 1,450 Units/hr (04/11/19 0400)  . levETIRAcetam Stopped (04/10/19 1852)   PRN: sodium chloride, bisacodyl, hydrALAZINE, HYDROmorphone (DILAUDID) injection, ipratropium-albuterol, morphine injection, nitroGLYCERIN, ondansetron (ZOFRAN) IV, ondansetron (ZOFRAN) IV, oxyCODONE, simethicone  Assessment: Pharmacy consulted to start heparin and hold rivaroxaban for possible procedure. Will use aPTT to monitor heparin as patient has been on rivaroxaban. Last dose of rivaroxaban 10/9 @1757 . Heparin infusion rate was increased overnight following a supra-therapeutic aPTT. Therefore, heparin was held  this morning until a level could be drawn. H&H, PLT lower than normal limits but stable  Heparin Course: 10/11 am initiation: 1300 units/hr w/o bolus 10/12 0043 aPTT 114s: inc to 1450 units/hr 10/12 0747 aPTT 76s  Goal of Therapy:  aPTT 66-102 seconds and HL 0.3-0.7 once aPTT and heparin level correlate.  Monitor platelets by anticoagulation protocol: Yes   Plan:   Continue heparin infusion at 1450  units/hr  F/U aPTT 6 hours after restarting heparin infusion  HL in am to check for correlation  F/U CBC in am  Lowella Bandy, PharmD 04/11/2019,7:44 AM

## 2019-04-11 NOTE — Progress Notes (Signed)
Subjective:  More lethargic yesterday with hx of CHF.  Was placed on O2, currently on 3L O2 and improved. S/p lasix yesterday  Past Medical History:  Diagnosis Date  . CAD in native artery    a. stress echo 12/2007 abnl, EF > 55%, b. LHC 01/28/08: mLAD 30, D1 40, dLCx 70, pRCA 30, mRCA 70, mRCA lesion 2 80, PDA 90, s/p PCI/BMS to prox and distal RCA, s/p PCI/BMS to PDA; c. patient reports PCI/stenting x 2 in early 2017 at the [place] (no records on file) d. 06/2016: cath showing patent stents along RCA and LCx with moderate 40% stenosis along the LAD.   [name] Carotid arterial disease (HCC)    a. 05/2018 CTA Head/neck: 80-90% stenosis @ L carotid bifurcation, extending into the prox LICA. 60% stenosis @ R carotid bifurcation. 50-75% bilateral distal cavernous carotid dzs.  . Chronic combined systolic (congestive) and diastolic (congestive) heart failure (HCC)    a. 2009 Echo: > 55%; b. 06/2016: EF 25-30%; c. 12/2017 Echo: EF 30-35%, diff HK; d. 05/2018 Echo: 30-35%, mild conc LVH, diff HK. Mild MR. Mildly dil LA/RA.  [name] Chronic kidney disease   . COPD (chronic obstructive pulmonary disease) (HCC)   . Diabetes mellitus without complication (HCC)   . Gout   . Hyperlipidemia   . Hypertension   . Hypertensive heart disease   . Ischemic cardiomyopathy    a. 05/2018: 30-35%.  . Obesity   . Persistent atrial fibrillation (HCC) 01/2017   a. cardioverted 9/18 to NSR; b. 12/2017 noted to be back in Afib-outpt dccv rec; c. CHA2DS2VASc = 6-->Xarelto.  . Stroke (cerebrum) (HCC)    a. 05/2018 MRI: small patchy acute to subacute cortial and white matter infarct in the bilat cerebrum. Mild chronic small vessel ischmia; b. 05/2018 Carotid U/S: L-carotid bifurcation 80-90% stenosed, R- carotid bifurcation 60% stenosed.  . Tobacco abuse     Past Surgical History:  Procedure Laterality Date  . CARDIAC CATHETERIZATION  2009   [facility];   . CARDIAC CATHETERIZATION  2010  . CARDIAC CATHETERIZATION N/A 07/28/2016   Procedure:  Right and Left Heart Cath and possible PCI;  Surgeon: Iran Ouch, MD;  Location: [facility] INVASIVE CV [facility];  Service: Cardiovascular;  Laterality: N/A;  . CARDIOVERSION N/A 03/27/2017   Procedure: CARDIOVERSION;  Surgeon: Iran Ouch, MD;  Location: [facility] ORS;  Service: Cardiovascular;  Laterality: N/A;  . CARDIOVERSION N/A 07/30/2018   Procedure: CARDIOVERSION;  Surgeon: Antonieta Iba, MD;  Location: [facility] ORS;  Service: Cardiovascular;  Laterality: N/A;  . CAROTID ANGIOGRAPHY Bilateral 04/08/2019   Procedure: CAROTID ANGIOGRAPHY;  Surgeon: Renford Dills, MD;  Location: [facility] INVASIVE CV LAB;  Service: Cardiovascular;  Laterality: Bilateral;  . CORONARY ANGIOPLASTY  2009   s/p stent placement at [facility].    Family History  Problem Relation Age of Onset  . Heart attack Mother 48  . Hypertension Mother   . Cancer Brother    Social History:  reports that he has quit smoking. His smoking use included cigarettes. He has a 35.00 pack-year smoking history. He has never used smokeless tobacco. He reports previous alcohol use. He reports current drug use.  Allergies:  Allergies  Allergen Reactions  . Aspirin Other (See Comments)    Told not to take because of blood thinner  . Entresto [Sacubitril-Valsartan] Other (See Comments)    Dizziness, weakness and stuttering  . Ibuprofen Nausea And Vomiting  . Tylenol [Acetaminophen] Nausea And Vomiting    Medications Prior to  Admission  Medication Sig Dispense Refill  . atorvastatin (LIPITOR) 80 MG tablet Take 80 mg by mouth daily.    . budesonide-formoterol (SYMBICORT) 160-4.5 MCG/ACT inhaler Inhale 2 puffs into the lungs 2 (two) times daily.    . carvedilol (COREG) 6.25 MG tablet Take 1 tablet (6.25 mg total) by mouth 2 (two) times daily with a meal. 60 tablet 1  . magnesium oxide (MAG-OX) 400 MG tablet Take 400 mg by mouth 2 (two) times daily.    . methocarbamol (ROBAXIN) 500 MG tablet Take 500 mg by mouth 4 (four) times daily.    .  nitroGLYCERIN (NITROSTAT) 0.4 MG SL tablet Place 0.4 mg under the tongue every 5 (five) minutes as needed for chest pain.    [name] oxyCODONE (OXY IR/ROXICODONE) 5 MG immediate release tablet Take 5 mg by mouth 2 (two) times a day.    . rivaroxaban (XARELTO) 20 MG TABS tablet Take 20 mg by mouth daily with supper.    . sennosides-docusate sodium (SENOKOT-S) 8.6-50 MG tablet Take 2 tablets by mouth daily.    [name] spironolactone (ALDACTONE) 25 MG tablet Take 1 tablet (25 mg total) by mouth daily. 90 tablet 3  . Tiotropium Bromide Monohydrate 2.5 MCG/ACT AERS Inhale 2 puffs into the lungs daily.    . traZODone (DESYREL) 50 MG tablet Take 50 mg by mouth at bedtime.    . docusate sodium (COLACE) 100 MG capsule Take 1 capsule (100 mg total) by mouth 2 (two) times daily. (Patient not taking: Reported on 04/05/2019) 60 capsule 6  . rivaroxaban (XARELTO) 15 MG TABS tablet Take 1 tablet (15 mg total) by mouth daily with supper. (Patient not taking: Reported on 04/05/2019) 30 tablet 1    ROS: As pr HPI  Physical Examination: Blood pressure (!) 146/87, pulse (!) 117, temperature 97.7 F (36.5 C), temperature source Oral, resp. rate (!) 8, height 5\' 11"  (1.803 m), weight 99.8 kg, SpO2 95 %.   Neurologic Examination: Alert, awake, following commands, slight dysrathria PERLA, EOMI, no nystagmus, VFF, face seems symmetrical, face sensation tot touch seems symmetrical, uvukla and tongue midline 4/5 on LUEX/LLEX/RLEX. He is 1/5 in [place] and 1/5 in right hand No sensory deficit appreciated No coordination deficit appreciated DTR not checked at time of examination    Results for orders placed or performed during the hospital encounter of 04/04/19 (from the past 48 hour(s))  Glucose, capillary     Status: Abnormal   Collection Time: 04/09/19 11:33 AM  Result Value Ref Range   Glucose-Capillary 130 (H) 70 - 99 mg/dL  Glucose, capillary     Status: Abnormal   Collection Time: 04/09/19  5:04 PM  Result Value Ref  Range   Glucose-Capillary 120 (H) 70 - 99 mg/dL  Glucose, capillary     Status: Abnormal   Collection Time: 04/09/19  8:35 PM  Result Value Ref Range   Glucose-Capillary 140 (H) 70 - 99 mg/dL  Blood gas, arterial     Status: Abnormal (Preliminary result)   Collection Time: 04/09/19 10:00 PM  Result Value Ref Range   FIO2 0.28    Delivery systems [place] 2 LPM    pH, Arterial 7.36 7.350 - 7.450   pCO2 arterial 35 32.0 - 48.0 mmHg   pO2, Arterial 45 (L) 83.0 - 108.0 mmHg   Bicarbonate 19.8 (L) 20.0 - 28.0 mmol/L   Acid-base deficit 4.9 (H) 0.0 - 2.0 mmol/L   O2 Saturation 78.6 %   Patient temperature 37.0    Collection  site RIGHT RADIAL    Sample type ARTERIAL DRAW    Allens test (pass/fail) PASS PASS    Comment: Performed at [facility], [address] [place], [place] [address]   Mechanical Rate PENDING   CBC with Differential/Platelet     Status: Abnormal   Collection Time: 04/09/19 11:04 PM  Result Value Ref Range   WBC 6.5 4.0 - 10.5 K/uL   RBC 4.20 (L) 4.22 - 5.81 MIL/uL   Hemoglobin 12.2 (L) 13.0 - 17.0 g/dL   HCT 20.9 47.0 - 96.2 %   MCV 95.5 80.0 - 100.0 fL   MCH 29.0 26.0 - 34.0 pg   MCHC 30.4 30.0 - 36.0 g/dL   RDW 83.6 (H) 62.9 - 47.6 %   Platelets 150 150 - 400 K/uL   nRBC 4.1 (H) 0.0 - 0.2 %   Neutrophils Relative % 73 %   Neutro Abs 4.7 1.7 - 7.7 K/uL   Lymphocytes Relative 18 %   Lymphs Abs 1.2 0.7 - 4.0 K/uL   Monocytes Relative 7 %   Monocytes Absolute 0.5 0.1 - 1.0 K/uL   Eosinophils Relative 1 %   Eosinophils Absolute 0.1 0.0 - 0.5 K/uL   Basophils Relative 1 %   Basophils Absolute 0.1 0.0 - 0.1 K/uL   Immature Granulocytes 0 %   Abs Immature Granulocytes 0.02 0.00 - 0.07 K/uL    Comment: Performed at [facility], [address] [place], [place] [address]  Basic metabolic panel     Status: Abnormal   Collection Time: 04/09/19 11:04 PM  Result Value Ref Range   Sodium 138 135 - 145 mmol/L   Potassium 5.2 (H) 3.5 - 5.1 mmol/L    Chloride 108 98 - 111 mmol/L   CO2 18 (L) 22 - 32 mmol/L   Glucose, Bld 134 (H) 70 - 99 mg/dL   BUN 38 (H) 8 - 23 mg/dL   Creatinine, Ser 3.54 (H) 0.61 - 1.24 mg/dL   Calcium 9.1 8.9 - 65.6 mg/dL   GFR calc non Af Amer 35 (L) >60 mL/min   GFR calc Af Amer 40 (L) >60 mL/min   Anion gap 12 5 - 15    Comment: Performed at [facility], [address] [place], [place] [address]  Basic metabolic panel     Status: Abnormal   Collection Time: 04/10/19  4:59 AM  Result Value Ref Range   Sodium 139 135 - 145 mmol/L   Potassium 4.7 3.5 - 5.1 mmol/L   Chloride 109 98 - 111 mmol/L   CO2 20 (L) 22 - 32 mmol/L   Glucose, Bld 121 (H) 70 - 99 mg/dL   BUN 39 (H) 8 - 23 mg/dL   Creatinine, Ser 1.70 (H) 0.61 - 1.24 mg/dL   Calcium 9.0 8.9 - 01.7 mg/dL   GFR calc non Af Amer 34 (L) >60 mL/min   GFR calc Af Amer 40 (L) >60 mL/min   Anion gap 10 5 - 15    Comment: Performed at [facility], [address] [place], [place] [address]  CBC     Status: Abnormal   Collection Time: 04/10/19  4:59 AM  Result Value Ref Range   WBC 5.9 4.0 - 10.5 K/uL   RBC 4.15 (L) 4.22 - 5.81 MIL/uL   Hemoglobin 12.1 (L) 13.0 - 17.0 g/dL   HCT 67.5 (L) 91.6 - 38.4 %   MCV 93.3 80.0 - 100.0 fL   MCH 29.2 26.0 -  34.0 pg   MCHC 31.3 30.0 - 36.0 g/dL   RDW 21.3 (H) 08.6 - 57.8 %   Platelets 129 (L) 150 - 400 K/uL   nRBC 2.7 (H) 0.0 - 0.2 %    Comment: Performed at [facility], [address] [place], [place] [address]  Magnesium     Status: Abnormal   Collection Time: 04/10/19  4:59 AM  Result Value Ref Range   Magnesium 2.9 (H) 1.7 - 2.4 mg/dL    Comment: Performed at [facility], [address] [place], [place] [address]  Brain natriuretic peptide     Status: Abnormal   Collection Time: 04/10/19  4:59 AM  Result Value Ref Range   B Natriuretic Peptide 1,018.0 (H) 0.0 - 100.0 pg/mL    Comment: Performed at [facility], [address] [place], [place] [address]   Troponin I (High Sensitivity)     Status: Abnormal   Collection Time: 04/10/19  4:59 AM  Result Value Ref Range   Troponin I (High Sensitivity) 145 (HH) <18 ng/L    Comment: CRITICAL RESULT CALLED TO, READ BACK BY AND VERIFIED WITH EDNA GARNETT AT 0102 ON 04/10/2019 JJB (NOTE) Elevated high sensitivity troponin I (hsTnI) values and significant  changes across serial measurements may suggest ACS but many other  chronic and acute conditions are known to elevate hsTnI results.  Refer to the "Links" section for chest pain algorithms and additional  guidance. Performed at [facility], [address] [place], [place] [address]   Glucose, capillary     Status: Abnormal   Collection Time: 04/10/19  8:13 AM  Result Value Ref Range   Glucose-Capillary 121 (H) 70 - 99 mg/dL   Comment 1 Notify RN   Glucose, capillary     Status: Abnormal   Collection Time: 04/10/19 11:59 AM  Result Value Ref Range   Glucose-Capillary 173 (H) 70 - 99 mg/dL   Comment 1 Notify RN   Heparin level (unfractionated)     Status: Abnormal   Collection Time: 04/10/19 12:05 PM  Result Value Ref Range   Heparin Unfractionated >3.60 (H) 0.30 - 0.70 IU/mL    Comment: RESULTS CONFIRMED BY MANUAL DILUTION Performed at [facility], [address] [place], [place] [address]   APTT     Status: Abnormal   Collection Time: 04/10/19 12:05 PM  Result Value Ref Range   aPTT 49 (H) 24 - 36 seconds    Comment:        IF BASELINE aPTT IS ELEVATED, SUGGEST PATIENT RISK ASSESSMENT BE USED TO DETERMINE APPROPRIATE ANTICOAGULANT THERAPY. Performed at [facility], [address] [place], [place] [address]   Protime-INR     Status: Abnormal   Collection Time: 04/10/19 12:05 PM  Result Value Ref Range   Prothrombin Time 46.8 (H) 11.4 - 15.2 seconds   INR 5.2 (HH) 0.8 - 1.2    Comment: CRITICAL RESULT CALLED TO, READ BACK BY AND VERIFIED WITH: MALKA SINWANY AT 1429 ON 04/10/19 JJB (NOTE) INR  goal varies based on device and disease states. Performed at [facility], [address] [place], [place] [address]   Troponin I (High Sensitivity)     Status: Abnormal   Collection Time: 04/10/19 12:50 PM  Result Value Ref Range   Troponin I (High Sensitivity) 131 (HH) <18 ng/L    Comment: CRITICAL VALUE NOTED. VALUE IS CONSISTENT WITH PREVIOUSLY REPORTED/CALLED VALUE. QSD (NOTE) Elevated high sensitivity troponin I (hsTnI)  values and significant  changes across serial measurements may suggest ACS but many other  chronic and acute conditions are known to elevate hsTnI results.  Refer to the "Links" section for chest pain algorithms and additional  guidance. Performed at [facility], [address] [place], [place] [address]   Blood gas, arterial     Status: Abnormal   Collection Time: 04/10/19  2:28 PM  Result Value Ref Range   FIO2 0.28    pH, Arterial 7.49 (H) 7.350 - 7.450   pCO2 arterial 25 (L) 32.0 - 48.0 mmHg   pO2, Arterial 107 83.0 - 108.0 mmHg   Bicarbonate 19.1 (L) 20.0 - 28.0 mmol/L   Acid-base deficit 2.6 (H) 0.0 - 2.0 mmol/L   O2 Saturation 98.5 %   Patient temperature 37.0    Collection site RIGHT RADIAL    Sample type ARTERIAL DRAW    Allens test (pass/fail) PASS PASS    Comment: Performed at [facility], [address] [place], [place] [address]  MRSA PCR Screening     Status: None   Collection Time: 04/10/19  3:39 PM   Specimen: Nasal Mucosa; Nasopharyngeal  Result Value Ref Range   MRSA by PCR NEGATIVE NEGATIVE    Comment:        The GeneXpert MRSA Assay (FDA approved for NASAL specimens only), is one component of a comprehensive MRSA colonization surveillance program. It is not intended to diagnose MRSA infection nor to guide or monitor treatment for MRSA infections. Performed at [facility], [address] [place], [place] [address]   Glucose, capillary     Status: Abnormal   Collection Time:  04/10/19  3:39 PM  Result Value Ref Range   Glucose-Capillary 162 (H) 70 - 99 mg/dL  Glucose, capillary     Status: Abnormal   Collection Time: 04/10/19  7:48 PM  Result Value Ref Range   Glucose-Capillary 152 (H) 70 - 99 mg/dL  Glucose, capillary     Status: Abnormal   Collection Time: 04/10/19  9:54 PM  Result Value Ref Range   Glucose-Capillary 113 (H) 70 - 99 mg/dL  APTT     Status: Abnormal   Collection Time: 04/11/19 12:43 AM  Result Value Ref Range   aPTT 114 (H) 24 - 36 seconds    Comment:        IF BASELINE aPTT IS ELEVATED, SUGGEST PATIENT RISK ASSESSMENT BE USED TO DETERMINE APPROPRIATE ANTICOAGULANT THERAPY. Performed at [facility], [address] [place], [place] [address]   Basic metabolic panel     Status: Abnormal   Collection Time: 04/11/19  7:47 AM  Result Value Ref Range   Sodium 141 135 - 145 mmol/L   Potassium 4.2 3.5 - 5.1 mmol/L   Chloride 111 98 - 111 mmol/L   CO2 22 22 - 32 mmol/L   Glucose, Bld 111 (H) 70 - 99 mg/dL   BUN 46 (H) 8 - 23 mg/dL   Creatinine, Ser 2.95 (H) 0.61 - 1.24 mg/dL   Calcium 9.0 8.9 - 62.1 mg/dL   GFR calc non Af Amer 32 (L) >60 mL/min   GFR calc Af Amer 38 (L) >60 mL/min   Anion gap 8 5 - 15    Comment: Performed at [facility], [address] [place], [place] [address]  CBC     Status: Abnormal   Collection Time: 04/11/19  7:47 AM  Result Value Ref Range   WBC 6.3 4.0 -  10.5 K/uL   RBC 4.09 (L) 4.22 - 5.81 MIL/uL   Hemoglobin 12.0 (L) 13.0 - 17.0 g/dL   HCT 16.1 (L) 09.6 - 04.5 %   MCV 94.1 80.0 - 100.0 fL   MCH 29.3 26.0 - 34.0 pg   MCHC 31.2 30.0 - 36.0 g/dL   RDW 40.9 (H) 81.1 - 91.4 %   Platelets 146 (L) 150 - 400 K/uL   nRBC 1.6 (H) 0.0 - 0.2 %    Comment: Performed at [facility], [address], [place], [place] [address]  Magnesium     Status: Abnormal   Collection Time: 04/11/19  7:47 AM  Result Value Ref Range   Magnesium 2.9 (H) 1.7 - 2.4 mg/dL    Comment: Performed  at [facility], [address] [place], [place] [address]  APTT     Status: Abnormal   Collection Time: 04/11/19  7:47 AM  Result Value Ref Range   aPTT 76 (H) 24 - 36 seconds    Comment:        IF BASELINE aPTT IS ELEVATED, SUGGEST PATIENT RISK ASSESSMENT BE USED TO DETERMINE APPROPRIATE ANTICOAGULANT THERAPY. Performed at [facility], [address] [place], [place] [address]   Glucose, capillary     Status: Abnormal   Collection Time: 04/11/19  8:12 AM  Result Value Ref Range   Glucose-Capillary 105 (H) 70 - 99 mg/dL   Dg Chest 1 View  Result Date: 04/09/2019 CLINICAL DATA:  Shortness of breath EXAM: CHEST  1 VIEW COMPARISON:  04/04/2019 FINDINGS: There is persistent cardiomegaly with worsening pulmonary edema and interstitial lung markings. No pneumothorax. There are probable small bilateral pleural effusions, right greater than left. IMPRESSION: Worsening congestive heart failure. Electronically Signed   By: Katherine Mantle M.D.   On: 04/09/2019 22:35   Dg Chest 1 View  Result Date: 04/09/2019 CLINICAL DATA:  Right side weakness. EXAM: CHEST  1 VIEW COMPARISON:  Single-view of the chest 04/04/2019. FINDINGS: There is cardiomegaly and pulmonary edema which is new since yesterday's study. No pneumothorax or pleural fluid. Atherosclerosis noted. IMPRESSION: New pulmonary edema. Cardiomegaly. Atherosclerosis. Electronically Signed   By: Drusilla Kanner M.D.   On: 04/09/2019 13:01   Ct Head Wo Contrast  Result Date: 04/09/2019 CLINICAL DATA:  Encephalopathy EXAM: CT HEAD WITHOUT CONTRAST TECHNIQUE: Contiguous axial images were obtained from the base of the skull through the vertex without intravenous contrast. COMPARISON:  04/08/2019 FINDINGS: Brain: There is no mass, hemorrhage or extra-axial collection. The size and configuration of the ventricles and extra-axial CSF spaces are normal. There is hypoattenuation of the white matter, most commonly indicating  chronic small vessel disease. Multifocal hypoattenuation in the left hemispheric white matter consistent with known areas of infarct. Vascular: No abnormal hyperdensity of the major intracranial arteries or dural venous sinuses. No intracranial atherosclerosis. Skull: The visualized skull base, calvarium and extracranial soft tissues are normal. Sinuses/Orbits: No fluid levels or advanced mucosal thickening of the visualized paranasal sinuses. No mastoid or middle ear effusion. The orbits are normal. IMPRESSION: Chronic ischemic microangiopathy without acute intracranial abnormality. Electronically Signed   By: Deatra Robinson M.D.   On: 04/09/2019 22:44    Assessment: 63 y.o. male with history of CAD, CHF, HTN, HLD, atrial fibrillation on Xarelto, CVA admitted with right sided weakness and slurred speech in whom head ct shows a left frontal hypodensity most likely c/w subacute infarct.  Plan: Mentation slightly improved from yesterday along with oxygenation  Possible CEA  this week CTH done on 10/10 no acute abnormalities  04/11/2019, 10:34 AM

## 2019-04-11 NOTE — Progress Notes (Signed)
PT Cancellation Note  Patient Details Name: [name] MRN: [id number] DOB: 1955-10-29   Cancelled Treatment:    Reason Eval/Treat Not Completed: Medical issues which prohibited therapy(Per chart review, patient noted with transfer to CCU due to decline in medical status.  Per guidelines, to require new order to resume services after change in condition/transfer to higher level of care.  Please reconsult as medically appropriate.)   Sriram Febles H. Owens Shark, PT, DPT, NCS 04/11/19, 8:26 AM [phone]

## 2019-04-11 NOTE — Evaluation (Addendum)
Clinical/Bedside Swallow Evaluation Patient Details  Name: [name] MRN: [id number] Date of Birth: 01-Nov-1955  Today's Date: 04/11/2019 Time: SLP Start Time (ACUTE ONLY): 1145 SLP Stop Time (ACUTE ONLY): 1245 SLP Time Calculation (min) (ACUTE ONLY): 60 min  Past Medical History:  Past Medical History:  Diagnosis Date  . CAD in native artery    a. stress echo 12/2007 abnl, EF > 55%, b. LHC 01/28/08: mLAD 30, D1 40, dLCx 43, pRCA 30, mRCA 70, mRCA lesion 2 80, PDA 90, s/p PCI/BMS to prox and distal RCA, s/p PCI/BMS to PDA; c. patient reports PCI/stenting x 2 in early 2017 at the [place] (no records on file) d. 06/2016: cath showing patent stents along RCA and LCx with moderate 40% stenosis along the LAD.   [name] Carotid arterial disease ([place])    a. 05/2018 CTA Head/neck: 80-90% stenosis @ L carotid bifurcation, extending into the prox LICA. 60% stenosis @ R carotid bifurcation. 50-75% bilateral distal cavernous carotid dzs.  . Chronic combined systolic (congestive) and diastolic (congestive) heart failure ([place])    a. 2009 Echo: > 55%; b. 06/2016: EF 25-30%; c. 12/2017 Echo: EF 30-35%, diff HK; d. 05/2018 Echo: 30-35%, mild conc LVH, diff HK. Mild MR. Mildly dil LA/RA.  [name] Chronic kidney disease   . COPD (chronic obstructive pulmonary disease) ([place])   . Diabetes mellitus without complication ([place])   . Gout   . Hyperlipidemia   . Hypertension   . Hypertensive heart disease   . Ischemic cardiomyopathy    a. 05/2018: 30-35%.  . Obesity   . Persistent atrial fibrillation ([place]) 01/2017   a. cardioverted 9/18 to NSR; b. 12/2017 noted to be back in Afib-outpt dccv rec; c. CHA2DS2VASc = 6-->Xarelto.  . Stroke (cerebrum) ([place])    a. 05/2018 MRI: small patchy acute to subacute cortial and white matter infarct in the bilat cerebrum. Mild chronic small vessel ischmia; b. 05/2018 Carotid U/S: L-carotid bifurcation 80-90% stenosed, R- carotid bifurcation 60% stenosed.  . Tobacco abuse    Past Surgical History:   Past Surgical History:  Procedure Laterality Date  . CARDIAC CATHETERIZATION  2009   [facility];   . CARDIAC CATHETERIZATION  2010  . CARDIAC CATHETERIZATION N/A 07/28/2016   Procedure: Right and Left Heart Cath and possible PCI;  Surgeon: Wellington Hampshire, MD;  Location: [place] [facility];  Service: Cardiovascular;  Laterality: N/A;  . CARDIOVERSION N/A 03/27/2017   Procedure: CARDIOVERSION;  Surgeon: Wellington Hampshire, MD;  Location: [facility];  Service: Cardiovascular;  Laterality: N/A;  . CARDIOVERSION N/A 07/30/2018   Procedure: CARDIOVERSION;  Surgeon: Minna Merritts, MD;  Location: [facility];  Service: Cardiovascular;  Laterality: N/A;  . CAROTID ANGIOGRAPHY Bilateral 04/08/2019   Procedure: CAROTID ANGIOGRAPHY;  Surgeon: Katha Cabal, MD;  Location: [place] [facility];  Service: Cardiovascular;  Laterality: Bilateral;  . CORONARY ANGIOPLASTY  2009   s/p stent placement at [facility].   HPI:  Pt is a 63 yo male who presented to ED 10/5 with some dysarthria and mild R sided weakness, pt treated earlier in the day for SOB and CHF, CT scan shows left frontal infarct. PMH includes CAD, CKD, COPD, DM, HLD, HTN, persistent Afib, Obesity, stroke 05/2018.  Chronic systolic heart failure with EF 30-35%.  Pt was transferred to the ICU for increased work of breathing and mental status changes yesterday.  He has Not been wearing his CPAP at night per report.  Currently off oxygen but remains tachycardic.  Answer some  questions appropriately but mental status remains somewhat poor; drowsy w/ eyes closed at times.    Assessment / Plan / Recommendation Clinical Impression  Pt appeared to present w/ Mild oropharyngeal phase dysphagia w/ the trials assessed at this evalution today. Pt was not given trials of solid foods d/t declined Cognitive status and a mostly Edentulous status at the time. Pt required Mod. verbal/tactile cues for follow through w/ aspiration precautions and was given frequent reminders  to maintain attention and keep eyes open during oral intake --- suspect impact from illness/mental status. Pt does present w/ increased risk for aspiration w/ oral intake at this time.  Pt consumed trials of ice chips and Nectar consistency liquids via Cup then Straw w/ no immediate, overt s/s of aspiration; no decline in vocal quality or respiratory status during/post trials. O2 sats remained in upper 90s, no decline in RR/HR. [facility] and A-P transfer w/ trials of Nectar liquids and purees; min slower bolus clearing but this was fully achieved given time. No trials of solids were assessed at this eval d/t edentulous status and mental status decline. OM exam revealed no gross unilateral weakness/asymmetry; no anterior leakage. Speech intelligible for the most part; often mumbled w/ low volume -- pt has been answering general questions appropriately for NSG per report. Pt required full feeding Supervision and support(verbal/visual cues) d/t weakness and mental status decline(eyes closing at times). Recommend a dysphagia level 1 (Puree) diet w/ Nectar consistency liquids -- monitor for any impulsive drinking behaviors; aspiration precautions; Pills Crushed in Puree for safer swallowing; feeding support/supervision. ST services to f/u w/ pt's toleration of diet, trials to upgrade. Pt will need f/u for ongoing Cognitive-linguistic needs post discharge. MD updated on evaluation findings. Of note, pt had Mild dysarthria and Cognitive-Linguistic deficits this admission as per informal bedside assessment last week. F/u post discharge is recommended.  SLP Visit Diagnosis: Dysphagia, oropharyngeal phase (R13.12);Cognitive communication deficit (R41.841)    Aspiration Risk  Mild aspiration risk;Risk for inadequate nutrition/hydration    Diet Recommendation  Dysphagia level 1 (puree) w/ NECTAR consistency liquids; aspiration precautions; feeding support and Supervision at all meals.    Medication Administration: Crushed with puree(for safer swallowing)    Other  Recommendations Recommended Consults: (Dietician f/u) Oral Care Recommendations: Oral care BID;Staff/trained caregiver to provide oral care Other Recommendations: Order thickener from pharmacy;Prohibited food (jello, ice cream, thin soups);Remove water pitcher;Have oral suction available   Follow up Recommendations Skilled Nursing facility(TBD)      Frequency and Duration min 3x week  2 weeks       Prognosis Prognosis for Safe Diet Advancement: Fair Barriers to Reach Goals: Cognitive deficits;Time post onset;Severity of deficits      Swallow Study   General Date of Onset: 04/04/19 HPI: Pt is a 63 yo male who presented to ED 10/5 with some dysarthria and mild R sided weakness, pt treated earlier in the day for SOB and CHF, CT scan shows left frontal infarct. PMH includes CAD, CKD, COPD, DM, HLD, HTN, persistent Afib, Obesity, stroke 05/2018.  Pt was transferred to the ICU for increased work of breathing and mental status changes yesterday.  He has Not been wearing his CPAP at night per report.  Currently off oxygen but remains tachycardic.  Answer some questions appropriately but mental status remains somewhat poor; drowsy w/ eyes closed at times.  Type of Study: Bedside Swallow Evaluation Previous Swallow Assessment: no swallowing evaluations noted Diet Prior to this Study: NPO Temperature  Spikes Noted: No(wbc 6.3) Respiratory Status: Nasal cannula(2 L) History of Recent Intubation: No Behavior/Cognition: Alert;Cooperative;Pleasant mood;Distractible;Requires cueing(Drowsy at times and closed eyes) Oral Cavity Assessment: Dry;Within Functional Limits Oral Care Completed by SLP: Yes Oral Cavity - Dentition: Edentulous(mostly) Vision: (n/a) Self-Feeding Abilities: Total assist(overall weakness) Patient Positioning: Upright in bed(needed positioning) Baseline Vocal Quality: Low vocal intensity Volitional  Cough: (Fair) Volitional Swallow: Able to elicit    Oral/Motor/Sensory Function Overall Oral Motor/Sensory Function: Mild impairment Facial ROM: Within Functional Limits Facial Symmetry: Within Functional Limits Facial Strength: Within Functional Limits Lingual ROM: Within Functional Limits Lingual Symmetry: Within Functional Limits Lingual Strength: Reduced(rapid movements) Velum: Within Functional Limits Mandible: Within Functional Limits   Ice Chips Ice chips: Within functional limits Presentation: Spoon(fed; 6 trials)   Thin Liquid Thin Liquid: Not tested    Nectar Thick Nectar Thick Liquid: Within functional limits Presentation: Cup;Straw;Spoon(~4 ozs total)   Honey Thick Honey Thick Liquid: Not tested   Puree Puree: Within functional limits Presentation: Spoon(fed; 9 trials) Other Comments: NSG giving meds in puree   Solid     Solid: Not tested Other Comments: d/t weakness/illness and min Cognitive decline; mostly edentulous        Jerilynn Som, MS, CCC-SLP , 04/11/2019,3:32 PM

## 2019-04-11 NOTE — Progress Notes (Addendum)
Name: [name] Carol MRN: [id number] DOB: 08-26-1955     CONSULTATION DATE: 04/11/2019  CHIEF COMPLAINT:  Acute respiratory failure  STUDIES:  10/5 Admitted to the hospital for right sided weakness, with left frontal infarct on CT and acute CHF exacerbation 10/6 Echocardiogram demonstrated chronic systolic heart failure with EF 30-35% 10/8 MRI demonstrated multiple acute infarcts within the left cerebral hemisphere involving the cortex. 10/11 Transferred to ICU overnight because he had increased work of breathing and became somnolent.   HISTORY OF PRESENT ILLNESS:   [name] Hiegel is a 63 yo male with history of CAD, known afib/flutter and cardiomyopathy with a prior stroke who presented to the ED complaining of weakness and was found to have an acute CVA with right upper extremity weakness. MRI demonstrated multiple acute infarcts within the left cerebral hemisphere. CT of neck showed soft calcified plaque near occlusion of left carotid which was confirmed by angiography. Anticipate surgical endarterectomy this week per vascular. Pt was transferred to  ICU overnight 10/11 for increased work of breathing and somnolence. Today, pt follows commands and answers appropriately but remains tachypneic.  PAST MEDICAL HISTORY :   has a past medical history of CAD in native artery, Carotid arterial disease (HCC), Chronic combined systolic (congestive) and diastolic (congestive) heart failure (HCC), Chronic kidney disease, COPD (chronic obstructive pulmonary disease) (HCC), Diabetes mellitus without complication (HCC), Gout, Hyperlipidemia, Hypertension, Hypertensive heart disease, Ischemic cardiomyopathy, Obesity, Persistent atrial fibrillation (HCC) (01/2017), Stroke (cerebrum) (HCC), and Tobacco abuse.  has a past surgical history that includes Cardiac catheterization (2009); Cardiac catheterization (2010); Coronary angioplasty (2009); Cardiac catheterization (N/A, 07/28/2016); CARDIOVERSION (N/A,  03/27/2017); CARDIOVERSION (N/A, 07/30/2018); and CAROTID ANGIOGRAPHY (Bilateral, 04/08/2019). Prior to Admission medications   Medication Sig Start Date End Date Taking? Authorizing Provider  atorvastatin (LIPITOR) 80 MG tablet Take 80 mg by mouth daily.   Yes [provider]  budesonide-formoterol (SYMBICORT) 160-4.5 MCG/ACT inhaler Inhale 2 puffs into the lungs 2 (two) times daily.   Yes [provider]  carvedilol (COREG) 6.25 MG tablet Take 1 tablet (6.25 mg total) by mouth 2 (two) times daily with a meal. 07/31/18  Yes Altamese DillingVachhani, Vaibhavkumar, MD  magnesium oxide (MAG-OX) 400 MG tablet Take 400 mg by mouth 2 (two) times daily.   Yes [provider]  methocarbamol (ROBAXIN) 500 MG tablet Take 500 mg by mouth 4 (four) times daily.   Yes [provider]  nitroGLYCERIN (NITROSTAT) 0.4 MG SL tablet Place 0.4 mg under the tongue every 5 (five) minutes as needed for chest pain.   Yes [provider]  oxyCODONE (OXY IR/ROXICODONE) 5 MG immediate release tablet Take 5 mg by mouth 2 (two) times a day. 01/14/19  Yes [provider]  rivaroxaban (XARELTO) 20 MG TABS tablet Take 20 mg by mouth daily with supper.   Yes [provider]  sennosides-docusate sodium (SENOKOT-S) 8.6-50 MG tablet Take 2 tablets by mouth daily.   Yes [provider]  spironolactone (ALDACTONE) 25 MG tablet Take 1 tablet (25 mg total) by mouth daily. 09/09/18  Yes Dunn, Raymon Muttonyan M, PA-C  Tiotropium Bromide Monohydrate 2.5 MCG/ACT AERS Inhale 2 puffs into the lungs daily.   Yes [provider]  traZODone (DESYREL) 50 MG tablet Take 50 mg by mouth at bedtime.   Yes [provider]  docusate sodium (COLACE) 100 MG capsule Take 1 capsule (100 mg total) by mouth 2 (two) times daily. Patient not taking: Reported on 04/05/2019 09/02/18   Delma FreezeHackney, Tina A, FNP  rivaroxaban (XARELTO) 15 MG TABS tablet Take 1 tablet (15 mg total) by mouth daily with supper. Patient  not taking: Reported on 04/05/2019 08/20/18   Enedina Finner, MD   Allergies  Allergen Reactions  . Aspirin Other (See Comments)    Told not to take because of blood thinner  . Entresto [Sacubitril-Valsartan] Other (See Comments)    Dizziness, weakness and stuttering  . Ibuprofen Nausea And Vomiting  . Tylenol [Acetaminophen] Nausea And Vomiting    Review of Systems: Pt denied all compliants  Gen:  Denies  fever, sweats, chills weight loss  HEENT: Denies blurred vision, double vision, ear pain, eye pain, hearing loss, nose bleeds, sore throat Cardiac:  No dizziness, chest pain or heaviness, chest tightness,edema, No JVD Resp:   No cough, -sputum production, -shortness of breath,-wheezing, -hemoptysis,  Gi: Denies swallowing difficulty, stomach pain, nausea or vomiting, diarrhea, constipation, bowel incontinence Gu:  Denies bladder incontinence, burning urine Ext:   Denies Joint pain, stiffness or swelling Skin: Denies  skin rash, easy bruising or bleeding or hives Endoc:  Denies polyuria, polydipsia , polyphagia or weight change Psych:   Denies depression, insomnia or hallucinations  Other:  All other systems negative   VITAL SIGNS: Temp:  [97.7 F (36.5 C)-98.4 F (36.9 C)] 97.7 F (36.5 C) (10/12 0758) Pulse Rate:  [58-117] 116 (10/12 1100) Resp:  [0-44] 9 (10/12 1100) BP: (119-173)/(82-135) 127/103 (10/12 1100) SpO2:  [88 %-100 %] 99 % (10/12 1100)   I/O last 3 completed shifts: In: 649.4 [P.O.:240; I.V.:123.5; IV Piggyback:285.9] Out: 2400 [Urine:2400] Total I/O In: 144.9 [I.V.:54.2; IV Piggyback:90.8] Out: -    SpO2: 99 % O2 Flow Rate (L/min): 2 L/min   Physical Examination:  GENERAL:critically ill appearing, +resp distress HEAD: Normocephalic, atraumatic.  EYES: Pupils equal, round, reactive to light.  No scleral icterus.  MOUTH: Moist mucosal membrane. NECK: Supple. No JVD.  PULMONARY: tachypneic, but patient would stop breathing intentionally upon  auscultation CARDIOVASCULAR: S1 and S2. Regular rate and rhythm. No murmurs, rubs, or gallops.  GASTROINTESTINAL: Soft, nontender, -distended. No masses. Positive bowel sounds. No hepatosplenomegaly.  MUSCULOSKELETAL: No swelling, clubbing, or edema.  NEUROLOGIC: A&Ox3, responds appropriately SKIN:intact,warm,dry  I personally reviewed lab work that was obtained in last 24 hrs. CXR Independently reviewed  MEDICATIONS: I have reviewed all medications and confirmed regimen as documented   CULTURE RESULTS   Recent Results (from the past 240 hour(s))  Urine culture     Status: None   Collection Time: 04/04/19  7:37 PM   Specimen: Urine, Random  Result Value Ref Range Status   Specimen Description   Final    URINE, RANDOM Performed at [facility], [address] [place], [place] [address]    Special Requests   Final    NONE Performed at [facility], [address], [place], [place] [address]    Culture   Final    NO GROWTH Performed at [facility], [address] [place], [place] [address]    Report Status 04/05/2019 FINAL  Final  SARS CORONAVIRUS 2 (TAT 6-24 HRS) Nasopharyngeal Nasopharyngeal Swab     Status: None   Collection Time: 04/04/19  9:21 PM   Specimen: Nasopharyngeal Swab  Result Value Ref Range Status   SARS Coronavirus 2 NEGATIVE NEGATIVE Final    Comment: (NOTE) SARS-CoV-2 target nucleic acids are NOT DETECTED. The SARS-CoV-2 RNA is generally detectable in upper and lower respiratory specimens during the acute phase of infection. Negative results do  not preclude SARS-CoV-2 infection, do not rule out co-infections with other pathogens, and should not be used as the sole basis for treatment or other patient management decisions. Negative results must be combined with clinical observations, patient history, and epidemiological information. The expected result is Negative. Fact Sheet for  Patients: HairSlick.no Fact Sheet for Healthcare Providers: quierodirigir.com This test is not yet approved or cleared by the [place] FDA and  has been authorized for detection and/or diagnosis of SARS-CoV-2 by FDA under an Emergency Use Authorization (EUA). This EUA will remain  in effect (meaning this test can be used) for the duration of the COVID-19 declaration under Section 56 4(b)(1) of the Act, 21 U.S.C. section 360bbb-3(b)(1), unless the authorization is terminated or revoked sooner. Performed at [facility], [address], [place], [place] [address]   MRSA PCR Screening     Status: None   Collection Time: 04/10/19  3:39 PM   Specimen: Nasal Mucosa; Nasopharyngeal  Result Value Ref Range Status   MRSA by PCR NEGATIVE NEGATIVE Final    Comment:        The GeneXpert MRSA Assay (FDA approved for NASAL specimens only), is one component of a comprehensive MRSA colonization surveillance program. It is not intended to diagnose MRSA infection nor to guide or monitor treatment for MRSA infections. Performed at [facility], [address], [place], [place] [address]           IMAGING    Dg Chest Port 1 View  Result Date: 04/11/2019 CLINICAL DATA:  Pulmonary disease. Heart disease, hypertension, COPD EXAM: PORTABLE CHEST 1 VIEW COMPARISON:  04/09/2019 FINDINGS: Cardiomegaly. Bilateral interstitial and airspace opacities have slightly improved since prior study, likely improving edema. No effusions or acute bony abnormality. IMPRESSION: Cardiomegaly, improving CHF. Electronically Signed   By: Charlett Nose M.D.   On: 04/11/2019 11:34     ASSESSMENT AND PLAN SYNOPSIS 63 yo male admitted to the ICU with acute hypoxic respiratory failure and lethargy secondary to acute CHF exacerbation and CVA with multiple acute infarcts of left hemisphere and severe stenosis (99%) of left internal carotid  artery.  ACUTE Hypoxic and Hypercapnic Respiratory secondary to acute on chronic CHF exacerbation BIPAP as needed Oxygen as needed High risk for intubation   Acute CVA Severe stenosis (99%) of left internal carotid artery - vascular surgery consulted - plan is to schedule endarterectomy for sometime this week   ACUTE DECOMPENSATED SYSTOLIC CARDIAC FAILURE- EF 30-35% -oxygen as needed -Lasix as tolerated -follow up cardiac enzymes as indicated -follow up cardiology recs  ACUTE KIDNEY INJURY/Renal Failure -follow chem 7 -follow UO -continue Foley Catheter-assess need -Avoid nephrotoxic agents  NEUROLOGY - acute CVA with multiple infarcts of left hemisphere and severe stenosis of left internal carotid artery (99%) - follow with neurology recs   GI GI PROPHYLAXIS as indicated  ENDO - will use ICU hypoglycemic\Hyperglycemia protocol if needed  ELECTROLYTES -follow labs as needed -replace as needed -pharmacy consultation and following  DVT/GI PRX ordered TRANSFUSIONS AS NEEDED MONITOR FSBS ASSESS the need for LABS      Marinus Maw, PA-S   Vida Rigger, M.D.  Pulmonary & Critical Care Medicine  [facility]   Critical care provider statement:    Critical care time (minutes):  32   Critical care time was exclusive of:  Separately billable procedures and  treating other patients   Critical care was necessary to treat or prevent imminent or  life-threatening  deterioration of the following conditions:   Acute hypoxemic respiratory failure, acute decompensated systolic CHF, acute CVA, multiple chronic comorbid conditions   Critical care was time spent personally by me on the following  activities:  Development of treatment plan with patient or surrogate,  discussions with consultants, evaluation of patient's response to  treatment, examination of patient, obtaining history from patient or  surrogate, ordering and performing treatments  and interventions, ordering  and review of laboratory studies and re-evaluation of patient's condition   I assumed direction of critical care for this patient from another  provider in my specialty: no

## 2019-04-11 NOTE — Progress Notes (Signed)
OT Cancellation Note  Patient Details Name: [name] MRN: [id number] DOB: [birth year]   Cancelled Treatment:    Reason Eval/Treat Not Completed: Medical issues which prohibited therapy. Chart reviewed. Pt transferred to ICU on 10/10. Will complete OT order at this time. Please re-consult should pt become more medically appropriate.   Jeni Salles, MPH, MS, OTR/L ascom [phone] 04/11/19, 7:43 AM

## 2019-04-12 ENCOUNTER — Encounter: Payer: Self-pay | Admitting: Primary Care

## 2019-04-12 ENCOUNTER — Other Ambulatory Visit: Payer: No Typology Code available for payment source

## 2019-04-12 DIAGNOSIS — Z7189 Other specified counseling: Secondary | ICD-10-CM

## 2019-04-12 DIAGNOSIS — Z515 Encounter for palliative care: Secondary | ICD-10-CM

## 2019-04-12 DIAGNOSIS — I4891 Unspecified atrial fibrillation: Secondary | ICD-10-CM

## 2019-04-12 LAB — CBC
HCT: 37 % — ABNORMAL LOW (ref 39.0–52.0)
Hemoglobin: 11.4 g/dL — ABNORMAL LOW (ref 13.0–17.0)
MCH: 29.2 pg (ref 26.0–34.0)
MCHC: 30.8 g/dL (ref 30.0–36.0)
MCV: 94.6 fL (ref 80.0–100.0)
Platelets: 130 10*3/uL — ABNORMAL LOW (ref 150–400)
RBC: 3.91 MIL/uL — ABNORMAL LOW (ref 4.22–5.81)
RDW: 22.4 % — ABNORMAL HIGH (ref 11.5–15.5)
WBC: 6.4 10*3/uL (ref 4.0–10.5)
nRBC: 0.9 % — ABNORMAL HIGH (ref 0.0–0.2)

## 2019-04-12 LAB — BASIC METABOLIC PANEL
Anion gap: 9 (ref 5–15)
BUN: 45 mg/dL — ABNORMAL HIGH (ref 8–23)
CO2: 24 mmol/L (ref 22–32)
Calcium: 8.7 mg/dL — ABNORMAL LOW (ref 8.9–10.3)
Chloride: 108 mmol/L (ref 98–111)
Creatinine, Ser: 1.97 mg/dL — ABNORMAL HIGH (ref 0.61–1.24)
GFR calc Af Amer: 41 mL/min — ABNORMAL LOW (ref >60)
GFR calc non Af Amer: 35 mL/min — ABNORMAL LOW (ref >60)
Glucose, Bld: 120 mg/dL — ABNORMAL HIGH (ref 70–99)
Potassium: 3.9 mmol/L (ref 3.5–5.1)
Sodium: 141 mmol/L (ref 135–145)

## 2019-04-12 LAB — APTT
aPTT: >160 seconds (ref 24–36)
aPTT: >160 seconds (ref 24–36)

## 2019-04-12 LAB — GLUCOSE, CAPILLARY
Glucose-Capillary: 112 mg/dL — ABNORMAL HIGH (ref 70–99)
Glucose-Capillary: 188 mg/dL — ABNORMAL HIGH (ref 70–99)
Glucose-Capillary: 173 mg/dL — ABNORMAL HIGH (ref 70–99)
Glucose-Capillary: 194 mg/dL — ABNORMAL HIGH (ref 70–99)

## 2019-04-12 LAB — HEPARIN LEVEL (UNFRACTIONATED): Heparin Unfractionated: 3.24 IU/mL — ABNORMAL HIGH (ref 0.30–0.70)

## 2019-04-12 MED ORDER — METOPROLOL TARTRATE 25 MG PO TABS
25.0000 mg | ORAL_TABLET | Freq: Two times a day (BID) | ORAL | Status: DC
  Administered 2019-04-12 – 2019-04-14 (×5): 25 mg via ORAL
  Filled 2019-04-12 (×5): qty 1

## 2019-04-12 MED ORDER — TORSEMIDE 20 MG PO TABS
40.0000 mg | ORAL_TABLET | Freq: Every day | ORAL | Status: DC
  Administered 2019-04-13: 40 mg via ORAL
  Filled 2019-04-12: qty 2

## 2019-04-12 MED ORDER — FUROSEMIDE 10 MG/ML IJ SOLN: 40.0000 mg | Freq: Every day | INTRAMUSCULAR | Status: DC

## 2019-04-12 MED ORDER — HEPARIN (PORCINE) 25000 UT/250ML-% IV SOLN
850.0000 [IU]/h | INTRAVENOUS | Status: DC
  Administered 2019-04-12 (×2): 1150 [IU]/h via INTRAVENOUS
  Filled 2019-04-12: qty 250

## 2019-04-12 MED ORDER — TORSEMIDE 20 MG PO TABS
20.0000 mg | ORAL_TABLET | Freq: Once | ORAL | Status: AC
  Administered 2019-04-12: 20 mg via ORAL
  Filled 2019-04-12: qty 1

## 2019-04-12 NOTE — Plan of Care (Signed)
Pt with right-side flaccid.  No change in NIH scale overnight.  Pt oriented x 3.  Pt refused to wear Oxygen or Bipap last night.

## 2019-04-12 NOTE — Progress Notes (Signed)
Speech Language Pathology Treatment: Dysphagia  Patient Details Name: [name] MRN: [id number] DOB: 1955-07-26 Today's Date: 04/12/2019 Time: 8185-6314 SLP Time Calculation (min) (ACUTE ONLY): 40 min  Assessment / Plan / Recommendation Clinical Impression  Pt seen for ongoing assessment of toleration of diet; trials to upgrade diet if appropriate, safe for pt. Noted immediately upon entering room, pt was having frequent Apnea moments lasting ~30 seconds. Wife at bedside stating she noted same, then stated "they have been getting worse these days". Pt does not wear his CPAP at night as instructed.  Pt has done well tolerating the recommended modified diet (puree w/ Nectar liquids) so far; Wife/NSG stated pt ate "well" at his meals today. Due to pt's alertness and engagement w/ SLP, trials of thin liquids were attempted w/ monitoring for Apnea moments. Pt appeared to present w/ adequate oropharyngeal phase swallowing function w/ thethin liquid trials via Cupgiven, but noted pt was having 1-2 Apnea moments during the trials --- strict monitoring was given. Pt fed self consuming trials of thin liquids via Cup w/ strict monitoring by SLP w/ no overt s/s of aspiration noted; no decline in O2 sats or vocal quality noted during/post trials. Attempted trials of semi-soft foods(mech soft) but pt was not fully attentive as attention/effort continued during the mastication --- noted increased Munching on the bolus trial w/ prolonged oral phase. No further trials of solid foods were attempted. Pt required Mod. verbal/tactile cues for follow through w/ aspiration precautions during the po trials and was given frequent reminders to slow down when drinking thin liquids via Cup --- suspect impact from Cognitive decline, illness. Pt does present w/ increased risk for aspiration w/ oral intake at this time d/t Apnea moments/drowsiness; Supervision during all oral intake and holding po's if too drowsy is  recommended. Speech intelligible but noted mumbled speech. Pt required full feeding Supervision and support.   Recommend continue a dysphagia level 1 (Puree) diet w/ Nectar consistency liquids via Cup/Straw at this time as pt is scheduled to have surgery tomorrow per Wife --- will reassess pt's status post surgery; aspiration precautions; Pills in Puree for safer swallowing. NSG updated on evaluation findings.     HPI HPI: Pt is a 63 yo male who presented to ED 10/5 with some dysarthria and mild R sided weakness, pt treated earlier in the day for SOB and CHF, CT scan shows left frontal infarct. PMH includes CAD, CKD, COPD, DM, HLD, HTN, persistent Afib, Obesity, stroke 05/2018.  Pt was transferred to the ICU for increased work of breathing and mental status changes yesterday.  He has Not been wearing his CPAP at night per report.  Currently off oxygen but remains tachycardic.  Answer some questions appropriately but mental status remains somewhat poor; drowsy w/ eyes closed at times.       SLP Plan  Continue with current plan of care       Recommendations  Diet recommendations: Dysphagia 1 (puree);Nectar-thick liquid Liquids provided via: Cup;Straw(monitoring) Medication Administration: Whole meds with puree(but crushed in puree if needed for safer swallowing) Supervision: Patient able to self feed;Staff to assist with self feeding;Intermittent supervision to cue for compensatory strategies Compensations: Minimize environmental distractions;Slow rate;Small sips/bites;Lingual sweep for clearance of pocketing;Multiple dry swallows after each bite/sip;Follow solids with liquid Postural Changes and/or Swallow Maneuvers: Seated upright 90 degrees;Upright 30-60 min after meal                General recommendations: (Dietician f/u) Oral Care Recommendations: Oral care BID;Staff/trained caregiver  to provide oral care Follow up Recommendations: Skilled Nursing facility(TBD) SLP Visit Diagnosis:  Dysphagia, oropharyngeal phase (R13.12);Cognitive communication deficit (Z61.096) Plan: Continue with current plan of care       GO                 Jerilynn Som, MS, CCC-SLP Khali Albanese 04/12/2019, 4:43 PM

## 2019-04-12 NOTE — Progress Notes (Signed)
[employer] [facility] [place] [facility]     PATIENT NAME: [name]    MR#:  [id number]  DATE OF BIRTH:  08-Jun-1956  SUBJECTIVE:  Patient had to be transferred to ICU on 04/10/2019 due to worsening shortness of breath from CHF exacerbation and patient having Cheyne-Stokes respirations.  Diuresing well with IV Lasix.  Updated wife present at bedside on treatment plans this morning.  REVIEW OF SYSTEMS:    Review of Systems  Constitutional: Negative for chills and fever.  HENT: Negative for congestion and tinnitus.   Eyes: Negative for blurred vision and double vision.  Respiratory: Negative for cough, shortness of breath and wheezing.   Cardiovascular: Negative for chest pain, orthopnea and PND.  Gastrointestinal: Negative for abdominal pain, diarrhea, nausea and vomiting.  Genitourinary: Negative for dysuria and hematuria.  Neurological: Positive for weakness (Right Upper Ext.  ). Negative for dizziness, sensory change and focal weakness.  All other systems reviewed and are negative.    DRUG ALLERGIES:   Allergies  Allergen Reactions  . Aspirin Other (See Comments)    Told not to take because of blood thinner  . Entresto [Sacubitril-Valsartan] Other (See Comments)    Dizziness, weakness and stuttering  . Ibuprofen Nausea And Vomiting  . Tylenol [Acetaminophen] Nausea And Vomiting    VITALS:  Blood pressure 104/70, pulse (!) 114, temperature (!) 97.5 F (36.4 C), temperature source Oral, resp. rate (!) 35, height 5\' 11"  (1.803 m), weight 99.8 kg, SpO2 (!) 87 %.  PHYSICAL EXAMINATION:   Physical Exam  GENERAL:  63 y.o.-year-old patient lying in bed in no acute distress.  EYES: Pupils equal, round, reactive to light and accommodation. No scleral icterus. Extraocular muscles intact.  HEENT: Head atraumatic, normocephalic. Oropharynx and nasopharynx clear.  NECK:  Supple, no jugular venous distention. No thyroid enlargement, no tenderness.  LUNGS: Normal  breath sounds bilaterally, no wheezing, rales, rhonchi. No use of accessory muscles of respiration.  CARDIOVASCULAR: S1, S2 normal. No murmurs, rubs, or gallops.  ABDOMEN: Soft, nontender, nondistended. Bowel sounds present. No organomegaly or mass.  EXTREMITIES: No cyanosis, clubbing or edema b/l.    NEUROLOGIC: Cranial nerves II through XII are intact. Right upper Ext weakness with strength of 1/5.  Global weakness with 3-4/5 Strength apart from right upper extremity PSYCHIATRIC: The patient is alert and oriented x 3.  SKIN: No obvious rash, lesion, or ulcer.    LABORATORY PANEL:   CBC Recent Labs  Lab 04/12/19 0309  WBC 6.4  HGB 11.4*  HCT 37.0*  PLT 130*   ------------------------------------------------------------------------------------------------------------------  Chemistries  Recent Labs  Lab 04/11/19 0747 04/12/19 0309  NA 141 141  K 4.2 3.9  CL 111 108  CO2 22 24  GLUCOSE 111* 120*  BUN 46* 45*  CREATININE 2.12* 1.97*  CALCIUM 9.0 8.7*  MG 2.9*  --    ------------------------------------------------------------------------------------------------------------------  Cardiac Enzymes No results for input(s): TROPONINI in the last 168 hours. ------------------------------------------------------------------------------------------------------------------  RADIOLOGY:  Dg Chest Port 1 View  Result Date: 04/11/2019 CLINICAL DATA:  Pulmonary disease. Heart disease, hypertension, COPD EXAM: PORTABLE CHEST 1 VIEW COMPARISON:  04/09/2019 FINDINGS: Cardiomegaly. Bilateral interstitial and airspace opacities have slightly improved since prior study, likely improving edema. No effusions or acute bony abnormality. IMPRESSION: Cardiomegaly, improving CHF. Electronically Signed   By: Charlett NoseKevin  Dover M.D.   On: 04/11/2019 11:34     ASSESSMENT AND PLAN:   63 year old male with past medical history of persistent atrial fibrillation, ischemic cardiomyopathy, hypertension,  hyperlipidemia,  history of gout, diabetes, COPD, chronic combined systolic diastolic CHF, carotid artery disease who presented to the hospital due to right upper extremity weakness and noted to have an acute CVA.  1.  Acute/subacute CVA-this was noted on the CT scan of the head on admission. -Patient was refusing MRI yesterday but agreed to it subsequently which showed Multiple acute infarcts within the left cerebral hemisphere involving the cortex as well as subcortical and deep periventricular white matter.  Patient with history of persistent atrial fibrillation - Patient being followed by neurologist.  I discussed case with him.  Appreciate input.  Recommendation is to continue Xarelto.  Neurologist will make a decision if adding low-dose aspirin is indicated or not after vascular intervention Patient has previous history of carotid artery stenosis and is refused treatment in the past. - CTA of the neck recently showing Severe soft and calcified plaque within the left carotid bifurcation and proximal ICA. Near complete occlusion versus complete occlusion with immediate reconstitution within the proximal left ICA. - cont. PT and OT as tolerated.  Patient seen by speech therapist and placed on pured diet with nectar thickened liquids.  2.  Carotid artery stenosis-patient has previous history of left-sided carotid stenosis and has been noncompliant with intervention as per vascular surgery. -Vascular surgery following patient.  Patient had a cerebral angio on 10/09 with string sign ( greater than 90 % of left ICA.  Plan is for revascularization possibly with carotid endarterectomy this week.  To continue anticoagulation for now.  Patient was previously on Xarelto.  I discussed with vascular surgeon and patient was transitioned from Xarelto to heparin drip.  Continue statins. Patient seen by cardiologist for preop evaluation.  Stress test had to be canceled due to patient having Cheyne-Stokes  respirations.  Patient deemed to be moderate risk for noncardiac procedure regardless.  No further testing or intervention will reduce his risk. Plans for possible surgery by vascular surgery team tomorrow  3. AMS/Encephalopathy ; improved  Patient had an episode of confusion on 04/08/2019.  Was reevaluated by neurology.  Neurology thinks this could possibly be seizures.  Repeat CT head done reviewed expected CT appearance of the scattered small left hemisphere infarcts seen by MRI previously.  Patient started on Keppra.  Neurology following.  4. Essential Hypertension - cont. Coreg, Aldactone  5.  Acute on chronic systolic CHF exacerbation.  Patient had to be transferred to ICU on 04/10/2019 due to worsening shortness of breath.  Diuresing well with IV Lasix.  Demadex added.  BiPAP as needed.  Continue Coreg.  Mildly elevated troponin possibly due to demand ischemia.  Stress test canceled this morning due to mental status change. Cardiology following patient.Patient had recent 2D echocardiogram done on 04/05/2019 which revealed ejection fraction of 30 to 35%.  Also revealed moderately increased left ventricular hypertrophy.  6. Hyperlipidemia - cont. Atorvastatin  7. Hx of COPD - no acute exacerbation.  - cont. Symbicort, duonebs as needed, Spiriva.   8.  Diabetes type 2 with hyperglycemia- patient's A1c was as high as 8. - Continue sliding scale insulin, low dose Lantus.  BS stable.    - await DM Coordinator input.    DVT prophylaxis; patient now on heparin drip  All the records are reviewed and case discussed with Care Management/Social Worker. Management plans discussed with the patient, family and they are in agreement. I called and updated patient's wife at bedside on treatment plan at this morning.  CODE STATUS: Full code  TOTAL TIME TAKING  CARE OF THIS PATIENT: 35 minutes.   POSSIBLE D/C IN 4 DAYS, DEPENDING ON CLINICAL CONDITION.   Chardae Mulkern M.D on 04/12/2019 at 3:54 PM   Between 7am to 6pm - Pager - [phone]  After 6pm go to [email or web address] - [facility]  Office  [phone]  CC: Primary care physician; Center, [place]

## 2019-04-12 NOTE — Progress Notes (Signed)
Progress Note  Patient Name: [name] Date of Encounter: 04/12/2019  Primary Cardiologist: [place]  Subjective   No chest pain, SOB, or palpitations. Wants to go home. Heart rates in the 110s off diltiazem.   Inpatient Medications    Scheduled Meds: . atorvastatin  80 mg Oral Daily  . carvedilol  6.25 mg Oral BID WC  . Chlorhexidine Gluconate Cloth  6 each Topical Q0600  . guaiFENesin  600 mg Oral BID  . insulin aspart  0-5 Units Subcutaneous QHS  . insulin aspart  0-9 Units Subcutaneous TID WC  . insulin glargine  10 Units Subcutaneous QHS  . magnesium oxide  400 mg Oral BID  . methocarbamol  500 mg Oral QID  . polyethylene glycol  17 g Oral Daily  . senna-docusate  2 tablet Oral Daily  . tamsulosin  0.4 mg Oral Daily  . torsemide  20 mg Oral Daily   Continuous Infusions: . sodium chloride Stopped (04/10/19 0742)  . heparin 1,150 Units/hr (04/12/19 0631)  . levETIRAcetam 250 mg (04/12/19 4315)   PRN Meds: sodium chloride, bisacodyl, hydrALAZINE, nitroGLYCERIN, ondansetron (ZOFRAN) IV, ondansetron (ZOFRAN) IV, simethicone   Vital Signs    Vitals:   04/12/19 0200 04/12/19 0300 04/12/19 0400 04/12/19 0500  BP: (!) 135/96 (!) 132/104 111/72 104/70  Pulse: (!) 118 (!) 117 (!) 116 (!) 114  Resp: (!) 8 (!) 8 (!) 8 (!) 35  Temp:      TempSrc:      SpO2: 94% 98% 93% (!) 87%  Weight:      Height:        Intake/Output Summary (Last 24 hours) at 04/12/2019 0752 Last data filed at 04/12/2019 0200 Gross per 24 hour  Intake 504.87 ml  Output 800 ml  Net -295.13 ml   Filed Weights   04/05/19 0618  Weight: 99.8 kg    Telemetry    Atrial flutter, 110s, occasional PVCs - Personally Reviewed  ECG    No new tracings - Personally Reviewed  Physical Exam   GEN: No acute distress.   Neck: No JVD. Cardiac: Tachycardic, no murmurs, rubs, or gallops.  Respiratory: Clear to auscultation bilaterally.  GI: Soft, nontender, non-distended.   MS: No  edema; No deformity. Neuro:  Alert and oriented x 3; Nonfocal.  Psych: Normal affect.  Labs    Chemistry Recent Labs  Lab 04/10/19 0459 04/11/19 0747 04/12/19 0309  NA 139 141 141  K 4.7 4.2 3.9  CL 109 111 108  CO2 20* 22 24  GLUCOSE 121* 111* 120*  BUN 39* 46* 45*  CREATININE 2.02* 2.12* 1.97*  CALCIUM 9.0 9.0 8.7*  GFRNONAA 34* 32* 35*  GFRAA 40* 38* 41*  ANIONGAP 10 8 9      Hematology Recent Labs  Lab 04/10/19 0459 04/11/19 0747 04/12/19 0309  WBC 5.9 6.3 6.4  RBC 4.15* 4.09* 3.91*  HGB 12.1* 12.0* 11.4*  HCT 38.7* 38.5* 37.0*  MCV 93.3 94.1 94.6  MCH 29.2 29.3 29.2  MCHC 31.3 31.2 30.8  RDW 20.9* 22.2* 22.4*  PLT 129* 146* 130*    Cardiac EnzymesNo results for input(s): TROPONINI in the last 168 hours. No results for input(s): TROPIPOC in the last 168 hours.   BNP Recent Labs  Lab 04/10/19 0459  BNP 1,018.0*     DDimer No results for input(s): DDIMER in the last 168 hours.   Radiology    Dg Chest Port 1 View  Result Date: 04/11/2019 IMPRESSION: Cardiomegaly,  improving CHF. Electronically Signed   By: Charlett Nose M.D.   On: 04/11/2019 11:34    Cardiac Studies   2D echo 04/05/2019: 1. Left ventricular ejection fraction, by visual estimation, is 30 to 35%. The left ventricle has moderately decreased function. Left ventricular septal wall thickness was moderately increased. Moderately increased left ventricular posterior wall thickness. There is moderately increased left ventricular hypertrophy. 2. Global right ventricle has normal systolic function.The right ventricular size is mildly enlarged. No increase in right ventricular wall thickness. 3. Left atrial size was mild-moderately dilated. 4. Right atrial size was mildly dilated. 5. The mitral valve is degenerative. Mild mitral valve regurgitation. 6. The tricuspid valve is not well visualized. Tricuspid valve regurgitation is mild. 7. The aortic valve was not well visualized Aortic valve  regurgitation is trivial by color flow Doppler. Mild aortic valve stenosis. 8. The pulmonic valve was not well visualized. Pulmonic valve regurgitation is trivial by color flow Doppler. 9. The aortic root was not well visualized. 10. The interatrial septum was not assessed.  Patient Profile     63 y.o. male with history of CAD status post prior stenting to the RCA andrPDA,HFrEF with an EF of 30 to 35% by echo in 05/2018 due to ICM, persistent A. fib/flutter on Xarelto,carotid artery disease status post recent stroke in12/2019 pending right sided CEA, CKD stage II,COPD secondary to tobacco abuse, diabetes, hypertension, hyperlipidemia, and obesityadmitted with acute CVA with noted near occlusion of the left ICA as noted on CTA neck.   Assessment & Plan    1. Perioperative cardiac risk evaluation: -He denies any symptoms concerning for angina -After discussion with MD, we will cancel stress test given [facility] Stokes respirations -He will be moderate risk for noncardiac procedure regardless, no further testing or intervention will reduce this risk  2. CAD involving the native coronary arteries: -No symptoms concerning for angina -Has been on Xarelto in place of ASA -Lipitor  3. Persistent Afib/flutter: -Ventricular rates are mildly tachycardic this morning following discontinuation of Cardizem given his cardiomyopathy -Heparin gtt in place of Xarelto while admitted, will resume DOAC prior to admission -Change Coreg to Lopressor for added rate control, titrate is needed, with need for permissive HTN as well given recent CVA  4. CVA: -Permissive hypertension -Neurology on board  -Pending possible left-sided CEA this admission -Lipitor  5. Carotid artery disease: -Ambiguous CTA neck/carotid artery ultrasound -Vascular surgery is considering CEA   6. HFrEF secondary to ICM: -Stopped diltiazem on 10/12 given his CM -He does not appear grossly volume up -Slight bump in  BUN/SCr this morning -Continue to hold IV Lasix  7. Acute on CKD stage II: -Improving with holding of Lasix  8. [place] Stokes respirations: -Transferred to the ICU on 10/11 -High risk for intubation -Per PCCM  For questions or updates, please contact [facility] Please consult [email or web address] for contact info under Cardiology/STEMI.    Signed, Eula Listen, PA-C [facility] Pager: [phone] 04/12/2019, 7:52 AM

## 2019-04-12 NOTE — Progress Notes (Addendum)
Name: [name] MRN: [id number] DOB: 21-May-1956     CONSULTATION DATE: 04/12/2019  CHIEF COMPLAINT:  Acute respiratory failure  SIGNIFICANT EVENTS/STUDIES:  10/5 Admitted to the hospital for right sided weakness, with left frontal infarct on CT and acute CHF exacerbation 10/6 Echocardiogram demonstrated chronic systolic heart failure with EF 30-35% 10/8 MRI demonstrated multiple acute infarcts within the left cerebral hemisphere involving the cortex. 10/11 Transferred to ICU overnight because he had increased work of breathing and became somnolent 10/13 - mentation improved, plan for OR in am   HISTORY OF PRESENT ILLNESS:   [name] is a 63 yo male with history of CAD, known afib/flutter and cardiomyopathy with a prior stroke who presented to the ED complaining of weakness and was found to have an acute CVA with right upper extremity weakness. MRI demonstrated multiple acute infarcts within the left cerebral hemisphere. CT of neck showed soft calcified plaque near occlusion of left carotid which was confirmed by angiography. Anticipate surgical endarterectomy this week per vascular. Pt was transferred to  ICU overnight 10/11 for increased work of breathing and somnolence. Today, pt is alert and oriented but noncompliant with oxygen nasal cannula and may be appropriate to move to a less critical floor.  PAST MEDICAL HISTORY :   has a past medical history of CAD in native artery, Carotid arterial disease (HCC), Chronic combined systolic (congestive) and diastolic (congestive) heart failure (HCC), Chronic kidney disease, COPD (chronic obstructive pulmonary disease) (HCC), Diabetes mellitus without complication (HCC), Gout, Hyperlipidemia, Hypertension, Hypertensive heart disease, Ischemic cardiomyopathy, Obesity, Persistent atrial fibrillation (HCC) (01/2017), Stroke (cerebrum) (HCC), and Tobacco abuse.  has a past surgical history that includes Cardiac catheterization (2009); Cardiac  catheterization (2010); Coronary angioplasty (2009); Cardiac catheterization (N/A, 07/28/2016); CARDIOVERSION (N/A, 03/27/2017); CARDIOVERSION (N/A, 07/30/2018); and CAROTID ANGIOGRAPHY (Bilateral, 04/08/2019). Prior to Admission medications   Medication Sig Start Date End Date Taking? Authorizing Provider  atorvastatin (LIPITOR) 80 MG tablet Take 80 mg by mouth daily.   Yes [provider]  budesonide-formoterol (SYMBICORT) 160-4.5 MCG/ACT inhaler Inhale 2 puffs into the lungs 2 (two) times daily.   Yes [provider]  carvedilol (COREG) 6.25 MG tablet Take 1 tablet (6.25 mg total) by mouth 2 (two) times daily with a meal. 07/31/18  Yes Altamese Dilling, MD  magnesium oxide (MAG-OX) 400 MG tablet Take 400 mg by mouth 2 (two) times daily.   Yes [provider]  methocarbamol (ROBAXIN) 500 MG tablet Take 500 mg by mouth 4 (four) times daily.   Yes [provider]  nitroGLYCERIN (NITROSTAT) 0.4 MG SL tablet Place 0.4 mg under the tongue every 5 (five) minutes as needed for chest pain.   Yes [provider]  oxyCODONE (OXY IR/ROXICODONE) 5 MG immediate release tablet Take 5 mg by mouth 2 (two) times a day. 01/14/19  Yes [provider]  rivaroxaban (XARELTO) 20 MG TABS tablet Take 20 mg by mouth daily with supper.   Yes [provider]  sennosides-docusate sodium (SENOKOT-S) 8.6-50 MG tablet Take 2 tablets by mouth daily.   Yes [provider]  spironolactone (ALDACTONE) 25 MG tablet Take 1 tablet (25 mg total) by mouth daily. 09/09/18  Yes Dunn, Raymon Mutton, PA-C  Tiotropium Bromide Monohydrate 2.5 MCG/ACT AERS Inhale 2 puffs into the lungs daily.   Yes [provider]  traZODone (DESYREL) 50 MG tablet Take 50 mg by mouth at bedtime.   Yes [provider]  docusate sodium (COLACE) 100 MG capsule Take 1 capsule (  100 mg total) by mouth 2 (two) times daily. Patient not taking: Reported on 04/05/2019 09/02/18   Delma FreezeHackney, Tina  A, FNP  rivaroxaban (XARELTO) 15 MG TABS tablet Take 1 tablet (15 mg total) by mouth daily with supper. Patient not taking: Reported on 04/05/2019 08/20/18   Enedina FinnerPatel, Sona, MD   Allergies  Allergen Reactions  . Aspirin Other (See Comments)    Told not to take because of blood thinner  . Entresto [Sacubitril-Valsartan] Other (See Comments)    Dizziness, weakness and stuttering  . Ibuprofen Nausea And Vomiting  . Tylenol [Acetaminophen] Nausea And Vomiting    Review of Systems:  Gen:  Denies  fever, sweats, chills weight loss  HEENT: Denies blurred vision, double vision, ear pain, eye pain, hearing loss, nose bleeds, sore throat Cardiac:  No dizziness, chest pain or heaviness, chest tightness,edema, No JVD Resp:   No cough, -sputum production, -shortness of breath,-wheezing, -hemoptysis,  Gi: Denies swallowing difficulty, stomach pain, nausea or vomiting, diarrhea, constipation, bowel incontinence Gu:  Denies bladder incontinence, burning urine Ext:   Denies Joint pain, stiffness or swelling Skin: Denies  skin rash, easy bruising or bleeding or hives Endoc:  Denies polyuria, polydipsia , polyphagia or weight change Psych:   Denies depression, insomnia or hallucinations  Other:  All other systems negative   VITAL SIGNS: Temp:  [97.5 F (36.4 C)-98.1 F (36.7 C)] 97.5 F (36.4 C) (10/13 0800) Pulse Rate:  [100-118] 114 (10/13 0500) Resp:  [8-49] 35 (10/13 0500) BP: (104-139)/(70-109) 104/70 (10/13 0500) SpO2:  [87 %-99 %] 87 % (10/13 0500)   I/O last 3 completed shifts: In: 914.2 [I.V.:434.6; IV Piggyback:479.7] Out: 1500 [Urine:1500] No intake/output data recorded.   SpO2: (!) 87 % O2 Flow Rate (L/min): 5 L/min   Physical Examination:  GENERAL: in no acute distress HEAD: Normocephalic, atraumatic.  EYES: Pupils equal, round, reactive to light.  No scleral icterus.  MOUTH: Moist mucosal membrane. NECK: Supple. No JVD.  PULMONARY:mild crackles at bases bilaterally   CARDIOVASCULAR: S1 and S2. Regular rate and rhythm. No murmurs, rubs, or gallops.  GASTROINTESTINAL: Soft, nontender, -distended. No masses. Positive bowel sounds. No hepatosplenomegaly.  MUSCULOSKELETAL: No swelling, clubbing, or edema.  NEUROLOGIC: A&Ox3, responding appropriately SKIN:intact,warm,dry  I personally reviewed lab work that was obtained in last 24 hrs. CXR Independently reviewed  MEDICATIONS: I have reviewed all medications and confirmed regimen as documented   CULTURE RESULTS   Recent Results (from the past 240 hour(s))  Urine culture     Status: None   Collection Time: 04/04/19  7:37 PM   Specimen: Urine, Random  Result Value Ref Range Status   Specimen Description   Final    URINE, RANDOM Performed at [facility], [address] [place], [place] [address]    Special Requests   Final    NONE Performed at [facility], [address] [place], [place] [address]    Culture   Final    NO GROWTH Performed at [facility], [address] [place], [place] [address]    Report Status 04/05/2019 FINAL  Final  SARS CORONAVIRUS 2 (TAT 6-24 HRS) Nasopharyngeal Nasopharyngeal Swab     Status: None   Collection Time: 04/04/19  9:21 PM   Specimen: Nasopharyngeal Swab  Result Value Ref Range Status   SARS Coronavirus 2 NEGATIVE NEGATIVE Final    Comment: (NOTE) SARS-CoV-2 target nucleic acids are NOT DETECTED. The SARS-CoV-2 RNA is generally detectable in upper and lower respiratory specimens  during the acute phase of infection. Negative results do not preclude SARS-CoV-2 infection, do not rule out co-infections with other pathogens, and should not be used as the sole basis for treatment or other patient management decisions. Negative results must be combined with clinical observations, patient history, and epidemiological information. The expected result is Negative. Fact Sheet for  Patients: HairSlick.no Fact Sheet for Healthcare Providers: quierodirigir.com This test is not yet approved or cleared by the [place] FDA and  has been authorized for detection and/or diagnosis of SARS-CoV-2 by FDA under an Emergency Use Authorization (EUA). This EUA will remain  in effect (meaning this test can be used) for the duration of the COVID-19 declaration under Section 56 4(b)(1) of the Act, 21 U.S.C. section 360bbb-3(b)(1), unless the authorization is terminated or revoked sooner. Performed at [facility], [address], [place], [place] [address]   MRSA PCR Screening     Status: None   Collection Time: 04/10/19  3:39 PM   Specimen: Nasal Mucosa; Nasopharyngeal  Result Value Ref Range Status   MRSA by PCR NEGATIVE NEGATIVE Final    Comment:        The GeneXpert MRSA Assay (FDA approved for NASAL specimens only), is one component of a comprehensive MRSA colonization surveillance program. It is not intended to diagnose MRSA infection nor to guide or monitor treatment for MRSA infections. Performed at [facility], [address], [place], [place] [address]        ASSESSMENT AND PLAN SYNOPSIS 63 yo male admitted to the ICU with acute hypoxic respiratory failure and lethargy secondary to acute CHF exacerbation and CVA with multiple acute infarcts of left hemisphere and severe stenosis (99%) of left internal carotid artery  ACUTE Hypoxic and Hypercapnic Respiratory secondary to acute on chronic CHF exacerbation BIPAP as needed Oxygen as needed High risk for intubation  Acute CVA Severe stenosis (99%) of left internal carotid artery - vascular surgery consulted - plan is to schedule endarterectomy -discussed with vascular PA today - possible OR in am   Acute decompensated systolic cardiac failure - EF 09-81% -oxygen as needed -follow up cardiac enzymes as indicated -follow up  cardiology recs -Diuresis with Demadex and lasix -restrict fluids to 1200cc/24h -CXR in am   Aucte on chronic renal failure stage 3 -follow chem 7 -follow UO -Avoid nephrotoxic agents  NEUROLOGY - acute CVA with multiple infarcts of left hemisphere and severe stenosis of left internal carotid artery (99%) - follow with neurology recs  CARDIAC ICU monitoring  GI GI PROPHYLAXIS as indicated  ENDO - will use ICU hypoglycemic\Hyperglycemia protocol if needed  ELECTROLYTES -follow labs as needed -replace as needed -pharmacy consultation and following  DVT/GI PRX ordered TRANSFUSIONS AS NEEDED MONITOR FSBS ASSESS the need for LABS     Marinus Maw, PA-S   Vida Rigger, M.D.  Pulmonary & Critical Care Medicine  [facility]     Vida Rigger, M.D.  Pulmonary & Critical Care Medicine  [facility]   Critical care provider statement:   Critical care time (minutes): 32  Critical care time was exclusive of: Separately billable procedures and  treating other patients  Critical care was necessary to treat or prevent imminent or  life-threatening deterioration of the following conditions:  Acute hypoxemic respiratory failure, acute decompensated systolic CHF, acute CVA, multiple chronic comorbid conditions  Critical care was time spent personally by me on the following  activities: Development of treatment  plan with patient or surrogate,  discussions with consultants, evaluation of patient's response to  treatment, examination of patient, obtaining history from patient or  surrogate, ordering and performing treatments and interventions, ordering  and review of laboratory studies and re-evaluation of patient's condition  I assumed direction of critical care for this patient from another  provider in my specialty: no

## 2019-04-12 NOTE — Progress Notes (Signed)
[facility]  Daily Progress Note   Subjective  - 4 Days Post carotid angio Patient seen in ICU.  He does answer questions but with incomplete statements still appears confused.  Objective Vitals:   04/12/19 1500 04/12/19 1600 04/12/19 1700 04/12/19 1800  BP: (!) 127/106 (!) 144/102 (!) 135/97 (!) 136/98  Pulse: (!) 116 (!) 117 (!) 116 (!) 118  Resp:  (!) 25 (!) 26 10  Temp:  (!) 97.5 F (36.4 C)    TempSrc:  Oral    SpO2: 100% 99% 95%   Weight:      Height:        Intake/Output Summary (Last 24 hours) at 04/12/2019 1818 Last data filed at 04/12/2019 1433 Gross per 24 hour  Intake 265.17 ml  Output 625 ml  Net -359.83 ml    PULM  tachypneic, mild use of accessory muscles CV  No JVD, tachycardic Abd      No distended, nontender VASC  right-sided weakness  Laboratory CBC    Component Value Date/Time   WBC 6.4 04/12/2019 0309   HGB 11.4 (L) 04/12/2019 0309   HGB 12.5 (L) 12/11/2012 1214   HCT 37.0 (L) 04/12/2019 0309   HCT 38.4 (L) 12/11/2012 1214   PLT 130 (L) 04/12/2019 0309   PLT 167 12/11/2012 1214    BMET    Component Value Date/Time   NA 141 04/12/2019 0309   NA 137 09/09/2018 1059   NA 141 12/11/2012 1214   K 3.9 04/12/2019 0309   K 4.2 12/11/2012 1214   CL 108 04/12/2019 0309   CL 108 (H) 12/11/2012 1214   CO2 24 04/12/2019 0309   CO2 28 12/11/2012 1214   GLUCOSE 120 (H) 04/12/2019 0309   GLUCOSE 112 (H) 12/11/2012 1214   BUN 45 (H) 04/12/2019 0309   BUN 24 09/09/2018 1059   BUN 18 12/11/2012 1214   CREATININE 1.97 (H) 04/12/2019 0309   CREATININE 1.26 12/11/2012 1214   CALCIUM 8.7 (L) 04/12/2019 0309   CALCIUM 8.8 12/11/2012 1214   GFRNONAA 35 (L) 04/12/2019 0309   GFRNONAA >60 12/11/2012 1214   GFRAA 41 (L) 04/12/2019 0309   GFRAA >60 12/11/2012 1214    Assessment/Planning: POD #4 s/p carotid angio   Given the patient's subtotal occlusion of the left carotid bulb identified at angiography his lesion would make  him more appropriate for surgical endarterectomy.  My evaluation today reveals he continues to have significant cardiopulmonary issues.  Given the nature of his lesion repair should be performed this admission even though surgery in the face of an acute stroke carries increased risk is risk of further strokes if discharged would be incredibly high.  However, he still appears to require further optimization for surgery.    Belenda Cruise Aviel Davalos  04/12/2019, 6:18 PM

## 2019-04-12 NOTE — Progress Notes (Signed)
Patient complaining of not being able to breath. Patient refusing to keep oxygen on. Patient educated on importance of oxygen. The nasal canula is the first step to feeling better. Oxygen increases to high 90s when oxygen is actually used.

## 2019-04-12 NOTE — Consult Note (Signed)
ANTICOAGULATION CONSULT NOTE   Pharmacy Consult for Heparin Indication: atrial fibrillation  Patient Measurements: Height: 5\' 11"  (180.3 cm) Weight: 220 lb 0.3 oz (99.8 kg) IBW/kg (Calculated) : 75.3 Heparin Dosing Weight: 95.8 kg  Vital Signs: BP: 104/70 (10/13 0500) Pulse Rate: 114 (10/13 0500)  Labs: Recent Labs    04/10/19 0459 04/10/19 1205 04/10/19 1250  04/11/19 0747 04/11/19 1906 04/12/19 0309  HGB 12.1*  --   --   --  12.0*  --  11.4*  HCT 38.7*  --   --   --  38.5*  --  37.0*  PLT 129*  --   --   --  146*  --  130*  APTT  --  49*  --    < > 76* 119* >160*  LABPROT  --  46.8*  --   --   --   --   --   INR  --  5.2*  --   --   --   --   --   HEPARINUNFRC  --  >3.60*  --   --   --   --  3.24*  CREATININE 2.02*  --   --   --  2.12*  --  1.97*  TROPONINIHS 145*  --  131*  --   --   --   --    < > = values in this interval not displayed.    Estimated Creatinine Clearance: 46.8 mL/min (A) (by C-G formula based on SCr of 1.97 mg/dL (H)).   Medical History: Past Medical History:  Diagnosis Date  . CAD in native artery    a. stress echo 12/2007 abnl, EF > 55%, b. LHC 01/28/08: mLAD 30, D1 40, dLCx 70, pRCA 30, mRCA 70, mRCA lesion 2 80, PDA 90, s/p PCI/BMS to prox and distal RCA, s/p PCI/BMS to PDA; c. patient reports PCI/stenting x 2 in early 2017 at the 2018 (no records on file) d. 06/2016: cath showing patent stents along RCA and LCx with moderate 40% stenosis along the LAD.   07/2016 Carotid arterial disease (HCC)    a. 05/2018 CTA Head/neck: 80-90% stenosis @ L carotid bifurcation, extending into the prox LICA. 60% stenosis @ R carotid bifurcation. 50-75% bilateral distal cavernous carotid dzs.  . Chronic combined systolic (congestive) and diastolic (congestive) heart failure (HCC)    a. 2009 Echo: > 55%; b. 06/2016: EF 25-30%; c. 12/2017 Echo: EF 30-35%, diff HK; d. 05/2018 Echo: 30-35%, mild conc LVH, diff HK. Mild MR. Mildly dil LA/RA.  06/2018 Chronic kidney disease   . COPD  (chronic obstructive pulmonary disease) (HCC)   . Diabetes mellitus without complication (HCC)   . Gout   . Hyperlipidemia   . Hypertension   . Hypertensive heart disease   . Ischemic cardiomyopathy    a. 05/2018: 30-35%.  . Obesity   . Persistent atrial fibrillation (HCC) 01/2017   a. cardioverted 9/18 to NSR; b. 12/2017 noted to be back in Afib-outpt dccv rec; c. CHA2DS2VASc = 6-->Xarelto.  . Stroke (cerebrum) (HCC)    a. 05/2018 MRI: small patchy acute to subacute cortial and white matter infarct in the bilat cerebrum. Mild chronic small vessel ischmia; b. 05/2018 Carotid U/S: L-carotid bifurcation 80-90% stenosed, R- carotid bifurcation 60% stenosed.  . Tobacco abuse     Medications:  Medications Prior to Admission  Medication Sig Dispense Refill Last Dose  . atorvastatin (LIPITOR) 80 MG tablet Take 80 mg by mouth daily.   Past Week at  Unknown time  . budesonide-formoterol (SYMBICORT) 160-4.5 MCG/ACT inhaler Inhale 2 puffs into the lungs 2 (two) times daily.   Past Week at Unknown time  . carvedilol (COREG) 6.25 MG tablet Take 1 tablet (6.25 mg total) by mouth 2 (two) times daily with a meal. 60 tablet 1 Past Week at Unknown time  . magnesium oxide (MAG-OX) 400 MG tablet Take 400 mg by mouth 2 (two) times daily.   Past Week at Unknown time  . methocarbamol (ROBAXIN) 500 MG tablet Take 500 mg by mouth 4 (four) times daily.   Past Week at Unknown time  . nitroGLYCERIN (NITROSTAT) 0.4 MG SL tablet Place 0.4 mg under the tongue every 5 (five) minutes as needed for chest pain.   Unknown at PRN  . oxyCODONE (OXY IR/ROXICODONE) 5 MG immediate release tablet Take 5 mg by mouth 2 (two) times a day.   Past Week at Unknown time  . rivaroxaban (XARELTO) 20 MG TABS tablet Take 20 mg by mouth daily with supper.   Past Week at Unknown time  . sennosides-docusate sodium (SENOKOT-S) 8.6-50 MG tablet Take 2 tablets by mouth daily.   Past Week at Unknown time  . spironolactone (ALDACTONE) 25 MG tablet  Take 1 tablet (25 mg total) by mouth daily. 90 tablet 3 Past Week at Unknown time  . Tiotropium Bromide Monohydrate 2.5 MCG/ACT AERS Inhale 2 puffs into the lungs daily.   Past Week at Unknown time  . traZODone (DESYREL) 50 MG tablet Take 50 mg by mouth at bedtime.   Past Week at Unknown time  . docusate sodium (COLACE) 100 MG capsule Take 1 capsule (100 mg total) by mouth 2 (two) times daily. (Patient not taking: Reported on 04/05/2019) 60 capsule 6 Not Taking at Unknown time  . rivaroxaban (XARELTO) 15 MG TABS tablet Take 1 tablet (15 mg total) by mouth daily with supper. (Patient not taking: Reported on 04/05/2019) 30 tablet 1 Not Taking at Unknown time   Scheduled:  . atorvastatin  80 mg Oral Daily  . Chlorhexidine Gluconate Cloth  6 each Topical Q0600  . guaiFENesin  600 mg Oral BID  . insulin aspart  0-5 Units Subcutaneous QHS  . insulin aspart  0-9 Units Subcutaneous TID WC  . insulin glargine  10 Units Subcutaneous QHS  . magnesium oxide  400 mg Oral BID  . methocarbamol  500 mg Oral QID  . metoprolol tartrate  25 mg Oral BID  . polyethylene glycol  17 g Oral Daily  . senna-docusate  2 tablet Oral Daily  . tamsulosin  0.4 mg Oral Daily  . torsemide  20 mg Oral Daily   Infusions:  . sodium chloride Stopped (04/10/19 0742)  . heparin 1,150 Units/hr (04/12/19 0631)  . levETIRAcetam 250 mg (04/12/19 7253)   PRN: sodium chloride, bisacodyl, hydrALAZINE, nitroGLYCERIN, ondansetron (ZOFRAN) IV, ondansetron (ZOFRAN) IV, simethicone  Assessment: Pharmacy consulted to start heparin and hold rivaroxaban for possible procedure. Will use aPTT to monitor heparin as patient has been on rivaroxaban. Last dose of rivaroxaban 10/9 @1757 . H&H trending down, thrombocytopenic at baseline but appears to be stable  Goal of Therapy:  HL 0.3-0.7  Monitor platelets by anticoagulation protocol: Yes   Plan:   10/13 1221 aPTT > 160, supratherapeutic  Hold heparin infusion one hour then decrease  heparin drip to 850 units/hr  Heparin levels and aPTT are correlating at this time: check heparin level only going forward  CBC in am  Dallie Piles, PharmD 04/12/2019,8:35 AM

## 2019-04-12 NOTE — Consult Note (Signed)
ANTICOAGULATION CONSULT NOTE   Pharmacy Consult for Heparin Indication: atrial fibrillation  Allergies  Allergen Reactions  . Aspirin Other (See Comments)    Told not to take because of blood thinner  . Entresto [Sacubitril-Valsartan] Other (See Comments)    Dizziness, weakness and stuttering  . Ibuprofen Nausea And Vomiting  . Tylenol [Acetaminophen] Nausea And Vomiting    Patient Measurements: Height: 5\' 11"  (180.3 cm) Weight: 220 lb 0.3 oz (99.8 kg) IBW/kg (Calculated) : 75.3 Heparin Dosing Weight: 95.8 kg  Vital Signs: Temp: 98.1 F (36.7 C) (10/12 2000) Temp Source: Oral (10/12 2000) BP: 104/70 (10/13 0500) Pulse Rate: 114 (10/13 0500)  Labs: Recent Labs    04/10/19 0459 04/10/19 1205 04/10/19 1250  04/11/19 0747 04/11/19 1906 04/12/19 0309  HGB 12.1*  --   --   --  12.0*  --  11.4*  HCT 38.7*  --   --   --  38.5*  --  37.0*  PLT 129*  --   --   --  146*  --  130*  APTT  --  49*  --    < > 76* 119* >160*  LABPROT  --  46.8*  --   --   --   --   --   INR  --  5.2*  --   --   --   --   --   HEPARINUNFRC  --  >3.60*  --   --   --   --  3.24*  CREATININE 2.02*  --   --   --  2.12*  --  1.97*  TROPONINIHS 145*  --  131*  --   --   --   --    < > = values in this interval not displayed.    Estimated Creatinine Clearance: 46.8 mL/min (A) (by C-G formula based on SCr of 1.97 mg/dL (H)).   Medical History: Past Medical History:  Diagnosis Date  . CAD in native artery    a. stress echo 12/2007 abnl, EF > 55%, b. LHC 01/28/08: mLAD 30, D1 40, dLCx 70, pRCA 30, mRCA 70, mRCA lesion 2 80, PDA 90, s/p PCI/BMS to prox and distal RCA, s/p PCI/BMS to PDA; c. patient reports PCI/stenting x 2 in early 2017 at the 2018 (no records on file) d. 06/2016: cath showing patent stents along RCA and LCx with moderate 40% stenosis along the LAD.   07/2016 Carotid arterial disease (HCC)    a. 05/2018 CTA Head/neck: 80-90% stenosis @ L carotid bifurcation, extending into the prox LICA. 60%  stenosis @ R carotid bifurcation. 50-75% bilateral distal cavernous carotid dzs.  . Chronic combined systolic (congestive) and diastolic (congestive) heart failure (HCC)    a. 2009 Echo: > 55%; b. 06/2016: EF 25-30%; c. 12/2017 Echo: EF 30-35%, diff HK; d. 05/2018 Echo: 30-35%, mild conc LVH, diff HK. Mild MR. Mildly dil LA/RA.  06/2018 Chronic kidney disease   . COPD (chronic obstructive pulmonary disease) (HCC)   . Diabetes mellitus without complication (HCC)   . Gout   . Hyperlipidemia   . Hypertension   . Hypertensive heart disease   . Ischemic cardiomyopathy    a. 05/2018: 30-35%.  . Obesity   . Persistent atrial fibrillation (HCC) 01/2017   a. cardioverted 9/18 to NSR; b. 12/2017 noted to be back in Afib-outpt dccv rec; c. CHA2DS2VASc = 6-->Xarelto.  . Stroke (cerebrum) (HCC)    a. 05/2018 MRI: small patchy acute to subacute cortial and white  matter infarct in the bilat cerebrum. Mild chronic small vessel ischmia; b. 05/2018 Carotid U/S: L-carotid bifurcation 80-90% stenosed, R- carotid bifurcation 60% stenosed.  . Tobacco abuse     Medications:  Medications Prior to Admission  Medication Sig Dispense Refill Last Dose  . atorvastatin (LIPITOR) 80 MG tablet Take 80 mg by mouth daily.   Past Week at Unknown time  . budesonide-formoterol (SYMBICORT) 160-4.5 MCG/ACT inhaler Inhale 2 puffs into the lungs 2 (two) times daily.   Past Week at Unknown time  . carvedilol (COREG) 6.25 MG tablet Take 1 tablet (6.25 mg total) by mouth 2 (two) times daily with a meal. 60 tablet 1 Past Week at Unknown time  . magnesium oxide (MAG-OX) 400 MG tablet Take 400 mg by mouth 2 (two) times daily.   Past Week at Unknown time  . methocarbamol (ROBAXIN) 500 MG tablet Take 500 mg by mouth 4 (four) times daily.   Past Week at Unknown time  . nitroGLYCERIN (NITROSTAT) 0.4 MG SL tablet Place 0.4 mg under the tongue every 5 (five) minutes as needed for chest pain.   Unknown at PRN  . oxyCODONE (OXY IR/ROXICODONE) 5 MG  immediate release tablet Take 5 mg by mouth 2 (two) times a day.   Past Week at Unknown time  . rivaroxaban (XARELTO) 20 MG TABS tablet Take 20 mg by mouth daily with supper.   Past Week at Unknown time  . sennosides-docusate sodium (SENOKOT-S) 8.6-50 MG tablet Take 2 tablets by mouth daily.   Past Week at Unknown time  . spironolactone (ALDACTONE) 25 MG tablet Take 1 tablet (25 mg total) by mouth daily. 90 tablet 3 Past Week at Unknown time  . Tiotropium Bromide Monohydrate 2.5 MCG/ACT AERS Inhale 2 puffs into the lungs daily.   Past Week at Unknown time  . traZODone (DESYREL) 50 MG tablet Take 50 mg by mouth at bedtime.   Past Week at Unknown time  . docusate sodium (COLACE) 100 MG capsule Take 1 capsule (100 mg total) by mouth 2 (two) times daily. (Patient not taking: Reported on 04/05/2019) 60 capsule 6 Not Taking at Unknown time  . rivaroxaban (XARELTO) 15 MG TABS tablet Take 1 tablet (15 mg total) by mouth daily with supper. (Patient not taking: Reported on 04/05/2019) 30 tablet 1 Not Taking at Unknown time   Scheduled:  . atorvastatin  80 mg Oral Daily  . carvedilol  6.25 mg Oral BID WC  . Chlorhexidine Gluconate Cloth  6 each Topical Q0600  . guaiFENesin  600 mg Oral BID  . insulin aspart  0-5 Units Subcutaneous QHS  . insulin aspart  0-9 Units Subcutaneous TID WC  . insulin glargine  10 Units Subcutaneous QHS  . magnesium oxide  400 mg Oral BID  . methocarbamol  500 mg Oral QID  . polyethylene glycol  17 g Oral Daily  . senna-docusate  2 tablet Oral Daily  . tamsulosin  0.4 mg Oral Daily  . torsemide  20 mg Oral Daily   Infusions:  . sodium chloride Stopped (04/10/19 0742)  . heparin    . levETIRAcetam Stopped (04/11/19 1831)   PRN: sodium chloride, bisacodyl, hydrALAZINE, nitroGLYCERIN, ondansetron (ZOFRAN) IV, ondansetron (ZOFRAN) IV, simethicone  Assessment: Pharmacy consulted to start heparin and hold rivaroxaban for possible procedure. Will use aPTT to monitor heparin as  patient has been on rivaroxaban. Last dose of rivaroxaban 10/9 @1757   Goal of Therapy:  aPTT 66-102 seconds and HL 0.3-0.7 once aPTT and heparin level  correlate.  Monitor platelets by anticoagulation protocol: Yes   Plan:  10/12 1906 APTT 119, supratherapeutic. Will decrease heparin drip to 1350 units/hr and check APTT at 0300. 10/13 0309 aPTT > 160, supratherapeutic.  Hold Heparin x 1 hour then restart at reduced rate of 1150 units/hr, check aPTT 6 hours after infusion restarted.  Wayland DenisHall, Shane Badeaux A, PharmD 04/12/2019,5:11 AM

## 2019-04-12 NOTE — Consult Note (Signed)
Consultation Note Date: 04/12/2019   Patient Name: [name]  DOB: 29-Jun-1956  MRN: [id number]  Age / Sex: 63 y.o., male  PCP: [facility] [place] Referring Physician: Otila Back, MD  Reason for Consultation: Establishing goals of care  HPI/Patient Profile: 63 y.o. male  with past medical history of high blood pressure and cholesterol, hypertensive heart disease, ischemic cardiomyopathy, persistent A. fib, CAD in native artery, chronic combined heart failure, carotid artery disease, cerebrum stroke December 2019, diabetes, COPD with tobacco abuse, CKD, admitted on 04/04/2019 with acute CVA with severe stenosis (99%) of left internal carotid artery, vascular surgeon scheduling carotid endarterectomy 10/14.   Clinical Assessment and Goals of Care: I have reviewed medical records including EPIC notes, labs and imaging, received report from nursing staff, assessed the patient and then met at the bedside along with wife of 44 years, [name], to discuss diagnosis prognosis, GOC, EOL wishes, disposition and options.  I introduced Palliative Medicine as specialized medical care for people living with serious illness. It focuses on providing relief from the symptoms and stress of a serious illness. The goal is to improve quality of life for both the patient and the family.  We discussed a brief life review of the patient.  [name] and [name] have been together 25 years, married 20 years.  They share that [name] is a veteran, but has been unable to see his [place] doctor since January of this year.  She states that he has PTSD from service.  When I ask where he served he states [employer]".  Ask if he served in combat overseas and he states that he served in [place].  I try to clarify this with [name], but she just shakes her head no.  As far as functional and nutritional status, wife [name] states that [name] had started a  physical decline in February of this year.  She states that at that time they completed healthcare power of attorney paperwork and advanced directives.   We talked about "what if's and maybe's".  [name] states that her 2 year old son and 80 year old grandson live in their home along with 3 other grandchildren.  She shares that they have plenty of help in caring for [name].  She also states that they have all needed equipment.  We discussed current illness and what it means in the larger context of on-going co-morbidities.  Natural disease trajectory and expectations at EOL were discussed.   I attempted to elicit values and goals of care important to the patient.  [name] shares that even though [name] is unable to do as much physically as he would like, he still loves to get out, ride in the car, and will go on outings with her even if he has to sit in the car while she runs errands.  Advanced directives, concepts specific to code status, and rehospitalization were considered and discussed.  At this point, full scope/full code.  [name] shares that she believes [name] had elected to allow a natural death (DNR) when he completed paperwork in  February, but she is not sure.  She states that at this time, they would like full scope/full code.  [name] states that she feels she is able to talk with her husband of 20 years about any subject.  She states that she will respect his wishes about what he does and does not want with his medical treatment/life.  Questions and concerns were addressed.  The family was encouraged to call with questions or concerns.  PMT to continue to follow.   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    SUMMARY OF RECOMMENDATIONS   Full scope/full code Agreeable to CEA tomorrow   Code Status/Advance Care Planning:  Full code -we talked about CODE STATUS including, "treat the treatable, but allow a natural death".  Wife [name] states that when [name]  started to decline physically in February they completed healthcare power of attorney and advanced directives.  She states that she believes that time he chose (DNR) allowing natural death.  At this point [name] states that they would want CPR/life support.  I encourage them to consider "for how long?".  2 weeks, 2 months, 2 years?  Symptom Management:   Per hospitalist, no additional needs at this time.  Palliative Prophylaxis:   Frequent Pain Assessment and Turn Reposition  Additional Recommendations (Limitations, Scope, Preferences):  Full Scope Treatment  Psycho-social/Spiritual:   Desire for further Chaplaincy support:no  Additional Recommendations: Caregiving  Support/Resources and ICU Family Guide  Prognosis:  Unable to determine, based on outcomes.  1 year or less would not be surprising based on decreasing functional status, chronic illness burden, multiple hospitalizations.  Discharge Planning: Open to rehab if needed, no to long-term care.      Primary Diagnoses: Present on Admission:  Stroke ([place])  Persistent atrial fibrillation (HCC)  HTN (hypertension)  HLD (hyperlipidemia)  HFrEF (heart failure with reduced ejection fraction) (HCC)  COPD (chronic obstructive pulmonary disease) ([place])  CAD in native artery   I have reviewed the medical record, interviewed the patient and family, and examined the patient. The following aspects are pertinent.  Past Medical History:  Diagnosis Date   CAD in native artery    a. stress echo 12/2007 abnl, EF > 55%, b. LHC 01/28/08: mLAD 30, D1 40, dLCx 49, pRCA 30, mRCA 70, mRCA lesion 2 80, PDA 90, s/p PCI/BMS to prox and distal RCA, s/p PCI/BMS to PDA; c. patient reports PCI/stenting x 2 in early 2017 at the [place] (no records on file) d. 06/2016: cath showing patent stents along RCA and LCx with moderate 40% stenosis along the LAD.    Carotid arterial disease ([place])    a. 05/2018 CTA Head/neck: 80-90% stenosis @ L carotid  bifurcation, extending into the prox LICA. 60% stenosis @ R carotid bifurcation. 50-75% bilateral distal cavernous carotid dzs.   Chronic combined systolic (congestive) and diastolic (congestive) heart failure ([place])    a. 2009 Echo: > 55%; b. 06/2016: EF 25-30%; c. 12/2017 Echo: EF 30-35%, diff HK; d. 05/2018 Echo: 30-35%, mild conc LVH, diff HK. Mild MR. Mildly dil LA/RA.   Chronic kidney disease    COPD (chronic obstructive pulmonary disease) (HCC)    Diabetes mellitus without complication ([place])    Gout    Hyperlipidemia    Hypertension    Hypertensive heart disease    Ischemic cardiomyopathy    a. 05/2018: 30-35%.   Obesity    Persistent atrial fibrillation ([place]) 01/2017   a. cardioverted 9/18 to NSR;  b. 12/2017 noted to be back in Afib-outpt dccv rec; c. CHA2DS2VASc = 6-->Xarelto.   Stroke (cerebrum) ([place])    a. 05/2018 MRI: small patchy acute to subacute cortial and white matter infarct in the bilat cerebrum. Mild chronic small vessel ischmia; b. 05/2018 Carotid U/S: L-carotid bifurcation 80-90% stenosed, R- carotid bifurcation 60% stenosed.   Tobacco abuse    Social History   Socioeconomic History   Marital status: Married    Spouse name: Not on file   Number of children: Not on file   Years of education: Not on file   Highest education level: Not on file  Occupational History   Not on file  Social Needs   Financial resource strain: Patient refused   Food insecurity    Worry: Patient refused    Inability: Patient refused   Transportation needs    Medical: Patient refused    Non-medical: Patient refused  Tobacco Use   Smoking status: Former Smoker    Packs/day: 1.00    Years: 35.00    Pack years: 35.00    Types: Cigarettes   Smokeless tobacco: Never Used  Substance and Sexual Activity   Alcohol use: Not Currently    Comment: Quit 8 yrs.  Used to drink heavily   Drug use: Yes    Comment: prescribed oxy   Sexual activity: Not on file    Lifestyle   Physical activity    Days per week: Patient refused    Minutes per session: Patient refused   Stress: Patient refused  Relationships   Social connections    Talks on phone: Patient refused    Gets together: Patient refused    Attends religious service: Patient refused    Active member of club or organization: Patient refused    Attends meetings of clubs or organizations: Patient refused    Relationship status: Patient refused  Other Topics Concern   Not on file  Social History Narrative   Lives locally with wife.  Does not routinely exercise.   Family History  Problem Relation Age of Onset   Heart attack Mother 59   Hypertension Mother    Cancer Brother    Scheduled Meds:  atorvastatin  80 mg Oral Daily   Chlorhexidine Gluconate Cloth  6 each Topical Q0600   guaiFENesin  600 mg Oral BID   insulin aspart  0-5 Units Subcutaneous QHS   insulin aspart  0-9 Units Subcutaneous TID WC   insulin glargine  10 Units Subcutaneous QHS   magnesium oxide  400 mg Oral BID   methocarbamol  500 mg Oral QID   metoprolol tartrate  25 mg Oral BID   polyethylene glycol  17 g Oral Daily   senna-docusate  2 tablet Oral Daily   tamsulosin  0.4 mg Oral Daily   torsemide  20 mg Oral Daily   Continuous Infusions:  sodium chloride Stopped (04/10/19 0742)   heparin 1,150 Units/hr (04/12/19 1113)   levETIRAcetam 250 mg (04/12/19 6962)   PRN Meds:.sodium chloride, bisacodyl, hydrALAZINE, nitroGLYCERIN, ondansetron (ZOFRAN) IV, ondansetron (ZOFRAN) IV, simethicone Medications Prior to Admission:  Prior to Admission medications   Medication Sig Start Date End Date Taking? Authorizing Provider  atorvastatin (LIPITOR) 80 MG tablet Take 80 mg by mouth daily.   Yes [provider]  budesonide-formoterol (SYMBICORT) 160-4.5 MCG/ACT inhaler Inhale 2 puffs into the lungs 2 (two) times daily.   Yes [provider]  carvedilol (COREG) 6.25 MG tablet Take  1 tablet (6.25 mg total) by  mouth 2 (two) times daily with a meal. 07/31/18  Yes [name], MD  magnesium oxide (MAG-OX) 400 MG tablet Take 400 mg by mouth 2 (two) times daily.   Yes [provider]  methocarbamol (ROBAXIN) 500 MG tablet Take 500 mg by mouth 4 (four) times daily.   Yes [provider]  nitroGLYCERIN (NITROSTAT) 0.4 MG SL tablet Place 0.4 mg under the tongue every 5 (five) minutes as needed for chest pain.   Yes [provider]  oxyCODONE (OXY IR/ROXICODONE) 5 MG immediate release tablet Take 5 mg by mouth 2 (two) times a day. 01/14/19  Yes [provider]  rivaroxaban (XARELTO) 20 MG TABS tablet Take 20 mg by mouth daily with supper.   Yes [provider]  sennosides-docusate sodium (SENOKOT-S) 8.6-50 MG tablet Take 2 tablets by mouth daily.   Yes [provider]  spironolactone (ALDACTONE) 25 MG tablet Take 1 tablet (25 mg total) by mouth daily. 09/09/18  Yes Dunn, Areta Haber, PA-C  Tiotropium Bromide Monohydrate 2.5 MCG/ACT AERS Inhale 2 puffs into the lungs daily.   Yes [provider]  traZODone (DESYREL) 50 MG tablet Take 50 mg by mouth at bedtime.   Yes [provider]  docusate sodium (COLACE) 100 MG capsule Take 1 capsule (100 mg total) by mouth 2 (two) times daily. Patient not taking: Reported on 04/05/2019 09/02/18   Alisa Graff, FNP  rivaroxaban (XARELTO) 15 MG TABS tablet Take 1 tablet (15 mg total) by mouth daily with supper. Patient not taking: Reported on 04/05/2019 08/20/18   Fritzi Mandes, MD   Allergies  Allergen Reactions   Aspirin Other (See Comments)    Told not to take because of blood thinner   Entresto [Sacubitril-Valsartan] Other (See Comments)    Dizziness, weakness and stuttering   Ibuprofen Nausea And Vomiting   Tylenol [Acetaminophen] Nausea And Vomiting   Review of Systems  Unable to perform ROS: Acuity of condition    Physical Exam Vitals signs and nursing note  reviewed.     Vital Signs: BP 104/70    Pulse (!) 114    Temp (!) 97.5 F (36.4 C) (Oral)    Resp (!) 35    Ht _0  (1.803 m)    Wt 99.8 kg    SpO2 (!) 87%    BMI 30.69 kg/m  Pain Scale: 0-10 POSS *See Group Information*: S-Acceptable,Sleep, easy to arouse Pain Score: 0-No pain   SpO2: SpO2: (!) 87 % O2 Device:SpO2: (!) 87 % O2 Flow Rate: .O2 Flow Rate (L/min): 5 L/min  IO: Intake/output summary:   Intake/Output Summary (Last 24 hours) at 04/12/2019 1202 Last data filed at 04/12/2019 0200 Gross per 24 hour  Intake 337.27 ml  Output 800 ml  Net -462.73 ml    LBM: Last BM Date: 04/08/19 Baseline Weight: Weight: 99.8 kg Most recent weight: Weight: 99.8 kg     Palliative Assessment/Data:   Flowsheet Rows     Most Recent Value  Intake Tab  Referral Department  Critical care  Unit at Time of Referral  Intermediate Care Unit  Palliative Care Primary Diagnosis  Cardiac  Date Notified  04/11/19  Palliative Care Type  New Palliative care  Reason for referral  Clarify Goals of Care  Date of Admission  04/04/19  Date first seen by Palliative Care  04/12/19  # of days Palliative referral response time  1 Day(s)  # of days IP prior to Palliative referral  7  Clinical  Assessment  Palliative Performance Scale Score  30%  Pain Max last 24 hours  Not able to report  Pain Min Last 24 hours  Not able to report  Dyspnea Max Last 24 Hours  Not able to report  Dyspnea Min Last 24 hours  Not able to report  Psychosocial & Spiritual Assessment  Palliative Care Outcomes      Time In: 0950 Time Out: 1100 Time Total: 70 minutes Greater than 50%  of this time was spent counseling and coordinating care related to the above assessment and plan.  Signed by: Drue Novel, NP   Please contact Palliative Medicine Team phone at [phone] for questions and concerns.  For individual provider: See Shea Evans

## 2019-04-13 ENCOUNTER — Inpatient Hospital Stay: Payer: No Typology Code available for payment source

## 2019-04-13 LAB — CBC
HCT: 37.2 % — ABNORMAL LOW (ref 39.0–52.0)
Hemoglobin: 11.2 g/dL — ABNORMAL LOW (ref 13.0–17.0)
MCH: 29.2 pg (ref 26.0–34.0)
MCHC: 30.1 g/dL (ref 30.0–36.0)
MCV: 96.9 fL (ref 80.0–100.0)
Platelets: 147 10*3/uL — ABNORMAL LOW (ref 150–400)
RBC: 3.84 MIL/uL — ABNORMAL LOW (ref 4.22–5.81)
RDW: 22.5 % — ABNORMAL HIGH (ref 11.5–15.5)
WBC: 6.8 10*3/uL (ref 4.0–10.5)
nRBC: 2.5 % — ABNORMAL HIGH (ref 0.0–0.2)

## 2019-04-13 LAB — HEPARIN LEVEL (UNFRACTIONATED)
Heparin Unfractionated: 1.26 IU/mL — ABNORMAL HIGH (ref 0.30–0.70)
Heparin Unfractionated: 1.06 IU/mL — ABNORMAL HIGH (ref 0.30–0.70)
Heparin Unfractionated: 0.79 IU/mL — ABNORMAL HIGH (ref 0.30–0.70)

## 2019-04-13 LAB — BASIC METABOLIC PANEL
Anion gap: 11 (ref 5–15)
BUN: 42 mg/dL — ABNORMAL HIGH (ref 8–23)
CO2: 25 mmol/L (ref 22–32)
Calcium: 9 mg/dL (ref 8.9–10.3)
Chloride: 109 mmol/L (ref 98–111)
Creatinine, Ser: 2.09 mg/dL — ABNORMAL HIGH (ref 0.61–1.24)
GFR calc Af Amer: 38 mL/min — ABNORMAL LOW (ref >60)
GFR calc non Af Amer: 33 mL/min — ABNORMAL LOW (ref >60)
Glucose, Bld: 127 mg/dL — ABNORMAL HIGH (ref 70–99)
Potassium: 3.9 mmol/L (ref 3.5–5.1)
Sodium: 145 mmol/L (ref 135–145)

## 2019-04-13 LAB — GLUCOSE, CAPILLARY
Glucose-Capillary: 108 mg/dL — ABNORMAL HIGH (ref 70–99)
Glucose-Capillary: 105 mg/dL — ABNORMAL HIGH (ref 70–99)
Glucose-Capillary: 116 mg/dL — ABNORMAL HIGH (ref 70–99)
Glucose-Capillary: 163 mg/dL — ABNORMAL HIGH (ref 70–99)
Glucose-Capillary: 137 mg/dL — ABNORMAL HIGH (ref 70–99)

## 2019-04-13 LAB — MAGNESIUM: Magnesium: 2.9 mg/dL — ABNORMAL HIGH (ref 1.7–2.4)

## 2019-04-13 MED ORDER — HEPARIN (PORCINE) 25000 UT/250ML-% IV SOLN
600.0000 [IU]/h | INTRAVENOUS | Status: DC
  Administered 2019-04-13: 650 [IU]/h via INTRAVENOUS
  Administered 2019-04-14: 400 [IU]/h via INTRAVENOUS
  Filled 2019-04-13: qty 250

## 2019-04-13 NOTE — Progress Notes (Signed)
[facility] [place] NAME: [name]    MR#:  [id number]  DATE OF BIRTH:  01-19-1956  SUBJECTIVE:  No acute events overnight  REVIEW OF SYSTEMS:    Unable to obtain from CVA the only thing he repeats is yes   DRUG ALLERGIES:   Allergies  Allergen Reactions   Aspirin Other (See Comments)    Told not to take because of blood thinner   Entresto [Sacubitril-Valsartan] Other (See Comments)    Dizziness, weakness and stuttering   Ibuprofen Nausea And Vomiting   Tylenol [Acetaminophen] Nausea And Vomiting    VITALS:  Blood pressure (!) 149/99, pulse (!) 117, temperature (!) 97.4 F (36.3 C), temperature source Axillary, resp. rate (!) 29, height 5\' 11"  (1.803 m), weight 99.8 kg, SpO2 97 %.  PHYSICAL EXAMINATION:   Physical Exam  GENERAL:  63 y.o.-year-old patient lying in bed in no acute distress.  EYES: Pupils equal, round, reactive to light and accommodation. No scleral icterus. Extraocular muscles intact.  HEENT: Head atraumatic, normocephalic. Oropharynx and nasopharynx clear.  NECK:  Supple, no jugular venous distention. No thyroid enlargement, no tenderness.  LUNGS: Normal breath sounds bilaterally, no wheezing, rales, rhonchi. No use of accessory muscles of respiration.  CARDIOVASCULAR: IRR, IRR. No murmurs, rubs, or gallops.  ABDOMEN: Soft, nontender, nondistended. Bowel sounds present. No organomegaly or mass.  EXTREMITIES: No cyanosis, clubbing or edema b/l.    NEUROLOGIC: Patient with slight facial droop and right upper extremity weakness 1 out of 5 right lower extremity 2 out of 5  PSYCHIATRIC: The patient is alert however only says yes at this time.  SKIN: No obvious rash, lesion, or ulcer.    LABORATORY PANEL:   CBC Recent Labs  Lab 04/13/19 0554  WBC 6.8  HGB 11.2*  HCT 37.2*  PLT 147*    ------------------------------------------------------------------------------------------------------------------  Chemistries  Recent Labs  Lab 04/13/19 0554  NA 145  K 3.9  CL 109  CO2 25  GLUCOSE 127*  BUN 42*  CREATININE 2.09*  CALCIUM 9.0  MG 2.9*   ------------------------------------------------------------------------------------------------------------------  Cardiac Enzymes No results for input(s): TROPONINI in the last 168 hours. ------------------------------------------------------------------------------------------------------------------  RADIOLOGY:  US Venous Img Lower Bilateral  Result Date: 04/13/2019 CLINICAL DATA:  Leg swelling.  Shortness of breath. EXAM: BILATERAL LOWER EXTREMITY VENOUS DOPPLER ULTRASOUND TECHNIQUE: Gray-scale sonography with graded compression, as well as color Doppler and duplex ultrasound were performed to evaluate the lower extremity deep venous systems from the level of the common femoral vein and including the common femoral, femoral, profunda femoral, popliteal and calf veins including the posterior tibial, peroneal and gastrocnemius veins when visible. The superficial great saphenous vein was also interrogated. Spectral Doppler was utilized to evaluate flow at rest and with distal augmentation maneuvers in the common femoral, femoral and popliteal veins. COMPARISON:  None. FINDINGS: RIGHT LOWER EXTREMITY Common Femoral Vein: No evidence of thrombus. Normal compressibility, respiratory phasicity and response to augmentation. Saphenofemoral Junction: No evidence of thrombus. Normal compressibility and flow on color Doppler imaging. Profunda Femoral Vein: No evidence of thrombus. Normal compressibility and flow on color Doppler imaging. Femoral Vein: No evidence of thrombus. Normal compressibility, respiratory phasicity and response to augmentation. Popliteal Vein: No evidence of thrombus. Normal compressibility, respiratory phasicity and  response to augmentation. Calf Veins: No evidence of thrombus. Normal compressibility and flow on color Doppler imaging. Other Findings:  None. LEFT LOWER EXTREMITY Common Femoral Vein: No evidence of thrombus. Normal compressibility, respiratory  phasicity and response to augmentation. Saphenofemoral Junction: No evidence of thrombus. Normal compressibility and flow on color Doppler imaging. Profunda Femoral Vein: No evidence of thrombus. Normal compressibility and flow on color Doppler imaging. Femoral Vein: No evidence of thrombus. Normal compressibility, respiratory phasicity and response to augmentation. Popliteal Vein: No evidence of thrombus. Normal compressibility, respiratory phasicity and response to augmentation. Calf Veins: No evidence of thrombus. Normal compressibility and flow on color Doppler imaging. Other Findings:  None. IMPRESSION: No evidence of deep venous thrombosis in either lower extremity. Electronically Signed   By: Richarda Overlie M.D.   On: 04/13/2019 11:51   Dg Chest Port 1 View  Result Date: 04/13/2019 CLINICAL DATA:  63 year old male with COPD. EXAM: PORTABLE CHEST 1 VIEW COMPARISON:  Portable chest 04/11/2019 and earlier. FINDINGS: Portable AP semi upright view at 0253 hours. Stable lung volumes and cardiomegaly. Visualized tracheal air column is within normal limits. Pulmonary vascularity appears stable recently, improved since 04/09/2019. No pneumothorax, pleural effusion or confluent pulmonary opacity. No areas of worsening ventilation. No acute osseous abnormality identified. IMPRESSION: Stable ventilation since 04/11/2019. No new cardiopulmonary abnormality. Electronically Signed   By: Odessa Fleming M.D.   On: 04/13/2019 03:54     ASSESSMENT AND PLAN:   63 year old male with past medical history of persistent atrial fibrillation, ischemic cardiomyopathy, hypertension, hyperlipidemia, history of gout, diabetes, COPD, chronic combined systolic diastolic CHF, carotid artery disease  who presented to the hospital due to right upper extremity weakness and noted to have an acute CVA.  1.   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   As per neurology continue Xarelto.   CTA of the neck recently showing Severe soft and calcified plaque within the left carotid bifurcation and proximal ICA. Near complete occlusion versus complete occlusion with immediate reconstitution within the proximal left ICA. Given subtotal occlusion of left carotid bulb patient will need surgical endarterectomy  2.  Carotid artery stenosis:  Plan is for revascularization possibly with carotid endarterectomy this week.   Patient has been evaluated for preop clearance by cardiology.  Patient at moderate risk for moderate risk procedure.  No further testing or intervention and reduce this risk.   3. AMS/Encephalopathy ; improved  Patient had an episode of confusion on 04/08/2019.  Was reevaluated by neurology.  Neurology thinks this could possibly be seizures.  Repeat CT head done reviewed expected CT appearance of the scattered small left hemisphere infarcts seen by MRI previously.  Patient started on Keppra.  Neurology following.  4.  Persistent atrial flutter/flutter: Heparin in place of Xarelto Patient will resume DOAC prior to admission Continue Lopressor  5.  Acute on chronic systolic CHF exacerbation, EF 30 to 35% with LVH.  Patient is euvolemic Continue Lopressor Not currently on ACE inhibitor/ARB/Entresto in setting of permissive hypertension for CVA and acute kidney injury on chronic kidney disease  6. Hyperlipidemia - cont. Atorvastatin  7.  COPD without signs of exacerbation   8.  Diabetes type 2 with hyperglycemia- patient's A1c was as high as 8. Continue sliding scale  9.  BPH: Continue Flomax - Continue sliding scale insulin, low dose Lantus.  BS stable.    - await DM Coordinator  input.   10.  Acute on chronic kidney disease stage II: Creatinine improving with holding Lasix  11.  Cheyne-Stokes respiration: Patient transferred to ICU October 11.  DVT prophylaxis; patient now on heparin drip   Plan  of care discussed with patient  CODE STATUS: Full code  TOTAL TIME TAKING CARE OF THIS PATIENT: 28 minutes.   POSSIBLE D/C IN 4 DAYS, DEPENDING ON CLINICAL CONDITION.   Adrian SaranSital Krishan Mcbreen M.D on 04/13/2019 at 12:22 PM  Between 7am to 6pm - Pager - [phone]  After 6pm go to [email or web address] - Therapist, nutritionalpassword EPAS [facility]  [facility] [place] [facility]  Office  [phone]  CC: Primary care physician; [facility]

## 2019-04-13 NOTE — Progress Notes (Signed)
Initial Nutrition Assessment  DOCUMENTATION CODES:   Not applicable  INTERVENTION:  Provide Hormel Shake BID with lunch and dinner, each supplement provides 520 kcal and 22 grams of protein. Patient prefers vanilla.  NUTRITION DIAGNOSIS:   Increased nutrient needs related to catabolic illness(COPD, CHF) as evidenced by estimated needs.  GOAL:   Patient will meet greater than or equal to 90% of their needs  MONITOR:   PO intake, Supplement acceptance, Diet advancement, Labs, Weight trends, I & O's  REASON FOR ASSESSMENT:   LOS    ASSESSMENT:   63 year old male with PMHx of HLD, HTN, COPD, tobacco abuse, gout, CAD, CHF, A-fib, hx CVA, CKD who presented with right upper extremity weakness found to have multiple acute infarcts, carotid artery stenosis, AMS/encephalopathy, acute CHF exacerbation.   Met with patient at bedside. He is not the best historian. He reports his appetite is "okay" but he is not eating well. Per chart his PO intake is variable (50-100%). He was started on dysphagia 1 diet with nectar-thick liquids on 10/12. He is unsure of his weight history. According to weights in chart his weight has been trending up.  Medications reviewed and include: Novolog 0-9 units TID, Novolog 0-5 units QHS, Lantus 10 units QHS, magnesium oxide 400 mg BID, Miralax 17 grams daily, senna-docusate 2 tablets daily, Flomax, torsemide 40 mg daily, heparin gtt, Keppra.  Labs reviewed: CBG 105-108, BUN 42, Creatinine 2.09.  NUTRITION - FOCUSED PHYSICAL EXAM:  Unable to complete at this time as patient refused.  Diet Order:   Diet Order            DIET - DYS 1 Room service appropriate? Yes with Assist; Fluid consistency: Nectar Thick  Diet effective now             EDUCATION NEEDS:   Not appropriate for education at this time  Skin:  Skin Assessment: Reviewed RN Assessment  Last BM:  04/13/2019 - large type 4  Height:   Ht Readings from Last 1 Encounters:  04/05/19 5'  11" (1.803 m)   Weight:   Wt Readings from Last 1 Encounters:  04/05/19 99.8 kg   Ideal Body Weight:  78.2 kg  BMI:  Body mass index is 30.69 kg/m.  Estimated Nutritional Needs:   Kcal:  2100-2300  Protein:  105-115 grams  Fluid:  2.1 L/day  Willey Blade, MS, RD, LDN Office: [phone] Pager: [phone] After Hours/Weekend Pager: [phone]

## 2019-04-13 NOTE — Progress Notes (Signed)
Pt has been non-compliant today and semi-aggressive. Pt has been refusing to wear the bi-pap and becomes short of breath. Pt will then yell out that he can't breathe, despite his sats reading 98-100%, and he will wear the bi-pap for 3-5 minutes before removing same. Pt threw his bi-pap mask at one point today and stated he wasn't going to "wear this piece of shit".   Awaiting response from the vascular team on whether pt is going for an endarterectomy this admission or if it would be appropriate to move the pt out of the ICU.

## 2019-04-13 NOTE — Consult Note (Signed)
ANTICOAGULATION CONSULT NOTE   Pharmacy Consult for Heparin Indication: atrial fibrillation  Patient Measurements: Height: 5\' 11"  (180.3 cm) Weight: 220 lb 0.3 oz (99.8 kg) IBW/kg (Calculated) : 75.3 Heparin Dosing Weight: 95.8 kg  Vital Signs: Temp: 97.4 F (36.3 C) (10/13 2000) Temp Source: Axillary (10/13 2000) BP: 131/94 (10/14 0000) Pulse Rate: 116 (10/14 0000)  Labs: Recent Labs    04/10/19 0459 04/10/19 1205 04/10/19 1250  04/11/19 0747 04/11/19 1906 04/12/19 0309 04/12/19 1221 04/12/19 2153  HGB 12.1*  --   --   --  12.0*  --  11.4*  --   --   HCT 38.7*  --   --   --  38.5*  --  37.0*  --   --   PLT 129*  --   --   --  146*  --  130*  --   --   APTT  --  49*  --    < > 76* 119* >160* >160*  --   LABPROT  --  46.8*  --   --   --   --   --   --   --   INR  --  5.2*  --   --   --   --   --   --   --   HEPARINUNFRC  --  >3.60*  --   --   --   --  3.24*  --  1.26*  CREATININE 2.02*  --   --   --  2.12*  --  1.97*  --   --   TROPONINIHS 145*  --  131*  --   --   --   --   --   --    < > = values in this interval not displayed.    Estimated Creatinine Clearance: 46.8 mL/min (A) (by C-G formula based on SCr of 1.97 mg/dL (H)).  Medical History: Past Medical History:  Diagnosis Date  . CAD in native artery    a. stress echo 12/2007 abnl, EF > 55%, b. LHC 01/28/08: mLAD 30, D1 40, dLCx 70, pRCA 30, mRCA 70, mRCA lesion 2 80, PDA 90, s/p PCI/BMS to prox and distal RCA, s/p PCI/BMS to PDA; c. patient [facility] (no records on file) d. 06/2016: cath showing patent stents along RCA and LCx with moderate 40% stenosis along the LAD.   [name]. Carotid arterial disease (HCC)    a. 05/2018 CTA Head/neck: 80-90% stenosis @ L carotid bifurcation, extending into the prox LICA. 60% stenosis @ R carotid bifurcation. 50-75% bilateral distal cavernous carotid dzs.  . Chronic combined systolic (congestive) and diastolic (congestive) heart failure (HCC)    a. 2009 Echo: > 55%; b. 06/2016: EF 25-30%; c. 12/2017 Echo: EF 30-35%, diff HK; d. 05/2018 Echo: 30-35%, mild conc LVH, diff HK. Mild MR. Mildly dil LA/RA.  [name]. Chronic kidney disease   . COPD (chronic obstructive pulmonary disease) (HCC)   . Diabetes mellitus without complication (HCC)   . Gout   . Hyperlipidemia   . Hypertension   . Hypertensive heart disease   . Ischemic cardiomyopathy    a. 05/2018: 30-35%.  . Obesity   . Persistent atrial fibrillation (HCC) 01/2017   a. cardioverted 9/18 to NSR; b. 12/2017 noted to be back in Afib-outpt dccv rec; c. CHA2DS2VASc = 6-->Xarelto.  . Stroke (cerebrum) (HCC)    a. 05/2018 MRI: small patchy acute to subacute cortial and  white matter infarct in the bilat cerebrum. Mild chronic small vessel ischmia; b. 05/2018 Carotid U/S: L-carotid bifurcation 80-90% stenosed, R- carotid bifurcation 60% stenosed.  . Tobacco abuse     Medications:  Medications Prior to Admission  Medication Sig Dispense Refill Last Dose  . atorvastatin (LIPITOR) 80 MG tablet Take 80 mg by mouth daily.   Past Week at Unknown time  . budesonide-formoterol (SYMBICORT) 160-4.5 MCG/ACT inhaler Inhale 2 puffs into the lungs 2 (two) times daily.   Past Week at Unknown time  . carvedilol (COREG) 6.25 MG tablet Take 1 tablet (6.25 mg total) by mouth 2 (two) times daily with a meal. 60 tablet 1 Past Week at Unknown time  . magnesium oxide (MAG-OX) 400 MG tablet Take 400 mg by mouth 2 (two) times daily.   Past Week at Unknown time  . methocarbamol (ROBAXIN) 500 MG tablet Take 500 mg by mouth 4 (four) times daily.   Past Week at Unknown time  . nitroGLYCERIN (NITROSTAT) 0.4 MG SL tablet Place 0.4 mg under the tongue every 5 (five) minutes as needed for chest pain.   Unknown at PRN  . oxyCODONE (OXY IR/ROXICODONE) 5 MG immediate release tablet Take 5 mg by mouth 2 (two) times a day.   Past Week at Unknown time  . rivaroxaban (XARELTO) 20 MG TABS tablet Take 20 mg by mouth daily with supper.    Past Week at Unknown time  . sennosides-docusate sodium (SENOKOT-S) 8.6-50 MG tablet Take 2 tablets by mouth daily.   Past Week at Unknown time  . spironolactone (ALDACTONE) 25 MG tablet Take 1 tablet (25 mg total) by mouth daily. 90 tablet 3 Past Week at Unknown time  . Tiotropium Bromide Monohydrate 2.5 MCG/ACT AERS Inhale 2 puffs into the lungs daily.   Past Week at Unknown time  . traZODone (DESYREL) 50 MG tablet Take 50 mg by mouth at bedtime.   Past Week at Unknown time  . docusate sodium (COLACE) 100 MG capsule Take 1 capsule (100 mg total) by mouth 2 (two) times daily. (Patient not taking: Reported on 04/05/2019) 60 capsule 6 Not Taking at Unknown time  . rivaroxaban (XARELTO) 15 MG TABS tablet Take 1 tablet (15 mg total) by mouth daily with supper. (Patient not taking: Reported on 04/05/2019) 30 tablet 1 Not Taking at Unknown time   Scheduled:  . atorvastatin  80 mg Oral Daily  . Chlorhexidine Gluconate Cloth  6 each Topical Q0600  . guaiFENesin  600 mg Oral BID  . insulin aspart  0-5 Units Subcutaneous QHS  . insulin aspart  0-9 Units Subcutaneous TID WC  . insulin glargine  10 Units Subcutaneous QHS  . magnesium oxide  400 mg Oral BID  . methocarbamol  500 mg Oral QID  . metoprolol tartrate  25 mg Oral BID  . polyethylene glycol  17 g Oral Daily  . senna-docusate  2 tablet Oral Daily  . tamsulosin  0.4 mg Oral Daily  . torsemide  40 mg Oral Daily   Infusions:  . sodium chloride Stopped (04/10/19 0742)  . heparin Stopped (04/13/19 0046)  . levETIRAcetam Stopped (04/12/19 1909)   PRN: sodium chloride, bisacodyl, hydrALAZINE, nitroGLYCERIN, ondansetron (ZOFRAN) IV, ondansetron (ZOFRAN) IV, simethicone  Assessment: Pharmacy consulted to start heparin and hold rivaroxaban for possible procedure. Will use aPTT to monitor heparin as patient has been on rivaroxaban. Last dose of rivaroxaban 10/9 @1757 . H&H trending down, thrombocytopenic at baseline but appears to be stable  Goal of  Therapy:  HL 0.3-0.7  Monitor platelets by anticoagulation protocol: Yes   Plan:   10/13 1221 aPTT > 160, supratherapeutic  10/13 2153 HL 1.26, supratherapeutic  Hold heparin infusion one hour then decrease heparin drip to 650 units/hr  Heparin levels and aPTT are correlating at this time: check heparin level 6 hrs after heparin resumed  CBC in am  Wayland Denis, PharmD 04/13/2019,12:47 AM

## 2019-04-13 NOTE — Consult Note (Signed)
ANTICOAGULATION CONSULT NOTE   Pharmacy Consult for Heparin Indication: atrial fibrillation  Patient Measurements: Height: 5\' 11"  (180.3 cm) Weight: 220 lb 0.3 oz (99.8 kg) IBW/kg (Calculated) : 75.3 Heparin Dosing Weight: 95.8 kg  Vital Signs: Temp: 98.6 F (37 C) (10/14 1600) Temp Source: Oral (10/14 1600) BP: 140/96 (10/14 1800) Pulse Rate: 119 (10/14 1800)  Labs: Recent Labs    04/11/19 0747 04/11/19 1906  04/12/19 0309 04/12/19 1221 04/12/19 2153 04/13/19 0554 04/13/19 0746 04/13/19 1938  HGB 12.0*  --   --  11.4*  --   --  11.2*  --   --   HCT 38.5*  --   --  37.0*  --   --  37.2*  --   --   PLT 146*  --   --  130*  --   --  147*  --   --   APTT 76* 119*  --  >160* >160*  --   --   --   --   HEPARINUNFRC  --   --    < > 3.24*  --  1.26*  --  1.06* 0.79*  CREATININE 2.12*  --   --  1.97*  --   --  2.09*  --   --    < > = values in this interval not displayed.    Estimated Creatinine Clearance: 44.1 mL/min (A) (by C-G formula based on SCr of 2.09 mg/dL (H)).  Medical History: Past Medical History:  Diagnosis Date  . CAD in native artery    a. stress echo 12/2007 abnl, EF > 55%, b. LHC 01/28/08: mLAD 30, D1 40, dLCx 70, pRCA 30, mRCA 70, mRCA lesion 2 80, PDA 90, s/p PCI/BMS to prox and distal RCA, s/p PCI/BMS to PDA; c. patient reports PCI/stenting x 2 in early 2017 at the [facility] (no records on file) d. 06/2016: cath showing patent stents along RCA and LCx with moderate 40% stenosis along the LAD.   07/2016 Carotid arterial disease (HCC)    a. 05/2018 CTA Head/neck: 80-90% stenosis @ L carotid bifurcation, extending into the prox LICA. 60% stenosis @ R carotid bifurcation. 50-75% bilateral distal cavernous carotid dzs.  . Chronic combined systolic (congestive) and diastolic (congestive) heart failure (HCC)    a. 2009 Echo: > 55%; b. 06/2016: EF 25-30%; c. 12/2017 Echo: EF 30-35%, diff HK; d. 05/2018 Echo: 30-35%, mild conc LVH, diff HK. Mild MR. Mildly dil LA/RA.  06/2018 Chronic  kidney disease   . COPD (chronic obstructive pulmonary disease) (HCC)   . Diabetes mellitus without complication (HCC)   . Gout   . Hyperlipidemia   . Hypertension   . Hypertensive heart disease   . Ischemic cardiomyopathy    a. 05/2018: 30-35%.  . Obesity   . Persistent atrial fibrillation (HCC) 01/2017   a. cardioverted 9/18 to NSR; b. 12/2017 noted to be back in Afib-outpt dccv rec; c. CHA2DS2VASc = 6-->Xarelto.  . Stroke (cerebrum) (HCC)    a. 05/2018 MRI: small patchy acute to subacute cortial and white matter infarct in the bilat cerebrum. Mild chronic small vessel ischmia; b. 05/2018 Carotid U/S: L-carotid bifurcation 80-90% stenosed, R- carotid bifurcation 60% stenosed.  . Tobacco abuse     Medications:  Medications Prior to Admission  Medication Sig Dispense Refill Last Dose  . atorvastatin (LIPITOR) 80 MG tablet Take 80 mg by mouth daily.   Past Week at Unknown time  . budesonide-formoterol (SYMBICORT) 160-4.5 MCG/ACT inhaler Inhale 2 puffs into the  lungs 2 (two) times daily.   Past Week at Unknown time  . carvedilol (COREG) 6.25 MG tablet Take 1 tablet (6.25 mg total) by mouth 2 (two) times daily with a meal. 60 tablet 1 Past Week at Unknown time  . magnesium oxide (MAG-OX) 400 MG tablet Take 400 mg by mouth 2 (two) times daily.   Past Week at Unknown time  . methocarbamol (ROBAXIN) 500 MG tablet Take 500 mg by mouth 4 (four) times daily.   Past Week at Unknown time  . nitroGLYCERIN (NITROSTAT) 0.4 MG SL tablet Place 0.4 mg under the tongue every 5 (five) minutes as needed for chest pain.   Unknown at PRN  . oxyCODONE (OXY IR/ROXICODONE) 5 MG immediate release tablet Take 5 mg by mouth 2 (two) times a day.   Past Week at Unknown time  . rivaroxaban (XARELTO) 20 MG TABS tablet Take 20 mg by mouth daily with supper.   Past Week at Unknown time  . sennosides-docusate sodium (SENOKOT-S) 8.6-50 MG tablet Take 2 tablets by mouth daily.   Past Week at Unknown time  . spironolactone  (ALDACTONE) 25 MG tablet Take 1 tablet (25 mg total) by mouth daily. 90 tablet 3 Past Week at Unknown time  . Tiotropium Bromide Monohydrate 2.5 MCG/ACT AERS Inhale 2 puffs into the lungs daily.   Past Week at Unknown time  . traZODone (DESYREL) 50 MG tablet Take 50 mg by mouth at bedtime.   Past Week at Unknown time  . docusate sodium (COLACE) 100 MG capsule Take 1 capsule (100 mg total) by mouth 2 (two) times daily. (Patient not taking: Reported on 04/05/2019) 60 capsule 6 Not Taking at Unknown time  . rivaroxaban (XARELTO) 15 MG TABS tablet Take 1 tablet (15 mg total) by mouth daily with supper. (Patient not taking: Reported on 04/05/2019) 30 tablet 1 Not Taking at Unknown time   Scheduled:  . atorvastatin  80 mg Oral Daily  . Chlorhexidine Gluconate Cloth  6 each Topical Q0600  . guaiFENesin  600 mg Oral BID  . insulin aspart  0-5 Units Subcutaneous QHS  . insulin aspart  0-9 Units Subcutaneous TID WC  . insulin glargine  10 Units Subcutaneous QHS  . magnesium oxide  400 mg Oral BID  . methocarbamol  500 mg Oral QID  . metoprolol tartrate  25 mg Oral BID  . polyethylene glycol  17 g Oral Daily  . senna-docusate  2 tablet Oral Daily  . tamsulosin  0.4 mg Oral Daily  . torsemide  40 mg Oral Daily   Infusions:  . sodium chloride Stopped (04/10/19 0742)  . heparin 450 Units/hr (04/13/19 1800)  . levETIRAcetam 250 mg (04/13/19 1803)   PRN: sodium chloride, bisacodyl, hydrALAZINE, nitroGLYCERIN, ondansetron (ZOFRAN) IV, ondansetron (ZOFRAN) IV, simethicone  Assessment: Pharmacy consulted to start heparin and hold rivaroxaban for possible procedure. Will use aPTT to monitor heparin as patient has been on rivaroxaban. Last dose of rivaroxaban 10/9 @1757 . H&H trending down, thrombocytopenic at baseline but appears to be stable  Goal of Therapy:  HL 0.3-0.7  Monitor platelets by anticoagulation protocol: Yes   Plan:  10/14 1938 HL 0.79 supratherapeutic. Will decrease heparin drip to 400  units/hr. HL 10/15 at 0300. CBC with morning labs.  Tawnya Crook, PharmD 04/13/2019,8:18 PM

## 2019-04-13 NOTE — Progress Notes (Signed)
Updated pts wife via telephone regarding current pt condition and plan of care all questions were answered.  Marda Stalker, [place] Pager [phone] (please enter 7 digits) PCCM Consult Pager [phone] (please enter 7 digits)

## 2019-04-13 NOTE — Progress Notes (Signed)
Progress Note  Patient Name: [name] Date of Encounter: 04/13/2019  Primary Cardiologist: [facility]  Subjective   No chest pain, SOB, or palpitations. Confusion persists.   Inpatient Medications    Scheduled Meds: . atorvastatin  80 mg Oral Daily  . Chlorhexidine Gluconate Cloth  6 each Topical Q0600  . guaiFENesin  600 mg Oral BID  . insulin aspart  0-5 Units Subcutaneous QHS  . insulin aspart  0-9 Units Subcutaneous TID WC  . insulin glargine  10 Units Subcutaneous QHS  . magnesium oxide  400 mg Oral BID  . methocarbamol  500 mg Oral QID  . metoprolol tartrate  25 mg Oral BID  . polyethylene glycol  17 g Oral Daily  . senna-docusate  2 tablet Oral Daily  . tamsulosin  0.4 mg Oral Daily  . torsemide  40 mg Oral Daily   Continuous Infusions: . sodium chloride Stopped (04/10/19 0742)  . heparin 650 Units/hr (04/13/19 0500)  . levETIRAcetam 250 mg (04/13/19 4580)   PRN Meds: sodium chloride, bisacodyl, hydrALAZINE, nitroGLYCERIN, ondansetron (ZOFRAN) IV, ondansetron (ZOFRAN) IV, simethicone   Vital Signs    Vitals:   04/13/19 0300 04/13/19 0400 04/13/19 0500 04/13/19 0600  BP: (!) 137/96 (!) 155/96 (!) 132/96 (!) 149/99  Pulse: (!) 117 (!) 116 (!) 116 (!) 117  Resp: (!) 24 (!) 43 (!) 39 (!) 29  Temp:      TempSrc:      SpO2: 93% 94% 100% 97%  Weight:      Height:        Intake/Output Summary (Last 24 hours) at 04/13/2019 0840 Last data filed at 04/13/2019 0535 Gross per 24 hour  Intake 741.43 ml  Output 2075 ml  Net -1333.57 ml   Filed Weights   04/05/19 0618  Weight: 99.8 kg    Telemetry    Atrial flutter, 110s bpm, occasional PVCs - Personally Reviewed  ECG    No new tracings - Personally Reviewed  Physical Exam   GEN: No acute distress.   Neck: No JVD. Cardiac: Tachycardic, no murmurs, rubs, or gallops.  Respiratory: Clear to auscultation bilaterally.  GI: Soft, nontender, non-distended.   MS: No edema; No deformity.  Neuro:  Alert and oriented x 3; Nonfocal.  Psych: Normal affect.  Labs    Chemistry Recent Labs  Lab 04/11/19 0747 04/12/19 0309 04/13/19 0554  NA 141 141 145  K 4.2 3.9 3.9  CL 111 108 109  CO2 22 24 25   GLUCOSE 111* 120* 127*  BUN 46* 45* 42*  CREATININE 2.12* 1.97* 2.09*  CALCIUM 9.0 8.7* 9.0  GFRNONAA 32* 35* 33*  GFRAA 38* 41* 38*  ANIONGAP 8 9 11      Hematology Recent Labs  Lab 04/11/19 0747 04/12/19 0309 04/13/19 0554  WBC 6.3 6.4 6.8  RBC 4.09* 3.91* 3.84*  HGB 12.0* 11.4* 11.2*  HCT 38.5* 37.0* 37.2*  MCV 94.1 94.6 96.9  MCH 29.3 29.2 29.2  MCHC 31.2 30.8 30.1  RDW 22.2* 22.4* 22.5*  PLT 146* 130* 147*    Cardiac EnzymesNo results for input(s): TROPONINI in the last 168 hours. No results for input(s): TROPIPOC in the last 168 hours.   BNP Recent Labs  Lab 04/10/19 0459  BNP 1,018.0*     DDimer No results for input(s): DDIMER in the last 168 hours.   Radiology    Dg Chest Port 1 View  Result Date: 04/13/2019 IMPRESSION: Stable ventilation since 04/11/2019. No new cardiopulmonary abnormality.  Electronically Signed   By: Genevie Ann M.D.   On: 04/13/2019 03:54   Dg Chest Port 1 View  Result Date: 04/11/2019 IMPRESSION: Cardiomegaly, improving CHF. Electronically Signed   By: Rolm Baptise M.D.   On: 04/11/2019 11:34    Cardiac Studies   2D echo 04/05/2019: 1. Left ventricular ejection fraction, by visual estimation, is 30 to 35%. The left ventricle has moderately decreased function. Left ventricular septal wall thickness was moderately increased. Moderately increased left ventricular posterior wall thickness. There is moderately increased left ventricular hypertrophy. 2. Global right ventricle has normal systolic function.The right ventricular size is mildly enlarged. No increase in right ventricular wall thickness. 3. Left atrial size was mild-moderately dilated. 4. Right atrial size was mildly dilated. 5. The mitral valve is degenerative.  Mild mitral valve regurgitation. 6. The tricuspid valve is not well visualized. Tricuspid valve regurgitation is mild. 7. The aortic valve was not well visualized Aortic valve regurgitation is trivial by color flow Doppler. Mild aortic valve stenosis. 8. The pulmonic valve was not well visualized. Pulmonic valve regurgitation is trivial by color flow Doppler. 9. The aortic root was not well visualized. 10. The interatrial septum was not assessed.  Patient Profile     63 y.o. male with history of CAD status post prior stenting to the RCA andrPDA,HFrEF with an EF of 30 to 35% by echo in 05/2018 due to ICM, persistent A. fib/flutter on Xarelto,carotid artery disease status post recent stroke in12/2019 pending right sided CEA, CKD stage II,COPD secondary to tobacco abuse, diabetes, hypertension, hyperlipidemia, and obesityadmitted with acute CVA with noted near occlusion of the left ICA as noted on CTA neck.  Assessment & Plan    1. Perioperative cardiac risk evaluation: -He denies any symptoms concerning for angina -After discussion with MD, we will cancel stress test given [facility] respirations -He will be moderate risk for noncardiac procedure regardless, no further testing or intervention will reduce this risk  2. CAD involving the native coronary arteries: -No symptoms concerning for angina -Has been on Xarelto in place of ASA -Lipitor  3. Persistent Afib/flutter: -Ventricular rates are mildly tachycardic this morning following discontinuation of Cardizem given his cardiomyopathy -Heparin gtt in place of Xarelto while admitted, will resume DOAC prior to admission -Changed from Coreg to Lopressor for added rate control on 10/13, titrate is needed, with need for permissive HTN as well given recent CVA  4. CVA: -Permissive hypertension -Neurology on board  -Pending possible left-sided CEA this admission -Lipitor  5. Carotid artery disease: -Ambiguous CTA  neck/carotid artery ultrasound -Vascular surgery is considering CEA   6. HFrEF secondary to ICM: -Stopped diltiazem on 10/12 given his CM -He does not appear grossly volume up -Slight bump in BUN/SCr this morning -Continue to hold IV Lasix -Transitioned from Coreg to Lopressor as above for added rate control -When able, transition to Toprol or back to Coreg -Not currently on ACEi/ARB/spironolactone/Entresto in the setting of permissive hypertension need given recent CVA and AKI on CKD  7. Acute on CKD stage II: -Improving with holding of Lasix  8. [place] respirations: -Transferred to the ICU on 10/11 -High risk for intubation -Per PCCM   For questions or updates, please contact [place] Please consult [email or web address] for contact info under Cardiology/STEMI.    Signed, Christell Faith, PA-C [facility] Pager: [phone] 04/13/2019, 8:40 AM

## 2019-04-13 NOTE — Progress Notes (Signed)
Inpatient Rehabilitation Admissions Coordinator  I await follow up therapy assessments and CEA before I can assist with planning dispo options. I will follow.  Danne Baxter, RN, MSN Rehab Admissions Coordinator [phone] 04/13/2019 2:18 PM

## 2019-04-13 NOTE — Progress Notes (Signed)
Pt NPO after MN due to possible surgical procedure.  Dewaine Conger, NP aware

## 2019-04-13 NOTE — Progress Notes (Addendum)
Name: [name] MRN: [id number] DOB: 1956/05/17     CONSULTATION DATE: 04/13/2019  CHIEF COMPLAINT:  Acute respiratory failure  SIGNIFICANT EVENTS/STUDIES: 10/5Admitted to the hospital for right sided weakness, with left frontal infarct on CT and acute CHF exacerbation 10/6Echocardiogram demonstrated chronic systolic heart failure with EF 30-35% 10/8MRI demonstrated multiple acute infarcts within the left cerebral hemisphere involving the cortex. 10/11Transferred to ICU overnight because he had increased work of breathing and became somnolent 10/13 - mentation improved, plan for OR in am  10/14 - diuresed well overnight, noncompliant with therapy and BIPAP  HISTORY OF PRESENT ILLNESS:   [name] is a 63 yo male with history of CAD, known afib/flutter and cardiomyopathy with a prior stroke who presented to the ED complaining of weakness and was found to have an acute CVA with right upper extremity weakness. MRI demonstrated multiple acute infarcts within the left cerebral hemisphere. CT of neck showed soft calcified plaque near occlusion of left carotid which was confirmed by angiography. Anticipate surgical endarterectomy this week per vascular. Pt was transferred to ICU overnight 10/11 for increased work of breathing and somnolence.  Today, pt is alert and oriented but noncompliant with BiPAP. No definitive plans for endarterectomy.  PAST MEDICAL HISTORY :   has a past medical history of CAD in native artery, Carotid arterial disease (HCC), Chronic combined systolic (congestive) and diastolic (congestive) heart failure (HCC), Chronic kidney disease, COPD (chronic obstructive pulmonary disease) (HCC), Diabetes mellitus without complication (HCC), Gout, Hyperlipidemia, Hypertension, Hypertensive heart disease, Ischemic cardiomyopathy, Obesity, Persistent atrial fibrillation (HCC) (01/2017), Stroke (cerebrum) (HCC), and Tobacco abuse.  has a past surgical history that includes  Cardiac catheterization (2009); Cardiac catheterization (2010); Coronary angioplasty (2009); Cardiac catheterization (N/A, 07/28/2016); CARDIOVERSION (N/A, 03/27/2017); CARDIOVERSION (N/A, 07/30/2018); and CAROTID ANGIOGRAPHY (Bilateral, 04/08/2019). Prior to Admission medications   Medication Sig Start Date End Date Taking? Authorizing Provider  atorvastatin (LIPITOR) 80 MG tablet Take 80 mg by mouth daily.   Yes [provider]  budesonide-formoterol (SYMBICORT) 160-4.5 MCG/ACT inhaler Inhale 2 puffs into the lungs 2 (two) times daily.   Yes [provider]  carvedilol (COREG) 6.25 MG tablet Take 1 tablet (6.25 mg total) by mouth 2 (two) times daily with a meal. 07/31/18  Yes Altamese Dilling, MD  magnesium oxide (MAG-OX) 400 MG tablet Take 400 mg by mouth 2 (two) times daily.   Yes [provider]  methocarbamol (ROBAXIN) 500 MG tablet Take 500 mg by mouth 4 (four) times daily.   Yes [provider]  nitroGLYCERIN (NITROSTAT) 0.4 MG SL tablet Place 0.4 mg under the tongue every 5 (five) minutes as needed for chest pain.   Yes [provider]  oxyCODONE (OXY IR/ROXICODONE) 5 MG immediate release tablet Take 5 mg by mouth 2 (two) times a day. 01/14/19  Yes [provider]  rivaroxaban (XARELTO) 20 MG TABS tablet Take 20 mg by mouth daily with supper.   Yes [provider]  sennosides-docusate sodium (SENOKOT-S) 8.6-50 MG tablet Take 2 tablets by mouth daily.   Yes [provider]  spironolactone (ALDACTONE) 25 MG tablet Take 1 tablet (25 mg total) by mouth daily. 09/09/18  Yes Dunn, Raymon Mutton, PA-C  Tiotropium Bromide Monohydrate 2.5 MCG/ACT AERS Inhale 2 puffs into the lungs daily.   Yes [provider]  traZODone (DESYREL) 50 MG tablet Take 50 mg by mouth at bedtime.   Yes [provider]  docusate sodium (COLACE) 100 MG capsule Take 1 capsule (100 mg total)  by mouth 2 (two) times daily. Patient not taking:  Reported on 04/05/2019 09/02/18   Delma Freeze, FNP  rivaroxaban (XARELTO) 15 MG TABS tablet Take 1 tablet (15 mg total) by mouth daily with supper. Patient not taking: Reported on 04/05/2019 08/20/18   Enedina Finner, MD   Allergies  Allergen Reactions  . Aspirin Other (See Comments)    Told not to take because of blood thinner  . Entresto [Sacubitril-Valsartan] Other (See Comments)    Dizziness, weakness and stuttering  . Ibuprofen Nausea And Vomiting  . Tylenol [Acetaminophen] Nausea And Vomiting   Review of Systems:  Gen:  Denies  fever, sweats, chills weight loss  HEENT: Denies blurred vision, double vision, ear pain, eye pain, hearing loss, nose bleeds, sore throat Cardiac:  No dizziness, chest pain or heaviness, chest tightness,edema, No JVD Resp: Endorses SOB.  No cough, -sputum production, -wheezing, -hemoptysis,  Gi: Denies swallowing difficulty, stomach pain, nausea or vomiting, diarrhea, constipation, bowel incontinence Gu:  Denies bladder incontinence, burning urine Ext:   Denies Joint pain, stiffness or swelling Skin: Denies  skin rash, easy bruising or bleeding or hives Endoc:  Denies polyuria, polydipsia , polyphagia or weight change Psych:   Denies depression, insomnia or hallucinations  Other:  All other systems negative  VITAL SIGNS: Temp:  [97.4 F (36.3 C)-97.5 F (36.4 C)] 97.4 F (36.3 C) (10/13 2000) Pulse Rate:  [79-119] 117 (10/14 0600) Resp:  [10-43] 29 (10/14 0600) BP: (110-155)/(75-106) 149/99 (10/14 0600) SpO2:  [91 %-100 %] 97 % (10/14 0600)   I/O last 3 completed shifts: In: 1006.6 [P.O.:360; I.V.:337.6; IV Piggyback:309] Out: 2075 [Urine:2075] Total I/O In: 120 [P.O.:120] Out: -    SpO2: 97 % O2 Flow Rate (L/min): 5 L/min   Physical Examination:  GENERAL:critically ill appearing, with increased respiratory rate HEAD: Normocephalic, atraumatic.  EYES: Pupils equal, round, reactive to light.  No scleral icterus.  MOUTH: Moist mucosal  membrane. NECK: Supple. No JVD.  PULMONARY: mild pan-inspiratory crackles CARDIOVASCULAR: S1 and S2. Tachycardic. No murmurs, rubs, or gallops.  GASTROINTESTINAL: Soft, nontender, -distended. No masses. Positive bowel sounds. No hepatosplenomegaly.  MUSCULOSKELETAL: No swelling, clubbing, or edema.  NEUROLOGIC: A&Ox3 SKIN:intact,warm,dry  I personally reviewed lab work that was obtained in last 24 hrs. CXR Independently reviewed  MEDICATIONS: I have reviewed all medications and confirmed regimen as documented   CULTURE RESULTS   Recent Results (from the past 240 hour(s))  Urine culture     Status: None   Collection Time: 04/04/19  7:37 PM   Specimen: Urine, Random  Result Value Ref Range Status   Specimen Description   Final    URINE, RANDOM Performed at [facility], [address], [place], [place] [address]    Special Requests   Final    NONE Performed at [facility], [address], [place], [place] [address]    Culture   Final    NO GROWTH Performed at [facility], [address] [place], [place] [address]    Report Status 04/05/2019 FINAL  Final  SARS CORONAVIRUS 2 (TAT 6-24 HRS) Nasopharyngeal Nasopharyngeal Swab     Status: None   Collection Time: 04/04/19  9:21 PM   Specimen: Nasopharyngeal Swab  Result Value Ref Range Status   SARS Coronavirus 2 NEGATIVE NEGATIVE Final    Comment: (NOTE) SARS-CoV-2 target nucleic acids are NOT DETECTED. The SARS-CoV-2 RNA is generally detectable in upper and lower respiratory specimens during the acute phase of infection. Negative  results do not preclude SARS-CoV-2 infection, do not rule out co-infections with other pathogens, and should not be used as the sole basis for treatment or other patient management decisions. Negative results must be combined with clinical observations, patient history, and epidemiological information. The expected result is Negative. Fact Sheet for Patients:  SugarRoll.be Fact Sheet for Healthcare Providers: https://www.woods-mathews.com/ This test is not yet approved or cleared by the [place] FDA and  has been authorized for detection and/or diagnosis of SARS-CoV-2 by FDA under an Emergency Use Authorization (EUA). This EUA will remain  in effect (meaning this test can be used) for the duration of the COVID-19 declaration under Section 56 4(b)(1) of the Act, 21 U.S.C. section 360bbb-3(b)(1), unless the authorization is terminated or revoked sooner. Performed at [facility], [place] [address] [place], [place] [address]   MRSA PCR Screening     Status: None   Collection Time: 04/10/19  3:39 PM   Specimen: Nasal Mucosa; Nasopharyngeal  Result Value Ref Range Status   MRSA by PCR NEGATIVE NEGATIVE Final    Comment:        The GeneXpert MRSA Assay (FDA approved for NASAL specimens only), is one component of a comprehensive MRSA colonization surveillance program. It is not intended to diagnose MRSA infection nor to guide or monitor treatment for MRSA infections. Performed at [facility], [address] [place], [place] [address]           IMAGING    Dg Chest Port 1 View  Result Date: 04/13/2019 CLINICAL DATA:  63 year old male with COPD. EXAM: PORTABLE CHEST 1 VIEW COMPARISON:  Portable chest 04/11/2019 and earlier. FINDINGS: Portable AP semi upright view at 0253 hours. Stable lung volumes and cardiomegaly. Visualized tracheal air column is within normal limits. Pulmonary vascularity appears stable recently, improved since 04/09/2019. No pneumothorax, pleural effusion or confluent pulmonary opacity. No areas of worsening ventilation. No acute osseous abnormality identified. IMPRESSION: Stable ventilation since 04/11/2019. No new cardiopulmonary abnormality. Electronically Signed   By: Genevie Ann M.D.   On: 04/13/2019 03:54      ASSESSMENT AND PLAN SYNOPSIS 63 yo male  admitted to the ICU with acute hypoxic respiratory failure and lethargy secondary to acute CHF exacerbation and CVA with multiple acute infarcts of left hemisphere and severe stenosis (99%) of left internal carotid artery  ACUTE Hypoxic and Hypercapnic Respiratorysecondary toacute on chronicCHF exacerbation BIPAP is mandatory Oxygen as needed High risk for intubation - [place] LE due to report of sever dyspnea to r/o DVT/PE despite anticoag. Is negative -continue diuresis, patient is noncompliant with BIPAP - restrict fluid to <1200cc/24h  Acute CVA Severe stenosis (99%) of left internal carotid artery - vascular surgery consulted - plan is to schedule endarterectomy -no definitive plan currently   Acute on chronic decompensated systolic cardiac failure - EF 30-35% -oxygen as needed -follow up cardiac enzymes as indicated -follow up cardiology recs -Diuresis with Demadex and lasix -restrict fluids to 1200cc/24h -CXR - shows pulmonary edema   Aucte on chronic renal failure stage 3 -follow chem 7 -follow UO -Avoid nephrotoxic agents  NEUROLOGY -acute CVA with multiple infarcts of left hemisphere and severe stenosis of left internal carotid artery (99%) - follow with neurology recs  CARDIAC ICU monitoring  GI GI PROPHYLAXIS as indicated  NUTRITIONAL STATUS DIET-->TF's as tolerated Constipation protocol as indicated  ENDO - will use ICU hypoglycemic\Hyperglycemia protocol if needed  ELECTROLYTES -follow labs as needed -replace as needed -pharmacy consultation and following  DVT/GI PRX ordered TRANSFUSIONS AS NEEDED MONITOR FSBS ASSESS the need for LABS    Roda ShuttersNicole Powell Sullivan, PA-S    Vida RiggerFuad Johnn Krasowski, M.D.  Pulmonary &Critical Care Medicine  [facility]  Critical care provider statement:  Critical care time (minutes):32 Critical care time was exclusive of: Separately billable procedures and  treating other patients Critical  care was necessary to treat or prevent imminent or  life-threatening deterioration of the following conditions: Acute hypoxemic respiratory failure, acute decompensated systolic CHF, acute CVA, multiple chronic comorbid conditions Critical care was time spent personally by me on the following  activities: Development of treatment plan with patient or surrogate,  discussions with consultants, evaluation of patient's response to  treatment, examination of patient, obtaining history from patient or  surrogate, ordering and performing treatments and interventions, ordering  and review of laboratory studies and re-evaluation of patient's condition I assumed direction of critical care for this patient from another  provider in my specialty: no

## 2019-04-13 NOTE — Consult Note (Signed)
ANTICOAGULATION CONSULT NOTE   Pharmacy Consult for Heparin Indication: atrial fibrillation  Patient Measurements: Height: 5\' 11"  (180.3 cm) Weight: 220 lb 0.3 oz (99.8 kg) IBW/kg (Calculated) : 75.3 Heparin Dosing Weight: 95.8 kg  Vital Signs: Temp: 97.4 F (36.3 C) (10/13 2000) Temp Source: Axillary (10/13 2000) BP: 149/99 (10/14 0600) Pulse Rate: 117 (10/14 0600)  Labs: Recent Labs    04/10/19 1205 04/10/19 1250  04/11/19 0747 04/11/19 1906 04/12/19 0309 04/12/19 1221 04/12/19 2153 04/13/19 0554  HGB  --   --    < > 12.0*  --  11.4*  --   --  11.2*  HCT  --   --   --  38.5*  --  37.0*  --   --  37.2*  PLT  --   --   --  146*  --  130*  --   --  147*  APTT 49*  --    < > 76* 119* >160* >160*  --   --   LABPROT 46.8*  --   --   --   --   --   --   --   --   INR 5.2*  --   --   --   --   --   --   --   --   HEPARINUNFRC >3.60*  --   --   --   --  3.24*  --  1.26*  --   CREATININE  --   --   --  2.12*  --  1.97*  --   --  2.09*  TROPONINIHS  --  131*  --   --   --   --   --   --   --    < > = values in this interval not displayed.    Estimated Creatinine Clearance: 44.1 mL/min (A) (by C-G formula based on SCr of 2.09 mg/dL (H)).  Medical History: Past Medical History:  Diagnosis Date  . CAD in native artery    a. stress echo 12/2007 abnl, EF > 55%, b. LHC 01/28/08: mLAD 30, D1 40, dLCx 70, pRCA 30, mRCA 70, mRCA lesion 2 80, PDA 90, s/p PCI/BMS to prox and distal RCA, s/p PCI/BMS to PDA; c. patient reports PCI/stenting x 2 in early 2017 at the 2018 (no records on file) d. 06/2016: cath showing patent stents along RCA and LCx with moderate 40% stenosis along the LAD.   07/2016 Carotid arterial disease (HCC)    a. 05/2018 CTA Head/neck: 80-90% stenosis @ L carotid bifurcation, extending into the prox LICA. 60% stenosis @ R carotid bifurcation. 50-75% bilateral distal cavernous carotid dzs.  . Chronic combined systolic (congestive) and diastolic (congestive) heart failure (HCC)     a. 2009 Echo: > 55%; b. 06/2016: EF 25-30%; c. 12/2017 Echo: EF 30-35%, diff HK; d. 05/2018 Echo: 30-35%, mild conc LVH, diff HK. Mild MR. Mildly dil LA/RA.  06/2018 Chronic kidney disease   . COPD (chronic obstructive pulmonary disease) (HCC)   . Diabetes mellitus without complication (HCC)   . Gout   . Hyperlipidemia   . Hypertension   . Hypertensive heart disease   . Ischemic cardiomyopathy    a. 05/2018: 30-35%.  . Obesity   . Persistent atrial fibrillation (HCC) 01/2017   a. cardioverted 9/18 to NSR; b. 12/2017 noted to be back in Afib-outpt dccv rec; c. CHA2DS2VASc = 6-->Xarelto.  . Stroke (cerebrum) (HCC)    a. 05/2018 MRI: small patchy acute  to subacute cortial and white matter infarct in the bilat cerebrum. Mild chronic small vessel ischmia; b. 05/2018 Carotid U/S: L-carotid bifurcation 80-90% stenosed, R- carotid bifurcation 60% stenosed.  . Tobacco abuse     Medications:  Medications Prior to Admission  Medication Sig Dispense Refill Last Dose  . atorvastatin (LIPITOR) 80 MG tablet Take 80 mg by mouth daily.   Past Week at Unknown time  . budesonide-formoterol (SYMBICORT) 160-4.5 MCG/ACT inhaler Inhale 2 puffs into the lungs 2 (two) times daily.   Past Week at Unknown time  . carvedilol (COREG) 6.25 MG tablet Take 1 tablet (6.25 mg total) by mouth 2 (two) times daily with a meal. 60 tablet 1 Past Week at Unknown time  . magnesium oxide (MAG-OX) 400 MG tablet Take 400 mg by mouth 2 (two) times daily.   Past Week at Unknown time  . methocarbamol (ROBAXIN) 500 MG tablet Take 500 mg by mouth 4 (four) times daily.   Past Week at Unknown time  . nitroGLYCERIN (NITROSTAT) 0.4 MG SL tablet Place 0.4 mg under the tongue every 5 (five) minutes as needed for chest pain.   Unknown at PRN  . oxyCODONE (OXY IR/ROXICODONE) 5 MG immediate release tablet Take 5 mg by mouth 2 (two) times a day.   Past Week at Unknown time  . rivaroxaban (XARELTO) 20 MG TABS tablet Take 20 mg by mouth daily with  supper.   Past Week at Unknown time  . sennosides-docusate sodium (SENOKOT-S) 8.6-50 MG tablet Take 2 tablets by mouth daily.   Past Week at Unknown time  . spironolactone (ALDACTONE) 25 MG tablet Take 1 tablet (25 mg total) by mouth daily. 90 tablet 3 Past Week at Unknown time  . Tiotropium Bromide Monohydrate 2.5 MCG/ACT AERS Inhale 2 puffs into the lungs daily.   Past Week at Unknown time  . traZODone (DESYREL) 50 MG tablet Take 50 mg by mouth at bedtime.   Past Week at Unknown time  . docusate sodium (COLACE) 100 MG capsule Take 1 capsule (100 mg total) by mouth 2 (two) times daily. (Patient not taking: Reported on 04/05/2019) 60 capsule 6 Not Taking at Unknown time  . rivaroxaban (XARELTO) 15 MG TABS tablet Take 1 tablet (15 mg total) by mouth daily with supper. (Patient not taking: Reported on 04/05/2019) 30 tablet 1 Not Taking at Unknown time   Scheduled:  . atorvastatin  80 mg Oral Daily  . Chlorhexidine Gluconate Cloth  6 each Topical Q0600  . guaiFENesin  600 mg Oral BID  . insulin aspart  0-5 Units Subcutaneous QHS  . insulin aspart  0-9 Units Subcutaneous TID WC  . insulin glargine  10 Units Subcutaneous QHS  . magnesium oxide  400 mg Oral BID  . methocarbamol  500 mg Oral QID  . metoprolol tartrate  25 mg Oral BID  . polyethylene glycol  17 g Oral Daily  . senna-docusate  2 tablet Oral Daily  . tamsulosin  0.4 mg Oral Daily  . torsemide  40 mg Oral Daily   Infusions:  . sodium chloride Stopped (04/10/19 0742)  . heparin 650 Units/hr (04/13/19 0500)  . levETIRAcetam 250 mg (04/13/19 3875)   PRN: sodium chloride, bisacodyl, hydrALAZINE, nitroGLYCERIN, ondansetron (ZOFRAN) IV, ondansetron (ZOFRAN) IV, simethicone  Assessment: Pharmacy consulted to start heparin and hold rivaroxaban for possible procedure. Will use aPTT to monitor heparin as patient has been on rivaroxaban. Last dose of rivaroxaban 10/9 @1757 . H&H trending down, thrombocytopenic at baseline but appears to  be  stable  Goal of Therapy:  HL 0.3-0.7  Monitor platelets by anticoagulation protocol: Yes   Plan:    10/14 0746 HL 1.06, supratherapeutic  Hold heparin infusion one hour then decrease heparin drip to 450 units/hr  check heparin level 6 hrs after heparin resumed  CBC in am  Lowella Bandyodney D Hiilei Gerst, PharmD 04/13/2019,7:08 AM

## 2019-04-13 NOTE — Progress Notes (Signed)
Pt continued to remove BiPAP throughout the night.  He would complain that "I can't breathe."  I replaced it each time.  He oxygen sats would typically be in the mid 80s.

## 2019-04-14 DIAGNOSIS — N1832 Chronic kidney disease, stage 3b: Secondary | ICD-10-CM

## 2019-04-14 DIAGNOSIS — I63133 Cerebral infarction due to embolism of bilateral carotid arteries: Secondary | ICD-10-CM

## 2019-04-14 DIAGNOSIS — E1121 Type 2 diabetes mellitus with diabetic nephropathy: Secondary | ICD-10-CM

## 2019-04-14 DIAGNOSIS — J432 Centrilobular emphysema: Secondary | ICD-10-CM

## 2019-04-14 LAB — BASIC METABOLIC PANEL
Anion gap: 14 (ref 5–15)
BUN: 43 mg/dL — ABNORMAL HIGH (ref 8–23)
CO2: 24 mmol/L (ref 22–32)
Calcium: 9.5 mg/dL (ref 8.9–10.3)
Chloride: 108 mmol/L (ref 98–111)
Creatinine, Ser: 2.05 mg/dL — ABNORMAL HIGH (ref 0.61–1.24)
GFR calc Af Amer: 39 mL/min — ABNORMAL LOW (ref >60)
GFR calc non Af Amer: 34 mL/min — ABNORMAL LOW (ref >60)
Glucose, Bld: 173 mg/dL — ABNORMAL HIGH (ref 70–99)
Potassium: 4.3 mmol/L (ref 3.5–5.1)
Sodium: 146 mmol/L — ABNORMAL HIGH (ref 135–145)

## 2019-04-14 LAB — CBC
HCT: 38.6 % — ABNORMAL LOW (ref 39.0–52.0)
Hemoglobin: 11.7 g/dL — ABNORMAL LOW (ref 13.0–17.0)
MCH: 29.1 pg (ref 26.0–34.0)
MCHC: 30.3 g/dL (ref 30.0–36.0)
MCV: 96 fL (ref 80.0–100.0)
Platelets: 164 10*3/uL (ref 150–400)
RBC: 4.02 MIL/uL — ABNORMAL LOW (ref 4.22–5.81)
RDW: 22.6 % — ABNORMAL HIGH (ref 11.5–15.5)
WBC: 5.6 10*3/uL (ref 4.0–10.5)
nRBC: 2.1 % — ABNORMAL HIGH (ref 0.0–0.2)

## 2019-04-14 LAB — GLUCOSE, CAPILLARY
Glucose-Capillary: 108 mg/dL — ABNORMAL HIGH (ref 70–99)
Glucose-Capillary: 125 mg/dL — ABNORMAL HIGH (ref 70–99)
Glucose-Capillary: 132 mg/dL — ABNORMAL HIGH (ref 70–99)
Glucose-Capillary: 174 mg/dL — ABNORMAL HIGH (ref 70–99)
Glucose-Capillary: 181 mg/dL — ABNORMAL HIGH (ref 70–99)

## 2019-04-14 LAB — HEPARIN LEVEL (UNFRACTIONATED)
Heparin Unfractionated: 0.49 IU/mL (ref 0.30–0.70)
Heparin Unfractionated: 0.42 IU/mL (ref 0.30–0.70)

## 2019-04-14 LAB — MAGNESIUM: Magnesium: 2.9 mg/dL — ABNORMAL HIGH (ref 1.7–2.4)

## 2019-04-14 MED ORDER — FUROSEMIDE 10 MG/ML IJ SOLN
60.0000 mg | Freq: Once | INTRAMUSCULAR | Status: AC
  Administered 2019-04-14: 60 mg via INTRAVENOUS
  Filled 2019-04-14: qty 6

## 2019-04-14 MED ORDER — METOPROLOL TARTRATE 50 MG PO TABS
50.0000 mg | ORAL_TABLET | Freq: Two times a day (BID) | ORAL | Status: DC
  Administered 2019-04-14 – 2019-04-16 (×3): 50 mg via ORAL
  Filled 2019-04-14 (×4): qty 1

## 2019-04-14 MED ORDER — METOPROLOL TARTRATE 25 MG PO TABS
25.0000 mg | ORAL_TABLET | Freq: Once | ORAL | Status: AC
  Administered 2019-04-14: 25 mg via ORAL
  Filled 2019-04-14: qty 1

## 2019-04-14 MED ORDER — FUROSEMIDE 10 MG/ML IJ SOLN
60.0000 mg | Freq: Two times a day (BID) | INTRAMUSCULAR | Status: DC
  Administered 2019-04-14 – 2019-04-16 (×4): 60 mg via INTRAVENOUS
  Filled 2019-04-14 (×4): qty 6

## 2019-04-14 NOTE — Consult Note (Signed)
ANTICOAGULATION CONSULT NOTE   Pharmacy Consult for Heparin Indication: atrial fibrillation  Patient Measurements: Height: 5\' 11"  (180.3 cm) Weight: 220 lb 0.3 oz (99.8 kg) IBW/kg (Calculated) : 75.3 Heparin Dosing Weight: 95.8 kg  Vital Signs: Temp: 97.8 F (36.6 C) (10/15 0200) Temp Source: Axillary (10/15 0200) BP: 153/98 (10/15 0600) Pulse Rate: 115 (10/15 0600)  Labs: Recent Labs    04/11/19 1906  04/12/19 0309 04/12/19 1221  04/13/19 0554 04/13/19 0746 04/13/19 1938 04/14/19 0326  HGB  --    < > 11.4*  --   --  11.2*  --   --  11.7*  HCT  --   --  37.0*  --   --  37.2*  --   --  38.6*  PLT  --   --  130*  --   --  147*  --   --  164  APTT 119*  --  >160* >160*  --   --   --   --   --   HEPARINUNFRC  --   --  3.24*  --    < >  --  1.06* 0.79* 0.49  CREATININE  --   --  1.97*  --   --  2.09*  --   --  2.05*   < > = values in this interval not displayed.    Estimated Creatinine Clearance: 45 mL/min (A) (by C-G formula based on SCr of 2.05 mg/dL (H)).  Medical History: Past Medical History:  Diagnosis Date  . CAD in native artery    a. stress echo 12/2007 abnl, EF > 55%, b. LHC 01/28/08: mLAD 30, D1 40, dLCx 72, pRCA 30, mRCA 70, mRCA lesion 2 80, PDA 90, s/p PCI/BMS to prox and distal RCA, s/p PCI/BMS to PDA; c. patient reports PCI/stenting x 2 in early 2017 at the [place] (no records on file) d. 06/2016: cath showing patent stents along RCA and LCx with moderate 40% stenosis along the LAD.   [name] Carotid arterial disease ([place])    a. 05/2018 CTA Head/neck: 80-90% stenosis @ L carotid bifurcation, extending into the prox LICA. 60% stenosis @ R carotid bifurcation. 50-75% bilateral distal cavernous carotid dzs.  . 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 2009 Echo: > 55%; b. 06/2016: EF 25-30%; c. 12/2017 Echo: EF 30-35%, diff HK; d. 05/2018 Echo: 30-35%, mild conc LVH, diff HK. Mild MR. Mildly dil LA/RA.  [name] Chronic kidney  disease   . COPD (chronic obstructive pulmonary disease) ([place])   . Diabetes mellitus without complication ([place])   . Gout   . Hyperlipidemia   . Hypertension   . Hypertensive heart disease   . Ischemic cardiomyopathy    a. 05/2018: 30-35%.  . Obesity   . Persistent atrial fibrillation ([place]) 01/2017   a. cardioverted 9/18 to NSR; b. 12/2017 noted to be back in Afib-outpt dccv rec; c. CHA2DS2VASc = 6-->Xarelto.  . Stroke (cerebrum) ([place])    a. 05/2018 MRI: small patchy acute to subacute cortial and white matter infarct in the bilat cerebrum. Mild chronic small vessel ischmia; b. 05/2018 Carotid U/S: L-carotid bifurcation 80-90% stenosed, R- carotid bifurcation 60% stenosed.  . Tobacco abuse     Medications:  Medications Prior to Admission  Medication Sig Dispense Refill Last Dose  . atorvastatin (LIPITOR) 80 MG tablet Take 80 mg by mouth daily.   Past Week at Unknown time  . budesonide-formoterol (SYMBICORT) 160-4.5 MCG/ACT inhaler Inhale 2 puffs  into the lungs 2 (two) times daily.   Past Week at Unknown time  . carvedilol (COREG) 6.25 MG tablet Take 1 tablet (6.25 mg total) by mouth 2 (two) times daily with a meal. 60 tablet 1 Past Week at Unknown time  . magnesium oxide (MAG-OX) 400 MG tablet Take 400 mg by mouth 2 (two) times daily.   Past Week at Unknown time  . methocarbamol (ROBAXIN) 500 MG tablet Take 500 mg by mouth 4 (four) times daily.   Past Week at Unknown time  . nitroGLYCERIN (NITROSTAT) 0.4 MG SL tablet Place 0.4 mg under the tongue every 5 (five) minutes as needed for chest pain.   Unknown at PRN  . oxyCODONE (OXY IR/ROXICODONE) 5 MG immediate release tablet Take 5 mg by mouth 2 (two) times a day.   Past Week at Unknown time  . rivaroxaban (XARELTO) 20 MG TABS tablet Take 20 mg by mouth daily with supper.   Past Week at Unknown time  . sennosides-docusate sodium (SENOKOT-S) 8.6-50 MG tablet Take 2 tablets by mouth daily.   Past Week at Unknown time  . spironolactone  (ALDACTONE) 25 MG tablet Take 1 tablet (25 mg total) by mouth daily. 90 tablet 3 Past Week at Unknown time  . Tiotropium Bromide Monohydrate 2.5 MCG/ACT AERS Inhale 2 puffs into the lungs daily.   Past Week at Unknown time  . traZODone (DESYREL) 50 MG tablet Take 50 mg by mouth at bedtime.   Past Week at Unknown time  . docusate sodium (COLACE) 100 MG capsule Take 1 capsule (100 mg total) by mouth 2 (two) times daily. (Patient not taking: Reported on 04/05/2019) 60 capsule 6 Not Taking at Unknown time  . rivaroxaban (XARELTO) 15 MG TABS tablet Take 1 tablet (15 mg total) by mouth daily with supper. (Patient not taking: Reported on 04/05/2019) 30 tablet 1 Not Taking at Unknown time   Scheduled:  . atorvastatin  80 mg Oral Daily  . Chlorhexidine Gluconate Cloth  6 each Topical Q0600  . guaiFENesin  600 mg Oral BID  . insulin aspart  0-5 Units Subcutaneous QHS  . insulin aspart  0-9 Units Subcutaneous TID WC  . insulin glargine  10 Units Subcutaneous QHS  . magnesium oxide  400 mg Oral BID  . methocarbamol  500 mg Oral QID  . metoprolol tartrate  25 mg Oral BID  . polyethylene glycol  17 g Oral Daily  . senna-docusate  2 tablet Oral Daily  . tamsulosin  0.4 mg Oral Daily  . torsemide  40 mg Oral Daily   Infusions:  . sodium chloride Stopped (04/10/19 0742)  . heparin 400 Units/hr (04/14/19 0805)  . levETIRAcetam 250 mg (04/14/19 1660)   PRN: sodium chloride, bisacodyl, hydrALAZINE, nitroGLYCERIN, ondansetron (ZOFRAN) IV, ondansetron (ZOFRAN) IV, simethicone  Assessment: Pharmacy consulted to start heparin and hold rivaroxaban for possible procedure. Will use aPTT to monitor heparin as patient has been on rivaroxaban. Last dose of rivaroxaban 10/9 @1757 . H&H trending down, thrombocytopenic at baseline but actually has improved since admission. The plan is for a carotid endarterectomy once the patient is more stable  Goal of Therapy:  HL 0.3-0.7  Monitor platelets by anticoagulation  protocol: Yes   Plan:   0926 HL 0.42: this is the second consecutive therapeutic heparin level  Continue heparin infusion at 400 units/hr  Next heparin level with morning labs tomorrow  F/U CBC in am  , PharmD 04/14/2019,8:10 AM

## 2019-04-14 NOTE — Progress Notes (Signed)
Name: Anhad Sheeley MRN: [id number] DOB: 26-Jun-1956     CONSULTATION DATE: 04/13/2019  CHIEF COMPLAINT:  Acute respiratory failure  SIGNIFICANT EVENTS/STUDIES: 10/5Admitted to the hospital for right sided weakness, with left frontal infarct on CT and acute CHF exacerbation 10/6Echocardiogram demonstrated chronic systolic heart failure with EF 30-35% 10/8MRI demonstrated multiple acute infarcts within the left cerebral hemisphere involving the cortex. 10/11Transferred to ICU overnight because he had increased work of breathing and became somnolent 10/13 - mentation improved, plan for OR in am  10/14 - diuresed well overnight, noncompliant with therapy and BIPAP 10/15 - patient still altered, discussed with cardiology today, overall poor prognosis  HISTORY OF PRESENT ILLNESS:   Keiston Manley is a 63 yo male with history of CAD, known afib/flutter and cardiomyopathy with a prior stroke who presented to the ED complaining of weakness and was found to have an acute CVA with right upper extremity weakness. MRI demonstrated multiple acute infarcts within the left cerebral hemisphere. CT of neck showed soft calcified plaque near occlusion of left carotid which was confirmed by angiography. Anticipate surgical endarterectomy this week per vascular. Pt was transferred to ICU overnight 10/11 for increased work of breathing and somnolence.  Today, pt is alert and oriented but noncompliant with BiPAP. No definitive plans for endarterectomy.  PAST MEDICAL HISTORY :   has a past medical history of CAD in native artery, Carotid arterial disease ([place]), Chronic combined systolic (congestive) and diastolic (congestive) heart failure ([place]), Chronic kidney disease, COPD (chronic obstructive pulmonary disease) ([place]), Diabetes mellitus without complication ([place]), Gout, Hyperlipidemia, Hypertension, Hypertensive heart disease, Ischemic cardiomyopathy, Obesity, Persistent atrial fibrillation ([place]) (01/2017),  Stroke (cerebrum) ([place]), and Tobacco abuse.  has a past surgical history that includes Cardiac catheterization (2009); Cardiac catheterization (2010); Coronary angioplasty (2009); Cardiac catheterization (N/A, 07/28/2016); CARDIOVERSION (N/A, 03/27/2017); CARDIOVERSION (N/A, 07/30/2018); and CAROTID ANGIOGRAPHY (Bilateral, 04/08/2019). Prior to Admission medications   Medication Sig Start Date End Date Taking? Authorizing Provider  atorvastatin (LIPITOR) 80 MG tablet Take 80 mg by mouth daily.   Yes [provider]  budesonide-formoterol (SYMBICORT) 160-4.5 MCG/ACT inhaler Inhale 2 puffs into the lungs 2 (two) times daily.   Yes [provider]  carvedilol (COREG) 6.25 MG tablet Take 1 tablet (6.25 mg total) by mouth 2 (two) times daily with a meal. 07/31/18  Yes [name], MD  magnesium oxide (MAG-OX) 400 MG tablet Take 400 mg by mouth 2 (two) times daily.   Yes [provider]  methocarbamol (ROBAXIN) 500 MG tablet Take 500 mg by mouth 4 (four) times daily.   Yes [provider]  nitroGLYCERIN (NITROSTAT) 0.4 MG SL tablet Place 0.4 mg under the tongue every 5 (five) minutes as needed for chest pain.   Yes [provider]  oxyCODONE (OXY IR/ROXICODONE) 5 MG immediate release tablet Take 5 mg by mouth 2 (two) times a day. 01/14/19  Yes [provider]  rivaroxaban (XARELTO) 20 MG TABS tablet Take 20 mg by mouth daily with supper.   Yes [provider]  sennosides-docusate sodium (SENOKOT-S) 8.6-50 MG tablet Take 2 tablets by mouth daily.   Yes [provider]  spironolactone (ALDACTONE) 25 MG tablet Take 1 tablet (25 mg total) by mouth daily. 09/09/18  Yes Dunn, Areta Haber, PA-C  Tiotropium Bromide Monohydrate 2.5 MCG/ACT AERS Inhale 2 puffs into the lungs daily.   Yes [provider]  traZODone (DESYREL) 50 MG tablet Take 50 mg by mouth at bedtime.   Yes [provider]  docusate sodium (COLACE) 100 MG capsule  Take 1 capsule (100 mg total) by mouth 2 (two) times daily. Patient not taking: Reported on 04/05/2019 09/02/18   Delma Freeze, FNP  rivaroxaban (XARELTO) 15 MG TABS tablet Take 1 tablet (15 mg total) by mouth daily with supper. Patient not taking: Reported on 04/05/2019 08/20/18   Enedina Finner, MD   Allergies  Allergen Reactions  . Aspirin Other (See Comments)    Told not to take because of blood thinner  . Entresto [Sacubitril-Valsartan] Other (See Comments)    Dizziness, weakness and stuttering  . Ibuprofen Nausea And Vomiting  . Tylenol [Acetaminophen] Nausea And Vomiting   Review of Systems:  Patient is altered unable to obtain ROS  VITAL SIGNS: Temp:  [97.7 F (36.5 C)-98.6 F (37 C)] 97.7 F (36.5 C) (10/15 0802) Pulse Rate:  [112-121] 118 (10/15 1000) Resp:  [14-29] 25 (10/15 1000) BP: (120-154)/(81-115) 125/81 (10/15 1000) SpO2:  [81 %-100 %] 95 % (10/15 1000)   I/O last 3 completed shifts: In: 908.4 [P.O.:360; I.V.:239.4; IV Piggyback:309] Out: 2050 [Urine:2050] Total I/O In: 391.3 [P.O.:120; I.V.:65.3; IV Piggyback:206] Out: -    SpO2: 95 % O2 Flow Rate (L/min): 2 L/min   Physical Examination:  GENERAL:critically ill appearing, with increased respiratory rate HEAD: Normocephalic, atraumatic.  EYES: Pupils equal, round, reactive to light.  No scleral icterus.  MOUTH: Moist mucosal membrane. NECK: Supple. No JVD.  PULMONARY: mild pan-inspiratory crackles CARDIOVASCULAR: S1 and S2. Tachycardic. No murmurs, rubs, or gallops.  GASTROINTESTINAL: Soft, nontender, -distended. No masses. Positive bowel sounds. No hepatosplenomegaly.  MUSCULOSKELETAL: No swelling, clubbing, or edema.  NEUROLOGIC: A&Ox3 SKIN:intact,warm,dry  I personally reviewed lab work that was obtained in last 24 hrs. CXR Independently reviewed  MEDICATIONS: I have reviewed all medications and confirmed regimen as documented   CULTURE RESULTS   Recent Results (from the past 240 hour(s))   Urine culture     Status: None   Collection Time: 04/04/19  7:37 PM   Specimen: Urine, Random  Result Value Ref Range Status   Specimen Description   Final    URINE, RANDOM Performed at [facility], [address] [place], [place] [address]    Special Requests   Final    NONE Performed at [facility], [address], [place], [place] [address]    Culture   Final    NO GROWTH Performed at [facility], [address], [place], [place] [address]    Report Status 04/05/2019 FINAL  Final  SARS CORONAVIRUS 2 (TAT 6-24 HRS) Nasopharyngeal Nasopharyngeal Swab     Status: None   Collection Time: 04/04/19  9:21 PM   Specimen: Nasopharyngeal Swab  Result Value Ref Range Status   SARS Coronavirus 2 NEGATIVE NEGATIVE Final    Comment: (NOTE) SARS-CoV-2 target nucleic acids are NOT DETECTED. The SARS-CoV-2 RNA is generally detectable in upper and lower respiratory specimens during the acute phase of infection. Negative results do not preclude SARS-CoV-2 infection, do not rule out co-infections with other pathogens, and should not be used as the sole basis for treatment or other patient management decisions. Negative results must be combined with clinical observations, patient history, and epidemiological information. The expected result is Negative. Fact Sheet for Patients: HairSlick.no Fact Sheet for Healthcare Providers: quierodirigir.com This test is not yet approved or cleared by the [place] FDA and  has been authorized for detection and/or diagnosis of SARS-CoV-2 by FDA under an Emergency Use Authorization (EUA). This  EUA will remain  in effect (meaning this test can be used) for the duration of the COVID-19 declaration under Section 56 4(b)(1) of the Act, 21 U.S.C. section 360bbb-3(b)(1), unless the authorization is terminated or revoked sooner. Performed at [facility], [address] [place], [place] [address]   MRSA PCR Screening     Status: None   Collection Time: 04/10/19  3:39 PM   Specimen: Nasal Mucosa; Nasopharyngeal  Result Value Ref Range Status   MRSA by PCR NEGATIVE NEGATIVE Final    Comment:        The GeneXpert MRSA Assay (FDA approved for NASAL specimens only), is one component of a comprehensive MRSA colonization surveillance program. It is not intended to diagnose MRSA infection nor to guide or monitor treatment for MRSA infections. Performed at [facility], [address] [place], [place] [address]           IMAGING    No results found.    ASSESSMENT AND PLAN SYNOPSIS 63 yo male admitted to the ICU with acute hypoxic respiratory failure and lethargy secondary to acute CHF exacerbation and CVA with multiple acute infarcts of left hemisphere and severe stenosis (99%) of left internal carotid artery  ACUTE Hypoxic and Hypercapnic Respiratorysecondary toacute on chronicCHF exacerbation BIPAP is mandatory Oxygen as needed High risk for intubation - US LE due to report of sever dyspnea to r/o DVT/PE despite anticoag. Is negative -continue diuresis, patient is noncompliant with BIPAP - restrict fluid to <1200cc/24h  Acute CVA Severe stenosis (99%) of left internal carotid artery - vascular surgery consulted - plan is to schedule endarterectomy -no definitive plan currently   Acute on chronic decompensated systolic cardiac failure - EF 09-81%30-35% -oxygen as needed -Diuresis with lasix bid -restrict fluids to 1200cc/24h -CXR - shows pulmonary edema   Aucte on chronic renal failure stage 3 -follow chem 7 -follow UO -Avoid nephrotoxic agents  NEUROLOGY -acute CVA with multiple infarcts of left hemisphere and severe stenosis of left internal carotid artery (99%) - follow with neurology recs   CARDIAC ICU monitoring  GI GI PROPHYLAXIS as indicated  NUTRITIONAL STATUS DIET-->TF's as tolerated Constipation  protocol as indicated  ENDO - will use ICU hypoglycemic\Hyperglycemia protocol if needed  ELECTROLYTES -follow labs as needed -replace as needed -pharmacy consultation and following  DVT/GI PRX ordered TRANSFUSIONS AS NEEDED MONITOR FSBS ASSESS the need for LABS       Vida RiggerFuad Treysean Petruzzi, M.D.  Pulmonary &Critical Care Medicine  [facility]  Critical care provider statement:  Critical care time (minutes):32 Critical care time was exclusive of: Separately billable procedures and  treating other patients Critical care was necessary to treat or prevent imminent or  life-threatening deterioration of the following conditions: Acute hypoxemic respiratory failure, acute decompensated systolic CHF, acute CVA, multiple chronic comorbid conditions Critical care was time spent personally by me on the following  activities: Development of treatment plan with patient or surrogate,  discussions with consultants, evaluation of patient's response to  treatment, examination of patient, obtaining history from patient or  surrogate, ordering and performing treatments and interventions, ordering  and review of laboratory studies and re-evaluation of patient's condition I assumed direction of critical care for this patient from another  provider in my specialty: no

## 2019-04-14 NOTE — Progress Notes (Signed)
[facility] NAME: [name]    MR#:  [id number]  DATE OF BIRTH:  01-15-1956  SUBJECTIVE:  No acute events patient looks ill and not much movement right side  REVIEW OF SYSTEMS:    Unable to obtain from CVA  DRUG ALLERGIES:   Allergies  Allergen Reactions   Aspirin Other (See Comments)    Told not to take because of blood thinner   Entresto [Sacubitril-Valsartan] Other (See Comments)    Dizziness, weakness and stuttering   Ibuprofen Nausea And Vomiting   Tylenol [Acetaminophen] Nausea And Vomiting    VITALS:  Blood pressure 125/81, pulse (!) 118, temperature 97.7 F (36.5 C), temperature source Oral, resp. rate (!) 25, height 5\' 11"  (1.803 m), weight 99.8 kg, SpO2 95 %.  PHYSICAL EXAMINATION:   Physical Exam  GENERAL:  63 y.o.-year-old patient lying in bed  Cheyne stokes breathing  EYES: Pupils equal, round, reactive to light and accommodation. No scleral icterus. Extraocular muscles intact.  HEENT: Head atraumatic, normocephalic. Oropharynx and nasopharynx clear.  NECK:  Supple, no jugular venous distention. No thyroid enlargement, no tenderness.  LUNGS: b/l rhonchii  CARDIOVASCULAR: IRR, IRR. tachyNo murmurs, rubs, or gallops.  ABDOMEN: Soft, nontender, nondistended. Bowel sounds present. No organomegaly or mass.  EXTREMITIES: No cyanosis, clubbing or edema b/l.    NEUROLOGIC: Patient with slight facial droop and right upper extremity weakness 1 out of 5 right lower extremity 2 out of 5  PSYCHIATRIC: The patient is alert however only says yes at this time.  SKIN: No obvious rash, lesion, or ulcer.    LABORATORY PANEL:   CBC Recent Labs  Lab 04/14/19 0326  WBC 5.6  HGB 11.7*  HCT 38.6*  PLT 164   ------------------------------------------------------------------------------------------------------------------  Chemistries  Recent Labs  Lab 04/14/19 0326  NA 146*  K 4.3  CL 108  CO2 24    GLUCOSE 173*  BUN 43*  CREATININE 2.05*  CALCIUM 9.5  MG 2.9*   ------------------------------------------------------------------------------------------------------------------  Cardiac Enzymes No results for input(s): TROPONINI in the last 168 hours. ------------------------------------------------------------------------------------------------------------------  RADIOLOGY:  US Venous Img Lower Bilateral  Result Date: 04/13/2019 CLINICAL DATA:  Leg swelling.  Shortness of breath. EXAM: BILATERAL LOWER EXTREMITY VENOUS DOPPLER ULTRASOUND TECHNIQUE: Gray-scale sonography with graded compression, as well as color Doppler and duplex ultrasound were performed to evaluate the lower extremity deep venous systems from the level of the common femoral vein and including the common femoral, femoral, profunda femoral, popliteal and calf veins including the posterior tibial, peroneal and gastrocnemius veins when visible. The superficial great saphenous vein was also interrogated. Spectral Doppler was utilized to evaluate flow at rest and with distal augmentation maneuvers in the common femoral, femoral and popliteal veins. COMPARISON:  None. FINDINGS: RIGHT LOWER EXTREMITY Common Femoral Vein: No evidence of thrombus. Normal compressibility, respiratory phasicity and response to augmentation. Saphenofemoral Junction: No evidence of thrombus. Normal compressibility and flow on color Doppler imaging. Profunda Femoral Vein: No evidence of thrombus. Normal compressibility and flow on color Doppler imaging. Femoral Vein: No evidence of thrombus. Normal compressibility, respiratory phasicity and response to augmentation. Popliteal Vein: No evidence of thrombus. Normal compressibility, respiratory phasicity and response to augmentation. Calf Veins: No evidence of thrombus. Normal compressibility and flow on color Doppler imaging. Other Findings:  None. LEFT LOWER EXTREMITY Common Femoral Vein: No evidence of  thrombus. Normal compressibility, respiratory phasicity and response to augmentation. Saphenofemoral Junction: No evidence of thrombus. Normal compressibility and  flow on color Doppler imaging. Profunda Femoral Vein: No evidence of thrombus. Normal compressibility and flow on color Doppler imaging. Femoral Vein: No evidence of thrombus. Normal compressibility, respiratory phasicity and response to augmentation. Popliteal Vein: No evidence of thrombus. Normal compressibility, respiratory phasicity and response to augmentation. Calf Veins: No evidence of thrombus. Normal compressibility and flow on color Doppler imaging. Other Findings:  None. IMPRESSION: No evidence of deep venous thrombosis in either lower extremity. Electronically Signed   By: Richarda Overlie M.D.   On: 04/13/2019 11:51   Dg Chest Port 1 View  Result Date: 04/13/2019 CLINICAL DATA:  62 year old male with COPD. EXAM: PORTABLE CHEST 1 VIEW COMPARISON:  Portable chest 04/11/2019 and earlier. FINDINGS: Portable AP semi upright view at 0253 hours. Stable lung volumes and cardiomegaly. Visualized tracheal air column is within normal limits. Pulmonary vascularity appears stable recently, improved since 04/09/2019. No pneumothorax, pleural effusion or confluent pulmonary opacity. No areas of worsening ventilation. No acute osseous abnormality identified. IMPRESSION: Stable ventilation since 04/11/2019. No new cardiopulmonary abnormality. Electronically Signed   By: Odessa Fleming M.D.   On: 04/13/2019 03:54     ASSESSMENT AND PLAN:   63 year old male with past medical history of persistent atrial fibrillation, ischemic cardiomyopathy, hypertension, hyperlipidemia, history of gout, diabetes, COPD, chronic combined systolic diastolic CHF, carotid artery disease who presented to the hospital due to right upper extremity weakness and noted to have an acute CVA.  1.   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   As per neurology continue Xarelto.  (Currently on heparin drip) CTA of the neck recently showing Severe soft and calcified plaque within the left carotid bifurcation and proximal ICA. Near complete occlusion versus complete occlusion with immediate reconstitution within the proximal left ICA. Given subtotal occlusion of left carotid bulb patient will need surgical endarterectomy Continue statin  2.  Carotid artery stenosis:  Plan is for carotid endarterectomy once more stable Patient has been evaluated for preop clearance by cardiology.  Patient at moderate risk for moderate risk procedure.  No further testing or intervention and reduce this risk.   3. AMS/Encephalopathy ; improved  Patient had an episode of confusion on 04/08/2019.  Was reevaluated by neurology.  Neurology thinks this could possibly be seizures.  Repeat CT head done reviewed expected CT appearance of the scattered small left hemisphere infarcts seen by MRI previously.  Patient started on Keppra.  Neurology following.  4.  Persistent atrial flutter/flutter: Heparin in place of Xarelto Patient will resume DOAC prior to admission Continue Lopressor  5.  Acute hypoxic/hypercapnic respiratory failure due to acute on chronic systolic CHF exacerbation, EF 30 to 35% with LVH.  Continue torsemide Continue Lopressor Not currently on ACE inhibitor/ARB/Entresto in setting of permissive hypertension for CVA and acute kidney injury on chronic kidney disease Patient needs BiPAP High Risk for intubation  6. Hyperlipidemia - cont. Atorvastatin  7.  COPD without signs of exacerbation   8.  Diabetes type 2 with hyperglycemia- patient's A1c was as high as 8. Continue sliding scale  9.  BPH: Continue Flomax - Continue sliding scale insulin, low dose Lantus.  BS stable.    - await DM Coordinator input.    10.  Acute on chronic kidney disease stage II: Creatinine improving    11.Cheyne-Stokes respiration: Patient transferred to ICU October 11.  DVT prophylaxis; patient now on heparin drip Consider speech evaluation and palliative care consult  Plan of care discussed with nursingt  CODE STATUS: Full code  TOTAL TIME TAKING CARE OF THIS PATIENT: 25 minutes.   POSSIBLE D/C IN 4 DAYS, DEPENDING ON CLINICAL CONDITION.   Adrian SaranSital Pastor Sgro M.D on 04/14/2019 at 11:00 AM  Between 7am to 6pm - Pager - [phone]  After 6pm go to [email or web address] - Therapist, nutritionalpassword [email or web address] [email or web address]  [facility]  Office  [phone]  CC: Primary care physician; [facility]

## 2019-04-14 NOTE — Progress Notes (Signed)
PT Cancellation Note  Patient Details Name: Monti Jilek MRN: [id number] DOB: 1956/05/03   Cancelled Treatment:    Reason Eval/Treat Not Completed: Medical issues which prohibited therapy.  Pt currently on BiPAP with high risk for intubation per MD note.  Will hold PT evaluation at this time and will attempt to see pt at a future date/time as medically appropriate.     Linus Salmons PT, DPT 04/14/19, 2:35 PM

## 2019-04-14 NOTE — Progress Notes (Signed)
Palliative: Mr. Radziewicz is lying quietly in bed.  He has periods of apnea.  He will spontaneously breath without prompting and maintains sats in upper 90's. He is able to briefly make but not keep eye contact and appears acutely ill.  There is no family at bedside at this time.   Discuss situation with bedside nursing staff.   Call to wife [name] at [phone].  We talked about the decline in [name]'s had over the last few days.  [name] states that she has noticed that he is weaker, sicker.  She states that yesterday he told me, "he is tired" she also stated that he told her that, "he is ready to go".  We talked about continuing to treat the treatable, but no CPR no intubation.  [name] quickly and readily agrees stating that she would not want to put [name] through any of that.  We talked about the treatment plan in detail, reassuring [name] that we are still treating [name].  I share my concern about his continued frailty and decline, in particular with his statements that he is tired and ready to go.  [name] shares, "I see him going down".    We talked about his instability, unable to have surgical procedure at this time.  [name] states that they are still agreeable to surgery if offered, goal continues to be improvement, return to function as possible.  Plan:   Change CODE STATUS to DNR, okay for BiPAP as tolerated.  Continues to be agreeable to carotid endarterectomy if offered.  66 minutes Quinn Axe, NP Palliative Medicine Team Team Phone # [phone] Greater than 50% of this time was spent counseling and coordinating care related to the above assessment and plan.

## 2019-04-14 NOTE — Progress Notes (Addendum)
Progress Note  Patient Name: James CluckSamuel Friesenhahn Date of Encounter: 04/14/2019  Primary Cardiologist: [facility]  Subjective   Sleepy this morning. Cheyenne Stokes respirations persist. SCr slightly improved. Potassium at goal. Remains in atrial flutter with RVR with rates in the 110s bpm. HGB low, though stable.   Inpatient Medications    Scheduled Meds: . atorvastatin  80 mg Oral Daily  . Chlorhexidine Gluconate Cloth  6 each Topical Q0600  . guaiFENesin  600 mg Oral BID  . insulin aspart  0-5 Units Subcutaneous QHS  . insulin aspart  0-9 Units Subcutaneous TID WC  . insulin glargine  10 Units Subcutaneous QHS  . magnesium oxide  400 mg Oral BID  . methocarbamol  500 mg Oral QID  . metoprolol tartrate  25 mg Oral BID  . polyethylene glycol  17 g Oral Daily  . senna-docusate  2 tablet Oral Daily  . tamsulosin  0.4 mg Oral Daily  . torsemide  40 mg Oral Daily   Continuous Infusions: . sodium chloride Stopped (04/10/19 0742)  . heparin 400 Units/hr (04/14/19 0600)  . levETIRAcetam 250 mg (04/14/19 16100635)   PRN Meds: sodium chloride, bisacodyl, hydrALAZINE, nitroGLYCERIN, ondansetron (ZOFRAN) IV, ondansetron (ZOFRAN) IV, simethicone   Vital Signs    Vitals:   04/14/19 0300 04/14/19 0400 04/14/19 0500 04/14/19 0600  BP: (!) 146/96 (!) 142/105 (!) 145/111 (!) 153/98  Pulse: (!) 116 (!) 116 (!) 117 (!) 115  Resp: (!) 25   (!) 23  Temp:      TempSrc:      SpO2: 95% 95% 100% (!) 86%  Weight:      Height:        Intake/Output Summary (Last 24 hours) at 04/14/2019 0724 Last data filed at 04/14/2019 0600 Gross per 24 hour  Intake 286.92 ml  Output 600 ml  Net -313.08 ml   Filed Weights   04/05/19 0618  Weight: 99.8 kg    Telemetry    Atrial flutter with RVR, 110s bpm - Personally Reviewed  ECG    No new tracings - Personally Reviewed  Physical Exam   GEN: No acute distress.   Neck: No JVD. Cardiac: Tachycardic, no murmurs, rubs, or gallops.   Respiratory: Clear to auscultation bilaterally.  GI: Soft, nontender, non-distended.   MS: No edema; No deformity. Neuro:  Alert; Nonfocal.  Psych: Confusion persists.  Labs    Chemistry Recent Labs  Lab 04/12/19 0309 04/13/19 0554 04/14/19 0326  NA 141 145 146*  K 3.9 3.9 4.3  CL 108 109 108  CO2 24 25 24   GLUCOSE 120* 127* 173*  BUN 45* 42* 43*  CREATININE 1.97* 2.09* 2.05*  CALCIUM 8.7* 9.0 9.5  GFRNONAA 35* 33* 34*  GFRAA 41* 38* 39*  ANIONGAP 9 11 14      Hematology Recent Labs  Lab 04/12/19 0309 04/13/19 0554 04/14/19 0326  WBC 6.4 6.8 5.6  RBC 3.91* 3.84* 4.02*  HGB 11.4* 11.2* 11.7*  HCT 37.0* 37.2* 38.6*  MCV 94.6 96.9 96.0  MCH 29.2 29.2 29.1  MCHC 30.8 30.1 30.3  RDW 22.4* 22.5* 22.6*  PLT 130* 147* 164    Cardiac EnzymesNo results for input(s): TROPONINI in the last 168 hours. No results for input(s): TROPIPOC in the last 168 hours.   BNP Recent Labs  Lab 04/10/19 0459  BNP 1,018.0*     DDimer No results for input(s): DDIMER in the last 168 hours.   Radiology    [place] Venous  Img Lower Bilateral  Result Date: 04/13/2019 IMPRESSION: No evidence of deep venous thrombosis in either lower extremity. Electronically Signed   By: Markus Daft M.D.   On: 04/13/2019 11:51   Dg Chest Port 1 View  Result Date: 04/13/2019 IMPRESSION: Stable ventilation since 04/11/2019. No new cardiopulmonary abnormality. Electronically Signed   By: Genevie Ann M.D.   On: 04/13/2019 03:54    Cardiac Studies   2D echo 04/05/2019: 1. Left ventricular ejection fraction, by visual estimation, is 30 to 35%. The left ventricle has moderately decreased function. Left ventricular septal wall thickness was moderately increased. Moderately increased left ventricular posterior wall thickness. There is moderately increased left ventricular hypertrophy. 2. Global right ventricle has normal systolic function.The right ventricular size is mildly enlarged. No increase in right  ventricular wall thickness. 3. Left atrial size was mild-moderately dilated. 4. Right atrial size was mildly dilated. 5. The mitral valve is degenerative. Mild mitral valve regurgitation. 6. The tricuspid valve is not well visualized. Tricuspid valve regurgitation is mild. 7. The aortic valve was not well visualized Aortic valve regurgitation is trivial by color flow Doppler. Mild aortic valve stenosis. 8. The pulmonic valve was not well visualized. Pulmonic valve regurgitation is trivial by color flow Doppler. 9. The aortic root was not well visualized. 10. The interatrial septum was not assessed.  Patient Profile     63 y.o. male with history of CAD status post prior stenting to the RCA andrPDA,HFrEF with an EF of 30 to 35% by echo in 05/2018 due to ICM, persistent A. fib/flutter on Xarelto,carotid artery disease status post recent stroke in12/2019 pending right sided CEA, CKD stage II,COPD secondary to tobacco abuse, diabetes, hypertension, hyperlipidemia, and obesityadmitted with acute CVA with noted near occlusion of the left ICA as noted on CTA neck.  Assessment & Plan    1. Perioperative cardiac risk evaluation: -He denies any symptoms concerning for angina -After discussion with MD, we will cancel stress test given [facility] Stokes respirations -He will be moderate risk for noncardiac procedure regardless, no further testing or intervention will reduce this risk  2. CAD involving the native coronary arteries: -No symptoms concerning for angina -Has been on Xarelto in place of ASA -Lipitor  3. Persistent Afib/flutter: -Ventricular rates aremildly tachycardic this morning following discontinuation of Cardizem given his cardiomyopathy -Heparin gtt in place of Xarelto while admitted, will resume DOAC prior to admission -Changed from Coreg to Lopressor for added rate control on 10/13, titrate is needed, with need for permissive HTN as well given recent CVA  4. CVA:  -Permissive hypertension -Neurology on board  -Pendingpossibleleft-sided CEA this admission -Lipitor  5. Carotid artery disease: -Ambiguous CTA neck/carotid artery ultrasound -Vascular surgery isconsideringCEA   6. HFrEF secondary to ICM: -May be slightly volume up -Stopped diltiazem on 10/12 given his CM -He does not appear grossly volume up -Slight improvement in SCr this morning  -On torsemide  -Transitioned from Coreg to Lopressor as above for added rate control -When able, transition to Toprol or back to Coreg given cardiomyopathy  -Not currently on ACEi/ARB/spironolactone/Entresto in the setting of permissive hypertension need given recent CVA and AKI on CKD  7. Acute on CKD stage II: -Slightly improved -On torsemide  8. Cheyenne Stokes respirations: -Transferred to the ICU on 10/11 -Persists  -High risk for intubation -Per PCCM  9. Anemia: -Stable -Remains on heparin gtt as above  10. Thrombocytopenia: -Improved    For questions or updates, please contact [place] Please consult [email or web address] for  contact info under Cardiology/STEMI.    Signed, Eula Listen, PA-C [facility] Pager: [phone] 04/14/2019, 7:24 AM

## 2019-04-14 NOTE — Progress Notes (Signed)
SLP Cancellation Note  Patient Details Name: [name] MRN: [id number] DOB: [birth year]   Cancelled treatment:       Reason Eval/Treat Not Completed: Medical issues which prohibited therapy;Fatigue/lethargy limiting ability to participate;Patient not medically ready(chart reviewed).  Pt's Neurological and Respiratory status' have declined, and noted pt became agitated yesterday; decreased R side movement. Pt's Apnea periods have become increasingly prominent full BiPAP -- MD stated high risk for intubation per note. Will hold on ST session at this time and will attempt to see pt tomorrow if medically appropriate. Recommend holding all po's d/t increased risk for aspiration. Recommend frequent oral care for hygiene and stimulation of swallowing; aspiration precautions.     Orinda Kenner, MS, CCC-SLP Watson,Katherine 04/14/2019, 3:00 PM

## 2019-04-14 NOTE — Progress Notes (Signed)
OT Cancellation Note  Patient Details Name: [name] MRN: [id number] DOB: 1955/11/04   Cancelled Treatment:    Reason Eval/Treat Not Completed: Medical issues which prohibited therapy . Consult received, chart reviewed. At this time, per MD notes, BiPAP mandatory and pt is at high risk for intubation. Will hold OT evaluation at this time and re-attempt at later date/time as pt is medically appropriate.   Jeni Salles, MPH, MS, OTR/L ascom [phone] 04/14/19, 2:42 PM

## 2019-04-14 NOTE — Consult Note (Signed)
ANTICOAGULATION CONSULT NOTE   Pharmacy Consult for Heparin Indication: atrial fibrillation  Patient Measurements: Height: 5\' 11"  (180.3 cm) Weight: 220 lb 0.3 oz (99.8 kg) IBW/kg (Calculated) : 75.3 Heparin Dosing Weight: 95.8 kg  Vital Signs: Temp: 97.8 F (36.6 C) (10/15 0200) Temp Source: Axillary (10/15 0200) BP: 128/91 (10/15 0200) Pulse Rate: 117 (10/15 0200)  Labs: Recent Labs    04/11/19 1906 04/12/19 0309 04/12/19 1221  04/13/19 0554 04/13/19 0746 04/13/19 1938 04/14/19 0326  HGB  --  11.4*  --   --  11.2*  --   --  11.7*  HCT  --  37.0*  --   --  37.2*  --   --  38.6*  PLT  --  130*  --   --  147*  --   --  164  APTT 119* >160* >160*  --   --   --   --   --   HEPARINUNFRC  --  3.24*  --    < >  --  1.06* 0.79* 0.49  CREATININE  --  1.97*  --   --  2.09*  --   --  2.05*   < > = values in this interval not displayed.    Estimated Creatinine Clearance: 45 mL/min (A) (by C-G formula based on SCr of 2.05 mg/dL (H)).  Medical History: Past Medical History:  Diagnosis Date  . CAD in native artery    a. stress echo 12/2007 abnl, EF > 55%, b. LHC 01/28/08: mLAD 30, D1 40, dLCx 72, pRCA 30, mRCA 70, mRCA lesion 2 80, PDA 90, s/p PCI/BMS to prox and distal RCA, s/p PCI/BMS to PDA; c. patient reports PCI/stenting x 2 in early 2017 at the [place] (no records on file) d. 06/2016: cath showing patent stents along RCA and LCx with moderate 40% stenosis along the LAD.   [name] Carotid arterial disease ([place])    a. 05/2018 CTA Head/neck: 80-90% stenosis @ L carotid bifurcation, extending into the prox LICA. 60% stenosis @ R carotid bifurcation. 50-75% bilateral distal cavernous carotid dzs.  . Chronic combined systolic (congestive) and diastolic (congestive) heart failure ([place])    a. 2009 Echo: > 55%; b. 06/2016: EF 25-30%; c. 12/2017 Echo: EF 30-35%, diff HK; d. 05/2018 Echo: 30-35%, mild conc LVH, diff HK. Mild MR. Mildly dil LA/RA.  [name] Chronic kidney disease   . COPD (chronic obstructive  pulmonary disease) ([place])   . Diabetes mellitus without complication ([place])   . Gout   . Hyperlipidemia   . Hypertension   . Hypertensive heart disease   . Ischemic cardiomyopathy    a. 05/2018: 30-35%.  . Obesity   . Persistent atrial fibrillation ([place]) 01/2017   a. cardioverted 9/18 to NSR; b. 12/2017 noted to be back in Afib-outpt dccv rec; c. CHA2DS2VASc = 6-->Xarelto.  . Stroke (cerebrum) ([place])    a. 05/2018 MRI: small patchy acute to subacute cortial and white matter infarct in the bilat cerebrum. Mild chronic small vessel ischmia; b. 05/2018 Carotid U/S: L-carotid bifurcation 80-90% stenosed, R- carotid bifurcation 60% stenosed.  . Tobacco abuse     Medications:  Medications Prior to Admission  Medication Sig Dispense Refill Last Dose  . atorvastatin (LIPITOR) 80 MG tablet Take 80 mg by mouth daily.   Past Week at Unknown time  . budesonide-formoterol (SYMBICORT) 160-4.5 MCG/ACT inhaler Inhale 2 puffs into the lungs 2 (two) times daily.   Past Week at Unknown time  . carvedilol (COREG) 6.25 MG  tablet Take 1 tablet (6.25 mg total) by mouth 2 (two) times daily with a meal. 60 tablet 1 Past Week at Unknown time  . magnesium oxide (MAG-OX) 400 MG tablet Take 400 mg by mouth 2 (two) times daily.   Past Week at Unknown time  . methocarbamol (ROBAXIN) 500 MG tablet Take 500 mg by mouth 4 (four) times daily.   Past Week at Unknown time  . nitroGLYCERIN (NITROSTAT) 0.4 MG SL tablet Place 0.4 mg under the tongue every 5 (five) minutes as needed for chest pain.   Unknown at PRN  . oxyCODONE (OXY IR/ROXICODONE) 5 MG immediate release tablet Take 5 mg by mouth 2 (two) times a day.   Past Week at Unknown time  . rivaroxaban (XARELTO) 20 MG TABS tablet Take 20 mg by mouth daily with supper.   Past Week at Unknown time  . sennosides-docusate sodium (SENOKOT-S) 8.6-50 MG tablet Take 2 tablets by mouth daily.   Past Week at Unknown time  . spironolactone (ALDACTONE) 25 MG tablet Take 1 tablet (25 mg  total) by mouth daily. 90 tablet 3 Past Week at Unknown time  . Tiotropium Bromide Monohydrate 2.5 MCG/ACT AERS Inhale 2 puffs into the lungs daily.   Past Week at Unknown time  . traZODone (DESYREL) 50 MG tablet Take 50 mg by mouth at bedtime.   Past Week at Unknown time  . docusate sodium (COLACE) 100 MG capsule Take 1 capsule (100 mg total) by mouth 2 (two) times daily. (Patient not taking: Reported on 04/05/2019) 60 capsule 6 Not Taking at Unknown time  . rivaroxaban (XARELTO) 15 MG TABS tablet Take 1 tablet (15 mg total) by mouth daily with supper. (Patient not taking: Reported on 04/05/2019) 30 tablet 1 Not Taking at Unknown time   Scheduled:  . atorvastatin  80 mg Oral Daily  . Chlorhexidine Gluconate Cloth  6 each Topical Q0600  . guaiFENesin  600 mg Oral BID  . insulin aspart  0-5 Units Subcutaneous QHS  . insulin aspart  0-9 Units Subcutaneous TID WC  . insulin glargine  10 Units Subcutaneous QHS  . magnesium oxide  400 mg Oral BID  . methocarbamol  500 mg Oral QID  . metoprolol tartrate  25 mg Oral BID  . polyethylene glycol  17 g Oral Daily  . senna-docusate  2 tablet Oral Daily  . tamsulosin  0.4 mg Oral Daily  . torsemide  40 mg Oral Daily   Infusions:  . sodium chloride Stopped (04/10/19 0742)  . heparin 400 Units/hr (04/13/19 2037)  . levETIRAcetam Stopped (04/13/19 2000)   PRN: sodium chloride, bisacodyl, hydrALAZINE, nitroGLYCERIN, ondansetron (ZOFRAN) IV, ondansetron (ZOFRAN) IV, simethicone  Assessment: Pharmacy consulted to start heparin and hold rivaroxaban for possible procedure. Will use aPTT to monitor heparin as patient has been on rivaroxaban. Last dose of rivaroxaban 10/9 @1757 . H&H trending down, thrombocytopenic at baseline but appears to be stable  Goal of Therapy:  HL 0.3-0.7  Monitor platelets by anticoagulation protocol: Yes   Plan:  10/14 1938 HL 0.79 supratherapeutic. Will decrease heparin drip to 400 units/hr. HL 10/15 at 0300.  10/15 0326 HL  0.49, therapeutic x 1. Continue current rate and recheck in 6 hours to confirm. CBC daily with morning labs.  11/15, PharmD 04/14/2019,4:47 AM

## 2019-04-15 ENCOUNTER — Inpatient Hospital Stay: Payer: No Typology Code available for payment source

## 2019-04-15 LAB — HEPARIN LEVEL (UNFRACTIONATED): Heparin Unfractionated: 0.25 IU/mL — ABNORMAL LOW (ref 0.30–0.70)

## 2019-04-15 LAB — BASIC METABOLIC PANEL
Anion gap: 4 — ABNORMAL LOW (ref 5–15)
BUN: 46 mg/dL — ABNORMAL HIGH (ref 8–23)
CO2: 29 mmol/L (ref 22–32)
Calcium: 9.2 mg/dL (ref 8.9–10.3)
Chloride: 112 mmol/L — ABNORMAL HIGH (ref 98–111)
Creatinine, Ser: 2.21 mg/dL — ABNORMAL HIGH (ref 0.61–1.24)
GFR calc Af Amer: 36 mL/min — ABNORMAL LOW (ref >60)
GFR calc non Af Amer: 31 mL/min — ABNORMAL LOW (ref >60)
Glucose, Bld: 134 mg/dL — ABNORMAL HIGH (ref 70–99)
Potassium: 4.2 mmol/L (ref 3.5–5.1)
Sodium: 145 mmol/L (ref 135–145)

## 2019-04-15 LAB — GLUCOSE, CAPILLARY
Glucose-Capillary: 155 mg/dL — ABNORMAL HIGH (ref 70–99)
Glucose-Capillary: 137 mg/dL — ABNORMAL HIGH (ref 70–99)
Glucose-Capillary: 164 mg/dL — ABNORMAL HIGH (ref 70–99)
Glucose-Capillary: 185 mg/dL — ABNORMAL HIGH (ref 70–99)

## 2019-04-15 LAB — CBC
HCT: 41.2 % (ref 39.0–52.0)
Hemoglobin: 12.3 g/dL — ABNORMAL LOW (ref 13.0–17.0)
MCH: 28.9 pg (ref 26.0–34.0)
MCHC: 29.9 g/dL — ABNORMAL LOW (ref 30.0–36.0)
MCV: 96.7 fL (ref 80.0–100.0)
Platelets: 156 10*3/uL (ref 150–400)
RBC: 4.26 MIL/uL (ref 4.22–5.81)
RDW: 22.5 % — ABNORMAL HIGH (ref 11.5–15.5)
WBC: 4.8 10*3/uL (ref 4.0–10.5)
nRBC: 1.3 % — ABNORMAL HIGH (ref 0.0–0.2)

## 2019-04-15 MED ORDER — RIVAROXABAN 15 MG PO TABS
15.0000 mg | ORAL_TABLET | Freq: Every day | ORAL | Status: DC
  Filled 2019-04-15 (×2): qty 1

## 2019-04-15 MED ORDER — HEPARIN BOLUS VIA INFUSION
1000.0000 [IU] | Freq: Once | INTRAVENOUS | Status: AC
  Administered 2019-04-15: 1000 [IU] via INTRAVENOUS
  Filled 2019-04-15: qty 1000

## 2019-04-15 NOTE — Plan of Care (Signed)
  Problem: Education: Goal: Knowledge of disease or condition will improve Outcome: Progressing Goal: Knowledge of secondary prevention will improve Outcome: Progressing Goal: Knowledge of patient specific risk factors addressed and post discharge goals established will improve Outcome: Progressing   Problem: Coping: Goal: Will verbalize positive feelings about self Outcome: Progressing Goal: Will identify appropriate support needs Outcome: Progressing   Problem: Health Behavior/Discharge Planning: Goal: Ability to manage health-related needs will improve Outcome: Progressing   Problem: Self-Care: Goal: Ability to participate in self-care as condition permits will improve Outcome: Progressing   Problem: Ischemic Stroke/TIA Tissue Perfusion: Goal: Complications of ischemic stroke/TIA will be minimized Outcome: Progressing   Problem: Education: Goal: Knowledge of General Education information will improve Description: Including pain rating scale, medication(s)/side effects and non-pharmacologic comfort measures Outcome: Progressing   Problem: Health Behavior/Discharge Planning: Goal: Ability to manage health-related needs will improve Outcome: Progressing   Problem: Clinical Measurements: Goal: Diagnostic test results will improve Outcome: Progressing   Problem: Pain Managment: Goal: General experience of comfort will improve Outcome: Progressing   Problem: Safety: Goal: Ability to remain free from injury will improve Outcome: Progressing   Problem: Skin Integrity: Goal: Risk for impaired skin integrity will decrease Outcome: Progressing

## 2019-04-15 NOTE — TOC Progression Note (Signed)
Transition of Care [facility]) - Progression Note    Patient Details  Name: [name] MRN: [id number] Date of Birth: 12-Oct-1955  Transition of [facility] CM/SW [place], [place] Phone Number: 04/15/2019, 12:36 PM  Clinical Narrative:  CSW spoke with patient's wife regarding discharge plan. Wife states that the medical team has explained to her that there is nothing else they can do for patient. Wife has decided to take patient home with hospice. Wife would like to use [facility] hospice services since they have been using [facility] home health. Wife states that she will need a hospital bed, oxygen and a wheel chair in order to bring patient home. CSW contacted Tim with [facility] hospice to set up services. TOC team will continue to follow for discharge planning.      Expected Discharge Plan: [place] Barriers to Discharge: Continued Medical Work up  Expected Discharge Plan and Services Expected Discharge Plan: [place]   Discharge Planning Services: CM Consult Post Acute Care Choice: Resumption of Svcs/PTA Provider Living arrangements for the past 2 months: Apartment                             [facility] Agency: [place] Date [place]: 04/05/19 Time [place] Agency Contacted: 1034 Representative spoke with at [place]: [place] ([place]) Interventions    Readmission Risk Interventions Readmission Risk Prevention Plan 01/24/2019  Transportation Screening Complete  HRI or [place] Complete  Social Work Consult for [place] Planning/Counseling Complete  Palliative Care Screening Not Applicable  Medication Review Press photographer) Complete  Some recent data might be hidden

## 2019-04-15 NOTE — Progress Notes (Signed)
Inpatient Rehabilitation Admissions Coordinator  Noted plans for d/c home with hospice. We will sign off at this time.  Danne Baxter, RN, MSN Rehab Admissions Coordinator [phone] 04/15/2019 4:13 PM

## 2019-04-15 NOTE — Evaluation (Signed)
Occupational Therapy Evaluation Patient Details Name: [name] MRN: [id number] DOB: 10-05-1955 Today's Date: 04/15/2019    History of Present Illness 63 yo male presented to ED 10/5 with dysarthria and mild R sided weakness, pt treated earlier in the day for [facility] and CHF, CT scan shows left frontal infarct. PMH includes CAD, CKD, COPD, DM, HLD, HTN, persistent Afib, stroke 05/2018. Pt transferred to the ICU on 1/012 with decline in functional status, new orders received 10/15.   Clinical Impression   Pt seen for OT re-evaluation and co-tx with PT this date. Pt currently pt demonstrates increased functional impairments in all aspects of ADL with significant impairments in RUE/RLE strength, coordination, and sensation. Pt essentially flaccid on R side, unable to elicit muscle activation and demonstrates decreased attention to R side at times but with verbal cues will attend. Pt demonstrates impaired communication skills demonstrating difficulty with saying his name (with extensive time and simple cues, Pt states his name is "[name]"), slight edema noted to dorsal aspect of R hand. This is a decline from initial evaluation where pt was able to move RUE and RLE in all planes, able to eat, and performing functional mobility and ADL with much less assist. Pt's respiration rate vacillated between undetectible to 28, requiring cues to breathe. HR generally >116, O2 sats 98% briefly dropping to high 80's. Retrograde massage performed and RUE repositioned to support edema prevention. Pt no longer appropriate for CIR, downgraded recommendation to SNF.    Follow Up Recommendations  SNF    Equipment Recommendations  Other (comment)(defer to next venue)    Recommendations for Other Services       Precautions / Restrictions Precautions Precautions: Fall Precaution Comments: monitor vitals Restrictions Weight Bearing Restrictions: No      Mobility Bed Mobility               General bed  mobility comments: deferred  Transfers                 General transfer comment: deferred    Balance                                           ADL either performed or assessed with clinical judgement   ADL Overall ADL's : Needs assistance/impaired                                       General ADL Comments: Max A for bathing, dressing, toileting at bed level     Vision Patient Visual Report: Other (comment)(pt denies, but difficult to assess 2/2 cognition)       Perception     Praxis      Pertinent Vitals/Pain Pain Assessment: No/denies pain Pain Intervention(s): Monitored during session     Hand Dominance Right   Extremity/Trunk Assessment Upper Extremity Assessment Upper Extremity Assessment: RUE deficits/detail RUE Deficits / Details: RUE now nearly flaccid in all joints, pt denies sensory deficts but then visually does not react to noxious stimuli to RUE, mild edema noted to dorsal aspect of R hand and fingers RUE Coordination: decreased fine motor;decreased gross motor LUE Deficits / Details: grossly 4/5 LUE Sensation: WNL LUE Coordination: WNL   Lower Extremity Assessment Lower Extremity Assessment: Defer to PT evaluation RLE Deficits / Details: nearly flaccid  Communication Communication Communication: Other (comment)(soft spoken, possible expressive vs receptive difficulties)   Cognition Arousal/Alertness: Awake/alert Behavior During Therapy: Flat affect Overall Cognitive Status: Difficult to assess Area of Impairment: Problem solving;Following commands;Orientation;Attention                 Orientation Level: Disoriented to;Situation;Time Current Attention Level: Focused   Following Commands: Follows one step commands inconsistently Safety/Judgement: Decreased awareness of deficits   Problem Solving: Slow processing;Decreased initiation;Difficulty sequencing;Requires verbal cues;Requires tactile  cues General Comments: difficulty with simple questions requiring significant additional time, cues, decreased attention to R side at times but with cues can attend   General Comments       Exercises Other Exercises Other Exercises: retrograde massage and positioning for RUE   Shoulder Instructions      Home Living Family/patient expects to be discharged to:: Private residence Living Arrangements: Spouse/significant other Available Help at Discharge: Family;Available 24 hours/day Type of Home: Apartment(handicap accessible) Home Access: Level entry     Home Layout: One level     Bathroom Shower/Tub: Producer, television/film/video: Handicapped height     Home Equipment: Environmental consultant - 2 wheels;Cane - single point;Electric scooter   Additional Comments: pt reports primarily using scooter      Prior Functioning/Environment Level of Independence: Independent with assistive device(s)        Comments: Per pt, uses scooter for most mobility, indep with ADL, limited functional mobility        OT Problem List: Decreased strength;Decreased coordination;Decreased activity tolerance;Impaired balance (sitting and/or standing);Decreased knowledge of use of DME or AE;Impaired UE functional use;Cardiopulmonary status limiting activity;Impaired sensation;Decreased cognition      OT Treatment/Interventions: Self-care/ADL training;Therapeutic exercise;Therapeutic activities;Neuromuscular education;DME and/or AE instruction;Patient/family education;Balance training;Cognitive remediation/compensation;Manual therapy    OT Goals(Current goals can be found in the care plan section) Acute Rehab OT Goals OT Goal Formulation: Patient unable to participate in goal setting Time For Goal Achievement: 04/29/19 Potential to Achieve Goals: Fair ADL Goals Pt Will Perform Grooming: sitting;with mod assist Additional ADL Goal #1: Pt will perform self ROM ex for RUE with handout, cues for sequencing, and  Mod assist Additional ADL Goal #2: Pt will perform bed mobility with Mod A +1 in preparation for seated ADL.  OT Frequency: Min 2X/week   Barriers to D/C:            Co-evaluation PT/OT/SLP Co-Evaluation/Treatment: Yes Reason for Co-Treatment: Complexity of the patient's impairments (multi-system involvement) PT goals addressed during session: Strengthening/ROM OT goals addressed during session: ADL's and self-care;Strengthening/ROM      AM-PAC OT "6 Clicks" Daily Activity     Outcome Measure Help from another person eating meals?: Total Help from another person taking care of personal grooming?: A Lot Help from another person toileting, which includes using toliet, bedpan, or urinal?: Total Help from another person bathing (including washing, rinsing, drying)?: A Lot Help from another person to put on and taking off regular upper body clothing?: A Lot Help from another person to put on and taking off regular lower body clothing?: A Lot 6 Click Score: 10   End of Session    Activity Tolerance: Patient tolerated treatment well Patient left: in bed;with call bell/phone within reach;with bed alarm set;Other (comment)(RUE repositioned)  OT Visit Diagnosis: Other abnormalities of gait and mobility (R26.89);Hemiplegia and hemiparesis;Other symptoms and signs involving cognitive function;Cognitive communication deficit (R41.841) Symptoms and signs involving cognitive functions: Cerebral infarction Hemiplegia - Right/Left: Right Hemiplegia - dominant/non-dominant: Dominant Hemiplegia - caused by: Cerebral  infarction                Time: 0901-0922 OT Time Calculation (min): 21 min Charges:  OT General Charges $OT Visit: 1 Visit OT Evaluation $OT Re-eval: 1 Re-eval  Richrd PrimeJamie Stiller, MPH, MS, OTR/L ascom [phone] 04/15/19, 11:03 AM

## 2019-04-15 NOTE — Progress Notes (Signed)
[facility] Daily Progress Note   Subjective: 7 Days Post-Op: Bilateral Carotid Angiogram  Patient seems to be breathing better this AM however will open eyes to name, doesn't not answer questions.   Objective: Vitals:   04/15/19 0800 04/15/19 0840 04/15/19 0900 04/15/19 1000  BP: (!) 127/94 (!) 127/94 116/75 (!) 113/97  Pulse: (!) 117 (!) 117 (!) 116 (!) 115  Resp: (!) 0  (!) 25 (!) 21  Temp:   98.7 F (37.1 C)   TempSrc:      SpO2: 100%  96% 96%  Weight:      Height:        Intake/Output Summary (Last 24 hours) at 04/15/2019 1052 Last data filed at 04/15/2019 1034 Gross per 24 hour  Intake 506.4 ml  Output 1475 ml  Net -968.6 ml   Physical Exam: Lethargic and confused, NAD CV: RRR Pulmonary: CTA Bilaterally, non-labored Abdomen: Soft, Nontender, Nondistended Vascular: Extremities are warm distally to toes   Laboratory: CBC    Component Value Date/Time   WBC 4.8 04/15/2019 0253   HGB 12.3 (L) 04/15/2019 0253   HGB 12.5 (L) 12/11/2012 1214   HCT 41.2 04/15/2019 0253   HCT 38.4 (L) 12/11/2012 1214   PLT 156 04/15/2019 0253   PLT 167 12/11/2012 1214   BMET    Component Value Date/Time   NA 145 04/15/2019 0253   NA 137 09/09/2018 1059   NA 141 12/11/2012 1214   K 4.2 04/15/2019 0253   K 4.2 12/11/2012 1214   CL 112 (H) 04/15/2019 0253   CL 108 (H) 12/11/2012 1214   CO2 29 04/15/2019 0253   CO2 28 12/11/2012 1214   GLUCOSE 134 (H) 04/15/2019 0253   GLUCOSE 112 (H) 12/11/2012 1214   BUN 46 (H) 04/15/2019 0253   BUN 24 09/09/2018 1059   BUN 18 12/11/2012 1214   CREATININE 2.21 (H) 04/15/2019 0253   CREATININE 1.26 12/11/2012 1214   CALCIUM 9.2 04/15/2019 0253   CALCIUM 8.8 12/11/2012 1214   GFRNONAA 31 (L) 04/15/2019 0253   GFRNONAA >60 12/11/2012 1214   GFRAA 36 (L) 04/15/2019 0253   GFRAA >60 12/11/2012 1214   Assessment/Planning: The patient is a 63 year old male with multiple medical issues including severe left carotid  artery stenosis and acute CVA 1) breathing seems to be improved this a.m. however still lethargic and confused. 2) if patient continues to improve would possibly consider carotid endarterectomy on Tuesday with [place] 3) will continue to monitor  Discussed with Dr. Eber Hong [facility] PA-C 04/15/2019 10:52 AM

## 2019-04-15 NOTE — Progress Notes (Signed)
[facility] NAME: [name]    MR#:  [id number]  DATE OF BIRTH:  [birth year]  SUBJECTIVE:  No acute events patient looks ill and not much movement right side  REVIEW OF SYSTEMS:    Unable to obtain from CVA  DRUG ALLERGIES:   Allergies  Allergen Reactions   Aspirin Other (See Comments)    Told not to take because of blood thinner   Entresto [Sacubitril-Valsartan] Other (See Comments)    Dizziness, weakness and stuttering   Ibuprofen Nausea And Vomiting   Tylenol [Acetaminophen] Nausea And Vomiting    VITALS:  Blood pressure (!) 113/97, pulse (!) 115, temperature 98.7 F (37.1 C), resp. rate (!) 21, height 5\' 11"  (1.803 m), weight 99.8 kg, SpO2 96 %.  PHYSICAL EXAMINATION:   Physical Exam  GENERAL:  63 y.o.-year-old patient lying in bed confused lethargic  EYES: Pupils equal, round, reactive to light and accommodation. No scleral icterus. Extraocular muscles intact.  HEENT: Head atraumatic, normocephalic. Oropharynx and nasopharynx clear.  NECK:  Supple, no jugular venous distention. No thyroid enlargement, no tenderness.  LUNGS: b/l rhonchii  CARDIOVASCULAR: IRR, IRR. tachyNo murmurs, rubs, or gallops.  ABDOMEN: Soft, nontender, nondistended. Bowel sounds present. No organomegaly or mass.  EXTREMITIES: No cyanosis, clubbing or edema b/l.    NEUROLOGIC: Patient with slight facial droop and right upper extremity weakness 2 out of 5 right lower extremity 2 out of 5  PSYCHIATRIC: The patient is lethargic confused.  SKIN: No obvious rash, lesion, or ulcer.    LABORATORY PANEL:   CBC Recent Labs  Lab 04/15/19 0253  WBC 4.8  HGB 12.3*  HCT 41.2  PLT 156   ------------------------------------------------------------------------------------------------------------------  Chemistries  Recent Labs  Lab 04/14/19 0326 04/15/19 0253  NA 146* 145  K 4.3 4.2  CL 108 112*  CO2 24 29  GLUCOSE 173* 134*    BUN 43* 46*  CREATININE 2.05* 2.21*  CALCIUM 9.5 9.2  MG 2.9*  --    ------------------------------------------------------------------------------------------------------------------  Cardiac Enzymes No results for input(s): TROPONINI in the last 168 hours. ------------------------------------------------------------------------------------------------------------------  RADIOLOGY:  US Venous Img Lower Bilateral  Result Date: 04/13/2019 CLINICAL DATA:  Leg swelling.  Shortness of breath. EXAM: BILATERAL LOWER EXTREMITY VENOUS DOPPLER ULTRASOUND TECHNIQUE: Gray-scale sonography with graded compression, as well as color Doppler and duplex ultrasound were performed to evaluate the lower extremity deep venous systems from the level of the common femoral vein and including the common femoral, femoral, profunda femoral, popliteal and calf veins including the posterior tibial, peroneal and gastrocnemius veins when visible. The superficial great saphenous vein was also interrogated. Spectral Doppler was utilized to evaluate flow at rest and with distal augmentation maneuvers in the common femoral, femoral and popliteal veins. COMPARISON:  None. FINDINGS: RIGHT LOWER EXTREMITY Common Femoral Vein: No evidence of thrombus. Normal compressibility, respiratory phasicity and response to augmentation. Saphenofemoral Junction: No evidence of thrombus. Normal compressibility and flow on color Doppler imaging. Profunda Femoral Vein: No evidence of thrombus. Normal compressibility and flow on color Doppler imaging. Femoral Vein: No evidence of thrombus. Normal compressibility, respiratory phasicity and response to augmentation. Popliteal Vein: No evidence of thrombus. Normal compressibility, respiratory phasicity and response to augmentation. Calf Veins: No evidence of thrombus. Normal compressibility and flow on color Doppler imaging. Other Findings:  None. LEFT LOWER EXTREMITY Common Femoral Vein: No evidence of  thrombus. Normal compressibility, respiratory phasicity and response to augmentation. Saphenofemoral Junction: No evidence of thrombus.  Normal compressibility and flow on color Doppler imaging. Profunda Femoral Vein: No evidence of thrombus. Normal compressibility and flow on color Doppler imaging. Femoral Vein: No evidence of thrombus. Normal compressibility, respiratory phasicity and response to augmentation. Popliteal Vein: No evidence of thrombus. Normal compressibility, respiratory phasicity and response to augmentation. Calf Veins: No evidence of thrombus. Normal compressibility and flow on color Doppler imaging. Other Findings:  None. IMPRESSION: No evidence of deep venous thrombosis in either lower extremity. Electronically Signed   By: Richarda Overlie M.D.   On: 04/13/2019 11:51   Dg Chest Port 1 View  Result Date: 04/15/2019 CLINICAL DATA:  Pulmonary disease.  COPD. EXAM: PORTABLE CHEST 1 VIEW COMPARISON:  Chest x-ray 04/13/2019, 04/04/2019. FINDINGS: Cardiomegaly. Bilateral pulmonary interstitial prominence noted. Interstitial edema and/or pneumonitis could present this fashion. Interstitial prominence is increased from prior exam no pleural effusion or pneumothorax. IMPRESSION: Cardiomegaly. Diffuse bilateral interstitial prominence. Interstitial edema and/or pneumonitis could present in this fashion. Interstitial prominence is increased from prior exam. Electronically Signed   By: Maisie Fus  Register   On: 04/15/2019 09:39     ASSESSMENT AND PLAN:   63 year old male with past medical history of persistent atrial fibrillation, ischemic cardiomyopathy, hypertension, hyperlipidemia, history of gout, diabetes, COPD, chronic combined systolic diastolic CHF, carotid artery disease who presented to the hospital due to right upper extremity weakness and noted to have an acute CVA.  1.   Multiple acute infarcts within the left cerebral hemisphere involving the cortex as well as subcortical and deep  periventricular white matter.   As per neurology continue Xarelto.  (Currently on heparin drip) CTA of the neck recently showing Severe soft and calcified plaque within the left carotid bifurcation and proximal ICA. Near complete occlusion versus complete occlusion with immediate reconstitution within the proximal left ICA. Given subtotal occlusion of left carotid bulb patient will need surgical endarterectomy planned for TUESDAY Continue statin  2.  Carotid artery stenosis:  Plan is for carotid endarterectomy Tuesday Patient has been evaluated for preop clearance by cardiology.  Patient at moderate risk for moderate risk procedure.  No further testing or intervention and reduce this risk.   3. AMS/Encephalopathy: From CVA??/Seziures Patient had an episode of confusion on 04/08/2019.  Was reevaluated by neurology.  Neurology thinks this could possibly be seizures.  Repeat CT head done reviewed expected CT appearance of the scattered small left hemisphere infarcts seen by MRI previously.  Patient started on Keppra.  Neurology following.  4.  Persistent atrial flutter/flutter: Heparin in place of Xarelto Patient will resume DOAC prior to admission Continue Lopressor  5.  Acute hypoxic/hypercapnic respiratory failure due to acute on chronic systolic CHF exacerbation, EF 30 to 35% with LVH.  Continue Lasix.  Sitter stopping due to increased creatinine Continue Lopressor Not currently on ACE inhibitor/ARB/Entresto in setting of permissive hypertension for CVA and acute kidney injury on chronic kidney disease  6. Hyperlipidemia - cont. Atorvastatin  7.  COPD without signs of exacerbation   8.  Diabetes type 2 with hyperglycemia- patient's A1c was as high as 8. Continue sliding scale  9.  BPH: Continue Flomax - Continue sliding scale insulin, low dose Lantus.  BS stable.    - await DM Coordinator input.    10.  Acute on chronic kidney disease stage II: Creatinine increased this  am Consider stopping Lasix    11.Cheyne-Stokes respiration: Patient transferred to ICU October 11.  DVT prophylaxis; patient now on heparin drip Palliative care consult appreciated  Plan of care  discussed with nursingt  CODE STATUS: DNR TOTAL TIME TAKING CARE OF THIS PATIENT: 25 minutes.   POSSIBLE D/C IN 6 DAYS, DEPENDING ON CLINICAL CONDITION.   Adrian SaranSital Deloria Brassfield M.D on 04/15/2019 at 11:04 AM  Between 7am to 6pm - Pager - [phone]  After 6pm go to [email or web address] - Therapist, nutritionalpassword EPAS [facility]  [employer]  Hospitalists  Office  [phone]  CC: Primary care physician; [facility]

## 2019-04-15 NOTE — Progress Notes (Signed)
Name: [name] MRN: [id number] DOB: 1956/06/06     CONSULTATION DATE: 04/13/2019  CHIEF COMPLAINT:  Acute respiratory failure  SIGNIFICANT EVENTS/STUDIES: 10/5Admitted to the hospital for right sided weakness, with left frontal infarct on CT and acute CHF exacerbation 10/6Echocardiogram demonstrated chronic systolic heart failure with EF 30-35% 10/8MRI demonstrated multiple acute infarcts within the left cerebral hemisphere involving the cortex. 10/11Transferred to ICU overnight because he had increased work of breathing and became somnolent 10/13 - mentation improved, plan for OR in am  10/14 - diuresed well overnight, noncompliant with therapy and BIPAP 10/15 - patient still altered, discussed with cardiology today, overall poor prognosis 10/16 - had family discourse regarding [name] - will transition to home hospice as per family wishes.   HISTORY OF PRESENT ILLNESS:   [name] is Harrington 63 yo male with history of CAD, known afib/flutter and cardiomyopathy with Harrington prior stroke who presented to the ED complaining of weakness and was found to have an acute CVA with right upper extremity weakness. MRI demonstrated multiple acute infarcts within the left cerebral hemisphere. CT of neck showed soft calcified plaque near occlusion of left carotid which was confirmed by angiography. Anticipate surgical endarterectomy this week per vascular. Pt was transferred to ICU overnight 10/11 for increased work of breathing and somnolence.    PAST MEDICAL HISTORY :   has Harrington past medical history of CAD in native artery, Carotid arterial disease ([name]), Chronic combined systolic (congestive) and diastolic (congestive) heart failure ([name]), Chronic kidney disease, COPD (chronic obstructive pulmonary disease) ([name]), Diabetes mellitus without complication ([name]), Gout, Hyperlipidemia, Hypertension, Hypertensive heart disease, Ischemic cardiomyopathy, Obesity, Persistent atrial fibrillation ([name]) (01/2017),  Stroke (cerebrum) ([name]), and Tobacco abuse.  has Harrington past surgical history that includes Cardiac catheterization (2009); Cardiac catheterization (2010); Coronary angioplasty (2009); Cardiac catheterization (N/Harrington, 07/28/2016); CARDIOVERSION (N/Harrington, 03/27/2017); CARDIOVERSION (N/Harrington, 07/30/2018); and CAROTID ANGIOGRAPHY (Bilateral, 04/08/2019). Prior to Admission medications   Medication Sig Start Date End Date Taking? Authorizing Provider  atorvastatin (LIPITOR) 80 MG tablet Take 80 mg by mouth daily.   Yes [provider]  budesonide-formoterol (SYMBICORT) 160-4.5 MCG/ACT inhaler Inhale 2 puffs into the lungs 2 (two) times daily.   Yes [provider]  carvedilol (COREG) 6.25 MG tablet Take 1 tablet (6.25 mg total) by mouth 2 (two) times daily with Harrington meal. 07/31/18  Yes [name], MD  magnesium oxide (MAG-OX) 400 MG tablet Take 400 mg by mouth 2 (two) times daily.   Yes [provider]  methocarbamol (ROBAXIN) 500 MG tablet Take 500 mg by mouth 4 (four) times daily.   Yes [provider]  nitroGLYCERIN (NITROSTAT) 0.4 MG SL tablet Place 0.4 mg under the tongue every 5 (five) minutes as needed for chest pain.   Yes [provider]  oxyCODONE (OXY IR/ROXICODONE) 5 MG immediate release tablet Take 5 mg by mouth 2 (two) times Harrington day. 01/14/19  Yes [provider]  rivaroxaban (XARELTO) 20 MG TABS tablet Take 20 mg by mouth daily with supper.   Yes [provider]  sennosides-docusate sodium (SENOKOT-S) 8.6-50 MG tablet Take 2 tablets by mouth daily.   Yes [provider]  spironolactone (ALDACTONE) 25 MG tablet Take 1 tablet (25 mg total) by mouth daily. 09/09/18  Yes Dunn, Areta Haber, PA-C  Tiotropium Bromide Monohydrate 2.5 MCG/ACT AERS Inhale 2 puffs into the lungs daily.   Yes [provider]  traZODone (DESYREL) 50 MG tablet Take 50 mg by mouth at bedtime.   Yes  [provider]  docusate sodium (COLACE) 100 MG capsule  Take 1 capsule (100 mg total) by mouth 2 (two) times daily. Patient not taking: Reported on 04/05/2019 09/02/18   Delma FreezeHackney, James A, FNP  rivaroxaban (XARELTO) 15 MG TABS tablet Take 1 tablet (15 mg total) by mouth daily with supper. Patient not taking: Reported on 04/05/2019 08/20/18   Enedina FinnerPatel, Sona, MD   Allergies  Allergen Reactions   Aspirin Other (See Comments)    Told not to take because of blood thinner   Entresto [Sacubitril-Valsartan] Other (See Comments)    Dizziness, weakness and stuttering   Ibuprofen Nausea And Vomiting   Tylenol [Acetaminophen] Nausea And Vomiting   Review of Systems:  Patient is altered unable to obtain ROS  VITAL SIGNS: Temp:  [97.8 F (36.6 C)-98.7 F (37.1 C)] 98.7 F (37.1 C) (10/16 0900) Pulse Rate:  [25-118] 116 (10/16 0900) Resp:  [0-27] 25 (10/16 0900) BP: (109-138)/(62-103) 116/75 (10/16 0900) SpO2:  [91 %-100 %] 96 % (10/16 0900)   I/O last 3 completed shifts: In: 837.7 [P.O.:270; I.V.:158.7; IV Piggyback:409] Out: 1725 [Urine:1725] No intake/output data recorded.   SpO2: 96 % O2 Flow Rate (L/min): 2 L/min   Physical Examination:  GENERAL:critically ill appearing, with increased respiratory rate HEAD: Normocephalic, atraumatic.  EYES: Pupils equal, round, reactive to light.  No scleral icterus.  MOUTH: Moist mucosal membrane. NECK: Supple. No JVD.  PULMONARY: mild pan-inspiratory crackles CARDIOVASCULAR: S1 and S2. Tachycardic. No murmurs, rubs, or gallops.  GASTROINTESTINAL: Soft, nontender, -distended. No masses. Positive bowel sounds. No hepatosplenomegaly.  MUSCULOSKELETAL: No swelling, clubbing, or edema.  NEUROLOGIC: Harrington&Ox3 SKIN:intact,warm,dry  I personally reviewed lab work that was obtained in last 24 hrs. CXR Independently reviewed  MEDICATIONS: I have reviewed all medications and confirmed regimen as documented   CULTURE RESULTS   Recent Results (from the past 240 hour(s))  MRSA PCR Screening     Status: None     Collection Time: 04/10/19  3:39 PM   Specimen: Nasal Mucosa; Nasopharyngeal  Result Value Ref Range Status   MRSA by PCR NEGATIVE NEGATIVE Final    Comment:        The GeneXpert MRSA Assay (FDA approved for NASAL specimens only), is one component of Harrington comprehensive MRSA colonization surveillance program. It is not intended to diagnose MRSA infection nor to guide or monitor treatment for MRSA infections. Performed at [facility], [address] [place], [place] [address]           IMAGING    No results found.    ASSESSMENT AND PLAN SYNOPSIS 63 yo male admitted to the ICU with acute hypoxic respiratory failure and lethargy secondary to acute CHF exacerbation and CVA with multiple acute infarcts of left hemisphere and severe stenosis (99%) of left internal carotid artery  ACUTE Hypoxic and Hypercapnic Respiratorysecondary toacute on chronicCHF exacerbation BIPAP is mandatory Oxygen as needed High risk for intubation - US LE due to report of sever dyspnea to r/o DVT/PE despite anticoag. Is negative -continue diuresis, patient is noncompliant with BIPAP - restrict fluid to <1200cc/24h -CXR from 10/16 - reviewed - no appreciable improvement despite >1 week of diuresis -Home hospice as per family wishes    Acute CVA Severe stenosis (99%) of left internal carotid artery - vascular surgery consulted - plan is to schedule endarterectomy -no definitive plan currently   Acute on chronic decompensated systolic cardiac failure - EF 60-45%30-35% -oxygen as needed -Diuresis with lasix bid -restrict fluids to 1200cc/24h -CXR -  shows pulmonary edema   Aucte on chronic renal failure stage 3 -follow chem 7 -follow UO -Avoid nephrotoxic agents  NEUROLOGY -acute CVA with multiple infarcts of left hemisphere and severe stenosis of left internal carotid artery (99%) - follow with neurology recs   CARDIAC ICU monitoring  GI GI PROPHYLAXIS as  indicated  NUTRITIONAL STATUS DIET-->TF's as tolerated Constipation protocol as indicated  ENDO - will use ICU hypoglycemic\Hyperglycemia protocol if needed  ELECTROLYTES -follow labs as needed -replace as needed -pharmacy consultation and following  DVT/GI PRX ordered TRANSFUSIONS AS NEEDED MONITOR FSBS ASSESS the need for LABS       Vida Rigger, M.D.  Pulmonary &Critical Care Medicine  [facility]  Critical care provider statement:  Critical care time (minutes):109 Critical care time was exclusive of: Separately billable procedures and  treating other patients Critical care was necessary to treat or prevent imminent or  life-threatening deterioration of the following conditions: Acute hypoxemic respiratory failure, acute decompensated systolic CHF, acute CVA, multiple chronic comorbid conditions Critical care was time spent personally by me on the following  activities: Development of treatment plan with patient or surrogate,  discussions with consultants, evaluation of patient's response to  treatment, examination of patient, obtaining history from patient or  surrogate, ordering and performing treatments and interventions, ordering  and review of laboratory studies and re-evaluation of patient's condition I assumed direction of critical care for this patient from another  provider in my specialty: no

## 2019-04-15 NOTE — Progress Notes (Addendum)
1450 Patient moved to room 128 via bed. Report called to [facility] RN on 1C at 1330.m Patient aware of move and so is wife.

## 2019-04-15 NOTE — Consult Note (Signed)
ANTICOAGULATION CONSULT NOTE   Pharmacy Consult for Heparin Indication: atrial fibrillation  Patient Measurements: Height: 5\' 11"  (180.3 cm) Weight: 220 lb 0.3 oz (99.8 kg) IBW/kg (Calculated) : 75.3 Heparin Dosing Weight: 95.8 kg  Vital Signs: BP: 126/90 (10/16 0100) Pulse Rate: 114 (10/16 0100)  Labs: Recent Labs    04/12/19 1221  04/13/19 0554  04/14/19 0326 04/14/19 0926 04/15/19 0253  HGB  --    < > 11.2*  --  11.7*  --  12.3*  HCT  --   --  37.2*  --  38.6*  --  41.2  PLT  --   --  147*  --  164  --  156  APTT >160*  --   --   --   --   --   --   HEPARINUNFRC  --    < >  --    < > 0.49 0.42 0.25*  CREATININE  --   --  2.09*  --  2.05*  --   --    < > = values in this interval not displayed.    Estimated Creatinine Clearance: 45 mL/min (A) (by C-G formula based on SCr of 2.05 mg/dL (H)).  Medical History: Past Medical History:  Diagnosis Date  . CAD in native artery    a. stress echo 12/2007 abnl, EF > 55%, b. LHC 01/28/08: mLAD 30, D1 40, dLCx 70, pRCA 30, mRCA 70, mRCA lesion 2 80, PDA 90, s/p PCI/BMS to prox and distal RCA, s/p PCI/BMS to PDA; c. patient reports PCI/stenting x 2 in early 2017 at the 2018 (no records on file) d. 06/2016: cath showing patent stents along RCA and LCx with moderate 40% stenosis along the LAD.   07/2016 Carotid arterial disease (HCC)    a. 05/2018 CTA Head/neck: 80-90% stenosis @ L carotid bifurcation, extending into the prox LICA. 60% stenosis @ R carotid bifurcation. 50-75% bilateral distal cavernous carotid dzs.  . Chronic combined systolic (congestive) and diastolic (congestive) heart failure (HCC)    a. 2009 Echo: > 55%; b. 06/2016: EF 25-30%; c. 12/2017 Echo: EF 30-35%, diff HK; d. 05/2018 Echo: 30-35%, mild conc LVH, diff HK. Mild MR. Mildly dil LA/RA.  06/2018 Chronic kidney disease   . COPD (chronic obstructive pulmonary disease) (HCC)   . Diabetes mellitus without complication (HCC)   . Gout   . Hyperlipidemia   . Hypertension   .  Hypertensive heart disease   . Ischemic cardiomyopathy    a. 05/2018: 30-35%.  . Obesity   . Persistent atrial fibrillation (HCC) 01/2017   a. cardioverted 9/18 to NSR; b. 12/2017 noted to be back in Afib-outpt dccv rec; c. CHA2DS2VASc = 6-->Xarelto.  . Stroke (cerebrum) (HCC)    a. 05/2018 MRI: small patchy acute to subacute cortial and white matter infarct in the bilat cerebrum. Mild chronic small vessel ischmia; b. 05/2018 Carotid U/S: L-carotid bifurcation 80-90% stenosed, R- carotid bifurcation 60% stenosed.  . Tobacco abuse     Medications:  Medications Prior to Admission  Medication Sig Dispense Refill Last Dose  . atorvastatin (LIPITOR) 80 MG tablet Take 80 mg by mouth daily.   Past Week at Unknown time  . budesonide-formoterol (SYMBICORT) 160-4.5 MCG/ACT inhaler Inhale 2 puffs into the lungs 2 (two) times daily.   Past Week at Unknown time  . carvedilol (COREG) 6.25 MG tablet Take 1 tablet (6.25 mg total) by mouth 2 (two) times daily with a meal. 60 tablet 1 Past Week at Unknown  time  . magnesium oxide (MAG-OX) 400 MG tablet Take 400 mg by mouth 2 (two) times daily.   Past Week at Unknown time  . methocarbamol (ROBAXIN) 500 MG tablet Take 500 mg by mouth 4 (four) times daily.   Past Week at Unknown time  . nitroGLYCERIN (NITROSTAT) 0.4 MG SL tablet Place 0.4 mg under the tongue every 5 (five) minutes as needed for chest pain.   Unknown at PRN  . oxyCODONE (OXY IR/ROXICODONE) 5 MG immediate release tablet Take 5 mg by mouth 2 (two) times a day.   Past Week at Unknown time  . rivaroxaban (XARELTO) 20 MG TABS tablet Take 20 mg by mouth daily with supper.   Past Week at Unknown time  . sennosides-docusate sodium (SENOKOT-S) 8.6-50 MG tablet Take 2 tablets by mouth daily.   Past Week at Unknown time  . spironolactone (ALDACTONE) 25 MG tablet Take 1 tablet (25 mg total) by mouth daily. 90 tablet 3 Past Week at Unknown time  . Tiotropium Bromide Monohydrate 2.5 MCG/ACT AERS Inhale 2 puffs  into the lungs daily.   Past Week at Unknown time  . traZODone (DESYREL) 50 MG tablet Take 50 mg by mouth at bedtime.   Past Week at Unknown time  . docusate sodium (COLACE) 100 MG capsule Take 1 capsule (100 mg total) by mouth 2 (two) times daily. (Patient not taking: Reported on 04/05/2019) 60 capsule 6 Not Taking at Unknown time  . rivaroxaban (XARELTO) 15 MG TABS tablet Take 1 tablet (15 mg total) by mouth daily with supper. (Patient not taking: Reported on 04/05/2019) 30 tablet 1 Not Taking at Unknown time   Scheduled:  . atorvastatin  80 mg Oral Daily  . Chlorhexidine Gluconate Cloth  6 each Topical Q0600  . furosemide  60 mg Intravenous BID  . guaiFENesin  600 mg Oral BID  . insulin aspart  0-5 Units Subcutaneous QHS  . insulin aspart  0-9 Units Subcutaneous TID WC  . insulin glargine  10 Units Subcutaneous QHS  . magnesium oxide  400 mg Oral BID  . methocarbamol  500 mg Oral QID  . metoprolol tartrate  50 mg Oral BID  . polyethylene glycol  17 g Oral Daily  . senna-docusate  2 tablet Oral Daily  . tamsulosin  0.4 mg Oral Daily   Infusions:  . sodium chloride Stopped (04/10/19 0742)  . heparin 400 Units/hr (04/15/19 0100)  . levETIRAcetam Stopped (04/14/19 1821)   PRN: sodium chloride, bisacodyl, hydrALAZINE, nitroGLYCERIN, ondansetron (ZOFRAN) IV, ondansetron (ZOFRAN) IV, simethicone  Assessment: Pharmacy consulted to start heparin and hold rivaroxaban for possible procedure. Will use aPTT to monitor heparin as patient has been on rivaroxaban. Last dose of rivaroxaban 10/9 @1757 . H&H trending down, thrombocytopenic at baseline but actually has improved since admission. The plan is for a carotid endarterectomy once the patient is more stable  0926 HL 0.42: this is the second consecutive therapeutic heparin level 10/16 @ 0253 HL 0.25, subtherapeutic.  CBC appears stable.   Goal of Therapy:  HL 0.3-0.7  Monitor platelets by anticoagulation protocol: Yes   Plan:   Heparin  1000 unit bolus x 1 then increase infusion to 600 units/hr  Recheck heparin level 6 hours after rate increased  F/U CBC in am  Ena Dawley, PharmD 04/15/2019,3:47 AM

## 2019-04-15 NOTE — Progress Notes (Signed)
Placed on cpap

## 2019-04-15 NOTE — Evaluation (Signed)
Physical Therapy Re-Evaluation Patient Details Name: [name] MRN: [id number] DOB: 07/07/1955 Today's Date: 04/15/2019   History of Present Illness  63 yo male presented to ED 10/5 with dysarthria and mild R sided weakness, pt treated earlier in the day for [name] and CHF, CT scan shows left frontal infarct. PMH includes CAD, CKD, COPD, DM, HLD, HTN, persistent Afib, stroke 05/2018. Pt transferred to the ICU on 10/12 with decline in functional status, initially on bipap. New orders received 10/15.    Clinical Impression  Patient in bed with OT at bedside, eyes open. Extended time need for patient to attend to questions, tasks, inconsistently followed one step commands. Pt able to state his last name when provided with options, reported his first name was "[name]". Pt reported he was at [facility]. Session limited today by patient's mental status as well as respiration status. Pt fluctuated from high 20s for RR, to undetectable, monitored for chest rises. Pt exhibited significant change in status since last PT session, RUE and RLE near flaccid no increased tone noted. Pt was able to perform several LLE exercises with PT with constant cues for one step commands with fair carryover but fatigued quickly.  Overall the patient demonstrated deficits (see "PT Problem List") that impede the patient's functional abilities, safety, and mobility and would benefit from skilled PT intervention. PT goals downgraded this session, and recommendation changed to STR with supervision/assistance - 24hr.     Follow Up Recommendations SNF;Supervision/Assistance - 24 hour    Equipment Recommendations  Other (comment)(TBD at next venue of care)    Recommendations for Other Services OT consult     Precautions / Restrictions Precautions Precautions: Fall Precaution Comments: monitor vitals, especially respirations Restrictions Weight Bearing Restrictions: No      Mobility  Bed Mobility                General bed mobility comments: deferred  Transfers                 General transfer comment: deferred  Ambulation/Gait             General Gait Details: deferred  Stairs            Wheelchair Mobility    Modified Rankin (Stroke Patients Only)       Balance       Sitting balance - Comments: deferred                                     Pertinent Vitals/Pain Pain Assessment: No/denies pain Pain Intervention(s): Monitored during session    Home Living Family/patient expects to be discharged to:: Private residence Living Arrangements: Spouse/significant other Available Help at Discharge: Family;Available 24 hours/day Type of Home: Apartment(handicap accessible) Home Access: Level entry     Home Layout: One level Home Equipment: Walker - 2 wheels;Cane - single point;Electric scooter Additional Comments: pt reports primarily using scooter    Prior Function Level of Independence: Independent with assistive device(s)         Comments: Per pt, uses scooter for most mobility, indep with ADL, limited functional mobility. PLOF gathered from chart, pt unable to provide clear information this session     Hand Dominance   Dominant Hand: Right    Extremity/Trunk Assessment   Upper Extremity Assessment Upper Extremity Assessment: Defer to OT evaluation RUE Deficits / Details: RUE now nearly flaccid in all joints, pt denies  sensory deficts but then visually does not react to noxious stimuli to RUE, mild edema noted to dorsal aspect of R hand and fingers RUE Coordination: decreased fine motor;decreased gross motor LUE Deficits / Details: grossly 4/5 LUE Sensation: WNL LUE Coordination: WNL    Lower Extremity Assessment Lower Extremity Assessment: RLE deficits/detail;LLE deficits/detail RLE Deficits / Details: nearly flaccid at all joints LLE Deficits / Details: Pt able to perform DF/PF, heel slide and mildly lift LLE off bed with  extended time and extended cues       Communication   Communication: Other (comment)(soft spoken, possible expressive vs receptive difficulties)  Cognition Arousal/Alertness: Awake/alert Behavior During Therapy: Flat affect Overall Cognitive Status: Difficult to assess Area of Impairment: Problem solving;Following commands;Orientation;Attention                 Orientation Level: Disoriented to;Situation;Time Current Attention Level: Focused Memory: Decreased short-term memory Following Commands: Follows one step commands inconsistently Safety/Judgement: Decreased awareness of deficits   Problem Solving: Slow processing;Decreased initiation;Difficulty sequencing;Requires verbal cues;Requires tactile cues General Comments: difficulty with simple questions requiring significant additional time, cues, decreased attention to R side at times but with cues can attend      General Comments      Exercises General Exercises - Lower Extremity Ankle Circles/Pumps: AAROM;Left;5 reps Quad Sets: AROM;Left;10 reps Heel Slides: AAROM;Left;10 reps;PROM;Right;5 reps Other Exercises Other Exercises: Cues to attend to R, able to attend with increased time and cueing. Bed repositioned to encourage R attention to the TV Other Exercises: retrograde massage and positioning for RUE   Assessment/Plan    PT Assessment Patient needs continued PT services  PT Problem List Decreased strength;Decreased mobility;Decreased safety awareness;Decreased range of motion;Decreased coordination;Decreased activity tolerance;Decreased balance;Decreased knowledge of use of DME;Decreased cognition       PT Treatment Interventions DME instruction;Therapeutic exercise;Gait training;Balance training;Stair training;Neuromuscular re-education;Functional mobility training;Therapeutic activities;Patient/family education;Wheelchair mobility training    PT Goals (Current goals can be found in the Care Plan section)   Acute Rehab PT Goals PT Goal Formulation: Patient unable to participate in goal setting Time For Goal Achievement: 04/29/19 Potential to Achieve Goals: Good    Frequency 7X/week   Barriers to discharge        Co-evaluation   Reason for Co-Treatment: Complexity of the patient's impairments (multi-system involvement);For patient/therapist safety PT goals addressed during session: Strengthening/ROM OT goals addressed during session: ADL's and self-care;Strengthening/ROM       AM-PAC PT "6 Clicks" Mobility  Outcome Measure Help needed turning from your back to your side while in a flat bed without using bedrails?: Total Help needed moving from lying on your back to sitting on the side of a flat bed without using bedrails?: Total Help needed moving to and from a bed to a chair (including a wheelchair)?: Total Help needed standing up from a chair using your arms (e.g., wheelchair or bedside chair)?: Total Help needed to walk in hospital room?: Total Help needed climbing 3-5 steps with a railing? : Total 6 Click Score: 6    End of Session Equipment Utilized During Treatment: Gait belt Activity Tolerance: Other (comment)(Limited by mental status, respiration status) Patient left: in bed;with call bell/phone within reach;with bed alarm set;with nursing/sitter in room Nurse Communication: Mobility status PT Visit Diagnosis: Difficulty in walking, not elsewhere classified (R26.2);Muscle weakness (generalized) (M62.81);Other symptoms and signs involving the nervous system (R29.898);Other abnormalities of gait and mobility (R26.89);Hemiplegia and hemiparesis Hemiplegia - Right/Left: Right Hemiplegia - caused by: Cerebral infarction    Time: [id number]  PT Time Calculation (min) (ACUTE ONLY): 19 min   Charges:   PT Evaluation $PT Re-evaluation: 1 Re-eval PT Treatments $Therapeutic Exercise: 8-22 mins        Lieutenant Diego PT, DPT 11:18 AM,04/15/19 [phone]

## 2019-04-15 NOTE — Progress Notes (Signed)
PT Cancellation Note  Patient Details Name: [name] MRN: [id number] DOB: 01-25-1956   Cancelled Treatment:    Reason Eval/Treat Not Completed: Other (comment)(PT orders discontinued  by RN. Per CSW note plan is for patient to go home on hospice. CSW aware of appropriate equipment needs at this time.)  Lieutenant Diego PT, DPT 2:30 PM,04/15/19 [phone]

## 2019-04-16 LAB — BASIC METABOLIC PANEL
Anion gap: 12 (ref 5–15)
BUN: 47 mg/dL — ABNORMAL HIGH (ref 8–23)
CO2: 25 mmol/L (ref 22–32)
Calcium: 9.5 mg/dL (ref 8.9–10.3)
Chloride: 109 mmol/L (ref 98–111)
Creatinine, Ser: 2.07 mg/dL — ABNORMAL HIGH (ref 0.61–1.24)
GFR calc Af Amer: 39 mL/min — ABNORMAL LOW (ref >60)
GFR calc non Af Amer: 33 mL/min — ABNORMAL LOW (ref >60)
Glucose, Bld: 200 mg/dL — ABNORMAL HIGH (ref 70–99)
Potassium: 4 mmol/L (ref 3.5–5.1)
Sodium: 146 mmol/L — ABNORMAL HIGH (ref 135–145)

## 2019-04-16 LAB — CBC
HCT: 42.6 % (ref 39.0–52.0)
Hemoglobin: 12.8 g/dL — ABNORMAL LOW (ref 13.0–17.0)
MCH: 28.8 pg (ref 26.0–34.0)
MCHC: 30 g/dL (ref 30.0–36.0)
MCV: 95.7 fL (ref 80.0–100.0)
Platelets: 157 10*3/uL (ref 150–400)
RBC: 4.45 MIL/uL (ref 4.22–5.81)
RDW: 22.4 % — ABNORMAL HIGH (ref 11.5–15.5)
WBC: 6.9 10*3/uL (ref 4.0–10.5)
nRBC: 0.6 % — ABNORMAL HIGH (ref 0.0–0.2)

## 2019-04-16 LAB — GLUCOSE, CAPILLARY
Glucose-Capillary: 133 mg/dL — ABNORMAL HIGH (ref 70–99)
Glucose-Capillary: 163 mg/dL — ABNORMAL HIGH (ref 70–99)
Glucose-Capillary: 242 mg/dL — ABNORMAL HIGH (ref 70–99)

## 2019-04-16 MED ORDER — TAMSULOSIN HCL 0.4 MG PO CAPS: 0.4000 mg | ORAL_CAPSULE | Freq: Every day | ORAL | 0 refills | Status: DC

## 2019-04-16 MED ORDER — RIVAROXABAN 15 MG PO TABS: 15.0000 mg | ORAL_TABLET | Freq: Every day | ORAL | 0 refills | Status: AC

## 2019-04-16 MED ORDER — LEVETIRACETAM 250 MG PO TABS: 250.0000 mg | ORAL_TABLET | Freq: Two times a day (BID) | ORAL | 0 refills | Status: AC

## 2019-04-16 MED ORDER — TAMSULOSIN HCL 0.4 MG PO CAPS: 0.4000 mg | ORAL_CAPSULE | Freq: Every day | ORAL | 0 refills | Status: AC

## 2019-04-16 NOTE — Progress Notes (Signed)
MD order received in [facility] to discharge pt home with hospice today; Care Management previously established hospice services with [facility]; verbally reviewed AVS with spouse listed as Delphia Grates via telephone call to her, no questions voiced at this time; EMS/911 contacted via telephone call for nonemergency transport to [address] [place] [id number]; pt's discharge pending arrival of EMS for nonemergency transport

## 2019-04-16 NOTE — TOC Transition Note (Signed)
Transition of Care [facility]) - CM/SW Discharge Note   Patient Details  Name: [name] MRN: [id number] Date of Birth: 1956-05-23  [facility] CM/SW Contact:  Latanya Maudlin, RN Phone Number: 04/16/2019, 10:55 AM   Clinical Narrative:  Patient to be discharged home with Hospice services. Patient will use [facility] as he used them for home health prior to hospitalization. Spoke with Esther Hardy at [facility] regarding discharge. Patient will use EMS. DME oxygen, hospital bed, etc was delivered yesterday evening.     Final next level of care: Home w Hospice Care Barriers to Discharge: No Barriers Identified   Patient Goals and CMS Choice   CMS Medicare.gov Compare Post Acute Care list provided to:: Patient Choice offered to / list presented to : Patient  Discharge Placement                       Discharge Plan and Services   Discharge Planning Services: CM Consult Post Acute Care Choice: Resumption of Svcs/PTA Provider                      [facility] Agency: [place] Date [place]: 04/05/19 Time [employer]: 1034 Representative spoke with at [place]: [place]) Interventions     Readmission Risk Interventions Readmission Risk Prevention Plan 04/16/2019 01/24/2019  Transportation Screening Complete Complete  HRI or [place] - Complete  Social Work Consult for [place] Planning/Counseling - Complete  Palliative Care Screening - Not Applicable  Medication Review Press photographer) Complete Complete  PCP or Specialist appointment within 3-5 days of discharge Complete -  [place] or Home Care Consult Complete -  SW Recovery Care/Counseling Consult Complete -  Palliative Care Screening Complete -  [place] Not Applicable -  Some recent data might be hidden

## 2019-04-16 NOTE — Discharge Instructions (Signed)
[facility] services

## 2019-04-16 NOTE — Plan of Care (Signed)
  Problem: Education: Goal: Knowledge of disease or condition will improve Outcome: Adequate for Discharge Goal: Knowledge of secondary prevention will improve Outcome: Adequate for Discharge Goal: Knowledge of patient specific risk factors addressed and post discharge goals established will improve Outcome: Adequate for Discharge   Problem: Coping: Goal: Will verbalize positive feelings about self Outcome: Adequate for Discharge Goal: Will identify appropriate support needs Outcome: Adequate for Discharge   Problem: Health Behavior/Discharge Planning: Goal: Ability to manage health-related needs will improve Outcome: Adequate for Discharge   Problem: Self-Care: Goal: Ability to participate in self-care as condition permits will improve Outcome: Adequate for Discharge   Problem: Ischemic Stroke/TIA Tissue Perfusion: Goal: Complications of ischemic stroke/TIA will be minimized Outcome: Adequate for Discharge   Problem: Education: Goal: Knowledge of General Education information will improve Description: Including pain rating scale, medication(s)/side effects and non-pharmacologic comfort measures Outcome: Adequate for Discharge   Problem: Health Behavior/Discharge Planning: Goal: Ability to manage health-related needs will improve Outcome: Adequate for Discharge   Problem: Clinical Measurements: Goal: Diagnostic test results will improve Outcome: Adequate for Discharge   Problem: Pain Managment: Goal: General experience of comfort will improve Outcome: Adequate for Discharge   Problem: Safety: Goal: Ability to remain free from injury will improve Outcome: Adequate for Discharge   Problem: Skin Integrity: Goal: Risk for impaired skin integrity will decrease Outcome: Adequate for Discharge

## 2019-04-16 NOTE — Progress Notes (Signed)
EMS present for pt discharge; discharge packet given to EMS personnel to take to the pt's house; pt discharged via stretcher by EMS home with hospice services; telephone call to Delphia Grates [phone] to advise that EMS was present for pt discharge

## 2019-04-16 NOTE — Discharge Summary (Signed)
[employer] - [place] [facility]   PATIENT NAME: [name]    MR#:  [id number]  DATE OF BIRTH:  [birth year]  DATE OF ADMISSION:  04/04/2019 ADMITTING PHYSICIAN: Oralia Manis, MD  DATE OF DISCHARGE: 04/16/2019  PRIMARY CARE PHYSICIAN: [facility]    ADMISSION DIAGNOSIS:  Weakness [R53.1] Cerebrovascular accident (CVA), unspecified mechanism (HCC) [I63.9]  DISCHARGE DIAGNOSIS:  Principal Problem:   Stroke [facility]) Active Problems:   COPD (chronic obstructive pulmonary disease) (HCC)   HLD (hyperlipidemia)   CAD in native artery   Atrial fibrillation with rapid ventricular response (HCC)   HFrEF (heart failure with reduced ejection fraction) (HCC)   HTN (hypertension)   DM (diabetes mellitus) (HCC)   Persistent atrial fibrillation (HCC)   Goals of care, counseling/discussion   Palliative care by specialist   DNR (do not resuscitate) discussion   SECONDARY DIAGNOSIS:   Past Medical History:  Diagnosis Date  . CAD in native artery    a. stress echo 12/2007 abnl, EF > 55%, b. LHC 01/28/08: mLAD 30, D1 40, dLCx 70, pRCA 30, mRCA 70, mRCA lesion 2 80, PDA 90, s/p PCI/BMS to prox and distal RCA, s/p PCI/BMS to PDA; c. patient reports PCI/stenting x 2 in early 2017 at the [place] (no records on file) d. 06/2016: cath showing patent stents along RCA and LCx with moderate 40% stenosis along the LAD.   [name] Carotid arterial disease (HCC)    a. 05/2018 CTA Head/neck: 80-90% stenosis @ L carotid bifurcation, extending into the prox LICA. 60% stenosis @ R carotid bifurcation. 50-75% bilateral distal cavernous carotid dzs.  . Chronic combined systolic (congestive) and diastolic (congestive) heart failure (HCC)    a. 2009 Echo: > 55%; b. 06/2016: EF 25-30%; c. 12/2017 Echo: EF 30-35%, diff HK; d. 05/2018 Echo: 30-35%, mild conc LVH, diff HK. Mild MR. Mildly dil LA/RA.  [name] Chronic kidney disease   . COPD (chronic obstructive pulmonary disease) (HCC)   . Diabetes  mellitus without complication (HCC)   . Gout   . Hyperlipidemia   . Hypertension   . Hypertensive heart disease   . Ischemic cardiomyopathy    a. 05/2018: 30-35%.  . Obesity   . Persistent atrial fibrillation (HCC) 01/2017   a. cardioverted 9/18 to NSR; b. 12/2017 noted to be back in Afib-outpt dccv rec; c. CHA2DS2VASc = 6-->Xarelto.  . Stroke (cerebrum) (HCC)    a. 05/2018 MRI: small patchy acute to subacute cortial and white matter infarct in the bilat cerebrum. Mild chronic small vessel ischmia; b. 05/2018 Carotid U/S: L-carotid bifurcation 80-90% stenosed, R- carotid bifurcation 60% stenosed.  . Tobacco abuse     HOSPITAL COURSE:  63 year old male with past medical history of persistent atrial fibrillation, ischemic cardiomyopathy, hypertension, hyperlipidemia, history of gout, diabetes, COPD, chronic combined systolic diastolic CHF, carotid artery disease who presented to the hospital due to right upper extremity weakness and noted to have an acute CVA.  1.   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   As per neurology continue Xarelto.  (Currently on heparin drip) CTA of the neck recently showing Severe soft and calcified plaque within the left carotid bifurcation and proximal ICA. Near complete occlusion versus complete occlusion with immediate reconstitution within the proximal left ICA. Patient continued to show decline.  Family has decided on hospice at home.  2.  Carotid artery stenosis:   Family has decided against  intervention due to his decline  3. AMS/Encephalopathy: From CVA??/Seziures Patient had an episode of confusion on 04/08/2019.  Was reevaluated by neurology.  Neurology thinks this could possibly be seizures.  Repeat CT head done reviewed expected CT appearance of the scattered small left hemisphere infarcts seen by MRI previously.  Patient started on Keppra.   4.   Persistent atrial flutter/flutter:  He will continue Coreg and Xarelto if tolerated   5.  Acute hypoxic/hypercapnic respiratory failure due to acute on chronic systolic CHF exacerbation, EF 30 to 35% with LVH.  Continue Coreg  not currently on ACE inhibitor/ARB/Entresto in setting of permissive hypertension for CVA and acute kidney injury on chronic kidney disease  6. Hyperlipidemia - cont. Atorvastatin  7.  COPD without signs of exacerbation   8.  Diabetes type 2 with hyperglycemia- patient's A1c was as high as 8.  9.  BPH: Continue Flomax 10.  Acute on chronic kidney disease stage II:  11.Cheyne-Stokes respiration: Patient transferred to ICU October 11.  Patient continued to show decline in his status.  Family has decided for hospice at home DISCHARGE CONDITIONS AND DIET:   Diet as tolerated dysphagia 1 with aspiration precautions guarded prognosis  CONSULTS OBTAINED:  Treatment Team:  Neville Route, MD Schnier, Dolores Lory, MD [facility], Palliative  DRUG ALLERGIES:   Allergies  Allergen Reactions  . Aspirin Other (See Comments)    Told not to take because of blood thinner  . Entresto [Sacubitril-Valsartan] Other (See Comments)    Dizziness, weakness and stuttering  . Ibuprofen Nausea And Vomiting  . Tylenol [Acetaminophen] Nausea And Vomiting    DISCHARGE MEDICATIONS:   Allergies as of 04/16/2019      Reactions   Aspirin Other (See Comments)   Told not to take because of blood thinner   Entresto [sacubitril-valsartan] Other (See Comments)   Dizziness, weakness and stuttering   Ibuprofen Nausea And Vomiting   Tylenol [acetaminophen] Nausea And Vomiting      Medication List    STOP taking these medications   budesonide-formoterol 160-4.5 MCG/ACT inhaler Commonly known as: SYMBICORT   docusate sodium 100 MG capsule Commonly known as: COLACE   magnesium oxide 400 MG tablet Commonly known as: MAG-OX   methocarbamol 500 MG tablet Commonly known as:  ROBAXIN   nitroGLYCERIN 0.4 MG SL tablet Commonly known as: NITROSTAT   oxyCODONE 5 MG immediate release tablet Commonly known as: Oxy IR/ROXICODONE   sennosides-docusate sodium 8.6-50 MG tablet Commonly known as: SENOKOT-S   spironolactone 25 MG tablet Commonly known as: ALDACTONE   Tiotropium Bromide Monohydrate 2.5 MCG/ACT Aers   traZODone 50 MG tablet Commonly known as: DESYREL     TAKE these medications   atorvastatin 80 MG tablet Commonly known as: LIPITOR Take 80 mg by mouth daily.   carvedilol 6.25 MG tablet Commonly known as: COREG Take 1 tablet (6.25 mg total) by mouth 2 (two) times daily with a meal.   levETIRAcetam 250 MG tablet Commonly known as: Keppra Take 1 tablet (250 mg total) by mouth 2 (two) times daily.   Rivaroxaban 15 MG Tabs tablet Commonly known as: XARELTO Take 1 tablet (15 mg total) by mouth daily. What changed:   when to take this  Another medication with the same name was removed. Continue taking this medication, and follow the directions you see here.   tamsulosin 0.4 MG Caps capsule Commonly known as: FLOMAX Take 1 capsule (0.4 mg total) by mouth daily. Start taking on: April 17, 2019  Today   CHIEF COMPLAINT:  Patient is to be discharged today with hospice   VITAL SIGNS:  Blood pressure (!) 140/99, pulse 94, temperature 97.6 F (36.4 C), resp. rate 18, height  (1.803 m), weight 99.8 kg, SpO2 100 %.   REVIEW OF SYSTEMS:  Review of Systems  Unable to perform ROS: Acuity of condition     PHYSICAL EXAMINATION:  GENERAL:  63 y.o.-year-old patient lying in bed confused lethargic  EYES: Pupils equal, round, reactive to light and accommodation. No scleral icterus. Extraocular muscles intact.  HEENT: Head atraumatic, normocephalic. Oropharynx and nasopharynx clear.  NECK:  Supple, no jugular venous distention. No thyroid enlargement, no tenderness.  LUNGS: b/l rhonchii  CARDIOVASCULAR: IRR, IRR.  tachyNo murmurs, rubs, or gallops.  ABDOMEN: Soft, nontender, nondistended. Bowel sounds present. No organomegaly or mass.  EXTREMITIES: No cyanosis, clubbing or edema b/l.    NEUROLOGIC: Patient with slight facial droop and right upper extremity weakness 2 out of 5 right lower extremity 2 out of 5  PSYCHIATRIC: The patient is lethargic confused.  SKIN: No obvious rash, lesion, or ulcer.    DATA REVIEW:   CBC Recent Labs  Lab 04/16/19 0611  WBC 6.9  HGB 12.8*  HCT 42.6  PLT 157    Chemistries  Recent Labs  Lab 04/14/19 0326  04/16/19 0611  NA 146*   < > 146*  K 4.3   < > 4.0  CL 108   < > 109  CO2 24   < > 25  GLUCOSE 173*   < > 200*  BUN 43*   < > 47*  CREATININE 2.05*   < > 2.07*  CALCIUM 9.5   < > 9.5  MG 2.9*  --   --    < > = values in this interval not displayed.    Cardiac Enzymes No results for input(s): TROPONINI in the last 168 hours.  Microbiology Results  @  RADIOLOGY:  Dg Chest Port 1 View  Result Date: 04/15/2019 CLINICAL DATA:  Pulmonary disease.  COPD. EXAM: PORTABLE CHEST 1 VIEW COMPARISON:  Chest x-ray 04/13/2019, 04/04/2019. FINDINGS: Cardiomegaly. Bilateral pulmonary interstitial prominence noted. Interstitial edema and/or pneumonitis could present this fashion. Interstitial prominence is increased from prior exam no pleural effusion or pneumothorax. IMPRESSION: Cardiomegaly. Diffuse bilateral interstitial prominence. Interstitial edema and/or pneumonitis could present in this fashion. Interstitial prominence is increased from prior exam. Electronically Signed   By: Maisie Fus  Register   On: 04/15/2019 09:39      Allergies as of 04/16/2019      Reactions   Aspirin Other (See Comments)   Told not to take because of blood thinner   Entresto [sacubitril-valsartan] Other (See Comments)   Dizziness, weakness and stuttering   Ibuprofen Nausea And Vomiting   Tylenol [acetaminophen] Nausea And Vomiting      Medication List     STOP taking these medications   budesonide-formoterol 160-4.5 MCG/ACT inhaler Commonly known as: SYMBICORT   docusate sodium 100 MG capsule Commonly known as: COLACE   magnesium oxide 400 MG tablet Commonly known as: MAG-OX   methocarbamol 500 MG tablet Commonly known as: ROBAXIN   nitroGLYCERIN 0.4 MG SL tablet Commonly known as: NITROSTAT   oxyCODONE 5 MG immediate release tablet Commonly known as: Oxy IR/ROXICODONE   sennosides-docusate sodium 8.6-50 MG tablet Commonly known as: SENOKOT-S   spironolactone 25 MG tablet Commonly known as: ALDACTONE   Tiotropium Bromide Monohydrate 2.5 MCG/ACT Aers   traZODone 50 MG tablet Commonly  known as: DESYREL     TAKE these medications   atorvastatin 80 MG tablet Commonly known as: LIPITOR Take 80 mg by mouth daily.   carvedilol 6.25 MG tablet Commonly known as: COREG Take 1 tablet (6.25 mg total) by mouth 2 (two) times daily with a meal.   levETIRAcetam 250 MG tablet Commonly known as: Keppra Take 1 tablet (250 mg total) by mouth 2 (two) times daily.   Rivaroxaban 15 MG Tabs tablet Commonly known as: XARELTO Take 1 tablet (15 mg total) by mouth daily. What changed:   when to take this  Another medication with the same name was removed. Continue taking this medication, and follow the directions you see here.   tamsulosin 0.4 MG Caps capsule Commonly known as: FLOMAX Take 1 capsule (0.4 mg total) by mouth daily. Start taking on: April 17, 2019          Management plans discussed with tnursing Stable for discharge hospice at home  Patient should follow up with hospice  CODE STATUS:     Code Status Orders  (From admission, onward)         Start     Ordered   04/14/19 1254  Do not attempt resuscitation (DNR)  Continuous    Question Answer Comment  In the event of cardiac or respiratory ARREST Do not call a "code blue"   In the event of cardiac or respiratory ARREST Do not perform Intubation, CPR,  defibrillation or ACLS   In the event of cardiac or respiratory ARREST Use medication by any route, position, wound care, and other measures to relive pain and suffering. May use oxygen, suction and manual treatment of airway obstruction as needed for comfort.      04/14/19 1253        Code Status History    Date Active Date Inactive Code Status Order ID Comments User Context   04/04/2019 2323 04/14/2019 1253 Full Code [id number]  Oralia Manis, MD Inpatient   01/22/2019 0348 01/26/2019 1621 Full Code [id number]  Arnaldo Natal, MD Inpatient   08/18/2018 1028 08/20/2018 1510 Full Code [id number]  Adrian Saran, MD ED   07/26/2018 1207 07/31/2018 1751 Partial Code [id number]  Erin Fulling, MD Inpatient   07/25/2018 2020 07/26/2018 1207 Full Code [id number]  Gery Pray, MD Inpatient   06/13/2018 2049 06/15/2018 1706 Full Code [id number]  Gery Pray, MD Inpatient   01/19/2018 2358 01/21/2018 1343 Full Code [id number]  Cammy Copa, MD ED   03/26/2017 0636 03/27/2017 1729 Full Code [id number]  Arnaldo Natal, MD Inpatient   02/18/2017 2112 02/19/2017 1707 Full Code [id number]  Houston Siren, MD Inpatient   01/05/2017 2246 01/07/2017 1614 Full Code [id number]  Altamese Dilling, MD Inpatient   07/24/2016 0235 07/31/2016 1401 Full Code [id number]  Oralia Manis, MD Inpatient   Advance Care Planning Activity    Advance Directive Documentation     Most Recent Value  Type of Advance Directive  Living will  Pre-existing out of facility DNR order (yellow form or pink MOST form)  -  "MOST" Form in Place?  -      TOTAL TIME TAKING CARE OF THIS PATIENT: 38 minutes.    Note: This dictation was prepared with Dragon dictation along with smaller phrase technology. Any transcriptional errors that result from this process are unintentional.  Adrian Saran M.D on 04/16/2019 at 9:56 AM  Between 7am to 6pm - Pager - [phone] After 6pm go to [email or web address] - password [email or web address]  [facility]  [place] [facility]   Office  [phone]  CC: Primary care physician; [facility]

## 2019-04-18 ENCOUNTER — Ambulatory Visit: Payer: No Typology Code available for payment source | Admitting: Family

## 2019-04-27 ENCOUNTER — Telehealth (HOSPITAL_COMMUNITY): Payer: Self-pay

## 2019-04-27 NOTE — Telephone Encounter (Signed)
Spoke with wife and she advised me that [name] had several mini strokes and passed away on Hosp[facility] a week ago.  Gave her my condolences.  [place] [phone]

## 2019-05-01 DEATH — deceased

## 2019-05-02 ENCOUNTER — Ambulatory Visit: Payer: No Typology Code available for payment source | Admitting: Family

## 2019-05-05 LAB — BLOOD GAS, ARTERIAL
Acid-base deficit: 4.9 mmol/L — ABNORMAL HIGH (ref 0.0–2.0)
Bicarbonate: 19.8 mmol/L — ABNORMAL LOW (ref 20.0–28.0)
FIO2: 0.28
O2 Saturation: 78.6 %
Patient temperature: 37
pCO2 arterial: 35 mmHg (ref 32.0–48.0)
pH, Arterial: 7.36 (ref 7.350–7.450)
pO2, Arterial: 45 mmHg — ABNORMAL LOW (ref 83.0–108.0)

## 2019-11-16 IMAGING — CR DG CHEST 2V
1 series · 2 of 2 positions shown · non-contrast
Comparison: 07/26/2018.

CLINICAL DATA: Recent stroke. Chronic systolic heart failure.

EXAM:
CHEST - 2 VIEW

[Series 1: dg chest 2 view · 0.14mm/px · 2 of 2 slices shown]
[im 1/2]
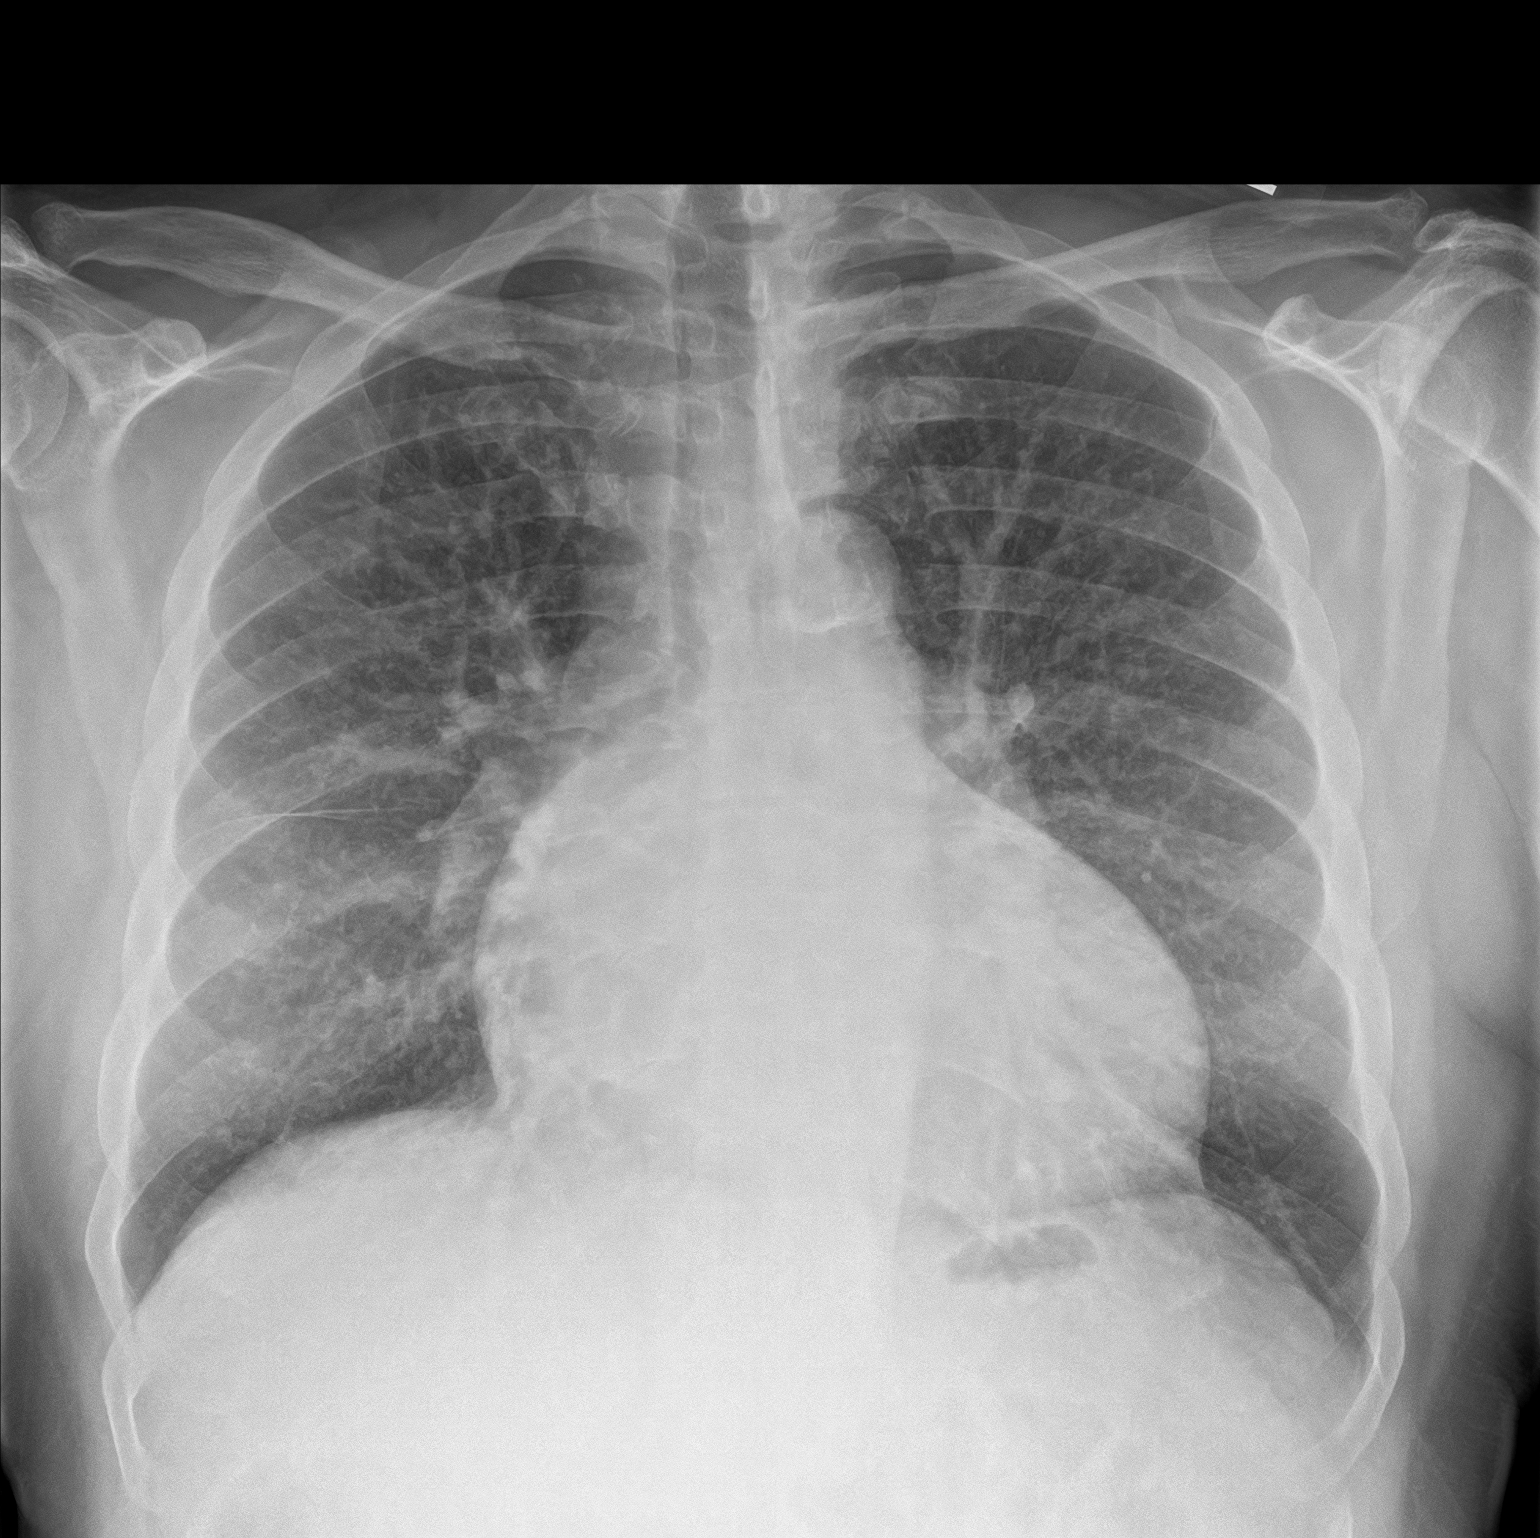
[im 2/2]
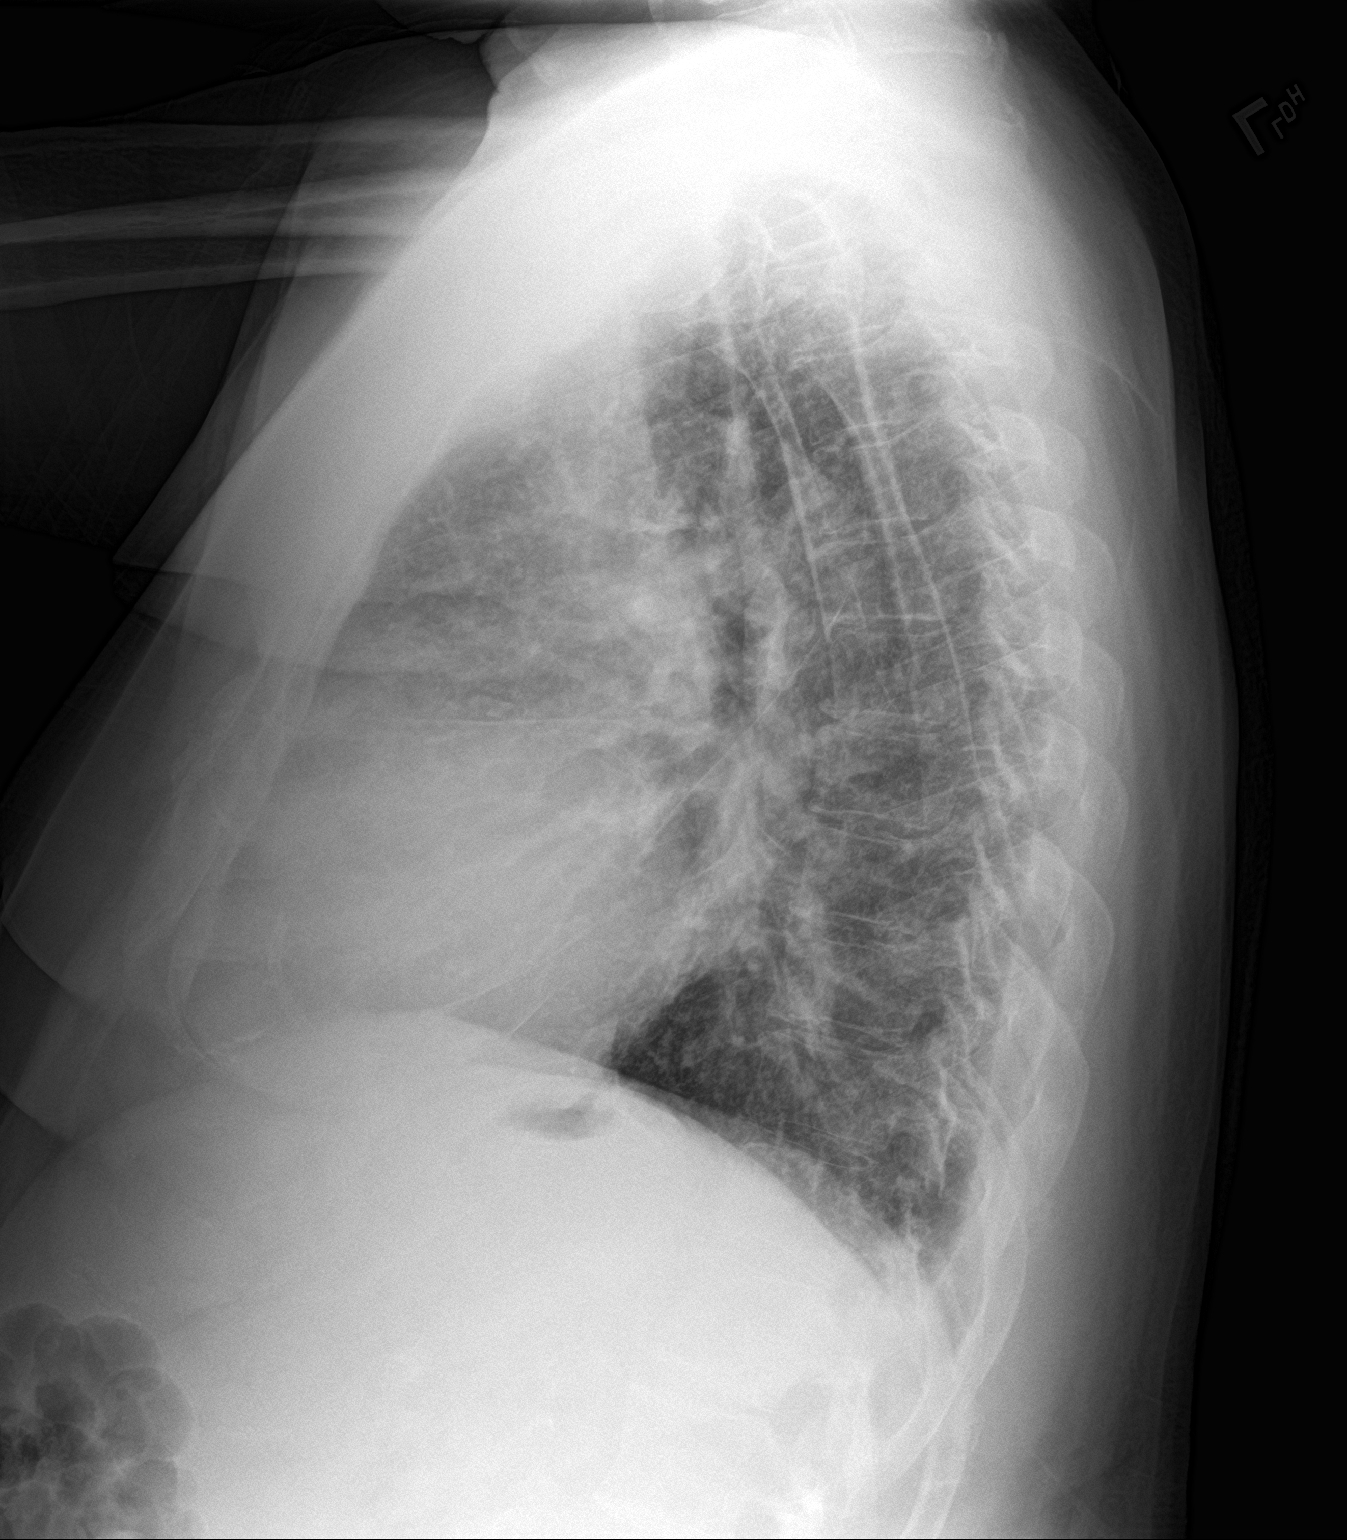

[2 of 2 positions shown; findings below may reference images not displayed]

FINDINGS: Cardiac enlargement. Calcified tortuous aorta. Mild vascular
congestion. No consolidation or edema. No effusion or pneumothorax.
Bones unremarkable.
IMPRESSION: Cardiomegaly.  Mild vascular congestion.  No frank CHF or pneumonia.

## 2020-07-15 IMAGING — CT CT HEAD W/O CM
3 series · 15 of 47 positions shown, 18 images · non-contrast
Comparison: 04/08/2019

CLINICAL DATA: Encephalopathy

EXAM:
CT HEAD WITHOUT CONTRAST
TECHNIQUE: Contiguous axial images were obtained from the base of the skull
through the vertex without intravenous contrast.

[Series 2: head wo · axial · 0.44mm/px · z∈[-156,-31]mm · 9 of 31 slices shown, 12 images]
[im 3/31  brain]
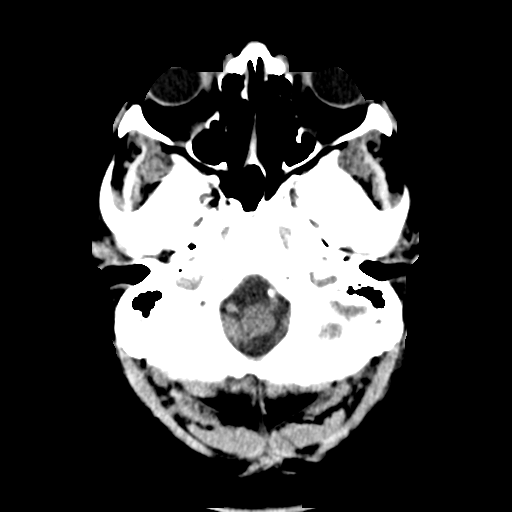
[im 3/31  bone]
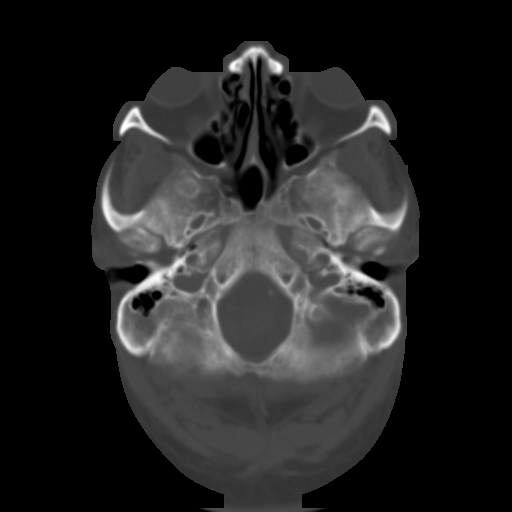
[im 6/31  brain]
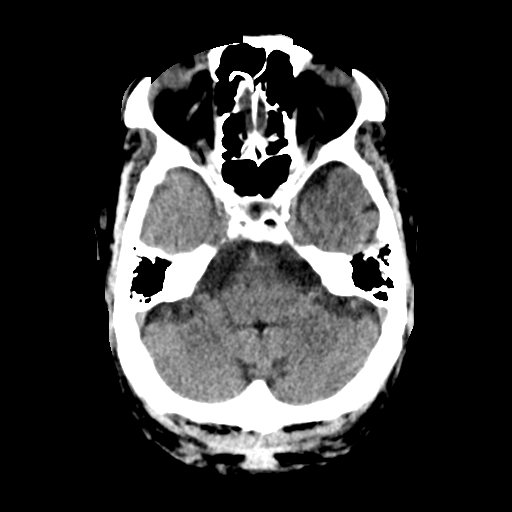
[im 9/31  brain]
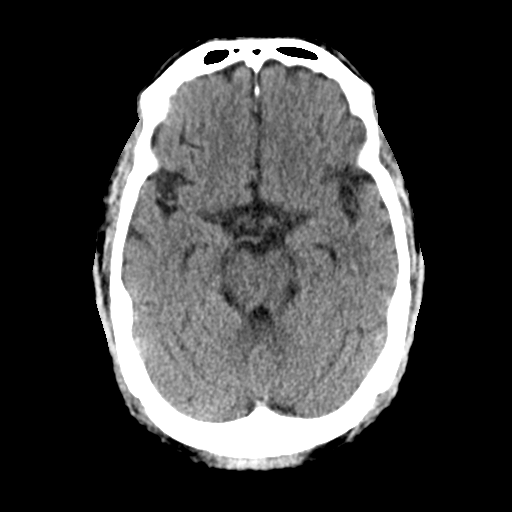
[im 12/31  brain]
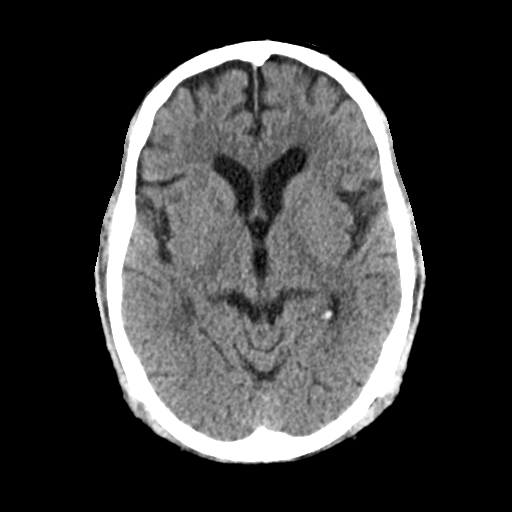
[im 16/31  brain]
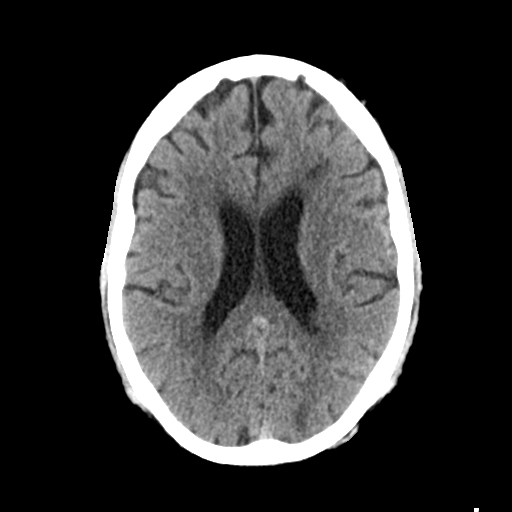
[im 16/31  bone]
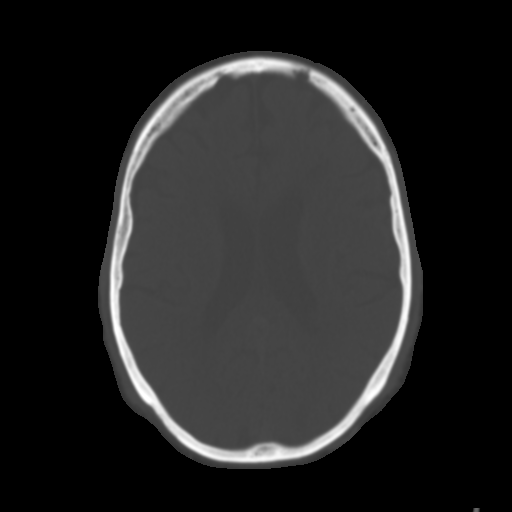
[im 19/31  brain]
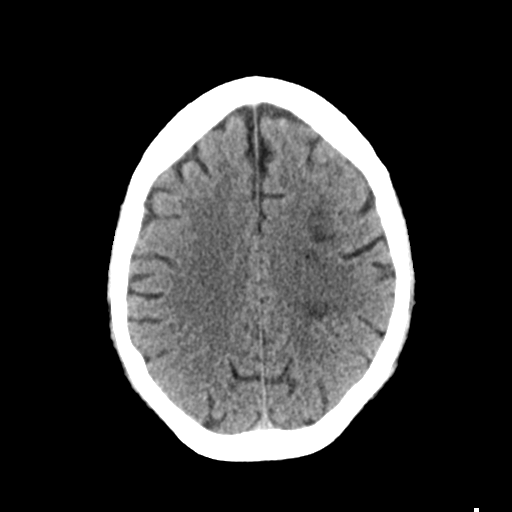
[im 22/31  brain]
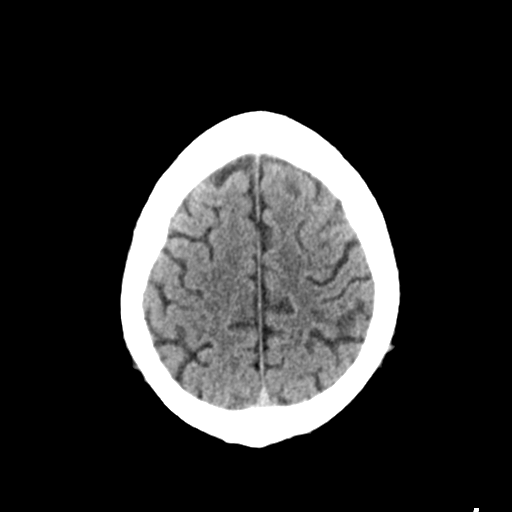
[im 25/31  brain]
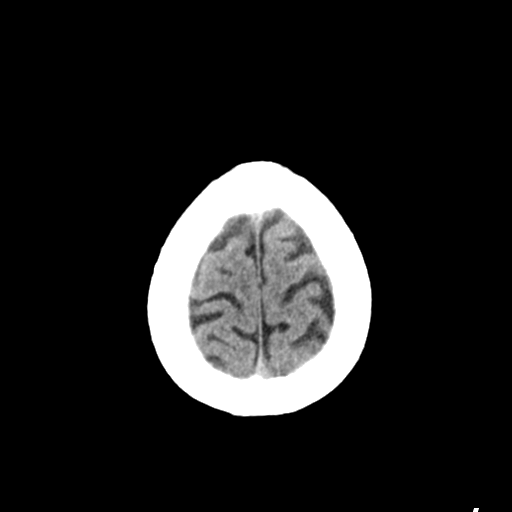
[im 28/31  brain]
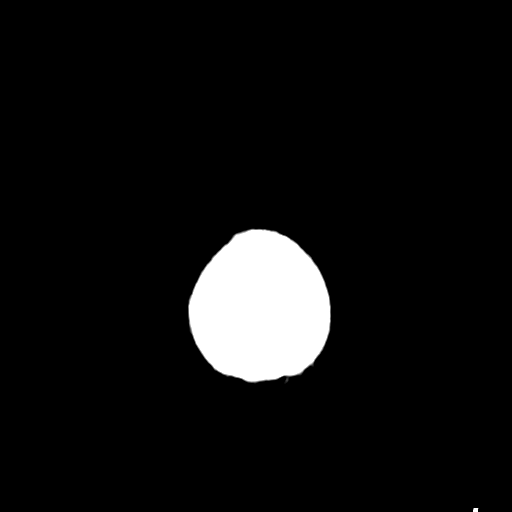
[im 28/31  bone]
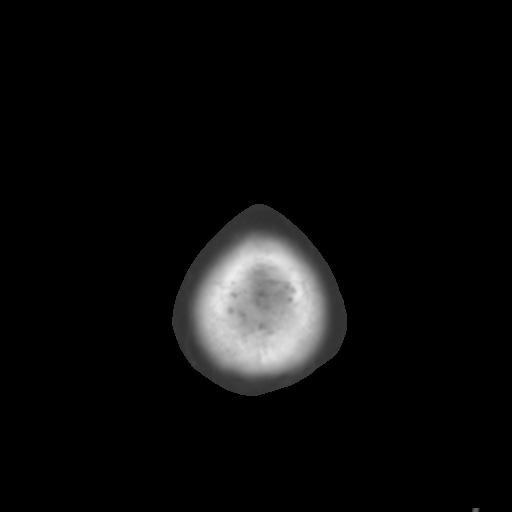

[Series 4: coronal soft tissue · coronal · 0.29mm/px · 3 of 65 slices shown]
[im 22/65  brain]
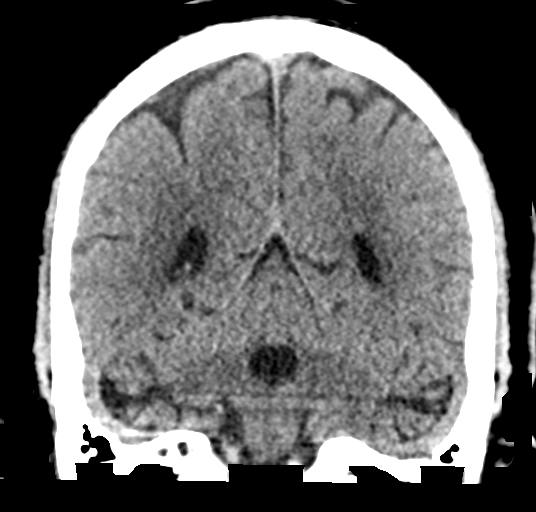
[im 29/65  brain]
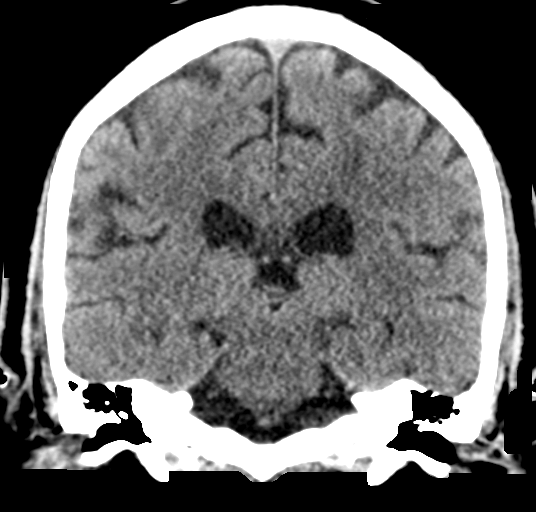
[im 36/65  brain]
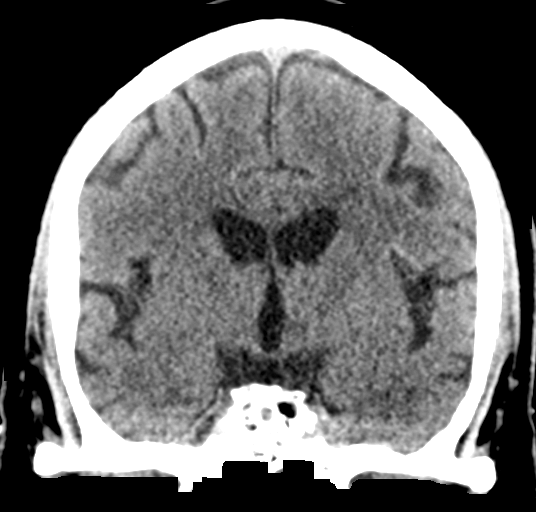

[Series 5: sagittal soft tissue · sagittal · 0.30mm/px · 3 of 50 slices shown]
[im 17/50  brain]
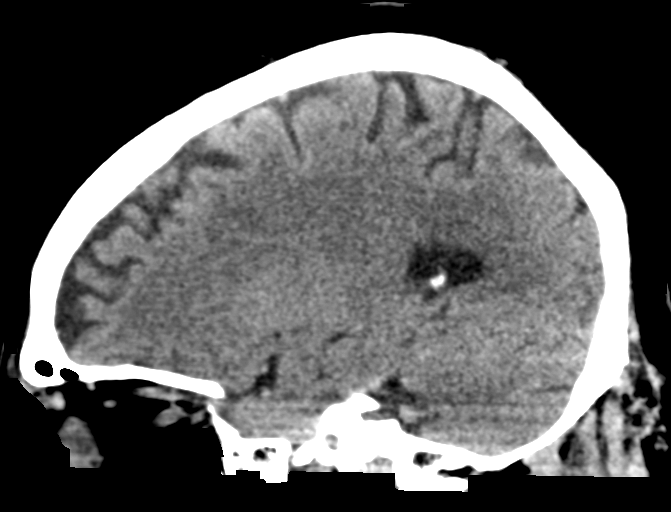
[im 25/50  brain]
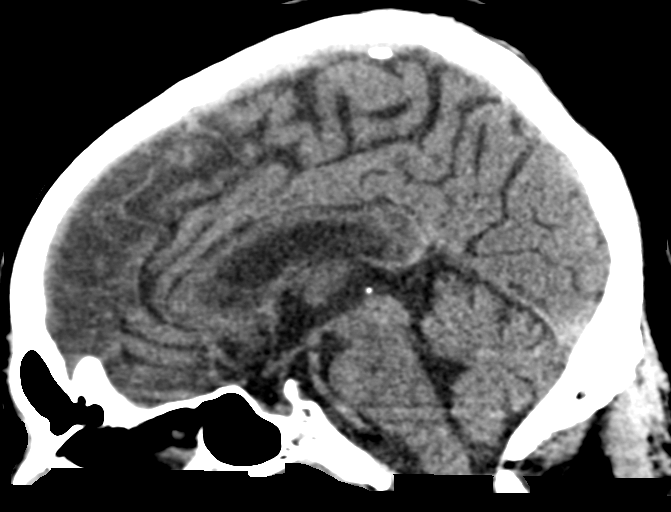
[im 33/50  brain]
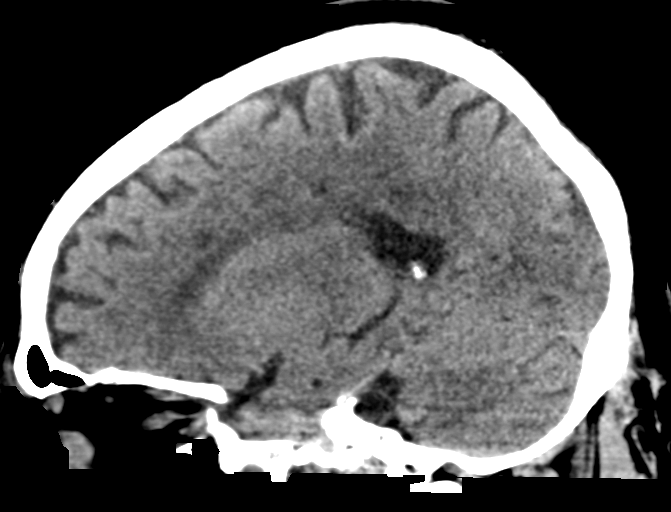

[15 of 47 positions shown; findings below may reference images not displayed]

FINDINGS: Brain: There is no mass, hemorrhage or extra-axial collection. The
size and configuration of the ventricles and extra-axial CSF spaces
are normal. There is hypoattenuation of the white matter, most
commonly indicating chronic small vessel disease. Multifocal
hypoattenuation in the left hemispheric white matter consistent with
known areas of infarct.

Vascular: No abnormal hyperdensity of the major intracranial
arteries or dural venous sinuses. No intracranial atherosclerosis.

Skull: The visualized skull base, calvarium and extracranial soft
tissues are normal.

Sinuses/Orbits: No fluid levels or advanced mucosal thickening of
the visualized paranasal sinuses. No mastoid or middle ear effusion.
The orbits are normal.
IMPRESSION: Chronic ischemic microangiopathy without acute intracranial
abnormality.

## 2020-07-21 IMAGING — DX DG CHEST 1V PORT
1 series · 1 of 1 positions shown · non-contrast
Comparison: Chest x-ray 04/13/2019, 04/04/2019.

CLINICAL DATA: Pulmonary disease.  COPD.

EXAM:
PORTABLE CHEST 1 VIEW

[chest ap]
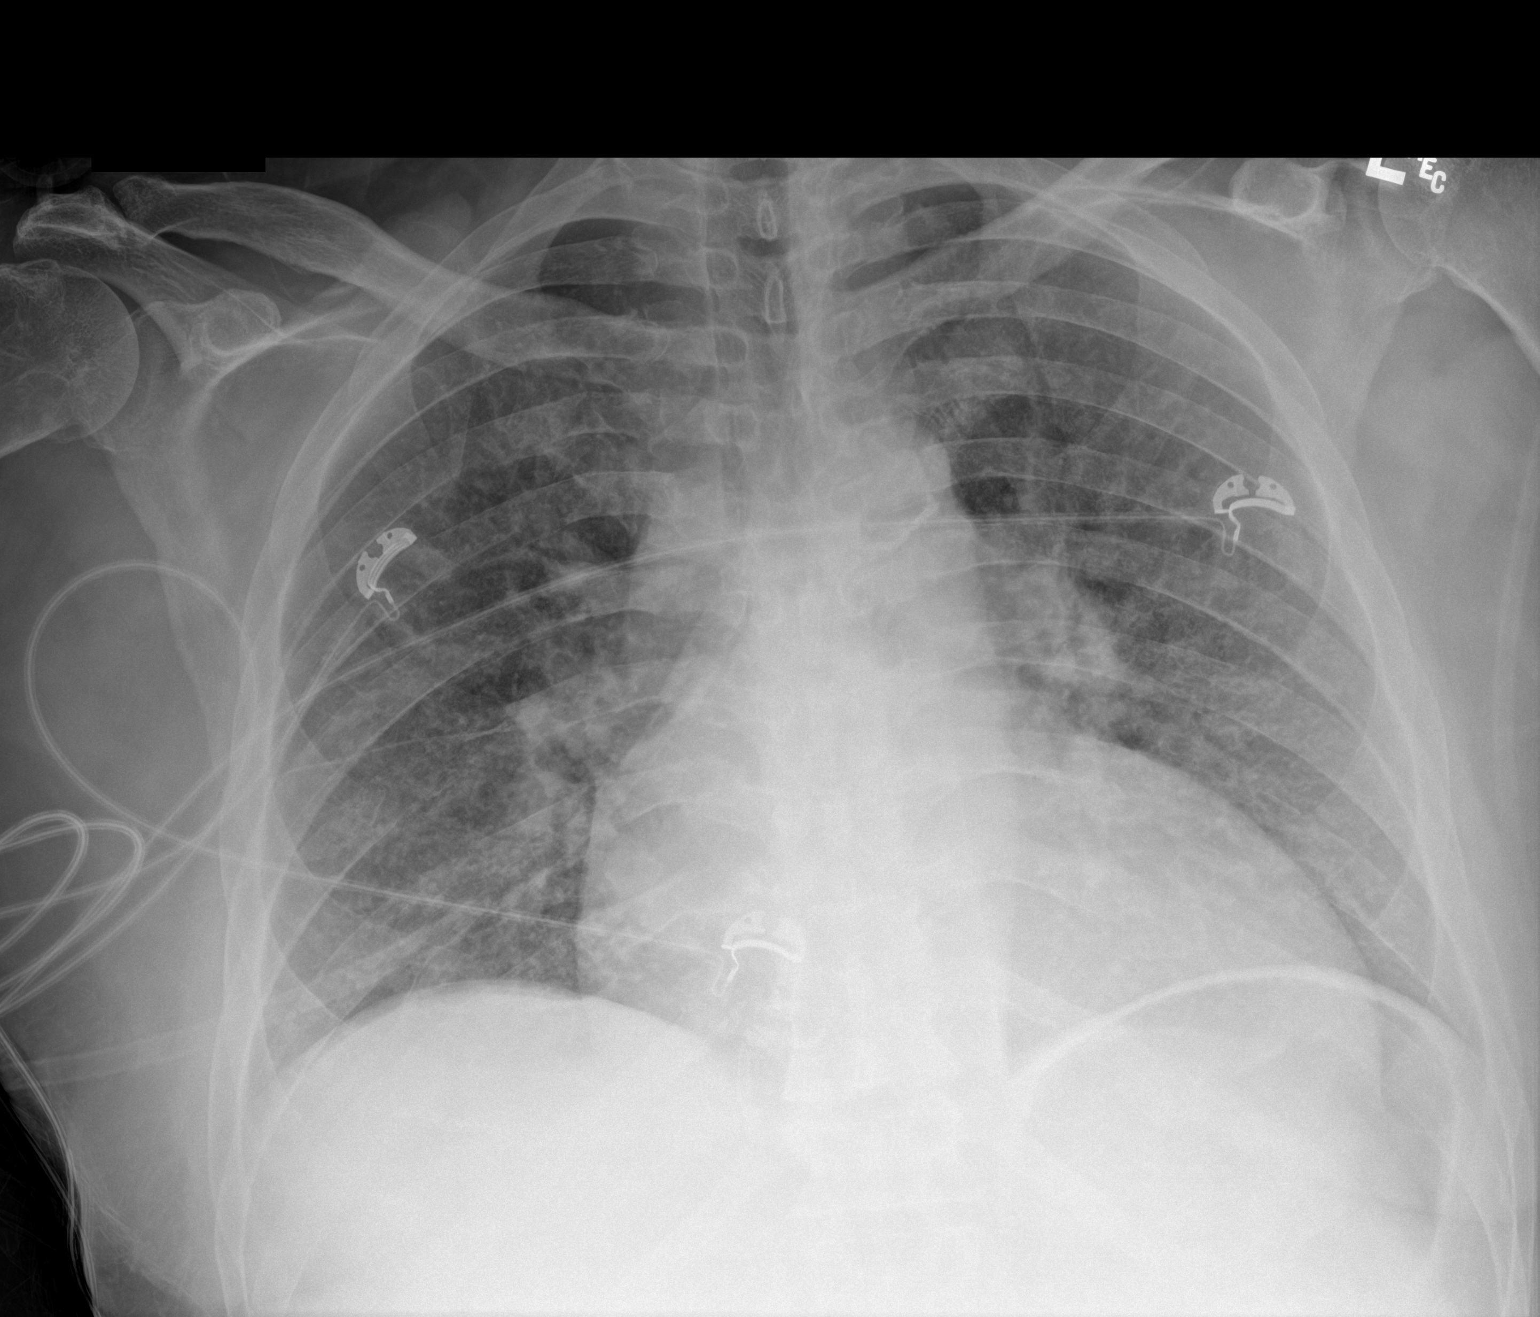

[1 of 1 positions shown; findings below may reference images not displayed]

FINDINGS: Cardiomegaly. Bilateral pulmonary interstitial prominence noted.
Interstitial edema and/or pneumonitis could present this fashion.
Interstitial prominence is increased from prior exam no pleural
effusion or pneumothorax.
IMPRESSION: Cardiomegaly. Diffuse bilateral interstitial prominence.
Interstitial edema and/or pneumonitis could present in this fashion.
Interstitial prominence is increased from prior exam.
